# Patient Record
Sex: Female | Born: 1950 | Race: Black or African American | Hispanic: No | Marital: Single | Smoking: Never smoker
Health system: Southern US, Community
[De-identification: ages and names within clinical notes are randomized; demographics above are authoritative.]

## PROBLEM LIST (undated history)

## (undated) DIAGNOSIS — M5136 Other intervertebral disc degeneration, lumbar region: Secondary | ICD-10-CM

## (undated) DIAGNOSIS — C9000 Multiple myeloma not having achieved remission: Secondary | ICD-10-CM

## (undated) DIAGNOSIS — R238 Other skin changes: Secondary | ICD-10-CM

## (undated) DIAGNOSIS — C3490 Malignant neoplasm of unspecified part of unspecified bronchus or lung: Secondary | ICD-10-CM

## (undated) DIAGNOSIS — M51369 Other intervertebral disc degeneration, lumbar region without mention of lumbar back pain or lower extremity pain: Secondary | ICD-10-CM

## (undated) DIAGNOSIS — S5292XA Unspecified fracture of left forearm, initial encounter for closed fracture: Secondary | ICD-10-CM

## (undated) DIAGNOSIS — R233 Spontaneous ecchymoses: Secondary | ICD-10-CM

## (undated) DIAGNOSIS — K8020 Calculus of gallbladder without cholecystitis without obstruction: Secondary | ICD-10-CM

## (undated) DIAGNOSIS — E860 Dehydration: Secondary | ICD-10-CM

## (undated) DIAGNOSIS — K760 Fatty (change of) liver, not elsewhere classified: Secondary | ICD-10-CM

## (undated) DIAGNOSIS — J382 Nodules of vocal cords: Secondary | ICD-10-CM

## (undated) DIAGNOSIS — Z85038 Personal history of other malignant neoplasm of large intestine: Secondary | ICD-10-CM

## (undated) DIAGNOSIS — K219 Gastro-esophageal reflux disease without esophagitis: Secondary | ICD-10-CM

## (undated) DIAGNOSIS — Z860100 Personal history of colon polyps, unspecified: Secondary | ICD-10-CM

## (undated) DIAGNOSIS — B009 Herpesviral infection, unspecified: Secondary | ICD-10-CM

## (undated) DIAGNOSIS — G63 Polyneuropathy in diseases classified elsewhere: Secondary | ICD-10-CM

## (undated) DIAGNOSIS — E119 Type 2 diabetes mellitus without complications: Secondary | ICD-10-CM

## (undated) DIAGNOSIS — Z9484 Stem cells transplant status: Secondary | ICD-10-CM

## (undated) DIAGNOSIS — I10 Essential (primary) hypertension: Secondary | ICD-10-CM

## (undated) HISTORY — DX: Dehydration: E86.0

## (undated) HISTORY — PX: LIMBAL STEM CELL TRANSPLANT: SHX1969

## (undated) HISTORY — DX: Calculus of gallbladder without cholecystitis without obstruction: K80.20

## (undated) HISTORY — DX: Gastro-esophageal reflux disease without esophagitis: K21.9

## (undated) HISTORY — DX: Fatty (change of) liver, not elsewhere classified: K76.0

## (undated) HISTORY — DX: Herpesviral infection, unspecified: B00.9

## (undated) HISTORY — DX: Essential (primary) hypertension: I10

## (undated) HISTORY — DX: Hypercalcemia: E83.52

## (undated) HISTORY — DX: Unspecified fracture of left forearm, initial encounter for closed fracture: S52.92XA

## (undated) HISTORY — DX: Personal history of other malignant neoplasm of large intestine: Z85.038

## (undated) HISTORY — PX: CERVICAL CONE BIOPSY: SUR198

---

## 1981-12-31 HISTORY — PX: ABDOMINAL HYSTERECTOMY: SHX81

## 2001-05-01 ENCOUNTER — Other Ambulatory Visit: Admission: RE | Admit: 2001-05-01 | Discharge: 2001-05-01 | Payer: Self-pay | Admitting: Internal Medicine

## 2001-05-26 ENCOUNTER — Ambulatory Visit (HOSPITAL_COMMUNITY): Admission: RE | Admit: 2001-05-26 | Discharge: 2001-05-26 | Payer: Self-pay | Admitting: Internal Medicine

## 2001-05-26 ENCOUNTER — Encounter: Payer: Self-pay | Admitting: Internal Medicine

## 2002-01-14 ENCOUNTER — Emergency Department (HOSPITAL_COMMUNITY): Admission: EM | Admit: 2002-01-14 | Discharge: 2002-01-14 | Payer: Self-pay | Admitting: Emergency Medicine

## 2002-01-14 ENCOUNTER — Encounter: Payer: Self-pay | Admitting: Emergency Medicine

## 2002-11-09 ENCOUNTER — Ambulatory Visit (HOSPITAL_COMMUNITY): Admission: RE | Admit: 2002-11-09 | Discharge: 2002-11-09 | Payer: Self-pay | Admitting: Internal Medicine

## 2002-11-09 ENCOUNTER — Encounter: Payer: Self-pay | Admitting: Internal Medicine

## 2004-01-17 ENCOUNTER — Ambulatory Visit (HOSPITAL_COMMUNITY): Admission: RE | Admit: 2004-01-17 | Discharge: 2004-01-17 | Payer: Self-pay | Admitting: Internal Medicine

## 2005-03-01 ENCOUNTER — Ambulatory Visit: Payer: Self-pay | Admitting: Family Medicine

## 2005-03-02 ENCOUNTER — Observation Stay (HOSPITAL_COMMUNITY): Admission: AD | Admit: 2005-03-02 | Discharge: 2005-03-03 | Payer: Self-pay | Admitting: Internal Medicine

## 2005-03-02 ENCOUNTER — Encounter (HOSPITAL_COMMUNITY): Admission: RE | Admit: 2005-03-02 | Discharge: 2005-04-01 | Payer: Self-pay | Admitting: Family Medicine

## 2005-03-02 ENCOUNTER — Ambulatory Visit (HOSPITAL_COMMUNITY): Admission: RE | Admit: 2005-03-02 | Discharge: 2005-03-02 | Payer: Self-pay | Admitting: Family Medicine

## 2005-03-15 ENCOUNTER — Ambulatory Visit: Payer: Self-pay | Admitting: Family Medicine

## 2005-04-02 ENCOUNTER — Ambulatory Visit (HOSPITAL_COMMUNITY): Admission: RE | Admit: 2005-04-02 | Discharge: 2005-04-02 | Payer: Self-pay | Admitting: Internal Medicine

## 2005-04-02 ENCOUNTER — Ambulatory Visit: Payer: Self-pay | Admitting: Internal Medicine

## 2005-04-02 HISTORY — PX: ESOPHAGOGASTRODUODENOSCOPY: SHX1529

## 2005-04-04 ENCOUNTER — Ambulatory Visit (HOSPITAL_COMMUNITY): Admission: RE | Admit: 2005-04-04 | Discharge: 2005-04-04 | Payer: Self-pay | Admitting: Internal Medicine

## 2005-04-11 ENCOUNTER — Encounter: Admission: RE | Admit: 2005-04-11 | Discharge: 2005-04-11 | Payer: Self-pay | Admitting: General Surgery

## 2005-04-19 ENCOUNTER — Ambulatory Visit: Payer: Self-pay | Admitting: Family Medicine

## 2005-04-26 ENCOUNTER — Encounter (INDEPENDENT_AMBULATORY_CARE_PROVIDER_SITE_OTHER): Payer: Self-pay | Admitting: Specialist

## 2005-04-26 ENCOUNTER — Inpatient Hospital Stay (HOSPITAL_COMMUNITY): Admission: RE | Admit: 2005-04-26 | Discharge: 2005-05-02 | Payer: Self-pay | Admitting: General Surgery

## 2005-04-27 HISTORY — PX: HEMICOLECTOMY: SHX854

## 2005-06-25 ENCOUNTER — Ambulatory Visit: Payer: Self-pay | Admitting: Family Medicine

## 2005-06-27 ENCOUNTER — Encounter (HOSPITAL_COMMUNITY): Admission: RE | Admit: 2005-06-27 | Discharge: 2005-07-27 | Payer: Self-pay | Admitting: Family Medicine

## 2005-06-27 ENCOUNTER — Ambulatory Visit (HOSPITAL_COMMUNITY): Payer: Self-pay | Admitting: Family Medicine

## 2005-06-29 ENCOUNTER — Ambulatory Visit (HOSPITAL_COMMUNITY): Admission: RE | Admit: 2005-06-29 | Discharge: 2005-06-29 | Payer: Self-pay | Admitting: Family Medicine

## 2005-07-05 ENCOUNTER — Ambulatory Visit: Payer: Self-pay | Admitting: Hematology and Oncology

## 2005-07-06 ENCOUNTER — Ambulatory Visit: Payer: Self-pay | Admitting: Family Medicine

## 2005-07-27 ENCOUNTER — Encounter (INDEPENDENT_AMBULATORY_CARE_PROVIDER_SITE_OTHER): Payer: Self-pay | Admitting: Specialist

## 2005-07-27 ENCOUNTER — Other Ambulatory Visit: Admission: RE | Admit: 2005-07-27 | Discharge: 2005-07-27 | Payer: Self-pay | Admitting: Hematology and Oncology

## 2005-07-30 ENCOUNTER — Ambulatory Visit: Payer: Self-pay | Admitting: Family Medicine

## 2005-08-03 ENCOUNTER — Encounter (HOSPITAL_COMMUNITY): Admission: RE | Admit: 2005-08-03 | Discharge: 2005-09-26 | Payer: Self-pay | Admitting: Hematology and Oncology

## 2005-08-10 ENCOUNTER — Ambulatory Visit (HOSPITAL_COMMUNITY): Admission: RE | Admit: 2005-08-10 | Discharge: 2005-08-10 | Payer: Self-pay | Admitting: Hematology and Oncology

## 2005-08-22 ENCOUNTER — Ambulatory Visit: Payer: Self-pay | Admitting: Hematology and Oncology

## 2005-09-12 ENCOUNTER — Ambulatory Visit: Payer: Self-pay | Admitting: Family Medicine

## 2005-10-09 ENCOUNTER — Ambulatory Visit: Payer: Self-pay | Admitting: Hematology and Oncology

## 2005-11-25 ENCOUNTER — Ambulatory Visit: Payer: Self-pay | Admitting: Hematology and Oncology

## 2006-01-15 ENCOUNTER — Ambulatory Visit: Payer: Self-pay | Admitting: Family Medicine

## 2006-01-28 ENCOUNTER — Ambulatory Visit: Payer: Self-pay | Admitting: Hematology and Oncology

## 2006-03-04 ENCOUNTER — Ambulatory Visit: Payer: Self-pay | Admitting: Family Medicine

## 2006-03-12 ENCOUNTER — Ambulatory Visit: Payer: Self-pay | Admitting: Hematology and Oncology

## 2006-03-18 ENCOUNTER — Ambulatory Visit: Payer: Self-pay | Admitting: Family Medicine

## 2006-05-08 ENCOUNTER — Ambulatory Visit: Payer: Self-pay | Admitting: Hematology and Oncology

## 2006-05-10 LAB — COMPREHENSIVE METABOLIC PANEL
Albumin: 3.9 g/dL (ref 3.5–5.2)
BUN: 10 mg/dL (ref 6–23)
CO2: 24 mEq/L (ref 19–32)
Calcium: 9.3 mg/dL (ref 8.4–10.5)
Chloride: 103 mEq/L (ref 96–112)
Glucose, Bld: 125 mg/dL — ABNORMAL HIGH (ref 70–99)
Potassium: 3.3 mEq/L — ABNORMAL LOW (ref 3.5–5.3)
Sodium: 138 mEq/L (ref 135–145)
Total Protein: 6.3 g/dL (ref 6.0–8.3)

## 2006-05-10 LAB — CBC WITH DIFFERENTIAL/PLATELET
Basophils Absolute: 0.1 10*3/uL (ref 0.0–0.1)
EOS%: 1.7 % (ref 0.0–7.0)
Eosinophils Absolute: 0.1 10*3/uL (ref 0.0–0.5)
HCT: 28 % — ABNORMAL LOW (ref 34.8–46.6)
HGB: 9.5 g/dL — ABNORMAL LOW (ref 11.6–15.9)
MCH: 30.1 pg (ref 26.0–34.0)
NEUT#: 2.2 10*3/uL (ref 1.5–6.5)
NEUT%: 50.3 % (ref 39.6–76.8)
RDW: 19.9 % — ABNORMAL HIGH (ref 11.3–14.5)
lymph#: 1.1 10*3/uL (ref 0.9–3.3)

## 2006-05-10 LAB — MAGNESIUM: Magnesium: 1.7 mg/dL (ref 1.5–2.5)

## 2006-05-10 LAB — PHOSPHORUS: Phosphorus: 2.9 mg/dL (ref 2.3–4.6)

## 2006-05-15 ENCOUNTER — Ambulatory Visit (HOSPITAL_COMMUNITY): Admission: RE | Admit: 2006-05-15 | Discharge: 2006-05-15 | Payer: Self-pay | Admitting: Hematology and Oncology

## 2006-05-16 ENCOUNTER — Encounter: Admission: RE | Admit: 2006-05-16 | Discharge: 2006-08-14 | Payer: Self-pay | Admitting: Hematology and Oncology

## 2006-05-16 LAB — COMPREHENSIVE METABOLIC PANEL
AST: 14 U/L (ref 0–37)
Albumin: 3.9 g/dL (ref 3.5–5.2)
Alkaline Phosphatase: 41 U/L (ref 39–117)
BUN: 8 mg/dL (ref 6–23)
Calcium: 9.2 mg/dL (ref 8.4–10.5)
Chloride: 102 mEq/L (ref 96–112)
Potassium: 3.4 mEq/L — ABNORMAL LOW (ref 3.5–5.3)
Sodium: 139 mEq/L (ref 135–145)
Total Protein: 6.5 g/dL (ref 6.0–8.3)

## 2006-05-16 LAB — CBC WITH DIFFERENTIAL/PLATELET
Basophils Absolute: 0 10*3/uL (ref 0.0–0.1)
EOS%: 2.8 % (ref 0.0–7.0)
Eosinophils Absolute: 0.2 10*3/uL (ref 0.0–0.5)
HGB: 8.4 g/dL — ABNORMAL LOW (ref 11.6–15.9)
MCH: 31 pg (ref 26.0–34.0)
NEUT#: 3.8 10*3/uL (ref 1.5–6.5)
RBC: 2.71 10*6/uL — ABNORMAL LOW (ref 3.70–5.32)
RDW: 23.8 % — ABNORMAL HIGH (ref 11.3–14.5)
lymph#: 0.8 10*3/uL — ABNORMAL LOW (ref 0.9–3.3)

## 2006-05-23 LAB — CBC WITH DIFFERENTIAL/PLATELET
BASO%: 0.2 % (ref 0.0–2.0)
EOS%: 3 % (ref 0.0–7.0)
Eosinophils Absolute: 0.2 10*3/uL (ref 0.0–0.5)
LYMPH%: 16.6 % (ref 14.0–48.0)
MCH: 30.9 pg (ref 26.0–34.0)
MCHC: 33.8 g/dL (ref 32.0–36.0)
MCV: 91.6 fL (ref 81.0–101.0)
MONO%: 16.8 % — ABNORMAL HIGH (ref 0.0–13.0)
NEUT#: 3.6 10*3/uL (ref 1.5–6.5)
Platelets: 95 10*3/uL — ABNORMAL LOW (ref 145–400)
RBC: 3.59 10*6/uL — ABNORMAL LOW (ref 3.70–5.32)
RDW: 20.4 % — ABNORMAL HIGH (ref 11.3–14.5)

## 2006-05-23 LAB — COMPREHENSIVE METABOLIC PANEL
ALT: 19 U/L (ref 0–40)
AST: 20 U/L (ref 0–37)
Albumin: 3.9 g/dL (ref 3.5–5.2)
Alkaline Phosphatase: 42 U/L (ref 39–117)
Glucose, Bld: 138 mg/dL — ABNORMAL HIGH (ref 70–99)
Potassium: 3.2 mEq/L — ABNORMAL LOW (ref 3.5–5.3)
Sodium: 140 mEq/L (ref 135–145)
Total Bilirubin: 1 mg/dL (ref 0.3–1.2)
Total Protein: 6.4 g/dL (ref 6.0–8.3)

## 2006-05-23 LAB — PHOSPHORUS: Phosphorus: 2.5 mg/dL (ref 2.3–4.6)

## 2006-06-06 LAB — CBC WITH DIFFERENTIAL/PLATELET
BASO%: 0.3 % (ref 0.0–2.0)
EOS%: 2.1 % (ref 0.0–7.0)
HCT: 33.4 % — ABNORMAL LOW (ref 34.8–46.6)
MCH: 31.9 pg (ref 26.0–34.0)
MCHC: 33.7 g/dL (ref 32.0–36.0)
MONO#: 1 10*3/uL — ABNORMAL HIGH (ref 0.1–0.9)
NEUT%: 74.6 % (ref 39.6–76.8)
RDW: 22.2 % — ABNORMAL HIGH (ref 11.3–14.5)
WBC: 9 10*3/uL (ref 3.9–10.0)
lymph#: 1 10*3/uL (ref 0.9–3.3)

## 2006-06-06 LAB — COMPREHENSIVE METABOLIC PANEL
ALT: 18 U/L (ref 0–40)
AST: 20 U/L (ref 0–37)
Albumin: 4.3 g/dL (ref 3.5–5.2)
CO2: 26 mEq/L (ref 19–32)
Calcium: 9.2 mg/dL (ref 8.4–10.5)
Chloride: 105 mEq/L (ref 96–112)
Potassium: 3.5 mEq/L (ref 3.5–5.3)
Sodium: 142 mEq/L (ref 135–145)
Total Protein: 6.6 g/dL (ref 6.0–8.3)

## 2006-06-11 ENCOUNTER — Ambulatory Visit: Payer: Self-pay | Admitting: Family Medicine

## 2006-06-18 ENCOUNTER — Ambulatory Visit: Payer: Self-pay | Admitting: Hematology and Oncology

## 2006-06-20 LAB — COMPREHENSIVE METABOLIC PANEL
Alkaline Phosphatase: 36 U/L — ABNORMAL LOW (ref 39–117)
BUN: 10 mg/dL (ref 6–23)
Creatinine, Ser: 0.74 mg/dL (ref 0.40–1.20)
Glucose, Bld: 115 mg/dL — ABNORMAL HIGH (ref 70–99)
Sodium: 140 mEq/L (ref 135–145)
Total Bilirubin: 1.1 mg/dL (ref 0.3–1.2)
Total Protein: 6.5 g/dL (ref 6.0–8.3)

## 2006-06-20 LAB — PHOSPHORUS: Phosphorus: 3 mg/dL (ref 2.3–4.6)

## 2006-06-20 LAB — CBC WITH DIFFERENTIAL/PLATELET
Eosinophils Absolute: 0.2 10*3/uL (ref 0.0–0.5)
LYMPH%: 14.1 % (ref 14.0–48.0)
MCV: 94.7 fL (ref 81.0–101.0)
MONO%: 10 % (ref 0.0–13.0)
NEUT#: 5.9 10*3/uL (ref 1.5–6.5)
Platelets: 140 10*3/uL — ABNORMAL LOW (ref 145–400)
RBC: 3.61 10*6/uL — ABNORMAL LOW (ref 3.70–5.32)

## 2006-07-01 ENCOUNTER — Ambulatory Visit (HOSPITAL_COMMUNITY): Admission: RE | Admit: 2006-07-01 | Discharge: 2006-07-01 | Payer: Self-pay | Admitting: Family Medicine

## 2006-08-20 ENCOUNTER — Ambulatory Visit: Payer: Self-pay | Admitting: Hematology and Oncology

## 2006-08-26 LAB — CBC WITH DIFFERENTIAL/PLATELET
BASO%: 0.1 % (ref 0.0–2.0)
LYMPH%: 14.6 % (ref 14.0–48.0)
MCHC: 34.5 g/dL (ref 32.0–36.0)
MONO#: 0.7 10*3/uL (ref 0.1–0.9)
Platelets: 153 10*3/uL (ref 145–400)
RBC: 3.34 10*6/uL — ABNORMAL LOW (ref 3.70–5.32)
RDW: 14.1 % (ref 11.3–14.5)
WBC: 8.3 10*3/uL (ref 3.9–10.0)

## 2006-08-28 LAB — COMPREHENSIVE METABOLIC PANEL
AST: 18 U/L (ref 0–37)
Albumin: 4.3 g/dL (ref 3.5–5.2)
Alkaline Phosphatase: 47 U/L (ref 39–117)
BUN: 9 mg/dL (ref 6–23)
Potassium: 3.4 mEq/L — ABNORMAL LOW (ref 3.5–5.3)
Sodium: 142 mEq/L (ref 135–145)
Total Bilirubin: 0.9 mg/dL (ref 0.3–1.2)
Total Protein: 6.5 g/dL (ref 6.0–8.3)

## 2006-08-28 LAB — IMMUNOFIXATION ELECTROPHORESIS
IgG (Immunoglobin G), Serum: 587 mg/dL — ABNORMAL LOW (ref 694–1618)
IgM, Serum: 37 mg/dL — ABNORMAL LOW (ref 60–263)

## 2006-09-16 ENCOUNTER — Ambulatory Visit: Payer: Self-pay | Admitting: Family Medicine

## 2006-09-16 ENCOUNTER — Other Ambulatory Visit: Admission: RE | Admit: 2006-09-16 | Discharge: 2006-09-16 | Payer: Self-pay | Admitting: Family Medicine

## 2006-10-07 ENCOUNTER — Ambulatory Visit: Payer: Self-pay | Admitting: Internal Medicine

## 2006-10-07 ENCOUNTER — Ambulatory Visit (HOSPITAL_COMMUNITY): Admission: RE | Admit: 2006-10-07 | Discharge: 2006-10-07 | Payer: Self-pay | Admitting: Internal Medicine

## 2006-10-07 HISTORY — PX: COLONOSCOPY: SHX174

## 2006-10-07 LAB — HM COLONOSCOPY: HM Colonoscopy: NORMAL

## 2006-11-19 ENCOUNTER — Ambulatory Visit: Payer: Self-pay | Admitting: Hematology and Oncology

## 2006-11-19 LAB — COMPREHENSIVE METABOLIC PANEL
ALT: 21 U/L (ref 0–35)
Alkaline Phosphatase: 51 U/L (ref 39–117)
CO2: 30 mEq/L (ref 19–32)
Creatinine, Ser: 0.9 mg/dL (ref 0.40–1.20)
Sodium: 140 mEq/L (ref 135–145)
Total Bilirubin: 0.7 mg/dL (ref 0.3–1.2)
Total Protein: 6.6 g/dL (ref 6.0–8.3)

## 2006-11-19 LAB — CBC WITH DIFFERENTIAL/PLATELET
BASO%: 0.1 % (ref 0.0–2.0)
EOS%: 1.3 % (ref 0.0–7.0)
Eosinophils Absolute: 0.1 10*3/uL (ref 0.0–0.5)
LYMPH%: 17.6 % (ref 14.0–48.0)
MCH: 34.8 pg — ABNORMAL HIGH (ref 26.0–34.0)
MCHC: 34.3 g/dL (ref 32.0–36.0)
MCV: 101.4 fL — ABNORMAL HIGH (ref 81.0–101.0)
MONO%: 7.1 % (ref 0.0–13.0)
Platelets: 185 10*3/uL (ref 145–400)
RBC: 3.41 10*6/uL — ABNORMAL LOW (ref 3.70–5.32)

## 2006-11-20 ENCOUNTER — Ambulatory Visit: Payer: Self-pay | Admitting: Family Medicine

## 2006-11-27 ENCOUNTER — Ambulatory Visit (HOSPITAL_COMMUNITY): Admission: RE | Admit: 2006-11-27 | Discharge: 2006-11-27 | Payer: Self-pay | Admitting: Hematology and Oncology

## 2006-11-29 LAB — CBC WITH DIFFERENTIAL/PLATELET
Eosinophils Absolute: 0.1 10*3/uL (ref 0.0–0.5)
HCT: 36.3 % (ref 34.8–46.6)
LYMPH%: 22.3 % (ref 14.0–48.0)
MCHC: 34 g/dL (ref 32.0–36.0)
MCV: 102 fL — ABNORMAL HIGH (ref 81.0–101.0)
MONO#: 0.6 10*3/uL (ref 0.1–0.9)
MONO%: 6.9 % (ref 0.0–13.0)
NEUT#: 6.2 10*3/uL (ref 1.5–6.5)
NEUT%: 69 % (ref 39.6–76.8)
Platelets: 177 10*3/uL (ref 145–400)
WBC: 9 10*3/uL (ref 3.9–10.0)

## 2006-12-04 LAB — BETA 2 MICROGLOBULIN, SERUM: Beta-2 Microglobulin: 1.52 mg/L (ref 1.01–1.73)

## 2006-12-04 LAB — SPEP & IFE WITH QIG
Beta Globulin: 6.1 % (ref 4.7–7.2)
IgA: 249 mg/dL (ref 68–378)
IgG (Immunoglobin G), Serum: 780 mg/dL (ref 694–1618)
IgM, Serum: 56 mg/dL — ABNORMAL LOW (ref 60–263)
Total Protein, Serum Electrophoresis: 6.7 g/dL (ref 6.0–8.3)

## 2006-12-04 LAB — COMPREHENSIVE METABOLIC PANEL
Alkaline Phosphatase: 51 U/L (ref 39–117)
BUN: 18 mg/dL (ref 6–23)
CO2: 30 mEq/L (ref 19–32)
Creatinine, Ser: 0.78 mg/dL (ref 0.40–1.20)
Glucose, Bld: 96 mg/dL (ref 70–99)
Total Bilirubin: 0.7 mg/dL (ref 0.3–1.2)

## 2007-01-09 ENCOUNTER — Ambulatory Visit: Payer: Self-pay | Admitting: Family Medicine

## 2007-01-21 ENCOUNTER — Ambulatory Visit (HOSPITAL_COMMUNITY): Admission: RE | Admit: 2007-01-21 | Discharge: 2007-01-21 | Payer: Self-pay | Admitting: Family Medicine

## 2007-01-21 ENCOUNTER — Ambulatory Visit: Payer: Self-pay | Admitting: Family Medicine

## 2007-01-21 LAB — CONVERTED CEMR LAB
Basophils Relative: 0 % (ref 0–1)
Eosinophils Absolute: 0.2 10*3/uL (ref 0.0–0.7)
HCT: 39.7 % (ref 36.0–46.0)
Hemoglobin: 12.8 g/dL (ref 12.0–15.0)
Lymphs Abs: 2.8 10*3/uL (ref 0.7–3.3)
MCHC: 32.2 g/dL (ref 30.0–36.0)
MCV: 101.8 fL — ABNORMAL HIGH (ref 78.0–100.0)
Monocytes Absolute: 0.6 10*3/uL (ref 0.2–0.7)
Monocytes Relative: 6 % (ref 3–11)
Neutrophils Relative %: 61 % (ref 43–77)
RBC: 3.9 M/uL (ref 3.87–5.11)
WBC: 9.2 10*3/uL (ref 4.0–10.5)

## 2007-03-11 ENCOUNTER — Ambulatory Visit: Payer: Self-pay | Admitting: Hematology and Oncology

## 2007-03-14 LAB — CBC WITH DIFFERENTIAL/PLATELET
BASO%: 0.4 % (ref 0.0–2.0)
LYMPH%: 20.4 % (ref 14.0–48.0)
MCHC: 33.8 g/dL (ref 32.0–36.0)
MCV: 101 fL (ref 81.0–101.0)
MONO#: 0.6 10*3/uL (ref 0.1–0.9)
MONO%: 6.2 % (ref 0.0–13.0)
Platelets: 177 10*3/uL (ref 145–400)
RBC: 3.64 10*6/uL — ABNORMAL LOW (ref 3.70–5.32)
RDW: 13.5 % (ref 11.3–14.5)
WBC: 8.9 10*3/uL (ref 3.9–10.0)

## 2007-03-18 LAB — SPEP & IFE WITH QIG
Beta 2: 3.7 % (ref 3.2–6.5)
Beta Globulin: 11.3 % — ABNORMAL HIGH (ref 4.7–7.2)
IgA: 515 mg/dL — ABNORMAL HIGH (ref 68–378)
IgG (Immunoglobin G), Serum: 901 mg/dL (ref 694–1618)
IgM, Serum: 50 mg/dL — ABNORMAL LOW (ref 60–263)
M-Spike, %: 0.28 g/dL
Total Protein, Serum Electrophoresis: 7.1 g/dL (ref 6.0–8.3)

## 2007-03-18 LAB — COMPREHENSIVE METABOLIC PANEL
ALT: 25 U/L (ref 0–35)
Alkaline Phosphatase: 63 U/L (ref 39–117)
Sodium: 140 mEq/L (ref 135–145)
Total Bilirubin: 1 mg/dL (ref 0.3–1.2)
Total Protein: 7.1 g/dL (ref 6.0–8.3)

## 2007-03-20 ENCOUNTER — Ambulatory Visit: Payer: Self-pay | Admitting: Family Medicine

## 2007-03-24 ENCOUNTER — Ambulatory Visit (HOSPITAL_COMMUNITY): Admission: RE | Admit: 2007-03-24 | Discharge: 2007-03-24 | Payer: Self-pay | Admitting: Family Medicine

## 2007-04-04 ENCOUNTER — Ambulatory Visit: Payer: Self-pay | Admitting: Family Medicine

## 2007-05-05 ENCOUNTER — Ambulatory Visit: Payer: Self-pay | Admitting: Family Medicine

## 2007-06-23 ENCOUNTER — Encounter: Payer: Self-pay | Admitting: Family Medicine

## 2007-06-23 LAB — CONVERTED CEMR LAB
BUN: 17 mg/dL (ref 6–23)
Cholesterol: 169 mg/dL (ref 0–200)
Creatinine, Ser: 0.71 mg/dL (ref 0.40–1.20)
Glucose, Bld: 100 mg/dL — ABNORMAL HIGH (ref 70–99)
LDL Cholesterol: 92 mg/dL (ref 0–99)
Potassium: 4.1 meq/L (ref 3.5–5.3)
Triglycerides: 129 mg/dL (ref <150)
VLDL: 26 mg/dL (ref 0–40)

## 2007-07-03 ENCOUNTER — Ambulatory Visit (HOSPITAL_COMMUNITY): Admission: RE | Admit: 2007-07-03 | Discharge: 2007-07-03 | Payer: Self-pay | Admitting: Family Medicine

## 2007-08-13 ENCOUNTER — Ambulatory Visit: Payer: Self-pay | Admitting: Hematology and Oncology

## 2007-08-15 LAB — CBC WITH DIFFERENTIAL/PLATELET
BASO%: 0.2 % (ref 0.0–2.0)
EOS%: 1 % (ref 0.0–7.0)
LYMPH%: 19.9 % (ref 14.0–48.0)
MCH: 34.8 pg — ABNORMAL HIGH (ref 26.0–34.0)
MCHC: 34.6 g/dL (ref 32.0–36.0)
MCV: 100.6 fL (ref 81.0–101.0)
MONO%: 5.7 % (ref 0.0–13.0)
Platelets: 173 10*3/uL (ref 145–400)
RBC: 3.42 10*6/uL — ABNORMAL LOW (ref 3.70–5.32)

## 2007-08-19 LAB — PROTEIN ELECTROPHORESIS, SERUM
Alpha-2-Globulin: 8.6 % (ref 7.1–11.8)
Gamma Globulin: 7.9 % — ABNORMAL LOW (ref 11.1–18.8)
M-Spike, %: 1.53 g/dL

## 2007-08-19 LAB — IGG, IGA, IGM: IgG (Immunoglobin G), Serum: 707 mg/dL (ref 694–1618)

## 2007-08-19 LAB — LACTATE DEHYDROGENASE: LDH: 150 U/L (ref 94–250)

## 2007-09-05 ENCOUNTER — Ambulatory Visit (HOSPITAL_COMMUNITY): Admission: RE | Admit: 2007-09-05 | Discharge: 2007-09-05 | Payer: Self-pay | Admitting: Hematology and Oncology

## 2007-09-22 ENCOUNTER — Ambulatory Visit: Payer: Self-pay | Admitting: Hematology and Oncology

## 2007-09-22 LAB — CBC WITH DIFFERENTIAL/PLATELET
BASO%: 0 % (ref 0.0–2.0)
LYMPH%: 20.2 % (ref 14.0–48.0)
MCHC: 34.5 g/dL (ref 32.0–36.0)
MCV: 99.3 fL (ref 81.0–101.0)
MONO%: 6.5 % (ref 0.0–13.0)
Platelets: 140 10*3/uL — ABNORMAL LOW (ref 145–400)
RBC: 3.19 10*6/uL — ABNORMAL LOW (ref 3.70–5.32)
WBC: 8.4 10*3/uL (ref 3.9–10.0)

## 2007-09-23 ENCOUNTER — Ambulatory Visit: Payer: Self-pay | Admitting: Family Medicine

## 2007-09-23 LAB — IGG, IGA, IGM: IgG (Immunoglobin G), Serum: 647 mg/dL — ABNORMAL LOW (ref 694–1618)

## 2007-09-30 LAB — UIFE/LIGHT CHAINS/TP QN, 24-HR UR
Free Kappa Lt Chains,Ur: 0.19 mg/dL (ref 0.04–1.51)
Free Lambda Lt Chains,Ur: <0.08 mg/dL (ref 0.08–1.01)
Time: 24 hours
Volume, Urine: 2600 mL

## 2007-10-07 ENCOUNTER — Other Ambulatory Visit: Admission: RE | Admit: 2007-10-07 | Discharge: 2007-10-07 | Payer: Self-pay | Admitting: Hematology and Oncology

## 2007-10-07 ENCOUNTER — Encounter: Payer: Self-pay | Admitting: Hematology and Oncology

## 2007-10-07 LAB — CBC WITH DIFFERENTIAL/PLATELET
Eosinophils Absolute: 0.1 10*3/uL (ref 0.0–0.5)
HCT: 30.9 % — ABNORMAL LOW (ref 34.8–46.6)
LYMPH%: 25.1 % (ref 14.0–48.0)
MCHC: 34 g/dL (ref 32.0–36.0)
MCV: 99.8 fL (ref 81.0–101.0)
MONO%: 7 % (ref 0.0–13.0)
NEUT#: 5.2 10*3/uL (ref 1.5–6.5)
NEUT%: 66.2 % (ref 39.6–76.8)
Platelets: 143 10*3/uL — ABNORMAL LOW (ref 145–400)
RBC: 3.1 10*6/uL — ABNORMAL LOW (ref 3.70–5.32)

## 2007-10-15 ENCOUNTER — Ambulatory Visit: Payer: Self-pay | Admitting: Family Medicine

## 2007-10-15 ENCOUNTER — Other Ambulatory Visit: Admission: RE | Admit: 2007-10-15 | Discharge: 2007-10-15 | Payer: Self-pay | Admitting: Family Medicine

## 2007-10-16 ENCOUNTER — Encounter: Payer: Self-pay | Admitting: Family Medicine

## 2007-10-27 ENCOUNTER — Ambulatory Visit (HOSPITAL_COMMUNITY): Admission: RE | Admit: 2007-10-27 | Discharge: 2007-10-27 | Payer: Self-pay | Admitting: Hematology and Oncology

## 2007-11-07 ENCOUNTER — Ambulatory Visit: Payer: Self-pay | Admitting: Hematology and Oncology

## 2007-11-11 LAB — COMPREHENSIVE METABOLIC PANEL
ALT: 36 U/L — ABNORMAL HIGH (ref 0–35)
AST: 24 U/L (ref 0–37)
Alkaline Phosphatase: 98 U/L (ref 39–117)
Creatinine, Ser: 0.85 mg/dL (ref 0.40–1.20)
Sodium: 139 mEq/L (ref 135–145)
Total Bilirubin: 0.7 mg/dL (ref 0.3–1.2)
Total Protein: 8.4 g/dL — ABNORMAL HIGH (ref 6.0–8.3)

## 2007-11-11 LAB — CBC WITH DIFFERENTIAL/PLATELET
BASO%: 0.1 % (ref 0.0–2.0)
EOS%: 0.8 % (ref 0.0–7.0)
HCT: 28.8 % — ABNORMAL LOW (ref 34.8–46.6)
LYMPH%: 12.8 % — ABNORMAL LOW (ref 14.0–48.0)
MCH: 33 pg (ref 26.0–34.0)
MCHC: 33.6 g/dL (ref 32.0–36.0)
MCV: 98.2 fL (ref 81.0–101.0)
MONO#: 0.3 10*3/uL (ref 0.1–0.9)
MONO%: 2.7 % (ref 0.0–13.0)
NEUT%: 83.6 % — ABNORMAL HIGH (ref 39.6–76.8)
Platelets: 124 10*3/uL — ABNORMAL LOW (ref 145–400)
RBC: 2.93 10*6/uL — ABNORMAL LOW (ref 3.70–5.32)
WBC: 12.4 10*3/uL — ABNORMAL HIGH (ref 3.9–10.0)

## 2007-11-14 LAB — CBC WITH DIFFERENTIAL/PLATELET
BASO%: 0.7 % (ref 0.0–2.0)
HCT: 28.8 % — ABNORMAL LOW (ref 34.8–46.6)
LYMPH%: 20.3 % (ref 14.0–48.0)
MCHC: 34.3 g/dL (ref 32.0–36.0)
MCV: 99.5 fL (ref 81.0–101.0)
MONO#: 0.6 10*3/uL (ref 0.1–0.9)
MONO%: 4.5 % (ref 0.0–13.0)
NEUT%: 74.3 % (ref 39.6–76.8)
Platelets: 140 10*3/uL — ABNORMAL LOW (ref 145–400)
RBC: 2.9 10*6/uL — ABNORMAL LOW (ref 3.70–5.32)
WBC: 14 10*3/uL — ABNORMAL HIGH (ref 3.9–10.0)

## 2007-11-14 LAB — TECHNOLOGIST REVIEW

## 2007-11-18 LAB — CBC WITH DIFFERENTIAL/PLATELET
BASO%: 0.3 % (ref 0.0–2.0)
Basophils Absolute: 0.1 10*3/uL (ref 0.0–0.1)
Eosinophils Absolute: 0.1 10*3/uL (ref 0.0–0.5)
HCT: 30.7 % — ABNORMAL LOW (ref 34.8–46.6)
HGB: 10.3 g/dL — ABNORMAL LOW (ref 11.6–15.9)
LYMPH%: 14 % (ref 14.0–48.0)
MCHC: 33.5 g/dL (ref 32.0–36.0)
MONO#: 0.3 10*3/uL (ref 0.1–0.9)
NEUT#: 12.5 10*3/uL — ABNORMAL HIGH (ref 1.5–6.5)
NEUT%: 83.4 % — ABNORMAL HIGH (ref 39.6–76.8)
Platelets: 95 10*3/uL — ABNORMAL LOW (ref 145–400)
WBC: 15 10*3/uL — ABNORMAL HIGH (ref 3.9–10.0)
lymph#: 2.1 10*3/uL (ref 0.9–3.3)

## 2007-11-21 LAB — CBC WITH DIFFERENTIAL/PLATELET
Basophils Absolute: 0.6 10*3/uL — ABNORMAL HIGH (ref 0.0–0.1)
Eosinophils Absolute: 0 10*3/uL (ref 0.0–0.5)
HCT: 31.3 % — ABNORMAL LOW (ref 34.8–46.6)
HGB: 10.6 g/dL — ABNORMAL LOW (ref 11.6–15.9)
MCH: 34 pg (ref 26.0–34.0)
MCV: 100 fL (ref 81.0–101.0)
MONO%: 2.1 % (ref 0.0–13.0)
NEUT#: 14.1 10*3/uL — ABNORMAL HIGH (ref 1.5–6.5)
NEUT%: 83.3 % — ABNORMAL HIGH (ref 39.6–76.8)
RDW: 16.4 % — ABNORMAL HIGH (ref 11.3–14.5)

## 2007-12-02 LAB — CBC WITH DIFFERENTIAL/PLATELET
Basophils Absolute: 0 10*3/uL (ref 0.0–0.1)
EOS%: 0.5 % (ref 0.0–7.0)
Eosinophils Absolute: 0.1 10*3/uL (ref 0.0–0.5)
HGB: 9.8 g/dL — ABNORMAL LOW (ref 11.6–15.9)
LYMPH%: 12 % — ABNORMAL LOW (ref 14.0–48.0)
MCH: 33 pg (ref 26.0–34.0)
MCV: 98.4 fL (ref 81.0–101.0)
MONO%: 1.9 % (ref 0.0–13.0)
NEUT#: 10.2 10*3/uL — ABNORMAL HIGH (ref 1.5–6.5)
Platelets: 122 10*3/uL — ABNORMAL LOW (ref 145–400)
RBC: 2.97 10*6/uL — ABNORMAL LOW (ref 3.70–5.32)

## 2007-12-02 LAB — COMPREHENSIVE METABOLIC PANEL
Alkaline Phosphatase: 78 U/L (ref 39–117)
BUN: 17 mg/dL (ref 6–23)
Glucose, Bld: 177 mg/dL — ABNORMAL HIGH (ref 70–99)
Total Bilirubin: 0.5 mg/dL (ref 0.3–1.2)

## 2007-12-05 LAB — CBC WITH DIFFERENTIAL/PLATELET
BASO%: 0.6 % (ref 0.0–2.0)
EOS%: 0.4 % (ref 0.0–7.0)
HCT: 29.9 % — ABNORMAL LOW (ref 34.8–46.6)
MCH: 33.3 pg (ref 26.0–34.0)
MCHC: 33.7 g/dL (ref 32.0–36.0)
NEUT%: 79 % — ABNORMAL HIGH (ref 39.6–76.8)
RBC: 3.03 10*6/uL — ABNORMAL LOW (ref 3.70–5.32)
lymph#: 2.1 10*3/uL (ref 0.9–3.3)

## 2007-12-05 LAB — TECHNOLOGIST REVIEW

## 2007-12-09 LAB — CBC WITH DIFFERENTIAL/PLATELET
EOS%: 0.4 % (ref 0.0–7.0)
Eosinophils Absolute: 0 10*3/uL (ref 0.0–0.5)
MCH: 34 pg (ref 26.0–34.0)
MCV: 100.8 fL (ref 81.0–101.0)
MONO%: 0.9 % (ref 0.0–13.0)
NEUT#: 10.4 10*3/uL — ABNORMAL HIGH (ref 1.5–6.5)
RBC: 2.98 10*6/uL — ABNORMAL LOW (ref 3.70–5.32)
RDW: 20.1 % — ABNORMAL HIGH (ref 11.3–14.5)

## 2007-12-12 LAB — CBC WITH DIFFERENTIAL/PLATELET
Eosinophils Absolute: 0.1 10*3/uL (ref 0.0–0.5)
MONO#: 0.4 10*3/uL (ref 0.1–0.9)
NEUT#: 9.1 10*3/uL — ABNORMAL HIGH (ref 1.5–6.5)
RBC: 3 10*6/uL — ABNORMAL LOW (ref 3.70–5.32)
RDW: 16.4 % — ABNORMAL HIGH (ref 11.3–14.5)
WBC: 11 10*3/uL — ABNORMAL HIGH (ref 3.9–10.0)
lymph#: 1.4 10*3/uL (ref 0.9–3.3)

## 2007-12-19 ENCOUNTER — Ambulatory Visit: Payer: Self-pay | Admitting: Hematology and Oncology

## 2007-12-23 LAB — CBC WITH DIFFERENTIAL/PLATELET
Eosinophils Absolute: 0 10*3/uL (ref 0.0–0.5)
HCT: 31.6 % — ABNORMAL LOW (ref 34.8–46.6)
LYMPH%: 8.9 % — ABNORMAL LOW (ref 14.0–48.0)
MONO#: 0.3 10*3/uL (ref 0.1–0.9)
NEUT#: 13.1 10*3/uL — ABNORMAL HIGH (ref 1.5–6.5)
NEUT%: 88.4 % — ABNORMAL HIGH (ref 39.6–76.8)
Platelets: 116 10*3/uL — ABNORMAL LOW (ref 145–400)
RBC: 3.1 10*6/uL — ABNORMAL LOW (ref 3.70–5.32)
WBC: 14.8 10*3/uL — ABNORMAL HIGH (ref 3.9–10.0)

## 2007-12-23 LAB — COMPREHENSIVE METABOLIC PANEL
CO2: 23 mEq/L (ref 19–32)
Calcium: 9.2 mg/dL (ref 8.4–10.5)
Glucose, Bld: 159 mg/dL — ABNORMAL HIGH (ref 70–99)
Sodium: 141 mEq/L (ref 135–145)
Total Bilirubin: 0.6 mg/dL (ref 0.3–1.2)
Total Protein: 6.8 g/dL (ref 6.0–8.3)

## 2007-12-26 LAB — CBC WITH DIFFERENTIAL/PLATELET
BASO%: 0 % (ref 0.0–2.0)
Eosinophils Absolute: 0 10*3/uL (ref 0.0–0.5)
LYMPH%: 11.5 % — ABNORMAL LOW (ref 14.0–48.0)
MCHC: 33.4 g/dL (ref 32.0–36.0)
MONO#: 0.2 10*3/uL (ref 0.1–0.9)
NEUT#: 12.7 10*3/uL — ABNORMAL HIGH (ref 1.5–6.5)
Platelets: 113 10*3/uL — ABNORMAL LOW (ref 145–400)
RBC: 2.99 10*6/uL — ABNORMAL LOW (ref 3.70–5.32)
RDW: 18.9 % — ABNORMAL HIGH (ref 11.3–14.5)
WBC: 14.6 10*3/uL — ABNORMAL HIGH (ref 3.9–10.0)
lymph#: 1.7 10*3/uL (ref 0.9–3.3)

## 2007-12-30 LAB — CBC WITH DIFFERENTIAL/PLATELET
Basophils Absolute: 0 10*3/uL (ref 0.0–0.1)
Eosinophils Absolute: 0.1 10*3/uL (ref 0.0–0.5)
HCT: 31.4 % — ABNORMAL LOW (ref 34.8–46.6)
HGB: 10.7 g/dL — ABNORMAL LOW (ref 11.6–15.9)
LYMPH%: 9.2 % — ABNORMAL LOW (ref 14.0–48.0)
MONO#: 0.2 10*3/uL (ref 0.1–0.9)
NEUT#: 12.2 10*3/uL — ABNORMAL HIGH (ref 1.5–6.5)
NEUT%: 88.5 % — ABNORMAL HIGH (ref 39.6–76.8)
Platelets: 98 10*3/uL — ABNORMAL LOW (ref 145–400)
WBC: 13.8 10*3/uL — ABNORMAL HIGH (ref 3.9–10.0)
lymph#: 1.3 10*3/uL (ref 0.9–3.3)

## 2008-01-02 LAB — CBC WITH DIFFERENTIAL/PLATELET
Basophils Absolute: 0.1 10*3/uL (ref 0.0–0.1)
EOS%: 0.2 % (ref 0.0–7.0)
Eosinophils Absolute: 0 10*3/uL (ref 0.0–0.5)
HCT: 33.1 % — ABNORMAL LOW (ref 34.8–46.6)
HGB: 10.8 g/dL — ABNORMAL LOW (ref 11.6–15.9)
MCH: 33.2 pg (ref 26.0–34.0)
MCV: 102 fL — ABNORMAL HIGH (ref 81.0–101.0)
MONO%: 2.5 % (ref 0.0–13.0)
NEUT#: 15.3 10*3/uL — ABNORMAL HIGH (ref 1.5–6.5)
NEUT%: 85 % — ABNORMAL HIGH (ref 39.6–76.8)
RDW: 16.1 % — ABNORMAL HIGH (ref 11.3–14.5)

## 2008-01-07 LAB — CBC WITH DIFFERENTIAL/PLATELET
Basophils Absolute: 0 10*3/uL (ref 0.0–0.1)
EOS%: 0.4 % (ref 0.0–7.0)
Eosinophils Absolute: 0.1 10*3/uL (ref 0.0–0.5)
HGB: 10.8 g/dL — ABNORMAL LOW (ref 11.6–15.9)
LYMPH%: 14 % (ref 14.0–48.0)
MCH: 33.7 pg (ref 26.0–34.0)
MCV: 105 fL — ABNORMAL HIGH (ref 81.0–101.0)
MONO%: 6 % (ref 0.0–13.0)
NEUT#: 10.5 10*3/uL — ABNORMAL HIGH (ref 1.5–6.5)
Platelets: 70 10*3/uL — ABNORMAL LOW (ref 145–400)
RDW: 16.6 % — ABNORMAL HIGH (ref 11.3–14.5)

## 2008-01-13 LAB — MANUAL DIFFERENTIAL
ANC (CHCC manual diff): 12.6 10*3/uL — ABNORMAL HIGH (ref 1.5–6.5)
PLT EST: DECREASED

## 2008-01-13 LAB — COMPREHENSIVE METABOLIC PANEL
ALT: 109 U/L — ABNORMAL HIGH (ref 0–35)
AST: 29 U/L (ref 0–37)
Albumin: 4.1 g/dL (ref 3.5–5.2)
Alkaline Phosphatase: 81 U/L (ref 39–117)
Potassium: 4.2 mEq/L (ref 3.5–5.3)
Sodium: 139 mEq/L (ref 135–145)
Total Protein: 6.9 g/dL (ref 6.0–8.3)

## 2008-01-13 LAB — CBC WITH DIFFERENTIAL/PLATELET
HGB: 11.2 g/dL — ABNORMAL LOW (ref 11.6–15.9)
MCH: 33.3 pg (ref 26.0–34.0)
MCHC: 32.6 g/dL (ref 32.0–36.0)
RDW: 15.5 % — ABNORMAL HIGH (ref 11.3–14.5)

## 2008-01-26 LAB — CBC WITH DIFFERENTIAL/PLATELET
Basophils Absolute: 0 10*3/uL (ref 0.0–0.1)
EOS%: 0.2 % (ref 0.0–7.0)
HGB: 11.9 g/dL (ref 11.6–15.9)
MCH: 33.7 pg (ref 26.0–34.0)
MCV: 101.9 fL — ABNORMAL HIGH (ref 81.0–101.0)
MONO%: 9 % (ref 0.0–13.0)
RBC: 3.54 10*6/uL — ABNORMAL LOW (ref 3.70–5.32)
RDW: 18.5 % — ABNORMAL HIGH (ref 11.3–14.5)

## 2008-01-30 LAB — COMPREHENSIVE METABOLIC PANEL
AST: 15 U/L (ref 0–37)
Albumin: 4 g/dL (ref 3.5–5.2)
Alkaline Phosphatase: 55 U/L (ref 39–117)
BUN: 19 mg/dL (ref 6–23)
Potassium: 4 mEq/L (ref 3.5–5.3)

## 2008-01-30 LAB — IMMUNOFIXATION ELECTROPHORESIS
IgG (Immunoglobin G), Serum: 459 mg/dL — ABNORMAL LOW (ref 694–1618)
IgM, Serum: 53 mg/dL — ABNORMAL LOW (ref 60–263)
Total Protein, Serum Electrophoresis: 6.3 g/dL (ref 6.0–8.3)

## 2008-01-30 LAB — KAPPA/LAMBDA LIGHT CHAINS: Kappa free light chain: <0.54 mg/dL (ref 0.33–1.94)

## 2008-02-02 ENCOUNTER — Ambulatory Visit: Payer: Self-pay | Admitting: Family Medicine

## 2008-02-06 ENCOUNTER — Ambulatory Visit: Payer: Self-pay | Admitting: Hematology and Oncology

## 2008-02-10 LAB — COMPREHENSIVE METABOLIC PANEL
Albumin: 4.4 g/dL (ref 3.5–5.2)
BUN: 12 mg/dL (ref 6–23)
CO2: 23 mEq/L (ref 19–32)
Calcium: 9.8 mg/dL (ref 8.4–10.5)
Chloride: 100 mEq/L (ref 96–112)
Glucose, Bld: 217 mg/dL — ABNORMAL HIGH (ref 70–99)
Potassium: 4 mEq/L (ref 3.5–5.3)
Sodium: 135 mEq/L (ref 135–145)
Total Protein: 7.1 g/dL (ref 6.0–8.3)

## 2008-02-10 LAB — CBC WITH DIFFERENTIAL/PLATELET
Basophils Absolute: 0 10*3/uL (ref 0.0–0.1)
EOS%: 0.2 % (ref 0.0–7.0)
Eosinophils Absolute: 0 10*3/uL (ref 0.0–0.5)
HGB: 11.3 g/dL — ABNORMAL LOW (ref 11.6–15.9)
MONO%: 1.8 % (ref 0.0–13.0)
NEUT#: 7.6 10*3/uL — ABNORMAL HIGH (ref 1.5–6.5)
RBC: 3.41 10*6/uL — ABNORMAL LOW (ref 3.70–5.32)
RDW: 13.5 % (ref 11.3–14.5)
lymph#: 0.9 10*3/uL (ref 0.9–3.3)

## 2008-02-13 LAB — CBC WITH DIFFERENTIAL/PLATELET
BASO%: 0.4 % (ref 0.0–2.0)
Eosinophils Absolute: 0 10*3/uL (ref 0.0–0.5)
LYMPH%: 14.7 % (ref 14.0–48.0)
MCHC: 34.3 g/dL (ref 32.0–36.0)
MCV: 96.7 fL (ref 81.0–101.0)
MONO#: 0.4 10*3/uL (ref 0.1–0.9)
NEUT%: 81.5 % — ABNORMAL HIGH (ref 39.6–76.8)
lymph#: 1.9 10*3/uL (ref 0.9–3.3)

## 2008-02-17 LAB — CBC WITH DIFFERENTIAL/PLATELET
Basophils Absolute: 0 10*3/uL (ref 0.0–0.1)
Eosinophils Absolute: 0 10*3/uL (ref 0.0–0.5)
HCT: 35.1 % (ref 34.8–46.6)
HGB: 11.8 g/dL (ref 11.6–15.9)
MCV: 99.1 fL (ref 81.0–101.0)
MONO%: 1.7 % (ref 0.0–13.0)
NEUT#: 12.5 10*3/uL — ABNORMAL HIGH (ref 1.5–6.5)
NEUT%: 87.8 % — ABNORMAL HIGH (ref 39.6–76.8)
RDW: 16.5 % — ABNORMAL HIGH (ref 11.3–14.5)
lymph#: 1.4 10*3/uL (ref 0.9–3.3)

## 2008-02-20 LAB — CBC WITH DIFFERENTIAL/PLATELET
Basophils Absolute: 0 10*3/uL (ref 0.0–0.1)
Eosinophils Absolute: 0 10*3/uL (ref 0.0–0.5)
HGB: 11.8 g/dL (ref 11.6–15.9)
LYMPH%: 11.6 % — ABNORMAL LOW (ref 14.0–48.0)
MCH: 34 pg (ref 26.0–34.0)
MCV: 99.8 fL (ref 81.0–101.0)
MONO%: 1.6 % (ref 0.0–13.0)
NEUT#: 13.3 10*3/uL — ABNORMAL HIGH (ref 1.5–6.5)
Platelets: 102 10*3/uL — ABNORMAL LOW (ref 145–400)

## 2008-03-02 LAB — CBC WITH DIFFERENTIAL/PLATELET
BASO%: 0.2 % (ref 0.0–2.0)
Eosinophils Absolute: 0 10*3/uL (ref 0.0–0.5)
HCT: 37.8 % (ref 34.8–46.6)
HGB: 12.4 g/dL (ref 11.6–15.9)
LYMPH%: 8 % — ABNORMAL LOW (ref 14.0–48.0)
MONO#: 0.2 10*3/uL (ref 0.1–0.9)
NEUT#: 14 10*3/uL — ABNORMAL HIGH (ref 1.5–6.5)
NEUT%: 90.1 % — ABNORMAL HIGH (ref 39.6–76.8)
Platelets: 144 10*3/uL — ABNORMAL LOW (ref 145–400)
WBC: 15.6 10*3/uL — ABNORMAL HIGH (ref 3.9–10.0)
lymph#: 1.3 10*3/uL (ref 0.9–3.3)

## 2008-03-08 ENCOUNTER — Encounter: Payer: Self-pay | Admitting: Family Medicine

## 2008-03-08 LAB — CONVERTED CEMR LAB
BUN: 22 mg/dL (ref 6–23)
CO2: 24 meq/L (ref 19–32)
Chloride: 105 meq/L (ref 96–112)
Creatinine, Ser: 0.86 mg/dL (ref 0.40–1.20)
Potassium: 3.8 meq/L (ref 3.5–5.3)

## 2008-03-09 LAB — COMPREHENSIVE METABOLIC PANEL
ALT: 52 U/L — ABNORMAL HIGH (ref 0–35)
AST: 40 U/L — ABNORMAL HIGH (ref 0–37)
Albumin: 4.2 g/dL (ref 3.5–5.2)
Alkaline Phosphatase: 71 U/L (ref 39–117)
Calcium: 9 mg/dL (ref 8.4–10.5)
Chloride: 105 mEq/L (ref 96–112)
Creatinine, Ser: 0.72 mg/dL (ref 0.40–1.20)
Potassium: 4.3 mEq/L (ref 3.5–5.3)

## 2008-03-09 LAB — CBC WITH DIFFERENTIAL/PLATELET
BASO%: 1.5 % (ref 0.0–2.0)
Basophils Absolute: 0.2 10*3/uL — ABNORMAL HIGH (ref 0.0–0.1)
EOS%: 0.1 % (ref 0.0–7.0)
HCT: 35.7 % (ref 34.8–46.6)
HGB: 12.2 g/dL (ref 11.6–15.9)
LYMPH%: 8.7 % — ABNORMAL LOW (ref 14.0–48.0)
MCH: 34.4 pg — ABNORMAL HIGH (ref 26.0–34.0)
MCHC: 34.2 g/dL (ref 32.0–36.0)
MONO#: 0.2 10*3/uL (ref 0.1–0.9)
NEUT%: 87.9 % — ABNORMAL HIGH (ref 39.6–76.8)
Platelets: 127 10*3/uL — ABNORMAL LOW (ref 145–400)
lymph#: 1 10*3/uL (ref 0.9–3.3)

## 2008-03-10 LAB — BETA 2 MICROGLOBULIN, SERUM: Beta-2 Microglobulin: 1.33 mg/L (ref 1.01–1.73)

## 2008-03-10 LAB — IGG, IGA, IGM: IgM, Serum: 49 mg/dL — ABNORMAL LOW (ref 60–263)

## 2008-03-12 LAB — CBC WITH DIFFERENTIAL/PLATELET
BASO%: 1.6 % (ref 0.0–2.0)
EOS%: 0.2 % (ref 0.0–7.0)
HCT: 36.9 % (ref 34.8–46.6)
MCH: 33.9 pg (ref 26.0–34.0)
MCHC: 33.1 g/dL (ref 32.0–36.0)
MCV: 103 fL — ABNORMAL HIGH (ref 81.0–101.0)
MONO%: 2.6 % (ref 0.0–13.0)
NEUT%: 84.1 % — ABNORMAL HIGH (ref 39.6–76.8)
lymph#: 1.9 10*3/uL (ref 0.9–3.3)

## 2008-03-16 ENCOUNTER — Ambulatory Visit (HOSPITAL_COMMUNITY): Admission: RE | Admit: 2008-03-16 | Discharge: 2008-03-16 | Payer: Self-pay | Admitting: Hematology and Oncology

## 2008-03-17 LAB — UIFE/LIGHT CHAINS/TP QN, 24-HR UR
Free Kappa Lt Chains,Ur: 0.09 mg/dL (ref 0.04–1.51)
Time: 24 hours

## 2008-03-22 ENCOUNTER — Ambulatory Visit: Payer: Self-pay | Admitting: Hematology and Oncology

## 2008-04-16 LAB — CBC WITH DIFFERENTIAL/PLATELET
BASO%: 0.3 % (ref 0.0–2.0)
LYMPH%: 22.7 % (ref 14.0–48.0)
MCHC: 33.8 g/dL (ref 32.0–36.0)
MCV: 99.8 fL (ref 81.0–101.0)
MONO%: 8.2 % (ref 0.0–13.0)
Platelets: 155 10*3/uL (ref 145–400)
RBC: 3.53 10*6/uL — ABNORMAL LOW (ref 3.70–5.32)
RDW: 15 % — ABNORMAL HIGH (ref 11.3–14.5)
WBC: 6.4 10*3/uL (ref 3.9–10.0)

## 2008-04-16 LAB — BASIC METABOLIC PANEL
Calcium: 9.3 mg/dL (ref 8.4–10.5)
Potassium: 3.6 mEq/L (ref 3.5–5.3)
Sodium: 139 mEq/L (ref 135–145)

## 2008-04-28 ENCOUNTER — Ambulatory Visit: Payer: Self-pay | Admitting: Family Medicine

## 2008-05-03 DIAGNOSIS — E669 Obesity, unspecified: Secondary | ICD-10-CM

## 2008-05-03 DIAGNOSIS — H409 Unspecified glaucoma: Secondary | ICD-10-CM

## 2008-05-03 DIAGNOSIS — C9000 Multiple myeloma not having achieved remission: Secondary | ICD-10-CM

## 2008-05-03 DIAGNOSIS — I10 Essential (primary) hypertension: Secondary | ICD-10-CM

## 2008-05-07 ENCOUNTER — Ambulatory Visit: Payer: Self-pay | Admitting: Hematology and Oncology

## 2008-05-07 LAB — CBC WITH DIFFERENTIAL/PLATELET
Basophils Absolute: 0 10*3/uL (ref 0.0–0.1)
Eosinophils Absolute: 0.3 10*3/uL (ref 0.0–0.5)
HCT: 35.9 % (ref 34.8–46.6)
HGB: 12 g/dL (ref 11.6–15.9)
MCH: 32.6 pg (ref 26.0–34.0)
MCV: 97.2 fL (ref 81.0–101.0)
MONO%: 10.9 % (ref 0.0–13.0)
NEUT#: 4.7 10*3/uL (ref 1.5–6.5)
NEUT%: 62.7 % (ref 39.6–76.8)
RDW: 15.4 % — ABNORMAL HIGH (ref 11.3–14.5)

## 2008-05-07 LAB — BASIC METABOLIC PANEL
BUN: 9 mg/dL (ref 6–23)
Chloride: 104 mEq/L (ref 96–112)
Creatinine, Ser: 0.8 mg/dL (ref 0.40–1.20)
Glucose, Bld: 146 mg/dL — ABNORMAL HIGH (ref 70–99)
Potassium: 3.9 mEq/L (ref 3.5–5.3)

## 2008-06-16 LAB — COMPREHENSIVE METABOLIC PANEL
AST: 25 U/L (ref 0–37)
Albumin: 4 g/dL (ref 3.5–5.2)
Alkaline Phosphatase: 82 U/L (ref 39–117)
BUN: 11 mg/dL (ref 6–23)
Creatinine, Ser: 0.9 mg/dL (ref 0.40–1.20)
Glucose, Bld: 117 mg/dL — ABNORMAL HIGH (ref 70–99)
Potassium: 3.6 mEq/L (ref 3.5–5.3)
Total Bilirubin: 0.7 mg/dL (ref 0.3–1.2)

## 2008-06-16 LAB — CBC WITH DIFFERENTIAL/PLATELET
Basophils Absolute: 0.1 10*3/uL (ref 0.0–0.1)
EOS%: 2.4 % (ref 0.0–7.0)
Eosinophils Absolute: 0.1 10*3/uL (ref 0.0–0.5)
HGB: 12.9 g/dL (ref 11.6–15.9)
LYMPH%: 34.5 % (ref 14.0–48.0)
MCH: 32.3 pg (ref 26.0–34.0)
MCV: 95.2 fL (ref 81.0–101.0)
MONO%: 6.4 % (ref 0.0–13.0)
NEUT#: 3.4 10*3/uL (ref 1.5–6.5)
NEUT%: 55.8 % (ref 39.6–76.8)
Platelets: 166 10*3/uL (ref 145–400)
RDW: 15.9 % — ABNORMAL HIGH (ref 11.3–14.5)

## 2008-07-06 ENCOUNTER — Ambulatory Visit (HOSPITAL_COMMUNITY): Admission: RE | Admit: 2008-07-06 | Discharge: 2008-07-06 | Payer: Self-pay | Admitting: Family Medicine

## 2008-07-12 ENCOUNTER — Ambulatory Visit: Payer: Self-pay | Admitting: Hematology and Oncology

## 2008-07-14 ENCOUNTER — Encounter: Payer: Self-pay | Admitting: Family Medicine

## 2008-07-14 LAB — CBC WITH DIFFERENTIAL/PLATELET
BASO%: 0.5 % (ref 0.0–2.0)
Eosinophils Absolute: 0.1 10*3/uL (ref 0.0–0.5)
MCHC: 33.7 g/dL (ref 32.0–36.0)
MONO#: 0.5 10*3/uL (ref 0.1–0.9)
NEUT#: 4.7 10*3/uL (ref 1.5–6.5)
RBC: 3.95 10*6/uL (ref 3.70–5.32)
RDW: 16.5 % — ABNORMAL HIGH (ref 11.3–14.5)
WBC: 7.5 10*3/uL (ref 3.9–10.0)
lymph#: 2.2 10*3/uL (ref 0.9–3.3)

## 2008-07-14 LAB — COMPREHENSIVE METABOLIC PANEL
AST: 23 U/L (ref 0–37)
Albumin: 4 g/dL (ref 3.5–5.2)
Alkaline Phosphatase: 75 U/L (ref 39–117)
BUN: 11 mg/dL (ref 6–23)
Glucose, Bld: 137 mg/dL — ABNORMAL HIGH (ref 70–99)
Potassium: 3.7 mEq/L (ref 3.5–5.3)
Sodium: 139 mEq/L (ref 135–145)
Total Bilirubin: 0.7 mg/dL (ref 0.3–1.2)

## 2008-08-19 LAB — COMPREHENSIVE METABOLIC PANEL
ALT: 46 U/L — ABNORMAL HIGH (ref 0–35)
CO2: 27 mEq/L (ref 19–32)
Calcium: 9.7 mg/dL (ref 8.4–10.5)
Chloride: 103 mEq/L (ref 96–112)
Creatinine, Ser: 0.81 mg/dL (ref 0.40–1.20)
Glucose, Bld: 104 mg/dL — ABNORMAL HIGH (ref 70–99)
Sodium: 139 mEq/L (ref 135–145)
Total Protein: 7.1 g/dL (ref 6.0–8.3)

## 2008-08-19 LAB — CBC WITH DIFFERENTIAL/PLATELET
BASO%: 1.1 % (ref 0.0–2.0)
Eosinophils Absolute: 0.1 10*3/uL (ref 0.0–0.5)
HCT: 37.6 % (ref 34.8–46.6)
LYMPH%: 32 % (ref 14.0–48.0)
MCHC: 33.2 g/dL (ref 32.0–36.0)
MONO#: 0.3 10*3/uL (ref 0.1–0.9)
NEUT#: 2.9 10*3/uL (ref 1.5–6.5)
NEUT%: 58.5 % (ref 39.6–76.8)
Platelets: 129 10*3/uL — ABNORMAL LOW (ref 145–400)
WBC: 4.9 10*3/uL (ref 3.9–10.0)
lymph#: 1.6 10*3/uL (ref 0.9–3.3)

## 2008-08-30 ENCOUNTER — Encounter: Payer: Self-pay | Admitting: Family Medicine

## 2008-08-31 ENCOUNTER — Encounter: Payer: Self-pay | Admitting: Family Medicine

## 2008-08-31 LAB — CONVERTED CEMR LAB
ALT: 41 units/L — ABNORMAL HIGH (ref 0–35)
AST: 29 units/L (ref 0–37)
Alkaline Phosphatase: 84 units/L (ref 39–117)
BUN: 14 mg/dL (ref 6–23)
Basophils Absolute: 0 10*3/uL (ref 0.0–0.1)
Basophils Relative: 1 % (ref 0–1)
Calcium: 9.3 mg/dL (ref 8.4–10.5)
Cholesterol: 157 mg/dL (ref 0–200)
Creatinine, Ser: 0.95 mg/dL (ref 0.40–1.20)
Eosinophils Absolute: 0.3 10*3/uL (ref 0.0–0.7)
Eosinophils Relative: 5 % (ref 0–5)
HDL: 49 mg/dL (ref >39)
Hemoglobin: 12.5 g/dL (ref 12.0–15.0)
Indirect Bilirubin: 0.8 mg/dL (ref 0.0–0.9)
LDL Cholesterol: 85 mg/dL (ref 0–99)
MCHC: 31.9 g/dL (ref 30.0–36.0)
MCV: 98.7 fL (ref 78.0–100.0)
Monocytes Absolute: 0.4 10*3/uL (ref 0.1–1.0)
Monocytes Relative: 7 % (ref 3–12)
RBC: 3.97 M/uL (ref 3.87–5.11)
RDW: 15.1 % (ref 11.5–15.5)
Total Protein: 7.4 g/dL (ref 6.0–8.3)
Triglycerides: 117 mg/dL (ref <150)

## 2008-09-01 ENCOUNTER — Ambulatory Visit: Payer: Self-pay | Admitting: Hematology and Oncology

## 2008-09-03 ENCOUNTER — Encounter: Payer: Self-pay | Admitting: Family Medicine

## 2008-09-07 LAB — CBC WITH DIFFERENTIAL/PLATELET
BASO%: 0.5 % (ref 0.0–2.0)
LYMPH%: 35.9 % (ref 14.0–48.0)
MCHC: 34 g/dL (ref 32.0–36.0)
MCV: 98.5 fL (ref 81.0–101.0)
MONO#: 0.4 10*3/uL (ref 0.1–0.9)
MONO%: 6.5 % (ref 0.0–13.0)
Platelets: 103 10*3/uL — ABNORMAL LOW (ref 145–400)
RBC: 3.67 10*6/uL — ABNORMAL LOW (ref 3.70–5.32)
WBC: 5.9 10*3/uL (ref 3.9–10.0)

## 2008-09-09 ENCOUNTER — Encounter: Payer: Self-pay | Admitting: Family Medicine

## 2008-09-09 LAB — SPEP & IFE WITH QIG
Alpha-1-Globulin: 3.8 % (ref 2.9–4.9)
Alpha-2-Globulin: 9.2 % (ref 7.1–11.8)
Beta 2: 3.2 % (ref 3.2–6.5)
Beta Globulin: 19.7 % — ABNORMAL HIGH (ref 4.7–7.2)
Gamma Globulin: 10.4 % — ABNORMAL LOW (ref 11.1–18.8)
IgG (Immunoglobin G), Serum: 873 mg/dL (ref 694–1618)
M-Spike, %: 0.78 g/dL

## 2008-09-09 LAB — UIFE/LIGHT CHAINS/TP QN, 24-HR UR
Albumin, U: DETECTED
Alpha 1, Urine: DETECTED — AB
Beta, Urine: DETECTED — AB
Free Lambda Lt Chains,Ur: <0.05 mg/dL (ref 0.08–1.01)
Total Protein, Urine-Ur/day: 23 mg/d (ref 10–140)
Volume, Urine: 2500 mL

## 2008-09-09 LAB — COMPREHENSIVE METABOLIC PANEL
Albumin: 3.9 g/dL (ref 3.5–5.2)
BUN: 12 mg/dL (ref 6–23)
Calcium: 9.5 mg/dL (ref 8.4–10.5)
Chloride: 106 mEq/L (ref 96–112)
Creatinine, Ser: 0.82 mg/dL (ref 0.40–1.20)
Glucose, Bld: 148 mg/dL — ABNORMAL HIGH (ref 70–99)
Potassium: 3.6 mEq/L (ref 3.5–5.3)

## 2008-09-09 LAB — VISCOSITY, SERUM: Viscosity, Serum: 1.1 mPa.S (ref 1.1–2.0)

## 2008-09-09 LAB — LACTATE DEHYDROGENASE: LDH: 146 U/L (ref 94–250)

## 2008-09-09 LAB — BETA 2 MICROGLOBULIN, SERUM: Beta-2 Microglobulin: 1.97 mg/L — ABNORMAL HIGH (ref 1.01–1.73)

## 2008-09-10 ENCOUNTER — Encounter: Payer: Self-pay | Admitting: Family Medicine

## 2008-09-23 ENCOUNTER — Ambulatory Visit (HOSPITAL_COMMUNITY): Admission: RE | Admit: 2008-09-23 | Discharge: 2008-09-23 | Payer: Self-pay | Admitting: Hematology and Oncology

## 2008-09-23 ENCOUNTER — Encounter: Payer: Self-pay | Admitting: Hematology and Oncology

## 2008-09-28 ENCOUNTER — Ambulatory Visit: Payer: Self-pay | Admitting: Family Medicine

## 2008-09-28 DIAGNOSIS — D010 Carcinoma in situ of colon: Secondary | ICD-10-CM

## 2008-09-29 ENCOUNTER — Encounter: Payer: Self-pay | Admitting: Family Medicine

## 2008-09-29 LAB — COMPREHENSIVE METABOLIC PANEL
ALT: 47 U/L — ABNORMAL HIGH (ref 0–35)
BUN: 10 mg/dL (ref 6–23)
CO2: 25 mEq/L (ref 19–32)
Calcium: 9.5 mg/dL (ref 8.4–10.5)
Chloride: 102 mEq/L (ref 96–112)
Creatinine, Ser: 0.8 mg/dL (ref 0.40–1.20)
Glucose, Bld: 124 mg/dL — ABNORMAL HIGH (ref 70–99)
Total Bilirubin: 1 mg/dL (ref 0.3–1.2)

## 2008-09-29 LAB — CBC WITH DIFFERENTIAL/PLATELET
Basophils Absolute: 0 10*3/uL (ref 0.0–0.1)
Eosinophils Absolute: 0.1 10*3/uL (ref 0.0–0.5)
HCT: 36.8 % (ref 34.8–46.6)
HGB: 12.4 g/dL (ref 11.6–15.9)
LYMPH%: 24.7 % (ref 14.0–48.0)
MCHC: 33.7 g/dL (ref 32.0–36.0)
MONO#: 0.4 10*3/uL (ref 0.1–0.9)
NEUT#: 3.4 10*3/uL (ref 1.5–6.5)
NEUT%: 64.9 % (ref 39.6–76.8)
Platelets: 104 10*3/uL — ABNORMAL LOW (ref 145–400)
WBC: 5.2 10*3/uL (ref 3.9–10.0)
lymph#: 1.3 10*3/uL (ref 0.9–3.3)

## 2008-10-12 ENCOUNTER — Ambulatory Visit: Payer: Self-pay | Admitting: Internal Medicine

## 2008-10-14 LAB — COMPREHENSIVE METABOLIC PANEL
Albumin: 4.1 g/dL (ref 3.5–5.2)
BUN: 14 mg/dL (ref 6–23)
Calcium: 9.3 mg/dL (ref 8.4–10.5)
Chloride: 103 mEq/L (ref 96–112)
Glucose, Bld: 310 mg/dL — ABNORMAL HIGH (ref 70–99)
Potassium: 4.4 mEq/L (ref 3.5–5.3)

## 2008-10-14 LAB — CBC WITH DIFFERENTIAL/PLATELET
Basophils Absolute: 0 10*3/uL (ref 0.0–0.1)
Eosinophils Absolute: 0 10*3/uL (ref 0.0–0.5)
HCT: 38 % (ref 34.8–46.6)
HGB: 12.6 g/dL (ref 11.6–15.9)
MCV: 100.1 fL (ref 81.0–101.0)
MONO%: 0.1 % (ref 0.0–13.0)
NEUT#: 6 10*3/uL (ref 1.5–6.5)
NEUT%: 86.4 % — ABNORMAL HIGH (ref 39.6–76.8)
RDW: 16.8 % — ABNORMAL HIGH (ref 11.3–14.5)
lymph#: 0.9 10*3/uL (ref 0.9–3.3)

## 2008-10-20 ENCOUNTER — Ambulatory Visit: Payer: Self-pay | Admitting: Hematology and Oncology

## 2008-10-22 LAB — CBC WITH DIFFERENTIAL/PLATELET
Basophils Absolute: 0 10*3/uL (ref 0.0–0.1)
Eosinophils Absolute: 0 10*3/uL (ref 0.0–0.5)
HCT: 36 % (ref 34.8–46.6)
HGB: 12.1 g/dL (ref 11.6–15.9)
LYMPH%: 10.3 % — ABNORMAL LOW (ref 14.0–48.0)
MCV: 100.1 fL (ref 81.0–101.0)
MONO%: 7.3 % (ref 0.0–13.0)
NEUT#: 12.2 10*3/uL — ABNORMAL HIGH (ref 1.5–6.5)
NEUT%: 82.3 % — ABNORMAL HIGH (ref 39.6–76.8)
Platelets: 175 10*3/uL (ref 145–400)
RBC: 3.59 10*6/uL — ABNORMAL LOW (ref 3.70–5.32)

## 2008-10-22 LAB — BASIC METABOLIC PANEL
BUN: 22 mg/dL (ref 6–23)
Calcium: 9.4 mg/dL (ref 8.4–10.5)
Creatinine, Ser: 1.13 mg/dL (ref 0.40–1.20)
Glucose, Bld: 123 mg/dL — ABNORMAL HIGH (ref 70–99)

## 2008-10-26 ENCOUNTER — Ambulatory Visit: Payer: Self-pay | Admitting: Family Medicine

## 2008-10-26 DIAGNOSIS — J209 Acute bronchitis, unspecified: Secondary | ICD-10-CM

## 2008-10-29 LAB — CBC WITH DIFFERENTIAL/PLATELET
MCH: 34.2 pg — ABNORMAL HIGH (ref 26.0–34.0)
MCHC: 33.8 g/dL (ref 32.0–36.0)
MONO#: 1.5 10*3/uL — ABNORMAL HIGH (ref 0.1–0.9)
MONO%: 11.3 % (ref 0.0–13.0)
RBC: 3.39 10*6/uL — ABNORMAL LOW (ref 3.70–5.32)
WBC: 13.2 10*3/uL — ABNORMAL HIGH (ref 3.9–10.0)
lymph#: 0.9 10*3/uL (ref 0.9–3.3)

## 2008-10-29 LAB — BASIC METABOLIC PANEL
BUN: 18 mg/dL (ref 6–23)
Creatinine, Ser: 1.01 mg/dL (ref 0.40–1.20)
Glucose, Bld: 149 mg/dL — ABNORMAL HIGH (ref 70–99)
Potassium: 4 mEq/L (ref 3.5–5.3)

## 2008-11-01 ENCOUNTER — Ambulatory Visit (HOSPITAL_COMMUNITY): Admission: RE | Admit: 2008-11-01 | Discharge: 2008-11-01 | Payer: Self-pay | Admitting: Family Medicine

## 2008-11-01 ENCOUNTER — Ambulatory Visit: Payer: Self-pay | Admitting: Family Medicine

## 2008-11-01 DIAGNOSIS — J019 Acute sinusitis, unspecified: Secondary | ICD-10-CM | POA: Insufficient documentation

## 2008-11-02 ENCOUNTER — Telehealth: Payer: Self-pay | Admitting: Family Medicine

## 2008-11-03 ENCOUNTER — Encounter: Payer: Self-pay | Admitting: Family Medicine

## 2008-11-04 LAB — COMPREHENSIVE METABOLIC PANEL
Alkaline Phosphatase: 96 U/L (ref 39–117)
BUN: 14 mg/dL (ref 6–23)
Creatinine, Ser: 1.14 mg/dL (ref 0.40–1.20)
Glucose, Bld: 165 mg/dL — ABNORMAL HIGH (ref 70–99)
Sodium: 137 mEq/L (ref 135–145)
Total Bilirubin: 0.7 mg/dL (ref 0.3–1.2)

## 2008-11-04 LAB — CBC WITH DIFFERENTIAL/PLATELET
Eosinophils Absolute: 0 10*3/uL (ref 0.0–0.5)
LYMPH%: 18.1 % (ref 14.0–48.0)
MCV: 101.5 fL — ABNORMAL HIGH (ref 81.0–101.0)
MONO%: 1.1 % (ref 0.0–13.0)
NEUT#: 6.6 10*3/uL — ABNORMAL HIGH (ref 1.5–6.5)
Platelets: 112 10*3/uL — ABNORMAL LOW (ref 145–400)
RBC: 3.68 10*6/uL — ABNORMAL LOW (ref 3.70–5.32)

## 2008-11-04 LAB — MAGNESIUM: Magnesium: 2.2 mg/dL (ref 1.5–2.5)

## 2008-11-04 LAB — VITAMIN B12: Vitamin B-12: 1563 pg/mL — ABNORMAL HIGH (ref 211–911)

## 2008-11-11 ENCOUNTER — Encounter: Payer: Self-pay | Admitting: Family Medicine

## 2008-11-23 LAB — CBC WITH DIFFERENTIAL/PLATELET
BASO%: 0.1 % (ref 0.0–2.0)
EOS%: 2.4 % (ref 0.0–7.0)
HCT: 33.6 % — ABNORMAL LOW (ref 34.8–46.6)
MCH: 34.5 pg — ABNORMAL HIGH (ref 26.0–34.0)
MCHC: 33.8 g/dL (ref 32.0–36.0)
MONO#: 0.7 10*3/uL (ref 0.1–0.9)
NEUT%: 73.4 % (ref 39.6–76.8)
RBC: 3.28 10*6/uL — ABNORMAL LOW (ref 3.70–5.32)
RDW: 16.1 % — ABNORMAL HIGH (ref 11.3–14.5)
WBC: 8.1 10*3/uL (ref 3.9–10.0)
lymph#: 1.3 10*3/uL (ref 0.9–3.3)

## 2008-11-23 LAB — BASIC METABOLIC PANEL
Chloride: 103 mEq/L (ref 96–112)
Creatinine, Ser: 0.85 mg/dL (ref 0.40–1.20)
Sodium: 140 mEq/L (ref 135–145)

## 2008-12-01 ENCOUNTER — Encounter: Payer: Self-pay | Admitting: Family Medicine

## 2008-12-01 LAB — CBC WITH DIFFERENTIAL/PLATELET
Eosinophils Absolute: 0.4 10*3/uL (ref 0.0–0.5)
LYMPH%: 19.5 % (ref 14.0–48.0)
MCHC: 33.6 g/dL (ref 32.0–36.0)
MCV: 102.9 fL — ABNORMAL HIGH (ref 81.0–101.0)
MONO%: 6.1 % (ref 0.0–13.0)
NEUT#: 4.1 10*3/uL (ref 1.5–6.5)
NEUT%: 67.8 % (ref 39.6–76.8)
Platelets: 91 10*3/uL — ABNORMAL LOW (ref 145–400)
RBC: 3.42 10*6/uL — ABNORMAL LOW (ref 3.70–5.32)

## 2008-12-01 LAB — COMPREHENSIVE METABOLIC PANEL
Alkaline Phosphatase: 97 U/L (ref 39–117)
BUN: 11 mg/dL (ref 6–23)
Creatinine, Ser: 0.96 mg/dL (ref 0.40–1.20)
Glucose, Bld: 147 mg/dL — ABNORMAL HIGH (ref 70–99)
Sodium: 138 mEq/L (ref 135–145)
Total Bilirubin: 1 mg/dL (ref 0.3–1.2)
Total Protein: 6.1 g/dL (ref 6.0–8.3)

## 2008-12-01 LAB — LACTATE DEHYDROGENASE: LDH: 145 U/L (ref 94–250)

## 2008-12-03 ENCOUNTER — Ambulatory Visit: Payer: Self-pay | Admitting: Family Medicine

## 2008-12-07 ENCOUNTER — Ambulatory Visit: Payer: Self-pay | Admitting: Hematology and Oncology

## 2008-12-08 ENCOUNTER — Ambulatory Visit: Payer: Self-pay | Admitting: Family Medicine

## 2008-12-09 LAB — CBC WITH DIFFERENTIAL/PLATELET
Basophils Absolute: 0.1 10*3/uL (ref 0.0–0.1)
Eosinophils Absolute: 0 10*3/uL (ref 0.0–0.5)
HGB: 12.4 g/dL (ref 11.6–15.9)
MONO#: 0.1 10*3/uL (ref 0.1–0.9)
NEUT#: 5.7 10*3/uL (ref 1.5–6.5)
RDW: 14.3 % (ref 11.3–14.5)
lymph#: 0.9 10*3/uL (ref 0.9–3.3)

## 2008-12-17 LAB — CBC WITH DIFFERENTIAL/PLATELET
Basophils Absolute: 0 10*3/uL (ref 0.0–0.1)
Eosinophils Absolute: 0 10*3/uL (ref 0.0–0.5)
HCT: 36.9 % (ref 34.8–46.6)
HGB: 12.3 g/dL (ref 11.6–15.9)
LYMPH%: 10.7 % — ABNORMAL LOW (ref 14.0–48.0)
MCV: 102.1 fL — ABNORMAL HIGH (ref 81.0–101.0)
MONO%: 6.5 % (ref 0.0–13.0)
NEUT#: 9 10*3/uL — ABNORMAL HIGH (ref 1.5–6.5)
Platelets: 127 10*3/uL — ABNORMAL LOW (ref 145–400)
RDW: 15.4 % — ABNORMAL HIGH (ref 11.3–14.5)

## 2009-01-05 LAB — CBC WITH DIFFERENTIAL/PLATELET
BASO%: 0.7 % (ref 0.0–2.0)
Eosinophils Absolute: 0.1 10*3/uL (ref 0.0–0.5)
MCV: 101.6 fL — ABNORMAL HIGH (ref 81.0–101.0)
MONO%: 6.7 % (ref 0.0–13.0)
NEUT#: 5.1 10*3/uL (ref 1.5–6.5)
RBC: 3.59 10*6/uL — ABNORMAL LOW (ref 3.70–5.32)
RDW: 16 % — ABNORMAL HIGH (ref 11.3–14.5)
WBC: 7.1 10*3/uL (ref 3.9–10.0)

## 2009-01-05 LAB — COMPREHENSIVE METABOLIC PANEL
ALT: 47 U/L — ABNORMAL HIGH (ref 0–35)
AST: 29 U/L (ref 0–37)
Albumin: 3.9 g/dL (ref 3.5–5.2)
Alkaline Phosphatase: 75 U/L (ref 39–117)
Glucose, Bld: 167 mg/dL — ABNORMAL HIGH (ref 70–99)
Potassium: 3.8 mEq/L (ref 3.5–5.3)
Sodium: 139 mEq/L (ref 135–145)
Total Protein: 6.3 g/dL (ref 6.0–8.3)

## 2009-01-14 ENCOUNTER — Ambulatory Visit: Payer: Self-pay | Admitting: Hematology and Oncology

## 2009-01-14 LAB — CBC WITH DIFFERENTIAL/PLATELET
Basophils Absolute: 0 10*3/uL (ref 0.0–0.1)
Eosinophils Absolute: 0 10*3/uL (ref 0.0–0.5)
HGB: 12.3 g/dL (ref 11.6–15.9)
LYMPH%: 10.7 % — ABNORMAL LOW (ref 14.0–48.0)
MCV: 100.5 fL (ref 81.0–101.0)
MONO%: 9.4 % (ref 0.0–13.0)
NEUT#: 8.5 10*3/uL — ABNORMAL HIGH (ref 1.5–6.5)
Platelets: 126 10*3/uL — ABNORMAL LOW (ref 145–400)
RBC: 3.64 10*6/uL — ABNORMAL LOW (ref 3.70–5.32)

## 2009-01-17 ENCOUNTER — Encounter: Payer: Self-pay | Admitting: Family Medicine

## 2009-01-27 LAB — COMPREHENSIVE METABOLIC PANEL
ALT: 61 U/L — ABNORMAL HIGH (ref 0–35)
Alkaline Phosphatase: 75 U/L (ref 39–117)
Sodium: 139 mEq/L (ref 135–145)
Total Bilirubin: 0.8 mg/dL (ref 0.3–1.2)
Total Protein: 6.5 g/dL (ref 6.0–8.3)

## 2009-01-27 LAB — CBC WITH DIFFERENTIAL/PLATELET
BASO%: 0.1 % (ref 0.0–2.0)
LYMPH%: 11.2 % — ABNORMAL LOW (ref 14.0–48.0)
MCHC: 33.7 g/dL (ref 32.0–36.0)
MCV: 100.9 fL (ref 81.0–101.0)
MONO%: 2.1 % (ref 0.0–13.0)
Platelets: 99 10*3/uL — ABNORMAL LOW (ref 145–400)
RBC: 3.8 10*6/uL (ref 3.70–5.32)

## 2009-02-02 ENCOUNTER — Encounter: Payer: Self-pay | Admitting: Family Medicine

## 2009-02-02 LAB — CBC WITH DIFFERENTIAL/PLATELET
Basophils Absolute: 0 10*3/uL (ref 0.0–0.1)
EOS%: 0.4 % (ref 0.0–7.0)
HCT: 37.2 % (ref 34.8–46.6)
HGB: 12.4 g/dL (ref 11.6–15.9)
MCH: 33.7 pg (ref 26.0–34.0)
MCV: 101.2 fL — ABNORMAL HIGH (ref 81.0–101.0)
MONO%: 6.8 % (ref 0.0–13.0)
NEUT%: 73.9 % (ref 39.6–76.8)

## 2009-02-02 LAB — BASIC METABOLIC PANEL
BUN: 10 mg/dL (ref 6–23)
Chloride: 103 mEq/L (ref 96–112)
Creatinine, Ser: 0.78 mg/dL (ref 0.40–1.20)

## 2009-03-01 ENCOUNTER — Ambulatory Visit: Payer: Self-pay | Admitting: Hematology and Oncology

## 2009-03-01 ENCOUNTER — Encounter: Payer: Self-pay | Admitting: Family Medicine

## 2009-03-01 LAB — COMPREHENSIVE METABOLIC PANEL
ALT: 32 U/L (ref 0–35)
CO2: 24 mEq/L (ref 19–32)
Calcium: 9.3 mg/dL (ref 8.4–10.5)
Chloride: 104 mEq/L (ref 96–112)
Sodium: 142 mEq/L (ref 135–145)
Total Protein: 6 g/dL (ref 6.0–8.3)

## 2009-03-01 LAB — CBC WITH DIFFERENTIAL/PLATELET
BASO%: 0 % (ref 0.0–2.0)
HCT: 36.6 % (ref 34.8–46.6)
MCHC: 33.8 g/dL (ref 31.5–36.0)
MONO#: 0.6 10*3/uL (ref 0.1–0.9)
NEUT#: 5.9 10*3/uL (ref 1.5–6.5)
NEUT%: 75.8 % (ref 38.4–76.8)
RBC: 3.66 10*6/uL — ABNORMAL LOW (ref 3.70–5.45)
WBC: 7.8 10*3/uL (ref 3.9–10.3)
lymph#: 1.2 10*3/uL (ref 0.9–3.3)

## 2009-03-18 ENCOUNTER — Encounter: Payer: Self-pay | Admitting: Family Medicine

## 2009-03-30 ENCOUNTER — Encounter: Payer: Self-pay | Admitting: Family Medicine

## 2009-03-30 LAB — COMPREHENSIVE METABOLIC PANEL
ALT: 29 U/L (ref 0–35)
CO2: 27 mEq/L (ref 19–32)
Chloride: 103 mEq/L (ref 96–112)
Sodium: 140 mEq/L (ref 135–145)
Total Bilirubin: 1.2 mg/dL (ref 0.3–1.2)
Total Protein: 5.8 g/dL — ABNORMAL LOW (ref 6.0–8.3)

## 2009-03-30 LAB — CBC WITH DIFFERENTIAL/PLATELET
BASO%: 0.4 % (ref 0.0–2.0)
MCHC: 33.5 g/dL (ref 31.5–36.0)
MONO#: 0.5 10*3/uL (ref 0.1–0.9)
RBC: 3.57 10*6/uL — ABNORMAL LOW (ref 3.70–5.45)
WBC: 7.5 10*3/uL (ref 3.9–10.3)
lymph#: 1.1 10*3/uL (ref 0.9–3.3)

## 2009-03-30 LAB — LACTATE DEHYDROGENASE: LDH: 130 U/L (ref 94–250)

## 2009-04-08 LAB — CBC WITH DIFFERENTIAL/PLATELET
BASO%: 0.2 % (ref 0.0–2.0)
EOS%: 0.1 % (ref 0.0–7.0)
HCT: 37.7 % (ref 34.8–46.6)
LYMPH%: 6.5 % — ABNORMAL LOW (ref 14.0–49.7)
MCH: 33.3 pg (ref 25.1–34.0)
MCHC: 33.2 g/dL (ref 31.5–36.0)
MCV: 100.3 fL (ref 79.5–101.0)
NEUT%: 89.1 % — ABNORMAL HIGH (ref 38.4–76.8)
Platelets: 107 10*3/uL — ABNORMAL LOW (ref 145–400)

## 2009-04-20 ENCOUNTER — Ambulatory Visit: Payer: Self-pay | Admitting: Family Medicine

## 2009-04-20 LAB — CONVERTED CEMR LAB: Hgb A1c MFr Bld: 7.4 %

## 2009-04-24 DIAGNOSIS — E669 Obesity, unspecified: Secondary | ICD-10-CM

## 2009-04-24 DIAGNOSIS — E1169 Type 2 diabetes mellitus with other specified complication: Secondary | ICD-10-CM | POA: Insufficient documentation

## 2009-04-25 ENCOUNTER — Encounter: Payer: Self-pay | Admitting: Family Medicine

## 2009-04-25 ENCOUNTER — Ambulatory Visit: Payer: Self-pay | Admitting: Hematology and Oncology

## 2009-04-27 ENCOUNTER — Encounter: Payer: Self-pay | Admitting: Family Medicine

## 2009-04-27 LAB — CBC WITH DIFFERENTIAL/PLATELET
BASO%: 0.3 % (ref 0.0–2.0)
EOS%: 0.1 % (ref 0.0–7.0)
HCT: 34.2 % — ABNORMAL LOW (ref 34.8–46.6)
MCH: 33.9 pg (ref 25.1–34.0)
MCHC: 33.8 g/dL (ref 31.5–36.0)
NEUT%: 75.5 % (ref 38.4–76.8)
RDW: 16.4 % — ABNORMAL HIGH (ref 11.2–14.5)
lymph#: 1.2 10*3/uL (ref 0.9–3.3)

## 2009-04-27 LAB — COMPREHENSIVE METABOLIC PANEL
ALT: 30 U/L (ref 0–35)
CO2: 27 mEq/L (ref 19–32)
Calcium: 9.3 mg/dL (ref 8.4–10.5)
Chloride: 106 mEq/L (ref 96–112)
Creatinine, Ser: 0.96 mg/dL (ref 0.40–1.20)
Glucose, Bld: 132 mg/dL — ABNORMAL HIGH (ref 70–99)

## 2009-05-19 ENCOUNTER — Encounter: Payer: Self-pay | Admitting: Family Medicine

## 2009-05-19 LAB — CBC WITH DIFFERENTIAL/PLATELET
BASO%: 0.2 % (ref 0.0–2.0)
Basophils Absolute: 0 10*3/uL (ref 0.0–0.1)
Eosinophils Absolute: 0 10*3/uL (ref 0.0–0.5)
HGB: 12.2 g/dL (ref 11.6–15.9)
LYMPH%: 12.2 % — ABNORMAL LOW (ref 14.0–49.7)
MONO%: 0.5 % (ref 0.0–14.0)
NEUT#: 5.7 10*3/uL (ref 1.5–6.5)
NEUT%: 87.1 % — ABNORMAL HIGH (ref 38.4–76.8)
RBC: 3.68 10*6/uL — ABNORMAL LOW (ref 3.70–5.45)
RDW: 16.6 % — ABNORMAL HIGH (ref 11.2–14.5)
lymph#: 0.8 10*3/uL — ABNORMAL LOW (ref 0.9–3.3)

## 2009-05-19 LAB — COMPREHENSIVE METABOLIC PANEL
ALT: 41 U/L — ABNORMAL HIGH (ref 0–35)
Alkaline Phosphatase: 63 U/L (ref 39–117)
CO2: 23 mEq/L (ref 19–32)
Creatinine, Ser: 0.92 mg/dL (ref 0.40–1.20)
Glucose, Bld: 221 mg/dL — ABNORMAL HIGH (ref 70–99)
Total Bilirubin: 1 mg/dL (ref 0.3–1.2)

## 2009-05-26 LAB — CBC WITH DIFFERENTIAL/PLATELET
Eosinophils Absolute: 0 10*3/uL (ref 0.0–0.5)
HCT: 37 % (ref 34.8–46.6)
LYMPH%: 11.4 % — ABNORMAL LOW (ref 14.0–49.7)
MONO#: 0.1 10*3/uL (ref 0.1–0.9)
NEUT#: 4.6 10*3/uL (ref 1.5–6.5)
NEUT%: 87 % — ABNORMAL HIGH (ref 38.4–76.8)
Platelets: 117 10*3/uL — ABNORMAL LOW (ref 145–400)
WBC: 5.3 10*3/uL (ref 3.9–10.3)

## 2009-06-01 ENCOUNTER — Ambulatory Visit: Payer: Self-pay | Admitting: Family Medicine

## 2009-06-02 ENCOUNTER — Ambulatory Visit (HOSPITAL_COMMUNITY): Admission: RE | Admit: 2009-06-02 | Discharge: 2009-06-02 | Payer: Self-pay | Admitting: Hematology and Oncology

## 2009-06-10 ENCOUNTER — Ambulatory Visit: Payer: Self-pay | Admitting: Hematology and Oncology

## 2009-06-21 ENCOUNTER — Encounter: Payer: Self-pay | Admitting: Family Medicine

## 2009-06-21 LAB — BASIC METABOLIC PANEL
Chloride: 107 mEq/L (ref 96–112)
Potassium: 3.4 mEq/L — ABNORMAL LOW (ref 3.5–5.3)
Sodium: 142 mEq/L (ref 135–145)

## 2009-06-21 LAB — CBC WITH DIFFERENTIAL/PLATELET
BASO%: 0.2 % (ref 0.0–2.0)
EOS%: 1.6 % (ref 0.0–7.0)
HGB: 11.3 g/dL — ABNORMAL LOW (ref 11.6–15.9)
MCH: 34.2 pg — ABNORMAL HIGH (ref 25.1–34.0)
MCV: 98.2 fL (ref 79.5–101.0)
MONO%: 7.5 % (ref 0.0–14.0)
RBC: 3.32 10*6/uL — ABNORMAL LOW (ref 3.70–5.45)
RDW: 16.3 % — ABNORMAL HIGH (ref 11.2–14.5)
lymph#: 0.8 10*3/uL — ABNORMAL LOW (ref 0.9–3.3)

## 2009-07-08 ENCOUNTER — Ambulatory Visit (HOSPITAL_COMMUNITY): Admission: RE | Admit: 2009-07-08 | Discharge: 2009-07-08 | Payer: Self-pay | Admitting: Family Medicine

## 2009-07-15 ENCOUNTER — Encounter: Payer: Self-pay | Admitting: Family Medicine

## 2009-07-18 ENCOUNTER — Ambulatory Visit: Payer: Self-pay | Admitting: Hematology and Oncology

## 2009-07-20 ENCOUNTER — Encounter: Payer: Self-pay | Admitting: Family Medicine

## 2009-07-20 LAB — BASIC METABOLIC PANEL
CO2: 22 mEq/L (ref 19–32)
Calcium: 9 mg/dL (ref 8.4–10.5)
Chloride: 106 mEq/L (ref 96–112)
Creatinine, Ser: 1.07 mg/dL (ref 0.40–1.20)
Glucose, Bld: 180 mg/dL — ABNORMAL HIGH (ref 70–99)

## 2009-07-20 LAB — CBC WITH DIFFERENTIAL/PLATELET
BASO%: 0 % (ref 0.0–2.0)
Eosinophils Absolute: 0 10*3/uL (ref 0.0–0.5)
HCT: 35 % (ref 34.8–46.6)
HGB: 11.7 g/dL (ref 11.6–15.9)
LYMPH%: 13.6 % — ABNORMAL LOW (ref 14.0–49.7)
MONO#: 0.5 10*3/uL (ref 0.1–0.9)
NEUT#: 4.3 10*3/uL (ref 1.5–6.5)
NEUT%: 76.8 % (ref 38.4–76.8)
Platelets: 83 10*3/uL — ABNORMAL LOW (ref 145–400)
WBC: 5.6 10*3/uL (ref 3.9–10.3)
lymph#: 0.8 10*3/uL — ABNORMAL LOW (ref 0.9–3.3)

## 2009-07-27 LAB — CBC WITH DIFFERENTIAL/PLATELET
BASO%: 0.3 % (ref 0.0–2.0)
Basophils Absolute: 0 10e3/uL (ref 0.0–0.1)
EOS%: 0.8 % (ref 0.0–7.0)
Eosinophils Absolute: 0 10e3/uL (ref 0.0–0.5)
HCT: 34.1 % — ABNORMAL LOW (ref 34.8–46.6)
HGB: 11.4 g/dL — ABNORMAL LOW (ref 11.6–15.9)
LYMPH%: 16.6 % (ref 14.0–49.7)
MCH: 33 pg (ref 25.1–34.0)
MCHC: 33.4 g/dL (ref 31.5–36.0)
MCV: 98.5 fL (ref 79.5–101.0)
MONO#: 0.5 10e3/uL (ref 0.1–0.9)
MONO%: 9.1 % (ref 0.0–14.0)
NEUT#: 3.9 10e3/uL (ref 1.5–6.5)
NEUT%: 73.2 % (ref 38.4–76.8)
Platelets: 105 10e3/uL — ABNORMAL LOW (ref 145–400)
RBC: 3.46 10e6/uL — ABNORMAL LOW (ref 3.70–5.45)
RDW: 17.3 % — ABNORMAL HIGH (ref 11.2–14.5)
WBC: 5.3 10e3/uL (ref 3.9–10.3)
lymph#: 0.9 10e3/uL (ref 0.9–3.3)

## 2009-08-03 ENCOUNTER — Ambulatory Visit: Payer: Self-pay | Admitting: Family Medicine

## 2009-08-03 LAB — CONVERTED CEMR LAB: Glucose, Bld: 141 mg/dL

## 2009-08-05 ENCOUNTER — Encounter (INDEPENDENT_AMBULATORY_CARE_PROVIDER_SITE_OTHER): Payer: Self-pay | Admitting: *Deleted

## 2009-08-18 ENCOUNTER — Ambulatory Visit: Payer: Self-pay | Admitting: Oncology

## 2009-08-18 LAB — BASIC METABOLIC PANEL
CO2: 31 mEq/L (ref 19–32)
Calcium: 9.3 mg/dL (ref 8.4–10.5)
Chloride: 101 mEq/L (ref 96–112)
Glucose, Bld: 280 mg/dL — ABNORMAL HIGH (ref 70–99)
Sodium: 139 mEq/L (ref 135–145)

## 2009-08-18 LAB — CBC WITH DIFFERENTIAL/PLATELET
Basophils Absolute: 0 10*3/uL (ref 0.0–0.1)
HGB: 12 g/dL (ref 11.6–15.9)
MCH: 33 pg (ref 25.1–34.0)
MCHC: 33.3 g/dL (ref 31.5–36.0)
MCV: 98.9 fL (ref 79.5–101.0)
RDW: 17 % — ABNORMAL HIGH (ref 11.2–14.5)

## 2009-08-25 LAB — CBC WITH DIFFERENTIAL/PLATELET
BASO%: 0.1 % (ref 0.0–2.0)
Eosinophils Absolute: 0 10*3/uL (ref 0.0–0.5)
MCHC: 34 g/dL (ref 31.5–36.0)
MONO#: 0.6 10*3/uL (ref 0.1–0.9)
NEUT#: 3.3 10*3/uL (ref 1.5–6.5)
Platelets: 102 10*3/uL — ABNORMAL LOW (ref 145–400)
RBC: 3.64 10*6/uL — ABNORMAL LOW (ref 3.70–5.45)
RDW: 16.8 % — ABNORMAL HIGH (ref 11.2–14.5)
WBC: 4.8 10*3/uL (ref 3.9–10.3)
lymph#: 0.9 10*3/uL (ref 0.9–3.3)

## 2009-08-29 ENCOUNTER — Encounter: Payer: Self-pay | Admitting: Family Medicine

## 2009-08-29 LAB — CONVERTED CEMR LAB
BUN: 22 mg/dL (ref 6–23)
Chloride: 107 meq/L (ref 96–112)
Eosinophils Relative: 0 % (ref 0–5)
HCT: 36.3 % (ref 36.0–46.0)
HDL: 55 mg/dL (ref >39)
LDL Cholesterol: 95 mg/dL (ref 0–99)
Lymphocytes Relative: 20 % (ref 12–46)
Lymphs Abs: 1.5 10*3/uL (ref 0.7–4.0)
Monocytes Relative: 15 % — ABNORMAL HIGH (ref 3–12)
Platelets: 122 10*3/uL — ABNORMAL LOW (ref 150–400)
Potassium: 3.7 meq/L (ref 3.5–5.3)
RBC: 3.69 M/uL — ABNORMAL LOW (ref 3.87–5.11)
Triglycerides: 153 mg/dL — ABNORMAL HIGH (ref <150)
VLDL: 31 mg/dL (ref 0–40)
WBC: 7.3 10*3/uL (ref 4.0–10.5)

## 2009-09-01 LAB — CBC WITH DIFFERENTIAL/PLATELET
BASO%: 0.3 % (ref 0.0–2.0)
HCT: 36 % (ref 34.8–46.6)
HGB: 12.1 g/dL (ref 11.6–15.9)
MCHC: 33.8 g/dL (ref 31.5–36.0)
MONO#: 0.3 10*3/uL (ref 0.1–0.9)
NEUT%: 78.8 % — ABNORMAL HIGH (ref 38.4–76.8)
RDW: 17.1 % — ABNORMAL HIGH (ref 11.2–14.5)
WBC: 5.3 10*3/uL (ref 3.9–10.3)
lymph#: 0.7 10*3/uL — ABNORMAL LOW (ref 0.9–3.3)

## 2009-09-06 LAB — KAPPA/LAMBDA LIGHT CHAINS

## 2009-09-06 LAB — IMMUNOFIXATION ELECTROPHORESIS
IgA: 229 mg/dL (ref 68–378)
IgM, Serum: 32 mg/dL — ABNORMAL LOW (ref 60–263)
Total Protein, Serum Electrophoresis: 6.1 g/dL (ref 6.0–8.3)

## 2009-09-08 ENCOUNTER — Encounter: Payer: Self-pay | Admitting: Family Medicine

## 2009-09-08 LAB — COMPREHENSIVE METABOLIC PANEL
AST: 40 U/L — ABNORMAL HIGH (ref 0–37)
Albumin: 3.6 g/dL (ref 3.5–5.2)
Alkaline Phosphatase: 63 U/L (ref 39–117)
Potassium: 4 mEq/L (ref 3.5–5.3)
Sodium: 140 mEq/L (ref 135–145)
Total Protein: 6.2 g/dL (ref 6.0–8.3)

## 2009-09-08 LAB — CBC WITH DIFFERENTIAL/PLATELET
BASO%: 0.1 % (ref 0.0–2.0)
EOS%: 1.9 % (ref 0.0–7.0)
MCH: 33.4 pg (ref 25.1–34.0)
MCV: 100.1 fL (ref 79.5–101.0)
MONO%: 9.8 % (ref 0.0–14.0)
NEUT#: 5.6 10*3/uL (ref 1.5–6.5)
RBC: 3.64 10*6/uL — ABNORMAL LOW (ref 3.70–5.45)
RDW: 16.7 % — ABNORMAL HIGH (ref 11.2–14.5)

## 2009-09-15 LAB — CBC WITH DIFFERENTIAL/PLATELET
Basophils Absolute: 0 10*3/uL (ref 0.0–0.1)
EOS%: 2.8 % (ref 0.0–7.0)
LYMPH%: 14.4 % (ref 14.0–49.7)
MCH: 33.8 pg (ref 25.1–34.0)
MCV: 99.7 fL (ref 79.5–101.0)
MONO%: 7.3 % (ref 0.0–14.0)
RBC: 3.8 10*6/uL (ref 3.70–5.45)
RDW: 17.1 % — ABNORMAL HIGH (ref 11.2–14.5)

## 2009-09-16 ENCOUNTER — Encounter: Payer: Self-pay | Admitting: Family Medicine

## 2009-09-20 ENCOUNTER — Ambulatory Visit: Payer: Self-pay | Admitting: Oncology

## 2009-09-22 LAB — CBC WITH DIFFERENTIAL/PLATELET
BASO%: 0 % (ref 0.0–2.0)
Eosinophils Absolute: 0 10*3/uL (ref 0.0–0.5)
MCHC: 34 g/dL (ref 31.5–36.0)
MCV: 100.1 fL (ref 79.5–101.0)
MONO%: 9.7 % (ref 0.0–14.0)
NEUT#: 4.1 10*3/uL (ref 1.5–6.5)
RBC: 3.48 10*6/uL — ABNORMAL LOW (ref 3.70–5.45)
RDW: 16.5 % — ABNORMAL HIGH (ref 11.2–14.5)
WBC: 5.7 10*3/uL (ref 3.9–10.3)

## 2009-09-22 LAB — COMPREHENSIVE METABOLIC PANEL
ALT: 42 U/L — ABNORMAL HIGH (ref 0–35)
AST: 22 U/L (ref 0–37)
Creatinine, Ser: 0.77 mg/dL (ref 0.40–1.20)
Sodium: 139 mEq/L (ref 135–145)
Total Bilirubin: 0.9 mg/dL (ref 0.3–1.2)
Total Protein: 5.9 g/dL — ABNORMAL LOW (ref 6.0–8.3)

## 2009-09-26 LAB — CBC WITH DIFFERENTIAL/PLATELET
Basophils Absolute: 0.1 10*3/uL (ref 0.0–0.1)
EOS%: 0.4 % (ref 0.0–7.0)
HCT: 34.4 % — ABNORMAL LOW (ref 34.8–46.6)
HGB: 11.4 g/dL — ABNORMAL LOW (ref 11.6–15.9)
LYMPH%: 21.5 % (ref 14.0–49.7)
MCH: 32.5 pg (ref 25.1–34.0)
MCHC: 33.1 g/dL (ref 31.5–36.0)
MCV: 98 fL (ref 79.5–101.0)
MONO%: 12.3 % (ref 0.0–14.0)
NEUT%: 65.1 % (ref 38.4–76.8)
Platelets: 112 10*3/uL — ABNORMAL LOW (ref 145–400)
lymph#: 1.6 10*3/uL (ref 0.9–3.3)

## 2009-10-03 ENCOUNTER — Encounter: Payer: Self-pay | Admitting: Family Medicine

## 2009-10-03 LAB — CBC WITH DIFFERENTIAL/PLATELET
BASO%: 0.2 % (ref 0.0–2.0)
Basophils Absolute: 0 10*3/uL (ref 0.0–0.1)
EOS%: 1.3 % (ref 0.0–7.0)
HGB: 11.9 g/dL (ref 11.6–15.9)
MCH: 33.3 pg (ref 25.1–34.0)
MONO%: 6.2 % (ref 0.0–14.0)
RBC: 3.57 10*6/uL — ABNORMAL LOW (ref 3.70–5.45)
RDW: 16.1 % — ABNORMAL HIGH (ref 11.2–14.5)
lymph#: 1.1 10*3/uL (ref 0.9–3.3)

## 2009-10-03 LAB — COMPREHENSIVE METABOLIC PANEL
AST: 21 U/L (ref 0–37)
Alkaline Phosphatase: 61 U/L (ref 39–117)
BUN: 11 mg/dL (ref 6–23)
Calcium: 9.2 mg/dL (ref 8.4–10.5)
Creatinine, Ser: 0.9 mg/dL (ref 0.40–1.20)
Glucose, Bld: 116 mg/dL — ABNORMAL HIGH (ref 70–99)

## 2009-10-11 ENCOUNTER — Ambulatory Visit: Payer: Self-pay | Admitting: Internal Medicine

## 2009-10-11 LAB — CBC WITH DIFFERENTIAL/PLATELET
Basophils Absolute: 0 10*3/uL (ref 0.0–0.1)
EOS%: 1.6 % (ref 0.0–7.0)
Eosinophils Absolute: 0.2 10*3/uL (ref 0.0–0.5)
HCT: 35.1 % (ref 34.8–46.6)
HGB: 11.7 g/dL (ref 11.6–15.9)
MCH: 33.7 pg (ref 25.1–34.0)
MCV: 100.6 fL (ref 79.5–101.0)
NEUT#: 7.6 10*3/uL — ABNORMAL HIGH (ref 1.5–6.5)
NEUT%: 81.1 % — ABNORMAL HIGH (ref 38.4–76.8)
lymph#: 1 10*3/uL (ref 0.9–3.3)

## 2009-10-12 LAB — COMPREHENSIVE METABOLIC PANEL
AST: 15 U/L (ref 0–37)
Albumin: 3.9 g/dL (ref 3.5–5.2)
BUN: 9 mg/dL (ref 6–23)
CO2: 23 mEq/L (ref 19–32)
Calcium: 9.4 mg/dL (ref 8.4–10.5)
Chloride: 102 mEq/L (ref 96–112)
Creatinine, Ser: 0.74 mg/dL (ref 0.40–1.20)
Glucose, Bld: 142 mg/dL — ABNORMAL HIGH (ref 70–99)
Potassium: 3.3 mEq/L — ABNORMAL LOW (ref 3.5–5.3)

## 2009-10-12 LAB — IGG, IGA, IGM
IgA: 181 mg/dL (ref 68–378)
IgG (Immunoglobin G), Serum: 428 mg/dL — ABNORMAL LOW (ref 694–1618)
IgM, Serum: 25 mg/dL — ABNORMAL LOW (ref 60–263)

## 2009-10-12 LAB — KAPPA/LAMBDA LIGHT CHAINS: Kappa free light chain: <0.57 mg/dL (ref 0.33–1.94)

## 2009-10-14 ENCOUNTER — Telehealth: Payer: Self-pay | Admitting: Family Medicine

## 2009-10-14 ENCOUNTER — Encounter: Payer: Self-pay | Admitting: Internal Medicine

## 2009-10-14 ENCOUNTER — Encounter: Payer: Self-pay | Admitting: Family Medicine

## 2009-10-17 ENCOUNTER — Encounter: Payer: Self-pay | Admitting: Internal Medicine

## 2009-10-18 ENCOUNTER — Ambulatory Visit: Payer: Self-pay | Admitting: Oncology

## 2009-10-18 LAB — CBC WITH DIFFERENTIAL/PLATELET
BASO%: 0.3 % (ref 0.0–2.0)
HCT: 37.4 % (ref 34.8–46.6)
LYMPH%: 16.7 % (ref 14.0–49.7)
MCH: 32.4 pg (ref 25.1–34.0)
MCHC: 32.4 g/dL (ref 31.5–36.0)
MCV: 100.3 fL (ref 79.5–101.0)
MONO%: 7 % (ref 0.0–14.0)
NEUT%: 72.4 % (ref 38.4–76.8)
Platelets: 91 10*3/uL — ABNORMAL LOW (ref 145–400)
RBC: 3.73 10*6/uL (ref 3.70–5.45)

## 2009-10-24 ENCOUNTER — Ambulatory Visit (HOSPITAL_COMMUNITY): Admission: RE | Admit: 2009-10-24 | Discharge: 2009-10-24 | Payer: Self-pay | Admitting: Oncology

## 2009-10-24 LAB — COMPREHENSIVE METABOLIC PANEL
AST: 16 U/L (ref 0–37)
Albumin: 3.3 g/dL — ABNORMAL LOW (ref 3.5–5.2)
Alkaline Phosphatase: 55 U/L (ref 39–117)
CO2: 31 mEq/L (ref 19–32)
Glucose, Bld: 174 mg/dL — ABNORMAL HIGH (ref 70–99)
Potassium: 4 mEq/L (ref 3.5–5.3)
Sodium: 142 mEq/L (ref 135–145)
Total Bilirubin: 1.2 mg/dL (ref 0.3–1.2)
Total Protein: 5.9 g/dL — ABNORMAL LOW (ref 6.0–8.3)

## 2009-10-24 LAB — KAPPA/LAMBDA LIGHT CHAINS: Lambda Free Lght Chn: 0.8 mg/dL (ref 0.57–2.63)

## 2009-10-24 LAB — CBC WITH DIFFERENTIAL/PLATELET
Basophils Absolute: 0 10*3/uL (ref 0.0–0.1)
EOS%: 0.3 % (ref 0.0–7.0)
Eosinophils Absolute: 0 10*3/uL (ref 0.0–0.5)
LYMPH%: 17 % (ref 14.0–49.7)
MCH: 34 pg (ref 25.1–34.0)
MCHC: 33.4 g/dL (ref 31.5–36.0)
MCV: 101.8 fL — ABNORMAL HIGH (ref 79.5–101.0)
MONO%: 14.3 % — ABNORMAL HIGH (ref 0.0–14.0)
NEUT#: 4.2 10*3/uL (ref 1.5–6.5)
RBC: 3.4 10*6/uL — ABNORMAL LOW (ref 3.70–5.45)
RDW: 15.8 % — ABNORMAL HIGH (ref 11.2–14.5)
lymph#: 1 10*3/uL (ref 0.9–3.3)

## 2009-10-24 LAB — IGG, IGA, IGM: IgM, Serum: 24 mg/dL — ABNORMAL LOW (ref 60–263)

## 2009-10-31 ENCOUNTER — Encounter: Payer: Self-pay | Admitting: Family Medicine

## 2009-10-31 HISTORY — PX: COLONOSCOPY: SHX174

## 2009-11-01 ENCOUNTER — Encounter: Payer: Self-pay | Admitting: Family Medicine

## 2009-11-01 LAB — CBC WITH DIFFERENTIAL/PLATELET
Eosinophils Absolute: 0.1 10*3/uL (ref 0.0–0.5)
MONO#: 0.4 10*3/uL (ref 0.1–0.9)
NEUT#: 7.3 10*3/uL — ABNORMAL HIGH (ref 1.5–6.5)
RBC: 3.75 10*6/uL (ref 3.70–5.45)
RDW: 15.9 % — ABNORMAL HIGH (ref 11.2–14.5)
WBC: 9 10*3/uL (ref 3.9–10.3)

## 2009-11-01 LAB — COMPREHENSIVE METABOLIC PANEL
Albumin: 3.6 g/dL (ref 3.5–5.2)
Alkaline Phosphatase: 56 U/L (ref 39–117)
CO2: 31 mEq/L (ref 19–32)
Chloride: 103 mEq/L (ref 96–112)
Glucose, Bld: 185 mg/dL — ABNORMAL HIGH (ref 70–99)
Potassium: 3.7 mEq/L (ref 3.5–5.3)
Sodium: 140 mEq/L (ref 135–145)
Total Protein: 6.1 g/dL (ref 6.0–8.3)

## 2009-11-07 ENCOUNTER — Encounter: Payer: Self-pay | Admitting: Internal Medicine

## 2009-11-07 ENCOUNTER — Ambulatory Visit (HOSPITAL_COMMUNITY): Admission: RE | Admit: 2009-11-07 | Discharge: 2009-11-07 | Payer: Self-pay | Admitting: Internal Medicine

## 2009-11-07 ENCOUNTER — Ambulatory Visit: Payer: Self-pay | Admitting: Internal Medicine

## 2009-11-08 LAB — CBC WITH DIFFERENTIAL/PLATELET
Basophils Absolute: 0 10*3/uL (ref 0.0–0.1)
EOS%: 1.9 % (ref 0.0–7.0)
HCT: 36.6 % (ref 34.8–46.6)
HGB: 12 g/dL (ref 11.6–15.9)
MCH: 32.5 pg (ref 25.1–34.0)
NEUT%: 72.6 % (ref 38.4–76.8)
lymph#: 1.2 10*3/uL (ref 0.9–3.3)

## 2009-11-09 ENCOUNTER — Encounter: Payer: Self-pay | Admitting: Internal Medicine

## 2009-11-14 ENCOUNTER — Ambulatory Visit: Payer: Self-pay | Admitting: Oncology

## 2009-11-14 LAB — CBC WITH DIFFERENTIAL/PLATELET
Basophils Absolute: 0 10*3/uL (ref 0.0–0.1)
Eosinophils Absolute: 0.3 10*3/uL (ref 0.0–0.5)
HGB: 11.9 g/dL (ref 11.6–15.9)
LYMPH%: 17.5 % (ref 14.0–49.7)
MCV: 99.5 fL (ref 79.5–101.0)
MONO%: 8.1 % (ref 0.0–14.0)
NEUT#: 5.3 10*3/uL (ref 1.5–6.5)
NEUT%: 70.2 % (ref 38.4–76.8)
Platelets: 72 10*3/uL — ABNORMAL LOW (ref 145–400)

## 2009-11-17 ENCOUNTER — Other Ambulatory Visit: Admission: RE | Admit: 2009-11-17 | Discharge: 2009-11-17 | Payer: Self-pay | Admitting: Family Medicine

## 2009-11-17 ENCOUNTER — Ambulatory Visit: Payer: Self-pay | Admitting: Family Medicine

## 2009-11-18 ENCOUNTER — Telehealth: Payer: Self-pay | Admitting: Family Medicine

## 2009-11-18 ENCOUNTER — Encounter: Payer: Self-pay | Admitting: Family Medicine

## 2009-11-21 LAB — CBC WITH DIFFERENTIAL/PLATELET
BASO%: 0.3 % (ref 0.0–2.0)
LYMPH%: 19.6 % (ref 14.0–49.7)
MCHC: 33.7 g/dL (ref 31.5–36.0)
MCV: 100.5 fL (ref 79.5–101.0)
MONO%: 13.5 % (ref 0.0–14.0)
Platelets: 97 10*3/uL — ABNORMAL LOW (ref 145–400)
RBC: 3.32 10*6/uL — ABNORMAL LOW (ref 3.70–5.45)

## 2009-11-28 LAB — CBC WITH DIFFERENTIAL/PLATELET
BASO%: 0.7 % (ref 0.0–2.0)
EOS%: 1.2 % (ref 0.0–7.0)
LYMPH%: 18.2 % (ref 14.0–49.7)
MCH: 32.3 pg (ref 25.1–34.0)
MCHC: 32.4 g/dL (ref 31.5–36.0)
MONO#: 0.4 10*3/uL (ref 0.1–0.9)
Platelets: 106 10*3/uL — ABNORMAL LOW (ref 145–400)
RBC: 3.71 10*6/uL (ref 3.70–5.45)
WBC: 6 10*3/uL (ref 3.9–10.3)
lymph#: 1.1 10*3/uL (ref 0.9–3.3)
nRBC: 1 % — ABNORMAL HIGH (ref 0–0)

## 2009-11-29 ENCOUNTER — Telehealth: Payer: Self-pay | Admitting: Family Medicine

## 2009-12-05 LAB — CBC WITH DIFFERENTIAL/PLATELET
BASO%: 0.1 % (ref 0.0–2.0)
LYMPH%: 12.4 % — ABNORMAL LOW (ref 14.0–49.7)
MCHC: 33.4 g/dL (ref 31.5–36.0)
MONO#: 1.1 10*3/uL — ABNORMAL HIGH (ref 0.1–0.9)
RBC: 3.65 10*6/uL — ABNORMAL LOW (ref 3.70–5.45)
RDW: 16.1 % — ABNORMAL HIGH (ref 11.2–14.5)
WBC: 9.4 10*3/uL (ref 3.9–10.3)
lymph#: 1.2 10*3/uL (ref 0.9–3.3)

## 2009-12-05 LAB — COMPREHENSIVE METABOLIC PANEL
ALT: 49 U/L — ABNORMAL HIGH (ref 0–35)
CO2: 28 mEq/L (ref 19–32)
Chloride: 105 mEq/L (ref 96–112)
Sodium: 141 mEq/L (ref 135–145)
Total Bilirubin: 1.2 mg/dL (ref 0.3–1.2)
Total Protein: 6 g/dL (ref 6.0–8.3)

## 2009-12-06 LAB — IGG, IGA, IGM: IgG (Immunoglobin G), Serum: 354 mg/dL — ABNORMAL LOW (ref 694–1618)

## 2009-12-08 ENCOUNTER — Telehealth (INDEPENDENT_AMBULATORY_CARE_PROVIDER_SITE_OTHER): Payer: Self-pay | Admitting: *Deleted

## 2009-12-12 LAB — CBC WITH DIFFERENTIAL/PLATELET
Basophils Absolute: 0 10*3/uL (ref 0.0–0.1)
EOS%: 3.7 % (ref 0.0–7.0)
HCT: 38.1 % (ref 34.8–46.6)
HGB: 12.5 g/dL (ref 11.6–15.9)
LYMPH%: 18.3 % (ref 14.0–49.7)
MCH: 32.6 pg (ref 25.1–34.0)
MCHC: 32.8 g/dL (ref 31.5–36.0)
MCV: 99.2 fL (ref 79.5–101.0)
MONO%: 10.2 % (ref 0.0–14.0)
NEUT%: 67.4 % (ref 38.4–76.8)
Platelets: 84 10*3/uL — ABNORMAL LOW (ref 145–400)
lymph#: 1.5 10*3/uL (ref 0.9–3.3)

## 2009-12-15 ENCOUNTER — Ambulatory Visit: Payer: Self-pay | Admitting: Oncology

## 2009-12-19 LAB — CBC WITH DIFFERENTIAL/PLATELET
BASO%: 0.6 % (ref 0.0–2.0)
Basophils Absolute: 0 10*3/uL (ref 0.0–0.1)
EOS%: 1 % (ref 0.0–7.0)
HGB: 12.1 g/dL (ref 11.6–15.9)
MCH: 32.4 pg (ref 25.1–34.0)
MCHC: 32.9 g/dL (ref 31.5–36.0)
MCV: 98.4 fL (ref 79.5–101.0)
MONO%: 10.5 % (ref 0.0–14.0)
RDW: 14.6 % — ABNORMAL HIGH (ref 11.2–14.5)

## 2009-12-20 ENCOUNTER — Telehealth: Payer: Self-pay | Admitting: Family Medicine

## 2009-12-26 LAB — CBC WITH DIFFERENTIAL/PLATELET
EOS%: 0.8 % (ref 0.0–7.0)
Eosinophils Absolute: 0.1 10*3/uL (ref 0.0–0.5)
MCH: 32 pg (ref 25.1–34.0)
MCV: 100.3 fL (ref 79.5–101.0)
MONO%: 6.1 % (ref 0.0–14.0)
NEUT#: 7 10*3/uL — ABNORMAL HIGH (ref 1.5–6.5)
RBC: 3.69 10*6/uL — ABNORMAL LOW (ref 3.70–5.45)
RDW: 14.6 % — ABNORMAL HIGH (ref 11.2–14.5)

## 2009-12-28 ENCOUNTER — Ambulatory Visit: Admission: RE | Admit: 2009-12-28 | Discharge: 2009-12-28 | Payer: Self-pay | Admitting: Gynecologic Oncology

## 2009-12-28 ENCOUNTER — Encounter: Payer: Self-pay | Admitting: Family Medicine

## 2010-01-02 LAB — CBC WITH DIFFERENTIAL/PLATELET
BASO%: 0.2 % (ref 0.0–2.0)
Basophils Absolute: 0 10*3/uL (ref 0.0–0.1)
HCT: 38.5 % (ref 34.8–46.6)
LYMPH%: 11.5 % — ABNORMAL LOW (ref 14.0–49.7)
MCHC: 33.2 g/dL (ref 31.5–36.0)
MONO#: 1.5 10*3/uL — ABNORMAL HIGH (ref 0.1–0.9)
NEUT%: 72.8 % (ref 38.4–76.8)
Platelets: 118 10*3/uL — ABNORMAL LOW (ref 145–400)
WBC: 11.3 10*3/uL — ABNORMAL HIGH (ref 3.9–10.3)

## 2010-01-11 ENCOUNTER — Encounter: Payer: Self-pay | Admitting: Family Medicine

## 2010-01-11 LAB — CBC WITH DIFFERENTIAL/PLATELET
BASO%: 0.1 % (ref 0.0–2.0)
EOS%: 2.5 % (ref 0.0–7.0)
HCT: 36.7 % (ref 34.8–46.6)
LYMPH%: 13.1 % — ABNORMAL LOW (ref 14.0–49.7)
MCH: 33.4 pg (ref 25.1–34.0)
MCHC: 32.9 g/dL (ref 31.5–36.0)
MCV: 101.3 fL — ABNORMAL HIGH (ref 79.5–101.0)
MONO%: 8.4 % (ref 0.0–14.0)
NEUT%: 75.9 % (ref 38.4–76.8)
Platelets: 93 10*3/uL — ABNORMAL LOW (ref 145–400)
lymph#: 0.8 10*3/uL — ABNORMAL LOW (ref 0.9–3.3)

## 2010-01-12 LAB — IGG, IGA, IGM
IgA: 173 mg/dL (ref 68–378)
IgM, Serum: 18 mg/dL — ABNORMAL LOW (ref 60–263)

## 2010-01-12 LAB — COMPREHENSIVE METABOLIC PANEL
ALT: 39 U/L — ABNORMAL HIGH (ref 0–35)
AST: 18 U/L (ref 0–37)
Alkaline Phosphatase: 56 U/L (ref 39–117)
BUN: 10 mg/dL (ref 6–23)
Creatinine, Ser: 0.84 mg/dL (ref 0.40–1.20)
Total Bilirubin: 1.1 mg/dL (ref 0.3–1.2)

## 2010-01-12 LAB — KAPPA/LAMBDA LIGHT CHAINS
Kappa free light chain: 0.72 mg/dL (ref 0.33–1.94)
Kappa:Lambda Ratio: 0.88 (ref 0.26–1.65)

## 2010-01-13 ENCOUNTER — Encounter: Payer: Self-pay | Admitting: Family Medicine

## 2010-01-16 ENCOUNTER — Ambulatory Visit: Payer: Self-pay | Admitting: Oncology

## 2010-01-16 ENCOUNTER — Other Ambulatory Visit: Payer: Self-pay | Admitting: Oncology

## 2010-01-16 LAB — CBC WITH DIFFERENTIAL/PLATELET
BASO%: 0.5 % (ref 0.0–2.0)
EOS%: 0.6 % (ref 0.0–7.0)
LYMPH%: 19.1 % (ref 14.0–49.7)
MCH: 32.2 pg (ref 25.1–34.0)
MCHC: 32.5 g/dL (ref 31.5–36.0)
MONO#: 1.1 10*3/uL — ABNORMAL HIGH (ref 0.1–0.9)
RBC: 3.69 10*6/uL — ABNORMAL LOW (ref 3.70–5.45)
WBC: 9.8 10*3/uL (ref 3.9–10.3)
lymph#: 1.9 10*3/uL (ref 0.9–3.3)
nRBC: 1 % — ABNORMAL HIGH (ref 0–0)

## 2010-01-23 LAB — CBC WITH DIFFERENTIAL/PLATELET
BASO%: 0.5 % (ref 0.0–2.0)
EOS%: 0.8 % (ref 0.0–7.0)
Eosinophils Absolute: 0.1 10*3/uL (ref 0.0–0.5)
LYMPH%: 14.6 % (ref 14.0–49.7)
MCHC: 33.5 g/dL (ref 31.5–36.0)
MCV: 101 fL (ref 79.5–101.0)
MONO%: 5.4 % (ref 0.0–14.0)
NEUT#: 6.2 10*3/uL (ref 1.5–6.5)
Platelets: 110 10*3/uL — ABNORMAL LOW (ref 145–400)
RBC: 3.58 10*6/uL — ABNORMAL LOW (ref 3.70–5.45)
RDW: 16.4 % — ABNORMAL HIGH (ref 11.2–14.5)

## 2010-01-30 LAB — CBC WITH DIFFERENTIAL/PLATELET
BASO%: 0 % (ref 0.0–2.0)
Eosinophils Absolute: 0.2 10*3/uL (ref 0.0–0.5)
LYMPH%: 14.2 % (ref 14.0–49.7)
MCHC: 33.8 g/dL (ref 31.5–36.0)
MONO#: 1.3 10*3/uL — ABNORMAL HIGH (ref 0.1–0.9)
NEUT#: 6.6 10*3/uL — ABNORMAL HIGH (ref 1.5–6.5)
Platelets: 113 10*3/uL — ABNORMAL LOW (ref 145–400)
RBC: 3.72 10*6/uL (ref 3.70–5.45)
RDW: 15.9 % — ABNORMAL HIGH (ref 11.2–14.5)
WBC: 9.4 10*3/uL (ref 3.9–10.3)
lymph#: 1.3 10*3/uL (ref 0.9–3.3)

## 2010-02-06 LAB — CBC WITH DIFFERENTIAL/PLATELET
BASO%: 0.3 % (ref 0.0–2.0)
Basophils Absolute: 0 10*3/uL (ref 0.0–0.1)
EOS%: 2.6 % (ref 0.0–7.0)
HCT: 35.4 % (ref 34.8–46.6)
HGB: 11.9 g/dL (ref 11.6–15.9)
MCH: 33.9 pg (ref 25.1–34.0)
MCHC: 33.7 g/dL (ref 31.5–36.0)
MCV: 100.5 fL (ref 79.5–101.0)
MONO%: 8.7 % (ref 0.0–14.0)
NEUT%: 77.1 % — ABNORMAL HIGH (ref 38.4–76.8)
lymph#: 1 10*3/uL (ref 0.9–3.3)

## 2010-02-10 LAB — CBC WITH DIFFERENTIAL/PLATELET
BASO%: 0 % (ref 0.0–2.0)
EOS%: 0 % (ref 0.0–7.0)
MCH: 33.5 pg (ref 25.1–34.0)
MCHC: 33.1 g/dL (ref 31.5–36.0)
MCV: 101.2 fL — ABNORMAL HIGH (ref 79.5–101.0)
MONO%: 0.9 % (ref 0.0–14.0)
RBC: 3.75 10*6/uL (ref 3.70–5.45)
RDW: 15.5 % — ABNORMAL HIGH (ref 11.2–14.5)
lymph#: 0.7 10*3/uL — ABNORMAL LOW (ref 0.9–3.3)

## 2010-02-14 ENCOUNTER — Encounter: Payer: Self-pay | Admitting: Family Medicine

## 2010-02-15 ENCOUNTER — Ambulatory Visit (HOSPITAL_BASED_OUTPATIENT_CLINIC_OR_DEPARTMENT_OTHER): Admission: RE | Admit: 2010-02-15 | Discharge: 2010-02-15 | Payer: Self-pay | Admitting: Gynecologic Oncology

## 2010-02-15 ENCOUNTER — Encounter: Payer: Self-pay | Admitting: Family Medicine

## 2010-02-15 HISTORY — PX: OTHER SURGICAL HISTORY: SHX169

## 2010-02-15 LAB — COMPREHENSIVE METABOLIC PANEL
ALT: 38 U/L — ABNORMAL HIGH (ref 0–35)
BUN: 18 mg/dL (ref 6–23)
CO2: 21 mEq/L (ref 19–32)
Calcium: 9.6 mg/dL (ref 8.4–10.5)
Chloride: 103 mEq/L (ref 96–112)
Creatinine, Ser: 1.02 mg/dL (ref 0.40–1.20)
Glucose, Bld: 223 mg/dL — ABNORMAL HIGH (ref 70–99)

## 2010-02-15 LAB — KAPPA/LAMBDA LIGHT CHAINS

## 2010-02-15 LAB — IGG, IGA, IGM
IgA: 215 mg/dL (ref 68–378)
IgG (Immunoglobin G), Serum: 432 mg/dL — ABNORMAL LOW (ref 694–1618)
IgM, Serum: 21 mg/dL — ABNORMAL LOW (ref 60–263)

## 2010-03-01 ENCOUNTER — Ambulatory Visit: Payer: Self-pay | Admitting: Physician Assistant

## 2010-03-08 ENCOUNTER — Ambulatory Visit: Payer: Self-pay | Admitting: Oncology

## 2010-03-09 ENCOUNTER — Ambulatory Visit: Admission: RE | Admit: 2010-03-09 | Discharge: 2010-03-09 | Payer: Self-pay | Admitting: Gynecologic Oncology

## 2010-03-10 ENCOUNTER — Encounter: Payer: Self-pay | Admitting: Family Medicine

## 2010-03-10 LAB — CBC WITH DIFFERENTIAL/PLATELET
BASO%: 0 % (ref 0.0–2.0)
Basophils Absolute: 0 10*3/uL (ref 0.0–0.1)
EOS%: 0 % (ref 0.0–7.0)
HGB: 12.1 g/dL (ref 11.6–15.9)
MCH: 34 pg (ref 25.1–34.0)
RBC: 3.55 10*6/uL — ABNORMAL LOW (ref 3.70–5.45)
RDW: 16 % — ABNORMAL HIGH (ref 11.2–14.5)
lymph#: 0.5 10*3/uL — ABNORMAL LOW (ref 0.9–3.3)

## 2010-03-10 LAB — BASIC METABOLIC PANEL
BUN: 20 mg/dL (ref 6–23)
Chloride: 104 mEq/L (ref 96–112)
Potassium: 5.5 mEq/L — ABNORMAL HIGH (ref 3.5–5.3)
Sodium: 138 mEq/L (ref 135–145)

## 2010-03-13 ENCOUNTER — Encounter: Payer: Self-pay | Admitting: Family Medicine

## 2010-03-17 DIAGNOSIS — E876 Hypokalemia: Secondary | ICD-10-CM | POA: Insufficient documentation

## 2010-03-17 LAB — CONVERTED CEMR LAB
CO2: 26 meq/L (ref 19–32)
Glucose, Bld: 140 mg/dL — ABNORMAL HIGH (ref 70–99)
Potassium: 2.9 meq/L — ABNORMAL LOW (ref 3.5–5.3)
Sodium: 143 meq/L (ref 135–145)

## 2010-03-21 LAB — CBC WITH DIFFERENTIAL/PLATELET
Basophils Absolute: 0 10*3/uL (ref 0.0–0.1)
Eosinophils Absolute: 0.1 10*3/uL (ref 0.0–0.5)
HCT: 32.8 % — ABNORMAL LOW (ref 34.8–46.6)
HGB: 11.1 g/dL — ABNORMAL LOW (ref 11.6–15.9)
LYMPH%: 10.1 % — ABNORMAL LOW (ref 14.0–49.7)
MCV: 102.7 fL — ABNORMAL HIGH (ref 79.5–101.0)
MONO#: 0.5 10*3/uL (ref 0.1–0.9)
MONO%: 4.3 % (ref 0.0–14.0)
NEUT#: 9 10*3/uL — ABNORMAL HIGH (ref 1.5–6.5)
Platelets: 60 10*3/uL — ABNORMAL LOW (ref 145–400)
WBC: 10.6 10*3/uL — ABNORMAL HIGH (ref 3.9–10.3)

## 2010-03-23 ENCOUNTER — Encounter: Payer: Self-pay | Admitting: Family Medicine

## 2010-03-24 ENCOUNTER — Encounter: Payer: Self-pay | Admitting: Family Medicine

## 2010-03-28 LAB — CBC WITH DIFFERENTIAL/PLATELET
BASO%: 0.3 % (ref 0.0–2.0)
Eosinophils Absolute: 0.1 10*3/uL (ref 0.0–0.5)
HCT: 33.5 % — ABNORMAL LOW (ref 34.8–46.6)
LYMPH%: 12.7 % — ABNORMAL LOW (ref 14.0–49.7)
MCHC: 32.5 g/dL (ref 31.5–36.0)
MCV: 101.8 fL — ABNORMAL HIGH (ref 79.5–101.0)
MONO#: 0.9 10*3/uL (ref 0.1–0.9)
MONO%: 8.2 % (ref 0.0–14.0)
NEUT%: 78 % — ABNORMAL HIGH (ref 38.4–76.8)
Platelets: 50 10*3/uL — ABNORMAL LOW (ref 145–400)
RBC: 3.29 10*6/uL — ABNORMAL LOW (ref 3.70–5.45)
nRBC: 2 % — ABNORMAL HIGH (ref 0–0)

## 2010-04-04 LAB — COMPREHENSIVE METABOLIC PANEL
ALT: 26 U/L (ref 0–35)
AST: 18 U/L (ref 0–37)
Albumin: 3.6 g/dL (ref 3.5–5.2)
Alkaline Phosphatase: 51 U/L (ref 39–117)
Calcium: 9.3 mg/dL (ref 8.4–10.5)
Chloride: 106 mEq/L (ref 96–112)
Potassium: 3.3 mEq/L — ABNORMAL LOW (ref 3.5–5.3)
Sodium: 138 mEq/L (ref 135–145)

## 2010-04-04 LAB — CBC WITH DIFFERENTIAL/PLATELET
BASO%: 0.2 % (ref 0.0–2.0)
EOS%: 0.1 % (ref 0.0–7.0)
HGB: 11.4 g/dL — ABNORMAL LOW (ref 11.6–15.9)
MCH: 35 pg — ABNORMAL HIGH (ref 25.1–34.0)
MCHC: 33.7 g/dL (ref 31.5–36.0)
RBC: 3.24 10*6/uL — ABNORMAL LOW (ref 3.70–5.45)
RDW: 17.4 % — ABNORMAL HIGH (ref 11.2–14.5)
lymph#: 1.5 10*3/uL (ref 0.9–3.3)

## 2010-04-05 LAB — KAPPA/LAMBDA LIGHT CHAINS
Kappa:Lambda Ratio: 0.5 (ref 0.26–1.65)
Lambda Free Lght Chn: 1.13 mg/dL (ref 0.57–2.63)

## 2010-04-05 LAB — IGG, IGA, IGM: IgM, Serum: 30 mg/dL — ABNORMAL LOW (ref 60–263)

## 2010-04-06 ENCOUNTER — Encounter: Payer: Self-pay | Admitting: Family Medicine

## 2010-04-07 ENCOUNTER — Encounter: Payer: Self-pay | Admitting: Family Medicine

## 2010-04-07 LAB — URIC ACID: Uric Acid, Serum: 7.8 mg/dL — ABNORMAL HIGH (ref 2.4–7.0)

## 2010-04-17 ENCOUNTER — Telehealth: Payer: Self-pay | Admitting: Family Medicine

## 2010-04-24 ENCOUNTER — Ambulatory Visit: Payer: Self-pay | Admitting: Family Medicine

## 2010-04-24 DIAGNOSIS — H6120 Impacted cerumen, unspecified ear: Secondary | ICD-10-CM

## 2010-04-25 LAB — CONVERTED CEMR LAB
Creatinine, Ser: 0.91 mg/dL (ref 0.40–1.20)
Hgb A1c MFr Bld: 6.4 % — ABNORMAL HIGH (ref <5.7)

## 2010-04-30 DIAGNOSIS — J309 Allergic rhinitis, unspecified: Secondary | ICD-10-CM | POA: Insufficient documentation

## 2010-05-11 ENCOUNTER — Ambulatory Visit: Payer: Self-pay | Admitting: Oncology

## 2010-05-12 ENCOUNTER — Encounter: Payer: Self-pay | Admitting: Family Medicine

## 2010-05-12 LAB — COMPREHENSIVE METABOLIC PANEL
Albumin: 4.2 g/dL (ref 3.5–5.2)
Alkaline Phosphatase: 81 U/L (ref 39–117)
BUN: 18 mg/dL (ref 6–23)
Glucose, Bld: 237 mg/dL — ABNORMAL HIGH (ref 70–99)
Total Bilirubin: 0.9 mg/dL (ref 0.3–1.2)

## 2010-05-12 LAB — CBC WITH DIFFERENTIAL/PLATELET
Basophils Absolute: 0 10*3/uL (ref 0.0–0.1)
EOS%: 0 % (ref 0.0–7.0)
Eosinophils Absolute: 0 10*3/uL (ref 0.0–0.5)
HCT: 36.2 % (ref 34.8–46.6)
HGB: 12.3 g/dL (ref 11.6–15.9)
MCH: 34.5 pg — ABNORMAL HIGH (ref 25.1–34.0)
NEUT#: 6.4 10*3/uL (ref 1.5–6.5)
NEUT%: 89.2 % — ABNORMAL HIGH (ref 38.4–76.8)
lymph#: 0.7 10*3/uL — ABNORMAL LOW (ref 0.9–3.3)

## 2010-05-15 ENCOUNTER — Telehealth: Payer: Self-pay | Admitting: Family Medicine

## 2010-05-16 ENCOUNTER — Encounter: Payer: Self-pay | Admitting: Family Medicine

## 2010-05-16 ENCOUNTER — Telehealth: Payer: Self-pay | Admitting: Family Medicine

## 2010-05-19 LAB — CONVERTED CEMR LAB
Hep A IgM: NEGATIVE
Hep B C IgM: NEGATIVE

## 2010-05-22 ENCOUNTER — Ambulatory Visit (HOSPITAL_COMMUNITY): Admission: RE | Admit: 2010-05-22 | Discharge: 2010-05-22 | Payer: Self-pay | Admitting: Family Medicine

## 2010-05-22 LAB — COMPREHENSIVE METABOLIC PANEL
ALT: 43 U/L — ABNORMAL HIGH (ref 0–35)
Albumin: 3.8 g/dL (ref 3.5–5.2)
CO2: 26 mEq/L (ref 19–32)
Calcium: 9.2 mg/dL (ref 8.4–10.5)
Chloride: 102 mEq/L (ref 96–112)
Creatinine, Ser: 0.95 mg/dL (ref 0.40–1.20)
Potassium: 3.9 mEq/L (ref 3.5–5.3)
Total Protein: 5.9 g/dL — ABNORMAL LOW (ref 6.0–8.3)

## 2010-05-22 LAB — CBC WITH DIFFERENTIAL/PLATELET
Basophils Absolute: 0 10*3/uL (ref 0.0–0.1)
Eosinophils Absolute: 0 10*3/uL (ref 0.0–0.5)
HGB: 11.9 g/dL (ref 11.6–15.9)
MCV: 101.7 fL — ABNORMAL HIGH (ref 79.5–101.0)
MONO%: 16.3 % — ABNORMAL HIGH (ref 0.0–14.0)
NEUT#: 4.3 10*3/uL (ref 1.5–6.5)
Platelets: 135 10*3/uL — ABNORMAL LOW (ref 145–400)
RDW: 14.1 % (ref 11.2–14.5)

## 2010-05-22 LAB — TECHNOLOGIST REVIEW

## 2010-05-24 LAB — CEA: CEA: 1.1 ng/mL (ref 0.0–5.0)

## 2010-06-02 ENCOUNTER — Ambulatory Visit (HOSPITAL_COMMUNITY): Admission: RE | Admit: 2010-06-02 | Discharge: 2010-06-02 | Payer: Self-pay | Admitting: Hematology and Oncology

## 2010-06-02 LAB — COMPREHENSIVE METABOLIC PANEL
Albumin: 3.8 g/dL (ref 3.5–5.2)
Alkaline Phosphatase: 81 U/L (ref 39–117)
CO2: 30 mEq/L (ref 19–32)
Glucose, Bld: 100 mg/dL — ABNORMAL HIGH (ref 70–99)
Potassium: 3.7 mEq/L (ref 3.5–5.3)
Sodium: 139 mEq/L (ref 135–145)
Total Protein: 7 g/dL (ref 6.0–8.3)

## 2010-06-02 LAB — CBC WITH DIFFERENTIAL/PLATELET
Eosinophils Absolute: 0.1 10*3/uL (ref 0.0–0.5)
MONO#: 0.8 10*3/uL (ref 0.1–0.9)
NEUT#: 5.7 10*3/uL (ref 1.5–6.5)
RBC: 3.75 10*6/uL (ref 3.70–5.45)
RDW: 15.5 % — ABNORMAL HIGH (ref 11.2–14.5)
WBC: 8.3 10*3/uL (ref 3.9–10.3)

## 2010-06-05 LAB — IGG, IGA, IGM
IgA: 243 mg/dL (ref 68–378)
IgG (Immunoglobin G), Serum: 552 mg/dL — ABNORMAL LOW (ref 694–1618)

## 2010-06-05 LAB — KAPPA/LAMBDA LIGHT CHAINS

## 2010-06-07 ENCOUNTER — Encounter: Payer: Self-pay | Admitting: Family Medicine

## 2010-06-15 ENCOUNTER — Other Ambulatory Visit: Admission: RE | Admit: 2010-06-15 | Discharge: 2010-06-15 | Payer: Self-pay | Admitting: Gynecologic Oncology

## 2010-06-15 ENCOUNTER — Ambulatory Visit: Admission: RE | Admit: 2010-06-15 | Discharge: 2010-06-15 | Payer: Self-pay | Admitting: Gynecologic Oncology

## 2010-06-30 ENCOUNTER — Ambulatory Visit: Payer: Self-pay | Admitting: Oncology

## 2010-07-05 ENCOUNTER — Encounter: Payer: Self-pay | Admitting: Family Medicine

## 2010-07-05 ENCOUNTER — Other Ambulatory Visit: Payer: Self-pay | Admitting: Hematology and Oncology

## 2010-07-05 LAB — CBC WITH DIFFERENTIAL/PLATELET
Eosinophils Absolute: 0.1 10*3/uL (ref 0.0–0.5)
MCV: 101.1 fL — ABNORMAL HIGH (ref 79.5–101.0)
MONO#: 0.5 10*3/uL (ref 0.1–0.9)
MONO%: 6.3 % (ref 0.0–14.0)
NEUT#: 6.5 10*3/uL (ref 1.5–6.5)
RBC: 3.59 10*6/uL — ABNORMAL LOW (ref 3.70–5.45)
RDW: 15 % — ABNORMAL HIGH (ref 11.2–14.5)
WBC: 8.4 10*3/uL (ref 3.9–10.3)
lymph#: 1.2 10*3/uL (ref 0.9–3.3)

## 2010-07-05 LAB — COMPREHENSIVE METABOLIC PANEL
AST: 42 U/L — ABNORMAL HIGH (ref 0–37)
Albumin: 4.1 g/dL (ref 3.5–5.2)
Alkaline Phosphatase: 80 U/L (ref 39–117)
Chloride: 101 mEq/L (ref 96–112)
Glucose, Bld: 140 mg/dL — ABNORMAL HIGH (ref 70–99)
Potassium: 3.7 mEq/L (ref 3.5–5.3)
Sodium: 138 mEq/L (ref 135–145)
Total Protein: 6.5 g/dL (ref 6.0–8.3)

## 2010-07-10 ENCOUNTER — Ambulatory Visit (HOSPITAL_COMMUNITY): Admission: RE | Admit: 2010-07-10 | Discharge: 2010-07-10 | Payer: Self-pay | Admitting: Family Medicine

## 2010-07-13 LAB — CBC WITH DIFFERENTIAL/PLATELET
EOS%: 0.9 % (ref 0.0–7.0)
MCH: 34.2 pg — ABNORMAL HIGH (ref 25.1–34.0)
MCV: 100.6 fL (ref 79.5–101.0)
MONO%: 9.6 % (ref 0.0–14.0)
NEUT#: 5.7 10*3/uL (ref 1.5–6.5)
RBC: 3.67 10*6/uL — ABNORMAL LOW (ref 3.70–5.45)
RDW: 14.8 % — ABNORMAL HIGH (ref 11.2–14.5)

## 2010-07-31 ENCOUNTER — Ambulatory Visit: Payer: Self-pay | Admitting: Oncology

## 2010-08-02 ENCOUNTER — Encounter: Payer: Self-pay | Admitting: Family Medicine

## 2010-08-02 LAB — CBC WITH DIFFERENTIAL/PLATELET
BASO%: 0.6 % (ref 0.0–2.0)
Eosinophils Absolute: 0.2 10*3/uL (ref 0.0–0.5)
LYMPH%: 16.3 % (ref 14.0–49.7)
MCHC: 34.2 g/dL (ref 31.5–36.0)
MONO#: 1 10*3/uL — ABNORMAL HIGH (ref 0.1–0.9)
MONO%: 11.6 % (ref 0.0–14.0)
NEUT#: 6.1 10*3/uL (ref 1.5–6.5)
RBC: 3.29 10*6/uL — ABNORMAL LOW (ref 3.70–5.45)
RDW: 14.8 % — ABNORMAL HIGH (ref 11.2–14.5)
WBC: 8.9 10*3/uL (ref 3.9–10.3)

## 2010-08-02 LAB — COMPREHENSIVE METABOLIC PANEL
Albumin: 3.9 g/dL (ref 3.5–5.2)
Alkaline Phosphatase: 61 U/L (ref 39–117)
BUN: 13 mg/dL (ref 6–23)
CO2: 24 mEq/L (ref 19–32)
Glucose, Bld: 195 mg/dL — ABNORMAL HIGH (ref 70–99)
Potassium: 4.3 mEq/L (ref 3.5–5.3)
Sodium: 139 mEq/L (ref 135–145)
Total Bilirubin: 0.7 mg/dL (ref 0.3–1.2)
Total Protein: 5.8 g/dL — ABNORMAL LOW (ref 6.0–8.3)

## 2010-08-16 ENCOUNTER — Ambulatory Visit: Payer: Self-pay | Admitting: Family Medicine

## 2010-08-16 DIAGNOSIS — M899 Disorder of bone, unspecified: Secondary | ICD-10-CM | POA: Insufficient documentation

## 2010-08-16 DIAGNOSIS — M949 Disorder of cartilage, unspecified: Secondary | ICD-10-CM

## 2010-08-16 LAB — HM DIABETES FOOT EXAM

## 2010-08-21 LAB — CONVERTED CEMR LAB
BUN: 9 mg/dL (ref 6–23)
Chloride: 103 meq/L (ref 96–112)
Creatinine, Ser: 0.86 mg/dL (ref 0.40–1.20)
Hgb A1c MFr Bld: 7.2 % — ABNORMAL HIGH (ref <5.7)
Vit D, 25-Hydroxy: 28 ng/mL — ABNORMAL LOW (ref 30–89)

## 2010-08-23 LAB — COMPREHENSIVE METABOLIC PANEL
Albumin: 4 g/dL (ref 3.5–5.2)
BUN: 11 mg/dL (ref 6–23)
CO2: 21 mEq/L (ref 19–32)
Calcium: 9.4 mg/dL (ref 8.4–10.5)
Chloride: 104 mEq/L (ref 96–112)
Glucose, Bld: 140 mg/dL — ABNORMAL HIGH (ref 70–99)
Potassium: 4.3 mEq/L (ref 3.5–5.3)
Sodium: 138 mEq/L (ref 135–145)
Total Protein: 6.3 g/dL (ref 6.0–8.3)

## 2010-08-23 LAB — CBC WITH DIFFERENTIAL/PLATELET
Basophils Absolute: 0 10*3/uL (ref 0.0–0.1)
Eosinophils Absolute: 0.2 10*3/uL (ref 0.0–0.5)
HGB: 11.8 g/dL (ref 11.6–15.9)
MONO#: 0.8 10*3/uL (ref 0.1–0.9)
NEUT#: 7.1 10*3/uL — ABNORMAL HIGH (ref 1.5–6.5)
RBC: 3.54 10*6/uL — ABNORMAL LOW (ref 3.70–5.45)
RDW: 15.7 % — ABNORMAL HIGH (ref 11.2–14.5)
WBC: 9.4 10*3/uL (ref 3.9–10.3)

## 2010-09-07 ENCOUNTER — Ambulatory Visit
Admission: RE | Admit: 2010-09-07 | Discharge: 2010-09-07 | Payer: Self-pay | Source: Home / Self Care | Admitting: Gynecologic Oncology

## 2010-09-07 ENCOUNTER — Encounter: Payer: Self-pay | Admitting: Family Medicine

## 2010-09-12 ENCOUNTER — Ambulatory Visit: Payer: Self-pay | Admitting: Oncology

## 2010-09-14 LAB — CBC WITH DIFFERENTIAL/PLATELET
Basophils Absolute: 0 10*3/uL (ref 0.0–0.1)
EOS%: 1 % (ref 0.0–7.0)
HCT: 36.9 % (ref 34.8–46.6)
HGB: 11.9 g/dL (ref 11.6–15.9)
LYMPH%: 14 % (ref 14.0–49.7)
MCH: 32 pg (ref 25.1–34.0)
MCHC: 32.3 g/dL (ref 31.5–36.0)
MCV: 99.1 fL (ref 79.5–101.0)
MONO%: 8.8 % (ref 0.0–14.0)
NEUT%: 76 % (ref 38.4–76.8)

## 2010-09-14 LAB — COMPREHENSIVE METABOLIC PANEL
AST: 32 U/L (ref 0–37)
Alkaline Phosphatase: 69 U/L (ref 39–117)
BUN: 11 mg/dL (ref 6–23)
Calcium: 9.2 mg/dL (ref 8.4–10.5)
Creatinine, Ser: 0.86 mg/dL (ref 0.40–1.20)
Total Bilirubin: 1 mg/dL (ref 0.3–1.2)

## 2010-10-03 ENCOUNTER — Encounter: Payer: Self-pay | Admitting: Family Medicine

## 2010-10-04 LAB — CBC WITH DIFFERENTIAL/PLATELET
BASO%: 0.2 % (ref 0.0–2.0)
Eosinophils Absolute: 0.1 10*3/uL (ref 0.0–0.5)
HCT: 34.9 % (ref 34.8–46.6)
HGB: 11.6 g/dL (ref 11.6–15.9)
LYMPH%: 13 % — ABNORMAL LOW (ref 14.0–49.7)
MCHC: 33.1 g/dL (ref 31.5–36.0)
MONO#: 0.9 10*3/uL (ref 0.1–0.9)
NEUT#: 7.7 10*3/uL — ABNORMAL HIGH (ref 1.5–6.5)
NEUT%: 76.5 % (ref 38.4–76.8)
Platelets: 126 10*3/uL — ABNORMAL LOW (ref 145–400)
WBC: 10.1 10*3/uL (ref 3.9–10.3)
lymph#: 1.3 10*3/uL (ref 0.9–3.3)

## 2010-10-12 ENCOUNTER — Ambulatory Visit: Payer: Self-pay | Admitting: Family Medicine

## 2010-10-13 ENCOUNTER — Encounter: Payer: Self-pay | Admitting: Family Medicine

## 2010-10-16 ENCOUNTER — Ambulatory Visit: Payer: Self-pay | Admitting: Oncology

## 2010-10-27 ENCOUNTER — Encounter: Payer: Self-pay | Admitting: Family Medicine

## 2010-11-15 ENCOUNTER — Ambulatory Visit: Payer: Self-pay | Admitting: Oncology

## 2010-11-15 LAB — CBC WITH DIFFERENTIAL/PLATELET
Eosinophils Absolute: 0.1 10*3/uL (ref 0.0–0.5)
MONO#: 0.9 10*3/uL (ref 0.1–0.9)
MONO%: 8.4 % (ref 0.0–14.0)
NEUT#: 8.5 10*3/uL — ABNORMAL HIGH (ref 1.5–6.5)
RBC: 3.83 10*6/uL (ref 3.70–5.45)
RDW: 15.9 % — ABNORMAL HIGH (ref 11.2–14.5)
WBC: 11 10*3/uL — ABNORMAL HIGH (ref 3.9–10.3)

## 2010-11-15 LAB — COMPREHENSIVE METABOLIC PANEL
Albumin: 4.1 g/dL (ref 3.5–5.2)
Alkaline Phosphatase: 69 U/L (ref 39–117)
CO2: 29 mEq/L (ref 19–32)
Glucose, Bld: 118 mg/dL — ABNORMAL HIGH (ref 70–99)
Potassium: 4.2 mEq/L (ref 3.5–5.3)
Sodium: 141 mEq/L (ref 135–145)
Total Protein: 6.3 g/dL (ref 6.0–8.3)

## 2010-12-06 ENCOUNTER — Ambulatory Visit: Payer: Self-pay | Admitting: Family Medicine

## 2010-12-07 ENCOUNTER — Encounter: Payer: Self-pay | Admitting: Family Medicine

## 2010-12-07 LAB — CBC WITH DIFFERENTIAL/PLATELET
BASO%: 0.2 % (ref 0.0–2.0)
Eosinophils Absolute: 0.1 10*3/uL (ref 0.0–0.5)
HCT: 37.6 % (ref 34.8–46.6)
MCHC: 33.3 g/dL (ref 31.5–36.0)
MONO#: 0.9 10*3/uL (ref 0.1–0.9)
NEUT#: 6.8 10*3/uL — ABNORMAL HIGH (ref 1.5–6.5)
NEUT%: 77.2 % — ABNORMAL HIGH (ref 38.4–76.8)
RBC: 3.86 10*6/uL (ref 3.70–5.45)
WBC: 8.9 10*3/uL (ref 3.9–10.3)
lymph#: 1.1 10*3/uL (ref 0.9–3.3)

## 2010-12-07 LAB — COMPREHENSIVE METABOLIC PANEL
ALT: 42 U/L — ABNORMAL HIGH (ref 0–35)
CO2: 27 mEq/L (ref 19–32)
Calcium: 9.6 mg/dL (ref 8.4–10.5)
Chloride: 104 mEq/L (ref 96–112)
Sodium: 142 mEq/L (ref 135–145)
Total Protein: 6.3 g/dL (ref 6.0–8.3)

## 2010-12-14 ENCOUNTER — Ambulatory Visit: Admission: RE | Admit: 2010-12-14 | Payer: Self-pay | Source: Home / Self Care | Admitting: Gynecologic Oncology

## 2010-12-19 ENCOUNTER — Ambulatory Visit: Payer: Self-pay | Admitting: Family Medicine

## 2010-12-26 ENCOUNTER — Ambulatory Visit: Payer: Self-pay | Admitting: Oncology

## 2010-12-28 LAB — CBC WITH DIFFERENTIAL/PLATELET
EOS%: 1.1 % (ref 0.0–7.0)
MCH: 32.6 pg (ref 25.1–34.0)
MCV: 98.9 fL (ref 79.5–101.0)
MONO%: 10 % (ref 0.0–14.0)
NEUT#: 8.3 10*3/uL — ABNORMAL HIGH (ref 1.5–6.5)
RBC: 3.7 10*6/uL (ref 3.70–5.45)
RDW: 15.9 % — ABNORMAL HIGH (ref 11.2–14.5)

## 2011-01-02 ENCOUNTER — Telehealth: Payer: Self-pay | Admitting: Family Medicine

## 2011-01-09 LAB — CBC WITH DIFFERENTIAL/PLATELET
BASO%: 0.4 % (ref 0.0–2.0)
Basophils Absolute: 0.1 10*3/uL (ref 0.0–0.1)
EOS%: 1.2 % (ref 0.0–7.0)
Eosinophils Absolute: 0.2 10*3/uL (ref 0.0–0.5)
HCT: 39.6 % (ref 34.8–46.6)
HGB: 13 g/dL (ref 11.6–15.9)
LYMPH%: 11.3 % — ABNORMAL LOW (ref 14.0–49.7)
MCH: 32.2 pg (ref 25.1–34.0)
MCHC: 32.8 g/dL (ref 31.5–36.0)
MCV: 98 fL (ref 79.5–101.0)
MONO#: 1.5 10*3/uL — ABNORMAL HIGH (ref 0.1–0.9)
MONO%: 11.2 % (ref 0.0–14.0)
NEUT#: 10.2 10*3/uL — ABNORMAL HIGH (ref 1.5–6.5)
NEUT%: 75.9 % (ref 38.4–76.8)
Platelets: 173 10*3/uL (ref 145–400)
RBC: 4.04 10*6/uL (ref 3.70–5.45)
RDW: 14.4 % (ref 11.2–14.5)
WBC: 13.4 10*3/uL — ABNORMAL HIGH (ref 3.9–10.3)
lymph#: 1.5 10*3/uL (ref 0.9–3.3)
nRBC: 0 % (ref 0–0)

## 2011-01-15 ENCOUNTER — Ambulatory Visit
Admission: RE | Admit: 2011-01-15 | Discharge: 2011-01-15 | Payer: Self-pay | Source: Home / Self Care | Attending: Family Medicine | Admitting: Family Medicine

## 2011-01-15 ENCOUNTER — Ambulatory Visit (HOSPITAL_COMMUNITY)
Admission: RE | Admit: 2011-01-15 | Discharge: 2011-01-15 | Payer: Self-pay | Source: Home / Self Care | Attending: Family Medicine | Admitting: Family Medicine

## 2011-01-15 DIAGNOSIS — M25579 Pain in unspecified ankle and joints of unspecified foot: Secondary | ICD-10-CM | POA: Insufficient documentation

## 2011-01-15 LAB — COMPREHENSIVE METABOLIC PANEL
ALT: 37 U/L — ABNORMAL HIGH (ref 0–35)
AST: 22 U/L (ref 0–37)
Albumin: 4.3 g/dL (ref 3.5–5.2)
Alkaline Phosphatase: 61 U/L (ref 39–117)
BUN: 11 mg/dL (ref 6–23)
CO2: 24 mEq/L (ref 19–32)
Calcium: 10.4 mg/dL (ref 8.4–10.5)
Chloride: 104 mEq/L (ref 96–112)
Creatinine, Ser: 0.76 mg/dL (ref 0.40–1.20)
Glucose, Bld: 117 mg/dL — ABNORMAL HIGH (ref 70–99)
Potassium: 3.8 mEq/L (ref 3.5–5.3)
Sodium: 141 mEq/L (ref 135–145)
Total Bilirubin: 1.2 mg/dL (ref 0.3–1.2)
Total Protein: 6.5 g/dL (ref 6.0–8.3)

## 2011-01-15 LAB — KAPPA/LAMBDA LIGHT CHAINS
Kappa free light chain: <0.66 mg/dL (ref 0.33–1.94)
Lambda Free Lght Chn: 0.54 mg/dL — ABNORMAL LOW (ref 0.57–2.63)

## 2011-01-15 LAB — SPEP & IFE WITH QIG
Albumin ELP: 58.4 % (ref 55.8–66.1)
Alpha-1-Globulin: 4.9 % (ref 2.9–4.9)
Alpha-2-Globulin: 13.6 % — ABNORMAL HIGH (ref 7.1–11.8)
Beta 2: 8.4 % — ABNORMAL HIGH (ref 3.2–6.5)
Beta Globulin: 7.3 % — ABNORMAL HIGH (ref 4.7–7.2)
Gamma Globulin: 7.4 % — ABNORMAL LOW (ref 11.1–18.8)
IgA: 332 mg/dL (ref 68–378)
IgG (Immunoglobin G), Serum: 525 mg/dL — ABNORMAL LOW (ref 694–1618)
IgM, Serum: 30 mg/dL — ABNORMAL LOW (ref 60–263)
M-Spike, %: 0.28 g/dL
Total Protein, Serum Electrophoresis: 6.5 g/dL (ref 6.0–8.3)

## 2011-01-30 NOTE — Assessment & Plan Note (Signed)
Summary: OV   Vital Signs:  Patient profile:   60 year old female Menstrual status:  hysterectomy Weight:      232 pounds Pulse (ortho):   72 / minute Pulse rhythm:   regular BP sitting:   160 / 90  (right arm)  Primary Care Dekari Bures:  simpson   History of Present Illness: 6 day h/ head and chest congestion, no chills or fever, today yellow green drainage form the nostril, cough and chest congestion , urinary incontinence with cough , poor sleep  Pt states tha tearlier today when mpoving her sister, she felt a "pop" in her right thigh and this sconcerns her. Prior to this she has been well.She does state that today for the first time in 6 months she had an emotional meltdown and cried for no specific reason.  Allergies: 1)  ! Codeine  Review of Systems      See HPI General:  Complains of fatigue, sleep disorder, and sweats. Eyes:  Denies discharge and red eye. ENT:  Complains of nasal congestion, postnasal drainage, and sinus pressure. CV:  Denies chest pain or discomfort, palpitations, and swelling of feet. Resp:  Complains of cough and sputum productive. GI:  Denies abdominal pain, constipation, diarrhea, nausea, and vomiting. GU:  Complains of incontinence; denies dysuria and urinary frequency. MS:  Complains of joint pain and muscle weakness; acute leg painx 1 day. Psych:  Complains of anxiety; denies depression, mental problems, suicidal thoughts/plans, thoughts of violence, and unusual visions or sounds. Endo:  Denies excessive thirst and excessive urination. Heme:  Denies abnormal bruising and bleeding. Allergy:  Complains of seasonal allergies.  Physical Exam  General:  Well-developed,well-nourished,in no acute distress; alert,appropriate and cooperative throughout examination, slightly anxious. HEENT: No facial asymmetry,  EOMI,frontal and maxillary  sinus tenderness, TM's Clear, oropharynx  pink and moist.   Chest: decreased air entry , bilateral crackles and  wheezes.  CVS: S1, S2, No murmurs, No S3.   Abd: Soft, Nontender.  MS: Adequate ROM spine, hips, shoulders and knees.  Ext: No edema.   CNS: CN 2-12 intact, power tone and sensation normal throughout.   Skin: Intact, no visible lesions or rashes.  Psych: Good eye contact, normal affect.  Memory intact, not anxious or depressed appearing.    Impression & Recommendations:  Problem # 1:  ACUTE SINUSITIS, UNSPECIFIED (ICD-461.9) Assessment Comment Only  The following medications were removed from the medication list:    Tessalon Perles 100 Mg Caps (Benzonatate) .Marland Kitchen... Take 1 capsule by mouth three times a day, as needed[ Her updated medication list for this problem includes:    Penicillin V Potassium 500 Mg Tabs (Penicillin v potassium) .Marland Kitchen... Take 1 tablet by mouth three times a day  Problem # 2:  ACUTE BRONCHITIS (ICD-466.0) Assessment: Comment Only  The following medications were removed from the medication list:    Tessalon Perles 100 Mg Caps (Benzonatate) .Marland Kitchen... Take 1 capsule by mouth three times a day, as needed[ Her updated medication list for this problem includes:    Penicillin V Potassium 500 Mg Tabs (Penicillin v potassium) .Marland Kitchen... Take 1 tablet by mouth three times a day  Orders: Rocephin  250mg  (N8295) Admin of Therapeutic Inj  intramuscular or subcutaneous (62130) Depo- Medrol 80mg  (J1040)  Problem # 3:  DM (ICD-250.00) Assessment: Deteriorated  The following medications were removed from the medication list:    Lisinopril-hydrochlorothiazide 10-12.5 Mg Tabs (Lisinopril-hydrochlorothiazide) .Marland Kitchen... Take 1 tablet by mouth once a day    Bayer Aspirin  325 Mg Tabs (Aspirin) .Marland Kitchen... Take one tab by mouth once daily Her updated medication list for this problem includes:    Diovan Hct 320-25 Mg Tabs (Valsartan-hydrochlorothiazide) .Marland Kitchen... Take 1 tablet by mouth once a day    Metformin Hcl 500 Mg Tabs (Metformin hcl) .Marland Kitchen... 2 tablets in themorning and one in the evening    Aspir-low  81 Mg Tbec (Aspirin) ..... One tablet daiy  Labs Reviewed: Creat: 0.86 (08/16/2010)    Reviewed HgBA1c results: 7.2 (08/16/2010)  6.4 (04/24/2010)  Problem # 4:  HYPERTENSION (ICD-401.9) Assessment: Deteriorated  The following medications were removed from the medication list:    Lisinopril-hydrochlorothiazide 10-12.5 Mg Tabs (Lisinopril-hydrochlorothiazide) .Marland Kitchen... Take 1 tablet by mouth once a day Her updated medication list for this problem includes:    Diovan Hct 320-25 Mg Tabs (Valsartan-hydrochlorothiazide) .Marland Kitchen... Take 1 tablet by mouth once a day  Prior BP: 120/68 (08/16/2010)  Labs Reviewed: K+: 3.8 (08/16/2010) Creat: : 0.86 (08/16/2010)   Chol: 181 (08/29/2009)   HDL: 55 (08/29/2009)   LDL: 95 (08/29/2009)   TG: 153 (08/29/2009)  Problem # 5:  MULTIPLE  MYELOMA (ICD-203.00) Assessment: Comment Only in remission and on treatment  Complete Medication List: 1)  Klor-con M20 20 Meq Tbcr (Potassium chloride crys cr) .... Take 1 tablet by mouth two times a day 2)  Diovan Hct 320-25 Mg Tabs (Valsartan-hydrochlorothiazide) .... Take 1 tablet by mouth once a day 3)  Centrum Silver Tabs (Multiple vitamins-minerals) .... Take 1 tablet by mouth once a day 4)  Oscal 500/200 D-3 500-200 Mg-unit Tabs (Calcium-vitamin d) .... Take 1 tablet by mouth three times a day 5)  Pantoprazole Sodium 40 Mg Tbec (Pantoprazole sodium) .... Take 1 tablet by mouth once a day 6)  Dexamethasone 4 Mg Tabs (Dexamethasone) .... Uad 7)  Revlimid 10 Mg Caps (Lenalidomide) .... Take for 21 days then 7 days off 8)  Loratadine Allergy Relief 10 Mg Tbdp (Loratadine) .... Take one tab by mouth daily 9)  Alendronate Sodium 70 Mg Tabs (Alendronate sodium) .... One tab by mouth every week 10)  Lumigan 0.03 % Soln (Bimatoprost) .... One drop in each eye at bedtime 11)  Onetouch Ultra Test Strp (Glucose blood) .... Uad 12)  Onetouch Ultrasoft Lancets Misc (Lancets) .... Once daily testing 13)  Premarin 0.625 Mg/gm Crea  (Estrogens, conjugated) .... Insert 1gm vaginally 3 times per week 14)  Metformin Hcl 500 Mg Tabs (Metformin hcl) .... 2 tablets in themorning and one in the evening 15)  Delsyn  .... One teaspoon 3 times daily 16)  Aspir-low 81 Mg Tbec (Aspirin) .... One tablet daiy 17)  Penicillin V Potassium 500 Mg Tabs (Penicillin v potassium) .... Take 1 tablet by mouth three times a day 18)  Valtrex 1 Gm Tabs (Valacyclovir hcl) .... One daily as needed (takes on avg one per month)  Patient Instructions: 1)  F/u as before. 2)  you are being treated for sinusitis and bronchitis. 3)  you will get injections in the office and meds are also being sent in fo this. 4)  pLs stop lisinopril/hctz, your bP is high, resume diovan/hctz 320/25 one daily, we will also notify the pharmacy. 5)  youwill be referred to the foot ctr of your choice. 6)  New dose of asprin is 81mg  one daily, 7)  Clinically you have not broken your thigh, you have strained a musce, use tylenol 1 three times daily for th next 3 days as needed for pain Prescriptions: PENICILLIN V POTASSIUM 500  MG TABS (PENICILLIN V POTASSIUM) Take 1 tablet by mouth three times a day  #30 x 0   Entered and Authorized by:   [name] MD   Signed by:   [name] MD on 10/12/2010   Method used:   Electronically to        [facility] (retail)       [address] [place], [place]  [address]       Ph: [phone]       Fax: [phone]   RxID:   [id number]    Medication Administration  Injection # 1:    Medication: Rocephin  250mg     Diagnosis: ACUTE BRONCHITIS (ICD-466.0)    Route: IM    Site: RUOQ gluteus    Exp Date: 12/2012    Lot #: [id number]    Mfr: [email or web address]    Comments: 500 mg given    Patient tolerated injection without complications    Given by: Mauricia Area CMA (October 12, 2010 2:23 PM)  Injection # 2:    Medication: Depo- Medrol 80mg     Diagnosis: ACUTE BRONCHITIS  (ICD-466.0)    Route: IM    Site: LUOQ gluteus    Exp Date: 06/2011    Lot #: [id number]    Mfr: Pharmacia    Comments: 80 mg given    Patient tolerated injection without complications    Given by: Mauricia Area CMA (October 12, 2010 2:25 PM)  Orders Added: 1)  Est. Patient Level IV [40347] 2)  Rocephin  250mg  [J0696] 3)  Admin of Therapeutic Inj  intramuscular or subcutaneous [96372] 4)  Depo- Medrol 80mg  [J1040]  Appended Document: OV diovan hctz 320/25 samples given lot [id number] exp 10/12 5 packs given

## 2011-01-30 NOTE — Progress Notes (Signed)
Summary: [facility]  [facility]   Imported By: Lind Guest 02/24/2010 09:51:09  _____________________________________________________________________  External Attachment:    Type:   Image     Comment:   External Document

## 2011-01-30 NOTE — Progress Notes (Signed)
Summary: HEMATLOGY/MEDICAL ONCOLOGY  HEMATLOGY/MEDICAL ONCOLOGY   Imported By: Lind Guest 04/20/2010 08:26:39  _____________________________________________________________________  External Attachment:    Type:   Image     Comment:   External Document

## 2011-01-30 NOTE — Assessment & Plan Note (Signed)
Summary: office visit   Vital Signs:  Patient profile:   60 year old female Menstrual status:  hysterectomy Height:      65.5 inches Weight:      229 pounds BMI:     37.66 O2 Sat:      93 % Pulse rate:   108 / minute Pulse rhythm:   regular Resp:     16 per minute BP sitting:   120 / 74  (left arm) Cuff size:   large  Vitals Entered By: Everitt Amber LPN (April 24, 2010 1:11 PM)  Nutrition Counseling: Patient's BMI is greater than 25 and therefore counseled on weight management options. CC: has been coughing since yesterday, nothing coming up.   Primary Care Provider:  Elky Funches  CC:  has been coughing since yesterday and nothing coming up.Marland Kitchen  History of Present Illness: Reports  that  she ahd been doing well until recently when her brother was hospitalised with acute cholecystitis. She ahs also noted a 1 day h/o cough which is non productive, but is associated with increased post nasal drainage.which is clear. Denies recent fever or chills. Denies sinus pressure,  , ear pain or sore throat. Denies chest congestion, or cough productive of sputum. Denies chest pain, palpitations, PND, orthopnea or leg swelling. Denies abdominal pain, nausea, vomitting, diarrhea or constipation. Denies change in bowel movements or bloody stool. Denies dysuria , frequency, incontinence or hesitancy. Denies  joint pain, swelling, or reduced mobility. Denies headaches, vertigo, seizures. Reports mild anxiety, but denies depression o rinsmnia. Denies  rash, lesions, or itch.     Current Medications (verified): 1)  Klor-Con M20 20 Meq  Tbcr (Potassium Chloride Crys Cr) .... Take 1 Tablet By Mouth Two Times A Day 2)  Diovan Hct 320-25 Mg  Tabs (Valsartan-Hydrochlorothiazide) .... Take 1 Tablet By Mouth Once A Day 3)  Lisinopril-Hydrochlorothiazide 10-12.5 Mg  Tabs (Lisinopril-Hydrochlorothiazide) .... Take 1 Tablet By Mouth Once A Day 4)  Centrum Silver   Tabs (Multiple Vitamins-Minerals) .... Take  1 Tablet By Mouth Once A Day 5)  Oscal 500/200 D-3 500-200 Mg-Unit  Tabs (Calcium-Vitamin D) .... Take 1 Tablet By Mouth Three Times A Day 6)  Pantoprazole Sodium 40 Mg  Tbec (Pantoprazole Sodium) .... Take 1 Tablet By Mouth Once A Day 7)  Dexamethasone 4 Mg  Tabs (Dexamethasone) .... Uad 8)  Valtrex 1 Gm  Tabs (Valacyclovir Hcl) .... Take One Half Tab By Mouth Every Other Day 9)  Revlimid 10 Mg Caps (Lenalidomide) .... Take For 21 Days Then 7 Days Off 10)  Loratadine Allergy Relief 10 Mg Tbdp (Loratadine) .... Take One Tab By Mouth Daily 11)  Bayer Aspirin 325 Mg Tabs (Aspirin) .... Take One Tab By Mouth Once Daily 12)  Alendronate Sodium 70 Mg Tabs (Alendronate Sodium) .... One Tab By Mouth Every Week 13)  Metformin Hcl 500 Mg Tabs (Metformin Hcl) .... One Tab By Mouth Bid 14)  Lumigan 0.03 % Soln (Bimatoprost) .... One Drop in Each Eye At Bedtime 15)  Onetouch Ultra Test  Strp (Glucose Blood) .... Uad  Allergies (verified): 1)  ! Codeine  Review of Systems      See HPI General:  Complains of fatigue. Eyes:  Denies discharge and red eye. ENT:  Complains of nasal congestion and postnasal drainage. Endo:  Denies cold intolerance, excessive hunger, excessive thirst, excessive urination, heat intolerance, polyuria, and weight change; tests at least 3 times weekly. Heme:  Denies abnormal bruising and bleeding. Allergy:  Complains of  seasonal allergies; increased symptoms in the past week, not unexpected in the Spring.  Physical Exam  General:  Well-developed,obese, in no acute distress; alert,appropriate and cooperative throughout examination HEENT: No facial asymmetry,  EOMI, No sinus tenderness,right  TM Clear, left occluded,oropharynx  pink and moist. Erythema and edema of nasal mucosa  Chest: Clear to auscultation bilaterally.  CVS: S1, S2, No murmurs, No S3.   Abd: Soft, Nontender.  MS: Adequate ROM spine, hips, shoulders and knees.  Ext: No edema.   CNS: CN 2-12 intact, power  tone and sensation normal throughout.   Skin: Intact, no visible lesions or rashes.  Psych: Good eye contact, normal affect.  Memory intact, not anxious or depressed appearing.   Diabetes Management Exam:    Foot Exam (with socks and/or shoes not present):       Sensory-Monofilament:          Left foot: normal          Right foot: normal       Inspection:          Left foot: normal          Right foot: normal       Nails:          Left foot: normal          Right foot: normal   Impression & Recommendations:  Problem # 1:  DM (ICD-250.00) Assessment Comment Only  Her updated medication list for this problem includes:    Diovan Hct 320-25 Mg Tabs (Valsartan-hydrochlorothiazide) .Marland Kitchen... Take 1 tablet by mouth once a day    Lisinopril-hydrochlorothiazide 10-12.5 Mg Tabs (Lisinopril-hydrochlorothiazide) .Marland Kitchen... Take 1 tablet by mouth once a day    Bayer Aspirin 325 Mg Tabs (Aspirin) .Marland Kitchen... Take one tab by mouth once daily    Metformin Hcl 500 Mg Tabs (Metformin hcl) ..... One tab by mouth bid  Orders: T- Hemoglobin A1C ([address]-60454)  Labs Reviewed: Creat: 0.76 (03/16/2010)    Reviewed HgBA1c results: 6.2 (11/17/2009)  5.9 (08/03/2009)  Problem # 2:  HYPERTENSION (ICD-401.9) Assessment: Unchanged  Her updated medication list for this problem includes:    Diovan Hct 320-25 Mg Tabs (Valsartan-hydrochlorothiazide) .Marland Kitchen... Take 1 tablet by mouth once a day    Lisinopril-hydrochlorothiazide 10-12.5 Mg Tabs (Lisinopril-hydrochlorothiazide) .Marland Kitchen... Take 1 tablet by mouth once a day  Orders: T-BUN ([id number]) T-Creatinine Blood (29562-13086)  BP today: 120/74 Prior BP: 120/76 (03/01/2010)  Labs Reviewed: K+: 3.5 (04/20/2010) Creat: : 0.76 (03/16/2010)   Chol: 181 (08/29/2009)   HDL: 55 (08/29/2009)   LDL: 95 (08/29/2009)   TG: 153 (08/29/2009)  Problem # 3:  MULTIPLE  MYELOMA (ICD-203.00) Assessment: Comment Only continues cllose f/u with oncology.  Problem # 4:  OBESITY  (ICD-278.00) Assessment: Improved  Ht: 65.5 (04/24/2010)   Wt: 229 (04/24/2010)   BMI: 37.66 (04/24/2010)  Problem # 5:  ALLERGIC RHINITIS (ICD-477.9) Assessment: Deteriorated  Her updated medication list for this problem includes:    Loratadine Allergy Relief 10 Mg Tbdp (Loratadine) .Marland Kitchen... Take one tab by mouth daily, encouraged nasal washes regularly with aural toilet  Problem # 6:  CERUMEN IMPACTION, LEFT (ICD-380.4) Assessment: Comment Only  Orders: Cerumen Impaction Removal (57846), succesful removal with no trauma to eardrum  Complete Medication List: 1)  Klor-con M20 20 Meq Tbcr (Potassium chloride crys cr) .... Take 1 tablet by mouth two times a day 2)  Diovan Hct 320-25 Mg Tabs (Valsartan-hydrochlorothiazide) .... Take 1 tablet by mouth once a day 3)  Lisinopril-hydrochlorothiazide 10-12.5 Mg  Tabs (Lisinopril-hydrochlorothiazide) .... Take 1 tablet by mouth once a day 4)  Centrum Silver Tabs (Multiple vitamins-minerals) .... Take 1 tablet by mouth once a day 5)  Oscal 500/200 D-3 500-200 Mg-unit Tabs (Calcium-vitamin d) .... Take 1 tablet by mouth three times a day 6)  Pantoprazole Sodium 40 Mg Tbec (Pantoprazole sodium) .... Take 1 tablet by mouth once a day 7)  Dexamethasone 4 Mg Tabs (Dexamethasone) .... Uad 8)  Valtrex 1 Gm Tabs (Valacyclovir hcl) .... Take one half tab by mouth every other day 9)  Revlimid 10 Mg Caps (Lenalidomide) .... Take for 21 days then 7 days off 10)  Loratadine Allergy Relief 10 Mg Tbdp (Loratadine) .... Take one tab by mouth daily 11)  Bayer Aspirin 325 Mg Tabs (Aspirin) .... Take one tab by mouth once daily 12)  Alendronate Sodium 70 Mg Tabs (Alendronate sodium) .... One tab by mouth every week 13)  Metformin Hcl 500 Mg Tabs (Metformin hcl) .... One tab by mouth bid 14)  Lumigan 0.03 % Soln (Bimatoprost) .... One drop in each eye at bedtime 15)  Onetouch Ultra Test Strp (Glucose blood) .... Uad 16)  Tessalon Perles 100 Mg Caps (Benzonatate) ....  Take 1 capsule by mouth three times a day, as needed[  Patient Instructions: 1)  Please schedule a follow-up appointment in 3.5 months. 2)  It is important that you exercise regularly at least 20 minutes 5 times a week. If you develop chest pain, have severe difficulty breathing, or feel very tired , stop exercising immediately and seek medical attention. 3)  You need to lose weight. Consider a lower calorie diet and regular exercise.  4)  HbgA1C prior to visit, ICD-9: and creatinine todauy and BUN 5)  Pls do slalt water flushes  in the nostril 3 times daily  Prescriptions: METFORMIN HCL 500 MG TABS (METFORMIN HCL) one tab by mouth bid  #60 Each x 4   Entered by:   Everitt Amber LPN   Authorized by:   [name] MD   Signed by:   Everitt Amber LPN on 62/13/0865   Method used:   Electronically to        [facility] (retail)       [address] [place], [place]  [address]       Ph: [phone]       Fax: [phone]   RxID:   [id number] TESSALON PERLES 100 MG CAPS (BENZONATATE) Take 1 capsule by mouth three times a day, as needed[  #42 x 0   Entered and Authorized by:   [name] MD   Signed by:   [name] MD on 04/24/2010   Method used:   Electronically to        [facility] (retail)       [address]       [place], [place]  [address]       Ph: [phone]       Fax: [phone]   RxID:   [id number]

## 2011-01-30 NOTE — Progress Notes (Signed)
[facility]   Imported By: Lind Guest 03/24/2010 13:31:16  _____________________________________________________________________  External Attachment:    Type:   Image     Comment:   External Document

## 2011-01-30 NOTE — Progress Notes (Signed)
[facility]   Imported By: Lind Guest 06/20/2010 11:34:40  _____________________________________________________________________  External Attachment:    Type:   Image     Comment:   External Document

## 2011-01-30 NOTE — Assessment & Plan Note (Signed)
Summary: SICK, FEVER Room-1   Vital Signs:  Patient profile:   60 year old female Menstrual status:  hysterectomy Height:      65.5 inches Weight:      233.25 pounds BMI:     38.36 O2 Sat:      95 % Temp:     99.2 degrees F oral Pulse rate:   84 / minute Resp:     16 per minute BP sitting:   120 / 76  (left arm) Cuff size:   large  Vitals Entered By: Everitt Amber LPN (March 02, 2955 1:12 PM) CC: coughing alot and head has been hurting, no phlegm production, nasal drainage is clear, feeling bad   Primary Provider:  simpson  CC:  coughing alot and head has been hurting, no phlegm production, nasal drainage is clear, and feeling bad.  History of Present Illness: Pt here today with c/o nasal congestion, sinus pressure, chest congestion & nonprod cough x 2-3 days.  She has been tired & slept most of the day yesterday.  Cough is not disrupting sleep.  She is taking Mucinex.  Has noticed fever or chills at home.  Pt states she is otherwise doing well,  Doesn't need any prescription refils today.  Current Medications (verified): 1)  Klor-Con M20 20 Meq  Tbcr (Potassium Chloride Crys Cr) .... Take Two Tablets By Mouth Once Daily 2)  Diovan Hct 320-25 Mg  Tabs (Valsartan-Hydrochlorothiazide) .... Take 1 Tablet By Mouth Once A Day 3)  Lisinopril-Hydrochlorothiazide 10-12.5 Mg  Tabs (Lisinopril-Hydrochlorothiazide) .... Take 1 Tablet By Mouth Once A Day 4)  Centrum Silver   Tabs (Multiple Vitamins-Minerals) .... Take 1 Tablet By Mouth Once A Day 5)  Oscal 500/200 D-3 500-200 Mg-Unit  Tabs (Calcium-Vitamin D) .... Take 1 Tablet By Mouth Three Times A Day 6)  Pantoprazole Sodium 40 Mg  Tbec (Pantoprazole Sodium) .... Take 1 Tablet By Mouth Once A Day 7)  Dexamethasone 4 Mg  Tabs (Dexamethasone) .... Uad 8)  Valtrex 1 Gm  Tabs (Valacyclovir Hcl) .... Take One Half Tab By Mouth Every Other Day 9)  Revlimid 25 Mg Caps (Lenalidomide) .... Take One Cap By Mouth Once Daily For 21 Days Then 7 Days  Off 10)  Loratadine Allergy Relief 10 Mg Tbdp (Loratadine) .... Take One Tab By Mouth Daily 11)  Bayer Aspirin 325 Mg Tabs (Aspirin) .... Take One Tab By Mouth Once Daily 12)  Alendronate Sodium 70 Mg Tabs (Alendronate Sodium) .... One Tab By Mouth Every Week 13)  Metformin Hcl 500 Mg Tabs (Metformin Hcl) .... One Tab By Mouth Bid 14)  Lumigan 0.03 % Soln (Bimatoprost) .... One Drop in Each Eye At Bedtime 15)  Onetouch Ultra Test  Strp (Glucose Blood) .... Uad  Allergies (verified): 1)  ! Codeine  Past History:  Past medical history reviewed for relevance to current acute and chronic problems.  Past Medical History: Reviewed history from 05/03/2008 and no changes required. 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 ENT:  Complains of nasal congestion, postnasal drainage, and sinus pressure; denies earache and sore throat. CV:  Denies chest pain or discomfort. Resp:  Complains of cough; denies shortness of breath, sputum productive, and wheezing. Heme:  Denies enlarge lymph nodes and fevers.  Physical Exam  General:  Well-developed,well-nourished,in no acute distress; alert,appropriate and cooperative throughout examination Head:  Normocephalic and atraumatic without obvious abnormalities.  No apparent alopecia or balding. Ears:  External ear exam shows no significant lesions or deformities.  Otoscopic examination reveals clear canals, tympanic membranes are intact bilaterally without bulging, retraction, inflammation or discharge. Hearing is grossly normal bilaterally. Nose:  External nasal examination shows no deformity or inflammation. Nasal mucosa are pink and moist without lesions or exudates.no sinus percussion tenderness.   Mouth:  Oral mucosa and oropharynx without lesions or exudates.  Teeth in good repair. Neck:  No deformities, masses, or  tenderness noted. Lungs:  Normal respiratory effort, chest expands symmetrically. Lungs are clear to auscultation, no crackles or wheezes. Heart:  Normal rate and regular rhythm. S1 and S2 normal without gallop, murmur, click, rub or other extra sounds. Cervical Nodes:  No lymphadenopathy noted Psych:  Cognition and judgment appear intact. Alert and cooperative with normal attention span and concentration. No apparent delusions, illusions, hallucinations   Impression & Recommendations:  Problem # 1:  ACUTE SINUSITIS, UNSPECIFIED (ICD-461.9) Assessment New  Her updated medication list for this problem includes:    Sulfamethoxazole-tmp Ds 800-160 Mg Tabs (Sulfamethoxazole-trimethoprim) .Marland Kitchen... 1 bid  Problem # 2:  ACUTE BRONCHITIS (ICD-466.0) Assessment: New  Her updated medication list for this problem includes:    Sulfamethoxazole-tmp Ds 800-160 Mg Tabs (Sulfamethoxazole-trimethoprim) .Marland Kitchen... 1 bid  Problem # 3:  HYPERTENSION (ICD-401.9) Assessment: Unchanged  Her updated medication list for this problem includes:    Diovan Hct 320-25 Mg Tabs (Valsartan-hydrochlorothiazide) .Marland Kitchen... Take 1 tablet by mouth once a day    Lisinopril-hydrochlorothiazide 10-12.5 Mg Tabs (Lisinopril-hydrochlorothiazide) .Marland Kitchen... Take 1 tablet by mouth once a day  BP today: 120/76 Prior BP: 118/76 (11/17/2009)  Labs Reviewed: K+: 3.7 (08/29/2009) Creat: : 0.89 (08/29/2009)   Chol: 181 (08/29/2009)   HDL: 55 (08/29/2009)   LDL: 95 (08/29/2009)   TG: 153 (08/29/2009)  Complete Medication List: 1)  Klor-con M20 20 Meq Tbcr (Potassium chloride crys cr) .... Take two tablets by mouth once daily 2)  Diovan Hct 320-25 Mg Tabs (Valsartan-hydrochlorothiazide) .... Take 1 tablet by mouth once a day 3)  Lisinopril-hydrochlorothiazide 10-12.5 Mg Tabs (Lisinopril-hydrochlorothiazide) .... Take 1 tablet by mouth once a day 4)  Centrum Silver Tabs (Multiple vitamins-minerals) .... Take 1 tablet by mouth once a day 5)  Oscal  500/200 D-3 500-200 Mg-unit Tabs (Calcium-vitamin d) .... Take 1 tablet by mouth three times a day 6)  Pantoprazole Sodium 40 Mg Tbec (Pantoprazole sodium) .... Take 1 tablet by mouth once a day 7)  Dexamethasone 4 Mg Tabs (Dexamethasone) .... Uad 8)  Valtrex 1 Gm Tabs (Valacyclovir hcl) .... Take one half tab by mouth every other day 9)  Revlimid 25 Mg Caps (Lenalidomide) .... Take one cap by mouth once daily for 21 days then 7 days off 10)  Loratadine Allergy Relief 10 Mg Tbdp (Loratadine) .... Take one tab by mouth daily 11)  Bayer Aspirin 325 Mg Tabs (Aspirin) .... Take one tab by mouth once daily 12)  Alendronate Sodium 70 Mg Tabs (Alendronate sodium) .... One tab by mouth every week 13)  Metformin Hcl 500 Mg Tabs (Metformin hcl) .... One tab by mouth bid 14)  Lumigan 0.03 % Soln (Bimatoprost) .... One drop in each eye at bedtime 15)  Onetouch Ultra Test Strp (Glucose blood) .... Uad 16)  Sulfamethoxazole-tmp Ds 800-160 Mg Tabs (Sulfamethoxazole-trimethoprim) .Marland Kitchen.. 1 bid  Patient Instructions: 1)  Please schedule a follow-up appointment as needed. 2)  Get plenty of rest, drink lots of clear liquids, and use Tylenol or Ibuprofen for fever and  comfort. Return in 7-10 days if you're not better:sooner if you're feeling worse. 3)  I prescribed an antibiotic for you. 4)  Continue Mucinex as needed.  Prescriptions: SULFAMETHOXAZOLE-TMP DS 800-160 MG TABS (SULFAMETHOXAZOLE-TRIMETHOPRIM) 1 bid  #20 x 0   Entered and Authorized by:   Esperanza Sheets PA   Signed by:   Esperanza Sheets PA on 03/01/2010   Method used:   Electronically to        [facility] (retail)       [address]       [place], [place]  [address]       Ph: [phone]       Fax: [phone]   RxID:   [id number]

## 2011-01-30 NOTE — Consult Note (Signed)
Summary: Consultation Report  Consultation Report   Imported By: Lind Guest 10/27/2010 13:37:52  _____________________________________________________________________  External Attachment:    Type:   Image     Comment:   External Document

## 2011-01-30 NOTE — Progress Notes (Signed)
  Phone Note Other Incoming   Caller: dr simpson Summary of Call: pls call pt let her know the labs from the cancer center show that her liver test is abn, we will need an US of the hiver, and will add additional tests to ensure she does not have infection in her lever causintg the prob.  pls add hepatitis panel the lab sheet is at your area. I anm sending a specific rewquest to scheduling Initial call taken by: [name] MD,  May 15, 2010 11:50 AM  Follow-up for Phone Call        pls add hepatitis panel to the lab sent in from the cancer center drawn on 5/13 Follow-up by: [name] MD,  May 16, 2010 5:24 PM  Additional Follow-up for Phone Call Additional follow up Details #1::        hepatitis acute added Additional Follow-up by: Adella Hare LPN,  May 16, 2010 5:33 PM    Additional Follow-up for Phone Call Additional follow up Details #2::    thanks Follow-up by: [name] MD,  May 16, 2010 10:10 PM

## 2011-01-30 NOTE — Op Note (Signed)
Summary: Operative Report  Operative Report   Imported By: Lind Guest 02/16/2010 14:25:47  _____________________________________________________________________  External Attachment:    Type:   Image     Comment:   External Document

## 2011-01-30 NOTE — Progress Notes (Signed)
Summary: PROCEDURE  Phone Note Call from Patient   Summary of Call: DO YOU STILL WANT HER TO DO THE [facility] FOR MONDAY  CALL BACK AT [phone]  OR HOME #  [phone] Initial call taken by: Lind Guest,  May 16, 2010 4:43 PM  Follow-up for Phone Call        advise pt to proceed with ruq [place] Follow-up by: [name] MD,  May 16, 2010 4:58 PM

## 2011-01-30 NOTE — Progress Notes (Signed)
Summary: hematology / oncology  hematology / oncology   Imported By: Lind Guest 06/07/2010 09:15:32  _____________________________________________________________________  External Attachment:    Type:   Image     Comment:   External Document

## 2011-01-30 NOTE — Progress Notes (Signed)
Summary: [facility]  [facility]   Imported By: Lind Guest 02/07/2010 11:25:52  _____________________________________________________________________  External Attachment:    Type:   Image     Comment:   External Document

## 2011-01-30 NOTE — Letter (Signed)
Summary: pharmacy  pharmacy   Imported By: Lind Guest 10/13/2010 10:56:34  _____________________________________________________________________  External Attachment:    Type:   Image     Comment:   External Document

## 2011-01-30 NOTE — Letter (Signed)
Summary: ABNORMAL PAP  ABNORMAL PAP   Imported By: Lind Guest 02/27/2010 11:06:02  _____________________________________________________________________  External Attachment:    Type:   Image     Comment:   External Document

## 2011-01-30 NOTE — Consult Note (Signed)
Summary: MULTIFOCAL GENITAL DYSPLASIA  MULTIFOCAL GENITAL DYSPLASIA   Imported By: Lind Guest 09/14/2010 08:50:16  _____________________________________________________________________  External Attachment:    Type:   Image     Comment:   External Document

## 2011-01-30 NOTE — Progress Notes (Signed)
Summary: referral  Phone Note Other Incoming   Caller: dr simpson Summary of Call: pls refer for ruq ultrasound, abn liver tests, multiple myeloma Initial call taken by: [name] MD,  May 15, 2010 11:51 AM  Follow-up for Phone Call        pt has appt at [facility] for 05/22/2010 8:00. pt notified  Follow-up by: Rudene Anda,  May 15, 2010 2:21 PM

## 2011-01-30 NOTE — Assessment & Plan Note (Signed)
Summary: F UP   Vital Signs:  Patient profile:   60 year old female Menstrual status:  hysterectomy Height:      65.5 inches Weight:      228.4 pounds BMI:     37.56 O2 Sat:      96 % Pulse rate:   108 / minute Pulse rhythm:   regular Resp:     16 per minute BP sitting:   120 / 68  (left arm) Cuff size:   large  Vitals Entered By: Everitt Amber LPN (August 16, 2010 1:21 PM)  Nutrition Counseling: Patient's BMI is greater than 25 and therefore counseled on weight management options. CC: Follow up chronic problems   Primary Care Darlene Howard:  simpson  CC:  Follow up chronic problems.  History of Present Illness: Reports  that she has been  doing fairly  well. Denies recent fever or chills. Denies sinus pressure, nasal congestion , ear pain or sore throat. Denies chest congestion, or cough productive of sputum. Denies chest pain, palpitations, PND, orthopnea or leg swelling. Denies abdominal pain, nausea, vomitting, diarrhea or constipation. Denies change in bowel movements or bloody stool. Denies dysuria , frequency, incontinence or hesitancy. Denies  joint pain, swelling, or reduced mobility. Denies headaches, vertigo, seizures. Denies depression, anxiety or insomnia. Denies  rash, lesions, or itch.     Current Medications (verified): 1)  Klor-Con M20 20 Meq  Tbcr (Potassium Chloride Crys Cr) .... Take 1 Tablet By Mouth Two Times A Day 2)  Diovan Hct 320-25 Mg  Tabs (Valsartan-Hydrochlorothiazide) .... Take 1 Tablet By Mouth Once A Day 3)  Lisinopril-Hydrochlorothiazide 10-12.5 Mg  Tabs (Lisinopril-Hydrochlorothiazide) .... Take 1 Tablet By Mouth Once A Day 4)  Centrum Silver   Tabs (Multiple Vitamins-Minerals) .... Take 1 Tablet By Mouth Once A Day 5)  Oscal 500/200 D-3 500-200 Mg-Unit  Tabs (Calcium-Vitamin D) .... Take 1 Tablet By Mouth Three Times A Day 6)  Pantoprazole Sodium 40 Mg  Tbec (Pantoprazole Sodium) .... Take 1 Tablet By Mouth Once A Day 7)  Dexamethasone 4  Mg  Tabs (Dexamethasone) .... Uad 8)  Valtrex 1 Gm  Tabs (Valacyclovir Hcl) .... Take One Half Tab By Mouth Every Other Day 9)  Revlimid 10 Mg Caps (Lenalidomide) .... Take For 21 Days Then 7 Days Off 10)  Loratadine Allergy Relief 10 Mg Tbdp (Loratadine) .... Take One Tab By Mouth Daily 11)  Bayer Aspirin 325 Mg Tabs (Aspirin) .... Take One Tab By Mouth Once Daily 12)  Alendronate Sodium 70 Mg Tabs (Alendronate Sodium) .... One Tab By Mouth Every Week 13)  Metformin Hcl 500 Mg Tabs (Metformin Hcl) .... One Tab By Mouth Bid 14)  Lumigan 0.03 % Soln (Bimatoprost) .... One Drop in Each Eye At Bedtime 15)  Onetouch Ultra Test  Strp (Glucose Blood) .... Uad 16)  Tessalon Perles 100 Mg Caps (Benzonatate) .... Take 1 Capsule By Mouth Three Times A Day, As Needed[ 17)  Onetouch Ultrasoft Lancets  Misc (Lancets) .... Once Daily Testing 18)  Premarin 0.625 Mg/gm Crea (Estrogens, Conjugated) .... Insert 1gm Vaginally 3 Times Per Week  Allergies (verified): 1)  ! Codeine  Past History:  Past medical history reviewed for relevance to current acute and chronic problems.  Past Medical History: Reviewed history from 05/03/2008 and no changes required. 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 MS:  Complains of joint pain and stiffness. Endo:  Denies excessive thirst and excessive urination. Heme:  Denies abnormal bruising and bleeding. Allergy:  Denies hives or rash and itching eyes.  Physical Exam  General:  Well-developed,obese, in no acute distress; alert,appropriate and cooperative throughout examination HEENT: No facial asymmetry,  EOMI, No sinus tenderness,right  TM Clear, left occluded,oropharynx  pink and moist. Erythema  and edema of nasal mucosa  Chest: Clear to auscultation bilaterally.  CVS: S1, S2, No murmurs, No S3.   Abd: Soft, Nontender.  MS: Adequate ROM spine, hips, shoulders and knees.  Ext: No edema.   CNS: CN 2-12 intact, power tone and sensation normal throughout.   Skin: Intact, no visible lesions or rashes.  Psych: Good eye contact, normal affect.  Memory intact, not anxious or depressed appearing.   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 One tab by mouth every week  Orders: T-Vitamin D (25-Hydroxy) [phone])  Problem # 2:  ALLERGIC RHINITIS (ICD-477.9) Assessment: Improved  Her updated medication list for this problem includes:    Loratadine Allergy Relief 10 Mg Tbdp (Loratadine) .Marland Kitchen... Take one tab by mouth daily  Problem # 3:  DM (ICD-250.00) Assessment: Deteriorated  The following medications were removed from the medication list:    Metformin Hcl 500 Mg Tabs (Metformin hcl) ..... One tab by mouth bid Her updated medication list for this problem includes:    Diovan Hct 320-25 Mg Tabs (Valsartan-hydrochlorothiazide) .Marland Kitchen... Take 1 tablet by mouth once a day    Lisinopril-hydrochlorothiazide 10-12.5 Mg Tabs (Lisinopril-hydrochlorothiazide) .Marland Kitchen... Take 1 tablet by mouth once a day    Bayer Aspirin 325 Mg Tabs (Aspirin) .Marland Kitchen... Take one tab by mouth once daily    Metformin Hcl 500 Mg Tabs (Metformin hcl) .Marland Kitchen... 2 tablets in themorning and one in the evening  Orders: T-  Hemoglobin A1C ([id number])  Problem # 4:  HYPERTENSION (ICD-401.9) Assessment: Unchanged  Her updated medication list for this problem includes:    Diovan Hct 320-25 Mg Tabs (Valsartan-hydrochlorothiazide) .Marland Kitchen... Take 1 tablet by mouth once a day    Lisinopril-hydrochlorothiazide 10-12.5 Mg Tabs (Lisinopril-hydrochlorothiazide) .Marland Kitchen... Take 1 tablet by mouth once a day  Orders: T-Basic Metabolic Panel [phone])  BP today: 120/68 Prior BP: 120/74 (04/24/2010)  Labs Reviewed: K+: 3.5 (04/20/2010) Creat: : 0.91 (04/24/2010)   Chol: 181 (08/29/2009)   HDL: 55 (08/29/2009)   LDL: 95 (08/29/2009)   TG: 153 (08/29/2009)  Problem # 5:  OBESITY (ICD-278.00) Assessment: Deteriorated  Ht: 65.5 (08/16/2010)   Wt: 228.4 (08/16/2010)   BMI: 37.56 (08/16/2010)  Complete Medication List: 1)  Klor-con  M20 20 Meq Tbcr (Potassium chloride crys cr) .... Take 1 tablet by mouth two times a day 2)  Diovan Hct 320-25 Mg Tabs (Valsartan-hydrochlorothiazide) .... Take 1 tablet by mouth once a day 3)  Lisinopril-hydrochlorothiazide 10-12.5 Mg Tabs (Lisinopril-hydrochlorothiazide) .... Take 1 tablet by mouth once a day 4)  Centrum Silver Tabs (Multiple vitamins-minerals) .... Take 1 tablet by mouth once a day 5)  Oscal 500/200 D-3 500-200 Mg-unit Tabs (Calcium-vitamin d) .... Take 1 tablet by mouth three times a day 6)  Pantoprazole Sodium 40 Mg Tbec (Pantoprazole sodium) .... Take 1 tablet by mouth once a day 7)  Dexamethasone 4 Mg Tabs (Dexamethasone) .... Uad 8)  Valtrex 1 Gm Tabs (Valacyclovir hcl) .... Take one half tab by mouth every other day 9)  Revlimid 10 Mg Caps (Lenalidomide) .... Take for 21 days then 7 days off 10)  Loratadine Allergy Relief 10 Mg Tbdp (Loratadine) .... Take one tab by mouth daily 11)  Bayer Aspirin 325 Mg Tabs (Aspirin) .... Take one tab by mouth once daily 12)  Alendronate Sodium 70 Mg Tabs (Alendronate sodium) .... One tab by mouth every week 13)  Lumigan 0.03 % Soln  (Bimatoprost) .... One drop in each eye at bedtime 14)  Onetouch Ultra Test Strp (Glucose blood) .... Uad 15)  Tessalon Perles 100 Mg Caps (Benzonatate) .... Take 1 capsule by mouth three times a day, as needed[ 16)  Onetouch Ultrasoft Lancets Misc (Lancets) .... Once daily testing 17)  Premarin 0.625 Mg/gm Crea (Estrogens, conjugated) .... Insert 1gm vaginally 3 times per week 18)  Metformin Hcl 500 Mg Tabs (Metformin hcl) .... 2 tablets in themorning and one in the evening  Patient Instructions: 1)  Please schedule a follow-up appointment in 3.5  months. 2)  It is important that you exercise regularly at least 20 minutes 5 times a week. If you develop chest pain, have severe difficulty breathing, or feel very tired , stop exercising immediately and seek medical attention. 3)  You need to lose weight. Consider a lower calorie diet and regular exercise.  4)  HbgA1C prior to visit, ICD-9: 5)  BMP prior to visit, ICD-9:    6)  vitamin D Prescriptions: METFORMIN HCL 500 MG TABS (METFORMIN HCL) 2 tablets in themorning and one in the evening  #90 x 3   Entered and Authorized by:   [name] MD   Signed by:   [name] MD on 08/20/2010   Method used:   Historical   RxID:   [id number] METFORMIN HCL 500 MG TABS (METFORMIN HCL) one tab by mouth bid  #60 Each x 4   Entered by:   Everitt Amber LPN   Authorized by:   [name] MD   Signed by:   Everitt Amber LPN on 46/96/2952   Method used:   Electronically to        [facility] (retail)       [address]       [place], [place]  [address]       Ph: [phone]       Fax: [phone]   RxID:   [id number] LORATADINE ALLERGY RELIEF 10 MG TBDP (LORATADINE) take one tab by mouth daily  #30 Each x 4   Entered by:   Everitt Amber LPN   Authorized by:   [name] MD   Signed by:   Everitt Amber LPN on 88/41/6606   Method used:  Electronically to        [facility]  (retail)       [address]       [place], [place]  [address]       Ph: [phone]       Fax: [phone]   RxID:   [id number] PANTOPRAZOLE SODIUM 40 MG  TBEC (PANTOPRAZOLE SODIUM) Take 1 tablet by mouth once a day  #30 Each x 4   Entered by:   Everitt Amber LPN   Authorized by:   [name] MD   Signed by:   Everitt Amber LPN on 84/13/2440   Method used:   Electronically to        [facility] (retail)       [address] [place], [place]  [address]       Ph: [phone]       Fax: [phone]   RxID:   [id number] OSCAL 500/200 D-3 500-200 MG-UNIT  TABS (CALCIUM-VITAMIN D) Take 1 tablet by mouth three times a day  #90 x 4   Entered by:   Everitt Amber LPN   Authorized by:   [name] MD   Signed by:   Everitt Amber LPN on 30/16/0109   Method used:   Electronically to        [facility] (retail)       [address] [place], [place]  [address]       Ph: [phone]       Fax: [phone]   RxID:   [id number] LISINOPRIL-HYDROCHLOROTHIAZIDE 10-12.5 MG  TABS (LISINOPRIL-HYDROCHLOROTHIAZIDE) Take 1 tablet by mouth once a day  #30 Each x 4   Entered by:   Everitt Amber LPN   Authorized by:   [name] MD   Signed by:   Everitt Amber LPN on 48/54/6270   Method used:   Electronically to        [facility] (retail)       [address] [place], [place]  [address]       Ph: [phone]       Fax: [phone]   RxID:   [id number] DIOVAN HCT 320-25 MG  TABS (VALSARTAN-HYDROCHLOROTHIAZIDE) Take 1 tablet by mouth once a day  #90 Each x 4   Entered by:   Everitt Amber LPN   Authorized by:   [name] MD   Signed by:   Everitt Amber LPN on 35/36/1443   Method used:   Electronically to        [facility] (retail)       [address] [place], [place]   [address]       Ph: [phone]       Fax: [phone]   RxID:   [id number]

## 2011-01-30 NOTE — Progress Notes (Signed)
Summary: hematology/ oncology  hematology/ oncology   Imported By: Lind Guest 06/07/2010 09:13:53  _____________________________________________________________________  External Attachment:    Type:   Image     Comment:   External Document

## 2011-01-30 NOTE — Progress Notes (Signed)
Summary: HEMATOLOGY/ MEDICAL ONCOLOGY  HEMATOLOGY/ MEDICAL ONCOLOGY   Imported By: Lind Guest 07/18/2010 14:16:25  _____________________________________________________________________  External Attachment:    Type:   Image     Comment:   External Document

## 2011-01-30 NOTE — Consult Note (Signed)
Summary: telephone note / wendy brewster,md  telephone note / wendy brewster,md   Imported By: Lind Guest 10/04/2010 13:01:29  _____________________________________________________________________  External Attachment:    Type:   Image     Comment:   External Document

## 2011-01-30 NOTE — Progress Notes (Signed)
Summary: [facility]  [facility]   Imported By: Lind Guest 01/13/2010 10:54:45  _____________________________________________________________________  External Attachment:    Type:   Image     Comment:   External Document

## 2011-01-30 NOTE — Consult Note (Signed)
Summary: Consultation Report  Consultation Report   Imported By: Lind Guest 01/02/2010 09:44:26  _____________________________________________________________________  External Attachment:    Type:   Image     Comment:   External Document

## 2011-01-30 NOTE — Miscellaneous (Signed)
Summary: Orders Update  Clinical Lists Changes  Orders: Added new Test order of T-Basic Metabolic Panel ([id number]) - Signed  

## 2011-01-30 NOTE — Progress Notes (Signed)
  Phone Note From Pharmacy   Caller: [facility]* Summary of Call: pharmacy wants to know if you are aware patient is on an ace and an arb? Initial call taken by: Adella Hare LPN,  April 17, 2010 2:17 PM  Follow-up for Phone Call        yes, let them know Follow-up by: [name] MD,  April 17, 2010 5:25 PM  Additional Follow-up for Phone Call Additional follow up Details #1::        Hunter at apothe[facility]ary aware Additional Follow-up by: Adella Hare LPN,  April 18, 2010 9:36 AM

## 2011-01-30 NOTE — Letter (Signed)
Summary: MED REVIEW SIGNED SHEET  MED REVIEW SIGNED SHEET   Imported By: Lind Guest 10/13/2010 09:39:58  _____________________________________________________________________  External Attachment:    Type:   Image     Comment:   External Document

## 2011-01-30 NOTE — Progress Notes (Signed)
[facility]   Imported By: Lind Guest 08/21/2010 13:53:29  _____________________________________________________________________  External Attachment:    Type:   Image     Comment:   External Document

## 2011-01-31 ENCOUNTER — Encounter: Payer: Self-pay | Admitting: Family Medicine

## 2011-02-01 NOTE — Progress Notes (Signed)
Summary: twisted foot/ ankle  Phone Note Call from Patient   Summary of Call: called wanted to see dr and told her she was not in this week she fell last tuesday and twisted her ankle and wanted dr to look at it but she will wait and see how it does  Initial call taken by: Lind Guest,  January 02, 2011 11:29 AM  Follow-up for Phone Call        noted Follow-up by: Adella Hare LPN,  January 02, 2011 3:22 PM

## 2011-02-01 NOTE — Letter (Signed)
Summary: [facility]   Imported By: Lind Guest 12/28/2010 10:11:11  _____________________________________________________________________  External Attachment:    Type:   Image     Comment:   External Document

## 2011-02-01 NOTE — Assessment & Plan Note (Signed)
Summary: office visit   Vital Signs:  Patient profile:   60 year old female Menstrual status:  hysterectomy Height:      65.5 inches Weight:      230.25 pounds BMI:     37.87 O2 Sat:      97 % on Room air Pulse rate:   98 / minute Pulse rhythm:   regular Resp:     16 per minute BP sitting:   140 / 80  (left arm)  Vitals Entered By: Adella Hare LPN (December 06, 2010 1:30 PM)  Nutrition Counseling: Patient's BMI is greater than 25 and therefore counseled on weight management options.  O2 Flow:  Room air CC: follow-up visit Is Patient Diabetic? Yes Did you bring your meter with you today? No Pain Assessment Patient in pain? no        Primary Care Provider:  simpson  CC:  follow-up visit.  History of Present Illness: Reports  that she has been  doing well. States she has not been as diligent with diet and exercie as desired, but intends to change this. Denies recent fever or chills. Denies sinus pressure, nasal congestion , ear pain or sore throat. Denies chest congestion, or cough productive of sputum. Denies chest pain, palpitations, PND, orthopnea or leg swelling. Denies abdominal pain, nausea, vomitting, diarrhea or constipation. Denies change in bowel movements or bloody stool. Denies dysuria , frequency, incontinence or hesitancy. Denies  joint pain, swelling, or reduced mobility. Denies headaches, vertigo, seizures. Denies depression, anxiety or insomnia. Denies  rash, lesions, or itch.     Current Medications (verified): 1)  Klor-Con M20 20 Meq  Tbcr (Potassium Chloride Crys Cr) .... Take 1 Tablet By Mouth Two Times A Day 2)  Diovan Hct 320-25 Mg  Tabs (Valsartan-Hydrochlorothiazide) .... Take 1 Tablet By Mouth Once A Day 3)  Centrum Silver   Tabs (Multiple Vitamins-Minerals) .... Take 1 Tablet By Mouth Once A Day 4)  Oscal 500/200 D-3 500-200 Mg-Unit  Tabs (Calcium-Vitamin D) .... Take 1 Tablet By Mouth Three Times A Day 5)  Pantoprazole Sodium 40 Mg   Tbec (Pantoprazole Sodium) .... Take 1 Tablet By Mouth Once A Day 6)  Dexamethasone 4 Mg  Tabs (Dexamethasone) .... Uad 7)  Revlimid 10 Mg Caps (Lenalidomide) .... Take For 21 Days Then 7 Days Off 8)  Loratadine Allergy Relief 10 Mg Tbdp (Loratadine) .... Take One Tab By Mouth Daily 9)  Alendronate Sodium 70 Mg Tabs (Alendronate Sodium) .... One Tab By Mouth Every Week 10)  Lumigan 0.03 % Soln (Bimatoprost) .... One Drop in Each Eye At Bedtime 11)  Onetouch Ultra Test  Strp (Glucose Blood) .... Uad 12)  Onetouch Ultrasoft Lancets  Misc (Lancets) .... Once Daily Testing 13)  Premarin 0.625 Mg/gm Crea (Estrogens, Conjugated) .... Insert 1gm Vaginally 3 Times Per Week 14)  Metformin Hcl 500 Mg Tabs (Metformin Hcl) .... 2 Tablets in Themorning and One in The Evening 15)  Aspir-Low 81 Mg Tbec (Aspirin) .... One Tablet Daiy 16)  Valtrex 1 Gm Tabs (Valacyclovir Hcl) .... One Daily As Needed (Takes On Avg One Per Month)  Allergies (verified): 1)  ! 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 Eyes:  Denies discharge and red eye. Endo:  Denies cold intolerance, excessive  hunger, excessive thirst, and heat intolerance; fasting blood sugars when checked are seldom over 110. Heme:  Denies abnormal bruising and bleeding. Allergy:  Denies hives or rash and itching eyes.  Physical Exam  General:  Well-developed,obese no acute distress; alert,appropriate and cooperative throughout examination,  HEENT: No facial asymmetry,  EOMI,noy  sinus tenderness, TM's Clear, oropharynx  pink and moist.   Chest: clear to ascultation CVS: S1, S2, No murmurs, No S3.   Abd: Soft, Nontender.  MS: Adequate ROM spine, hips, shoulders and knees.  Ext: No edema.   CNS: CN 2-12  intact, power tone and sensation normal throughout.   Skin: Intact, no visible lesions or rashes.  Psych: Good eye contact, normal affect.  Memory intact, not anxious or depressed appearing.    Impression & Recommendations:  Problem # 1:  DM (ICD-250.00) Assessment Comment Only  Her updated medication list for this problem includes:    Diovan Hct 320-25 Mg Tabs (Valsartan-hydrochlorothiazide) .Marland Kitchen... Take 1 tablet by mouth once a day    Metformin Hcl 500 Mg Tabs (Metformin hcl) .Marland Kitchen... 2 tablets in themorning and one in the evening    Aspir-low 81 Mg Tbec (Aspirin) ..... One tablet daiy Patient advised to reduce carbs and sweets, commit to regular physical activity, take meds as prescribed, test blood sugars as directed, and attempt to lose weight , to improve blood sugar control.  Orders: T- Hemoglobin A1C ([id number]) T- Hemoglobin A1C ([id number]) T-Urine Microalbumin w/creat. ratio [phone])  Labs Reviewed: Creat: 0.86 (08/16/2010)    Reviewed HgBA1c results: 7.2 (08/16/2010)  6.4 (04/24/2010)  Problem # 2:  OBESITY (ICD-278.00) Assessment: Improved  Ht: 65.5 (12/06/2010)   Wt: 230.25 (12/06/2010)   BMI: 37.87 (12/06/2010) therapeutic lifestyle change discussed and encouraged  Problem # 3:  HYPERTENSION (ICD-401.9) Assessment: Improved  Her updated medication list for this problem includes:    Diovan Hct 320-25 Mg Tabs (Valsartan-hydrochlorothiazide) .Marland Kitchen... Take 1 tablet by mouth once a day    Amlodipine Besylate 2.5 Mg Tabs (Amlodipine besylate) .Marland Kitchen... Take 1 tablet by mouth once a day  Orders: T-Basic Metabolic Panel [id number]) 140/80130/70 Prior BP: 160/90 (10/12/2010)  Labs Reviewed: K+: 3.8 (08/16/2010) Creat: : 0.86 (08/16/2010)   Chol: 181 (08/29/2009)   HDL: 55 (08/29/2009)   LDL: 95 (08/29/2009)   TG: 153 (08/29/2009)  Complete Medication List: 1)  Klor-con M20 20 Meq Tbcr (Potassium chloride crys cr) .... Take 1 tablet by mouth two times a  day 2)  Diovan Hct 320-25 Mg Tabs (Valsartan-hydrochlorothiazide) .... Take 1 tablet by mouth once a day 3)  Centrum Silver Tabs (Multiple vitamins-minerals) .... Take 1 tablet by mouth once a day 4)  Oscal 500/200 D-3 500-200 Mg-unit Tabs (Calcium-vitamin d) .... Take 1 tablet by mouth three times a day 5)  Pantoprazole Sodium 40 Mg Tbec (Pantoprazole sodium) .... Take 1 tablet by mouth once a day 6)  Dexamethasone 4 Mg Tabs (Dexamethasone) .... Uad 7)  Revlimid 10 Mg Caps (Lenalidomide) .... Take for 21 days then 7 days off 8)  Loratadine Allergy Relief 10 Mg Tbdp (Loratadine) .... Take one tab by mouth daily 9)  Alendronate Sodium 70 Mg Tabs (Alendronate sodium) .... One tab by mouth every week 10)  Lumigan 0.03 % Soln (Bimatoprost) .... One drop in each eye at bedtime 11)  Onetouch Ultra Test Strp (Glucose blood) .... Uad 12)  Onetouch Ultrasoft Lancets Misc (Lancets) .... Once daily testing 13)  Premarin 0.625 Mg/gm Crea (Estrogens, conjugated) .... Insert 1gm vaginally 3 times per  week 14)  Metformin Hcl 500 Mg Tabs (Metformin hcl) .... 2 tablets in themorning and one in the evening 15)  Aspir-low 81 Mg Tbec (Aspirin) .... One tablet daiy 16)  Valtrex 1 Gm Tabs (Valacyclovir hcl) .... One daily as needed (takes on avg one per month) 17)  Diovan Hct 320-25 Mg Tabs (Valsartan-hydrochlorothiazide) .... Take 1 tablet by mouth once a day 18)  Amlodipine Besylate 2.5 Mg Tabs (Amlodipine besylate) .... Take 1 tablet by mouth once a day  Other Orders: T-Lipid Profile ([id number])  Patient Instructions: 1)  Please schedule a follow-up appointment in 3.5 to 4 months. 2)  New additional med for bP, bP is 140/80 3)  hBA1C and chem 7 and lipids fasting asap 4)  Microalb today Prescriptions: AMLODIPINE BESYLATE 2.5 MG TABS (AMLODIPINE BESYLATE) Take 1 tablet by mouth once a day  #30 x 3   Entered and Authorized by:   [name] MD   Signed by:   [name] MD on 12/06/2010    Method used:   Electronically to        [facility]* (retail)       [address] [place], [place]  [address]       Ph: [phone]       Fax: [phone]   RxID:   [id number] DIOVAN HCT 320-25 MG TABS (VALSARTAN-HYDROCHLOROTHIAZIDE) Take 1 tablet by mouth once a day  #30 x 4   Entered and Authorized by:   [name] MD   Signed by:   [name] MD on 12/06/2010   Method used:   Electronically to        [facility]* (retail)       [address]       [place], [place]  [address]       Ph: [phone]       Fax: [phone]   RxID:   [id number]    Orders Added: 1)  Est. Patient Level IV [51761] 2)  T-Lipid Profile [[id number]] 3)  T-Basic Metabolic Panel [[id number]] 4)  T- Hemoglobin A1C [83036-23375] 5)  T- Hemoglobin A1C [83036-23375] 6)  T-Urine Microalbumin w/creat. ratio [[id number]]

## 2011-02-01 NOTE — Assessment & Plan Note (Signed)
Summary: SINUS   Vital Signs:  Patient profile:   60 year old female Menstrual status:  hysterectomy Height:      65.5 inches Weight:      229 pounds BMI:     37.66 O2 Sat:      97 % on Room air Pulse rate:   112 / minute Pulse rhythm:   regular BP sitting:   110 / 62  (left arm)  Vitals Entered By: Adella Hare LPN (December 19, 2010 1:32 PM)  Nutrition Counseling: Patient's BMI is greater than 25 and therefore counseled on weight management options.  O2 Flow:  Room air CC: cough, headache, nasal congestion Is Patient Diabetic? Yes Did you bring your meter with you today? No   Primary Care Provider:  simpson  CC:  cough, headache, and nasal congestion.  History of Present Illness: head congestion x 1 week, yellow drainage intermittent chills no fever. Sore throat , cough,which is  non productive.  Denies chest pain, palpitations, PND, orthopnea or leg swelling. Denies abdominal pain, nausea, vomitting, diarrhea or constipation. Denies change in bowel movements or bloody stool. Denies dysuria , frequency, incontinence or hesitancy. Denies  joint pain, swelling, or reduced mobility. Denies headaches, vertigo, seizures. Denies depression, anxiety or insomnia. Denies  rash, lesions, or itch.      Current Medications (verified): 1)  Klor-Con M20 20 Meq  Tbcr (Potassium Chloride Crys Cr) .... Take 1 Tablet By Mouth Two Times A Day 2)  Diovan Hct 320-25 Mg  Tabs (Valsartan-Hydrochlorothiazide) .... Take 1 Tablet By Mouth Once A Day 3)  Centrum Silver   Tabs (Multiple Vitamins-Minerals) .... Take 1 Tablet By Mouth Once A Day 4)  Oscal 500/200 D-3 500-200 Mg-Unit  Tabs (Calcium-Vitamin D) .... Take 1 Tablet By Mouth Three Times A Day 5)  Pantoprazole Sodium 40 Mg  Tbec (Pantoprazole Sodium) .... Take 1 Tablet By Mouth Once A Day 6)  Dexamethasone 4 Mg  Tabs (Dexamethasone) .... Uad 7)  Revlimid 10 Mg Caps (Lenalidomide) .... Take For 21 Days Then 7 Days Off 8)  Loratadine  Allergy Relief 10 Mg Tbdp (Loratadine) .... Take One Tab By Mouth Daily 9)  Alendronate Sodium 70 Mg Tabs (Alendronate Sodium) .... One Tab By Mouth Every Week 10)  Lumigan 0.03 % Soln (Bimatoprost) .... One Drop in Each Eye At Bedtime 11)  Onetouch Ultra Test  Strp (Glucose Blood) .... Uad 12)  Onetouch Ultrasoft Lancets  Misc (Lancets) .... Once Daily Testing 13)  Premarin 0.625 Mg/gm Crea (Estrogens, Conjugated) .... Insert 1gm Vaginally 3 Times Per Week 14)  Metformin Hcl 500 Mg Tabs (Metformin Hcl) .... 2 Tablets in Themorning and One in The Evening 15)  Aspir-Low 81 Mg Tbec (Aspirin) .... One Tablet Daiy 16)  Valtrex 1 Gm Tabs (Valacyclovir Hcl) .... One Daily As Needed (Takes On Avg One Per Month) 17)  Diovan Hct 320-25 Mg Tabs (Valsartan-Hydrochlorothiazide) .... Take 1 Tablet By Mouth Once A Day 18)  Amlodipine Besylate 2.5 Mg Tabs (Amlodipine Besylate) .... Take 1 Tablet By Mouth Once A Day  Allergies (verified): 1)  ! Codeine  Review of Systems      See HPI General:  Complains of chills, fatigue, fever, and sleep disorder. Eyes:  Denies blurring and discharge. Endo:  Denies cold intolerance, excessive hunger, excessive thirst, and excessive urination. Heme:  Denies abnormal bruising and bleeding. Allergy:  Complains of seasonal allergies; denies hives or rash and itching eyes.  Physical Exam  General:  Well-developed,well-nourished,in no acute  distress; alert,appropriate and cooperative throughout examination HEENT: No facial asymmetry,  EOMI,maxillary  sinus tenderness, TM's Clear, oropharynx  pink and moist.   Chest: Clear to auscultation bilaterally.  CVS: S1, S2, No murmurs, No S3.   Abd: Soft, Nontender.  MS: Adequate ROM spine, hips, shoulders and knees.  Ext: No edema.   CNS: CN 2-12 intact, power tone and sensation normal throughout.   Skin: Intact, no visible lesions or rashes.  Psych: Good eye contact, normal affect.  Memory intact, not anxious or depressed  appearing.    Impression & Recommendations:  Problem # 1:  ACUTE SINUSITIS, UNSPECIFIED (ICD-461.9) Assessment Comment Only  Her updated medication list for this problem includes:    Penicillin V Potassium 500 Mg Tabs (Penicillin v potassium) .Marland Kitchen... Take 1 tablet by mouth three times a day  Orders: Depo- Medrol 80mg  (J1040) Rocephin  250mg  (N8295) Admin of Therapeutic Inj  intramuscular or subcutaneous (62130)  Problem # 2:  HYPERTENSION (ICD-401.9) Assessment: Improved  Her updated medication list for this problem includes:    Diovan Hct 320-25 Mg Tabs (Valsartan-hydrochlorothiazide) .Marland Kitchen... Take 1 tablet by mouth once a day    Amlodipine Besylate 2.5 Mg Tabs (Amlodipine besylate) .Marland Kitchen... Take 1 tablet by mouth once a day  BP today: 110/62 Prior BP: 140/80 (12/06/2010)  Labs Reviewed: K+: 3.8 (08/16/2010) Creat: : 0.86 (08/16/2010)   Chol: 181 (08/29/2009)   HDL: 55 (08/29/2009)   LDL: 95 (08/29/2009)   TG: 153 (08/29/2009)  Problem # 3:  DM (ICD-250.00) Assessment: Improved  Her updated medication list for this problem includes:    Diovan Hct 320-25 Mg Tabs (Valsartan-hydrochlorothiazide) .Marland Kitchen... Take 1 tablet by mouth once a day    Metformin Hcl 500 Mg Tabs (Metformin hcl) .Marland Kitchen... 2 tablets in themorning and one in the evening    Aspir-low 81 Mg Tbec (Aspirin) ..... One tablet daiy  Labs Reviewed: Creat: 0.86 (08/16/2010)    Reviewed HgBA1c results: 7.2 (08/16/2010)  6.4 (04/24/2010)  Complete Medication List: 1)  Klor-con M20 20 Meq Tbcr (Potassium chloride crys cr) .... Take 1 tablet by mouth two times a day 2)  Diovan Hct 320-25 Mg Tabs (Valsartan-hydrochlorothiazide) .... Take 1 tablet by mouth once a day 3)  Centrum Silver Tabs (Multiple vitamins-minerals) .... Take 1 tablet by mouth once a day 4)  Oscal 500/200 D-3 500-200 Mg-unit Tabs (Calcium-vitamin d) .... Take 1 tablet by mouth three times a day 5)  Pantoprazole Sodium 40 Mg Tbec (Pantoprazole sodium) .... Take 1  tablet by mouth once a day 6)  Dexamethasone 4 Mg Tabs (Dexamethasone) .... Uad 7)  Revlimid 10 Mg Caps (Lenalidomide) .... Take for 21 days then 7 days off 8)  Loratadine Allergy Relief 10 Mg Tbdp (Loratadine) .... Take one tab by mouth daily 9)  Alendronate Sodium 70 Mg Tabs (Alendronate sodium) .... One tab by mouth every week 10)  Lumigan 0.03 % Soln (Bimatoprost) .... One drop in each eye at bedtime 11)  Onetouch Ultra Test Strp (Glucose blood) .... Uad 12)  Onetouch Ultrasoft Lancets Misc (Lancets) .... Once daily testing 13)  Premarin 0.625 Mg/gm Crea (Estrogens, conjugated) .... Insert 1gm vaginally 3 times per week 14)  Metformin Hcl 500 Mg Tabs (Metformin hcl) .... 2 tablets in themorning and one in the evening 15)  Aspir-low 81 Mg Tbec (Aspirin) .... One tablet daiy 16)  Valtrex 1 Gm Tabs (Valacyclovir hcl) .... One daily as needed (takes on avg one per month) 17)  Diovan Hct 320-25 Mg Tabs (  Valsartan-hydrochlorothiazide) .... Take 1 tablet by mouth once a day 18)  Amlodipine Besylate 2.5 Mg Tabs (Amlodipine besylate) .... Take 1 tablet by mouth once a day 19)  Penicillin V Potassium 500 Mg Tabs (Penicillin v potassium) .... Take 1 tablet by mouth three times a day 20)  Mucinex  .... One tablet twice daily for 3 days , then one daily for 3 21)  Fluconazole 150 Mg Tabs (Fluconazole) .... Take 1 tablet by mouth once a day as needed for vaginal itch  Patient Instructions: 1)  f/u as before 2)  you will  be treated for sinusitis. you will get an injection in the office, and penicillin is sent to your pharmacy. 3)  Mucinex samples one twice daily for 3 days , then one daily, for a few more days.  4)  You will get depomedrol in the office and a dose pack of prednisone is sent in Prescriptions: PREDNISONE (PAK) 5 MG TABS (PREDNISONE) Use as directed  #21 x 0   Entered and Authorized by:   [name] MD   Signed by:   [name] MD on 12/19/2010   Method used:    Electronically to        [facility]* (retail)       [address]       [place], [place]  [address]       Ph: [phone]       Fax: [phone]   RxID:   [id number] PENICILLIN V POTASSIUM 500 MG TABS (PENICILLIN V POTASSIUM) Take 1 tablet by mouth three times a day  #30 x 0   Entered and Authorized by:   [name] MD   Signed by:   [name] MD on 12/19/2010   Method used:   Electronically to        [facility]* (retail)       [address]       [place], [place]  [address]       Ph: [phone]       Fax: [phone]   RxID:   [id number]    Medication Administration  Injection # 1:    Medication: Depo- Medrol 80mg     Diagnosis: ACUTE SINUSITIS, UNSPECIFIED (ICD-461.9)    Route: IM    Site: RUOQ gluteus    Exp Date: 06/12    Lot #: [id number]    Mfr: Pharmacia    Patient tolerated injection without complications    Given by: Adella Hare LPN (December 19, 2010 2:38 PM)  Injection # 2:    Medication: Rocephin  250mg     Diagnosis: ACUTE SINUSITIS, UNSPECIFIED (ICD-461.9)    Route: IM    Site: LUOQ gluteus    Exp Date: 05/14    Lot #: [id number]    Mfr: NOVAPLUS    Comments: ROCEPHIN 500MG  GIVEN    Patient tolerated injection without complications    Given by: Adella Hare LPN (December 19, 2010 2:39 PM)  Orders Added: 1)  Depo- Medrol 80mg  [J1040] 2)  Rocephin  250mg  [J0696] 3)  Admin of Therapeutic Inj  intramuscular or subcutaneous [96372] 4)  Est. Patient Level IV [06301]     Medication Administration  Injection # 1:    Medication: Depo- Medrol 80mg     Diagnosis: ACUTE SINUSITIS, UNSPECIFIED (ICD-461.9)    Route: IM    Site: RUOQ gluteus    Exp Date: 06/12  Lot #: [id number]    Mfr: Pharmacia    Patient tolerated injection without complications    Given by: Adella Hare LPN (December 19, 2010 2:38 PM)  Injection # 2:    Medication: Rocephin  250mg     Diagnosis:  ACUTE SINUSITIS, UNSPECIFIED (ICD-461.9)    Route: IM    Site: LUOQ gluteus    Exp Date: 05/14    Lot #: [id number]    Mfr: NOVAPLUS    Comments: ROCEPHIN 500MG  GIVEN    Patient tolerated injection without complications    Given by: Adella Hare LPN (December 19, 2010 2:39 PM)  Orders Added: 1)  Depo- Medrol 80mg  [J1040] 2)  Rocephin  250mg  [J0696] 3)  Admin of Therapeutic Inj  intramuscular or subcutaneous [96372] 4)  Est. Patient Level IV [69629]

## 2011-02-01 NOTE — Assessment & Plan Note (Signed)
Summary: LEG   Vital Signs:  Patient profile:   60 year old female Menstrual status:  hysterectomy Height:      65.5 inches Weight:      233.50 pounds BMI:     38.40 O2 Sat:      96 % Pulse rate:   109 / minute Resp:     16 per minute BP sitting:   128 / 70  (left arm)  Vitals Entered By: Everitt Amber LPN (January 15, 2011 1:22 PM)  Nutrition Counseling: Patient's BMI is greater than 25 and therefore counseled on weight management options. CC: she twisted her ankle after christmas and its not hurting as bad as it was but she wanted to have it checked out, also her sinuses have been burning   Primary Care Provider:  simpson  CC:  she twisted her ankle after christmas and its not hurting as bad as it was but she wanted to have it checked out and also her sinuses have been burning.  History of Present Illness: Reports  that she has been doing fairly well. She is specifically concerned about ankle pain following an injury about 2 weeks ago, it remains swollen and painful. Denies recent fever or chills. Denies sinus pressure, nasal congestion , ear pain or sore throat. Denies chest congestion, or cough productive of sputum. Denies chest pain, palpitations, PND, orthopnea or leg swelling. Denies abdominal pain, nausea, vomitting, diarrhea or constipation. Denies change in bowel movements or bloody stool. Denies dysuria , frequency, incontinence or hesitancyDenies  joint pain, swelling, or reduced mobility. Denies headaches, vertigo, seizures. Denies depression, anxiety or insomnia. Denies  rash, lesions, or itch.     Current Medications (verified): 1)  Klor-Con M20 20 Meq  Tbcr (Potassium Chloride Crys Cr) .... Take 1 Tablet By Mouth Two Times A Day 2)  Diovan Hct 320-25 Mg  Tabs (Valsartan-Hydrochlorothiazide) .... Take 1 Tablet By Mouth Once A Day 3)  Centrum Silver   Tabs (Multiple Vitamins-Minerals) .... Take 1 Tablet By Mouth Once A Day 4)  Oscal 500/200 D-3 500-200 Mg-Unit   Tabs (Calcium-Vitamin D) .... Take 1 Tablet By Mouth Three Times A Day 5)  Pantoprazole Sodium 40 Mg  Tbec (Pantoprazole Sodium) .... Take 1 Tablet By Mouth Once A Day 6)  Dexamethasone 4 Mg  Tabs (Dexamethasone) .... Uad 7)  Revlimid 10 Mg Caps (Lenalidomide) .... Take For 21 Days Then 7 Days Off 8)  Loratadine Allergy Relief 10 Mg Tbdp (Loratadine) .... Take One Tab By Mouth Daily 9)  Alendronate Sodium 70 Mg Tabs (Alendronate Sodium) .... One Tab By Mouth Every Week 10)  Lumigan 0.03 % Soln (Bimatoprost) .... One Drop in Each Eye At Bedtime 11)  Onetouch Ultra Test  Strp (Glucose Blood) .... Uad 12)  Onetouch Ultrasoft Lancets  Misc (Lancets) .... Once Daily Testing 13)  Premarin 0.625 Mg/gm Crea (Estrogens, Conjugated) .... Insert 1gm Vaginally 3 Times Per Week 14)  Metformin Hcl 500 Mg Tabs (Metformin Hcl) .... 2 Tablets in Themorning and One in The Evening 15)  Aspir-Low 81 Mg Tbec (Aspirin) .... One Tablet Daiy 16)  Valtrex 1 Gm Tabs (Valacyclovir Hcl) .... One Daily As Needed (Takes On Avg One Per Month) 17)  Amlodipine Besylate 2.5 Mg Tabs (Amlodipine Besylate) .... Take 1 Tablet By Mouth Once A Day  Allergies (verified): 1)  ! Codeine  Review of Systems      See HPI General:  Complains of fatigue. Eyes:  Denies discharge and red eye. ENT:  Complains of nasal congestion; nose is dry . MS:  Complains of joint pain and stiffness; left ankle pain with swelling, she twisted this on dec 27, still not fully weight bearing. Endo:  Denies excessive hunger, excessive thirst, and excessive urination; fastings range from 100 to 130,testsonce daily .  Physical Exam  General:  Well-developed,well-nourished,in no acute distress; alert,appropriate and cooperative throughout examination HEENT: No facial asymmetry,  EOMIno sinus tenderness, TM's Clear, oropharynx  pink and moist.   Chest: Clear to auscultation bilaterally.  CVS: S1, S2, No murmurs, No S3.   Abd: Soft, Nontender.  MS:  Adequate ROM spine, hips, shoulders and knees. Left ankle swollen with tenderness Ext: No edema.   CNS: CN 2-12 intact, power tone and sensation normal throughout.   Skin: Intact, no visible lesions or rashes.  Psych: Good eye contact, normal affect.  Memory intact, not anxious or depressed appearing.    Impression & Recommendations:  Problem # 1:  ANKLE PAIN, LEFT (ICD-719.47) Assessment Comment Only  Orders: Radiology other (Radiology Other) Orthopedic Referral (Ortho) Radiology Referral (Radiology)  Problem # 2:  OBESITY (ICD-278.00) Assessment: Deteriorated  Ht: 65.5 (01/15/2011)   Wt: 233.50 (01/15/2011)   BMI: 38.40 (01/15/2011) therapeutic lifestyle change discussed and encouraged  Problem # 3:  HYPERTENSION (ICD-401.9) Assessment: Unchanged  The following medications were removed from the medication list:    Diovan Hct 320-25 Mg Tabs (Valsartan-hydrochlorothiazide) .Marland Kitchen... Take 1 tablet by mouth once a day Her updated medication list for this problem includes:    Diovan Hct 320-25 Mg Tabs (Valsartan-hydrochlorothiazide) .Marland Kitchen... Take 1 tablet by mouth once a day    Amlodipine Besylate 2.5 Mg Tabs (Amlodipine besylate) .Marland Kitchen... Take 1 tablet by mouth once a day  BP today: 128/70 Prior BP: 110/62 (12/19/2010)  Labs Reviewed: K+: 3.8 (08/16/2010) Creat: : 0.86 (08/16/2010)   Chol: 181 (08/29/2009)   HDL: 55 (08/29/2009)   LDL: 95 (08/29/2009)   TG: 153 (08/29/2009)  Problem # 4:  DM (ICD-250.00) Assessment: Comment Only  The following medications were removed from the medication list:    Diovan Hct 320-25 Mg Tabs (Valsartan-hydrochlorothiazide) .Marland Kitchen... Take 1 tablet by mouth once a day Her updated medication list for this problem includes:    Diovan Hct 320-25 Mg Tabs (Valsartan-hydrochlorothiazide) .Marland Kitchen... Take 1 tablet by mouth once a day    Metformin Hcl 500 Mg Tabs (Metformin hcl) .Marland Kitchen... 2 tablets in themorning and one in the evening    Aspir-low 81 Mg Tbec (Aspirin) .....  One tablet daiy Pt advised to reduce carbohydrate intake, espescially sweets, and to start regular physical activity, at least 30 minutes 5 days weekly, to enable weight loss, and reduce the risk of becoming diabetic   Labs Reviewed: Creat: 0.86 (08/16/2010)    Reviewed HgBA1c results: 7.2 (08/16/2010)  6.4 (04/24/2010)  Complete Medication List: 1)  Klor-con M20 20 Meq Tbcr (Potassium chloride crys cr) .... Take 1 tablet by mouth two times a day 2)  Diovan Hct 320-25 Mg Tabs (Valsartan-hydrochlorothiazide) .... Take 1 tablet by mouth once a day 3)  Centrum Silver Tabs (Multiple vitamins-minerals) .... Take 1 tablet by mouth once a day 4)  Oscal 500/200 D-3 500-200 Mg-unit Tabs (Calcium-vitamin d) .... Take 1 tablet by mouth three times a day 5)  Pantoprazole Sodium 40 Mg Tbec (Pantoprazole sodium) .... Take 1 tablet by mouth once a day 6)  Dexamethasone 4 Mg Tabs (Dexamethasone) .... Uad 7)  Revlimid 10 Mg Caps (Lenalidomide) .... Take for 21 days then 7 days  off 8)  Loratadine Allergy Relief 10 Mg Tbdp (Loratadine) .... Take one tab by mouth daily 9)  Alendronate Sodium 70 Mg Tabs (Alendronate sodium) .... One tab by mouth every week 10)  Lumigan 0.03 % Soln (Bimatoprost) .... One drop in each eye at bedtime 11)  Onetouch Ultra Test Strp (Glucose blood) .... Uad 12)  Onetouch Ultrasoft Lancets Misc (Lancets) .... Once daily testing 13)  Premarin 0.625 Mg/gm Crea (Estrogens, conjugated) .... Insert 1gm vaginally 3 times per week 14)  Metformin Hcl 500 Mg Tabs (Metformin hcl) .... 2 tablets in themorning and one in the evening 15)  Aspir-low 81 Mg Tbec (Aspirin) .... One tablet daiy 16)  Valtrex 1 Gm Tabs (Valacyclovir hcl) .... One daily as needed (takes on avg one per month) 17)  Amlodipine Besylate 2.5 Mg Tabs (Amlodipine besylate) .... Take 1 tablet by mouth once a day  Patient Instructions: 1)  F/U as before.Marland Kitchen 2)  you need an xray today ,we will call with result , and you are being  referred to orthopedics in [place], we will call with appt. 3)  use vaseline in your nostrils for dryness, likely due to heat in the home 4)  It is important that you exercise regularly at least 20 minutes 5 times a week. If you develop chest pain, have severe difficulty breathing, or feel very tired , stop exercising immediately and seek medical attention. 5)  You need to lose weight. Consider a lower calorie diet and regular exercise.  6)  Check your blood sugars regularly. If your readings are usually above 250 or below 70 you should contact our office. Prescriptions: METFORMIN HCL 500 MG TABS (METFORMIN HCL) 2 tablets in themorning and one in the evening  #90 x 3   Entered by:   Adella Hare LPN   Authorized by:   [name] MD   Signed by:   Adella Hare LPN on 16/10/9603   Method used:   Electronically to        [facility]* (retail)       [address] [place], [place]  [address]       Ph: [phone]       Fax: [phone]   RxID:   [id number] LORATADINE ALLERGY RELIEF 10 MG TBDP (LORATADINE) take one tab by mouth daily  #30 Each x 3   Entered by:   Adella Hare LPN   Authorized by:   [name] MD   Signed by:   Adella Hare LPN on 10/27/2535   Method used:   Electronically to        [facility]* (retail)       [address]       [place], [place]  [address]       Ph: [phone]       Fax: [phone]   RxID:   [id number] PANTOPRAZOLE SODIUM 40 MG  TBEC (PANTOPRAZOLE SODIUM) Take 1 tablet by mouth once a day  #30 Each x 3   Entered by:   Adella Hare LPN   Authorized by:   [name] MD   Signed by:   Adella Hare LPN on 32/35/5732   Method used:   Electronically to        [facility] (retail)       [address]  [place], [place]  [address]       Ph: [phone]       Fax: [phone]   RxID:    [id number]    Orders Added: 1)  Radiology other [Radiology Other] 2)  Est. Patient Level IV [84132] 3)  Orthopedic Referral [Ortho] 4)  Radiology Referral [Radiology]    diovan 320/25 samples given x4 [id number] 2/13 Adella Hare LPN  January 15, 2011 2:17 PM

## 2011-02-06 ENCOUNTER — Other Ambulatory Visit: Payer: Self-pay | Admitting: Hematology and Oncology

## 2011-02-06 ENCOUNTER — Encounter (HOSPITAL_BASED_OUTPATIENT_CLINIC_OR_DEPARTMENT_OTHER): Payer: 59 | Admitting: Hematology and Oncology

## 2011-02-06 DIAGNOSIS — Z23 Encounter for immunization: Secondary | ICD-10-CM

## 2011-02-06 DIAGNOSIS — C9000 Multiple myeloma not having achieved remission: Secondary | ICD-10-CM

## 2011-02-06 LAB — CBC WITH DIFFERENTIAL/PLATELET
Basophils Absolute: 0 10*3/uL (ref 0.0–0.1)
HCT: 36.2 % (ref 34.8–46.6)
HGB: 12.2 g/dL (ref 11.6–15.9)
LYMPH%: 13.2 % — ABNORMAL LOW (ref 14.0–49.7)
MCH: 33.3 pg (ref 25.1–34.0)
MONO#: 0.8 10*3/uL (ref 0.1–0.9)
NEUT%: 78.1 % — ABNORMAL HIGH (ref 38.4–76.8)
Platelets: 125 10*3/uL — ABNORMAL LOW (ref 145–400)
lymph#: 1.4 10*3/uL (ref 0.9–3.3)

## 2011-02-07 NOTE — Letter (Signed)
Summary: hematology  hematology   Imported By: Lind Guest 01/31/2011 11:07:07  _____________________________________________________________________  External Attachment:    Type:   Image     Comment:   External Document

## 2011-02-21 ENCOUNTER — Encounter (HOSPITAL_BASED_OUTPATIENT_CLINIC_OR_DEPARTMENT_OTHER): Payer: 59 | Admitting: Hematology and Oncology

## 2011-02-21 ENCOUNTER — Other Ambulatory Visit: Payer: Self-pay | Admitting: Hematology and Oncology

## 2011-02-21 DIAGNOSIS — Z23 Encounter for immunization: Secondary | ICD-10-CM

## 2011-02-21 DIAGNOSIS — C9000 Multiple myeloma not having achieved remission: Secondary | ICD-10-CM

## 2011-02-21 LAB — CBC WITH DIFFERENTIAL/PLATELET
BASO%: 0.1 % (ref 0.0–2.0)
EOS%: 1 % (ref 0.0–7.0)
Eosinophils Absolute: 0.1 10*3/uL (ref 0.0–0.5)
MCHC: 32.8 g/dL (ref 31.5–36.0)
MCV: 99.6 fL (ref 79.5–101.0)
MONO%: 11.9 % (ref 0.0–14.0)
NEUT#: 7.7 10*3/uL — ABNORMAL HIGH (ref 1.5–6.5)
RBC: 3.89 10*6/uL (ref 3.70–5.45)
RDW: 14.9 % — ABNORMAL HIGH (ref 11.2–14.5)

## 2011-02-21 LAB — COMPREHENSIVE METABOLIC PANEL
ALT: 57 U/L — ABNORMAL HIGH (ref 0–35)
AST: 36 U/L (ref 0–37)
Albumin: 4 g/dL (ref 3.5–5.2)
Alkaline Phosphatase: 68 U/L (ref 39–117)
Glucose, Bld: 166 mg/dL — ABNORMAL HIGH (ref 70–99)
Potassium: 3.7 mEq/L (ref 3.5–5.3)
Sodium: 135 mEq/L (ref 135–145)
Total Protein: 6.3 g/dL (ref 6.0–8.3)

## 2011-02-28 ENCOUNTER — Telehealth: Payer: Self-pay | Admitting: Family Medicine

## 2011-02-28 ENCOUNTER — Encounter (HOSPITAL_BASED_OUTPATIENT_CLINIC_OR_DEPARTMENT_OTHER): Payer: 59 | Admitting: Hematology and Oncology

## 2011-02-28 ENCOUNTER — Other Ambulatory Visit: Payer: Self-pay | Admitting: Hematology and Oncology

## 2011-02-28 DIAGNOSIS — C9000 Multiple myeloma not having achieved remission: Secondary | ICD-10-CM

## 2011-03-01 ENCOUNTER — Ambulatory Visit (INDEPENDENT_AMBULATORY_CARE_PROVIDER_SITE_OTHER): Payer: 59 | Admitting: Family Medicine

## 2011-03-01 ENCOUNTER — Ambulatory Visit: Payer: 59 | Admitting: Family Medicine

## 2011-03-01 ENCOUNTER — Other Ambulatory Visit: Payer: Self-pay | Admitting: Family Medicine

## 2011-03-01 ENCOUNTER — Ambulatory Visit (HOSPITAL_COMMUNITY)
Admission: RE | Admit: 2011-03-01 | Discharge: 2011-03-01 | Disposition: A | Discharge status: Home / Self Care | Payer: 59 | Source: Ambulatory Visit | Attending: Family Medicine | Admitting: Family Medicine

## 2011-03-01 ENCOUNTER — Encounter: Payer: Self-pay | Admitting: Family Medicine

## 2011-03-01 DIAGNOSIS — E119 Type 2 diabetes mellitus without complications: Secondary | ICD-10-CM | POA: Insufficient documentation

## 2011-03-01 DIAGNOSIS — J40 Bronchitis, not specified as acute or chronic: Secondary | ICD-10-CM | POA: Insufficient documentation

## 2011-03-01 DIAGNOSIS — J019 Acute sinusitis, unspecified: Secondary | ICD-10-CM

## 2011-03-01 DIAGNOSIS — R059 Cough, unspecified: Secondary | ICD-10-CM | POA: Insufficient documentation

## 2011-03-01 DIAGNOSIS — R062 Wheezing: Secondary | ICD-10-CM | POA: Insufficient documentation

## 2011-03-01 DIAGNOSIS — I10 Essential (primary) hypertension: Secondary | ICD-10-CM

## 2011-03-01 DIAGNOSIS — J209 Acute bronchitis, unspecified: Secondary | ICD-10-CM

## 2011-03-01 DIAGNOSIS — R05 Cough: Secondary | ICD-10-CM | POA: Insufficient documentation

## 2011-03-01 DIAGNOSIS — E669 Obesity, unspecified: Secondary | ICD-10-CM

## 2011-03-05 ENCOUNTER — Ambulatory Visit: Payer: 59 | Admitting: Family Medicine

## 2011-03-05 ENCOUNTER — Ambulatory Visit (HOSPITAL_COMMUNITY)
Admission: RE | Admit: 2011-03-05 | Discharge: 2011-03-05 | Disposition: A | Discharge status: Home / Self Care | Payer: 59 | Source: Ambulatory Visit | Attending: Hematology and Oncology | Admitting: Hematology and Oncology

## 2011-03-05 ENCOUNTER — Other Ambulatory Visit (HOSPITAL_COMMUNITY): Payer: 59

## 2011-03-05 DIAGNOSIS — M47814 Spondylosis without myelopathy or radiculopathy, thoracic region: Secondary | ICD-10-CM | POA: Insufficient documentation

## 2011-03-05 DIAGNOSIS — K8020 Calculus of gallbladder without cholecystitis without obstruction: Secondary | ICD-10-CM | POA: Insufficient documentation

## 2011-03-05 DIAGNOSIS — C9000 Multiple myeloma not having achieved remission: Secondary | ICD-10-CM | POA: Insufficient documentation

## 2011-03-05 DIAGNOSIS — M1710 Unilateral primary osteoarthritis, unspecified knee: Secondary | ICD-10-CM | POA: Insufficient documentation

## 2011-03-07 ENCOUNTER — Other Ambulatory Visit: Payer: Self-pay | Admitting: Hematology and Oncology

## 2011-03-07 ENCOUNTER — Encounter (HOSPITAL_BASED_OUTPATIENT_CLINIC_OR_DEPARTMENT_OTHER): Payer: 59 | Admitting: Hematology and Oncology

## 2011-03-07 DIAGNOSIS — C9000 Multiple myeloma not having achieved remission: Secondary | ICD-10-CM

## 2011-03-07 LAB — CBC WITH DIFFERENTIAL/PLATELET
BASO%: 0.4 % (ref 0.0–2.0)
Eosinophils Absolute: 0.1 10*3/uL (ref 0.0–0.5)
HCT: 38.6 % (ref 34.8–46.6)
MCHC: 33.2 g/dL (ref 31.5–36.0)
MONO#: 0.7 10*3/uL (ref 0.1–0.9)
NEUT#: 6.6 10*3/uL — ABNORMAL HIGH (ref 1.5–6.5)
NEUT%: 73.5 % (ref 38.4–76.8)
WBC: 8.9 10*3/uL (ref 3.9–10.3)
lymph#: 1.5 10*3/uL (ref 0.9–3.3)
nRBC: 0 % (ref 0–0)

## 2011-03-08 NOTE — Progress Notes (Signed)
Summary: needs to be seen  Phone Note Call from Patient   Summary of Call: pt has a cold and cancer center doc wanted to know if Dr. [name] would see her today. gave call to [facility]. But office called back. Told them no appts and told them I would send to doc to see what she would like for ofice to do.  bray [phone] Initial call taken by: Rudene Anda,  February 28, 2011 3:25 PM  Follow-up for Phone Call        please put patient on schedule tomorrow Follow-up by: Adella Hare LPN,  February 28, 2011 3:47 PM  Additional Follow-up for Phone Call Additional follow up Details #1::        Told to come at 8:30am thursday Additional Follow-up by: Everitt Amber LPN,  February 28, 2011 3:56 PM    Additional Follow-up for Phone Call Additional follow up Details #2::    coming in today  Follow-up by: Lind Guest,  March 01, 2011 9:37 AM

## 2011-03-13 NOTE — Assessment & Plan Note (Signed)
Summary: cold   Vital Signs:  Patient profile:   60 year old female Menstrual status:  hysterectomy Height:      65.5 inches Weight:      233 pounds BMI:     38.32 O2 Sat:      96 % Pulse rate:   101 / minute Pulse rhythm:   regular Resp:     16 per minute BP sitting:   136 / 90  (left arm)  Vitals Entered By: Everitt Amber LPN (February 28, 1609 2:28 PM)  Nutrition Counseling: Patient's BMI is greater than 25 and therefore counseled on weight management options. CC: c/o coughing and sneezing, phlegm beige in color. Feels pretty good but her oncologist wanted her to be seen since she is on chemo   Primary Care Provider:  Rabiah Goeser  CC:  c/o coughing and sneezing and phlegm beige in color. Feels pretty good but her oncologist wanted her to be seen since she is on chemo.  History of Present Illness: head congestion with cough productive of sputum x 4 days, Was to get chemo tomorrow, oncology doc wants treatment. Reports  that prior to this she had been well, she actually does not feel ill at this time, but is aware of the need for precaution since she is on immunosuppressant treatment Denies recent fever or chills.  Denies chest pain, palpitations, PND, orthopnea or leg swelling. Denies abdominal pain, nausea, vomitting, diarrhea or constipation. Denies change in bowel movements or bloody stool. Denies dysuria , frequency, incontinence or hesitancy. Denies  joint pain, swelling, or reduced mobility. Denies headaches, vertigo, seizures. Denies depression, anxiety or insomnia. Denies  rash, lesions, or itch.     Current Medications (verified): 1)  Klor-Con M20 20 Meq  Tbcr (Potassium Chloride Crys Cr) .... Take 1 Tablet By Mouth Two Times A Day 2)  Diovan Hct 320-25 Mg  Tabs (Valsartan-Hydrochlorothiazide) .... Take 1 Tablet By Mouth Once A Day 3)  Centrum Silver   Tabs (Multiple Vitamins-Minerals) .... Take 1 Tablet By Mouth Once A Day 4)  Oscal 500/200 D-3 500-200 Mg-Unit  Tabs  (Calcium-Vitamin D) .... Take 1 Tablet By Mouth Three Times A Day 5)  Pantoprazole Sodium 40 Mg  Tbec (Pantoprazole Sodium) .... Take 1 Tablet By Mouth Once A Day 6)  Dexamethasone 4 Mg  Tabs (Dexamethasone) .... Uad 7)  Revlimid 10 Mg Caps (Lenalidomide) .... Take For 21 Days Then 7 Days Off 8)  Loratadine Allergy Relief 10 Mg Tbdp (Loratadine) .... Take One Tab By Mouth Daily 9)  Alendronate Sodium 70 Mg Tabs (Alendronate Sodium) .... One Tab By Mouth Every Week 10)  Lumigan 0.03 % Soln (Bimatoprost) .... One Drop in Each Eye At Bedtime 11)  Onetouch Ultra Test  Strp (Glucose Blood) .... Uad 12)  Onetouch Ultrasoft Lancets  Misc (Lancets) .... Once Daily Testing 13)  Premarin 0.625 Mg/gm Crea (Estrogens, Conjugated) .... Insert 1gm Vaginally 3 Times Per Week 14)  Metformin Hcl 500 Mg Tabs (Metformin Hcl) .... 2 Tablets in Themorning and One in The Evening 15)  Aspir-Low 81 Mg Tbec (Aspirin) .... One Tablet Daiy 16)  Valtrex 1 Gm Tabs (Valacyclovir Hcl) .... One Daily As Needed (Takes On Avg One Per Month) 17)  Amlodipine Besylate 2.5 Mg Tabs (Amlodipine Besylate) .... Take 1 Tablet By Mouth Once A Day  Allergies (verified): 1)  ! Codeine  Review of Systems      See HPI General:  Complains of fatigue. ENT:  Complains of hoarseness,  nasal congestion, postnasal drainage, and sinus pressure; denies sore throat. Resp:  Complains of cough and sputum productive. Endo:  Denies cold intolerance, excessive thirst, excessive urination, and heat intolerance; fasting sugars seldom ove 120. Heme:  Denies abnormal bruising and bleeding. Allergy:  Complains of seasonal allergies.  Physical Exam  General:  Well-developed,well-nourished,in no acute distress; alert,appropriate and cooperative throughout examination HEENT: No facial asymmetry,  EOMI,maxillary  sinus tenderness, TM's Clear, oropharynx  pink and moist.   Chest: decreased air entry, scattered crackles  CVS: S1, S2, No murmurs, No S3.    Abd: Soft, Nontender.  MS: Adequate ROM spine, hips, shoulders and knees.  Ext: No edema.   CNS: CN 2-12 intact, power tone and sensation normal throughout.   Skin: Intact, no visible lesions or rashes.  Psych: Good eye contact, normal affect.  Memory intact, not anxious or depressed appearing.    Impression & Recommendations:  Problem # 1:  ACUTE SINUSITIS, UNSPECIFIED (ICD-461.9) Assessment Comment Only  Her updated medication list for this problem includes:    Septra Ds 800-160 Mg Tabs (Sulfamethoxazole-trimethoprim) .Marland Kitchen... Take 1 tablet by mouth two times a day    Tessalon Perles 100 Mg Caps (Benzonatate) .Marland Kitchen... Take 1 capsule by mouth three times a day  Orders: Rocephin  250mg  (X5400) Admin of Therapeutic Inj  intramuscular or subcutaneous (86761) Albuterol Sulfate Sol 1mg  unit dose (P5093) Ipratropium inhalation sol. unit dose (O6712) Nebulizer Tx (45809)  Problem # 2:  ACUTE BRONCHITIS (ICD-466.0) Assessment: Comment Only  Her updated medication list for this problem includes:    Septra Ds 800-160 Mg Tabs (Sulfamethoxazole-trimethoprim) .Marland Kitchen... Take 1 tablet by mouth two times a day    Tessalon Perles 100 Mg Caps (Benzonatate) .Marland Kitchen... Take 1 capsule by mouth three times a day  Orders: CXR- 2view (CXR)  Problem # 3:  DM (ICD-250.00) Assessment: Improved  Her updated medication list for this problem includes:    Diovan Hct 320-25 Mg Tabs (Valsartan-hydrochlorothiazide) .Marland Kitchen... Take 1 tablet by mouth once a day    Metformin Hcl 500 Mg Tabs (Metformin hcl) .Marland Kitchen... 2 tablets in themorning and one in the evening    Aspir-low 81 Mg Tbec (Aspirin) ..... One tablet daiy  Labs Reviewed: Creat: 0.86 (08/16/2010)    Reviewed HgBA1c results: 7.2 (08/16/2010), more recent value from outside lab  6.4 (04/24/2010)  Problem # 4:  OBESITY (ICD-278.00) Assessment: Comment Only  Ht: 65.5 (03/01/2011)   Wt: 233 (03/01/2011)   BMI: 38.32 (03/01/2011) therapeutic lifestyle change discussed  and encouraged  Problem # 5:  MULTIPLE  MYELOMA (ICD-203.00) Assessment: Comment Only currently on chemo  Problem # 6:  HYPERTENSION (ICD-401.9) Assessment: Deteriorated  Her updated medication list for this problem includes:    Diovan Hct 320-25 Mg Tabs (Valsartan-hydrochlorothiazide) .Marland Kitchen... Take 1 tablet by mouth once a day    Amlodipine Besylate 2.5 Mg Tabs (Amlodipine besylate) .Marland Kitchen... Take 1 tablet by mouth once a day  BP today: 136/90 Prior BP: 128/70 (01/15/2011)  Labs Reviewed: K+: 3.8 (08/16/2010) Creat: : 0.86 (08/16/2010)   Chol: 181 (08/29/2009)   HDL: 55 (08/29/2009)   LDL: 95 (08/29/2009)   TG: 153 (08/29/2009)  Complete Medication List: 1)  Klor-con M20 20 Meq Tbcr (Potassium chloride crys cr) .... Take 1 tablet by mouth two times a day 2)  Diovan Hct 320-25 Mg Tabs (Valsartan-hydrochlorothiazide) .... Take 1 tablet by mouth once a day 3)  Centrum Silver Tabs (Multiple vitamins-minerals) .... Take 1 tablet by mouth once a day 4)  Oscal  500/200 D-3 500-200 Mg-unit Tabs (Calcium-vitamin d) .... Take 1 tablet by mouth three times a day 5)  Pantoprazole Sodium 40 Mg Tbec (Pantoprazole sodium) .... Take 1 tablet by mouth once a day 6)  Dexamethasone 4 Mg Tabs (Dexamethasone) .... Uad 7)  Revlimid 10 Mg Caps (Lenalidomide) .... Take for 21 days then 7 days off 8)  Loratadine Allergy Relief 10 Mg Tbdp (Loratadine) .... Take one tab by mouth daily 9)  Alendronate Sodium 70 Mg Tabs (Alendronate sodium) .... One tab by mouth every week 10)  Lumigan 0.03 % Soln (Bimatoprost) .... One drop in each eye at bedtime 11)  Onetouch Ultra Test Strp (Glucose blood) .... Uad 12)  Onetouch Ultrasoft Lancets Misc (Lancets) .... Once daily testing 13)  Premarin 0.625 Mg/gm Crea (Estrogens, conjugated) .... Insert 1gm vaginally 3 times per week 14)  Metformin Hcl 500 Mg Tabs (Metformin hcl) .... 2 tablets in themorning and one in the evening 15)  Aspir-low 81 Mg Tbec (Aspirin) .... One tablet  daiy 16)  Valtrex 1 Gm Tabs (Valacyclovir hcl) .... One daily as needed (takes on avg one per month) 17)  Amlodipine Besylate 2.5 Mg Tabs (Amlodipine besylate) .... Take 1 tablet by mouth once a day 18)  Septra Ds 800-160 Mg Tabs (Sulfamethoxazole-trimethoprim) .... Take 1 tablet by mouth two times a day 19)  Tessalon Perles 100 Mg Caps (Benzonatate) .... Take 1 capsule by mouth three times a day 20)  Fluconazole 150 Mg Tabs (Fluconazole) .... Take 1 tablet by mouth once a day'as needed for vaginal itch  Patient Instructions: 1)  Please schedule a follow-up appointment in 3.5 months.Cancel April appt. 2)  Nurse visit in 1 week for pneumonia vaccine and TDaP 3)  You are being treated for acute bronchitis and sinusitis.You will get Rocephin 500mg  IM in the office, and meds are sent to the pharmacy. 4)  CXR pa and lateral today. Prescriptions: AMLODIPINE BESYLATE 2.5 MG TABS (AMLODIPINE BESYLATE) Take 1 tablet by mouth once a day  #30 x 3   Entered by:   Adella Hare LPN   Authorized by:   [name] MD   Signed by:   Adella Hare LPN on 16/10/9603   Method used:   Electronically to        [facility]* (retail)       [address] [place], [place]  [address]       Ph: [phone]       Fax: [phone]   RxID:   [id number] OSCAL 500/200 D-3 500-200 MG-UNIT  TABS (CALCIUM-VITAMIN D) Take 1 tablet by mouth three times a day  #90 x 3   Entered by:   Adella Hare LPN   Authorized by:   [name] MD   Signed by:   Adella Hare LPN on 10/27/2535   Method used:   Electronically to        [facility]* (retail)       [address]       [place], [place]  [address]       Ph: [phone]       Fax: [phone]   RxID:   [id number] KLOR-CON M20 20 MEQ  TBCR (POTASSIUM CHLORIDE CRYS CR) Take 1 tablet by mouth two times a day  #60 Each x 3   Entered by:   Adella Hare LPN  Authorized by:    [name] MD   Signed by:   Adella Hare LPN on 40/98/1191   Method used:   Electronically to        [facility]* (retail)       [address] [place], [place]  [address]       Ph: [phone]       Fax: [phone]   RxID:   [id number] FLUCONAZOLE 150 MG TABS (FLUCONAZOLE) Take 1 tablet by mouth once a day'as needed for vaginal itch  #3 x 0   Entered and Authorized by:   [name] MD   Signed by:   [name] MD on 03/01/2011   Method used:   Electronically to        [facility]* (retail)       [address]       [place], [place]  [address]       Ph: [phone]       Fax: [phone]   RxID:   [id number] TESSALON PERLES 100 MG CAPS (BENZONATATE) Take 1 capsule by mouth three times a day  #30 x 0   Entered and Authorized by:   [name] MD   Signed by:   [name] MD on 03/01/2011   Method used:   Electronically to        [facility]* (retail)       [address]       [place], [place]  [address]       Ph: [phone]       Fax: [phone]   RxID:   [id number] SEPTRA DS 800-160 MG TABS (SULFAMETHOXAZOLE-TRIMETHOPRIM) Take 1 tablet by mouth two times a day  #20 x 0   Entered and Authorized by:   [name] MD   Signed by:   [name] MD on 03/01/2011   Method used:   Electronically to        [facility]* (retail)       [address]       [place], [place]  [address]       Ph: [phone]       Fax: [phone]   RxID:   [id number]    Medication Administration  Injection # 1:    Medication: Rocephin  250mg     Diagnosis: ACUTE SINUSITIS, UNSPECIFIED (ICD-461.9)    Route: IM    Site: LUOQ gluteus    Exp Date: 08/14    Lot #: [id number]    Mfr: [email or web address]    Comments: rocephin 500mg  given    Patient tolerated injection without complications    Given by:  Adella Hare LPN (February 29, 2352 3:30 PM)  Medication # 1:    Medication: Albuterol Sulfate Sol 1mg  unit dose    Diagnosis: ACUTE SINUSITIS, UNSPECIFIED (ICD-461.9)    Dose: 2.5mg     Route: inhaled    Exp Date: 6/13    Lot #: [id number]    Mfr: nephron pharm    Patient tolerated medication without complications    Given by: Adella Hare LPN (March 01, 6143 3:31 PM)  Medication # 2:    Medication: Ipratropium inhalation sol. unit dose    Diagnosis: ACUTE SINUSITIS, UNSPECIFIED (ICD-461.9)    Dose: 0.5  Route: inhaled    Exp Date: 06/13    Lot #: [id number]    Mfr: nephron pharm    Patient tolerated medication without complications    Given by: Adella Hare LPN (February 28, 453 3:32 PM)  Orders Added: 1)  CXR- 2view [CXR] 2)  Est. Patient Level IV [09811] 3)  Rocephin  250mg  [J0696] 4)  Admin of Therapeutic Inj  intramuscular or subcutaneous [96372] 5)  Albuterol Sulfate Sol 1mg  unit dose [J7613] 6)  Ipratropium inhalation sol. unit dose [J7644] 7)  Nebulizer Tx [94640]     Medication Administration  Injection # 1:    Medication: Rocephin  250mg     Diagnosis: ACUTE SINUSITIS, UNSPECIFIED (ICD-461.9)    Route: IM    Site: LUOQ gluteus    Exp Date: 08/14    Lot #: [id number]    Mfr: [email or web address]    Comments: rocephin 500mg  given    Patient tolerated injection without complications    Given by: Adella Hare LPN (February 28, 9561 3:30 PM)  Medication # 1:    Medication: Albuterol Sulfate Sol 1mg  unit dose    Diagnosis: ACUTE SINUSITIS, UNSPECIFIED (ICD-461.9)    Dose: 2.5mg     Route: inhaled    Exp Date: 6/13    Lot #: [id number]    Mfr: nephron pharm    Patient tolerated medication without complications    Given by: Adella Hare LPN (March 01, 1307 3:31 PM)  Medication # 2:    Medication: Ipratropium inhalation sol. unit dose    Diagnosis: ACUTE SINUSITIS, UNSPECIFIED (ICD-461.9)    Dose: 0.5    Route: inhaled    Exp Date: 06/13    Lot #: [id number]    Mfr: nephron pharm    Patient  tolerated medication without complications    Given by: Adella Hare LPN (February 29, 9628 3:32 PM)  Orders Added: 1)  CXR- 2view [CXR] 2)  Est. Patient Level IV [52841] 3)  Rocephin  250mg  [J0696] 4)  Admin of Therapeutic Inj  intramuscular or subcutaneous [96372] 5)  Albuterol Sulfate Sol 1mg  unit dose [J7613] 6)  Ipratropium inhalation sol. unit dose [J7644] 7)  Nebulizer Tx [94640]  Appended Document: cold nebulizer tx given for bronchitis (not sinusitis)

## 2011-03-15 ENCOUNTER — Ambulatory Visit: Payer: 59 | Attending: Gynecologic Oncology | Admitting: Gynecologic Oncology

## 2011-03-15 ENCOUNTER — Encounter: Payer: Self-pay | Admitting: Family Medicine

## 2011-03-15 ENCOUNTER — Other Ambulatory Visit: Payer: Self-pay | Admitting: Gynecologic Oncology

## 2011-03-15 ENCOUNTER — Other Ambulatory Visit (HOSPITAL_COMMUNITY)
Admission: RE | Admit: 2011-03-15 | Discharge: 2011-03-15 | Disposition: A | Discharge status: Home / Self Care | Payer: 59 | Source: Ambulatory Visit | Attending: Gynecologic Oncology | Admitting: Gynecologic Oncology

## 2011-03-15 DIAGNOSIS — Z8540 Personal history of malignant neoplasm of unspecified female genital organ: Secondary | ICD-10-CM | POA: Insufficient documentation

## 2011-03-15 DIAGNOSIS — C9000 Multiple myeloma not having achieved remission: Secondary | ICD-10-CM | POA: Insufficient documentation

## 2011-03-15 DIAGNOSIS — N893 Dysplasia of vagina, unspecified: Secondary | ICD-10-CM | POA: Insufficient documentation

## 2011-03-20 NOTE — Consult Note (Signed)
Summary: Consultation Report  Consultation Report   Imported By: Lind Guest 03/16/2011 11:06:30  _____________________________________________________________________  External Attachment:    Type:   Image     Comment:   External Document

## 2011-03-21 LAB — CBC
HCT: 35.4 % — ABNORMAL LOW (ref 36.0–46.0)
Hemoglobin: 11.9 g/dL — ABNORMAL LOW (ref 12.0–15.0)
WBC: 7 10*3/uL (ref 4.0–10.5)

## 2011-03-21 LAB — BASIC METABOLIC PANEL
Chloride: 106 mEq/L (ref 96–112)
GFR calc non Af Amer: >60 mL/min (ref >60)
Glucose, Bld: 155 mg/dL — ABNORMAL HIGH (ref 70–99)
Potassium: 3.9 mEq/L (ref 3.5–5.1)
Sodium: 141 mEq/L (ref 135–145)

## 2011-03-21 LAB — DIFFERENTIAL
Eosinophils Relative: 0 % (ref 0–5)
Lymphocytes Relative: 15 % (ref 12–46)
Lymphs Abs: 1 10*3/uL (ref 0.7–4.0)
Monocytes Absolute: 0.7 10*3/uL (ref 0.1–1.0)

## 2011-04-03 ENCOUNTER — Other Ambulatory Visit: Payer: Self-pay | Admitting: Hematology and Oncology

## 2011-04-03 ENCOUNTER — Encounter (HOSPITAL_BASED_OUTPATIENT_CLINIC_OR_DEPARTMENT_OTHER): Payer: 59 | Admitting: Hematology and Oncology

## 2011-04-03 DIAGNOSIS — C9000 Multiple myeloma not having achieved remission: Secondary | ICD-10-CM

## 2011-04-03 LAB — BASIC METABOLIC PANEL
BUN: 10 mg/dL (ref 6–23)
Chloride: 102 mEq/L (ref 96–112)
Creatinine, Ser: 0.9 mg/dL (ref 0.40–1.20)

## 2011-04-03 LAB — CBC WITH DIFFERENTIAL/PLATELET
Basophils Absolute: 0.1 10*3/uL (ref 0.0–0.1)
EOS%: 0.6 % (ref 0.0–7.0)
HCT: 35.7 % (ref 34.8–46.6)
HGB: 11.9 g/dL (ref 11.6–15.9)
LYMPH%: 13.9 % — ABNORMAL LOW (ref 14.0–49.7)
MCH: 33.2 pg (ref 25.1–34.0)
MCV: 99.5 fL (ref 79.5–101.0)
MONO%: 6.9 % (ref 0.0–14.0)
NEUT%: 78 % — ABNORMAL HIGH (ref 38.4–76.8)
Platelets: 119 10*3/uL — ABNORMAL LOW (ref 145–400)

## 2011-04-04 ENCOUNTER — Encounter: Payer: Self-pay | Admitting: Family Medicine

## 2011-04-09 ENCOUNTER — Ambulatory Visit: Payer: Self-pay | Admitting: Family Medicine

## 2011-04-12 ENCOUNTER — Ambulatory Visit (INDEPENDENT_AMBULATORY_CARE_PROVIDER_SITE_OTHER): Payer: 59 | Admitting: Family Medicine

## 2011-04-12 VITALS — BP 150/80 | Wt 236.0 lb

## 2011-04-12 DIAGNOSIS — Z23 Encounter for immunization: Secondary | ICD-10-CM

## 2011-04-25 ENCOUNTER — Encounter (HOSPITAL_BASED_OUTPATIENT_CLINIC_OR_DEPARTMENT_OTHER): Payer: 59 | Admitting: Hematology and Oncology

## 2011-04-25 ENCOUNTER — Other Ambulatory Visit: Payer: Self-pay | Admitting: Hematology and Oncology

## 2011-04-25 DIAGNOSIS — C9000 Multiple myeloma not having achieved remission: Secondary | ICD-10-CM

## 2011-04-25 LAB — CBC WITH DIFFERENTIAL/PLATELET
Basophils Absolute: 0 10*3/uL (ref 0.0–0.1)
EOS%: 0.9 % (ref 0.0–7.0)
HGB: 12.5 g/dL (ref 11.6–15.9)
MCH: 33.1 pg (ref 25.1–34.0)
MCV: 99 fL (ref 79.5–101.0)
MONO%: 7.6 % (ref 0.0–14.0)
NEUT%: 77.8 % — ABNORMAL HIGH (ref 38.4–76.8)
RDW: 15.2 % — ABNORMAL HIGH (ref 11.2–14.5)

## 2011-04-25 LAB — COMPREHENSIVE METABOLIC PANEL
AST: 26 U/L (ref 0–37)
Alkaline Phosphatase: 53 U/L (ref 39–117)
BUN: 12 mg/dL (ref 6–23)
Creatinine, Ser: 0.8 mg/dL (ref 0.40–1.20)
Potassium: 3.8 mEq/L (ref 3.5–5.3)
Total Bilirubin: 1.1 mg/dL (ref 0.3–1.2)

## 2011-05-02 ENCOUNTER — Telehealth: Payer: Self-pay | Admitting: Family Medicine

## 2011-05-02 NOTE — Telephone Encounter (Signed)
Coughing a lot (dry cough) , nasal congestion. Nothing coming out of her nose. Advised saline washes and sudafed and to call back if it gets worse

## 2011-05-15 NOTE — Assessment & Plan Note (Signed)
NAME:  Darlene Howard, Darlene Howard                 CHART#:  [id number]   DATE:  10/12/2008                       DOB:  29-Jan-1951   REASON FOR CONSULTATION:  History of carcinoma in situ of the colon.   PHYSICIAN REQUESTING CONSULTATION:  Milus Mallick. [name], MD.   HISTORY OF PRESENT ILLNESS:  The patient is a very pleasant 60 year old  African American female who presents today in followup.  She has a  history of undergoing right hemicolectomy for colorectal cancer back in  April 2006.  Her colon cancer was discovered when she presented with  anemia.  She was found to have a pedunculated polyp in the ascending and  transverse colon and the one in the descending colon had a well-  differentiated adenocarcinoma of the colon with penetration of the  muscularis mucosa and present centrally within one stalk.  The ileocecal  valve appeared abnormal at time of endoscopy very hard unusual-appearing  lesion at the base.  She underwent a right hemicolectomy by Dr. Leonie Man.  Since that time, the patient was found to have multiple  myeloma.  She underwent a stem cell transplant in 2007, but has had some  recurrence and is maintained on chemotherapy.  She had her followup  surveillance colonoscopy by Dr. Jena Gauss in October 2007, which is normal  in the residual colon.  She has been recommended to have repeat  surveillance colonoscopy in October 2010.   The patient states she is not having any GI symptoms.  She does have  some chronic constipation and has managed very well with stool softeners  and senna-S.  She denies any blood in the stool or melena.  Denies any  abdominal pain.  No nausea or vomiting, heartburn, dysphagia, or  odynophagia.  She is followed by Dr. Dalene Carrow, oncologist.  She goes to  [facility] every 6 months for followup of her stem cell transplant.   CURRENT MEDICATIONS:  Potassium chloride 20 mEq 2 daily, Centrum Silver  daily, Diovan HCT 320/25 mg daily, lisinopril/hydrochlorothiazide  10/12.5 mg daily, aspirin 325 mg daily, calcium carbonate with vitamin D  t.i.d., Travatan eye drops nightly, loratadine 10 mg daily, pantoprazole  40 mg daily, lidocaine cream as directed, Tylenol 500 mg p.r.n., stool  softeners p.r.n., senna-S p.r.n., and Revlimid 25 mg for 21 days, off 1  week, then repeat for multiple myeloma.   ALLERGIES:  Codeine causes nausea.   PAST MEDICAL HISTORY:  Multiple myeloma as above, history of colon  cancer as above, GERD, hypertension, partial hysterectomy, stem cell  transplant, right hemicolectomy, and glaucoma.   FAMILY HISTORY:  Mother is deceased age 47, kidney failure.  Father  deceased age 72, prostate cancer.   SOCIAL HISTORY:  She is single.  She is employed.  She has never been a  smoker.  No alcohol use.   REVIEW OF SYSTEMS:  See HPI for GI.  Constitutional:  Denies any  unintentional weight loss.  Cardiopulmonary:  No chest pain, shortness  of breath, palpitations, or cough.  Genitourinary:  No dysuria or  hematuria.   PHYSICAL EXAMINATION:  VITAL SIGNS:  Weight 230, height 5 feet 6-1/2  inches, temperature 97.9, blood pressure 120/78, and pulse 88.  GENERAL:  Pleasant obese black female, in no acute distress.  SKIN:  Warm and dry.  No jaundice.  HEENT:  Sclerae nonicteric.  Oropharyngeal mucosa moist and pink.  No  lesions, erythema, or exudate.  No lymphadenopathy or thyromegaly.  CHEST:  Lungs are clear to auscultation.  CARDIAC:  Reveals regular rate and rhythm.  Normal S1 and S2.  No  murmurs, rubs, or gallops.  ABDOMEN:  Positive bowel sounds.  Abdomen is soft, nontender, and  nondistended.  No organomegaly or masses.  No rebound or guarding.  No  abdominal bruits or hernias.  LOWER EXTREMITIES:  No edema.   IMPRESSION:  The patient is a very pleasant 60 year old lady with  history of right hemicolectomy for colorectal cancer back in April 2006  with normal followup colonoscopy in October 2007.  She has a history of   multiple myeloma with ongoing chemotherapy.  She is doing well from  gastrointestinal standpoint.  She is due for surveillance colonoscopy in  October 2010.  We would go ahead and recommend yearly Hemoccult cards.  If any of these are positive, we would proceed with surveillance  colonoscopy plus diagnostic colonoscopy at that time.   PLAN:  1. Hemoccult stool x3.  2. At this point in time, we would recommend followup colonoscopy in      October 2010.   I would like to thank Dr. [name] for allowing [place] to  participate in the care of this patient.      Tana Coast, P.A.  Electronically Signed     R. Roetta Sessions, M.D.  Electronically Signed   LL/MEDQ  D:  10/12/2008  T:  10/12/2008  Job:  [id number]   cc:   Milus Mallick. [name], M.D.

## 2011-05-16 ENCOUNTER — Other Ambulatory Visit: Payer: Self-pay | Admitting: Hematology and Oncology

## 2011-05-16 ENCOUNTER — Encounter (HOSPITAL_BASED_OUTPATIENT_CLINIC_OR_DEPARTMENT_OTHER): Payer: 59 | Admitting: Hematology and Oncology

## 2011-05-16 DIAGNOSIS — C9000 Multiple myeloma not having achieved remission: Secondary | ICD-10-CM

## 2011-05-16 LAB — CBC WITH DIFFERENTIAL/PLATELET
BASO%: 0.3 % (ref 0.0–2.0)
EOS%: 2 % (ref 0.0–7.0)
HGB: 12.5 g/dL (ref 11.6–15.9)
MCH: 31.9 pg (ref 25.1–34.0)
MCHC: 33.8 g/dL (ref 31.5–36.0)
MCV: 94.4 fL (ref 79.5–101.0)
MONO%: 6.6 % (ref 0.0–14.0)
RBC: 3.92 10*6/uL (ref 3.70–5.45)
RDW: 13.5 % (ref 11.2–14.5)
lymph#: 1.7 10*3/uL (ref 0.9–3.3)

## 2011-05-18 NOTE — Op Note (Signed)
Darlene Howard, Darlene Howard                ACCOUNT NO.:  [id number]   MEDICAL RECORD NO.:  [id number]          PATIENT TYPE:  AMB   LOCATION:  DAY                           FACILITY:  [facility]   PHYSICIAN:  R. Roetta Sessions, M.D. DATE OF BIRTH:  05/14/1951   DATE OF PROCEDURE:  04/02/2005  DATE OF DISCHARGE:                                 OPERATIVE REPORT   PROCEDURE:  Esophagogastroduodenoscopy with snare polypectomy, followed by  colonoscopy with snare polypectomy and biopsy.   INDICATION FOR PROCEDURE:  The patient is a 60 year old lady referred out of  courtesy of Darlene Howard. Darlene Howard, M.D., for EGD and colonoscopy.  She  probably has iron-deficiency anemia to the point of requiring a two-unit  transfusion recently.  It is not known to me her Hemoccult status.  She has  not had any melena, hematochezia or hematemesis.  There is no family history  for GI neoplasia.  EGD and colonoscopy are now being done.  This approach  has been discussed with the patient at length, the potential risks,  benefits, and alternatives have been reviewed, questions answered.  She is  agreeable.  Please see documentation in the medical record for more  information.   PROCEDURE NOTE:  O2 saturation, blood pressure, pulse, and respiration were  monitored throughout the entirety of both procedures.   CONSCIOUS SEDATION:  Versed 3 mg IV, Demerol 75 mg in divided doses.   INSTRUMENT USED:  Olympus video chip system.   FINDINGS:  EGD:  Examination of the tubular esophagus revealed no mucosal  abnormality.  EG junction easily traversed.   Stomach:  The gastric cavity was empty and insufflated well with air.  A  thorough examination of the gastric mucosa including a retroflexed view of  the proximal stomach and esophagogastric junction demonstrated a 0.75 cm  pedunculated polyp with a central erosion versus ulceration in the body,  please see photos.  The remainder of the gastric mucosa appeared normal.  Pylorus  patent and easily traversed.  Examination of the bulb and the second  portion revealed no abnormalities.   Therapeutic/diagnostic maneuvers performed:  Utilizing snare cautery, this  polyp was removed totally in one pass and recovered through the scope.  The  patient tolerated the procedure well and was prepared for colonoscopy.   Digital rectal exam revealed no abnormalities.  Endoscopic findings:  Prep  was adequate.   Rectum:  Examination of the rectal mucosa including a retroflexed view of  the anal verge revealed no abnormalities.   Colon:  The colonic mucosa was surveyed from the rectosigmoid junction  through the left, transverse and right colon to the area of the appendiceal  orifice, ileocecal valve and cecum.  These structures were well-seen and  photographed for the record.  From this level the scope was slowly withdrawn  and all previously-mentioned mucosal surfaces were again seen.  The  following abnormalities were noted:   1.  Left-sided and transverse diverticula.  2.  A very hard, necrotic-appearing lesion at the base of the ileocecal      valve approximately 1 cm in dimensions,  please see photos.  It was not      apparent whether this was somehow an inverted necrotic tic or some other      process.  It was very hard, and I could not get a piece of it with the      cold biopsy forceps.  Even with a rat-tooth forceps, I got two small      pieces for the pathologist, please see photos.  3.  The patient had multiple adenomatous-appearing polyps, one in the mid-      ascending colon approximately 1.5 cm in dimensions, which was resected      totally with snare and recovered with a Roth net.  There was another 1      cm polyp in the transverse colon, which was removed with snare cautery      and recovered with a Roth net, and there was finally a 3 mm diminutive      polyp in the mid-descending colon, which was cold biopsied.   The remaining colonic mucosa appeared  normal.  The patient tolerated both  procedures well, was reacted in endoscopy.   IMPRESSION:  Esophagogastroduodenoscopy:  1.  Normal esophagus.  2.  Pedunculated polyp with central erosion versus ulceration in the body of      the stomach, resected and recovered.  3.  Remainder of the gastric mucosa, D1, D2 appeared normal.   Colonoscopy findings:  1.  Normal rectum.  2.  Left-sided diverticula.  3.  Very hard, unusual-appearing lesion at the base of the ileocecal valve,      biopsied as described above.  4.  Pedunculated polyps at the ascending and transverse colon, resected with      snare, submitted separately.  5.  Diminutive polyp, mid-descending colon, cold biopsied/removed.  6.  Remainder of the colonic mucosa appeared normal.   RECOMMENDATIONS:  1.  No aspirin or arthritis medications for 10 days.  2.  Follow up on pathology.  3.  We are going to proceed with a CT of the abdomen and pelvis to look at      her right colon.  4.  Will make further recommendations in the very near future.      RMR/MEDQ  D:  04/02/2005  T:  04/02/2005  Job:  [id number]   cc:   Darlene Howard. Darlene Howard, M.D.  [address]  [place], [place] [address]  Fax: [phone]

## 2011-05-18 NOTE — Discharge Summary (Signed)
NAMEDEJANAE, Darlene Howard NO.:  [id number]   MEDICAL RECORD NO.:  [id number]          PATIENT TYPE:  INP   LOCATION:  0450                         FACILITY:  [facility]   PHYSICIAN:  Leonie Man, M.D.   DATE OF BIRTH:  1951/01/06   DATE OF ADMISSION:  04/26/2005  DATE OF DISCHARGE:  05/02/2005                                 DISCHARGE SUMMARY   ADMISSION DIAGNOSIS:  Invasive carcinoma of the ascending colon.   DISCHARGE DIAGNOSIS:  Invasive carcinoma of the ascending colon.   PROCEDURE IN HOSPITAL:  Right hemicolectomy.   PATHOLOGIC FINDINGS:  No residual carcinoma at the biopsy site.  There were  0/16 nodes positive for tumor.   CONSULTATIONS:  None.   CONDITION ON DISCHARGE:  Improved.   FOLLOW UP:  Scheduled for 2 weeks postoperatively in the office.   HOME MEDICATIONS:  Percocet 1-2 every 4 hours p.r.n. pain.   ACTIVITY:  As tolerated.   DIET:  Unrestricted.   HOSPITAL COURSE:  The patient is a 60 year old female on routine colonoscopy  noted to have adenomatous polyps in the transverse colon and ascending colon  as well as a suspicious lesion in the ascending colon which on biopsy showed  an invasive carcinoma.  The patient was brought into the hospital after the  risks and potential benefits of right hemicolectomy were fully discussed  with her and she accepted these risks.  She was taken to the operating room  1 day following admission as she had come in for preoperative transfusion  for hemoglobin of 8.0.   On April 27, 2005, the patient was taken to the operating room where she  underwent uncomplicated right-sided hemicolectomy with primary anastomosis.  Her postoperative course has been benign with normal resumption of bowel  activity and diet.   Pathological findings are as noted above.  The patient is feeling generally  well and not having any untoward problems.  She has had normal bowel  movements and passage of flatus.  She is being  discharged now to be followed  up in the office in 2 weeks.   DISCHARGE PHYSICAL EXAMINATION:  VITAL SIGNS:  Normal.  CHEST:  Clear to auscultation.  HEART:  Regular rate and rhythm.  ABDOMEN:  Soft, nontender, nondistended.  The wound is healing well.  EXTREMITIES:  There is no calf tenderness or ankle edema.   LABORATORY DATA:  Hemoglobin has again drifted down to approximately 8.1.  She is not having any symptoms from this.  It is very possible that her  anemia is from a different source other than from the small tumor that was  removed originally.   PLAN:  Follow up with her in approximately 2 weeks.  She will from that  point also see a hematologist for workup of her anemia.      PB/MEDQ  D:  05/02/2005  T:  05/02/2005  Job:  [id number]   cc:   Milus Mallick. [name], M.D.  [address]  [place], [place] [address]  Fax: [phone]

## 2011-05-18 NOTE — Op Note (Signed)
[name], Darlene Howard NO.:  [id number]   MEDICAL RECORD NO.:  [id number]          PATIENT TYPE:  INP   LOCATION:  0450                         FACILITY:  [facility]   PHYSICIAN:  Leonie Man, M.D.   DATE OF BIRTH:  1951-08-23   DATE OF PROCEDURE:  04/27/2005  DATE OF DISCHARGE:                                 OPERATIVE REPORT   PREOPERATIVE DIAGNOSIS:  Carcinoma of the ascending colon.   POSTOPERATIVE DIAGNOSIS:  Carcinoma of the ascending colon.   PROCEDURE:  Right hemicolectomy.   SURGEON:  Leonie Man, M.D.   ASSISTANT:  Anselm Pancoast. Zachery Dakins, M.D.   ANESTHESIA:  General.   SPECIMENS FORWARDED TO PATHOLOGY:  Right colon and distal ileum.   ESTIMATED BLOOD LOSS:  100 cc.   OPERATIVE COMPLICATIONS:  None.  Patient returned to the PACU in excellent  condition.   NOTE:  The patient is a 60 year old female who underwent colonoscopic workup  for anemia and was noted to have a 1.5 cm tumor off the ascending colon  which on biopsy shows invasive adenocarcinoma.  The patient also had a mass  in the cecum, which on biopsy showed only vegetable material.   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  The patient comes to the operating  room now after the risks and potential benefits of surgery have been fully  discussed.  All questions answered, and consent obtained.   PROCEDURE:  Following the induction of satisfactory general anesthesia, the  patient was positioned supinely.  A Foley catheter is placed in the urinary  bladder, and the abdomen is prepped and draped to be included in a sterile  operative field.  A transverse incision is carried down on the lower right  side of the abdomen just in the infraumbilical area.  Taken through the skin  and subcutaneous tissue and carried down to the anterior rectus fascia.  The  anterior rectus fascia is divided.  Dissection carried down.   Retracting the  anterior rectus muscle medially, the posterior rectus fascia was also  divided.  The peritoneum was opened and entered.  On exploration of the  abdomen, there was no seating of the peritoneum.  The liver edges were sharp  with surfaces smooth.  There was no evidence of any gross metastatic  disease.   Dissection was started at the right lower quadrant and carried up along the  peritoneal reflection, freeing up the distal ileum and the ascending colon,  all the way up to the hepatic flexure.  The hepatic flexure was taken down  between clamps and secured with ties of 2-0 silk.  The duodenum and the  right ureter were sought and protected throughout the course of dissection.  The colon has been mobilized.  The distal ileum was transected with a GIA  stapler, as was the transverse colon just proximal to the middle colic  vessels.  The colonic mesentery was taken between clamps and secured with  ties of 2-0 silk all the way down to its origin at  the superior mesenteric  vessels.  The right colic vessels were doubly clamped and secured with 0  silk ties.  The specimen was then removed and forwarded for pathologic  evaluation.  We then affected a functional end-to-end anastomosis using a  GIA stapler and a TA 60 stapling device.  The resulting anastomosis was  noted to be intact.  The mesenteric defect was closed with interrupted  sutures of 3-0 silk.  The right upper quadrant was then thoroughly irrigated  with aliquots of normal saline.  Sponge, instruments and sharp counts were  verified.  The wound closed in layers as follows:  The posterior rectus  sheath and peritoneum closed with a running suture of #1 PDS.  The anterior  rectus sheath and lateral external oblique aponeurosis muscle closed with a  running suture of #1 TDS.  The subcutaneous tissues were irrigated.  The  skin closed with stainless steel staples.  Sterile dressings were applied to  the wound.  The  anesthetic reversed.  The patient removed from the operating  room to the recovery room in stable condition.  She tolerated the procedure  well.      PB/MEDQ  D:  04/27/2005  T:  04/27/2005  Job:  [id number]   cc:   Milus Mallick. [name], M.D.  [address]  [place], [place] [address]  Fax: [phone]

## 2011-05-18 NOTE — Op Note (Signed)
NAME:  Darlene Howard, GRUENEWALD                ACCOUNT NO.:  [id number]   MEDICAL RECORD NO.:  [id number]          PATIENT TYPE:  AMB   LOCATION:  DAY                           FACILITY:  [facility]   PHYSICIAN:  R. Roetta Sessions, M.D. DATE OF BIRTH:  1951-05-27   DATE OF PROCEDURE:  10/07/2006  DATE OF DISCHARGE:                                 OPERATIVE REPORT   PROCEDURE:  Surveillance colonoscopy.   INDICATIONS FOR PROCEDURE:  The patient is a 60 year old African-American  female who underwent a right hemicolectomy for colorectal cancer down in  [place] in April of 2006 by Dr. Leonie Man. She has seen Dr. Dalene Carrow  and has received chemotherapy. 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 She has done well. She is not having any lower  GI tract symptoms. She is here for surveillance. This approach has been  discussed with the patient at length. Potential risks, benefits, and  alternatives have been reviewed and questions answered. She is agreeable.  Please see documentation in the medical record.   PROCEDURE NOTE:  O2 saturation, blood pressure, pulse, and respirations were  monitored throughout the entire procedure. Conscious sedation with Versed  and Demerol in incremental doses.   INSTRUMENT:  Olympus video chip system.   FINDINGS:  Digital rectal exam revealed no abnormalities.   ENDOSCOPIC FINDINGS:  Prep was adequate.   Rectum:  Examination of the rectal mucosa including retroflexed view of the  anal verge revealed no abnormalities.   Colon:  Colonic mucosa was surveyed from the rectosigmoid junction through  the left and transverse colon to the anastomosis with the small bowel.  The  anastomosis appeared patent, and the residual colonic mucosa appeared normal  at this level. From this level, the scope was slowly and cautiously  withdrawn, and all previously mentioned  mucosal surfaces were again seen.  The residual colonic mucosa appeared entirely normal. There was no evidence  of polyp or neoplasm. The patient tolerated the procedure well and was  reactive to endoscopy.   IMPRESSION:  1. Normal rectum.  2. Status post right hemicolectomy. Normal residual colon.   RECOMMENDATIONS:  Repeat surveillance colonoscopy in 3 years.      Jonathon Bellows, M.D.  Electronically Signed     RMR/MEDQ  D:  10/07/2006  T:  10/08/2006  Job:  [id number]   cc:   Leonie Man, M.D.  [address]  [address]  [place]  [place] [address]   Milus Mallick. [name], M.D.  Fax: [phone]   Lauretta I. Odogwu, M.D.  Fax: [phone]

## 2011-05-18 NOTE — H&P (Signed)
Darlene Howard, FRAYNE NO.:  [id number]   MEDICAL RECORD NO.:  [id number]          PATIENT TYPE:  OBV   LOCATION:  A316                          FACILITY:  [facility]   PHYSICIAN:  Osvaldo Shipper, MD     DATE OF BIRTH:  1951/12/03   DATE OF ADMISSION:  03/02/2005  DATE OF DISCHARGE:  LH                                HISTORY & PHYSICAL   PRIMARY MEDICAL DOCTOR:  Darlene Howard. [name], M.D.   ADMITTING DIAGNOSES:  1.  Anemia.  2.  Hypertension.   CHIEF COMPLAINT:  Weakness and weight loss.   HISTORY OF PRESENT ILLNESS:  The patient is a 60 year old African American  female with a past medical history of hypertension who presented to her  primary medical doctor for the first time for a routine checkup.  The  patient gave a history of weakness and tiredness over the past 3-4 months.  She also mentions history of shortness of breath with activity and walking  short distances also over the past three months.  She used to get so tired  that she had to stop mid way between her work place and parking lot to take  a rest.  The patient also gives a history of weight loss of about 20 pounds  over the past one year.  She mentions that she has not been trying to loose  weight in any way.  She does not report any change in her appetite really.  The patient specifically denies any kind of nausea, vomiting, abdominal  pain, diarrhea.  She mentions occasional constipation but not on a regular  basis.  Her last bowel movement was yesterday evening which was brown in  color with no blood.  She also does not report any blood in her stools.  She  does not report any black colored stools.  The patient takes occasional  ibuprofen for her back pain but her use has never been more than 1-2 pills a  day.  The patient denies any heartburn.   MEDICATIONS:  1.  Lisinopril/hydrochlorothiazide 20/25 mg one p.o. daily.  2.  She was recently started on Benicar 10 mg p.o. daily.  3.  She takes  Centrum vitamins.  4.  She takes Caltrate.  5.  Premarin.  6.  Ibuprofen over the counter occasionally for back pain.   ALLERGIES:  No known drug allergies.   PAST MEDICAL HISTORY:  1.  Hypertension.  2.  Back pain for the past two years.  3.  Hysterectomy, unclear as to her ovaries are left behind or not.   SOCIAL HISTORY:  The patient lives alone.  She works for a tobacco company;  however, she does not smoke cigarettes and never has.  No history of alcohol  consumption, nor any history of any illicit drug use.   FAMILY HISTORY:  History of prostate cancer in her father.  Kidney failure  secondary to hypertension in her mother.  History of lung cancer in her  grandmother.  Specifically no history of any colon cancer in the family.   REVIEW OF SYSTEMS:  A  ten-point review of systems was done which revealed  occasional headaches, otherwise the remainder of the review of systems was  negative, except for back pain as mentioned under HPI.   PHYSICAL EXAMINATION:  VITAL SIGNS:  Her blood pressure was elevated at  156/90.  Heart rate was about 110 beats per minute.  She was afebrile.  Her  respiratory rate was about 14 breaths per minute.  She was saturating 100%  on room air.  GENERAL:  She is an obese African American female in no apparent distress.  HEENT:  There is pallor present.  No icterus is present.  Oral mucous  membranes are moist.  No oral cavity lesions are seen.  LUNGS:  Clear to auscultation bilaterally.  CARDIOVASCULAR:  S1 S2 are tachycardic.  Regular.  There is a systolic  murmur which is heard over the mitral area.  No S3 S4 is heard.  No JVD is  seen.  No carotid bruits are present.  ABDOMEN:  Completely soft, nontender, nondistended.  Bowel sounds are heard.  No organomegaly is appreciated.  RECTAL:  Elicited brown stool which was heme negative.  EXTREMITIES:  Without any edema and all peripheral pulses were palpable.  No  joint abnormalities were noted.   BACK:  Revealed no tenderness to palpation on any of the bony prominences.   LABS:  We just have a hemoglobin of 6.6 and a hematocrit of 20.4.  No other  lab data is available.  Her blood group is A positive.  Further lab results  will be available shortly.   IMPRESSION:  This is a 60 year old African American female with a history of  hypertension, who came into her primary medical doctor with complaints of  weight loss and weakness and fatigue over the past three to four months to a  year.  The patient in the primary medical doctor's office was found to be  anemic.  The plan was to give her blood transfusion in the specialty clinic,  however, intravenous access became an issue and the patient actually  received a PICC line in her right antecubital area by interventional  radiology.  The patient is being admitted under observation for blood  transfusion and further evaluation of this anemia.   PLAN:  1.  We will transfuse two units of blood for this patient over the course of      5-6 hours.  2.  Before the blood transfusion, we will check a CBC to look for MCV as      well as do an iron profile on this patient.  3.  We will also check a CMP to evaluate other causes of anemia.  4.  Depending on initial blood work, further recommendations will be made      including a possible GI endoscopy as an outpatient.  5.  The patient will most likely be discharged tomorrow after her blood is      transfused.      GK/MEDQ  D:  03/02/2005  T:  03/02/2005  Job:  [id number]   cc:   Darlene Howard. [name], M.D.  [address]  [place], [place] [address]  Fax: [phone]

## 2011-05-18 NOTE — Discharge Summary (Signed)
[name], Darlene Howard NO.:  [id number]   MEDICAL RECORD NO.:  [id number]          PATIENT TYPE:  OBV   LOCATION:  A316                          FACILITY:  [facility]   PHYSICIAN:  Osvaldo Shipper, MD     DATE OF BIRTH:  1951-12-19   DATE OF ADMISSION:  03/02/2005  DATE OF DISCHARGE:  03/04/2006LH                                 DISCHARGE SUMMARY   PRIMARY MEDICAL DOCTOR:  Dr. Milus Mallick. Howard.   DISCHARGE DIAGNOSES:  1.  Anemia of unknown etiology.  2.  Hypertension.   Please review the H&P dictated on March 3 for details regarding the  patient's presenting illness.   BRIEF HOSPITAL COURSE:  The patient is a 60 year old African American female  with a past medical history of hypertension who was seen at her primary  medical doctor's office for weakness and fatigue for the past few months,  and for weight loss over the past one year, and was found to be anemic.  Her  hemoglobin was 6.6.  The patient was sent for a blood transfusion.  However,  there was difficulty getting an IV access in the patient, so she required  PICC line placement under radiology guidance.  The patient had to be  admitted for observation because the PICC line could not be placed later in  the evening.  The patient remained stable during the course of this  admission.  She was transfused two units of packed red blood cells without  any incident.  Her hemoglobin responded to come up to 10.5.  The patient  remained asymptomatic.  Her weakness improved over the course of this  admission.  I have contacted Dr. Jena Howard to schedule an outpatient colonoscopy  for this patient.  Dr. Jena Howard will contact the patient on Monday and schedule  the same.  I have informed this to the patient, and she will be expecting a  call from Dr. Luvenia Howard office.   Please note at this time, the following labs are still pending to evaluate  this anemia. This includes an iron profile.  On her comprehensive metabolic  profile, it was seen that her total protein was 9.4, and her albumin was  2.9.  In view of this, I have sent serum protein electrophoresis which is  also pending at this time.  The patient also was complaining of back pain  which has been present for about two years.  I have ordered an x-ray of the  lower back to look for any bony lesions.  All of the results of these tests  will be communicated to Dr. Syliva Howard who is her PMD.   DISCHARGE MEDICATIONS:  The patient will resume all of her pre  hospitalization medications.   DISCHARGE DIET:  Low-salt, low cholesterol diet.   DISCHARGE ACTIVITY:  As before.   FOLLOWUP CARE:  1.  With Dr. Syliva Howard as scheduled by Darlene Howard office.  2.  Dr. Jena Howard will call the patient to schedule an outpatient colonoscopy      and/or an EGD.   Please note that the PICC line  was removed by myself after the patient  completed blood transfusion.  There were no complications noted.      GK/MEDQ  D:  03/03/2005  T:  03/03/2005  Job:  [id number]   cc:   Darlene Howard, M.D.  [address]  [place], [place] [address]  Fax: [phone]   R. Roetta Sessions, M.D.  [address]  [place]  [place] [address]

## 2011-06-01 ENCOUNTER — Other Ambulatory Visit: Payer: Self-pay | Admitting: Family Medicine

## 2011-06-06 ENCOUNTER — Other Ambulatory Visit: Payer: Self-pay | Admitting: Hematology and Oncology

## 2011-06-06 ENCOUNTER — Encounter (HOSPITAL_BASED_OUTPATIENT_CLINIC_OR_DEPARTMENT_OTHER): Payer: 59 | Admitting: Hematology and Oncology

## 2011-06-06 ENCOUNTER — Other Ambulatory Visit: Payer: Self-pay | Admitting: Family Medicine

## 2011-06-06 DIAGNOSIS — C9000 Multiple myeloma not having achieved remission: Secondary | ICD-10-CM

## 2011-06-06 LAB — CBC WITH DIFFERENTIAL/PLATELET
BASO%: 0.4 % (ref 0.0–2.0)
Eosinophils Absolute: 0.1 10*3/uL (ref 0.0–0.5)
LYMPH%: 12.8 % — ABNORMAL LOW (ref 14.0–49.7)
MCHC: 33.2 g/dL (ref 31.5–36.0)
MONO#: 0.8 10*3/uL (ref 0.1–0.9)
MONO%: 8.8 % (ref 0.0–14.0)
NEUT#: 7.1 10*3/uL — ABNORMAL HIGH (ref 1.5–6.5)
Platelets: 115 10*3/uL — ABNORMAL LOW (ref 145–400)
RBC: 3.73 10*6/uL (ref 3.70–5.45)
RDW: 15.3 % — ABNORMAL HIGH (ref 11.2–14.5)
WBC: 9.2 10*3/uL (ref 3.9–10.3)

## 2011-06-06 LAB — COMPREHENSIVE METABOLIC PANEL
ALT: 47 U/L — ABNORMAL HIGH (ref 0–35)
Albumin: 3.5 g/dL (ref 3.5–5.2)
CO2: 28 mEq/L (ref 19–32)
Calcium: 9.9 mg/dL (ref 8.4–10.5)
Chloride: 103 mEq/L (ref 96–112)
Creatinine, Ser: 0.76 mg/dL (ref 0.50–1.10)
Sodium: 139 mEq/L (ref 135–145)
Total Protein: 6.5 g/dL (ref 6.0–8.3)

## 2011-06-18 ENCOUNTER — Other Ambulatory Visit: Payer: Self-pay | Admitting: Family Medicine

## 2011-06-19 LAB — HEMOGLOBIN A1C: Hgb A1c MFr Bld: 7.1 % — ABNORMAL HIGH (ref <5.7)

## 2011-06-20 ENCOUNTER — Other Ambulatory Visit: Payer: Self-pay | Admitting: Family Medicine

## 2011-06-20 DIAGNOSIS — Z139 Encounter for screening, unspecified: Secondary | ICD-10-CM

## 2011-06-21 ENCOUNTER — Ambulatory Visit
Admission: RE | Admit: 2011-06-21 | Discharge: 2011-06-21 | Disposition: A | Discharge status: Home / Self Care | Payer: 59 | Source: Ambulatory Visit | Attending: Family Medicine | Admitting: Family Medicine

## 2011-06-26 ENCOUNTER — Encounter: Payer: Self-pay | Admitting: Family Medicine

## 2011-06-26 ENCOUNTER — Encounter (HOSPITAL_BASED_OUTPATIENT_CLINIC_OR_DEPARTMENT_OTHER): Payer: 59 | Admitting: Hematology and Oncology

## 2011-06-26 ENCOUNTER — Other Ambulatory Visit: Payer: Self-pay | Admitting: Hematology and Oncology

## 2011-06-26 DIAGNOSIS — C9002 Multiple myeloma in relapse: Secondary | ICD-10-CM

## 2011-06-26 DIAGNOSIS — C9000 Multiple myeloma not having achieved remission: Secondary | ICD-10-CM

## 2011-06-26 LAB — COMPREHENSIVE METABOLIC PANEL
AST: 20 U/L (ref 0–37)
Albumin: 4.2 g/dL (ref 3.5–5.2)
Alkaline Phosphatase: 52 U/L (ref 39–117)
Potassium: 3.5 mEq/L (ref 3.5–5.3)
Sodium: 142 mEq/L (ref 135–145)
Total Bilirubin: 1.2 mg/dL (ref 0.3–1.2)
Total Protein: 6.1 g/dL (ref 6.0–8.3)

## 2011-06-26 LAB — CBC WITH DIFFERENTIAL/PLATELET
EOS%: 1.3 % (ref 0.0–7.0)
MCH: 33.5 pg (ref 25.1–34.0)
MCHC: 34.2 g/dL (ref 31.5–36.0)
MCV: 97.9 fL (ref 79.5–101.0)
MONO%: 7.3 % (ref 0.0–14.0)
RBC: 3.61 10*6/uL — ABNORMAL LOW (ref 3.70–5.45)
RDW: 15.1 % — ABNORMAL HIGH (ref 11.2–14.5)

## 2011-06-27 ENCOUNTER — Ambulatory Visit (INDEPENDENT_AMBULATORY_CARE_PROVIDER_SITE_OTHER): Payer: 59 | Admitting: Family Medicine

## 2011-06-27 ENCOUNTER — Encounter: Payer: Self-pay | Admitting: Family Medicine

## 2011-06-27 VITALS — BP 140/90 | HR 85 | Resp 16 | Ht 65.5 in | Wt 227.8 lb

## 2011-06-27 DIAGNOSIS — E119 Type 2 diabetes mellitus without complications: Secondary | ICD-10-CM

## 2011-06-27 DIAGNOSIS — Z23 Encounter for immunization: Secondary | ICD-10-CM

## 2011-06-27 DIAGNOSIS — I10 Essential (primary) hypertension: Secondary | ICD-10-CM

## 2011-06-27 DIAGNOSIS — J309 Allergic rhinitis, unspecified: Secondary | ICD-10-CM

## 2011-06-27 DIAGNOSIS — E669 Obesity, unspecified: Secondary | ICD-10-CM

## 2011-06-27 MED ORDER — AMLODIPINE BESYLATE 2.5 MG PO TABS: 2.5000 mg | ORAL_TABLET | Freq: Every day | ORAL | Status: DC

## 2011-06-27 MED ORDER — LORATADINE 10 MG PO TABS: 10.0000 mg | ORAL_TABLET | Freq: Every day | ORAL | Status: DC

## 2011-06-27 MED ORDER — ALENDRONATE SODIUM 70 MG PO TABS: 70.0000 mg | ORAL_TABLET | ORAL | Status: DC

## 2011-06-27 MED ORDER — METFORMIN HCL 1000 MG PO TABS: 1000.0000 mg | ORAL_TABLET | Freq: Two times a day (BID) | ORAL | Status: DC

## 2011-06-27 MED ORDER — VALSARTAN-HYDROCHLOROTHIAZIDE 320-25 MG PO TABS: 1.0000 | ORAL_TABLET | Freq: Every day | ORAL | Status: DC

## 2011-06-27 MED ORDER — PANTOPRAZOLE SODIUM 40 MG PO TBEC: 40.0000 mg | DELAYED_RELEASE_TABLET | Freq: Every day | ORAL | Status: DC

## 2011-06-27 MED ORDER — POTASSIUM CHLORIDE CRYS ER 20 MEQ PO TBCR: 20.0000 meq | EXTENDED_RELEASE_TABLET | Freq: Two times a day (BID) | ORAL | Status: DC

## 2011-06-27 NOTE — Patient Instructions (Signed)
F/u in 4 months   Fasting labs inn 4 months  Lipid, , cmp and EGR, HBA1C in 4 months  It is important that you exercise regularly at least 30 minutes 5 times a week. If you develop chest pain, have severe difficulty breathing, or feel very tired, stop exercising immediately and seek medical attention   A healthy diet is rich in fruit, vegetables and whole grains. Poultry fish, nuts and beans are a healthy choice for protein rather then red meat. A low sodium diet and drinking 64 ounces of water daily is generally recommended. Oils and sweet should be limited. Carbohydrates especially for those who are diabetic or overweight, should be limited to 34-45 gram per meal. It is important to eat on a regular schedule, at least 3 times daily. Snacks should be primarily fruits, vegetables or nuts.   Dose increase in metformin start 500mg  TWO twice daily, , the new script is for 1000mg  ONE twice daily  TdAp today

## 2011-07-01 ENCOUNTER — Encounter: Payer: Self-pay | Admitting: Family Medicine

## 2011-07-01 NOTE — Assessment & Plan Note (Signed)
Improved. Pt applauded on succesful weight loss through lifestyle change, and encouraged to continue same. Weight loss goal set for the next several months.  

## 2011-07-01 NOTE — Assessment & Plan Note (Signed)
Controlled, no change in medication  

## 2011-07-01 NOTE — Assessment & Plan Note (Signed)
Uncontrolled, additionall med started

## 2011-07-01 NOTE — Progress Notes (Signed)
  Subjective:    Patient ID: Darlene Howard, female    DOB: [birth year], 60 y.o.   MRN: [id number]  HPI The PT is here for follow up and re-evaluation of chronic medical conditions, medication management and review of recent lab and radiology data.  Preventive health is updated, specifically  Cancer screening, Osteoporosis screening and Immunization.   Questions or concerns regarding consultations or procedures which the PT has had in the interim are  addressed. The PT denies any adverse reactions to current medications since the last visit.  There are no new concerns.  There are no specific complaints       Review of Systems Denies recent fever or chills. Denies sinus pressure, nasal congestion, ear pain or sore throat. Denies chest congestion, productive cough or wheezing. Denies chest pains, palpitations, paroxysmal nocturnal dyspnea, orthopnea and leg swelling Denies abdominal pain, nausea, vomiting,diarrhea or constipation.  Denies rectal bleeding or change in bowel movement. Denies dysuria, frequency, hesitancy or incontinence. Denies joint pain, swelling and limitation in mobility. Denies headaches, seizure, numbness, or tingling. Denies depression, anxiety or insomnia. Denies skin break down or rash.        Objective:   Physical Exam Patient alert and oriented and in no Cardiopulmonary distress.  HEENT: No facial asymmetry, EOMI, no sinus tenderness, TM's clear, Oropharynx pink and moist.  Neck supple no adenopathy.  Chest: Clear to auscultation bilaterally.  CVS: S1, S2 no murmurs, no S3.  ABD: Soft non tender. Bowel sounds normal.  Ext: No edema  MS: Adequate ROM spine, shoulders, hips and knees.  Skin: Intact, no ulcerations or rash noted.  Psych: Good eye contact, normal affect. Memory intact not anxious or depressed appearing.  CNS: CN 2-12 intact, power, tone and sensation normal throughout.        Assessment & Plan:

## 2011-07-01 NOTE — Assessment & Plan Note (Signed)
Controlled, no change in medication Encouraged pt to further modify lifestyle to improve blood sugars

## 2011-07-10 ENCOUNTER — Encounter: Payer: Self-pay | Admitting: Family Medicine

## 2011-07-10 NOTE — Consult Note (Signed)
  NAMEMALEKA, CONTINO NO.:  [id number]  MEDICAL RECORD NO.:  [id number]          PATIENT TYPE:  OUT  LOCATION:  GYN                          FACILITY:  [facility]  PHYSICIAN:  Laurette Schimke, MD     DATE OF BIRTH:  02/15/1951  DATE OF CONSULTATION:  03/15/2011 DATE OF DISCHARGE:                                CONSULTATION   HISTORY OF PRESENT ILLNESS:  This is a 60 year old with IgA multiple myeloma currently under the care of Dr. Darrold Span with Revlimid.  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  On February 15, 2010, she underwent CO2 laser ablation of extensive vulva, perianal and vaginal dysplasia.  Postoperatively she did well and was advised to use Premarin cream.  A Pap test in June 2011 returned with low-grade squamous intraepithelial lesion.  Colposcopy was performed and biopsies were performed in September 2011 and were consistent with VAIN I.  Ms. Gains has been advised to continue the use of the Premarin vaginal cream andthe plan is for close surveillance.  She has been advised that while her IgA kappa multiple myeloma is well controlled with minimal use of Revlimid and Decadron, it is less likely for her to have a normal Pap test.  PAST MEDICAL HISTORY:  No interval changes.  PAST SURGICAL HISTORY:  No interval changes.  REVIEW OF SYSTEMS:  A 10-point review of systems was notable for no weight changes.  No nausea, vomiting, abdominal pain, vaginal bleeding or vaginal discharge.  PHYSICAL EXAMINATION:  GENERAL:  A well-developed female in no acute distress. PELVIC:  Normal external genitalia, Bartholin's, urethra and Skene's. No lesions noted on the external genitalia.  The vagina is mildly atrophic.  No lesions were visible.  Pap test was collected.  Cervix was within normal limits, smooth, no parametrial nodularity was appreciated. No cul-de-sac masses. RECTAL:  Examination was deferred.  IMPRESSION:  Low-grade squamous intraepithelial  lesion.  PLAN:  Repeat Pap was collected today.  If this returns low-grade SIL or better, Ms. Strickland will be evaluated in a year.  If the dysplasia is greater than low-grade SIL, this will warrant additional evaluation.     Laurette Schimke, MD     WB/MEDQ  D:  03/15/2011  T:  03/15/2011  Job:  [id number]  cc:   Milus Mallick. [name], M.D. Fax: [phone]  Carrington Clamp, M.D. Fax: [phone]  Lauretta I. Odogwu, M.D. Fax: [phone]  Telford Nab, R.N. [address] [place], [place] [address]  Electronically Signed by Laurette Schimke MD on 03/19/2011 10:23:04 AM

## 2011-07-13 ENCOUNTER — Ambulatory Visit (HOSPITAL_COMMUNITY)
Admission: RE | Admit: 2011-07-13 | Discharge: 2011-07-13 | Disposition: A | Discharge status: Home / Self Care | Payer: 59 | Source: Ambulatory Visit | Attending: Family Medicine | Admitting: Family Medicine

## 2011-07-13 DIAGNOSIS — Z1231 Encounter for screening mammogram for malignant neoplasm of breast: Secondary | ICD-10-CM | POA: Insufficient documentation

## 2011-07-13 DIAGNOSIS — Z139 Encounter for screening, unspecified: Secondary | ICD-10-CM

## 2011-07-18 ENCOUNTER — Other Ambulatory Visit: Payer: Self-pay | Admitting: Hematology and Oncology

## 2011-07-18 ENCOUNTER — Encounter (HOSPITAL_BASED_OUTPATIENT_CLINIC_OR_DEPARTMENT_OTHER): Payer: 59 | Admitting: Hematology and Oncology

## 2011-07-18 DIAGNOSIS — C9000 Multiple myeloma not having achieved remission: Secondary | ICD-10-CM

## 2011-07-18 LAB — CBC WITH DIFFERENTIAL/PLATELET
Basophils Absolute: 0 10*3/uL (ref 0.0–0.1)
EOS%: 1.8 % (ref 0.0–7.0)
HCT: 36.7 % (ref 34.8–46.6)
HGB: 12.4 g/dL (ref 11.6–15.9)
MCH: 33.2 pg (ref 25.1–34.0)
MCV: 98.5 fL (ref 79.5–101.0)
MONO%: 8 % (ref 0.0–14.0)
NEUT%: 79.5 % — ABNORMAL HIGH (ref 38.4–76.8)

## 2011-07-20 LAB — KAPPA/LAMBDA LIGHT CHAINS: Kappa:Lambda Ratio: 0.22 — ABNORMAL LOW (ref 0.26–1.65)

## 2011-07-20 LAB — PROTEIN ELECTROPHORESIS, SERUM
Albumin ELP: 62.1 % (ref 55.8–66.1)
Alpha-1-Globulin: 4.4 % (ref 2.9–4.9)
Alpha-2-Globulin: 11.4 % (ref 7.1–11.8)
Beta 2: 8.1 % — ABNORMAL HIGH (ref 3.2–6.5)
Beta Globulin: 7.3 % — ABNORMAL HIGH (ref 4.7–7.2)

## 2011-07-20 LAB — COMPREHENSIVE METABOLIC PANEL
AST: 25 U/L (ref 0–37)
Alkaline Phosphatase: 53 U/L (ref 39–117)
BUN: 13 mg/dL (ref 6–23)
Glucose, Bld: 153 mg/dL — ABNORMAL HIGH (ref 70–99)
Total Bilirubin: 0.9 mg/dL (ref 0.3–1.2)

## 2011-07-23 ENCOUNTER — Telehealth: Payer: Self-pay | Admitting: Family Medicine

## 2011-07-23 DIAGNOSIS — I10 Essential (primary) hypertension: Secondary | ICD-10-CM

## 2011-07-23 NOTE — Telephone Encounter (Signed)
Pt to pick up non fasting chem7  To be drawn tomorrow afternoon, pls order and leave form fo her to collect with front desk staff, dx htn and low potassium  She is to be called wednesday afternoon , cell # is fine to let her know how to take her meds, this weekend has been up to 3 potassium tabs daily from 2 per her oncologist.  If normal tomorrow she is to go back to two daily

## 2011-07-24 LAB — BASIC METABOLIC PANEL
CO2: 27 mEq/L (ref 19–32)
Calcium: 10 mg/dL (ref 8.4–10.5)
Chloride: 101 mEq/L (ref 96–112)
Potassium: 4 mEq/L (ref 3.5–5.3)
Sodium: 140 mEq/L (ref 135–145)

## 2011-07-24 NOTE — Telephone Encounter (Signed)
Lab ordered.

## 2011-08-08 ENCOUNTER — Other Ambulatory Visit: Payer: Self-pay | Admitting: Hematology and Oncology

## 2011-08-08 ENCOUNTER — Encounter (HOSPITAL_BASED_OUTPATIENT_CLINIC_OR_DEPARTMENT_OTHER): Payer: 59 | Admitting: Hematology and Oncology

## 2011-08-08 DIAGNOSIS — C9000 Multiple myeloma not having achieved remission: Secondary | ICD-10-CM

## 2011-08-08 LAB — CBC WITH DIFFERENTIAL/PLATELET
Basophils Absolute: 0 10*3/uL (ref 0.0–0.1)
EOS%: 2.2 % (ref 0.0–7.0)
HCT: 35.4 % (ref 34.8–46.6)
HGB: 12 g/dL (ref 11.6–15.9)
MCH: 33.1 pg (ref 25.1–34.0)
MCV: 98 fL (ref 79.5–101.0)
NEUT%: 77.2 % — ABNORMAL HIGH (ref 38.4–76.8)
Platelets: 121 10*3/uL — ABNORMAL LOW (ref 145–400)
lymph#: 0.9 10*3/uL (ref 0.9–3.3)

## 2011-08-09 LAB — COMPREHENSIVE METABOLIC PANEL
ALT: 40 U/L — ABNORMAL HIGH (ref 0–35)
CO2: 35 mEq/L — ABNORMAL HIGH (ref 19–32)
Calcium: 9.9 mg/dL (ref 8.4–10.5)
Chloride: 106 mEq/L (ref 96–112)
Sodium: 143 mEq/L (ref 135–145)
Total Protein: 6.1 g/dL (ref 6.0–8.3)

## 2011-08-29 ENCOUNTER — Other Ambulatory Visit: Payer: Self-pay | Admitting: Hematology and Oncology

## 2011-08-29 ENCOUNTER — Encounter (HOSPITAL_BASED_OUTPATIENT_CLINIC_OR_DEPARTMENT_OTHER): Payer: 59 | Admitting: Hematology and Oncology

## 2011-08-29 DIAGNOSIS — C9000 Multiple myeloma not having achieved remission: Secondary | ICD-10-CM

## 2011-08-29 DIAGNOSIS — R197 Diarrhea, unspecified: Secondary | ICD-10-CM

## 2011-08-29 LAB — CBC WITH DIFFERENTIAL/PLATELET
BASO%: 0.2 % (ref 0.0–2.0)
EOS%: 1.7 % (ref 0.0–7.0)
HCT: 38 % (ref 34.8–46.6)
LYMPH%: 12.8 % — ABNORMAL LOW (ref 14.0–49.7)
MCH: 32.7 pg (ref 25.1–34.0)
MCHC: 33.6 g/dL (ref 31.5–36.0)
MONO%: 9.8 % (ref 0.0–14.0)
NEUT%: 75.5 % (ref 38.4–76.8)
Platelets: 124 10*3/uL — ABNORMAL LOW (ref 145–400)
RBC: 3.91 10*6/uL (ref 3.70–5.45)

## 2011-08-29 LAB — COMPREHENSIVE METABOLIC PANEL
ALT: 36 U/L — ABNORMAL HIGH (ref 0–35)
AST: 21 U/L (ref 0–37)
Alkaline Phosphatase: 53 U/L (ref 39–117)
Creatinine, Ser: 0.86 mg/dL (ref 0.50–1.10)
Total Bilirubin: 1.2 mg/dL (ref 0.3–1.2)

## 2011-09-05 ENCOUNTER — Other Ambulatory Visit: Payer: Self-pay | Admitting: Family Medicine

## 2011-09-20 ENCOUNTER — Other Ambulatory Visit: Payer: Self-pay | Admitting: Hematology and Oncology

## 2011-09-20 ENCOUNTER — Encounter (HOSPITAL_BASED_OUTPATIENT_CLINIC_OR_DEPARTMENT_OTHER): Payer: 59 | Admitting: Hematology and Oncology

## 2011-09-20 DIAGNOSIS — C9000 Multiple myeloma not having achieved remission: Secondary | ICD-10-CM

## 2011-09-20 LAB — CBC WITH DIFFERENTIAL/PLATELET
BASO%: 0.2 % (ref 0.0–2.0)
HCT: 37 % (ref 34.8–46.6)
LYMPH%: 14.2 % (ref 14.0–49.7)
MCH: 33 pg (ref 25.1–34.0)
MCHC: 33.8 g/dL (ref 31.5–36.0)
MCV: 97.7 fL (ref 79.5–101.0)
MONO#: 1 10*3/uL — ABNORMAL HIGH (ref 0.1–0.9)
NEUT%: 72.2 % (ref 38.4–76.8)
Platelets: 118 10*3/uL — ABNORMAL LOW (ref 145–400)
WBC: 7.7 10*3/uL (ref 3.9–10.3)

## 2011-09-20 LAB — COMPREHENSIVE METABOLIC PANEL
Albumin: 4.1 g/dL (ref 3.5–5.2)
BUN: 9 mg/dL (ref 6–23)
CO2: 25 mEq/L (ref 19–32)
Glucose, Bld: 149 mg/dL — ABNORMAL HIGH (ref 70–99)
Potassium: 3.8 mEq/L (ref 3.5–5.3)
Sodium: 140 mEq/L (ref 135–145)
Total Protein: 6.1 g/dL (ref 6.0–8.3)

## 2011-10-01 ENCOUNTER — Other Ambulatory Visit: Payer: Self-pay | Admitting: Hematology and Oncology

## 2011-10-01 ENCOUNTER — Encounter (HOSPITAL_BASED_OUTPATIENT_CLINIC_OR_DEPARTMENT_OTHER): Payer: 59 | Admitting: Hematology and Oncology

## 2011-10-01 DIAGNOSIS — C9000 Multiple myeloma not having achieved remission: Secondary | ICD-10-CM

## 2011-10-01 LAB — CBC WITH DIFFERENTIAL/PLATELET
Basophils Absolute: 0 10*3/uL (ref 0.0–0.1)
Eosinophils Absolute: 0.1 10*3/uL (ref 0.0–0.5)
HGB: 12.7 g/dL (ref 11.6–15.9)
LYMPH%: 13.7 % — ABNORMAL LOW (ref 14.0–49.7)
MCV: 98.7 fL (ref 79.5–101.0)
MONO#: 1 10*3/uL — ABNORMAL HIGH (ref 0.1–0.9)
MONO%: 11.1 % (ref 0.0–14.0)
NEUT#: 6.4 10*3/uL (ref 1.5–6.5)
Platelets: 146 10*3/uL (ref 145–400)
RBC: 3.88 10*6/uL (ref 3.70–5.45)
RDW: 15.3 % — ABNORMAL HIGH (ref 11.2–14.5)
WBC: 8.7 10*3/uL (ref 3.9–10.3)

## 2011-10-01 LAB — CBC
Hemoglobin: 12
RBC: 3.62 — ABNORMAL LOW
WBC: 5.9

## 2011-10-01 LAB — TISSUE HYBRIDIZATION (BONE MARROW)-NCBH

## 2011-10-01 LAB — BONE MARROW EXAM

## 2011-10-01 LAB — CHROMOSOME ANALYSIS, BONE MARROW

## 2011-10-03 LAB — SPEP & IFE WITH QIG
Alpha-2-Globulin: 11.6 % (ref 7.1–11.8)
Gamma Globulin: 6.4 % — ABNORMAL LOW (ref 11.1–18.8)
IgG (Immunoglobin G), Serum: 419 mg/dL — ABNORMAL LOW (ref 690–1700)
IgM, Serum: 17 mg/dL — ABNORMAL LOW (ref 52–322)
M-Spike, %: 0.21 g/dL
Total Protein, Serum Electrophoresis: 6.1 g/dL (ref 6.0–8.3)

## 2011-10-03 LAB — COMPREHENSIVE METABOLIC PANEL
ALT: 34 U/L (ref 0–35)
AST: 22 U/L (ref 0–37)
CO2: 27 mEq/L (ref 19–32)
Chloride: 102 mEq/L (ref 96–112)
Sodium: 139 mEq/L (ref 135–145)
Total Bilirubin: 1.1 mg/dL (ref 0.3–1.2)
Total Protein: 6.1 g/dL (ref 6.0–8.3)

## 2011-10-03 LAB — KAPPA/LAMBDA LIGHT CHAINS: Kappa free light chain: 0.85 mg/dL (ref 0.33–1.94)

## 2011-10-05 ENCOUNTER — Ambulatory Visit (INDEPENDENT_AMBULATORY_CARE_PROVIDER_SITE_OTHER): Payer: 59

## 2011-10-05 DIAGNOSIS — Z23 Encounter for immunization: Secondary | ICD-10-CM

## 2011-10-10 ENCOUNTER — Encounter (HOSPITAL_BASED_OUTPATIENT_CLINIC_OR_DEPARTMENT_OTHER): Payer: 59 | Admitting: Hematology and Oncology

## 2011-10-10 DIAGNOSIS — C9000 Multiple myeloma not having achieved remission: Secondary | ICD-10-CM

## 2011-10-10 LAB — CBC
MCV: 99.2
Platelets: 149 — ABNORMAL LOW
RBC: 3.06 — ABNORMAL LOW
WBC: 8.9

## 2011-10-11 LAB — TISSUE HYBRIDIZATION (BONE MARROW)-NCBH

## 2011-10-11 LAB — BONE MARROW EXAM

## 2011-10-11 LAB — CHROMOSOME ANALYSIS, BONE MARROW

## 2011-10-23 LAB — COMPLETE METABOLIC PANEL WITH GFR
ALT: 39 U/L — ABNORMAL HIGH (ref 0–35)
AST: 23 U/L (ref 0–37)
CO2: 29 mEq/L (ref 19–32)
Chloride: 105 mEq/L (ref 96–112)
Creat: 0.84 mg/dL (ref 0.50–1.10)
GFR, Est African American: 86 mL/min — ABNORMAL LOW (ref >90)
Sodium: 142 mEq/L (ref 135–145)
Total Bilirubin: 1 mg/dL (ref 0.3–1.2)
Total Protein: 6.3 g/dL (ref 6.0–8.3)

## 2011-10-23 LAB — LIPID PANEL
Cholesterol: 152 mg/dL (ref 0–200)
HDL: 55 mg/dL
LDL Cholesterol: 73 mg/dL (ref 0–99)
Total CHOL/HDL Ratio: 2.8 ratio
Triglycerides: 120 mg/dL
VLDL: 24 mg/dL (ref 0–40)

## 2011-10-23 LAB — HEMOGLOBIN A1C
Hgb A1c MFr Bld: 6.1 % — ABNORMAL HIGH
Mean Plasma Glucose: 128 mg/dL — ABNORMAL HIGH

## 2011-10-26 ENCOUNTER — Encounter: Payer: Self-pay | Admitting: Family Medicine

## 2011-10-26 ENCOUNTER — Encounter: Payer: Self-pay | Admitting: Nurse Practitioner

## 2011-10-29 ENCOUNTER — Encounter: Payer: Self-pay | Admitting: Family Medicine

## 2011-10-29 ENCOUNTER — Ambulatory Visit (INDEPENDENT_AMBULATORY_CARE_PROVIDER_SITE_OTHER): Payer: 59 | Admitting: Family Medicine

## 2011-10-29 VITALS — BP 130/78 | HR 92 | Resp 16 | Ht 65.5 in | Wt 223.8 lb

## 2011-10-29 DIAGNOSIS — E669 Obesity, unspecified: Secondary | ICD-10-CM

## 2011-10-29 DIAGNOSIS — E785 Hyperlipidemia, unspecified: Secondary | ICD-10-CM

## 2011-10-29 DIAGNOSIS — E119 Type 2 diabetes mellitus without complications: Secondary | ICD-10-CM

## 2011-10-29 DIAGNOSIS — I10 Essential (primary) hypertension: Secondary | ICD-10-CM

## 2011-10-29 NOTE — Patient Instructions (Addendum)
F/u in 4 months.  It is important that you exercise regularly at least 30 minutes 5 times a week. If you develop chest pain, have severe difficulty breathing, or feel very tired, stop exercising immediately and seek medical attention   A healthy diet is rich in fruit, vegetables and whole grains. Poultry fish, nuts and beans are a healthy choice for protein rather then red meat. A low sodium diet and drinking 64 ounces of water daily is generally recommended. Oils and sweet should be limited. Carbohydrates especially for those who are diabetic or overweight, should be limited to 30-45 gram per meal. It is important to eat on a regular schedule, at least 3 times daily. Snacks should be primarily fruits, vegetables or nuts.   hBA1C and chem 7 inn 4 months.  Congrats your blood sugar is much better, cholesterol is excellent, blood pressure controlled.  Weight loss goal of 6 pounds

## 2011-10-30 ENCOUNTER — Other Ambulatory Visit: Payer: Self-pay | Admitting: Family Medicine

## 2011-10-30 NOTE — Assessment & Plan Note (Signed)
Improved and controlled  

## 2011-10-30 NOTE — Assessment & Plan Note (Signed)
Controlled, no change in medication  

## 2011-10-30 NOTE — Assessment & Plan Note (Signed)
Improved. Pt applauded on succesful weight loss through lifestyle change, and encouraged to continue same. Weight loss goal set for the next several months.  

## 2011-10-30 NOTE — Progress Notes (Signed)
  Subjective:    Patient ID: Darlene Howard, female    DOB: 1951/05/27, 60 y.o.   MRN: [id number]  HPI The PT is here for follow up and re-evaluation of chronic medical conditions, medication management and review of any available recent lab and radiology data.  Preventive health is updated, specifically  Cancer screening and Immunization.   Questions or concerns regarding consultations or procedures which the PT has had in the interim are  addressed. The PT denies any adverse reactions to current medications since the last visit.  There are no new concerns.  There are no specific complaints       Review of Systems Denies recent fever or chills. Denies sinus pressure, nasal congestion, ear pain or sore throat. Denies chest congestion, productive cough or wheezing. Denies chest pains, palpitations and leg swelling Denies abdominal pain, nausea, vomiting,diarrhea or constipation.   Denies dysuria, frequency, hesitancy or incontinence. Denies joint pain, swelling and limitation in mobility. Denies headaches, seizures, numbness, or tingling. Denies depression, anxiety or insomnia. Denies skin break down or rash.        Objective:   Physical Exam Patient alert and oriented and in no cardiopulmonary distress.  HEENT: No facial asymmetry, EOMI, no sinus tenderness,  oropharynx pink and moist.  Neck supple no adenopathy.  Chest: Clear to auscultation bilaterally.  CVS: S1, S2 no murmurs, no S3.  ABD: Soft non tender. Bowel sounds normal.  Ext: No edema  MS: Adequate ROM spine, shoulders, hips and knees.  Skin: Intact, no ulcerations or rash noted.  Psych: Good eye contact, normal affect. Memory intact not anxious or depressed appearing.  CNS: CN 2-12 intact, power, tone and sensation normal throughout.        Assessment & Plan:

## 2011-11-01 ENCOUNTER — Other Ambulatory Visit: Payer: Self-pay | Admitting: Hematology and Oncology

## 2011-11-01 ENCOUNTER — Encounter (HOSPITAL_BASED_OUTPATIENT_CLINIC_OR_DEPARTMENT_OTHER): Payer: 59 | Admitting: Hematology and Oncology

## 2011-11-01 DIAGNOSIS — C9000 Multiple myeloma not having achieved remission: Secondary | ICD-10-CM

## 2011-11-01 DIAGNOSIS — R197 Diarrhea, unspecified: Secondary | ICD-10-CM

## 2011-11-01 LAB — CBC WITH DIFFERENTIAL/PLATELET
BASO%: 0.2 % (ref 0.0–2.0)
Basophils Absolute: 0 10*3/uL (ref 0.0–0.1)
EOS%: 1.2 % (ref 0.0–7.0)
Eosinophils Absolute: 0.1 10*3/uL (ref 0.0–0.5)
HCT: 38.5 % (ref 34.8–46.6)
HGB: 12.8 g/dL (ref 11.6–15.9)
LYMPH%: 16.6 % (ref 14.0–49.7)
MCH: 32.6 pg (ref 25.1–34.0)
MCHC: 33.1 g/dL (ref 31.5–36.0)
MCV: 98.3 fL (ref 79.5–101.0)
MONO#: 1 10*3/uL — ABNORMAL HIGH (ref 0.1–0.9)
MONO%: 14.5 % — ABNORMAL HIGH (ref 0.0–14.0)
NEUT#: 4.5 10*3/uL (ref 1.5–6.5)
NEUT%: 67.5 % (ref 38.4–76.8)
Platelets: 111 10*3/uL — ABNORMAL LOW (ref 145–400)
RBC: 3.92 10*6/uL (ref 3.70–5.45)
RDW: 15.3 % — ABNORMAL HIGH (ref 11.2–14.5)
WBC: 6.7 10*3/uL (ref 3.9–10.3)
lymph#: 1.1 10*3/uL (ref 0.9–3.3)

## 2011-11-01 LAB — COMPREHENSIVE METABOLIC PANEL
ALT: 40 U/L — ABNORMAL HIGH (ref 0–35)
CO2: 26 mEq/L (ref 19–32)
Calcium: 10 mg/dL (ref 8.4–10.5)
Chloride: 104 mEq/L (ref 96–112)
Sodium: 141 mEq/L (ref 135–145)
Total Bilirubin: 1 mg/dL (ref 0.3–1.2)
Total Protein: 6.3 g/dL (ref 6.0–8.3)

## 2011-11-08 ENCOUNTER — Other Ambulatory Visit: Payer: Self-pay | Admitting: Family Medicine

## 2011-11-08 ENCOUNTER — Telehealth: Payer: Self-pay | Admitting: *Deleted

## 2011-11-08 DIAGNOSIS — C9000 Multiple myeloma not having achieved remission: Secondary | ICD-10-CM

## 2011-11-08 MED ORDER — DEXAMETHASONE 4 MG PO TABS: ORAL_TABLET | ORAL | Status: DC

## 2011-11-09 ENCOUNTER — Encounter: Payer: Self-pay | Admitting: *Deleted

## 2011-11-19 ENCOUNTER — Other Ambulatory Visit: Payer: Self-pay | Admitting: *Deleted

## 2011-11-19 ENCOUNTER — Telehealth: Payer: Self-pay | Admitting: *Deleted

## 2011-11-19 MED ORDER — LENALIDOMIDE 15 MG PO CAPS: 15.0000 mg | ORAL_CAPSULE | ORAL | Status: DC

## 2011-11-19 NOTE — Telephone Encounter (Signed)
Faxed refill request for Revlimid  15 mg po daily  X  14 days  -  Signed by  Dr.  Arline Asp  -   To  [facility]. Authorization   #   [id number]. Fax    [phone].

## 2011-11-19 NOTE — Telephone Encounter (Signed)
RECEIVED A FAX FROM [facility] CONCERNING A PRESCRIPTION REFILL REQUEST FOR REVLIMID. THIS REQUEST WAS PLACED IN DR.ODOGWU'S BOX.

## 2011-11-23 ENCOUNTER — Other Ambulatory Visit: Payer: Self-pay | Admitting: Hematology and Oncology

## 2011-11-23 ENCOUNTER — Other Ambulatory Visit (HOSPITAL_BASED_OUTPATIENT_CLINIC_OR_DEPARTMENT_OTHER): Payer: 59 | Admitting: Lab

## 2011-11-23 ENCOUNTER — Ambulatory Visit (HOSPITAL_BASED_OUTPATIENT_CLINIC_OR_DEPARTMENT_OTHER): Payer: 59 | Admitting: Hematology and Oncology

## 2011-11-23 DIAGNOSIS — D539 Nutritional anemia, unspecified: Secondary | ICD-10-CM

## 2011-11-23 DIAGNOSIS — C9000 Multiple myeloma not having achieved remission: Secondary | ICD-10-CM

## 2011-11-23 LAB — COMPREHENSIVE METABOLIC PANEL
ALT: 28 U/L (ref 0–35)
AST: 19 U/L (ref 0–37)
Alkaline Phosphatase: 52 U/L (ref 39–117)
CO2: 24 mEq/L (ref 19–32)
Sodium: 140 mEq/L (ref 135–145)
Total Bilirubin: 1 mg/dL (ref 0.3–1.2)
Total Protein: 5.7 g/dL — ABNORMAL LOW (ref 6.0–8.3)

## 2011-11-23 LAB — CBC WITH DIFFERENTIAL/PLATELET
BASO%: 0.2 % (ref 0.0–2.0)
EOS%: 0.7 % (ref 0.0–7.0)
LYMPH%: 12.2 % — ABNORMAL LOW (ref 14.0–49.7)
MCHC: 32.8 g/dL (ref 31.5–36.0)
MONO#: 0.4 10*3/uL (ref 0.1–0.9)
MONO%: 6.4 % (ref 0.0–14.0)
Platelets: 108 10*3/uL — ABNORMAL LOW (ref 145–400)
RBC: 3.58 10*6/uL — ABNORMAL LOW (ref 3.70–5.45)
WBC: 6.6 10*3/uL (ref 3.9–10.3)

## 2011-11-23 NOTE — Progress Notes (Signed)
This office note has been dictated.

## 2011-11-23 NOTE — Progress Notes (Signed)
CC:   Milus Mallick. [name], M.D. Carrington Clamp, M.D. Paola A. Duard Brady, MD  IDENTIFYING STATEMENT:  The patient is a 60 year old woman with recurrent IgG kappa multiple myeloma, who presents for followup.  INTERIM HISTORY:  Ms Whitacre presents with no current concerns.  She has good energy levels.  She has thus tolerated the Decadron taper.  She has no nausea, vomiting, abdominal pain, diarrhea, or melena.  Her weight is stable.  She denies pain.  MEDICATIONS:  Revlimid 15 mg daily for 14 days followed by a week's rest, Decadron 60 mg weekly.  The rest of the medications are reviewed and updated.  ALLERGIES:  Codeine sulfate.  REVIEW OF SYSTEMS:  A 10-point review of systems is negative.  PHYSICAL EXAMINATION:  The patient is a well-appearing, well-nourished woman in no distress.  Vitals:  Pulse 83, blood pressure 141/77, temperature 97.6, respirations 20, weight 225 pounds.  HEENT:  Head is atraumatic, normocephalic.  Sclerae anicteric.  Mouth moist.  Chest: Clear.  CVS:  Unremarkable.  Abdomen:  Soft, nontender.  Bowel sounds present.  Extremities:  No edema.  LABORATORY DATA:  On 11/23/2011, white cell count 6.6, hemoglobin 11.7, hematocrit 35.7, platelets 101 (101).  CMET pending.  IMPRESSION AND PLAN:  Ms Salah is a 60 year old woman with stage II IgA kappa multiple myeloma, diagnosed in 2006.  She received thalidomide with Decadron followed by an autologous transplant with high-dose melphalan on 04/26/2006 at [facility].  She relapsed in August 2008 and went on to receive 6 cycles of Velcade from 11/11/2007 to 03/30/2008.  She had response to therapy but as the result of ongoing neuropathy, was placed on maintenance low-dose Revlimid at 10 mg.  She had a biochemical recurrence with markers increasing, and thus Revlimid was increased to 25 mg with stabilization of myeloma markers. However, at this higher dose she began having abnormal liver  function tests, and thus the dosing schedule was changed and she is currently receiving Revlimid dosed at 15 mg daily for 2 weeks, followed by a week's rest.  Her myeloma markers remain stable.  Ms Apel will continue with the current dose and schedule.  She will remain on Decadron 16 mg, and she will follow up in 6 weeks' time.   To do list: She needs to schedule bone density testing.    ______________________________ Laurice Record, M.D. LIO/MEDQ  D:  11/23/2011  T:  11/23/2011  Job:  [id number]

## 2011-11-29 ENCOUNTER — Other Ambulatory Visit: Payer: Self-pay | Admitting: Family Medicine

## 2011-12-07 ENCOUNTER — Other Ambulatory Visit: Payer: Self-pay | Admitting: Certified Registered Nurse Anesthetist

## 2011-12-07 DIAGNOSIS — C9000 Multiple myeloma not having achieved remission: Secondary | ICD-10-CM

## 2011-12-07 MED ORDER — DEXAMETHASONE 4 MG PO TABS: ORAL_TABLET | ORAL | Status: DC

## 2011-12-11 ENCOUNTER — Other Ambulatory Visit: Payer: Self-pay | Admitting: *Deleted

## 2011-12-11 MED ORDER — LENALIDOMIDE 15 MG PO CAPS: 15.0000 mg | ORAL_CAPSULE | ORAL | Status: DC

## 2011-12-13 ENCOUNTER — Other Ambulatory Visit (HOSPITAL_BASED_OUTPATIENT_CLINIC_OR_DEPARTMENT_OTHER): Payer: 59 | Admitting: Lab

## 2011-12-13 DIAGNOSIS — C9002 Multiple myeloma in relapse: Secondary | ICD-10-CM

## 2011-12-13 DIAGNOSIS — D539 Nutritional anemia, unspecified: Secondary | ICD-10-CM

## 2011-12-13 LAB — COMPREHENSIVE METABOLIC PANEL
ALT: 40 U/L — ABNORMAL HIGH (ref 0–35)
Alkaline Phosphatase: 54 U/L (ref 39–117)
Creatinine, Ser: 0.81 mg/dL (ref 0.50–1.10)
Sodium: 142 mEq/L (ref 135–145)
Total Bilirubin: 1 mg/dL (ref 0.3–1.2)
Total Protein: 6.2 g/dL (ref 6.0–8.3)

## 2011-12-13 LAB — CBC WITH DIFFERENTIAL/PLATELET
BASO%: 0.2 % (ref 0.0–2.0)
LYMPH%: 15.4 % (ref 14.0–49.7)
MCH: 32.8 pg (ref 25.1–34.0)
MCHC: 32.9 g/dL (ref 31.5–36.0)
MCV: 99.6 fL (ref 79.5–101.0)
MONO%: 15.6 % — ABNORMAL HIGH (ref 0.0–14.0)
NEUT%: 67.8 % (ref 38.4–76.8)
Platelets: 112 10*3/uL — ABNORMAL LOW (ref 145–400)
RBC: 3.75 10*6/uL (ref 3.70–5.45)

## 2011-12-14 ENCOUNTER — Telehealth: Payer: Self-pay | Admitting: *Deleted

## 2011-12-14 NOTE — Telephone Encounter (Signed)
Dr. Dalene Carrow reviewed lab results done 12/13/11.   Spoke with pt at home and instructed pt to restart Revlimid 15 mg daily today  12/14/11  X 14 days as per md's instructions.

## 2011-12-31 ENCOUNTER — Encounter: Payer: Self-pay | Admitting: Family Medicine

## 2012-01-02 ENCOUNTER — Ambulatory Visit (INDEPENDENT_AMBULATORY_CARE_PROVIDER_SITE_OTHER): Payer: 59 | Admitting: Family Medicine

## 2012-01-02 ENCOUNTER — Encounter: Payer: Self-pay | Admitting: Family Medicine

## 2012-01-02 VITALS — BP 100/70 | HR 95 | Temp 99.1°F | Resp 16 | Ht 66.0 in | Wt 217.0 lb

## 2012-01-02 DIAGNOSIS — J209 Acute bronchitis, unspecified: Secondary | ICD-10-CM

## 2012-01-02 DIAGNOSIS — E119 Type 2 diabetes mellitus without complications: Secondary | ICD-10-CM

## 2012-01-02 DIAGNOSIS — I10 Essential (primary) hypertension: Secondary | ICD-10-CM

## 2012-01-02 DIAGNOSIS — E669 Obesity, unspecified: Secondary | ICD-10-CM

## 2012-01-02 MED ORDER — VALSARTAN-HYDROCHLOROTHIAZIDE 320-12.5 MG PO TABS: 1.0000 | ORAL_TABLET | Freq: Every day | ORAL | Status: DC

## 2012-01-02 MED ORDER — PENICILLIN V POTASSIUM 500 MG PO TABS: 500.0000 mg | ORAL_TABLET | Freq: Four times a day (QID) | ORAL | Status: AC

## 2012-01-02 MED ORDER — CEFTRIAXONE SODIUM 1 G IJ SOLR
500.0000 mg | Freq: Once | INTRAMUSCULAR | Status: AC
  Administered 2012-01-02: 500 mg via INTRAMUSCULAR

## 2012-01-02 MED ORDER — BENZONATATE 100 MG PO CAPS: 100.0000 mg | ORAL_CAPSULE | Freq: Three times a day (TID) | ORAL | Status: DC

## 2012-01-02 NOTE — Patient Instructions (Addendum)
F/u as before.   You are being treated for bronchitis, rocephin is being administered in the office and penicillin and decongestants are sent to the pharmacy  Dose reduction in medication for blood pressure

## 2012-01-03 ENCOUNTER — Other Ambulatory Visit: Payer: Self-pay | Admitting: *Deleted

## 2012-01-03 NOTE — Telephone Encounter (Signed)
THIS REQUEST WAS GIVEN IN DR.ODOGWU'S NURSE, KASIE MAHAN,RN.

## 2012-01-04 ENCOUNTER — Other Ambulatory Visit: Payer: Self-pay | Admitting: *Deleted

## 2012-01-04 ENCOUNTER — Other Ambulatory Visit (HOSPITAL_BASED_OUTPATIENT_CLINIC_OR_DEPARTMENT_OTHER): Payer: 59 | Admitting: Lab

## 2012-01-04 ENCOUNTER — Ambulatory Visit (HOSPITAL_BASED_OUTPATIENT_CLINIC_OR_DEPARTMENT_OTHER): Payer: 59 | Admitting: Hematology and Oncology

## 2012-01-04 VITALS — BP 149/81 | HR 110 | Temp 97.1°F | Ht 66.0 in | Wt 215.3 lb

## 2012-01-04 DIAGNOSIS — D539 Nutritional anemia, unspecified: Secondary | ICD-10-CM

## 2012-01-04 DIAGNOSIS — C9000 Multiple myeloma not having achieved remission: Secondary | ICD-10-CM

## 2012-01-04 LAB — COMPREHENSIVE METABOLIC PANEL
Alkaline Phosphatase: 57 U/L (ref 39–117)
CO2: 22 mEq/L (ref 19–32)
Creatinine, Ser: 1.03 mg/dL (ref 0.50–1.10)
Glucose, Bld: 200 mg/dL — ABNORMAL HIGH (ref 70–99)
Sodium: 139 mEq/L (ref 135–145)
Total Bilirubin: 0.9 mg/dL (ref 0.3–1.2)
Total Protein: 6.5 g/dL (ref 6.0–8.3)

## 2012-01-04 LAB — CBC WITH DIFFERENTIAL/PLATELET
Eosinophils Absolute: 0 10*3/uL (ref 0.0–0.5)
HCT: 40.3 % (ref 34.8–46.6)
LYMPH%: 11.1 % — ABNORMAL LOW (ref 14.0–49.7)
MCHC: 33.5 g/dL (ref 31.5–36.0)
MCV: 98.9 fL (ref 79.5–101.0)
MONO#: 0.1 10*3/uL (ref 0.1–0.9)
MONO%: 1.8 % (ref 0.0–14.0)
NEUT#: 7 10*3/uL — ABNORMAL HIGH (ref 1.5–6.5)
NEUT%: 86.8 % — ABNORMAL HIGH (ref 38.4–76.8)
Platelets: 164 10*3/uL (ref 145–400)
RBC: 4.07 10*6/uL (ref 3.70–5.45)

## 2012-01-04 MED ORDER — LENALIDOMIDE 15 MG PO CAPS: 15.0000 mg | ORAL_CAPSULE | ORAL | Status: DC

## 2012-01-04 NOTE — Progress Notes (Signed)
CC:   Milus Mallick. [name], M.D. Carrington Clamp, M.D. Paola A. Duard Brady, MD  IDENTIFYING STATEMENT:  The patient is a 61 year old woman with recurrent IgG kappa multiple myeloma, who presents for followup.  INTERIM HISTORY:  Ms. Mcbryar continues with Revlimid and weekly Decadron. She notes that she has completed a course of antibiotics for bronchitis. She is presently afebrile.  She feels well.  She has no nausea, vomiting or abdominal pain.  She has good energy levels.  Specifically notes no bleeding or diarrhea.  Has not noted any lymph nodes or bone pain.  MEDICATIONS:  Revlimid 15 mg daily for 14 followed by a week's rest, Decadron 16 mg weekly.  Rest of medications reviewed and updated.  ALLERGIES:  Codeine.  REVIEW OF SYSTEMS:  Ten-point review of systems negative.  PHYSICAL EXAMINATION:  The patient is a well-appearing, well-nourished woman in no distress.  vital signs:  Pulse 110, blood pressure 149/81, temperature 97.1, respirations 20, weight is 215.3 pounds.  HEENT:  Head is atraumatic, normocephalic.  Sclerae anicteric.  Mouth moist.  Chest: Clear.  CVS:  Unremarkable.  Abdomen:  Soft, nontender.  Bowel sounds present.  Extremities:  No edema.  LABORATORY DATA:  01/04/2012:  White cell count 8, hemoglobin 13.5, hematocrit 40.3, platelets 164.  A CMET pending.  IMPRESSION/PLAN:  Ms. Anglemyer is a 61 year old woman with a history of a stage II IgA kappa multiple myeloma, diagnosed in 2006.  She received thalidomide with Decadron followed by an autologous transplant with high- dose melphalan on 04/26/2006 at [facility].  She relapsed in August 2008 and went on to receive 6 cycles of Velcade from 11/11/2007 through 03/30/2008.  She had response to therapy but as result of ongoing neuropathy was placed on maintenance low-dose Revlimid at 10 mg.  She had a biochemical recurrence with markers increasing and thus Revlimid was increased to 25 mg with  stabilization of myeloma markers.  Revlimid was then subsequently dose-reduced to 15 mg daily for 2 weeks due to abnormal LFTs.  Her last myeloma markers are stable.  Ms. Lavin feels and appears to be doing well.  Thus, we will continue with current dose of that Revlimid at 15 mg daily for 2 weeks followed by a week off.  We will also decrease Decadron to 12 mg weekly.  She follows up in 6 weeks' time.  She will touch base with Dr. [name] to schedule a bone density test.    ______________________________ Laurice Record, M.D. LIO/MEDQ  D:  01/04/2012  T:  01/04/2012  Job:  [id number]

## 2012-01-04 NOTE — Progress Notes (Signed)
This office note has been dictated.

## 2012-01-06 NOTE — Assessment & Plan Note (Signed)
Improved. Pt applauded on succesful weight loss through lifestyle change, and encouraged to continue same. Weight loss goal set for the next several months.  

## 2012-01-06 NOTE — Assessment & Plan Note (Signed)
Acute episode. Rocephin administered and penicillin and decongestants prescribed

## 2012-01-06 NOTE — Assessment & Plan Note (Signed)
Controlled, no change in medication  

## 2012-01-06 NOTE — Assessment & Plan Note (Signed)
Over corrected, dose reduction in diovan/hctz

## 2012-01-06 NOTE — Progress Notes (Signed)
  Subjective:    Patient ID: Darlene Howard, female    DOB: 1951-05-25, 61 y.o.   MRN: [id number]  HPI 1 week h/o chest congestion with cough productive of yellow sputum. Denies nasal congestion, sinus pressure, sore throat . Has had intermittent chills . No documented fever. Denies ear pain. Has been working on dietary change with weight loss success. Denies polyuria, polydipsia, blurred vision or hypoglycemic episodes   Review of Systems See HPI  Denies abdominal pain, nausea, vomiting,diarrhea or constipation.   Denies dysuria, frequency, hesitancy or incontinence. Denies joint pain, swelling and limitation in mobility. Denies headaches, seizures, numbness, or tingling. Denies depression, anxiety or insomnia. Denies skin break down or rash.        Objective:   Physical Exam  Patient alert and oriented and in no cardiopulmonary distress.Moldly ill appearing  HEENT: No facial asymmetry, EOMI, no sinus tenderness,  oropharynx pink and moist.  Neck supple no adenopathy.  Chest: Decreased air entry, bilateral crackles , no wheezes CVS: S1, S2 no murmurs, no S3.  ABD: Soft non tender. Bowel sounds normal.  Ext: No edema  MS: Adequate ROM spine, shoulders, hips and knees.  Skin: Intact, no ulcerations or rash noted.  Psych: Good eye contact, normal affect. Memory intact not anxious or depressed appearing.  CNS: CN 2-12 intact, power, tone and sensation normal throughout.       Assessment & Plan:

## 2012-01-16 ENCOUNTER — Telehealth: Payer: Self-pay | Admitting: Hematology and Oncology

## 2012-01-16 NOTE — Telephone Encounter (Signed)
Moved 2/7 appt from AM to PM. LO working PM instead of AM. S/w pt today re change w/new appt for 2/7 @ 2 pm.

## 2012-01-23 ENCOUNTER — Other Ambulatory Visit: Payer: Self-pay | Admitting: *Deleted

## 2012-01-23 ENCOUNTER — Encounter: Payer: Self-pay | Admitting: *Deleted

## 2012-01-23 MED ORDER — LENALIDOMIDE 15 MG PO CAPS: ORAL_CAPSULE | ORAL | Status: DC

## 2012-01-23 MED ORDER — LENALIDOMIDE 15 MG PO CAPS: 15.0000 mg | ORAL_CAPSULE | Freq: Every day | ORAL | Status: DC

## 2012-01-23 NOTE — Progress Notes (Signed)
Refill request received from [facility] for Revlimid.  Request to MD for review.

## 2012-01-23 NOTE — Telephone Encounter (Signed)
Faxed refill request for Revlimid 15 mg po daily x 14 days to [facility] .   Authorization  #   [id number].

## 2012-01-24 ENCOUNTER — Other Ambulatory Visit (HOSPITAL_BASED_OUTPATIENT_CLINIC_OR_DEPARTMENT_OTHER): Payer: 59 | Admitting: Lab

## 2012-01-24 DIAGNOSIS — C9000 Multiple myeloma not having achieved remission: Secondary | ICD-10-CM

## 2012-01-24 LAB — CBC WITH DIFFERENTIAL/PLATELET
Basophils Absolute: 0 10*3/uL (ref 0.0–0.1)
Eosinophils Absolute: 0.2 10*3/uL (ref 0.0–0.5)
HCT: 35.9 % (ref 34.8–46.6)
HGB: 12.1 g/dL (ref 11.6–15.9)
LYMPH%: 14.8 % (ref 14.0–49.7)
MCV: 100.1 fL (ref 79.5–101.0)
MONO#: 0.7 10*3/uL (ref 0.1–0.9)
MONO%: 8.1 % (ref 0.0–14.0)
NEUT#: 6.1 10*3/uL (ref 1.5–6.5)
NEUT%: 74.9 % (ref 38.4–76.8)
Platelets: 106 10*3/uL — ABNORMAL LOW (ref 145–400)
RBC: 3.59 10*6/uL — ABNORMAL LOW (ref 3.70–5.45)
WBC: 8.2 10*3/uL (ref 3.9–10.3)

## 2012-01-24 LAB — COMPREHENSIVE METABOLIC PANEL
Alkaline Phosphatase: 55 U/L (ref 39–117)
BUN: 10 mg/dL (ref 6–23)
CO2: 26 mEq/L (ref 19–32)
Glucose, Bld: 125 mg/dL — ABNORMAL HIGH (ref 70–99)
Sodium: 144 mEq/L (ref 135–145)
Total Bilirubin: 1.1 mg/dL (ref 0.3–1.2)
Total Protein: 6 g/dL (ref 6.0–8.3)

## 2012-01-25 ENCOUNTER — Telehealth: Payer: Self-pay | Admitting: *Deleted

## 2012-01-25 NOTE — Telephone Encounter (Signed)
Spoke with pt and informed pt re:  Labs ok from 01/24/12.   Proceed with  Next cycle of Revlimid  On  01/28/12   As per md.    Pt voiced understanding.

## 2012-02-07 ENCOUNTER — Ambulatory Visit: Payer: 59 | Admitting: Hematology and Oncology

## 2012-02-07 ENCOUNTER — Ambulatory Visit (HOSPITAL_BASED_OUTPATIENT_CLINIC_OR_DEPARTMENT_OTHER): Payer: 59 | Admitting: Hematology and Oncology

## 2012-02-07 ENCOUNTER — Other Ambulatory Visit: Payer: 59 | Admitting: Lab

## 2012-02-07 ENCOUNTER — Other Ambulatory Visit (HOSPITAL_BASED_OUTPATIENT_CLINIC_OR_DEPARTMENT_OTHER): Payer: 59 | Admitting: Lab

## 2012-02-07 ENCOUNTER — Telehealth: Payer: Self-pay | Admitting: Hematology and Oncology

## 2012-02-07 DIAGNOSIS — C9000 Multiple myeloma not having achieved remission: Secondary | ICD-10-CM

## 2012-02-07 LAB — CBC WITH DIFFERENTIAL/PLATELET
Basophils Absolute: 0 10*3/uL (ref 0.0–0.1)
Eosinophils Absolute: 0.1 10*3/uL (ref 0.0–0.5)
HGB: 13 g/dL (ref 11.6–15.9)
LYMPH%: 13.7 % — ABNORMAL LOW (ref 14.0–49.7)
MCV: 99.6 fL (ref 79.5–101.0)
MONO#: 0.9 10*3/uL (ref 0.1–0.9)
NEUT#: 6.2 10*3/uL (ref 1.5–6.5)
Platelets: 160 10*3/uL (ref 145–400)
RBC: 3.96 10*6/uL (ref 3.70–5.45)
RDW: 15.3 % — ABNORMAL HIGH (ref 11.2–14.5)
WBC: 8.2 10*3/uL (ref 3.9–10.3)

## 2012-02-07 LAB — BASIC METABOLIC PANEL
BUN: 10 mg/dL (ref 6–23)
CO2: 26 mEq/L (ref 19–32)
Glucose, Bld: 137 mg/dL — ABNORMAL HIGH (ref 70–99)
Potassium: 3.8 mEq/L (ref 3.5–5.3)
Sodium: 138 mEq/L (ref 135–145)

## 2012-02-07 NOTE — Telephone Encounter (Signed)
02/28,3/8 appts made and printed aom

## 2012-02-07 NOTE — Progress Notes (Signed)
CC:   Darlene Howard. Darlene Howard, M.D. Darlene Howard, M.D. Darlene A. Duard Brady, MD  IDENTIFYING STATEMENT:  Patient is a 61 year old woman with recurrent IgG kappa multiple myeloma who presents for followup.  INTERVAL HISTORY:  Darlene Howard has no current concerns.  Continues with Revlimid and weekly Decadron.  Her weight is stable.  Denies pain. Denies diarrhea.  MEDICATIONS:  Revlimid 15 mg daily for 14 followed by a week's rest. Decadron 12 mg weekly.  Rest of medicines reviewed and updated.  ALLERGIES:  Codeine.  Past medical history, family history, and social history unchanged.  REVIEW OF SYSTEMS:  Ten-point review of systems negative.  PHYSICAL EXAM:  The patient is a well-appearing, well-nourished woman in no distress.  Vitals:  Pulse 91, blood pressure 132/75, temp 97.1, respirations 20, weight is 180 pounds.  HEENT:  Head is atraumatic, normocephalic.  Sclerae anicteric.  Mouth moist.  Neck:  Supple.  Chest: Clear.  CVS:  First and second heart sounds present.  No added sounds or murmurs.  Abdomen:  Soft, nontender.  No masses.  Bowel sounds present. Extremities:  Without edema.  Pulses present, symmetrical.  Lymph nodes: No adenopathy.  CNS:  Nonfocal.  LAB DATA:  02/07/2012, white cell count 8.2, hemoglobin 13, hematocrit 39.4, platelets 160.  C-MET pending.  IMPRESSION AND PLAN:  Darlene Howard is a 61 year old woman with history of stage II, IgA kappa multiple myeloma diagnosed in 2006.  She received thalidomide with Decadron followed by an autologous transplant with high- dose melphalan on 04/26/2006 at [facility].  She was last seen August 2008 and went on to receive 6 cycles of Velcade from 11/11/2007 through 03/30/2008.  She had response to therapy but result of ongoing neuropathy, was placed on maintenance low-dose Revlimid at 10 mg.  She had a biochemical recurrence with markers increasing and thus Revlimid was increased to 25 mg with stabilization of  myeloma markers. The Revlimid was then subsequently dose reduced by 50 mg daily for 2 weeks due to abnormal LFTs.  She has remained stable since.  Darlene Howard's labs are adequate, and thus she will continue with current treatment plan.  She will receive Revlimid at 50 mg daily for 2 weeks followed by a week off.  Decadron will be scheduled weekly dose of 12 mg.  She follows up in 6 weeks' time.    ______________________________ Laurice Record, M.D. LIO/MEDQ  D:  02/07/2012  T:  02/07/2012  Job:  [id number]

## 2012-02-07 NOTE — Progress Notes (Signed)
This office note has been dictated.

## 2012-02-12 ENCOUNTER — Other Ambulatory Visit: Payer: Self-pay | Admitting: *Deleted

## 2012-02-12 MED ORDER — LENALIDOMIDE 15 MG PO CAPS: 15.0000 mg | ORAL_CAPSULE | Freq: Every day | ORAL | Status: DC

## 2012-02-12 NOTE — Telephone Encounter (Signed)
THIS REQUEST WAS PLACED IN DR.ODOGWU'S INBOX. 

## 2012-02-15 ENCOUNTER — Other Ambulatory Visit: Payer: 59 | Admitting: Lab

## 2012-02-15 LAB — COMPREHENSIVE METABOLIC PANEL
ALT: 40 U/L — ABNORMAL HIGH (ref 0–35)
Albumin: 4.3 g/dL (ref 3.5–5.2)
CO2: 23 mEq/L (ref 19–32)
Glucose, Bld: 231 mg/dL — ABNORMAL HIGH (ref 70–99)
Potassium: 4.3 mEq/L (ref 3.5–5.3)
Sodium: 139 mEq/L (ref 135–145)
Total Bilirubin: 1 mg/dL (ref 0.3–1.2)
Total Protein: 6.6 g/dL (ref 6.0–8.3)

## 2012-02-15 LAB — CBC WITH DIFFERENTIAL/PLATELET
BASO%: 0.3 % (ref 0.0–2.0)
Eosinophils Absolute: 0 10*3/uL (ref 0.0–0.5)
LYMPH%: 10.3 % — ABNORMAL LOW (ref 14.0–49.7)
MCHC: 33.3 g/dL (ref 31.5–36.0)
MONO#: 0.1 10*3/uL (ref 0.1–0.9)
NEUT#: 6.1 10*3/uL (ref 1.5–6.5)
Platelets: 127 10*3/uL — ABNORMAL LOW (ref 145–400)
RBC: 3.98 10*6/uL (ref 3.70–5.45)
RDW: 15.1 % — ABNORMAL HIGH (ref 11.2–14.5)
WBC: 6.9 10*3/uL (ref 3.9–10.3)
lymph#: 0.7 10*3/uL — ABNORMAL LOW (ref 0.9–3.3)

## 2012-02-27 LAB — BASIC METABOLIC PANEL
BUN: 9 mg/dL (ref 6–23)
Calcium: 9.3 mg/dL (ref 8.4–10.5)
Glucose, Bld: 141 mg/dL — ABNORMAL HIGH (ref 70–99)

## 2012-02-28 ENCOUNTER — Ambulatory Visit (INDEPENDENT_AMBULATORY_CARE_PROVIDER_SITE_OTHER): Payer: 59 | Admitting: Family Medicine

## 2012-02-28 ENCOUNTER — Encounter: Payer: Self-pay | Admitting: Family Medicine

## 2012-02-28 VITALS — BP 140/80 | HR 108 | Resp 16 | Ht 66.0 in | Wt 218.0 lb

## 2012-02-28 DIAGNOSIS — E119 Type 2 diabetes mellitus without complications: Secondary | ICD-10-CM

## 2012-02-28 DIAGNOSIS — M899 Disorder of bone, unspecified: Secondary | ICD-10-CM

## 2012-02-28 DIAGNOSIS — B351 Tinea unguium: Secondary | ICD-10-CM

## 2012-02-28 DIAGNOSIS — Z13220 Encounter for screening for lipoid disorders: Secondary | ICD-10-CM

## 2012-02-28 DIAGNOSIS — C9000 Multiple myeloma not having achieved remission: Secondary | ICD-10-CM

## 2012-02-28 DIAGNOSIS — I10 Essential (primary) hypertension: Secondary | ICD-10-CM

## 2012-02-28 DIAGNOSIS — M949 Disorder of cartilage, unspecified: Secondary | ICD-10-CM

## 2012-02-28 DIAGNOSIS — E669 Obesity, unspecified: Secondary | ICD-10-CM

## 2012-02-28 DIAGNOSIS — Z13820 Encounter for screening for osteoporosis: Secondary | ICD-10-CM

## 2012-02-28 MED ORDER — PANTOPRAZOLE SODIUM 40 MG PO TBEC: DELAYED_RELEASE_TABLET | ORAL | Status: DC

## 2012-02-28 MED ORDER — TERBINAFINE HCL 250 MG PO TABS: 250.0000 mg | ORAL_TABLET | Freq: Every day | ORAL | Status: DC

## 2012-02-28 MED ORDER — AMLODIPINE BESYLATE 2.5 MG PO TABS: ORAL_TABLET | ORAL | Status: DC

## 2012-02-28 NOTE — Progress Notes (Signed)
  Subjective:    Patient ID: Darlene Howard, female    DOB: 1951-04-16, 61 y.o.   MRN: [id number]  HPI The PT is here for follow up and re-evaluation of chronic medical conditions, medication management and review of any available recent lab and radiology data.  Preventive health is updated, specifically  Cancer screening and Immunization.   Questions or concerns regarding consultations or procedures which the PT has had in the interim are  addressed. The PT denies any adverse reactions to current medications since the last visit.  There are no new concerns.  There are no specific complaints  Eye exams are every 3 months, as she has  glaucoma. Denies polyuria, or polydipsia, fasting blood sugar seldom over 110      Review of Systems See HPI Denies recent fever or chills. Denies sinus pressure, nasal congestion, ear pain or sore throat. Denies chest congestion, productive cough or wheezing. Denies chest pains, palpitations and leg swelling Denies abdominal pain, nausea, vomiting,diarrhea or constipation.   Denies dysuria, frequency, hesitancy or incontinence. Denies joint pain, swelling and limitation in mobility. Denies headaches, seizures, numbness, or tingling. Denies depression, anxiety or insomnia. Denies skin break down or rash.        Objective:   Physical Exam  Patient alert and oriented and in no cardiopulmonary distress.  HEENT: No facial asymmetry, EOMI, no sinus tenderness,  oropharynx pink and moist.  Neck supple no adenopathy.  Chest: Clear to auscultation bilaterally.  CVS: S1, S2 no murmurs, no S3.  ABD: Soft non tender. Bowel sounds normal.  Ext: No edema  MS: Adequate ROM spine, shoulders, hips and knees.  Skin: Intact, no ulcerations or rash noted.  Psych: Good eye contact, normal affect. Memory intact not anxious or depressed appearing.  CNS: CN 2-12 intact, power, tone and sensation normal throughout. Diabetic Foot Check:  Appearance - corns  and calluses Skin - no unusual pallor or redness, onychomycosis Sensation - grossly intact to light touch Monofilament testing -  Right - Great toe, medial, central, lateral ball and posterior foot intact Left - Great toe, medial, central, lateral ball and posterior foot diminished Pulses Left - Dorsalis Pedis and Posterior Tibia normal Right - Dorsalis Pedis and Posterior Tibia normal       Assessment & Plan:

## 2012-02-28 NOTE — Patient Instructions (Addendum)
F/u in 4 month  You are being referred to podiatry.  Blood sugar is excellent no med change  Eye exam next week for your glaucoma f/u  It is important that you exercise regularly at least 30 minutes 5 times a week. If you develop chest pain, have severe difficulty breathing, or feel very tired, stop exercising immediately and seek medical attention   pls cut back obn salt and canned  Foods also this will help blood pressure  New med for fungus in toenails  Fasting lipid, cmp EgFR, hBA1C, VIT D in 4.5 month  Microalb today  You will be referred for bone density 1 year after the last

## 2012-02-28 NOTE — Assessment & Plan Note (Signed)
Sub optimal control, no med change 

## 2012-02-29 ENCOUNTER — Other Ambulatory Visit: Payer: Self-pay | Admitting: Family Medicine

## 2012-02-29 ENCOUNTER — Other Ambulatory Visit: Payer: Self-pay

## 2012-02-29 MED ORDER — POTASSIUM CHLORIDE CRYS ER 20 MEQ PO TBCR: 20.0000 meq | EXTENDED_RELEASE_TABLET | Freq: Two times a day (BID) | ORAL | Status: DC

## 2012-03-01 LAB — MICROALBUMIN / CREATININE URINE RATIO
Creatinine, Urine: 100.1 mg/dL
Microalb Creat Ratio: 5.3 mg/g (ref 0.0–30.0)

## 2012-03-01 NOTE — Assessment & Plan Note (Signed)
Followed closely by oncology

## 2012-03-01 NOTE — Assessment & Plan Note (Signed)
Controlled, no change in medication  

## 2012-03-01 NOTE — Assessment & Plan Note (Signed)
Medication prescribed

## 2012-03-01 NOTE — Assessment & Plan Note (Signed)
Improved. Pt applauded on succesful weight loss through lifestyle change, and encouraged to continue same. Weight loss goal set for the next several months.  

## 2012-03-03 ENCOUNTER — Other Ambulatory Visit: Payer: Self-pay | Admitting: Family Medicine

## 2012-03-04 ENCOUNTER — Other Ambulatory Visit: Payer: Self-pay | Admitting: *Deleted

## 2012-03-04 MED ORDER — LENALIDOMIDE 15 MG PO CAPS: 15.0000 mg | ORAL_CAPSULE | Freq: Every day | ORAL | Status: DC

## 2012-03-04 NOTE — Telephone Encounter (Signed)
THIS REQUEST WAS PLACED IN DR.ODOGWU'S ACTIVE WORK BIN.

## 2012-03-07 ENCOUNTER — Other Ambulatory Visit (HOSPITAL_BASED_OUTPATIENT_CLINIC_OR_DEPARTMENT_OTHER): Payer: 59 | Admitting: Lab

## 2012-03-07 ENCOUNTER — Ambulatory Visit (HOSPITAL_BASED_OUTPATIENT_CLINIC_OR_DEPARTMENT_OTHER): Payer: 59 | Admitting: Hematology and Oncology

## 2012-03-07 ENCOUNTER — Encounter: Payer: Self-pay | Admitting: Hematology and Oncology

## 2012-03-07 VITALS — BP 168/90 | HR 101 | Temp 97.0°F | Ht 66.0 in | Wt 221.7 lb

## 2012-03-07 DIAGNOSIS — C9000 Multiple myeloma not having achieved remission: Secondary | ICD-10-CM

## 2012-03-07 LAB — CBC WITH DIFFERENTIAL/PLATELET
Basophils Absolute: 0 10*3/uL (ref 0.0–0.1)
EOS%: 0 % (ref 0.0–7.0)
Eosinophils Absolute: 0 10*3/uL (ref 0.0–0.5)
HGB: 12.6 g/dL (ref 11.6–15.9)
NEUT#: 5.5 10*3/uL (ref 1.5–6.5)
RBC: 3.82 10*6/uL (ref 3.70–5.45)
RDW: 14.9 % — ABNORMAL HIGH (ref 11.2–14.5)
lymph#: 0.7 10*3/uL — ABNORMAL LOW (ref 0.9–3.3)

## 2012-03-07 LAB — COMPREHENSIVE METABOLIC PANEL
AST: 26 U/L (ref 0–37)
Albumin: 4.1 g/dL (ref 3.5–5.2)
BUN: 11 mg/dL (ref 6–23)
Calcium: 9 mg/dL (ref 8.4–10.5)
Chloride: 104 mEq/L (ref 96–112)
Glucose, Bld: 322 mg/dL — ABNORMAL HIGH (ref 70–99)
Potassium: 4.1 mEq/L (ref 3.5–5.3)
Sodium: 138 mEq/L (ref 135–145)
Total Protein: 6.4 g/dL (ref 6.0–8.3)

## 2012-03-07 NOTE — Progress Notes (Signed)
CC:   Darlene Howard. Darlene Howard, M.D. Darlene Howard, M.D. Darlene A. Duard Brady, MD  IDENTIFYING STATEMENT:  The patient is a 61 year old woman with a recurrent IgG kappa multiple myeloma who presents for followup.  INTERVAL HISTORY:  Darlene Howard has no current concerns.  She is tolerating Revlimid with Decadron.  MEDICATIONS:  Revlimid 15 mg for 14 days followed by a week's rest. Decadron 12 mg weekly.  ALLERGIES:  Codeine.  Past medical history, family history and social history unchanged.  REVIEW OF SYSTEMS:  A 10 point review of systems negative.  PHYSICAL EXAMINATION:  General:  The patient is a well-appearing, well- nourished woman in no distress.  Vitals:  Pulse 101, blood pressure 168/90, temp 97, respirations 20, weight 221.3 pounds.  HEENT:  Sclerae anicteric.  Mouth moist.  Neck:  Supple.  Chest:  CVS unremarkable. Abdomen:  Soft, nontender.  Bowel sounds present.  Extremities:  No edema.  CNS:  Nonfocal.  LAB DATA:  On 03/07/2012 white cell count 6.2, hemoglobin 12.6, hematocrit 38, platelets 124.  CMET pending.  IMPRESSION AND PLAN:  Darlene Howard is a 61 year old woman with a history of a stage II IgA kappa multiple myeloma diagnosed in 2006.  She has received thalidomide with Decadron followed by an autologous transplant with high-dose melphalan on 04/26/2006 at [facility].  She had a recurrence in August 2008 and went on to receive 6 cycles of Velcade from 11/11/2007 through 03/30/2008.  She had response to therapy but as a result of ongoing neuropathy was placed on maintenance low-dose Revlimid at 10 mg.  She had a biochemical recurrence with markers increasing and thus Revlimid was increased to 25 mg with stabilization of myeloma markers.  Revlimid was then dose reduced to 15 mg daily for 2 weeks due to abnormal LFTs.  She has remained stable since.  Darlene Howard will continue with the current treatment with Revlimid 15 mg daily for 2 weeks followed by 1  week off and Decadron dosed at 12 mg weekly.  She follows up with the transplant team at [facility] in April.  I have requested myeloma workup which she will have done there.  I have also scheduled a skeletal survey here in [place].  She follows up with me in May.    ______________________________ Laurice Record, M.D. LIO/MEDQ  D:  03/07/2012  T:  03/07/2012  Job:  [id number]

## 2012-03-07 NOTE — Progress Notes (Signed)
This office note has been dictated.

## 2012-03-10 ENCOUNTER — Telehealth: Payer: Self-pay | Admitting: *Deleted

## 2012-03-10 NOTE — Telephone Encounter (Signed)
Spoke with pt today.  Was informed that pt did not take her meds as scheduled on 03/07/12.   Pt stated she had eaten foods that could increased glucose level prior to coming to her appt on 03/07/12..   Pt stated she is feeling fine.  Glucose levels over the weekend had been low in the 100's range, and pt has been taking meds as scheduled.

## 2012-03-18 ENCOUNTER — Other Ambulatory Visit: Payer: Self-pay | Admitting: *Deleted

## 2012-03-18 MED ORDER — LENALIDOMIDE 15 MG PO CAPS: 15.0000 mg | ORAL_CAPSULE | Freq: Every day | ORAL | Status: DC

## 2012-03-18 NOTE — Telephone Encounter (Signed)
THIS REQUEST WAS PLACED IN DR.ODOGWU'S ACTIVE WORK INBOX.

## 2012-03-28 ENCOUNTER — Ambulatory Visit (HOSPITAL_COMMUNITY)
Admission: RE | Admit: 2012-03-28 | Discharge: 2012-03-28 | Disposition: A | Discharge status: Home / Self Care | Payer: 59 | Source: Ambulatory Visit | Attending: Hematology and Oncology | Admitting: Hematology and Oncology

## 2012-03-28 ENCOUNTER — Telehealth: Payer: Self-pay | Admitting: Nurse Practitioner

## 2012-03-28 ENCOUNTER — Other Ambulatory Visit (HOSPITAL_BASED_OUTPATIENT_CLINIC_OR_DEPARTMENT_OTHER): Payer: 59 | Admitting: Lab

## 2012-03-28 DIAGNOSIS — C9000 Multiple myeloma not having achieved remission: Secondary | ICD-10-CM

## 2012-03-28 DIAGNOSIS — K8020 Calculus of gallbladder without cholecystitis without obstruction: Secondary | ICD-10-CM | POA: Insufficient documentation

## 2012-03-28 LAB — COMPREHENSIVE METABOLIC PANEL
ALT: 43 U/L — ABNORMAL HIGH (ref 0–35)
AST: 32 U/L (ref 0–37)
Albumin: 4.2 g/dL (ref 3.5–5.2)
Alkaline Phosphatase: 60 U/L (ref 39–117)
Glucose, Bld: 232 mg/dL — ABNORMAL HIGH (ref 70–99)
Potassium: 4.5 mEq/L (ref 3.5–5.3)
Sodium: 139 mEq/L (ref 135–145)
Total Bilirubin: 1.3 mg/dL — ABNORMAL HIGH (ref 0.3–1.2)
Total Protein: 6.7 g/dL (ref 6.0–8.3)

## 2012-03-28 LAB — CBC WITH DIFFERENTIAL/PLATELET
BASO%: 0.4 % (ref 0.0–2.0)
Basophils Absolute: 0 10*3/uL (ref 0.0–0.1)
EOS%: 0 % (ref 0.0–7.0)
HGB: 13 g/dL (ref 11.6–15.9)
MCH: 32.6 pg (ref 25.1–34.0)
MCV: 99.2 fL (ref 79.5–101.0)
MONO%: 1.5 % (ref 0.0–14.0)
RBC: 3.99 10*6/uL (ref 3.70–5.45)
RDW: 14.9 % — ABNORMAL HIGH (ref 11.2–14.5)
lymph#: 0.7 10*3/uL — ABNORMAL LOW (ref 0.9–3.3)
nRBC: 0 % (ref 0–0)

## 2012-03-28 NOTE — Telephone Encounter (Signed)
Informed pt per Dr. Dalene Carrow- CBC ok- begin next cycle of Revlimid. Bone scan is good.  Reminded pt to have [facility] fax labs.  Pt verbalized understanding.

## 2012-03-31 ENCOUNTER — Other Ambulatory Visit: Payer: Self-pay | Admitting: Family Medicine

## 2012-04-09 ENCOUNTER — Other Ambulatory Visit: Payer: Self-pay | Admitting: Family Medicine

## 2012-04-14 ENCOUNTER — Other Ambulatory Visit: Payer: Self-pay | Admitting: *Deleted

## 2012-04-14 NOTE — Telephone Encounter (Signed)
THIS REQUEST FOR REVLIMID WAS PLACED IN DR.ODOGWU'S ACTIVE WORK BOX. 

## 2012-04-15 ENCOUNTER — Other Ambulatory Visit: Payer: Self-pay | Admitting: *Deleted

## 2012-04-15 MED ORDER — LENALIDOMIDE 15 MG PO CAPS: 15.0000 mg | ORAL_CAPSULE | Freq: Every day | ORAL | Status: DC

## 2012-04-18 ENCOUNTER — Telehealth: Payer: Self-pay | Admitting: *Deleted

## 2012-04-18 ENCOUNTER — Other Ambulatory Visit (HOSPITAL_BASED_OUTPATIENT_CLINIC_OR_DEPARTMENT_OTHER): Payer: 59 | Admitting: Lab

## 2012-04-18 DIAGNOSIS — C9000 Multiple myeloma not having achieved remission: Secondary | ICD-10-CM

## 2012-04-18 LAB — COMPREHENSIVE METABOLIC PANEL
AST: 30 U/L (ref 0–37)
Albumin: 3.9 g/dL (ref 3.5–5.2)
Alkaline Phosphatase: 63 U/L (ref 39–117)
BUN: 12 mg/dL (ref 6–23)
Glucose, Bld: 207 mg/dL — ABNORMAL HIGH (ref 70–99)
Potassium: 4.3 mEq/L (ref 3.5–5.3)
Sodium: 137 mEq/L (ref 135–145)
Total Bilirubin: 1 mg/dL (ref 0.3–1.2)

## 2012-04-18 LAB — CBC WITH DIFFERENTIAL/PLATELET
BASO%: 0.2 % (ref 0.0–2.0)
EOS%: 0 % (ref 0.0–7.0)
MCH: 32.7 pg (ref 25.1–34.0)
MCHC: 33.1 g/dL (ref 31.5–36.0)
MCV: 99 fL (ref 79.5–101.0)
MONO%: 1.3 % (ref 0.0–14.0)
NEUT%: 90 % — ABNORMAL HIGH (ref 38.4–76.8)
RDW: 15 % — ABNORMAL HIGH (ref 11.2–14.5)
lymph#: 0.6 10*3/uL — ABNORMAL LOW (ref 0.9–3.3)

## 2012-04-18 NOTE — Telephone Encounter (Signed)
Spoke with pt and informed pt re: lab results good;  Pt can start next cycle of Revlimid  On  04/21/12 as per md.   Pt voiced understanding.

## 2012-04-30 ENCOUNTER — Other Ambulatory Visit: Payer: Self-pay | Admitting: *Deleted

## 2012-04-30 ENCOUNTER — Other Ambulatory Visit: Payer: Self-pay | Admitting: Family Medicine

## 2012-04-30 DIAGNOSIS — C9000 Multiple myeloma not having achieved remission: Secondary | ICD-10-CM

## 2012-04-30 MED ORDER — VALACYCLOVIR HCL 1 G PO TABS: 1000.0000 mg | ORAL_TABLET | ORAL | Status: DC | PRN

## 2012-05-05 ENCOUNTER — Other Ambulatory Visit: Payer: Self-pay | Admitting: *Deleted

## 2012-05-05 NOTE — Telephone Encounter (Signed)
THIS REQUEST WAS PLACED IN DR.ODOGWU'S ACTIVE WORK BIN.

## 2012-05-06 ENCOUNTER — Other Ambulatory Visit: Payer: Self-pay | Admitting: *Deleted

## 2012-05-06 MED ORDER — LENALIDOMIDE 15 MG PO CAPS: 15.0000 mg | ORAL_CAPSULE | Freq: Every day | ORAL | Status: DC

## 2012-05-09 ENCOUNTER — Ambulatory Visit (HOSPITAL_BASED_OUTPATIENT_CLINIC_OR_DEPARTMENT_OTHER): Payer: 59 | Admitting: Hematology and Oncology

## 2012-05-09 ENCOUNTER — Telehealth: Payer: Self-pay | Admitting: Hematology and Oncology

## 2012-05-09 ENCOUNTER — Encounter: Payer: Self-pay | Admitting: Hematology and Oncology

## 2012-05-09 ENCOUNTER — Other Ambulatory Visit (HOSPITAL_BASED_OUTPATIENT_CLINIC_OR_DEPARTMENT_OTHER): Payer: 59 | Admitting: Lab

## 2012-05-09 DIAGNOSIS — C9000 Multiple myeloma not having achieved remission: Secondary | ICD-10-CM

## 2012-05-09 LAB — COMPREHENSIVE METABOLIC PANEL
Alkaline Phosphatase: 56 U/L (ref 39–117)
BUN: 15 mg/dL (ref 6–23)
CO2: 24 mEq/L (ref 19–32)
Creatinine, Ser: 0.84 mg/dL (ref 0.50–1.10)
Glucose, Bld: 208 mg/dL — ABNORMAL HIGH (ref 70–99)
Total Bilirubin: 1 mg/dL (ref 0.3–1.2)
Total Protein: 6.4 g/dL (ref 6.0–8.3)

## 2012-05-09 LAB — CBC WITH DIFFERENTIAL/PLATELET
Basophils Absolute: 0.1 10*3/uL (ref 0.0–0.1)
EOS%: 0.1 % (ref 0.0–7.0)
Eosinophils Absolute: 0 10*3/uL (ref 0.0–0.5)
HGB: 12.7 g/dL (ref 11.6–15.9)
LYMPH%: 8.9 % — ABNORMAL LOW (ref 14.0–49.7)
MCH: 32.6 pg (ref 25.1–34.0)
MCV: 99.4 fL (ref 79.5–101.0)
MONO%: 2.6 % (ref 0.0–14.0)
NEUT#: 7.2 10*3/uL — ABNORMAL HIGH (ref 1.5–6.5)
NEUT%: 87.7 % — ABNORMAL HIGH (ref 38.4–76.8)
Platelets: 113 10*3/uL — ABNORMAL LOW (ref 145–400)
RDW: 15 % — ABNORMAL HIGH (ref 11.2–14.5)

## 2012-05-09 NOTE — Telephone Encounter (Signed)
Gv pt appt for may and [id number] 

## 2012-05-09 NOTE — Progress Notes (Signed)
CC:   Darlene Howard. Darlene Howard, M.D. Darlene Howard, M.D. Darlene A. Duard Brady, MD  IDENTIFYING STATEMENT:  The patient is a 61 year old woman with a myeloma who presents for followup.  INTERVAL HISTORY:  Darlene Howard was seen at [facility] at the Transplant Division for myeloma.  She had markers drawn and reports that they were stable with plans for followup in a year's time.  I have requested for those results.  She otherwise continues to well.  She has no nausea, vomiting, or pain.  Her weight is stable.  MEDICATIONS:  Revlimid 15 mg for 14 followed by a week's rest.  Decadron 12 mg weekly.  ALLERGIES:  Codeine.  PAST MEDICAL HISTORY/FAMILY HISTORY/SOCIAL HISTORY:  Unchanged.  REVIEW OF SYSTEMS:  Ten point review of systems negative.  PHYSICAL EXAMINATION:  The patient is a well-appearing, well-nourished, woman in no distress.  Vitals:  Pulse 90, blood pressure 139/78, temperature 97.6, respirations 20, weight 219.5 pounds.  HEENT:  Head is atraumatic, normocephalic.  Sclerae anicteric.  Mouth moist.  Neck: Supple.  Chest:  Clear to percussion auscultation.  CVS:  Unremarkable. Abdomen:  Soft, nontender.  Bowel sounds present.  Extremities:  No calf tenderness.  Pulses present and symmetrical.  LAB DATA:  05/09/2012, white cell count 8.2, hemoglobin 12.7, hematocrit 38.9, platelets 113.  BMET pending.  IMPRESSION AND PLAN:  Darlene Howard is a 61 year old woman with a history of a stage II IgA kappa multiple myeloma diagnosed in 2006.  She has received thalidomide with Decadron followed by an autologous transplant with high-dose melphalan on 04/26/2006 at [facility].  She had a recurrence in August 2008 and went on to receive 6 cycles of Velcade from 11/11/2007 through 03/30/2008.  She had response to therapy but as result of ongoing neuropathy was placed on maintenance low-dose Revlimid at 10 mg.  She had a biochemical recurrence with markers increasing and  thus Revlimid was increased to 25 mg with stabilization of myeloma markers.  Revlimid was then dose reduced to 15 mg for 2 weeks due to abnormal LFTs.  I am awaiting results of patient's myeloma markers, which were reported as stable from [place].  Darlene Howard otherwise continue with the current treatment plan and follows up in 6 weeks time.  Note the patient skeletal survey on 03/28/2012 showed no evidence of lytic osseous lesions suggesting multiple myeloma.    ______________________________ Laurice Record, M.D. LIO/MEDQ  D:  05/09/2012  T:  05/09/2012  Job:  [id number]

## 2012-05-09 NOTE — Patient Instructions (Signed)
Darlene Howard  [id number]  [facility] Discharge Instructions  RECOMMENDATIONS MADE BY THE CONSULTANT AND ANY TEST RESULTS WILL BE SENT TO YOUR REFERRING DOCTOR.   EXAM FINDINGS BY MD TODAY AND SIGNS AND SYMPTOMS TO REPORT TO CLINIC OR PRIMARY MD:   Your current list of medications are: Current Outpatient Prescriptions  Medication Sig Dispense Refill  . amLODipine (NORVASC) 2.5 MG tablet TAKE 1 TABLET BY MOUTH ONCE A DAY.  30 tablet  3  . aspirin (ASPIRIN LOW DOSE) 81 MG EC tablet Take 81 mg by mouth daily.        . bimatoprost (LUMIGAN) 0.03 % ophthalmic drops Place 1 drop into both eyes at bedtime.        . calcium-vitamin D (OSCAL 500/200 D-3) 500-200 MG-UNIT per tablet Take 1 tablet by mouth 3 (three) times daily.        Marland Kitchen conjugated estrogens (PREMARIN) vaginal cream Place 1 g vaginally. Insert into vaginal 3 times a week        . dexamethasone (DECADRON) 4 MG tablet Take 12 mg by mouth once a week. Take three  tablets by mouth once weekly as directed.      Marland Kitchen DIOVAN HCT 320-12.5 MG per tablet TAKE ONE TABLET BY MOUTH ONCE DAILY.  30 each  3  . FOSAMAX 70 MG tablet TAKE 1 TAB EACH WEEK 30 MIN PRIOR TO BREAKFAST WITH LARGE GLASS OF WATER. REMAIN UPRIGHT.  4 each  5  . glucose blood (ONE TOUCH ULTRA TEST) test strip Once daily testing  50 each  1  . K-DUR 20 MEQ tablet TAKE 1 TABLET BY MOUTH TWICE DAILY WITH FOOD.  60 each  3  . Lancets (ONETOUCH ULTRASOFT) lancets TEST ONCE DAILY OR AS DIRECTED.  100 each  6  . lenalidomide (REVLIMID) 15 MG capsule Take 1 capsule (15 mg total) by mouth daily. Take for 14 days, rest 7 days. Authorization  #    [id number]   05/06/12.  14 capsule  0  . metFORMIN (GLUCOPHAGE) 1000 MG tablet Take 1 tablet (1,000 mg total) by mouth 2 (two) times daily with a meal.  60 tablet  11  . Multiple Vitamins-Minerals (CENTRUM SILVER PO) Take by mouth daily.        . pantoprazole (PROTONIX) 40 MG tablet TAKE ONE TABLET BY MOUTH EVERY DAY FOR ACID REFLUX.   30 tablet  3  . terbinafine (LAMISIL) 250 MG tablet Take 1 tablet (250 mg total) by mouth daily.  42 tablet  1  . valACYclovir (VALTREX) 1000 MG tablet Take 1 tablet (1,000 mg total) by mouth as needed. Take one tablet by mouth as directed prn.  30 tablet  0  . QC LORATADINE ALLERGY RELIEF 10 MG tablet TAKE ONE TABLET BY MOUTH ONCE DAILY.  30 each  3     INSTRUCTIONS GIVEN AND DISCUSSED:   SPECIAL INSTRUCTIONS/FOLLOW-UP:  See above.  I acknowledge that I have been informed and understand all the instructions given to me and received a copy. I do not have any more questions at this time, but understand that I may call the [facility] at [phone] during business hours should I have any further questions or need assistance in obtaining follow-up care.

## 2012-05-09 NOTE — Progress Notes (Signed)
This office note has been dictated.

## 2012-05-27 ENCOUNTER — Other Ambulatory Visit: Payer: Self-pay | Admitting: *Deleted

## 2012-05-27 MED ORDER — LENALIDOMIDE 15 MG PO CAPS: 15.0000 mg | ORAL_CAPSULE | Freq: Every day | ORAL | Status: DC

## 2012-05-27 NOTE — Telephone Encounter (Signed)
THIS REQUEST WAS PLACED IN DR.ODOGWU'S ACTIVE WORK BIN. 

## 2012-05-30 ENCOUNTER — Telehealth: Payer: Self-pay | Admitting: *Deleted

## 2012-05-30 ENCOUNTER — Other Ambulatory Visit (HOSPITAL_BASED_OUTPATIENT_CLINIC_OR_DEPARTMENT_OTHER): Payer: 59 | Admitting: Lab

## 2012-05-30 DIAGNOSIS — C9000 Multiple myeloma not having achieved remission: Secondary | ICD-10-CM

## 2012-05-30 LAB — CBC WITH DIFFERENTIAL/PLATELET
Basophils Absolute: 0 10*3/uL (ref 0.0–0.1)
Eosinophils Absolute: 0 10*3/uL (ref 0.0–0.5)
HCT: 38.9 % (ref 34.8–46.6)
MCH: 32.5 pg (ref 25.1–34.0)
MCHC: 32.9 g/dL (ref 31.5–36.0)
MCV: 98.8 fL (ref 79.5–101.0)
MONO#: 0.1 10*3/uL (ref 0.1–0.9)
Platelets: 122 10*3/uL — ABNORMAL LOW (ref 145–400)
RBC: 3.93 10*6/uL (ref 3.70–5.45)
WBC: 6.2 10*3/uL (ref 3.9–10.3)

## 2012-05-30 NOTE — Telephone Encounter (Signed)
Spoke with pt on cell phone and instructed pt re:  Per Dr. Dalene Carrow,  Labs ok.  Proceed with Revlimid 15 mg po daily x 14 days for next cycle on 06/02/12.   Pt voiced understanding.

## 2012-05-31 LAB — BASIC METABOLIC PANEL
CO2: 24 mEq/L (ref 19–32)
Chloride: 104 mEq/L (ref 96–112)
Glucose, Bld: 221 mg/dL — ABNORMAL HIGH (ref 70–99)
Potassium: 4.4 mEq/L (ref 3.5–5.3)
Sodium: 139 mEq/L (ref 135–145)

## 2012-06-04 ENCOUNTER — Other Ambulatory Visit: Payer: Self-pay | Admitting: Family Medicine

## 2012-06-17 ENCOUNTER — Other Ambulatory Visit: Payer: Self-pay | Admitting: *Deleted

## 2012-06-17 DIAGNOSIS — C9000 Multiple myeloma not having achieved remission: Secondary | ICD-10-CM

## 2012-06-17 MED ORDER — LENALIDOMIDE 15 MG PO CAPS: 15.0000 mg | ORAL_CAPSULE | Freq: Every day | ORAL | Status: DC

## 2012-06-17 NOTE — Telephone Encounter (Signed)
THIS REFILL REQUEST FOR REVLIMID WAS PLACED IN DR.ODOGWU'S ACTIVE WORK BOX. 

## 2012-06-17 NOTE — Addendum Note (Signed)
Addended by: Arvilla Meres on: 06/17/2012 04:18 PM   Modules accepted: Orders

## 2012-06-20 ENCOUNTER — Other Ambulatory Visit (HOSPITAL_BASED_OUTPATIENT_CLINIC_OR_DEPARTMENT_OTHER): Payer: 59 | Admitting: Lab

## 2012-06-20 ENCOUNTER — Telehealth: Payer: Self-pay | Admitting: Hematology and Oncology

## 2012-06-20 ENCOUNTER — Encounter: Payer: Self-pay | Admitting: Hematology and Oncology

## 2012-06-20 ENCOUNTER — Ambulatory Visit (HOSPITAL_BASED_OUTPATIENT_CLINIC_OR_DEPARTMENT_OTHER): Payer: 59 | Admitting: Hematology and Oncology

## 2012-06-20 VITALS — BP 154/77 | HR 92 | Temp 96.9°F | Ht 66.0 in | Wt 220.3 lb

## 2012-06-20 DIAGNOSIS — C9000 Multiple myeloma not having achieved remission: Secondary | ICD-10-CM

## 2012-06-20 LAB — BASIC METABOLIC PANEL
CO2: 26 mEq/L (ref 19–32)
Calcium: 9.8 mg/dL (ref 8.4–10.5)
Creatinine, Ser: 0.89 mg/dL (ref 0.50–1.10)

## 2012-06-20 LAB — CBC WITH DIFFERENTIAL/PLATELET
BASO%: 0.3 % (ref 0.0–2.0)
HCT: 39.4 % (ref 34.8–46.6)
LYMPH%: 8.1 % — ABNORMAL LOW (ref 14.0–49.7)
MCH: 32.3 pg (ref 25.1–34.0)
MCHC: 32.6 g/dL (ref 31.5–36.0)
MONO#: 0.1 10*3/uL (ref 0.1–0.9)
NEUT%: 89.9 % — ABNORMAL HIGH (ref 38.4–76.8)
Platelets: 127 10*3/uL — ABNORMAL LOW (ref 145–400)
WBC: 6.8 10*3/uL (ref 3.9–10.3)

## 2012-06-20 NOTE — Progress Notes (Signed)
This office note has been dictated.

## 2012-06-20 NOTE — Telephone Encounter (Signed)
gv pt appt for [id number]

## 2012-06-20 NOTE — Patient Instructions (Signed)
Darlene Howard  [id number]  [facility] Discharge Instructions  RECOMMENDATIONS MADE BY THE CONSULTANT AND ANY TEST RESULTS WILL BE SENT TO YOUR REFERRING DOCTOR.   EXAM FINDINGS BY MD TODAY AND SIGNS AND SYMPTOMS TO REPORT TO CLINIC OR PRIMARY MD:   Your current list of medications are: Current Outpatient Prescriptions  Medication Sig Dispense Refill  . amLODipine (NORVASC) 2.5 MG tablet TAKE 1 TABLET BY MOUTH ONCE A DAY.  30 tablet  3  . aspirin (ASPIRIN LOW DOSE) 81 MG EC tablet Take 81 mg by mouth daily.        . bimatoprost (LUMIGAN) 0.03 % ophthalmic drops Place 1 drop into both eyes at bedtime.        . calcium-vitamin D (OSCAL 500/200 D-3) 500-200 MG-UNIT per tablet Take 1 tablet by mouth 3 (three) times daily.        Marland Kitchen conjugated estrogens (PREMARIN) vaginal cream Place 1 g vaginally. Insert into vaginal 3 times a week        . DIOVAN HCT 320-12.5 MG per tablet TAKE ONE TABLET BY MOUTH ONCE DAILY.  30 each  3  . FOSAMAX 70 MG tablet TAKE 1 TAB EACH WEEK 30 MIN PRIOR TO BREAKFAST WITH LARGE GLASS OF WATER. REMAIN UPRIGHT.  4 each  5  . K-DUR 20 MEQ tablet TAKE 1 TABLET BY MOUTH TWICE DAILY WITH FOOD.  60 each  3  . Lancets (ONETOUCH ULTRASOFT) lancets TEST ONCE DAILY OR AS DIRECTED.  100 each  6  . metFORMIN (GLUCOPHAGE) 1000 MG tablet Take 1 tablet (1,000 mg total) by mouth 2 (two) times daily with a meal.  60 tablet  11  . Multiple Vitamins-Minerals (CENTRUM SILVER PO) Take by mouth daily.        . ONE TOUCH ULTRA TEST test strip USE AS DIRECTED TO CHECK DAILY.  50 each  1  . pantoprazole (PROTONIX) 40 MG tablet TAKE ONE TABLET BY MOUTH EVERY DAY FOR ACID REFLUX.  30 tablet  3  . QC LORATADINE ALLERGY RELIEF 10 MG tablet TAKE ONE TABLET BY MOUTH ONCE DAILY.  30 each  3  . valACYclovir (VALTREX) 1000 MG tablet Take 1 tablet (1,000 mg total) by mouth as needed. Take one tablet by mouth as directed prn.  30 tablet  0  . dexamethasone (DECADRON) 4 MG tablet Take 12 mg  by mouth once a week. Take three  tablets by mouth once weekly as directed.      Marland Kitchen lenalidomide (REVLIMID) 15 MG capsule Take 1 capsule (15 mg total) by mouth daily.  14 capsule  0  . terbinafine (LAMISIL) 250 MG tablet Take 1 tablet (250 mg total) by mouth daily.  42 tablet  1     INSTRUCTIONS GIVEN AND DISCUSSED:   SPECIAL INSTRUCTIONS/FOLLOW-UP:  See above.  I acknowledge that I have been informed and understand all the instructions given to me and received a copy. I do not have any more questions at this time, but understand that I may call the [facility] at [phone] during business hours should I have any further questions or need assistance in obtaining follow-up care.

## 2012-06-20 NOTE — Progress Notes (Signed)
CC:   Milus Mallick. [name], M.D. Carrington Clamp, M.D. Paola A. Duard Brady, MD  IDENTIFYING STATEMENT:  The patient is a 61 year old woman with myeloma who presents for followup.  INTERVAL HISTORY:  The patient was last seen on 05/09/2012.  Continues with Revlimid.  Has no complaints.  Has good energy levels.  MEDICATIONS:  Revlimid 15 mg for 14 days followed by a week's rest. Decadron 12 mg weekly.  ALLERGIES:  Codeine.  Past medical history, family history, social history unchanged.  PHYSICAL EXAM:  Patient is alert and oriented x3.  Vitals:  Pulse 92, blood pressure 152/77, temperature 96.9, respirations 20, weight 220 pounds.  HEENT:  Head is atraumatic, normocephalic.  Sclerae anicteric. Mouth moist.  Chest/CVS:  Unremarkable.  Abdomen:  Obese, soft.  Bowel sounds present.  Extremities:  No edema.  LABS:  On 06/20/2012, white cell count 6.8, hemoglobin 12.8, hematocrit 39.4, platelets 127.  BMET pending.  IMPRESSION AND PLAN:  [name] is a 61 year old woman with a history of a stage IIA IgA kappa multiple myeloma diagnosed in 2006.  She received thalidomide with Decadron followed by autologous transplant with high- dose melphalan on 04/26/2006 at [facility].  She had a recurrence in August 2008 and went on to receive 6 cycles of Velcade from 11/11/2007 through 03/30/2008.  She had response to therapy but, as a result of ongoing neuropathy, was placed on maintenance low-dose Revlimid at 10 mg.  Had a biochemical recurrence with markers increasing and thus Revlimid was increased to 25 mg with stabilization of myeloma markers.  Revlimid was then dose-reduced to 50 mg for 2 weeks due to abnormal LFTs.  Patient's most recent myeloma markers were stable.  Darlene Howard will continue with therapy as she is currently doing.  She follows up in 12 weeks' time.    ______________________________ Laurice Record, M.D. LIO/MEDQ  D:  06/20/2012  T:  06/20/2012  Job:   [id number]

## 2012-06-23 ENCOUNTER — Ambulatory Visit (HOSPITAL_COMMUNITY)
Admission: RE | Admit: 2012-06-23 | Discharge: 2012-06-23 | Disposition: A | Discharge status: Home / Self Care | Payer: 59 | Source: Ambulatory Visit | Attending: Family Medicine | Admitting: Family Medicine

## 2012-06-23 DIAGNOSIS — Z90710 Acquired absence of both cervix and uterus: Secondary | ICD-10-CM | POA: Insufficient documentation

## 2012-06-23 DIAGNOSIS — Z13820 Encounter for screening for osteoporosis: Secondary | ICD-10-CM | POA: Insufficient documentation

## 2012-06-23 DIAGNOSIS — Z780 Asymptomatic menopausal state: Secondary | ICD-10-CM | POA: Insufficient documentation

## 2012-06-25 ENCOUNTER — Other Ambulatory Visit: Payer: 59 | Admitting: Lab

## 2012-06-27 ENCOUNTER — Other Ambulatory Visit: Payer: Self-pay | Admitting: *Deleted

## 2012-06-27 ENCOUNTER — Other Ambulatory Visit: Payer: Self-pay | Admitting: Family Medicine

## 2012-06-27 DIAGNOSIS — C9000 Multiple myeloma not having achieved remission: Secondary | ICD-10-CM

## 2012-06-27 MED ORDER — DEXAMETHASONE 4 MG PO TABS: 12.0000 mg | ORAL_TABLET | ORAL | Status: DC

## 2012-06-30 ENCOUNTER — Encounter: Payer: Self-pay | Admitting: Family Medicine

## 2012-07-01 ENCOUNTER — Other Ambulatory Visit: Payer: Self-pay | Admitting: Family Medicine

## 2012-07-04 ENCOUNTER — Other Ambulatory Visit: Payer: 59 | Admitting: Lab

## 2012-07-07 ENCOUNTER — Other Ambulatory Visit: Payer: Self-pay | Admitting: *Deleted

## 2012-07-07 DIAGNOSIS — C9000 Multiple myeloma not having achieved remission: Secondary | ICD-10-CM

## 2012-07-07 NOTE — Telephone Encounter (Signed)
THIS REFILL REQUEST FOR REVLIMID WAS PLACED IN DR.ODOGWU'S ACTIVE WORK BIN. 

## 2012-07-08 MED ORDER — LENALIDOMIDE 15 MG PO CAPS: ORAL_CAPSULE | ORAL | Status: DC

## 2012-07-08 NOTE — Addendum Note (Signed)
Addended by: Arvilla Meres on: 07/08/2012 03:36 PM   Modules accepted: Orders

## 2012-07-11 ENCOUNTER — Other Ambulatory Visit (HOSPITAL_BASED_OUTPATIENT_CLINIC_OR_DEPARTMENT_OTHER): Payer: 59 | Admitting: Lab

## 2012-07-11 ENCOUNTER — Telehealth: Payer: Self-pay | Admitting: *Deleted

## 2012-07-11 DIAGNOSIS — C9000 Multiple myeloma not having achieved remission: Secondary | ICD-10-CM

## 2012-07-11 LAB — CBC WITH DIFFERENTIAL/PLATELET
EOS%: 1.1 % (ref 0.0–7.0)
Eosinophils Absolute: 0.1 10*3/uL (ref 0.0–0.5)
MCH: 32.4 pg (ref 25.1–34.0)
MCV: 98 fL (ref 79.5–101.0)
MONO%: 7.4 % (ref 0.0–14.0)
NEUT#: 7.3 10*3/uL — ABNORMAL HIGH (ref 1.5–6.5)
RBC: 4.11 10*6/uL (ref 3.70–5.45)
RDW: 15 % — ABNORMAL HIGH (ref 11.2–14.5)
lymph#: 1.9 10*3/uL (ref 0.9–3.3)

## 2012-07-11 LAB — COMPREHENSIVE METABOLIC PANEL
ALT: 32 U/L (ref 0–35)
AST: 20 U/L (ref 0–37)
Albumin: 4.1 g/dL (ref 3.5–5.2)
Alkaline Phosphatase: 54 U/L (ref 39–117)
Potassium: 3.8 mEq/L (ref 3.5–5.3)
Sodium: 136 mEq/L (ref 135–145)
Total Protein: 6.4 g/dL (ref 6.0–8.3)

## 2012-07-11 NOTE — Telephone Encounter (Signed)
Dr. Arline Asp reviewed lab results today.   Spoke with pt on cell phone and instructed pt re:  Start new cycle of Revlimid 15 mg po on Mon 07/14/12  Daily for  14 days  As per md's instructions.   Pt voiced understanding.

## 2012-07-14 ENCOUNTER — Other Ambulatory Visit: Payer: Self-pay | Admitting: Family Medicine

## 2012-07-14 DIAGNOSIS — Z139 Encounter for screening, unspecified: Secondary | ICD-10-CM

## 2012-07-18 ENCOUNTER — Telehealth: Payer: Self-pay | Admitting: *Deleted

## 2012-07-18 LAB — LIPID PANEL
Cholesterol: 154 mg/dL (ref 0–200)
LDL Cholesterol: 75 mg/dL (ref 0–99)
Triglycerides: 128 mg/dL (ref <150)
VLDL: 26 mg/dL (ref 0–40)

## 2012-07-18 LAB — COMPLETE METABOLIC PANEL WITH GFR
ALT: 24 U/L (ref 0–35)
AST: 16 U/L (ref 0–37)
Albumin: 3.9 g/dL (ref 3.5–5.2)
BUN: 19 mg/dL (ref 6–23)
CO2: 31 mEq/L (ref 19–32)
Calcium: 9.5 mg/dL (ref 8.4–10.5)
Chloride: 106 mEq/L (ref 96–112)
GFR, Est African American: 80 mL/min
Potassium: 4.2 mEq/L (ref 3.5–5.3)

## 2012-07-18 NOTE — Telephone Encounter (Signed)
Spoke with pt today and gave pt  New date and time for lab and f/u appts on  08/15/12.   Pt voiced understanding.

## 2012-07-21 ENCOUNTER — Encounter: Payer: Self-pay | Admitting: Family Medicine

## 2012-07-21 ENCOUNTER — Ambulatory Visit (INDEPENDENT_AMBULATORY_CARE_PROVIDER_SITE_OTHER): Payer: 59 | Admitting: Family Medicine

## 2012-07-21 VITALS — BP 128/82 | HR 93 | Resp 16 | Ht 66.0 in | Wt 217.1 lb

## 2012-07-21 DIAGNOSIS — I10 Essential (primary) hypertension: Secondary | ICD-10-CM

## 2012-07-21 DIAGNOSIS — E669 Obesity, unspecified: Secondary | ICD-10-CM

## 2012-07-21 DIAGNOSIS — E119 Type 2 diabetes mellitus without complications: Secondary | ICD-10-CM

## 2012-07-21 NOTE — Patient Instructions (Addendum)
F/u in December, call if you need me before.   It is important that you exercise regularly at least 30 minutes 5 times a week. If you develop chest pain, have severe difficulty breathing, or feel very tired, stop exercising immediately and seek medical attention    A healthy diet is rich in fruit, vegetables and whole grains. Poultry fish, nuts and beans are a healthy choice for protein rather then red meat. A low sodium diet and drinking 64 ounces of water daily is generally recommended. Oils and sweet should be limited. Carbohydrates especially for those who are diabetic or overweight, should be limited to 30-45 gram per meal. It is important to eat on a regular schedule, at least 3 times daily. Snacks should be primarily fruits, vegetables or nuts.   Increase calcium with D  To three times daily  No med changes at this time

## 2012-07-22 ENCOUNTER — Ambulatory Visit (HOSPITAL_COMMUNITY)
Admission: RE | Admit: 2012-07-22 | Discharge: 2012-07-22 | Disposition: A | Discharge status: Home / Self Care | Payer: 59 | Source: Ambulatory Visit | Attending: Family Medicine | Admitting: Family Medicine

## 2012-07-22 ENCOUNTER — Other Ambulatory Visit: Payer: Self-pay

## 2012-07-22 DIAGNOSIS — N63 Unspecified lump in unspecified breast: Secondary | ICD-10-CM | POA: Insufficient documentation

## 2012-07-22 DIAGNOSIS — C9000 Multiple myeloma not having achieved remission: Secondary | ICD-10-CM

## 2012-07-22 DIAGNOSIS — Z139 Encounter for screening, unspecified: Secondary | ICD-10-CM

## 2012-07-22 DIAGNOSIS — Z1231 Encounter for screening mammogram for malignant neoplasm of breast: Secondary | ICD-10-CM | POA: Insufficient documentation

## 2012-07-22 MED ORDER — VALACYCLOVIR HCL 1 G PO TABS: 1000.0000 mg | ORAL_TABLET | ORAL | Status: DC | PRN

## 2012-07-22 MED ORDER — VALSARTAN-HYDROCHLOROTHIAZIDE 320-12.5 MG PO TABS: ORAL_TABLET | ORAL | Status: DC

## 2012-07-22 MED ORDER — PANTOPRAZOLE SODIUM 40 MG PO TBEC: DELAYED_RELEASE_TABLET | ORAL | Status: DC

## 2012-07-25 ENCOUNTER — Other Ambulatory Visit: Payer: Self-pay | Admitting: Family Medicine

## 2012-07-25 DIAGNOSIS — R928 Other abnormal and inconclusive findings on diagnostic imaging of breast: Secondary | ICD-10-CM

## 2012-07-26 NOTE — Assessment & Plan Note (Signed)
Improved. Pt applauded on succesful weight loss through lifestyle change, and encouraged to continue same. Weight loss goal set for the next several months.  

## 2012-07-26 NOTE — Assessment & Plan Note (Signed)
Controlled, no change in medication DASH diet and commitment to daily physical activity for a minimum of 30 minutes discussed and encouraged, as a part of hypertension management. The importance of attaining a healthy weight is also discussed.  

## 2012-07-26 NOTE — Progress Notes (Signed)
  Subjective:    Patient ID: Darlene Howard, female    DOB: [birth year], 61 y.o.   MRN: [id number]  HPI The PT is here for follow up and re-evaluation of chronic medical conditions, medication management and review of any available recent lab and radiology data.  Preventive health is updated, specifically  Cancer screening and Immunization.   Questions or concerns regarding consultations or procedures which the PT has had in the interim are  addressed. The PT denies any adverse reactions to current medications since the last visit.  There are no new concerns.  There are no specific complaints       Review of Systems See HPI Denies recent fever or chills. Denies sinus pressure, nasal congestion, ear pain or sore throat. Denies chest congestion, productive cough or wheezing. Denies chest pains, palpitations and leg swelling Denies abdominal pain, nausea, vomiting,diarrhea or constipation.   Denies dysuria, frequency, hesitancy or incontinence. Denies joint pain, swelling and limitation in mobility. Denies headaches, seizures, numbness, or tingling. 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        Objective:   Physical Exam Patient alert and oriented and in no cardiopulmonary distress.  HEENT: No facial asymmetry, EOMI, no sinus tenderness,  oropharynx pink and moist.  Neck supple no adenopathy.  Chest: Clear to auscultation bilaterally.  CVS: S1, S2 no murmurs, no S3.  ABD: Soft non tender. Bowel sounds normal.  Ext: No edema  MS: Adequate ROM spine, shoulders, hips and knees.  Skin: Intact, no ulcerations or rash noted.  Psych: Good eye contact, normal affect. Memory intact not anxious or depressed appearing.  CNS: CN 2-12 intact, power, tone and sensation normal throughout.        Assessment &  Plan:

## 2012-07-26 NOTE — Assessment & Plan Note (Signed)
Controlled, no change in medication  

## 2012-07-28 ENCOUNTER — Other Ambulatory Visit: Payer: Self-pay | Admitting: Family Medicine

## 2012-07-28 DIAGNOSIS — R928 Other abnormal and inconclusive findings on diagnostic imaging of breast: Secondary | ICD-10-CM

## 2012-07-30 ENCOUNTER — Ambulatory Visit
Admission: RE | Admit: 2012-07-30 | Discharge: 2012-07-30 | Disposition: A | Discharge status: Home / Self Care | Payer: 59 | Source: Ambulatory Visit | Attending: Family Medicine | Admitting: Family Medicine

## 2012-07-30 DIAGNOSIS — R928 Other abnormal and inconclusive findings on diagnostic imaging of breast: Secondary | ICD-10-CM

## 2012-07-31 ENCOUNTER — Other Ambulatory Visit: Payer: Self-pay | Admitting: Nurse Practitioner

## 2012-07-31 DIAGNOSIS — C9000 Multiple myeloma not having achieved remission: Secondary | ICD-10-CM

## 2012-07-31 MED ORDER — LENALIDOMIDE 15 MG PO CAPS: ORAL_CAPSULE | ORAL | Status: DC

## 2012-08-01 ENCOUNTER — Other Ambulatory Visit (HOSPITAL_BASED_OUTPATIENT_CLINIC_OR_DEPARTMENT_OTHER): Payer: 59 | Admitting: Lab

## 2012-08-01 ENCOUNTER — Telehealth: Payer: Self-pay | Admitting: Nurse Practitioner

## 2012-08-01 ENCOUNTER — Other Ambulatory Visit: Payer: Self-pay | Admitting: Nurse Practitioner

## 2012-08-01 DIAGNOSIS — C9000 Multiple myeloma not having achieved remission: Secondary | ICD-10-CM

## 2012-08-01 LAB — COMPREHENSIVE METABOLIC PANEL
AST: 18 U/L (ref 0–37)
Albumin: 3.7 g/dL (ref 3.5–5.2)
Alkaline Phosphatase: 47 U/L (ref 39–117)
BUN: 11 mg/dL (ref 6–23)
Potassium: 3.7 mEq/L (ref 3.5–5.3)
Sodium: 140 mEq/L (ref 135–145)

## 2012-08-01 LAB — CBC WITH DIFFERENTIAL/PLATELET
Basophils Absolute: 0 10*3/uL (ref 0.0–0.1)
EOS%: 1.5 % (ref 0.0–7.0)
MCH: 32.4 pg (ref 25.1–34.0)
MCHC: 33 g/dL (ref 31.5–36.0)
MCV: 98.3 fL (ref 79.5–101.0)
MONO%: 9.2 % (ref 0.0–14.0)
RBC: 3.74 10*6/uL (ref 3.70–5.45)
RDW: 14.9 % — ABNORMAL HIGH (ref 11.2–14.5)

## 2012-08-01 MED ORDER — LENALIDOMIDE 15 MG PO CAPS: ORAL_CAPSULE | ORAL | Status: DC

## 2012-08-04 NOTE — Telephone Encounter (Signed)
Error

## 2012-08-15 ENCOUNTER — Other Ambulatory Visit (HOSPITAL_BASED_OUTPATIENT_CLINIC_OR_DEPARTMENT_OTHER): Payer: 59 | Admitting: Lab

## 2012-08-15 ENCOUNTER — Ambulatory Visit (HOSPITAL_BASED_OUTPATIENT_CLINIC_OR_DEPARTMENT_OTHER): Payer: 59 | Admitting: Family

## 2012-08-15 ENCOUNTER — Encounter: Payer: Self-pay | Admitting: Family

## 2012-08-15 ENCOUNTER — Other Ambulatory Visit: Payer: 59 | Admitting: Lab

## 2012-08-15 ENCOUNTER — Telehealth: Payer: Self-pay | Admitting: Hematology and Oncology

## 2012-08-15 VITALS — BP 128/71 | HR 89 | Temp 97.0°F | Resp 20 | Ht 66.0 in | Wt 217.1 lb

## 2012-08-15 DIAGNOSIS — C9000 Multiple myeloma not having achieved remission: Secondary | ICD-10-CM

## 2012-08-15 LAB — COMPREHENSIVE METABOLIC PANEL
AST: 27 U/L (ref 0–37)
Alkaline Phosphatase: 50 U/L (ref 39–117)
BUN: 11 mg/dL (ref 6–23)
Creatinine, Ser: 0.74 mg/dL (ref 0.50–1.10)
Potassium: 3.7 mEq/L (ref 3.5–5.3)
Total Bilirubin: 1.1 mg/dL (ref 0.3–1.2)

## 2012-08-15 LAB — CBC WITH DIFFERENTIAL/PLATELET
Basophils Absolute: 0 10*3/uL (ref 0.0–0.1)
EOS%: 1.2 % (ref 0.0–7.0)
HGB: 12.8 g/dL (ref 11.6–15.9)
LYMPH%: 11.3 % — ABNORMAL LOW (ref 14.0–49.7)
MCH: 32.8 pg (ref 25.1–34.0)
MCV: 98.8 fL (ref 79.5–101.0)
MONO%: 12 % (ref 0.0–14.0)
NEUT%: 75.2 % (ref 38.4–76.8)
RDW: 15.3 % — ABNORMAL HIGH (ref 11.2–14.5)

## 2012-08-15 NOTE — Progress Notes (Signed)
Patient ID: Darlene Howard, female   DOB: 1951-03-29, 61 y.o.   MRN: [id number] CSN: [id number]   CC: Milus Mallick. [name], M.D.  Carrington Clamp, M.D.  Paola A. Duard Brady, MD   Identifying Statement: Darlene Howard is a 61 y.o. African-American female with a history of multiple myeloma who presents for follow-up.  Interval History: The patient was last seen on 06/20/2012.  She continues with Revlimid - a 14 day on , 7 day off cycle.  The patient is without complaint today and states that the neuropathy in her hands and feet have improved.  Darlene Howard has good energy levels.  She denies any symptomatology or concerns.    Medications: Current Outpatient Prescriptions  Medication Sig Dispense Refill  . aspirin (ASPIRIN LOW DOSE) 81 MG EC tablet Take 81 mg by mouth daily.        . bimatoprost (LUMIGAN) 0.03 % ophthalmic drops Place 1 drop into both eyes at bedtime.        . calcium-vitamin D (OSCAL 500/200 D-3) 500-200 MG-UNIT per tablet Take 1 tablet by mouth 3 (three) times daily.        Marland Kitchen conjugated estrogens (PREMARIN) vaginal cream Place 1 g vaginally. Insert into vaginal 3 times a week        . dexamethasone (DECADRON) 4 MG tablet Take 3 tablets (12 mg total) by mouth once a week. Take three  tablets by mouth once weekly as directed.  20 tablet  2  . DIOVAN HCT 320-12.5 MG per tablet TAKE ONE TABLET BY MOUTH ONCE DAILY.  30 each  3  . dorzolamide-timolol (COSOPT) 22.3-6.8 MG/ML ophthalmic solution Place 1 drop into both eyes 2 (two) times daily.      Marland Kitchen FOSAMAX 70 MG tablet TAKE 1 TAB EACH WEEK 30 MIN PRIOR TO BREAKFAST WITH LARGE GLASS OF WATER. REMAIN UPRIGHT.  4 each  5  . GLUCOPHAGE 1000 MG tablet TAKE 1 TABLET BY MOUTH TWICE DAILY.  60 each  3  . K-DUR 20 MEQ tablet TAKE 1 TABLET BY MOUTH TWICE DAILY.  60 each  3  . Lancets (ONETOUCH ULTRASOFT) lancets TEST ONCE DAILY OR AS DIRECTED.  100 each  6  . lenalidomide (REVLIMID) 15 MG capsule Take one capsule daily for 14 days.  Then 7 days  off.  14 capsule  0  . Multiple Vitamins-Minerals (CENTRUM SILVER PO) Take by mouth daily.        . NORVASC 2.5 MG tablet TAKE 1 TABLET BY MOUTH ONCE A DAY.  30 each  3  . ONE TOUCH ULTRA TEST test strip USE AS DIRECTED TO CHECK DAILY.  50 each  1  . pantoprazole (PROTONIX) 40 MG tablet TAKE ONE TABLET BY MOUTH EVERY DAY FOR ACID REFLUX.  30 tablet  5  . QC LORATADINE ALLERGY RELIEF 10 MG tablet TAKE ONE TABLET BY MOUTH ONCE DAILY.  30 each  3  . valACYclovir (VALTREX) 1000 MG tablet Take 1 tablet (1,000 mg total) by mouth as needed. Take one tablet by mouth as directed prn.  30 tablet  0    Allergies: Allergies  Allergen Reactions  . Codeine     Family History: Family History  Problem Relation Age of Onset  . Kidney failure Mother   . Hypertension Mother   . Prostate cancer Father   . Hypertension Father     vascular disease   . Stroke Father   . Hypertension Sister   . Hypertension Sister   .  Hypertension Brother   . Hyperlipidemia Brother     Social History: History  Substance Use Topics  . Smoking status: Never Smoker   . Smokeless tobacco: Never Used  . Alcohol Use: No    Review of Systems: 10 point review of systems was completed and is negative.   Physical Exam: Blood pressure 128/71, pulse 89, temperature 97 F (36.1 C), temperature source Oral, resp. rate 20, height 5\' 6"  (1.676 m), weight 217 lb 1.6 oz (98.476 kg).  General appearance: Alert, cooperative, well nourished, no apparent distress Head: Normocephalic, without obvious abnormality, atraumatic Eyes: Conjunctivae/corneas clear, PERRLA, EOMI, non-icteric sclerae Nose: Nares, septum and mucosa are normal, no drainage or sinus tenderness Throat: Lips, mucosa, tongue, teeth and gums are normal Back: Symmetric, no curvature, normal ROM, no CVA tenderness Resp: Diminished bibasilar breath sounds, CTA Cardio: Regular rate and rhythm, S1, S2 normal, no murmur, click, rub or gallop GI: Soft, non-tender,  distended, hypoactive bowel sounds, no organomegaly Extremities: Extremities normal, atraumatic, no cyanosis or edema Lymph nodes: Cervical, supraclavicular, and axillary nodes normal Neurologic: Grossly normal   Laboratory Data: Results for orders placed in visit on 08/15/12 (from the past 48 hour(s))  CBC WITH DIFFERENTIAL     Status: Abnormal   Collection Time   08/15/12 11:47 AM      Component Value Range Comment   WBC 7.6  3.9 - 10.3 10e3/uL    NEUT# 5.7  1.5 - 6.5 10e3/uL    HGB 12.8  11.6 - 15.9 g/dL    HCT 65.7  84.6 - 96.2 %    Platelets 154  145 - 400 10e3/uL    MCV 98.8  79.5 - 101.0 fL    MCH 32.8  25.1 - 34.0 pg    MCHC 33.1  31.5 - 36.0 g/dL    RBC 9.52  8.41 - 3.24 10e6/uL    RDW 15.3 (*) 11.2 - 14.5 %    lymph# 0.9  0.9 - 3.3 10e3/uL    MONO# 0.9  0.1 - 0.9 10e3/uL    Eosinophils Absolute 0.1  0.0 - 0.5 10e3/uL    Basophils Absolute 0.0  0.0 - 0.1 10e3/uL    NEUT% 75.2  38.4 - 76.8 %    LYMPH% 11.3 (*) 14.0 - 49.7 %    MONO% 12.0  0.0 - 14.0 %    EOS% 1.2  0.0 - 7.0 %    BASO% 0.3  0.0 - 2.0 %     Impression/Plan: Darlene Howard is a 61 year old woman with a history of a stage IIA IgA kappa multiple myeloma diagnosed in 2006. She received  thalidomide with Decadron followed by autologous transplant with high-dose melphalan on 04/26/2006 at [facility]. She had a recurrence in August 2008 and went on to receive 6 cycles of Velcade from 11/11/2007 through 03/30/2008. She had response to therapy but as a result of ongoing neuropathy, she was placed on maintenance low-dose Revlimid at 10 mg. She then had a biochemical recurrence with markers increasing and thus Revlimid was increased to 25 mg with stabilization of myeloma markers. Revlimid was then dose-reduced to 15 mg PO daily for the 2 weeks cycle due to abnormal LFTs.  The patient is currently receiving Revlimid 15 mg PO daily for two weeks on, one week off in addition to Decadron, 12 mg PO weekly. The  patient's most recent myeloma markers were stable with this regimen.  She has a follow-up office visit scheduled with Dr. Dalene Carrow in 12  weeks' time and she continues to have laboratories drawn every 3 weeks before each Revlimid cycle.  Layci Stenglein NP-C 08/15/2012, 1:18 PM

## 2012-08-15 NOTE — Telephone Encounter (Signed)
Gave pt appt calendar for August, September , October and November 2013 lab and MD

## 2012-08-15 NOTE — Patient Instructions (Addendum)
Patient ID: Darlene Howard,   DOB: [birth year],  MRN: [id number]   [facility] Discharge Instructions  RECOMMENDATIONS MAD BY THE CONSULTANT AND ANY TEST RESULT(S) WILL BE FORWARDED TO YOU REFERRING DOCTOR   EXAM FINDINGS BY NURSE PRACTITIONER TODAY TO REPORT TO THE CLINIC OR PRIMARY PROVIDER:    Your Current Medications Are: Current Outpatient Prescriptions  Medication Sig Dispense Refill  . aspirin (ASPIRIN LOW DOSE) 81 MG EC tablet Take 81 mg by mouth daily.        . bimatoprost (LUMIGAN) 0.03 % ophthalmic drops Place 1 drop into both eyes at bedtime.        . calcium-vitamin D (OSCAL 500/200 D-3) 500-200 MG-UNIT per tablet Take 1 tablet by mouth 3 (three) times daily.        Marland Kitchen conjugated estrogens (PREMARIN) vaginal cream Place 1 g vaginally. Insert into vaginal 3 times a week        . dexamethasone (DECADRON) 4 MG tablet Take 3 tablets (12 mg total) by mouth once a week. Take three  tablets by mouth once weekly as directed.  20 tablet  2  . DIOVAN HCT 320-12.5 MG per tablet TAKE ONE TABLET BY MOUTH ONCE DAILY.  30 each  3  . dorzolamide-timolol (COSOPT) 22.3-6.8 MG/ML ophthalmic solution Place 1 drop into both eyes 2 (two) times daily.      Marland Kitchen FOSAMAX 70 MG tablet TAKE 1 TAB EACH WEEK 30 MIN PRIOR TO BREAKFAST WITH LARGE GLASS OF WATER. REMAIN UPRIGHT.  4 each  5  . GLUCOPHAGE 1000 MG tablet TAKE 1 TABLET BY MOUTH TWICE DAILY.  60 each  3  . K-DUR 20 MEQ tablet TAKE 1 TABLET BY MOUTH TWICE DAILY.  60 each  3  . Lancets (ONETOUCH ULTRASOFT) lancets TEST ONCE DAILY OR AS DIRECTED.  100 each  6  . lenalidomide (REVLIMID) 15 MG capsule Take one capsule daily for 14 days.  Then 7 days off.  14 capsule  0  . Multiple Vitamins-Minerals (CENTRUM SILVER PO) Take by mouth daily.        . NORVASC 2.5 MG tablet TAKE 1 TABLET BY MOUTH ONCE A DAY.  30 each  3  . ONE TOUCH ULTRA TEST test strip USE AS DIRECTED TO CHECK DAILY.  50 each  1  . pantoprazole (PROTONIX) 40 MG tablet TAKE ONE  TABLET BY MOUTH EVERY DAY FOR ACID REFLUX.  30 tablet  5  . QC LORATADINE ALLERGY RELIEF 10 MG tablet TAKE ONE TABLET BY MOUTH ONCE DAILY.  30 each  3  . valACYclovir (VALTREX) 1000 MG tablet Take 1 tablet (1,000 mg total) by mouth as needed. Take one tablet by mouth as directed prn.  30 tablet  0     INSTRUCTIONS GIVEN, DISCUSSED AND FOLLOW-UP: Keep up the great work!!!  I acknowledge that I have been informed and understand all the instructions given to me and have received a copy.  I do not have any further questions at this time, but I understand that I may call the [facility] at [phone] during business hours should I have any further questions or need assistance in obtaining follow-up care.   08/15/2012, 1:39 PM

## 2012-08-18 ENCOUNTER — Telehealth: Payer: Self-pay | Admitting: *Deleted

## 2012-08-18 NOTE — Telephone Encounter (Signed)
Request for revlimid received.  To MD for review.

## 2012-08-20 ENCOUNTER — Other Ambulatory Visit: Payer: Self-pay | Admitting: Nurse Practitioner

## 2012-08-20 ENCOUNTER — Other Ambulatory Visit: Payer: Self-pay | Admitting: Family Medicine

## 2012-08-20 DIAGNOSIS — C9000 Multiple myeloma not having achieved remission: Secondary | ICD-10-CM

## 2012-08-20 MED ORDER — LENALIDOMIDE 15 MG PO CAPS: ORAL_CAPSULE | ORAL | Status: DC

## 2012-08-22 ENCOUNTER — Other Ambulatory Visit (HOSPITAL_BASED_OUTPATIENT_CLINIC_OR_DEPARTMENT_OTHER): Payer: 59 | Admitting: Lab

## 2012-08-22 ENCOUNTER — Telehealth: Payer: Self-pay | Admitting: *Deleted

## 2012-08-22 DIAGNOSIS — C9000 Multiple myeloma not having achieved remission: Secondary | ICD-10-CM

## 2012-08-22 LAB — COMPREHENSIVE METABOLIC PANEL
AST: 16 U/L (ref 0–37)
Alkaline Phosphatase: 52 U/L (ref 39–117)
BUN: 8 mg/dL (ref 6–23)
Calcium: 9.7 mg/dL (ref 8.4–10.5)
Chloride: 104 mEq/L (ref 96–112)
Creatinine, Ser: 0.78 mg/dL (ref 0.50–1.10)
Glucose, Bld: 116 mg/dL — ABNORMAL HIGH (ref 70–99)

## 2012-08-22 LAB — CBC WITH DIFFERENTIAL/PLATELET
Basophils Absolute: 0 10*3/uL (ref 0.0–0.1)
EOS%: 1.2 % (ref 0.0–7.0)
HCT: 37.6 % (ref 34.8–46.6)
HGB: 12.5 g/dL (ref 11.6–15.9)
MCH: 33 pg (ref 25.1–34.0)
MCV: 99.4 fL (ref 79.5–101.0)
MONO%: 8.1 % (ref 0.0–14.0)
NEUT%: 75.5 % (ref 38.4–76.8)
Platelets: 115 10*3/uL — ABNORMAL LOW (ref 145–400)

## 2012-08-22 NOTE — Telephone Encounter (Signed)
Dr. Dalene Carrow reviewed lab results today.   Spoke with pt on cell phone and instructed pt re:  OK to start new cycle of Revlimid 15 mg daily on 08/25/12.   Pt voiced understanding.

## 2012-09-01 ENCOUNTER — Other Ambulatory Visit: Payer: Self-pay | Admitting: Family Medicine

## 2012-09-08 ENCOUNTER — Other Ambulatory Visit: Payer: Self-pay | Admitting: *Deleted

## 2012-09-08 DIAGNOSIS — C9000 Multiple myeloma not having achieved remission: Secondary | ICD-10-CM

## 2012-09-08 NOTE — Telephone Encounter (Signed)
THIS REFILL REQUEST FOR REVLIMID WAS PLACED IN DR.ODOGWU'S ACTIVE WORK BIN. 

## 2012-09-09 MED ORDER — LENALIDOMIDE 15 MG PO CAPS: ORAL_CAPSULE | ORAL | Status: DC

## 2012-09-09 NOTE — Addendum Note (Signed)
Addended by: Arvilla Meres on: 09/09/2012 02:18 PM   Modules accepted: Orders

## 2012-09-12 ENCOUNTER — Other Ambulatory Visit (HOSPITAL_BASED_OUTPATIENT_CLINIC_OR_DEPARTMENT_OTHER): Payer: 59 | Admitting: Lab

## 2012-09-12 DIAGNOSIS — C9000 Multiple myeloma not having achieved remission: Secondary | ICD-10-CM

## 2012-09-12 LAB — CBC WITH DIFFERENTIAL/PLATELET
Basophils Absolute: 0 10*3/uL (ref 0.0–0.1)
EOS%: 1.8 % (ref 0.0–7.0)
HCT: 38.2 % (ref 34.8–46.6)
HGB: 12.7 g/dL (ref 11.6–15.9)
MCH: 32.9 pg (ref 25.1–34.0)
MCV: 98.9 fL (ref 79.5–101.0)
MONO%: 9.3 % (ref 0.0–14.0)
NEUT%: 73.5 % (ref 38.4–76.8)

## 2012-09-12 LAB — COMPREHENSIVE METABOLIC PANEL (CC13)
AST: 19 U/L (ref 5–34)
Alkaline Phosphatase: 58 U/L (ref 40–150)
BUN: 14 mg/dL (ref 7.0–26.0)
Creatinine: 0.9 mg/dL (ref 0.6–1.1)

## 2012-09-16 ENCOUNTER — Telehealth: Payer: Self-pay | Admitting: *Deleted

## 2012-09-16 NOTE — Telephone Encounter (Signed)
Spoke with pt today and instructed pt re:  OK to start Revlimid today as per md.   Pt voiced understanding.

## 2012-09-29 ENCOUNTER — Other Ambulatory Visit: Payer: Self-pay | Admitting: Family Medicine

## 2012-09-30 ENCOUNTER — Other Ambulatory Visit: Payer: Self-pay | Admitting: *Deleted

## 2012-09-30 DIAGNOSIS — C9000 Multiple myeloma not having achieved remission: Secondary | ICD-10-CM

## 2012-09-30 MED ORDER — LENALIDOMIDE 15 MG PO CAPS: ORAL_CAPSULE | ORAL | Status: DC

## 2012-10-03 ENCOUNTER — Other Ambulatory Visit (HOSPITAL_BASED_OUTPATIENT_CLINIC_OR_DEPARTMENT_OTHER): Payer: 59 | Admitting: Lab

## 2012-10-03 ENCOUNTER — Telehealth: Payer: Self-pay | Admitting: *Deleted

## 2012-10-03 DIAGNOSIS — C9000 Multiple myeloma not having achieved remission: Secondary | ICD-10-CM

## 2012-10-03 LAB — COMPREHENSIVE METABOLIC PANEL (CC13)
ALT: 38 U/L (ref 0–55)
AST: 17 U/L (ref 5–34)
Albumin: 3.5 g/dL (ref 3.5–5.0)
BUN: 13 mg/dL (ref 7.0–26.0)
Chloride: 105 mEq/L (ref 98–107)
Creatinine: 0.8 mg/dL (ref 0.6–1.1)
Total Protein: 6.4 g/dL (ref 6.4–8.3)

## 2012-10-03 LAB — CBC WITH DIFFERENTIAL/PLATELET
Basophils Absolute: 0 10*3/uL (ref 0.0–0.1)
EOS%: 1.8 % (ref 0.0–7.0)
HCT: 39.5 % (ref 34.8–46.6)
HGB: 13.2 g/dL (ref 11.6–15.9)
MCH: 33.3 pg (ref 25.1–34.0)
MCV: 99.6 fL (ref 79.5–101.0)
MONO%: 9.4 % (ref 0.0–14.0)
NEUT%: 72.2 % (ref 38.4–76.8)
lymph#: 1.4 10*3/uL (ref 0.9–3.3)

## 2012-10-03 NOTE — Telephone Encounter (Signed)
Spoke with pt and informed pt re:  Per Dr. Dalene Carrow,  OK to start new cycle of Revlimid 15 mg po daily  X  14  Days  On  10/07/12.   Pt voiced understanding.

## 2012-10-22 ENCOUNTER — Other Ambulatory Visit: Payer: Self-pay | Admitting: *Deleted

## 2012-10-22 DIAGNOSIS — C9000 Multiple myeloma not having achieved remission: Secondary | ICD-10-CM

## 2012-10-22 MED ORDER — LENALIDOMIDE 15 MG PO CAPS: ORAL_CAPSULE | ORAL | Status: DC

## 2012-10-24 ENCOUNTER — Other Ambulatory Visit (HOSPITAL_BASED_OUTPATIENT_CLINIC_OR_DEPARTMENT_OTHER): Payer: 59 | Admitting: Lab

## 2012-10-24 DIAGNOSIS — C9000 Multiple myeloma not having achieved remission: Secondary | ICD-10-CM

## 2012-10-24 LAB — CBC WITH DIFFERENTIAL/PLATELET
BASO%: 0.3 % (ref 0.0–2.0)
EOS%: 1.1 % (ref 0.0–7.0)
LYMPH%: 16.2 % (ref 14.0–49.7)
MCHC: 33 g/dL (ref 31.5–36.0)
MCV: 100.2 fL (ref 79.5–101.0)
MONO%: 9.3 % (ref 0.0–14.0)
Platelets: 119 10*3/uL — ABNORMAL LOW (ref 145–400)
RBC: 3.66 10*6/uL — ABNORMAL LOW (ref 3.70–5.45)
RDW: 15.1 % — ABNORMAL HIGH (ref 11.2–14.5)

## 2012-10-24 LAB — COMPREHENSIVE METABOLIC PANEL (CC13)
ALT: 28 U/L (ref 0–55)
AST: 17 U/L (ref 5–34)
Albumin: 3.5 g/dL (ref 3.5–5.0)
Alkaline Phosphatase: 52 U/L (ref 40–150)
BUN: 12 mg/dL (ref 7.0–26.0)
Creatinine: 0.9 mg/dL (ref 0.6–1.1)
Potassium: 4 mEq/L (ref 3.5–5.1)

## 2012-10-27 ENCOUNTER — Ambulatory Visit (INDEPENDENT_AMBULATORY_CARE_PROVIDER_SITE_OTHER): Payer: 59

## 2012-10-27 DIAGNOSIS — Z23 Encounter for immunization: Secondary | ICD-10-CM

## 2012-10-28 ENCOUNTER — Telehealth: Payer: Self-pay | Admitting: Nurse Practitioner

## 2012-10-28 NOTE — Telephone Encounter (Signed)
Spoke with patient.  Informed per Dr. Dalene Carrow- labs ok- start Revlimid today.  Pt verbalized understanding.

## 2012-10-29 ENCOUNTER — Other Ambulatory Visit: Payer: Self-pay | Admitting: Family Medicine

## 2012-11-05 ENCOUNTER — Other Ambulatory Visit: Payer: Self-pay | Admitting: Family Medicine

## 2012-11-06 ENCOUNTER — Encounter: Payer: Self-pay | Admitting: Hematology and Oncology

## 2012-11-06 ENCOUNTER — Ambulatory Visit (HOSPITAL_BASED_OUTPATIENT_CLINIC_OR_DEPARTMENT_OTHER): Payer: 59 | Admitting: Hematology and Oncology

## 2012-11-06 ENCOUNTER — Telehealth: Payer: Self-pay | Admitting: Hematology and Oncology

## 2012-11-06 ENCOUNTER — Other Ambulatory Visit (HOSPITAL_BASED_OUTPATIENT_CLINIC_OR_DEPARTMENT_OTHER): Payer: 59 | Admitting: Lab

## 2012-11-06 VITALS — BP 145/79 | HR 78 | Temp 97.4°F | Resp 20 | Ht 66.0 in | Wt 215.7 lb

## 2012-11-06 DIAGNOSIS — C9000 Multiple myeloma not having achieved remission: Secondary | ICD-10-CM

## 2012-11-06 LAB — COMPREHENSIVE METABOLIC PANEL (CC13)
AST: 18 U/L (ref 5–34)
Alkaline Phosphatase: 52 U/L (ref 40–150)
BUN: 12 mg/dL (ref 7.0–26.0)
Creatinine: 0.8 mg/dL (ref 0.6–1.1)
Total Bilirubin: 1.22 mg/dL — ABNORMAL HIGH (ref 0.20–1.20)

## 2012-11-06 LAB — CBC WITH DIFFERENTIAL/PLATELET
Basophils Absolute: 0 10*3/uL (ref 0.0–0.1)
EOS%: 1.7 % (ref 0.0–7.0)
HCT: 37.9 % (ref 34.8–46.6)
HGB: 12.5 g/dL (ref 11.6–15.9)
MCH: 33 pg (ref 25.1–34.0)
MCHC: 33 g/dL (ref 31.5–36.0)
MCV: 100.1 fL (ref 79.5–101.0)
MONO%: 9.8 % (ref 0.0–14.0)
NEUT%: 73.7 % (ref 38.4–76.8)

## 2012-11-06 NOTE — Patient Instructions (Addendum)
Demari Shearon Stalls  [id number]   [facility] - AFTER VISIT SUMMARY   **RECOMMENDATIONS MADE BY THE CONSULTANT AND ANY TEST    RESULTS WILL BE SENT TO YOUR REFERRING DOCTORS.   YOUR EXAM FINDINGS, LABS AND RESULTS WERE DISCUSSED BY YOUR MD TODAY.  YOU CAN GO TO THE [place] WEB SITE FOR INSTRUCTIONS ON HOW TO ASSESS MY CHART FOR ADDITIONAL INFORMATION AS NEEDED.  Your Updated drug allergies are: Allergies as of 11/06/2012 - Review Complete 08/15/2012  Allergen Reaction Noted  . Codeine  05/03/2008    Your current list of medications are: Current Outpatient Prescriptions  Medication Sig Dispense Refill  . aspirin (ASPIRIN LOW DOSE) 81 MG EC tablet Take 81 mg by mouth daily.        . bimatoprost (LUMIGAN) 0.03 % ophthalmic drops Place 1 drop into both eyes at bedtime.        . calcium-vitamin D (OSCAL 500/200 D-3) 500-200 MG-UNIT per tablet Take 1 tablet by mouth 3 (three) times daily.        Marland Kitchen conjugated estrogens (PREMARIN) vaginal cream Place 1 g vaginally. Insert into vaginal 3 times a week        . dexamethasone (DECADRON) 4 MG tablet Take 3 tablets (12 mg total) by mouth once a week. Take three  tablets by mouth once weekly as directed.  20 tablet  2  . DIOVAN HCT 320-12.5 MG per tablet TAKE ONE TABLET BY MOUTH ONCE DAILY.  30 each  3  . dorzolamide-timolol (COSOPT) 22.3-6.8 MG/ML ophthalmic solution Place 1 drop into both eyes 2 (two) times daily.      Marland Kitchen FOSAMAX 70 MG tablet TAKE 1 TAB EACH WEEK 30 MIN PRIOR TO BREAKFAST WITH LARGE GLASS OF WATER. REMAIN UPRIGHT.  4 each  3  . GLUCOPHAGE 1000 MG tablet TAKE 1 TABLET BY MOUTH TWICE DAILY.  60 tablet  4  . K-DUR 20 MEQ tablet TAKE 1 TABLET BY MOUTH TWICE DAILY.  60 tablet  4  . Lancets (ONETOUCH ULTRASOFT) lancets TEST ONCE DAILY OR AS DIRECTED.  100 each  6  . lenalidomide (REVLIMID) 15 MG capsule Take one capsule daily for 14 days.  Then 7 days off.  14 capsule  0  . Multiple Vitamins-Minerals (CENTRUM SILVER PO)  Take by mouth daily.        . NORVASC 2.5 MG tablet TAKE 1 TABLET BY MOUTH ONCE A DAY.  30 tablet  4  . ONE TOUCH ULTRA TEST test strip USE AS DIRECTED TO CHECK DAILY.  50 each  1  . pantoprazole (PROTONIX) 40 MG tablet TAKE ONE TABLET BY MOUTH EVERY DAY FOR ACID REFLUX.  30 tablet  5  . QC LORATADINE ALLERGY RELIEF 10 MG tablet TAKE ONE TABLET BY MOUTH ONCE DAILY.  30 each  3  . VALTREX 1 G tablet TAKE ONE TABLET BY MOUTH AS NEEDED AS DIRECTED.  30 tablet  0     INSTRUCTIONS GIVEN AND DISCUSSED:  See attached schedule   SPECIAL INSTRUCTIONS/FOLLOW-UP:  See above.  I acknowledge that I have been informed and understand all the instructions given to me and received a copy.I know to contact the clinic, my physician, or go to the emergency Department if any problems should occur.   I do not have any more questions at this time, but understand that I may call the [facility] at [phone] during business hours should I have any further questions or need  assistance in obtaining follow-up care.

## 2012-11-06 NOTE — Progress Notes (Signed)
This office note has been dictated.

## 2012-11-06 NOTE — Telephone Encounter (Signed)
gv and printed pt appt schedule for NOV, Dec, and Jan 2014

## 2012-11-07 ENCOUNTER — Ambulatory Visit: Payer: 59 | Admitting: Hematology and Oncology

## 2012-11-07 NOTE — Progress Notes (Signed)
CC:   Milus Mallick. [name], M.D. Carrington Clamp, M.D. Paola A. Duard Brady, MD  IDENTIFYING STATEMENT:  The patient is a 61 year old woman and with multiple myeloma who presents for followup.  INTERVAL HISTORY:  The patient continues on Revlimid.  Has no current issues or concerns.  She is not fatigued.  Has no nausea, vomiting, or GI complaints.  Denies bruising or bleeding.  MEDICATIONS:  Revlimid 15 mg daily for 14 days followed by a week off. Decadron 12 mg weekly.  PHYSICAL EXAMINATION:  Patient is alert and oriented x3.  Vitals:  Pulse 78, blood pressure 145/79, temperature 97.4, respirations 20, weight 215 pounds.  HEENT:  Head is atraumatic, normocephalic.  Sclerae anicteric. Mouth moist.  Chest:  Clear.  Abdomen:  Obese soft.  Extremities:  No calf tenderness.  LABORATORY DATA:  On 11/06/2012:  White cell count 6.9, hemoglobin 12.5 hematocrit 37.9, platelets 158.  BMET pending.  IMPRESSION AND PLAN:  Ms. Freeburg is a 61 year old woman with a history of a stage IIA IgA kappa multiple myeloma diagnosed in 2006.  She is status post thalidomide and Decadron followed by autologous transplant with high-dose melphalan on 04/26/2006 at [facility].  She had a recurrence in August 2008 and went on to receive 6 cycles of Velcade from 11/11/2007 through 03/30/2008.  She had response to therapy but as a result of ongoing neuropathy was placed on maintenance low-dose Revlimid at 10 mg.  She had a biochemical recurrence and thus Revlimid was increased to 25 mg with stabilization of myeloma markers.  Revlimid was dose-reduced to 15 mg daily for 2 weeks followed by a week off due to abnormal LFTs.  She remains on the present dose.  Her labs are stable.  Regards to her steroids which she receives weekly she is currently on 12 mg p.o. weekly.  I will further dose reduced Decadron to 4 mg weekly.  She will continue with Revlimid at its current dose and schedule.  Follows up in 12 weeks.  She  was reminded to receive annual flu vaccine and, if she has not done so, pneumonia given every 5 years.    ______________________________ Laurice Record, M.D. LIO/MEDQ  D:  11/06/2012  T:  11/07/2012  Job:  [id number]

## 2012-11-12 ENCOUNTER — Other Ambulatory Visit: Payer: Self-pay | Admitting: Nurse Practitioner

## 2012-11-12 DIAGNOSIS — C9000 Multiple myeloma not having achieved remission: Secondary | ICD-10-CM

## 2012-11-12 MED ORDER — LENALIDOMIDE 15 MG PO CAPS: ORAL_CAPSULE | ORAL | Status: DC

## 2012-11-14 ENCOUNTER — Other Ambulatory Visit (HOSPITAL_BASED_OUTPATIENT_CLINIC_OR_DEPARTMENT_OTHER): Payer: 59 | Admitting: Lab

## 2012-11-14 DIAGNOSIS — C9000 Multiple myeloma not having achieved remission: Secondary | ICD-10-CM

## 2012-11-14 LAB — COMPREHENSIVE METABOLIC PANEL (CC13)
ALT: 37 U/L (ref 0–55)
CO2: 27 mEq/L (ref 22–29)
Calcium: 9.7 mg/dL (ref 8.4–10.4)
Chloride: 107 mEq/L (ref 98–107)
Creatinine: 0.8 mg/dL (ref 0.6–1.1)
Glucose: 111 mg/dl — ABNORMAL HIGH (ref 70–99)
Total Bilirubin: 1.23 mg/dL — ABNORMAL HIGH (ref 0.20–1.20)
Total Protein: 6.2 g/dL — ABNORMAL LOW (ref 6.4–8.3)

## 2012-11-14 LAB — CBC WITH DIFFERENTIAL/PLATELET
BASO%: 0.2 % (ref 0.0–2.0)
Eosinophils Absolute: 0.1 10*3/uL (ref 0.0–0.5)
HCT: 37.4 % (ref 34.8–46.6)
HGB: 12.3 g/dL (ref 11.6–15.9)
LYMPH%: 16.8 % (ref 14.0–49.7)
MONO#: 0.7 10*3/uL (ref 0.1–0.9)
NEUT#: 6.5 10*3/uL (ref 1.5–6.5)
NEUT%: 73.4 % (ref 38.4–76.8)
Platelets: 129 10*3/uL — ABNORMAL LOW (ref 145–400)
WBC: 8.9 10*3/uL (ref 3.9–10.3)
lymph#: 1.5 10*3/uL (ref 0.9–3.3)

## 2012-11-17 ENCOUNTER — Telehealth: Payer: Self-pay | Admitting: *Deleted

## 2012-11-17 NOTE — Telephone Encounter (Signed)
Lab results done 11/14/12 showed to Dr. Arline Asp.  Called pt at home and instructed pt that ok to start Revlimid 15 mg daily x 14 days  on 11/18/12 as per Dr. Arline Asp.   Pt voiced understanding.

## 2012-12-03 ENCOUNTER — Other Ambulatory Visit: Payer: Self-pay

## 2012-12-03 MED ORDER — LORATADINE 10 MG PO TABS: 10.0000 mg | ORAL_TABLET | Freq: Every day | ORAL | Status: DC

## 2012-12-04 ENCOUNTER — Other Ambulatory Visit: Payer: Self-pay | Admitting: *Deleted

## 2012-12-04 DIAGNOSIS — C9000 Multiple myeloma not having achieved remission: Secondary | ICD-10-CM

## 2012-12-04 MED ORDER — LENALIDOMIDE 15 MG PO CAPS: ORAL_CAPSULE | ORAL | Status: DC

## 2012-12-05 ENCOUNTER — Other Ambulatory Visit: Payer: 59 | Admitting: Lab

## 2012-12-05 DIAGNOSIS — C189 Malignant neoplasm of colon, unspecified: Secondary | ICD-10-CM

## 2012-12-05 DIAGNOSIS — C9000 Multiple myeloma not having achieved remission: Secondary | ICD-10-CM

## 2012-12-05 LAB — CBC WITH DIFFERENTIAL/PLATELET
BASO%: 0.4 % (ref 0.0–2.0)
Eosinophils Absolute: 0.1 10*3/uL (ref 0.0–0.5)
MCHC: 33.3 g/dL (ref 31.5–36.0)
MONO#: 0.9 10*3/uL (ref 0.1–0.9)
NEUT#: 7.7 10*3/uL — ABNORMAL HIGH (ref 1.5–6.5)
Platelets: 134 10*3/uL — ABNORMAL LOW (ref 145–400)
RBC: 3.88 10*6/uL (ref 3.70–5.45)
WBC: 10.3 10*3/uL (ref 3.9–10.3)
lymph#: 1.5 10*3/uL (ref 0.9–3.3)

## 2012-12-05 LAB — COMPREHENSIVE METABOLIC PANEL (CC13)
ALT: 30 U/L (ref 0–55)
Albumin: 3.7 g/dL (ref 3.5–5.0)
CO2: 26 mEq/L (ref 22–29)
Calcium: 9.6 mg/dL (ref 8.4–10.4)
Chloride: 107 mEq/L (ref 98–107)
Glucose: 113 mg/dl — ABNORMAL HIGH (ref 70–99)
Potassium: 3.9 mEq/L (ref 3.5–5.1)
Sodium: 142 mEq/L (ref 136–145)
Total Protein: 6.6 g/dL (ref 6.4–8.3)

## 2012-12-09 ENCOUNTER — Telehealth: Payer: Self-pay | Admitting: *Deleted

## 2012-12-09 ENCOUNTER — Other Ambulatory Visit: Payer: Self-pay | Admitting: Family Medicine

## 2012-12-09 NOTE — Telephone Encounter (Signed)
Spoke with pt and informed pt re:  OK to start Revlimid 15 mg po daily x 14 days - starting today 12/09/12 as per md's instructions.  Pt voiced understanding.

## 2012-12-10 LAB — HEMOGLOBIN A1C: Hgb A1c MFr Bld: 6.2 % — ABNORMAL HIGH (ref <5.7)

## 2012-12-10 LAB — TSH: TSH: 0.61 u[IU]/mL (ref 0.350–4.500)

## 2012-12-11 ENCOUNTER — Encounter: Payer: Self-pay | Admitting: Family Medicine

## 2012-12-11 ENCOUNTER — Ambulatory Visit (INDEPENDENT_AMBULATORY_CARE_PROVIDER_SITE_OTHER): Payer: 59 | Admitting: Family Medicine

## 2012-12-11 VITALS — BP 140/76 | HR 82 | Resp 18 | Ht 66.0 in | Wt 218.0 lb

## 2012-12-11 DIAGNOSIS — E669 Obesity, unspecified: Secondary | ICD-10-CM

## 2012-12-11 DIAGNOSIS — I10 Essential (primary) hypertension: Secondary | ICD-10-CM

## 2012-12-11 DIAGNOSIS — E119 Type 2 diabetes mellitus without complications: Secondary | ICD-10-CM

## 2012-12-11 DIAGNOSIS — M25561 Pain in right knee: Secondary | ICD-10-CM | POA: Insufficient documentation

## 2012-12-11 DIAGNOSIS — M25569 Pain in unspecified knee: Secondary | ICD-10-CM

## 2012-12-11 LAB — HM DIABETES FOOT EXAM: HM Diabetic Foot Exam: NORMAL

## 2012-12-11 MED ORDER — VALSARTAN-HYDROCHLOROTHIAZIDE 320-25 MG PO TABS: ORAL_TABLET | ORAL | Status: DC

## 2012-12-11 MED ORDER — KETOROLAC TROMETHAMINE 60 MG/2ML IJ SOLN
60.0000 mg | Freq: Once | INTRAMUSCULAR | Status: AC
  Administered 2012-12-11: 60 mg via INTRAMUSCULAR

## 2012-12-11 MED ORDER — IBUPROFEN 800 MG PO TABS: 800.0000 mg | ORAL_TABLET | Freq: Three times a day (TID) | ORAL | Status: DC | PRN

## 2012-12-11 NOTE — Progress Notes (Signed)
  Subjective:    Patient ID: Darlene Howard, female    DOB: Sep 07, 1951, 61 y.o.   MRN: [id number]  HPI The PT is here for follow up and re-evaluation of chronic medical conditions, medication management and review of any available recent lab and radiology data.  Preventive health is updated, specifically  Cancer screening and Immunization.   Questions or concerns regarding consultations or procedures which the PT has had in the interim are  Addressed.Recently saw cancer Doc and is doing well. No consistency in diet and exercise, however blood sugars are well controlled, tests dAaily The PT denies any adverse reactions to current medications since the last visit.  Pt fell recently and now ha increased back and right knee and left hand pain, slipped on ice on her step    Review of Systems See HPI Denies recent fever or chills. Denies sinus pressure, nasal congestion, ear pain or sore throat. Denies chest congestion, productive cough or wheezing. Denies chest pains, palpitations and leg swelling Denies abdominal pain, nausea, vomiting,diarrhea or constipation.   Denies dysuria, frequency, hesitancy or incontinence.  Denies headaches, seizures, numbness, or tingling. Denies depression, anxiety or insomnia. Denies skin break down or rash.        Objective:   Physical Exam Patient alert and oriented and in no cardiopulmonary distress.  HEENT: No facial asymmetry, EOMI, no sinus tenderness,  oropharynx pink and moist.  Neck supple no adenopathy.  Chest: Clear to auscultation bilaterally.  CVS: S1, S2 no murmurs, no S3.  ABD: Soft non tender. Bowel sounds normal.  Ext: No edema  [id number] though adequate ROM spine,normal in  shoulders, hips and knees.  Skin: Intact, no ulcerations or rash noted.  Psych: Good eye contact, normal affect. Memory intact not anxious or depressed appearing.  CNS: CN 2-12 intact, power, tone and sensation normal throughout.        Assessment  & Plan:

## 2012-12-11 NOTE — Patient Instructions (Addendum)
F/U in 3.5 month, please call if you need me before  Dose increase in diovan/Hctz  To 320/25 one daily, continue amlodipine  Toradol 60mg  administered in office due to fall, also ibuprofen sent to the pharmacy    Fasting chrem 7 and HBa1c in 3.5 month, just before visit  It is important that you exercise regularly at least 30 minutes 5 times a week. If you develop chest pain, have severe difficulty breathing, or feel very tired, stop exercising immediately and seek medical attention  A healthy diet is rich in fruit, vegetables and whole grains. Poultry fish, nuts and beans are a healthy choice for protein rather then red meat. A low sodium diet and drinking 64 ounces of water daily is generally recommended. Oils and sweet should be limited. Carbohydrates especially for those who are diabetic or overweight, should be limited to 30-45 gram per meal. It is important to eat on a regular schedule, at least 3 times daily. Snacks should be primarily fruits, vegetables or nuts.

## 2012-12-12 MED ORDER — VALACYCLOVIR HCL 1 G PO TABS: 1000.0000 mg | ORAL_TABLET | Freq: Every day | ORAL | Status: DC | PRN

## 2012-12-13 NOTE — Assessment & Plan Note (Signed)
Deteriorated. Patient re-educated about  the importance of commitment to a  minimum of 150 minutes of exercise per week. The importance of healthy food choices with portion control discussed. Encouraged to start a food diary, count calories and to consider  joining a support group. Sample diet sheets offered. Goals set by the patient for the next several months.    

## 2012-12-13 NOTE — Assessment & Plan Note (Signed)
increased knee pain s/p fall toradol in office and ibuprofen prescribed

## 2012-12-13 NOTE — Assessment & Plan Note (Signed)
Uncontrolled has been non compliant with med, importance of same stressed DASH diet and commitment to daily physical activity for a minimum of 30 minutes discussed and encouraged, as a part of hypertension management. The importance of attaining a healthy weight is also discussed.

## 2012-12-13 NOTE — Assessment & Plan Note (Signed)
Controlled, no change in medication, advised pt she may stop metformin but she prefers to continue Patient advised to reduce carb and sweets, commit to regular physical activity, take meds as prescribed, test blood as directed, and attempt to lose weight, to improve blood sugar control.

## 2012-12-14 NOTE — Progress Notes (Signed)
  Subjective:    Patient ID: Darlene Howard, female    DOB: 08-03-1951, 61 y.o.   MRN: [id number]  HPI    Review of Systems     Objective:   Physical Exam        Assessment & Plan:

## 2012-12-15 ENCOUNTER — Other Ambulatory Visit: Payer: Self-pay | Admitting: Nurse Practitioner

## 2012-12-15 DIAGNOSIS — C9000 Multiple myeloma not having achieved remission: Secondary | ICD-10-CM

## 2012-12-15 MED ORDER — DEXAMETHASONE 4 MG PO TABS: 4.0000 mg | ORAL_TABLET | ORAL | Status: DC

## 2012-12-15 MED ORDER — DEXAMETHASONE 4 MG PO TABS: 12.0000 mg | ORAL_TABLET | ORAL | Status: DC

## 2012-12-20 ENCOUNTER — Telehealth: Payer: Self-pay | Admitting: Internal Medicine

## 2012-12-20 NOTE — Telephone Encounter (Signed)
s.w. pt and advised on physican change and time change...pt ok....former physician Dr. Marton Redwood reassigned to Dr. Arbutus Ped

## 2012-12-23 ENCOUNTER — Telehealth: Payer: Self-pay | Admitting: Medical Oncology

## 2012-12-23 DIAGNOSIS — C9000 Multiple myeloma not having achieved remission: Secondary | ICD-10-CM

## 2012-12-23 MED ORDER — LENALIDOMIDE 15 MG PO CAPS: ORAL_CAPSULE | ORAL | Status: DC

## 2012-12-23 NOTE — Telephone Encounter (Signed)
Faxed signed rx to [facility]

## 2012-12-23 NOTE — Telephone Encounter (Signed)
revlimid refill sent to Dr Arbutus Ped for authorization

## 2012-12-26 ENCOUNTER — Other Ambulatory Visit (HOSPITAL_BASED_OUTPATIENT_CLINIC_OR_DEPARTMENT_OTHER): Payer: 59 | Admitting: Lab

## 2012-12-26 DIAGNOSIS — C9000 Multiple myeloma not having achieved remission: Secondary | ICD-10-CM

## 2012-12-26 LAB — COMPREHENSIVE METABOLIC PANEL (CC13)
ALT: 27 U/L (ref 0–55)
AST: 19 U/L (ref 5–34)
Albumin: 3.3 g/dL — ABNORMAL LOW (ref 3.5–5.0)
Alkaline Phosphatase: 80 U/L (ref 40–150)
BUN: 13 mg/dL (ref 7.0–26.0)
Chloride: 104 mEq/L (ref 98–107)
Creatinine: 0.9 mg/dL (ref 0.6–1.1)
Potassium: 3.6 mEq/L (ref 3.5–5.1)

## 2012-12-26 LAB — CBC WITH DIFFERENTIAL/PLATELET
BASO%: 0.5 % (ref 0.0–2.0)
Basophils Absolute: 0 10*3/uL (ref 0.0–0.1)
EOS%: 2.2 % (ref 0.0–7.0)
HGB: 11.3 g/dL — ABNORMAL LOW (ref 11.6–15.9)
MCH: 33 pg (ref 25.1–34.0)
MCHC: 33.1 g/dL (ref 31.5–36.0)
MCV: 99.6 fL (ref 79.5–101.0)
MONO%: 9.8 % (ref 0.0–14.0)
RDW: 15 % — ABNORMAL HIGH (ref 11.2–14.5)
lymph#: 1.6 10*3/uL (ref 0.9–3.3)

## 2012-12-29 ENCOUNTER — Ambulatory Visit (INDEPENDENT_AMBULATORY_CARE_PROVIDER_SITE_OTHER): Payer: 59 | Admitting: Family Medicine

## 2012-12-29 ENCOUNTER — Encounter: Payer: Self-pay | Admitting: Family Medicine

## 2012-12-29 VITALS — BP 150/90 | HR 85 | Temp 98.9°F | Resp 16 | Ht 66.0 in | Wt 215.0 lb

## 2012-12-29 DIAGNOSIS — J209 Acute bronchitis, unspecified: Secondary | ICD-10-CM

## 2012-12-29 DIAGNOSIS — J0100 Acute maxillary sinusitis, unspecified: Secondary | ICD-10-CM | POA: Insufficient documentation

## 2012-12-29 DIAGNOSIS — I10 Essential (primary) hypertension: Secondary | ICD-10-CM

## 2012-12-29 DIAGNOSIS — J019 Acute sinusitis, unspecified: Secondary | ICD-10-CM

## 2012-12-29 MED ORDER — SULFAMETHOXAZOLE-TRIMETHOPRIM 800-160 MG PO TABS: 1.0000 | ORAL_TABLET | Freq: Two times a day (BID) | ORAL | Status: DC

## 2012-12-29 MED ORDER — BENZONATATE 100 MG PO CAPS: 100.0000 mg | ORAL_CAPSULE | Freq: Four times a day (QID) | ORAL | Status: DC | PRN

## 2012-12-29 NOTE — Patient Instructions (Addendum)
F/u as before  You are being treated for acute sinusitis  Medication is sent in to the pharmacy septra and decongestant  Blood pressure is slightly elevated, please take meds as prescribed

## 2012-12-31 NOTE — Assessment & Plan Note (Signed)
Uncontrolled, currently has not taken amlodipine for 2 days, will collect at the pharmacy. Importance of med compliance stressed. DASH diet and commitment to daily physical activity for a minimum of 30 minutes discussed and encouraged, as a part of hypertension management. The importance of attaining a healthy weight is also discussed.

## 2012-12-31 NOTE — Progress Notes (Signed)
  Subjective:    Patient ID: Darlene Howard, female    DOB: [birth year], 62 y.o.   MRN: [id number]  HPI 3 day h/o increased sinus pressure with yellow nasal drainage  and chills, no documented fever or body aches, no sore throat , ear pain or productive cough. Feels weak   Review of Systems See HPI Denies chest pains, palpitations and leg swelling Denies abdominal pain, nausea, vomiting,diarrhea or constipation.   Denies dysuria, frequency, hesitancy or incontinence. Denies joint pain, swelling and limitation in mobility. Denies headaches, seizures, numbness, or tingling. Denies depression, anxiety or insomnia. Denies skin break down or rash.        Objective:   Physical Exam Patient alert and oriented and in no cardiopulmonary distress.Ill appearing  HEENT: No facial asymmetry, EOMI,frontal and maxillary o sinus tenderness,  oropharynx pink and moist.  Neck supple anterior cervical adenopathy.TM clear  Chest: Clear to auscultation bilaterally.  CVS: S1, S2 no murmurs, no S3.  ABD: Soft non tender. Bowel sounds normal.  Ext: No edema  MS: Adequate ROM spine, shoulders, hips and knees.  Skin: Intact, no ulcerations or rash noted.  Psych: Good eye contact, normal affect. Memory intact not anxious or depressed appearing.  CNS: CN 2-12 intact, power, normal throughout.        Assessment & Plan:

## 2012-12-31 NOTE — Assessment & Plan Note (Signed)
Acute infection, antibiotic and decongestant sent to pharmacy

## 2013-01-03 ENCOUNTER — Encounter: Payer: Self-pay | Admitting: *Deleted

## 2013-01-08 ENCOUNTER — Other Ambulatory Visit: Payer: Self-pay | Admitting: Medical Oncology

## 2013-01-08 ENCOUNTER — Other Ambulatory Visit: Payer: Self-pay

## 2013-01-08 DIAGNOSIS — C9000 Multiple myeloma not having achieved remission: Secondary | ICD-10-CM

## 2013-01-08 MED ORDER — ALENDRONATE SODIUM 70 MG PO TABS: ORAL_TABLET | ORAL | Status: DC

## 2013-01-09 MED ORDER — LENALIDOMIDE 15 MG PO CAPS: ORAL_CAPSULE | ORAL | Status: DC

## 2013-01-09 NOTE — Telephone Encounter (Signed)
Faxed revlimid refill to [facility]

## 2013-01-19 ENCOUNTER — Other Ambulatory Visit: Payer: Self-pay | Admitting: *Deleted

## 2013-01-19 DIAGNOSIS — C9000 Multiple myeloma not having achieved remission: Secondary | ICD-10-CM

## 2013-01-19 NOTE — Telephone Encounter (Signed)
RECEIVED A FAX FROM [facility] CONCERNING A CONFIRMATION OF PRESCRIPTION SHIPMENT FOR REVLIMID ON 01/15/13.

## 2013-01-20 ENCOUNTER — Telehealth: Payer: Self-pay | Admitting: Internal Medicine

## 2013-01-20 ENCOUNTER — Ambulatory Visit: Payer: 59 | Admitting: Hematology and Oncology

## 2013-01-20 ENCOUNTER — Encounter: Payer: Self-pay | Admitting: Internal Medicine

## 2013-01-20 ENCOUNTER — Ambulatory Visit (HOSPITAL_BASED_OUTPATIENT_CLINIC_OR_DEPARTMENT_OTHER): Payer: 59 | Admitting: Internal Medicine

## 2013-01-20 ENCOUNTER — Other Ambulatory Visit: Payer: 59 | Admitting: Lab

## 2013-01-20 ENCOUNTER — Other Ambulatory Visit (HOSPITAL_BASED_OUTPATIENT_CLINIC_OR_DEPARTMENT_OTHER): Payer: 59 | Admitting: Lab

## 2013-01-20 VITALS — BP 154/67 | HR 75 | Temp 97.3°F | Resp 18 | Ht 66.0 in | Wt 215.9 lb

## 2013-01-20 DIAGNOSIS — R209 Unspecified disturbances of skin sensation: Secondary | ICD-10-CM

## 2013-01-20 DIAGNOSIS — C9000 Multiple myeloma not having achieved remission: Secondary | ICD-10-CM

## 2013-01-20 LAB — CBC WITH DIFFERENTIAL/PLATELET
BASO%: 0.5 % (ref 0.0–2.0)
Basophils Absolute: 0 10*3/uL (ref 0.0–0.1)
EOS%: 0.3 % (ref 0.0–7.0)
HCT: 35.7 % (ref 34.8–46.6)
HGB: 11.7 g/dL (ref 11.6–15.9)
LYMPH%: 23.6 % (ref 14.0–49.7)
MCH: 32.1 pg (ref 25.1–34.0)
MCHC: 32.7 g/dL (ref 31.5–36.0)
MCV: 98.3 fL (ref 79.5–101.0)
MONO%: 16.9 % — ABNORMAL HIGH (ref 0.0–14.0)
NEUT%: 58.7 % (ref 38.4–76.8)

## 2013-01-20 LAB — COMPREHENSIVE METABOLIC PANEL (CC13)
AST: 24 U/L (ref 5–34)
Alkaline Phosphatase: 62 U/L (ref 40–150)
BUN: 16 mg/dL (ref 7.0–26.0)
Calcium: 9.5 mg/dL (ref 8.4–10.4)
Creatinine: 0.8 mg/dL (ref 0.6–1.1)
Total Bilirubin: 1.02 mg/dL (ref 0.20–1.20)

## 2013-01-20 NOTE — Telephone Encounter (Signed)
appts made and printed for pt aom °

## 2013-01-20 NOTE — Progress Notes (Signed)
[facility] Telephone:(336) [phone]   Fax:(336) [phone]  OFFICE PROGRESS NOTE  Darlene Overman, MD [address], [address] [place] [address]  DIAGNOSIS: Stage IIA IgA kappa multiple myeloma diagnosed in 2006  PRIOR THERAPY: 1) status post treatment with thalidomide and Decadron followed by autologous peripheral blood stem cell transplant with high-dose melphalan on 04/26/2006 at [facility]. 2) the patient had evidence for disease recurrence in August of 2008 and she was treated with 6 cycles of Velcade completed on 03/30/2008 with response to the treatment but was discontinued secondary to neuropathy. 3) maintenance treatment with Revlimid 10 mg by mouth daily by the does was later increase to 25 mg by mouth daily secondary to biochemical recurrence with stabilization of her disease.  CURRENT THERAPY: Revlimid 15 mg by mouth daily for 2 weeks every 3 weeks in addition to Decadron currently at 4 mg by mouth on a weekly basis. The patient will start the next cycle of her chemotherapy with Revlimid today.  INTERVAL HISTORY: Darlene Howard 62 y.o. female returns to the clinic today for followup visit. She is a former patient of Dr. Dalene Carrow who is here today to establish care with me after Dr. Joette Catching left the practice. The patient is feeling fine today with no specific complaints. She is tolerating her treatment with Revlimid and Decadron fairly well with no significant adverse effects. She continues to have mild numbness and tingling is in the lower extremities but this has been an ongoing issue since her treatment with Velcade. She denied having any significant fatigue or weakness. She denied having any nausea or vomiting. The patient denied having any significant weight loss or night sweats. She denied having any chest pain, shortness breath, cough or hemoptysis. She had no bleeding issues.  MEDICAL HISTORY: Past Medical History  Diagnosis Date  . Glaucoma(365)     . Obesity   . Hypertension   . Personal history of colon cancer     next   . Diabetes mellitus   . Hyperlipidemia   . Multiple myeloma(203.0)   . Allergy   . GERD (gastroesophageal reflux disease)   . Herpes     ALLERGIES:  is allergic to codeine.  MEDICATIONS:  Current Outpatient Prescriptions  Medication Sig Dispense Refill  . alendronate (FOSAMAX) 70 MG tablet Take with a full glass of water on an empty stomach.TAKE 1 TAB EACH WEEK 30 MIN PRIOR TO BREAKFAST WITH LARGE GLASS OF WATER. REMAIN UPRIGHT.  4 tablet  3  . aspirin (ASPIRIN LOW DOSE) 81 MG EC tablet Take 81 mg by mouth daily.        . bimatoprost (LUMIGAN) 0.03 % ophthalmic drops Place 1 drop into both eyes at bedtime.        . calcium-vitamin D (OSCAL 500/200 D-3) 500-200 MG-UNIT per tablet Take 1 tablet by mouth 3 (three) times daily.        Marland Kitchen conjugated estrogens (PREMARIN) vaginal cream Place 1 g vaginally. Insert into vaginal 3 times a week        . dexamethasone (DECADRON) 4 MG tablet Take 1 tablet (4 mg total) by mouth once a week. Take five tablets by mouth once weekly as directed.  20 tablet  1  . dorzolamide-timolol (COSOPT) 22.3-6.8 MG/ML ophthalmic solution Place 1 drop into both eyes 2 (two) times daily.      Marland Kitchen GLUCOPHAGE 1000 MG tablet TAKE 1 TABLET BY MOUTH TWICE DAILY.  60 tablet  4  .  ibuprofen (ADVIL,MOTRIN) 800 MG tablet Take 1 tablet (800 mg total) by mouth every 8 (eight) hours as needed for pain.  30 tablet  0  . K-DUR 20 MEQ tablet TAKE 1 TABLET BY MOUTH TWICE DAILY.  60 tablet  4  . Lancets (ONETOUCH ULTRASOFT) lancets TEST ONCE DAILY OR AS DIRECTED.  100 each  6  . lenalidomide (REVLIMID) 15 MG capsule Take one capsule daily for 14 days.  Then 7 days off.  14 capsule  0  . loratadine (QC LORATADINE ALLERGY RELIEF) 10 MG tablet Take 1 tablet (10 mg total) by mouth daily.  30 tablet  3  . Multiple Vitamins-Minerals (CENTRUM SILVER PO) Take by mouth daily.        . NORVASC 2.5 MG tablet TAKE 1 TABLET  BY MOUTH ONCE A DAY.  30 tablet  4  . ONE TOUCH ULTRA TEST test strip USE AS DIRECTED TO CHECK DAILY.  50 each  5  . pantoprazole (PROTONIX) 40 MG tablet TAKE ONE TABLET BY MOUTH EVERY DAY FOR ACID REFLUX.  30 tablet  5  . valACYclovir (VALTREX) 1000 MG tablet Take 1 tablet (1,000 mg total) by mouth daily as needed.  30 tablet  prn  . valsartan-hydrochlorothiazide (DIOVAN HCT) 320-25 MG per tablet Dose increase effective 12/11/2012  Discontinue diovan/HCTZ 320/12.5 effective 12/11/2012  30 tablet  5    SURGICAL HISTORY:  Past Surgical History  Procedure Date  . Partial hysterectomy fibroids 1980  . Bone marrow transplant     REVIEW OF SYSTEMS:  A comprehensive review of systems was negative except for: Neurological: positive for paresthesia   PHYSICAL EXAMINATION: General appearance: alert, cooperative and no distress Head: Normocephalic, without obvious abnormality, atraumatic Neck: no adenopathy Lymph nodes: Cervical, supraclavicular, and axillary nodes normal. Resp: clear to auscultation bilaterally Cardio: regular rate and rhythm, S1, S2 normal, no murmur, click, rub or gallop GI: soft, non-tender; bowel sounds normal; no masses,  no organomegaly Extremities: extremities normal, atraumatic, no cyanosis or edema Neurologic: Alert and oriented X 3, normal strength and tone. Normal symmetric reflexes. Normal coordination and gait  ECOG PERFORMANCE STATUS: 1 - Symptomatic but completely ambulatory  Blood pressure 154/67, pulse 75, temperature 97.3 F (36.3 C), temperature source Oral, resp. rate 18, height 5\' 6"  (1.676 m), weight 215 lb 14.4 oz (97.932 kg).  LABORATORY DATA: Lab Results  Component Value Date   WBC 8.0 01/20/2013   HGB 11.7 01/20/2013   HCT 35.7 01/20/2013   MCV 98.3 01/20/2013   PLT 126* 01/20/2013      Chemistry      Component Value Date/Time   NA 144 12/26/2012 1426   NA 139 08/22/2012 1445   K 3.6 12/26/2012 1426   K 4.0 08/22/2012 1445   CL 104  12/26/2012 1426   CL 104 08/22/2012 1445   CO2 29 12/26/2012 1426   CO2 28 08/22/2012 1445   BUN 13.0 12/26/2012 1426   BUN 8 08/22/2012 1445   CREATININE 0.9 12/26/2012 1426   CREATININE 0.78 08/22/2012 1445   CREATININE 0.89 07/17/2012 1200      Component Value Date/Time   CALCIUM 9.2 12/26/2012 1426   CALCIUM 9.7 08/22/2012 1445   ALKPHOS 80 12/26/2012 1426   ALKPHOS 52 08/22/2012 1445   AST 19 12/26/2012 1426   AST 16 08/22/2012 1445   ALT 27 12/26/2012 1426   ALT 30 08/22/2012 1445   BILITOT 0.94 12/26/2012 1426   BILITOT 0.8 08/22/2012 1445  RADIOGRAPHIC STUDIES: No results found.  ASSESSMENT: This is a very pleasant 62 years old [place] American female with history of multiple myeloma currently on Revlimid 15 mg by mouth daily for 2 weeks every 3 weeks in addition to Decadron 4 mg on a weekly basis. The patient is tolerating her treatment fairly well. Her last myeloma panel was performed more than year ago.  PLAN: I have a lengthy discussion with the patient today about her condition. I will repeat her myeloma panel today for evaluation of her disease and response to therapy. The patient will continue on her current treatment for now unless there is any evidence for significant disease progression She would come back for followup visit in 3 weeks with the start of the next cycle of her treatment. She was advised to call me immediately if she has any concerning symptoms in the interval. All questions were answered. The patient knows to call the clinic with any problems, questions or concerns. We can certainly see the patient much sooner if necessary.  I spent 20 minutes counseling the patient face to face. The total time spent in the appointment was 30 minutes.

## 2013-01-20 NOTE — Patient Instructions (Signed)
Continue treatment with Revlimid and Decadron, Followup in 3 weeks

## 2013-01-22 LAB — BETA 2 MICROGLOBULIN, SERUM: Beta-2 Microglobulin: 1.73 mg/L (ref 1.01–1.73)

## 2013-01-22 LAB — SPEP & IFE WITH QIG
Alpha-1-Globulin: 4.3 % (ref 2.9–4.9)
Alpha-2-Globulin: 11.6 % (ref 7.1–11.8)
Beta Globulin: 6.7 % (ref 4.7–7.2)
Gamma Globulin: 6 % — ABNORMAL LOW (ref 11.1–18.8)

## 2013-01-22 LAB — KAPPA/LAMBDA LIGHT CHAINS
Kappa free light chain: 1.02 mg/dL (ref 0.33–1.94)
Kappa:Lambda Ratio: 1.26 (ref 0.26–1.65)

## 2013-01-26 ENCOUNTER — Other Ambulatory Visit: Payer: Self-pay | Admitting: Family Medicine

## 2013-02-03 ENCOUNTER — Other Ambulatory Visit: Payer: Self-pay | Admitting: *Deleted

## 2013-02-03 DIAGNOSIS — C9000 Multiple myeloma not having achieved remission: Secondary | ICD-10-CM

## 2013-02-03 MED ORDER — LENALIDOMIDE 15 MG PO CAPS: ORAL_CAPSULE | ORAL | Status: DC

## 2013-02-03 NOTE — Telephone Encounter (Signed)
THIS REFILL REQUEST FOR REVLIMID WAS GIVEN TO DR.MOHAMED'S NURSE, STEPHANIE JOHNSON,RN. 

## 2013-02-03 NOTE — Addendum Note (Signed)
Addended by: Arvilla Meres on: 02/03/2013 04:19 PM   Modules accepted: Orders

## 2013-02-05 ENCOUNTER — Telehealth: Payer: Self-pay | Admitting: Family Medicine

## 2013-02-06 NOTE — Telephone Encounter (Signed)
Patient will address concern with oncologist on Tuesday.

## 2013-02-10 ENCOUNTER — Telehealth: Payer: Self-pay | Admitting: Internal Medicine

## 2013-02-10 ENCOUNTER — Ambulatory Visit (HOSPITAL_BASED_OUTPATIENT_CLINIC_OR_DEPARTMENT_OTHER): Payer: 59 | Admitting: Physician Assistant

## 2013-02-10 ENCOUNTER — Other Ambulatory Visit (HOSPITAL_BASED_OUTPATIENT_CLINIC_OR_DEPARTMENT_OTHER): Payer: 59 | Admitting: Lab

## 2013-02-10 VITALS — BP 138/76 | HR 76 | Temp 98.3°F | Resp 18 | Ht 66.0 in | Wt 211.1 lb

## 2013-02-10 DIAGNOSIS — C9000 Multiple myeloma not having achieved remission: Secondary | ICD-10-CM

## 2013-02-10 LAB — COMPREHENSIVE METABOLIC PANEL (CC13)
Albumin: 3.6 g/dL (ref 3.5–5.0)
BUN: 16.6 mg/dL (ref 7.0–26.0)
CO2: 28 mEq/L (ref 22–29)
Calcium: 9.8 mg/dL (ref 8.4–10.4)
Chloride: 105 mEq/L (ref 98–107)
Glucose: 95 mg/dl (ref 70–99)
Potassium: 3.6 mEq/L (ref 3.5–5.1)

## 2013-02-10 LAB — CBC WITH DIFFERENTIAL/PLATELET
Basophils Absolute: 0.1 10*3/uL (ref 0.0–0.1)
HCT: 37.1 % (ref 34.8–46.6)
HGB: 12.3 g/dL (ref 11.6–15.9)
LYMPH%: 23.6 % (ref 14.0–49.7)
MONO#: 1 10*3/uL — ABNORMAL HIGH (ref 0.1–0.9)
NEUT%: 62.2 % (ref 38.4–76.8)
Platelets: 140 10*3/uL — ABNORMAL LOW (ref 145–400)
WBC: 8 10*3/uL (ref 3.9–10.3)
lymph#: 1.9 10*3/uL (ref 0.9–3.3)

## 2013-02-10 NOTE — Telephone Encounter (Signed)
Gave pt appt for labs and MD on March 2014

## 2013-02-10 NOTE — Patient Instructions (Addendum)
Continue your Revlimid and Dexamethasone as prescribed Follow up in 3 weeks

## 2013-02-12 NOTE — Progress Notes (Signed)
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 2) the patient had evidence for disease recurrence in August of 2008 and she was treated with 6 cycles of Velcade completed on 03/30/2008 with response to the treatment but was discontinued secondary to neuropathy. 3) maintenance treatment with Revlimid 10 mg by mouth daily by the does was later increase to 25 mg by mouth daily secondary to biochemical recurrence with stabilization of her disease.  CURRENT THERAPY: Revlimid 15 mg by mouth daily for 2 weeks every 3 weeks in addition to Decadron currently at 4 mg by mouth on a weekly basis. The patient will start the next cycle of her chemotherapy with Revlimid today.  INTERVAL HISTORY: Darlene Howard 62 y.o. female returns to the clinic today for followup visit.The patient is feeling fine today with no specific complaints. She does or port a cough with some hoarseness but states that the symptoms have resolved and were not associated with any fever or chills She is tolerating her treatment with Revlimid and Decadron fairly well with no significant adverse effects. She continues to have mild numbness and tingling is in the lower extremities but this has been an ongoing issue since her treatment with Velcade. She denied having any significant fatigue or weakness. She denied having any nausea or vomiting. The patient denied having any significant weight loss or night sweats. She denied having any chest pain, shortness breath, cough or hemoptysis. She had no bleeding issues.  MEDICAL HISTORY: Past Medical History  Diagnosis Date   . Glaucoma(365)   . Obesity   . Hypertension   . Personal history of colon cancer     next   . Diabetes mellitus   . Hyperlipidemia   . Multiple myeloma(203.0)   . Allergy   . GERD (gastroesophageal reflux disease)   . Herpes     ALLERGIES:  is allergic to codeine.  MEDICATIONS:  Current Outpatient Prescriptions  Medication Sig Dispense Refill  . alendronate (FOSAMAX) 70 MG tablet Take with a full glass of water on an empty stomach.TAKE 1 TAB EACH WEEK 30 MIN PRIOR TO BREAKFAST WITH LARGE GLASS OF WATER. REMAIN UPRIGHT.  4 tablet  3  . aspirin (ASPIRIN LOW DOSE) 81 MG EC tablet Take 81 mg by mouth daily.        . bimatoprost (LUMIGAN) 0.03 % ophthalmic drops Place 1 drop into both eyes at bedtime.        . calcium-vitamin D (OSCAL 500/200 D-3) 500-200 MG-UNIT per tablet Take 1 tablet by mouth 3 (three) times daily.        Marland Kitchen conjugated estrogens (PREMARIN) vaginal cream Place 1 g vaginally. Insert into vaginal 3 times a week        . dexamethasone (DECADRON) 4 MG tablet Take 1 tablet (4 mg total) by mouth once a week. Take five tablets by mouth once weekly as directed.  20 tablet  1  . dorzolamide-timolol (COSOPT) 22.3-6.8 MG/ML ophthalmic solution Place 1 drop into both eyes 2 (two) times daily.      Marland Kitchen GLUCOPHAGE 1000 MG tablet TAKE 1 TABLET BY MOUTH TWICE DAILY.  60 tablet  4  .  K-DUR 20 MEQ tablet TAKE 1 TABLET BY MOUTH TWICE DAILY.  60 tablet  4  . Lancets (ONETOUCH ULTRASOFT) lancets TEST ONCE DAILY OR AS DIRECTED.  100 each  6  . lenalidomide (REVLIMID) 15 MG capsule Take one capsule daily for 14 days.  Then 7 days off.  14 capsule  0  . loratadine (QC LORATADINE ALLERGY RELIEF) 10 MG tablet Take 1 tablet (10 mg total) by mouth daily.  30 tablet  3  . Multiple Vitamins-Minerals (CENTRUM SILVER PO) Take by mouth daily.        . NORVASC 2.5 MG tablet TAKE 1 TABLET BY MOUTH ONCE A DAY.  30 tablet  4  . ONE TOUCH ULTRA TEST test strip USE AS DIRECTED TO CHECK DAILY.  50 each  5   . pantoprazole (PROTONIX) 40 MG tablet TAKE ONE TABLET BY MOUTH EVERY DAY FOR ACID REFLUX.  30 tablet  2  . valACYclovir (VALTREX) 1000 MG tablet Take 1 tablet (1,000 mg total) by mouth daily as needed.  30 tablet  prn  . valsartan-hydrochlorothiazide (DIOVAN HCT) 320-25 MG per tablet Dose increase effective 12/11/2012  Discontinue diovan/HCTZ 320/12.5 effective 12/11/2012  30 tablet  5   No current facility-administered medications for this visit.    SURGICAL HISTORY:  Past Surgical History  Procedure Laterality Date  . Partial hysterectomy fibroids  1980  . Bone marrow transplant      REVIEW OF SYSTEMS:  A comprehensive review of systems was negative except for: Neurological: positive for paresthesia   PHYSICAL EXAMINATION: General appearance: alert, cooperative and no distress Head: Normocephalic, without obvious abnormality, atraumatic Neck: no adenopathy Lymph nodes: Cervical, supraclavicular, and axillary nodes normal. Resp: clear to auscultation bilaterally Cardio: regular rate and rhythm, S1, S2 normal, no murmur, click, rub or gallop GI: soft, non-tender; bowel sounds normal; no masses,  no organomegaly Extremities: extremities normal, atraumatic, no cyanosis or edema Neurologic: Alert and oriented X 3, normal strength and tone. Normal symmetric reflexes. Normal coordination and gait  ECOG PERFORMANCE STATUS: 1 - Symptomatic but completely ambulatory  Blood pressure 138/76, pulse 76, temperature 98.3 F (36.8 C), temperature source Oral, resp. rate 18, height 5\' 6"  (1.676 m), weight 211 lb 1.6 oz (95.754 kg).  LABORATORY DATA: Lab Results  Component Value Date   WBC 8.0 02/10/2013   HGB 12.3 02/10/2013   HCT 37.1 02/10/2013   MCV 97.3 02/10/2013   PLT 140* 02/10/2013      Chemistry      Component Value Date/Time   NA 141 02/10/2013 1457   NA 139 08/22/2012 1445   K 3.6 02/10/2013 1457   K 4.0 08/22/2012 1445   CL 105 02/10/2013 1457   CL 104 08/22/2012 1445   CO2  28 02/10/2013 1457   CO2 28 08/22/2012 1445   BUN 16.6 02/10/2013 1457   BUN 8 08/22/2012 1445   CREATININE 0.8 02/10/2013 1457   CREATININE 0.78 08/22/2012 1445   CREATININE 0.89 07/17/2012 1200      Component Value Date/Time   CALCIUM 9.8 02/10/2013 1457   CALCIUM 9.7 08/22/2012 1445   ALKPHOS 62 02/10/2013 1457   ALKPHOS 52 08/22/2012 1445   AST 16 02/10/2013 1457   AST 16 08/22/2012 1445   ALT 24 02/10/2013 1457   ALT 30 08/22/2012 1445   BILITOT 1.02 02/10/2013 1457   BILITOT 0.8 08/22/2012 1445       RADIOGRAPHIC STUDIES: No results found.  ASSESSMENT/PLAN: This is a very pleasant 62 years old  African American female with history of multiple myeloma currently on Revlimid 15 mg by mouth daily for 2 weeks every 3 weeks in addition to Decadron 4 mg on a weekly basis. The patient is tolerating her treatment fairly well. Her recent myeloma panel revealed relatively stable disease. Patient was discussed with Dr. Arbutus Ped. She will continue on her Revlimid at 15 mg by mouth daily for 2 weeks every 3 weeks in addition to Decadron 4 mg by mouth once weekly. She'll return in 3 weeks prior to her next scheduled cycle of Revlimid and Decadron.   Darlene Marulanda E, PA-C   She was advised to call immediately if she has any concerning symptoms in the interval. All questions were answered. The patient knows to call the clinic with any problems, questions or concerns. We can certainly see the patient much sooner if necessary.  I spent 20 minutes counseling the patient face to face. The total time spent in the appointment was 30 minutes.

## 2013-02-23 ENCOUNTER — Other Ambulatory Visit: Payer: Self-pay | Admitting: *Deleted

## 2013-02-23 MED ORDER — LENALIDOMIDE 15 MG PO CAPS: ORAL_CAPSULE | ORAL | Status: DC

## 2013-02-23 NOTE — Addendum Note (Signed)
Addended by: Arvilla Meres on: 02/23/2013 12:27 PM   Modules accepted: Orders

## 2013-02-23 NOTE — Telephone Encounter (Signed)
THIS REFILL REQUEST FOR REVLIMID WAS GIVEN TO DR.MOHAMED'S NURSE, STEPHANIE JOHNSON,RN. 

## 2013-03-02 ENCOUNTER — Telehealth: Payer: Self-pay | Admitting: Family Medicine

## 2013-03-03 ENCOUNTER — Ambulatory Visit (HOSPITAL_BASED_OUTPATIENT_CLINIC_OR_DEPARTMENT_OTHER): Payer: 59 | Admitting: Physician Assistant

## 2013-03-03 ENCOUNTER — Other Ambulatory Visit (HOSPITAL_BASED_OUTPATIENT_CLINIC_OR_DEPARTMENT_OTHER): Payer: 59 | Admitting: Lab

## 2013-03-03 ENCOUNTER — Telehealth: Payer: Self-pay | Admitting: Physician Assistant

## 2013-03-03 ENCOUNTER — Encounter: Payer: Self-pay | Admitting: Physician Assistant

## 2013-03-03 VITALS — BP 138/75 | HR 68 | Temp 96.8°F | Resp 18 | Ht 66.0 in | Wt 210.2 lb

## 2013-03-03 DIAGNOSIS — C9000 Multiple myeloma not having achieved remission: Secondary | ICD-10-CM

## 2013-03-03 LAB — COMPREHENSIVE METABOLIC PANEL (CC13)
ALT: 37 U/L (ref 0–55)
BUN: 19.7 mg/dL (ref 7.0–26.0)
CO2: 26 mEq/L (ref 22–29)
Calcium: 9.6 mg/dL (ref 8.4–10.4)
Chloride: 106 mEq/L (ref 98–107)
Creatinine: 0.9 mg/dL (ref 0.6–1.1)
Glucose: 106 mg/dl — ABNORMAL HIGH (ref 70–99)

## 2013-03-03 LAB — CBC WITH DIFFERENTIAL/PLATELET
Basophils Absolute: 0 10*3/uL (ref 0.0–0.1)
EOS%: 0.3 % (ref 0.0–7.0)
Eosinophils Absolute: 0 10*3/uL (ref 0.0–0.5)
HGB: 12.3 g/dL (ref 11.6–15.9)
MCH: 32.5 pg (ref 25.1–34.0)
NEUT#: 5.1 10*3/uL (ref 1.5–6.5)
RBC: 3.78 10*6/uL (ref 3.70–5.45)
RDW: 14.5 % (ref 11.2–14.5)
lymph#: 2.2 10*3/uL (ref 0.9–3.3)

## 2013-03-03 LAB — LACTATE DEHYDROGENASE (CC13): LDH: 159 U/L (ref 125–245)

## 2013-03-03 NOTE — Patient Instructions (Addendum)
Continue taking your Revlimid and Decadron as prescribed Followup in 3 weeks for another symptom management visit

## 2013-03-06 NOTE — Progress Notes (Signed)
[facility] Telephone:(336) [phone]   Fax:(336) [phone]  OFFICE PROGRESS NOTE  [name], MD 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 2) the patient had evidence for disease recurrence in August of 2008 and she was treated with 6 cycles of Velcade completed on 03/30/2008 with response to the treatment but was discontinued secondary to neuropathy. 3) maintenance treatment with Revlimid 10 mg by mouth daily by the does was later increase to 25 mg by mouth daily secondary to biochemical recurrence with stabilization of her disease.  CURRENT THERAPY: Revlimid 15 mg by mouth daily for 2 weeks every 3 weeks in addition to Decadron currently at 4 mg by mouth on a weekly basis. The patient will start the next cycle of her chemotherapy with Revlimid today.  INTERVAL HISTORY: Darlene Howard 62 y.o. female returns to the clinic today for followup visit.The patient is feeling fine today with no new specific complaints. She does or port a cough with some hoarseness but states that the symptoms have resolved and were not associated with any fever or chills She is tolerating her treatment with Revlimid and Decadron fairly well with no significant adverse effects. She continues to have mild numbness and tingling is in the lower extremities but this has been an ongoing issue since her treatment with Velcade. She denied having any significant fatigue or weakness. She denied having any nausea or vomiting. The patient denied having any significant weight loss or night sweats. She denied having any chest pain, shortness breath, cough or hemoptysis. She has had no bleeding issues.  MEDICAL HISTORY: Past Medical History   Diagnosis Date  . Glaucoma(365)   . Obesity   . Hypertension   . Personal history of colon cancer     next   . Diabetes mellitus   . Hyperlipidemia   . Multiple myeloma(203.0)   . Allergy   . GERD (gastroesophageal reflux disease)   . Herpes     ALLERGIES:  is allergic to codeine.  MEDICATIONS:  Current Outpatient Prescriptions  Medication Sig Dispense Refill  . alendronate (FOSAMAX) 70 MG tablet Take with a full glass of water on an empty stomach.TAKE 1 TAB EACH WEEK 30 MIN PRIOR TO BREAKFAST WITH LARGE GLASS OF WATER. REMAIN UPRIGHT.  4 tablet  3  . aspirin (ASPIRIN LOW DOSE) 81 MG EC tablet Take 81 mg by mouth daily.        . bimatoprost (LUMIGAN) 0.03 % ophthalmic drops Place 1 drop into both eyes at bedtime.        . calcium-vitamin D (OSCAL 500/200 D-3) 500-200 MG-UNIT per tablet Take 1 tablet by mouth 3 (three) times daily.        Marland Kitchen conjugated estrogens (PREMARIN) vaginal cream Place 1 g vaginally. Insert into vaginal 3 times a week        . dexamethasone (DECADRON) 4 MG tablet Take 4 mg by mouth once a week.      . dorzolamide-timolol (COSOPT) 22.3-6.8 MG/ML ophthalmic solution Place 1 drop into both eyes 2 (two) times daily.      Marland Kitchen GLUCOPHAGE 1000 MG tablet TAKE 1 TABLET BY MOUTH TWICE DAILY.  60 tablet  4  . K-DUR 20 MEQ tablet TAKE 1 TABLET BY MOUTH  TWICE DAILY.  60 tablet  4  . Lancets (ONETOUCH ULTRASOFT) lancets TEST ONCE DAILY OR AS DIRECTED.  100 each  6  . lenalidomide (REVLIMID) 15 MG capsule Take one capsule daily for 14 days.  Then 7 days off.  14 capsule  0  . loratadine (QC LORATADINE ALLERGY RELIEF) 10 MG tablet Take 1 tablet (10 mg total) by mouth daily.  30 tablet  3  . Multiple Vitamins-Minerals (CENTRUM SILVER PO) Take by mouth daily.        . NORVASC 2.5 MG tablet TAKE 1 TABLET BY MOUTH ONCE A DAY.  30 tablet  4  . ONE TOUCH ULTRA TEST test strip USE AS DIRECTED TO CHECK DAILY.  50 each  5  . pantoprazole (PROTONIX) 40 MG tablet TAKE ONE TABLET BY  MOUTH EVERY DAY FOR ACID REFLUX.  30 tablet  2  . valACYclovir (VALTREX) 1000 MG tablet Take 1 tablet (1,000 mg total) by mouth daily as needed.  30 tablet  prn  . valsartan-hydrochlorothiazide (DIOVAN HCT) 320-25 MG per tablet Dose increase effective 12/11/2012  Discontinue diovan/HCTZ 320/12.5 effective 12/11/2012  30 tablet  5   No current facility-administered medications for this visit.    SURGICAL HISTORY:  Past Surgical History  Procedure Laterality Date  . Partial hysterectomy fibroids  1980  . Bone marrow transplant      REVIEW OF SYSTEMS:  A comprehensive review of systems was negative except for: Neurological: positive for paresthesia   PHYSICAL EXAMINATION: General appearance: alert, cooperative and no distress Head: Normocephalic, without obvious abnormality, atraumatic Neck: no adenopathy Lymph nodes: Cervical, supraclavicular, and axillary nodes normal. Resp: clear to auscultation bilaterally Cardio: regular rate and rhythm, S1, S2 normal, no murmur, click, rub or gallop GI: soft, non-tender; bowel sounds normal; no masses,  no organomegaly Extremities: extremities normal, atraumatic, no cyanosis or edema Neurologic: Alert and oriented X 3, normal strength and tone. Normal symmetric reflexes. Normal coordination and gait  ECOG PERFORMANCE STATUS: 1 - Symptomatic but completely ambulatory  Blood pressure 138/75, pulse 68, temperature 96.8 F (36 C), temperature source Oral, resp. rate 18, height 5\' 6"  (1.676 m), weight 210 lb 3.2 oz (95.346 kg).  LABORATORY DATA: Lab Results  Component Value Date   WBC 8.7 03/03/2013   HGB 12.3 03/03/2013   HCT 36.8 03/03/2013   MCV 97.4 03/03/2013   PLT 134* 03/03/2013      Chemistry      Component Value Date/Time   NA 141 03/03/2013 1415   NA 139 08/22/2012 1445   K 3.7 03/03/2013 1415   K 4.0 08/22/2012 1445   CL 106 03/03/2013 1415   CL 104 08/22/2012 1445   CO2 26 03/03/2013 1415   CO2 28 08/22/2012 1445   BUN 19.7 03/03/2013 1415    BUN 8 08/22/2012 1445   CREATININE 0.9 03/03/2013 1415   CREATININE 0.78 08/22/2012 1445   CREATININE 0.89 07/17/2012 1200      Component Value Date/Time   CALCIUM 9.6 03/03/2013 1415   CALCIUM 9.7 08/22/2012 1445   ALKPHOS 57 03/03/2013 1415   ALKPHOS 52 08/22/2012 1445   AST 21 03/03/2013 1415   AST 16 08/22/2012 1445   ALT 37 03/03/2013 1415   ALT 30 08/22/2012 1445   BILITOT 1.20 03/03/2013 1415   BILITOT 0.8 08/22/2012 1445       RADIOGRAPHIC STUDIES: No results found.  ASSESSMENT/PLAN: This is a very pleasant 62 years old [place] American female with history of multiple myeloma currently  on Revlimid 15 mg by mouth daily for 2 weeks every 3 weeks in addition to Decadron 4 mg on a weekly basis. The patient is tolerating her treatment fairly well. Her recent myeloma panel revealed relatively stable disease. Patient was discussed with Dr. Truett Perna in Dr. Asa Lente absence She will continue on her Revlimid at 15 mg by mouth daily for 2 weeks every 3 weeks in addition to Decadron 4 mg by mouth once weekly. She'll return in 3 weeks prior to her next scheduled cycle of Revlimid and Decadron.   Verdie Wilms E, PA-C   She was advised to call immediately if she has any concerning symptoms in the interval. All questions were answered. The patient knows to call the clinic with any problems, questions or concerns. We can certainly see the patient much sooner if necessary.  I spent 20 minutes counseling the patient face to face. The total time spent in the appointment was 30 minutes.

## 2013-03-17 ENCOUNTER — Other Ambulatory Visit: Payer: Self-pay | Admitting: *Deleted

## 2013-03-17 NOTE — Telephone Encounter (Signed)
THIS REFILL REQUEST FOR REVLIMID WAS GIVEN TO DR.MOHAMED'S NURSE, STEPHANIE JOHNSON,RN. 

## 2013-03-18 MED ORDER — LENALIDOMIDE 15 MG PO CAPS: ORAL_CAPSULE | ORAL | Status: DC

## 2013-03-18 NOTE — Addendum Note (Signed)
Addended by: Laroy Apple E on: 03/18/2013 09:16 AM   Modules accepted: Orders

## 2013-03-24 ENCOUNTER — Other Ambulatory Visit: Payer: 59 | Admitting: Lab

## 2013-03-24 ENCOUNTER — Other Ambulatory Visit (HOSPITAL_BASED_OUTPATIENT_CLINIC_OR_DEPARTMENT_OTHER): Payer: 59 | Admitting: Lab

## 2013-03-24 ENCOUNTER — Other Ambulatory Visit: Payer: 59

## 2013-03-24 ENCOUNTER — Ambulatory Visit: Payer: 59 | Admitting: Physician Assistant

## 2013-03-24 ENCOUNTER — Telehealth: Payer: Self-pay | Admitting: Internal Medicine

## 2013-03-24 ENCOUNTER — Ambulatory Visit (HOSPITAL_BASED_OUTPATIENT_CLINIC_OR_DEPARTMENT_OTHER): Payer: 59 | Admitting: Physician Assistant

## 2013-03-24 VITALS — BP 145/68 | HR 66 | Temp 98.1°F | Resp 18 | Ht 66.0 in | Wt 210.3 lb

## 2013-03-24 DIAGNOSIS — C9000 Multiple myeloma not having achieved remission: Secondary | ICD-10-CM

## 2013-03-24 LAB — COMPREHENSIVE METABOLIC PANEL (CC13)
Alkaline Phosphatase: 51 U/L (ref 40–150)
CO2: 27 mEq/L (ref 22–29)
Creatinine: 0.8 mg/dL (ref 0.6–1.1)
Glucose: 98 mg/dl (ref 70–99)
Sodium: 142 mEq/L (ref 136–145)
Total Bilirubin: 1.2 mg/dL (ref 0.20–1.20)
Total Protein: 6.9 g/dL (ref 6.4–8.3)

## 2013-03-24 LAB — CBC WITH DIFFERENTIAL/PLATELET
Basophils Absolute: 0 10*3/uL (ref 0.0–0.1)
EOS%: 0.4 % (ref 0.0–7.0)
Eosinophils Absolute: 0 10*3/uL (ref 0.0–0.5)
HCT: 36.5 % (ref 34.8–46.6)
HGB: 12.3 g/dL (ref 11.6–15.9)
LYMPH%: 22.5 % (ref 14.0–49.7)
MONO%: 17 % — ABNORMAL HIGH (ref 0.0–14.0)
NEUT#: 5.1 10*3/uL (ref 1.5–6.5)
RBC: 3.68 10*6/uL — ABNORMAL LOW (ref 3.70–5.45)
RDW: 15 % — ABNORMAL HIGH (ref 11.2–14.5)
WBC: 8.6 10*3/uL (ref 3.9–10.3)

## 2013-03-24 LAB — LACTATE DEHYDROGENASE (CC13): LDH: 143 U/L (ref 125–245)

## 2013-03-24 MED ORDER — DEXAMETHASONE 4 MG PO TABS: 4.0000 mg | ORAL_TABLET | ORAL | Status: DC

## 2013-03-24 NOTE — Patient Instructions (Addendum)
Continue taking your Revlimid and dexamethasone as prescribed Followup with Dr. Arbutus Ped in one month with repeat protein studies to reevaluate your disease

## 2013-03-24 NOTE — Telephone Encounter (Signed)
Gave pt appt for lab and MD on April 2014 °

## 2013-03-26 NOTE — Progress Notes (Signed)
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 2) the patient had evidence for disease recurrence in August of 2008 and she was treated with 6 cycles of Velcade completed on 03/30/2008 with response to the treatment but was discontinued secondary to neuropathy. 3) maintenance treatment with Revlimid 10 mg by mouth daily by the does was later increase to 25 mg by mouth daily secondary to biochemical recurrence with stabilization of her disease.  CURRENT THERAPY: Revlimid 15 mg by mouth daily for 2 weeks every 3 weeks in addition to Decadron currently at 4 mg by mouth on a weekly basis. The patient will start the next cycle of her chemotherapy with Revlimid today.  INTERVAL HISTORY: COLIE JOSTEN 62 y.o. female returns to the clinic today for followup visit.The patient is feeling fine today with no new specific complaints. She is tolerating her treatment with Revlimid and Decadron fairly well with no significant adverse effects. She continues to have mild numbness and tingling is in the lower extremities but this has been an ongoing issue since her treatment with Velcade. She denied having any significant fatigue or weakness. She denied having any nausea or vomiting. The patient denied having any significant weight loss or night sweats. She denied having any chest pain, shortness breath, cough or hemoptysis. She has had no bleeding issues. She requests a refill for her dexamethasone.  MEDICAL HISTORY: Past Medical History  Diagnosis Date  . Glaucoma(365)   . Obesity   . Hypertension   . Personal history of colon  cancer     next   . Diabetes mellitus   . Hyperlipidemia   . Multiple myeloma(203.0)   . Allergy   . GERD (gastroesophageal reflux disease)   . Herpes     ALLERGIES:  is allergic to codeine.  MEDICATIONS:  Current Outpatient Prescriptions  Medication Sig Dispense Refill  . alendronate (FOSAMAX) 70 MG tablet Take with a full glass of water on an empty stomach.TAKE 1 TAB EACH WEEK 30 MIN PRIOR TO BREAKFAST WITH LARGE GLASS OF WATER. REMAIN UPRIGHT.  4 tablet  3  . aspirin (ASPIRIN LOW DOSE) 81 MG EC tablet Take 81 mg by mouth daily.        . bimatoprost (LUMIGAN) 0.03 % ophthalmic drops Place 1 drop into both eyes at bedtime.        . calcium-vitamin D (OSCAL 500/200 D-3) 500-200 MG-UNIT per tablet Take 1 tablet by mouth 3 (three) times daily.        Marland Kitchen conjugated estrogens (PREMARIN) vaginal cream Place 1 g vaginally. Insert into vaginal 3 times a week        . dexamethasone (DECADRON) 4 MG tablet Take 1 tablet (4 mg total) by mouth once a week.  30 tablet  1  . dorzolamide-timolol (COSOPT) 22.3-6.8 MG/ML ophthalmic solution Place 1 drop into both eyes 2 (two) times daily.      Marland Kitchen GLUCOPHAGE 1000 MG tablet TAKE 1 TABLET BY MOUTH TWICE DAILY.  60 tablet  4  . K-DUR 20 MEQ tablet TAKE 1 TABLET BY MOUTH TWICE DAILY.  60 tablet  4  . Lancets (ONETOUCH ULTRASOFT) lancets TEST  ONCE DAILY OR AS DIRECTED.  100 each  6  . lenalidomide (REVLIMID) 15 MG capsule Take one capsule daily for 14 days.  Then 7 days off.  14 capsule  0  . loratadine (QC LORATADINE ALLERGY RELIEF) 10 MG tablet Take 1 tablet (10 mg total) by mouth daily.  30 tablet  3  . Multiple Vitamins-Minerals (CENTRUM SILVER PO) Take by mouth daily.        . NORVASC 2.5 MG tablet TAKE 1 TABLET BY MOUTH ONCE A DAY.  30 tablet  4  . ONE TOUCH ULTRA TEST test strip USE AS DIRECTED TO CHECK DAILY.  50 each  5  . pantoprazole (PROTONIX) 40 MG tablet TAKE ONE TABLET BY MOUTH EVERY DAY FOR ACID REFLUX.  30 tablet  2  . valACYclovir (VALTREX)  1000 MG tablet Take 1 tablet (1,000 mg total) by mouth daily as needed.  30 tablet  prn  . valsartan-hydrochlorothiazide (DIOVAN HCT) 320-25 MG per tablet Dose increase effective 12/11/2012  Discontinue diovan/HCTZ 320/12.5 effective 12/11/2012  30 tablet  5   No current facility-administered medications for this visit.    SURGICAL HISTORY:  Past Surgical History  Procedure Laterality Date  . Partial hysterectomy fibroids  1980  . Bone marrow transplant      REVIEW OF SYSTEMS:  A comprehensive review of systems was negative except for: Neurological: positive for paresthesia   PHYSICAL EXAMINATION: General appearance: alert, cooperative and no distress Head: Normocephalic, without obvious abnormality, atraumatic Neck: no adenopathy Lymph nodes: Cervical, supraclavicular, and axillary nodes normal. Resp: clear to auscultation bilaterally Cardio: regular rate and rhythm, S1, S2 normal, no murmur, click, rub or gallop GI: soft, non-tender; bowel sounds normal; no masses,  no organomegaly Extremities: extremities normal, atraumatic, no cyanosis or edema Neurologic: Alert and oriented X 3, normal strength and tone. Normal symmetric reflexes. Normal coordination and gait  ECOG PERFORMANCE STATUS: 1 - Symptomatic but completely ambulatory  Blood pressure 145/68, pulse 66, temperature 98.1 F (36.7 C), temperature source Oral, resp. rate 18, height 5\' 6"  (1.676 m), weight 210 lb 4.8 oz (95.391 kg).  LABORATORY DATA: Lab Results  Component Value Date   WBC 8.6 03/24/2013   HGB 12.3 03/24/2013   HCT 36.5 03/24/2013   MCV 99.2 03/24/2013   PLT 132* 03/24/2013      Chemistry      Component Value Date/Time   NA 142 03/24/2013 1420   NA 139 08/22/2012 1445   K 3.5 03/24/2013 1420   K 4.0 08/22/2012 1445   CL 104 03/24/2013 1420   CL 104 08/22/2012 1445   CO2 27 03/24/2013 1420   CO2 28 08/22/2012 1445   BUN 15.5 03/24/2013 1420   BUN 8 08/22/2012 1445   CREATININE 0.8 03/24/2013 1420    CREATININE 0.78 08/22/2012 1445   CREATININE 0.89 07/17/2012 1200      Component Value Date/Time   CALCIUM 9.9 03/24/2013 1420   CALCIUM 9.7 08/22/2012 1445   ALKPHOS 51 03/24/2013 1420   ALKPHOS 52 08/22/2012 1445   AST 17 03/24/2013 1420   AST 16 08/22/2012 1445   ALT 28 03/24/2013 1420   ALT 30 08/22/2012 1445   BILITOT 1.20 03/24/2013 1420   BILITOT 0.8 08/22/2012 1445       RADIOGRAPHIC STUDIES: No results found.  ASSESSMENT/PLAN: This is a very pleasant 62 years old [place] American female with history of multiple myeloma currently on Revlimid 15 mg by mouth daily for 2 weeks every 3 weeks in  addition to Decadron 4 mg on a weekly basis. The patient is tolerating her treatment fairly well. Her last myeloma panel revealed relatively stable disease. Patient was discussed with  Dr. Arbutus Ped. She will continue on her Revlimid at 15 mg by mouth daily for 2 weeks every 3 weeks in addition to Decadron 4 mg by mouth once weekly. A refill for her dexamethasone was sent to her pharmacy of record via E. scribed. She'll followup with Dr. Arbutus Ped in 4 weeks with repeat protein studies to reevaluate her disease. The studies will include a quantitative immunoglobin, beta 2 microglobulin, and serum light chains in addition to a CBC differential, C. met and LDH.   Devaris Quirk E, PA-C   She was advised to call immediately if she has any concerning symptoms in the interval. All questions were answered. The patient knows to call the clinic with any problems, questions or concerns. We can certainly see the patient much sooner if necessary.  I spent 20 minutes counseling the patient face to face. The total time spent in the appointment was 30 minutes.

## 2013-03-27 NOTE — Telephone Encounter (Signed)
Patient states that problem has been fixed.

## 2013-03-31 ENCOUNTER — Other Ambulatory Visit: Payer: Self-pay

## 2013-03-31 MED ORDER — AMLODIPINE BESYLATE 2.5 MG PO TABS: ORAL_TABLET | ORAL | Status: DC

## 2013-03-31 MED ORDER — LORATADINE 10 MG PO TABS: 10.0000 mg | ORAL_TABLET | Freq: Every day | ORAL | Status: DC

## 2013-03-31 MED ORDER — METFORMIN HCL 1000 MG PO TABS: ORAL_TABLET | ORAL | Status: DC

## 2013-04-08 ENCOUNTER — Other Ambulatory Visit: Payer: Self-pay | Admitting: *Deleted

## 2013-04-08 DIAGNOSIS — C9000 Multiple myeloma not having achieved remission: Secondary | ICD-10-CM

## 2013-04-08 MED ORDER — LENALIDOMIDE 15 MG PO CAPS: ORAL_CAPSULE | ORAL | Status: DC

## 2013-04-14 ENCOUNTER — Ambulatory Visit: Payer: 59 | Admitting: Physician Assistant

## 2013-04-14 ENCOUNTER — Other Ambulatory Visit (HOSPITAL_BASED_OUTPATIENT_CLINIC_OR_DEPARTMENT_OTHER): Payer: 59 | Admitting: Lab

## 2013-04-14 ENCOUNTER — Other Ambulatory Visit: Payer: 59 | Admitting: Lab

## 2013-04-14 DIAGNOSIS — C9000 Multiple myeloma not having achieved remission: Secondary | ICD-10-CM

## 2013-04-14 LAB — CBC WITH DIFFERENTIAL/PLATELET
BASO%: 0.8 % (ref 0.0–2.0)
EOS%: 1.3 % (ref 0.0–7.0)
MCH: 32.9 pg (ref 25.1–34.0)
MCHC: 33.1 g/dL (ref 31.5–36.0)
MONO#: 1 10*3/uL — ABNORMAL HIGH (ref 0.1–0.9)
RBC: 3.6 10*6/uL — ABNORMAL LOW (ref 3.70–5.45)
RDW: 15.4 % — ABNORMAL HIGH (ref 11.2–14.5)
WBC: 6 10*3/uL (ref 3.9–10.3)
lymph#: 1.6 10*3/uL (ref 0.9–3.3)

## 2013-04-14 LAB — COMPREHENSIVE METABOLIC PANEL (CC13)
ALT: 25 U/L (ref 0–55)
Albumin: 3.3 g/dL — ABNORMAL LOW (ref 3.5–5.0)
Alkaline Phosphatase: 54 U/L (ref 40–150)
CO2: 28 mEq/L (ref 22–29)
Potassium: 3.7 mEq/L (ref 3.5–5.1)
Sodium: 140 mEq/L (ref 136–145)
Total Bilirubin: 1.18 mg/dL (ref 0.20–1.20)
Total Protein: 6.8 g/dL (ref 6.4–8.3)

## 2013-04-14 LAB — LACTATE DEHYDROGENASE (CC13): LDH: 137 U/L (ref 125–245)

## 2013-04-16 LAB — SPEP & IFE WITH QIG
Alpha-1-Globulin: 4 % (ref 2.9–4.9)
Gamma Globulin: 6.4 % — ABNORMAL LOW (ref 11.1–18.8)
IgM, Serum: 14 mg/dL — ABNORMAL LOW (ref 52–322)
Total Protein, Serum Electrophoresis: 6.7 g/dL (ref 6.0–8.3)

## 2013-04-16 LAB — BETA 2 MICROGLOBULIN, SERUM: Beta-2 Microglobulin: 2.23 mg/L — ABNORMAL HIGH (ref 1.01–1.73)

## 2013-04-16 LAB — KAPPA/LAMBDA LIGHT CHAINS: Kappa:Lambda Ratio: 1.28 (ref 0.26–1.65)

## 2013-04-21 ENCOUNTER — Ambulatory Visit (HOSPITAL_BASED_OUTPATIENT_CLINIC_OR_DEPARTMENT_OTHER): Payer: 59 | Admitting: Internal Medicine

## 2013-04-21 ENCOUNTER — Telehealth: Payer: Self-pay | Admitting: Internal Medicine

## 2013-04-21 ENCOUNTER — Encounter: Payer: Self-pay | Admitting: Internal Medicine

## 2013-04-21 VITALS — BP 127/73 | HR 82 | Temp 97.0°F | Resp 18 | Ht 66.0 in | Wt 210.8 lb

## 2013-04-21 DIAGNOSIS — C9000 Multiple myeloma not having achieved remission: Secondary | ICD-10-CM

## 2013-04-21 NOTE — Progress Notes (Signed)
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  2) the patient had evidence for disease recurrence in August of 2008 and she was treated with 6 cycles of Velcade completed on 03/30/2008 with response to the treatment but was discontinued secondary to neuropathy.  3) maintenance treatment with Revlimid 10 mg by mouth daily by the does was later increase to 25 mg by mouth daily secondary to biochemical recurrence with stabilization of her disease.   CURRENT THERAPY: Revlimid 15 mg by mouth daily for 2 weeks every 3 weeks in addition to Decadron currently at 4 mg by mouth on a weekly basis.   INTERVAL HISTORY: Darlene Howard 62 y.o. female returns to the clinic today for followup visit. The patient is tolerating her treatment with Revlimid and Decadron fairly well with no significant adverse effects. She denied having any significant peripheral neuropathy. She denied having any constipation. The patient denied having any nausea or vomiting. She has no chest pain, shortness breath, cough or hemoptysis. She had myeloma panel performed recently and she is here for evaluation and discussion of her lab results.  MEDICAL HISTORY: Past Medical History  Diagnosis Date  . Glaucoma(365)   . Obesity   . Hypertension   . Personal history of colon cancer     next   . Diabetes mellitus   . Hyperlipidemia   . Multiple myeloma(203.0)   . Allergy   . GERD (gastroesophageal reflux disease)   . Herpes     ALLERGIES:  is allergic to codeine.  MEDICATIONS:  Current Outpatient Prescriptions  Medication Sig Dispense Refill  .  alendronate (FOSAMAX) 70 MG tablet Take with a full glass of water on an empty stomach.TAKE 1 TAB EACH WEEK 30 MIN PRIOR TO BREAKFAST WITH LARGE GLASS OF WATER. REMAIN UPRIGHT.  4 tablet  3  . amLODipine (NORVASC) 2.5 MG tablet TAKE 1 TABLET BY MOUTH ONCE A DAY.  30 tablet  4  . aspirin (ASPIRIN LOW DOSE) 81 MG EC tablet Take 81 mg by mouth daily.        . bimatoprost (LUMIGAN) 0.03 % ophthalmic drops Place 1 drop into both eyes at bedtime.        . calcium-vitamin D (OSCAL 500/200 D-3) 500-200 MG-UNIT per tablet Take 1 tablet by mouth 3 (three) times daily.        Marland Kitchen conjugated estrogens (PREMARIN) vaginal cream Place 1 g vaginally. Insert into vaginal 3 times a week        . dexamethasone (DECADRON) 4 MG tablet Take 1 tablet (4 mg total) by mouth once a week.  30 tablet  1  . dorzolamide-timolol (COSOPT) 22.3-6.8 MG/ML ophthalmic solution Place 1 drop into both eyes 2 (two) times daily.      Marland Kitchen K-DUR 20 MEQ tablet TAKE 1 TABLET BY MOUTH TWICE DAILY.  60 tablet  4  . Lancets (ONETOUCH ULTRASOFT) lancets TEST ONCE DAILY OR AS DIRECTED.  100 each  6  . lenalidomide (REVLIMID) 15 MG capsule Take one capsule daily for 14 days.  Then 7 days off.  14 capsule  0  . loratadine (QC LORATADINE ALLERGY RELIEF)  10 MG tablet Take 1 tablet (10 mg total) by mouth daily.  30 tablet  4  . metFORMIN (GLUCOPHAGE) 1000 MG tablet TAKE 1 TABLET BY MOUTH TWICE DAILY.  60 tablet  4  . Multiple Vitamins-Minerals (CENTRUM SILVER PO) Take by mouth daily.        . ONE TOUCH ULTRA TEST test strip USE AS DIRECTED TO CHECK DAILY.  50 each  5  . pantoprazole (PROTONIX) 40 MG tablet TAKE ONE TABLET BY MOUTH EVERY DAY FOR ACID REFLUX.  30 tablet  2  . valACYclovir (VALTREX) 1000 MG tablet Take 1 tablet (1,000 mg total) by mouth daily as needed.  30 tablet  prn  . valsartan-hydrochlorothiazide (DIOVAN HCT) 320-25 MG per tablet Dose increase effective 12/11/2012  Discontinue diovan/HCTZ 320/12.5 effective 12/11/2012  30 tablet  5    No current facility-administered medications for this visit.    SURGICAL HISTORY:  Past Surgical History  Procedure Laterality Date  . Partial hysterectomy fibroids  1980  . Bone marrow transplant      REVIEW OF SYSTEMS:  A comprehensive review of systems was negative.   PHYSICAL EXAMINATION: General appearance: alert, cooperative and no distress Head: Normocephalic, without obvious abnormality, atraumatic Neck: no adenopathy Lymph nodes: Cervical, supraclavicular, and axillary nodes normal. Resp: clear to auscultation bilaterally Cardio: regular rate and rhythm, S1, S2 normal, no murmur, click, rub or gallop GI: soft, non-tender; bowel sounds normal; no masses,  no organomegaly Extremities: extremities normal, atraumatic, no cyanosis or edema  ECOG PERFORMANCE STATUS: 1 - Symptomatic but completely ambulatory  Blood pressure 127/73, pulse 82, temperature 97 F (36.1 C), temperature source Oral, resp. rate 18, height 5\' 6"  (1.676 m), weight 210 lb 12.8 oz (95.618 kg).  LABORATORY DATA: Lab Results  Component Value Date   WBC 6.0 04/14/2013   HGB 11.9 04/14/2013   HCT 35.8 04/14/2013   MCV 99.3 04/14/2013   PLT 123* 04/14/2013      Chemistry      Component Value Date/Time   NA 140 04/14/2013 1438   NA 139 08/22/2012 1445   K 3.7 04/14/2013 1438   K 4.0 08/22/2012 1445   CL 103 04/14/2013 1438   CL 104 08/22/2012 1445   CO2 28 04/14/2013 1438   CO2 28 08/22/2012 1445   BUN 11.3 04/14/2013 1438   BUN 8 08/22/2012 1445   CREATININE 0.8 04/14/2013 1438   CREATININE 0.78 08/22/2012 1445   CREATININE 0.89 07/17/2012 1200      Component Value Date/Time   CALCIUM 9.5 04/14/2013 1438   CALCIUM 9.7 08/22/2012 1445   ALKPHOS 54 04/14/2013 1438   ALKPHOS 52 08/22/2012 1445   AST 21 04/14/2013 1438   AST 16 08/22/2012 1445   ALT 25 04/14/2013 1438   ALT 30 08/22/2012 1445   BILITOT 1.18 04/14/2013 1438   BILITOT 0.8 08/22/2012 1445       RADIOGRAPHIC STUDIES: No results  found.  ASSESSMENT: This is a very pleasant 62 years old [place] American female with history of multiple myeloma currently on maintenance Revlimid 15 mg by mouth daily for 2 weeks every 3 weeks in addition to a small dose of Decadron. The patient is doing fine today and she has no evidence for disease progression on the myeloma panel.  PLAN: I discussed the lab result with the patient today. I gave her the option of continuing treatment with maintenance Revlimid versus take a break from chemotherapy. I also reminded the patient the concern about secondary malignancy  including but not limited to leukemia, lung cancer with long term use of Revlimid.  The patient would like to continue her treatment with Revlimid as scheduled. She would come back for followup visit in one month's for reevaluation. She was advised to call immediately if she has any concerning symptoms in the interval.  All questions were answered. The patient knows to call the clinic with any problems, questions or concerns. We can certainly see the patient much sooner if necessary.

## 2013-04-21 NOTE — Patient Instructions (Addendum)
Your Myeloma panel showed stable disease. Followup visit in 4 weeks

## 2013-04-21 NOTE — Telephone Encounter (Signed)
gv pt appt schedule for May. °

## 2013-04-24 ENCOUNTER — Other Ambulatory Visit: Payer: Self-pay | Admitting: *Deleted

## 2013-04-27 ENCOUNTER — Telehealth: Payer: Self-pay | Admitting: Medical Oncology

## 2013-04-27 DIAGNOSIS — C9000 Multiple myeloma not having achieved remission: Secondary | ICD-10-CM

## 2013-04-27 MED ORDER — LENALIDOMIDE 15 MG PO CAPS: ORAL_CAPSULE | ORAL | Status: DC

## 2013-04-27 NOTE — Telephone Encounter (Signed)
Faxed revlimied refill to [facility]

## 2013-04-28 ENCOUNTER — Other Ambulatory Visit: Payer: Self-pay | Admitting: Family Medicine

## 2013-05-05 ENCOUNTER — Ambulatory Visit: Payer: 59 | Admitting: Physician Assistant

## 2013-05-05 ENCOUNTER — Other Ambulatory Visit (HOSPITAL_BASED_OUTPATIENT_CLINIC_OR_DEPARTMENT_OTHER): Payer: 59 | Admitting: Lab

## 2013-05-05 ENCOUNTER — Other Ambulatory Visit: Payer: 59 | Admitting: Lab

## 2013-05-05 DIAGNOSIS — C9000 Multiple myeloma not having achieved remission: Secondary | ICD-10-CM

## 2013-05-05 LAB — CBC WITH DIFFERENTIAL/PLATELET
BASO%: 0.3 % (ref 0.0–2.0)
Eosinophils Absolute: 0 10*3/uL (ref 0.0–0.5)
HCT: 36.5 % (ref 34.8–46.6)
MCHC: 33.1 g/dL (ref 31.5–36.0)
MONO#: 1.5 10*3/uL — ABNORMAL HIGH (ref 0.1–0.9)
NEUT#: 6.3 10*3/uL (ref 1.5–6.5)
Platelets: 134 10*3/uL — ABNORMAL LOW (ref 145–400)
RBC: 3.65 10*6/uL — ABNORMAL LOW (ref 3.70–5.45)
WBC: 9.8 10*3/uL (ref 3.9–10.3)
lymph#: 1.9 10*3/uL (ref 0.9–3.3)

## 2013-05-05 LAB — COMPREHENSIVE METABOLIC PANEL (CC13)
ALT: 31 U/L (ref 0–55)
AST: 23 U/L (ref 5–34)
Albumin: 3.5 g/dL (ref 3.5–5.0)
CO2: 27 mEq/L (ref 22–29)
Calcium: 9.4 mg/dL (ref 8.4–10.4)
Chloride: 105 mEq/L (ref 98–107)
Potassium: 3.6 mEq/L (ref 3.5–5.1)
Total Protein: 7.1 g/dL (ref 6.4–8.3)

## 2013-05-05 LAB — LACTATE DEHYDROGENASE (CC13): LDH: 142 U/L (ref 125–245)

## 2013-05-12 ENCOUNTER — Ambulatory Visit: Payer: 59 | Admitting: Family Medicine

## 2013-05-14 LAB — BASIC METABOLIC PANEL
CO2: 28 mEq/L (ref 19–32)
Glucose, Bld: 118 mg/dL — ABNORMAL HIGH (ref 70–99)
Potassium: 3.9 mEq/L (ref 3.5–5.3)
Sodium: 139 mEq/L (ref 135–145)

## 2013-05-18 ENCOUNTER — Other Ambulatory Visit: Payer: Self-pay | Admitting: Family Medicine

## 2013-05-19 ENCOUNTER — Telehealth: Payer: Self-pay | Admitting: Internal Medicine

## 2013-05-19 ENCOUNTER — Other Ambulatory Visit: Payer: Self-pay | Admitting: *Deleted

## 2013-05-19 ENCOUNTER — Ambulatory Visit (HOSPITAL_BASED_OUTPATIENT_CLINIC_OR_DEPARTMENT_OTHER): Payer: 59 | Admitting: Internal Medicine

## 2013-05-19 ENCOUNTER — Other Ambulatory Visit (HOSPITAL_BASED_OUTPATIENT_CLINIC_OR_DEPARTMENT_OTHER): Payer: 59 | Admitting: Lab

## 2013-05-19 ENCOUNTER — Encounter: Payer: Self-pay | Admitting: Internal Medicine

## 2013-05-19 VITALS — BP 125/65 | HR 79 | Temp 97.0°F | Resp 18 | Ht 66.0 in | Wt 210.9 lb

## 2013-05-19 DIAGNOSIS — C9000 Multiple myeloma not having achieved remission: Secondary | ICD-10-CM

## 2013-05-19 LAB — CBC WITH DIFFERENTIAL/PLATELET
BASO%: 0.5 % (ref 0.0–2.0)
EOS%: 1.5 % (ref 0.0–7.0)
MCH: 33.1 pg (ref 25.1–34.0)
MCHC: 33.2 g/dL (ref 31.5–36.0)
NEUT%: 66.7 % (ref 38.4–76.8)
RBC: 3.53 10*6/uL — ABNORMAL LOW (ref 3.70–5.45)
RDW: 15 % — ABNORMAL HIGH (ref 11.2–14.5)
WBC: 9.8 10*3/uL (ref 3.9–10.3)
lymph#: 1.6 10*3/uL (ref 0.9–3.3)

## 2013-05-19 LAB — COMPREHENSIVE METABOLIC PANEL (CC13)
AST: 19 U/L (ref 5–34)
Albumin: 3.4 g/dL — ABNORMAL LOW (ref 3.5–5.0)
BUN: 14.1 mg/dL (ref 7.0–26.0)
Calcium: 9.3 mg/dL (ref 8.4–10.4)
Chloride: 104 mEq/L (ref 98–107)
Glucose: 134 mg/dl — ABNORMAL HIGH (ref 70–99)
Potassium: 3.7 mEq/L (ref 3.5–5.1)

## 2013-05-19 MED ORDER — WARFARIN SODIUM 2 MG PO TABS: 2.0000 mg | ORAL_TABLET | Freq: Every day | ORAL | Status: DC

## 2013-05-19 NOTE — Patient Instructions (Signed)
Continue treatment with Revlimid. Followup visit in one month 

## 2013-05-19 NOTE — Progress Notes (Signed)
[facility] Telephone:(336) [phone]   Fax:(336) [phone]  OFFICE PROGRESS NOTE  [name], MD [address] [place] [address]  DIAGNOSIS: Stage IIA IgA kappa multiple myeloma diagnosed in 2006   PRIOR THERAPY:  1) status post treatment with thalidomide and Decadron followed by autologous peripheral blood stem cell transplant with high-dose melphalan on 04/26/2006 at [facility].  2) the patient had evidence for disease recurrence in August of 2008 and she was treated with 6 cycles of Velcade completed on 03/30/2008 with response to the treatment but was discontinued secondary to neuropathy.  3) maintenance treatment with Revlimid 10 mg by mouth daily by the does was later increase to 25 mg by mouth daily secondary to biochemical recurrence with stabilization of her disease.   CURRENT THERAPY: Revlimid 15 mg by mouth daily for 2 weeks every 3 weeks in addition to Decadron currently at 4 mg by mouth on a weekly basis.    INTERVAL HISTORY: Darlene Howard 62 y.o. female returns to the clinic today for followup visit. The patient is feeling fine today with no specific complaints. She denied having any significant fatigue weakness. She has no peripheral neuropathy. He denied having any nausea or vomiting. She has no chest pain, shortness breath, cough or hemoptysis. She is here today for evaluation of her condition and management any adverse effect of her treatment with Revlimid.  MEDICAL HISTORY: Past Medical History  Diagnosis Date  . Glaucoma(365)   . Obesity   . Hypertension   . Personal history of colon cancer     next   . Diabetes mellitus   . Hyperlipidemia   . Multiple myeloma(203.0)   . Allergy   . GERD (gastroesophageal reflux disease)   . Herpes     ALLERGIES:  is allergic to codeine.  MEDICATIONS:  Current Outpatient Prescriptions  Medication Sig Dispense Refill  . alendronate (FOSAMAX) 70 MG tablet TAKE 1 TAB EACH WEEK  30 MIN PRIOR TO BREAKFAST WITH LARGE GLASS OF WATER ON AN EMPTY STOMACH. REMAIN UPRIGHT.  4 tablet  1  . amLODipine (NORVASC) 2.5 MG tablet TAKE 1 TABLET BY MOUTH ONCE A DAY.  30 tablet  4  . aspirin (ASPIRIN LOW DOSE) 81 MG EC tablet Take 81 mg by mouth daily.        . bimatoprost (LUMIGAN) 0.03 % ophthalmic drops Place 1 drop into both eyes at bedtime.        . calcium-vitamin D (OSCAL 500/200 D-3) 500-200 MG-UNIT per tablet Take 1 tablet by mouth 3 (three) times daily.        Marland Kitchen conjugated estrogens (PREMARIN) vaginal cream Place 1 g vaginally. Insert into vaginal 3 times a week        . dexamethasone (DECADRON) 4 MG tablet Take 1 tablet (4 mg total) by mouth once a week.  30 tablet  1  . dorzolamide-timolol (COSOPT) 22.3-6.8 MG/ML ophthalmic solution Place 1 drop into both eyes 2 (two) times daily.      . Lancets (ONETOUCH ULTRASOFT) lancets TEST ONCE DAILY OR AS DIRECTED.  100 each  6  . lenalidomide (REVLIMID) 15 MG capsule Take one capsule daily for 14 days.  Then 7 days off.  14 capsule  0  . loratadine (CLARITIN) 10 MG tablet TAKE ONE TABLET BY MOUTH ONCE DAILY.  30 tablet  2  . metFORMIN (GLUCOPHAGE) 1000 MG tablet TAKE 1 TABLET BY MOUTH TWICE DAILY.  60 tablet  4  . Multiple Vitamins-Minerals (CENTRUM SILVER PO) Take by mouth daily.        . ONE TOUCH ULTRA TEST test strip USE AS DIRECTED TO CHECK DAILY.  50 each  5  . pantoprazole (PROTONIX) 40 MG tablet TAKE ONE TABLET BY MOUTH EVERY DAY FOR ACID REFLUX.  30 tablet  2  . potassium chloride SA (K-DUR,KLOR-CON) 20 MEQ tablet TAKE 1 TABLET BY MOUTH TWICE DAILY.  60 tablet  2  . valACYclovir (VALTREX) 1000 MG tablet Take 1 tablet (1,000 mg total) by mouth daily as needed.  30 tablet  prn  . valsartan-hydrochlorothiazide (DIOVAN HCT) 320-25 MG per tablet Dose increase effective 12/11/2012  Discontinue diovan/HCTZ 320/12.5 effective 12/11/2012  30 tablet  5  . warfarin (COUMADIN) 2 MG tablet Take 1 tablet (2 mg total) by mouth daily.  30  tablet  3   No current facility-administered medications for this visit.    SURGICAL HISTORY:  Past Surgical History  Procedure Laterality Date  . Partial hysterectomy fibroids  1980  . Bone marrow transplant      REVIEW OF SYSTEMS:  A comprehensive review of systems was negative.   PHYSICAL EXAMINATION: General appearance: alert, cooperative and no distress Head: Normocephalic, without obvious abnormality, atraumatic Neck: no adenopathy Lymph nodes: Cervical, supraclavicular, and axillary nodes normal. Resp: clear to auscultation bilaterally Cardio: regular rate and rhythm, S1, S2 normal, no murmur, click, rub or gallop GI: soft, non-tender; bowel sounds normal; no masses,  no organomegaly Extremities: extremities normal, atraumatic, no cyanosis or edema  ECOG PERFORMANCE STATUS: 0 - Asymptomatic  Blood pressure 125/65, pulse 79, temperature 97 F (36.1 C), temperature source Oral, resp. rate 18, height 5\' 6"  (1.676 m), weight 210 lb 14.4 oz (95.664 kg).  LABORATORY DATA: Lab Results  Component Value Date   WBC 9.8 05/19/2013   HGB 11.7 05/19/2013   HCT 35.2 05/19/2013   MCV 99.7 05/19/2013   PLT 152 05/19/2013      Chemistry      Component Value Date/Time   NA 139 05/14/2013 1215   NA 141 05/05/2013 1434   K 3.9 05/14/2013 1215   K 3.6 05/05/2013 1434   CL 101 05/14/2013 1215   CL 105 05/05/2013 1434   CO2 28 05/14/2013 1215   CO2 27 05/05/2013 1434   BUN 11 05/14/2013 1215   BUN 17.7 05/05/2013 1434   CREATININE 0.86 05/14/2013 1215   CREATININE 0.8 05/05/2013 1434   CREATININE 0.78 08/22/2012 1445      Component Value Date/Time   CALCIUM 9.9 05/14/2013 1215   CALCIUM 9.4 05/05/2013 1434   ALKPHOS 51 05/05/2013 1434   ALKPHOS 52 08/22/2012 1445   AST 23 05/05/2013 1434   AST 16 08/22/2012 1445   ALT 31 05/05/2013 1434   ALT 30 08/22/2012 1445   BILITOT 1.23* 05/05/2013 1434   BILITOT 0.8 08/22/2012 1445       RADIOGRAPHIC STUDIES: No results found.  ASSESSMENT: This is a very  pleasant 62 years old [place] American female with history of stage II a multiple myeloma currently on maintenance Revlimid 15 mg by mouth daily in addition to Decadron 4 mg on a weekly basis.  PLAN: The patient is tolerating her treatment fairly well with no significant adverse effects. I recommended for her to continue treatment with Revlimid and Decadron with the same dose. I recommended for the patient to start on prophylactic dose of Coumadin 2 mg by mouth daily. She would come back for followup  visit in one month for reevaluation. She was advised to call immediately if she has any concerning symptoms in the interval.  All questions were answered. The patient knows to call the clinic with any problems, questions or concerns. We can certainly see the patient much sooner if necessary.

## 2013-05-19 NOTE — Telephone Encounter (Signed)
THIS REFILL REQUEST FOR REVLIMID WAS GIVEN TO DR.MOHAMED'S NURSE, STEPHANIE JOHNSON,RN. 

## 2013-05-19 NOTE — Telephone Encounter (Signed)
Gave pt appt for lab and MD for June 2014

## 2013-05-20 ENCOUNTER — Encounter: Payer: Self-pay | Admitting: Family Medicine

## 2013-05-20 ENCOUNTER — Ambulatory Visit (INDEPENDENT_AMBULATORY_CARE_PROVIDER_SITE_OTHER): Payer: 59 | Admitting: Family Medicine

## 2013-05-20 VITALS — BP 120/70 | HR 82 | Resp 18 | Ht 66.0 in | Wt 210.1 lb

## 2013-05-20 DIAGNOSIS — E669 Obesity, unspecified: Secondary | ICD-10-CM

## 2013-05-20 DIAGNOSIS — C9000 Multiple myeloma not having achieved remission: Secondary | ICD-10-CM

## 2013-05-20 DIAGNOSIS — I10 Essential (primary) hypertension: Secondary | ICD-10-CM

## 2013-05-20 DIAGNOSIS — J309 Allergic rhinitis, unspecified: Secondary | ICD-10-CM

## 2013-05-20 DIAGNOSIS — E119 Type 2 diabetes mellitus without complications: Secondary | ICD-10-CM

## 2013-05-20 MED ORDER — POTASSIUM CHLORIDE CRYS ER 20 MEQ PO TBCR: EXTENDED_RELEASE_TABLET | ORAL | Status: DC

## 2013-05-20 MED ORDER — PANTOPRAZOLE SODIUM 40 MG PO TBEC: DELAYED_RELEASE_TABLET | ORAL | Status: DC

## 2013-05-20 MED ORDER — LENALIDOMIDE 15 MG PO CAPS: ORAL_CAPSULE | ORAL | Status: DC

## 2013-05-20 NOTE — Progress Notes (Signed)
  Subjective:    Patient ID: Darlene Howard, female    DOB: 1951/06/04, 62 y.o.   MRN: [id number]  HPI The PT is here for follow up and re-evaluation of chronic medical conditions, medication management and review of any available recent lab and radiology data.  Preventive health is updated, specifically  Cancer screening and Immunization.   Questions or concerns regarding consultations or procedures which the PT has had in the interim are  Addressed.Is now seeing a new oncologist who recommends coumadin, she has questions as to whether I think this is a good idea, at the dose recommended, she will not need monitoring The PT denies any adverse reactions to current medications since the last visit.   There are no specific complaints Denies reguklar exercise and has gained weight, blood sugar control remains good however      Review of Systems See HPI Denies recent fever or chills. Denies sinus pressure, nasal congestion, ear pain or sore throat. Denies chest congestion, productive cough or wheezing. Denies chest pains, palpitations and leg swelling Denies abdominal pain, nausea, vomiting,diarrhea or constipation.   Denies dysuria, frequency, hesitancy or incontinence. Denies joint pain, swelling and limitation in mobility. Denies headaches, seizures, numbness, or tingling. Denies depression, anxiety or insomnia. Denies skin break down or rash.        Objective:   Physical Exam    Patient alert and oriented and in no cardiopulmonary distress.  HEENT: No facial asymmetry, EOMI, no sinus tenderness,  oropharynx pink and moist.  Neck supple no adenopathy.  Chest: Clear to auscultation bilaterally.  CVS: S1, S2 no murmurs, no S3.  ABD: Soft non tender. Bowel sounds normal.  Ext: No edema  MS: Adequate ROM spine, shoulders, hips and knees.  Skin: Intact, no ulcerations or rash noted.  Psych: Good eye contact, normal affect. Memory intact not anxious or depressed  appearing.  CNS: CN 2-12 intact, power, tone and sensation normal throughout.     Assessment & Plan:

## 2013-05-20 NOTE — Patient Instructions (Addendum)
F/U in 4.5  Months, please call if you need me before  Please commit to walking for 20 to 30 minutes every day.  Cut back on calories, weight loss goal of 8 pounds.  Blood sugar is normal now, cut back to metformin ONE daily, to help with continued sugar control, and hopefully weight loss.  Microalb today  Sign for report from [facility] April visit, and Office visit from [facility] in 03/2011 for gynecologist who saw you, dr Lilla Shook  Mammogram is due early August, please schedule  HBa1C and chem 7

## 2013-05-20 NOTE — Addendum Note (Signed)
Addended by: Laroy Apple E on: 05/20/2013 05:15 PM   Modules accepted: Orders

## 2013-05-20 NOTE — Telephone Encounter (Signed)
Your patient is required to take a survey for this authorization number to be valid. Your prescription authorization number is [id number]. Patient was notified that survey was due, requested this nurse to assist with online survey. Completed.

## 2013-05-21 LAB — MICROALBUMIN / CREATININE URINE RATIO
Creatinine, Urine: 81.6 mg/dL
Microalb Creat Ratio: 6.1 mg/g (ref 0.0–30.0)

## 2013-06-01 NOTE — Assessment & Plan Note (Signed)
Controlled, no change in medication Patient advised to reduce carb and sweets, commit to regular physical activity, take meds as prescribed, test blood as directed, and attempt to lose weight, to improve blood sugar control.  

## 2013-06-01 NOTE — Assessment & Plan Note (Signed)
Stable followed by oncology closely

## 2013-06-01 NOTE — Assessment & Plan Note (Signed)
Controlled, no change in medication DASH diet and commitment to daily physical activity for a minimum of 30 minutes discussed and encouraged, as a part of hypertension management. The importance of attaining a healthy weight is also discussed.  

## 2013-06-01 NOTE — Assessment & Plan Note (Signed)
Controlled, no change in medication  

## 2013-06-01 NOTE — Assessment & Plan Note (Signed)
Unchanged. Patient re-educated about  the importance of commitment to a  minimum of 150 minutes of exercise per week. The importance of healthy food choices with portion control discussed. Encouraged to start a food diary, count calories and to consider  joining a support group. Sample diet sheets offered. Goals set by the patient for the next several months.    

## 2013-06-05 ENCOUNTER — Other Ambulatory Visit: Payer: Self-pay | Admitting: *Deleted

## 2013-06-05 ENCOUNTER — Telehealth: Payer: Self-pay | Admitting: Medical Oncology

## 2013-06-05 DIAGNOSIS — C9000 Multiple myeloma not having achieved remission: Secondary | ICD-10-CM

## 2013-06-05 MED ORDER — LENALIDOMIDE 15 MG PO CAPS: ORAL_CAPSULE | ORAL | Status: DC

## 2013-06-05 NOTE — Telephone Encounter (Signed)
THIS REFILL REQUEST FOR REVLIMID WAS GIVEN TO DR.MOHAMED'S NURSE, [name] BELL,RN. 

## 2013-06-05 NOTE — Telephone Encounter (Signed)
Faxed revlimid refill to [facility]

## 2013-06-17 ENCOUNTER — Encounter: Payer: Self-pay | Admitting: Internal Medicine

## 2013-06-17 ENCOUNTER — Ambulatory Visit (HOSPITAL_BASED_OUTPATIENT_CLINIC_OR_DEPARTMENT_OTHER): Payer: 59 | Admitting: Internal Medicine

## 2013-06-17 ENCOUNTER — Telehealth: Payer: Self-pay | Admitting: Internal Medicine

## 2013-06-17 ENCOUNTER — Other Ambulatory Visit (HOSPITAL_BASED_OUTPATIENT_CLINIC_OR_DEPARTMENT_OTHER): Payer: 59 | Admitting: Lab

## 2013-06-17 VITALS — BP 146/75 | HR 70 | Temp 97.5°F | Resp 18 | Ht 66.0 in | Wt 208.9 lb

## 2013-06-17 DIAGNOSIS — C9000 Multiple myeloma not having achieved remission: Secondary | ICD-10-CM

## 2013-06-17 LAB — COMPREHENSIVE METABOLIC PANEL (CC13)
AST: 15 U/L (ref 5–34)
Albumin: 3.3 g/dL — ABNORMAL LOW (ref 3.5–5.0)
Alkaline Phosphatase: 56 U/L (ref 40–150)
Calcium: 9.7 mg/dL (ref 8.4–10.4)
Chloride: 105 mEq/L (ref 98–107)
Potassium: 4 mEq/L (ref 3.5–5.1)
Sodium: 140 mEq/L (ref 136–145)
Total Protein: 6.9 g/dL (ref 6.4–8.3)

## 2013-06-17 LAB — CBC WITH DIFFERENTIAL/PLATELET
BASO%: 0.9 % (ref 0.0–2.0)
Basophils Absolute: 0.1 10*3/uL (ref 0.0–0.1)
EOS%: 1.7 % (ref 0.0–7.0)
HCT: 36.4 % (ref 34.8–46.6)
HGB: 12.2 g/dL (ref 11.6–15.9)
MCH: 32.8 pg (ref 25.1–34.0)
MONO#: 1.2 10*3/uL — ABNORMAL HIGH (ref 0.1–0.9)
NEUT%: 59 % (ref 38.4–76.8)
RDW: 14.8 % — ABNORMAL HIGH (ref 11.2–14.5)
WBC: 7.6 10*3/uL (ref 3.9–10.3)
lymph#: 1.7 10*3/uL (ref 0.9–3.3)

## 2013-06-17 NOTE — Progress Notes (Signed)
[facility] [phone]   Fax:([phone]  OFFICE PROGRESS NOTE  Darlene Overman, MD 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  2) the patient had evidence for disease recurrence in August of 2008 and she was treated with 6 cycles of Velcade completed on 03/30/2008 with response to the treatment but was discontinued secondary to neuropathy.  3) maintenance treatment with Revlimid 10 mg by mouth daily by the does was later increase to 25 mg by mouth daily secondary to biochemical recurrence with stabilization of her disease.   CURRENT THERAPY: Revlimid 15 mg by mouth daily for 2 weeks every 3 weeks in addition to Decadron currently at 4 mg by mouth on a weekly basis.   INTERVAL HISTORY: Darlene Howard 62 y.o. female returns to the clinic today for followup visit. The patient is tolerating her treatment with Revlimid and Decadron fairly well with no significant adverse effects. She denied having any significant weight loss or night sweats. She has no chest pain, shortness breath, cough or hemoptysis. She denied having any significant peripheral neuropathy. She has been on this treatment for at least 3 years now. The patient had repeat CBC and comprehensive metabolic panel performed earlier today and she is here for evaluation and discussion of her lab results.  MEDICAL HISTORY: Past Medical History  Diagnosis Date  . Glaucoma   . Obesity   . Hypertension   . Personal history of colon cancer     next   . Diabetes mellitus   . Hyperlipidemia   . Multiple myeloma   . Allergy   . GERD (gastroesophageal reflux disease)   . Herpes     ALLERGIES:  is allergic to codeine.  MEDICATIONS:    Current Outpatient Prescriptions  Medication Sig Dispense Refill  . alendronate (FOSAMAX) 70 MG tablet TAKE 1 TAB EACH WEEK 30 MIN PRIOR TO BREAKFAST WITH LARGE GLASS OF WATER ON AN EMPTY STOMACH. REMAIN UPRIGHT.  4 tablet  1  . amLODipine (NORVASC) 2.5 MG tablet TAKE 1 TABLET BY MOUTH ONCE A DAY.  30 tablet  4  . aspirin (ASPIRIN LOW DOSE) 81 MG EC tablet Take 81 mg by mouth daily.        . bimatoprost (LUMIGAN) 0.03 % ophthalmic drops Place 1 drop into both eyes at bedtime.        . calcium-vitamin D (OSCAL 500/200 D-3) 500-200 MG-UNIT per tablet Take 1 tablet by mouth 3 (three) times daily.        Marland Kitchen conjugated estrogens (PREMARIN) vaginal cream Place 1 g vaginally. Insert into vaginal 3 times a week        . dexamethasone (DECADRON) 4 MG tablet Take 1 tablet (4 mg total) by mouth once a week.  30 tablet  1  . dorzolamide-timolol (COSOPT) 22.3-6.8 MG/ML ophthalmic solution Place 1 drop into both eyes 2 (two) times daily.      . Lancets (ONETOUCH ULTRASOFT) lancets TEST ONCE DAILY OR AS DIRECTED.  100 each  6  . lenalidomide (REVLIMID) 15 MG capsule Take one capsule daily for 14 days.  Then 7 days off.  14 capsule  0  . loratadine (CLARITIN) 10 MG tablet TAKE ONE TABLET BY MOUTH ONCE DAILY.  30 tablet  2  . metFORMIN (GLUCOPHAGE) 1000 MG tablet Take 1,000 mg by mouth. TAKE 1 TABLET BY MOUTH DAILY.      . Multiple Vitamins-Minerals (CENTRUM SILVER PO) Take by mouth daily.        . ONE TOUCH ULTRA TEST test strip USE AS DIRECTED TO CHECK DAILY.  50 each  5  . pantoprazole (PROTONIX) 40 MG tablet TAKE ONE TABLET BY MOUTH EVERY DAY FOR ACID REFLUX.  30 tablet  5  . potassium chloride SA (K-DUR,KLOR-CON) 20 MEQ tablet TAKE 1 TABLET BY MOUTH TWICE DAILY.  60 tablet  5  . valACYclovir (VALTREX) 1000 MG tablet Take 1 tablet (1,000 mg total) by mouth daily as needed.  30 tablet  prn  . valsartan-hydrochlorothiazide (DIOVAN HCT) 320-25 MG per tablet Dose increase effective 12/11/2012  Discontinue  diovan/HCTZ 320/12.5 effective 12/11/2012  30 tablet  5  . warfarin (COUMADIN) 2 MG tablet Take 1 tablet (2 mg total) by mouth daily.  30 tablet  3   No current facility-administered medications for this visit.    SURGICAL HISTORY:  Past Surgical History  Procedure Laterality Date  . Partial hysterectomy fibroids  1980  . Bone marrow transplant      REVIEW OF SYSTEMS:  A comprehensive review of systems was negative.   PHYSICAL EXAMINATION: General appearance: alert, cooperative and no distress Head: Normocephalic, without obvious abnormality, atraumatic Neck: no adenopathy Lymph nodes: Cervical, supraclavicular, and axillary nodes normal. Resp: clear to auscultation bilaterally Cardio: regular rate and rhythm, S1, S2 normal, no murmur, click, rub or gallop GI: soft, non-tender; bowel sounds normal; no masses,  no organomegaly Extremities: extremities normal, atraumatic, no cyanosis or edema  ECOG PERFORMANCE STATUS: 1 - Symptomatic but completely ambulatory  Blood pressure 146/75, pulse 70, temperature 97.5 F (36.4 C), temperature source Oral, resp. rate 18, height 5\' 6"  (1.676 m), weight 208 lb 14.4 oz (94.756 kg).  LABORATORY DATA: Lab Results  Component Value Date   WBC 7.6 06/17/2013   HGB 12.2 06/17/2013   HCT 36.4 06/17/2013   MCV 98.1 06/17/2013   PLT 134* 06/17/2013      Chemistry      Component Value Date/Time   NA 141 05/19/2013 1440   NA 139 05/14/2013 1215   K 3.7 05/19/2013 1440   K 3.9 05/14/2013 1215   CL 104 05/19/2013 1440   CL 101 05/14/2013 1215   CO2 27 05/19/2013 1440   CO2 28 05/14/2013 1215   BUN 14.1 05/19/2013 1440   BUN 11 05/14/2013 1215   CREATININE 0.9 05/19/2013 1440   CREATININE 0.86 05/14/2013 1215   CREATININE 0.78 08/22/2012 1445      Component Value Date/Time   CALCIUM 9.3 05/19/2013 1440   CALCIUM 9.9 05/14/2013 1215   ALKPHOS 52 05/19/2013 1440   ALKPHOS 52 08/22/2012 1445   AST 19 05/19/2013 1440   AST 16 08/22/2012 1445   ALT 36 05/19/2013  1440   ALT 30 08/22/2012 1445   BILITOT 1.34* 05/19/2013 1440   BILITOT 0.8 08/22/2012 1445       RADIOGRAPHIC STUDIES: No results found.  ASSESSMENT AND PLAN: This is a very pleasant 62 years old [place] American female with history of multiple myeloma currently on treatment with Revlimid 15 mg by mouth daily in addition to low-dose Decadron. The patient is tolerating her treatment fairly well with no significant adverse effects. I recommended for her to continue her current treatment as scheduled. She would have a repeat blood  work every 3 weeks. I would see the patient back for followup visit in 6 weeks with repeat myeloma panel. She was advised to call immediately if she has any concerning symptoms in the interval.  All questions were answered. The patient knows to call the clinic with any problems, questions or concerns. We can certainly see the patient much sooner if necessary.

## 2013-06-17 NOTE — Patient Instructions (Signed)
Continue treatment with Revlimid and Decadron. Followup visit in 6 weeks with repeat myeloma panel.

## 2013-06-17 NOTE — Telephone Encounter (Signed)
gv and printed appt sched and avs for pt  °

## 2013-06-25 ENCOUNTER — Other Ambulatory Visit: Payer: Self-pay | Admitting: Family Medicine

## 2013-06-26 ENCOUNTER — Other Ambulatory Visit: Payer: Self-pay | Admitting: *Deleted

## 2013-06-26 NOTE — Telephone Encounter (Signed)
Revlimid refill request to MD desk for approval 

## 2013-06-30 ENCOUNTER — Other Ambulatory Visit: Payer: Self-pay | Admitting: *Deleted

## 2013-06-30 DIAGNOSIS — C9000 Multiple myeloma not having achieved remission: Secondary | ICD-10-CM

## 2013-06-30 MED ORDER — LENALIDOMIDE 15 MG PO CAPS: ORAL_CAPSULE | ORAL | Status: DC

## 2013-07-01 ENCOUNTER — Other Ambulatory Visit: Payer: Self-pay

## 2013-07-01 ENCOUNTER — Other Ambulatory Visit: Payer: Self-pay | Admitting: Family Medicine

## 2013-07-01 DIAGNOSIS — Z1231 Encounter for screening mammogram for malignant neoplasm of breast: Secondary | ICD-10-CM

## 2013-07-08 ENCOUNTER — Other Ambulatory Visit (HOSPITAL_BASED_OUTPATIENT_CLINIC_OR_DEPARTMENT_OTHER): Payer: 59 | Admitting: Lab

## 2013-07-08 DIAGNOSIS — C9000 Multiple myeloma not having achieved remission: Secondary | ICD-10-CM

## 2013-07-08 LAB — CBC WITH DIFFERENTIAL/PLATELET
BASO%: 0.7 % (ref 0.0–2.0)
Eosinophils Absolute: 0.1 10*3/uL (ref 0.0–0.5)
MCHC: 33.4 g/dL (ref 31.5–36.0)
MONO#: 1.2 10*3/uL — ABNORMAL HIGH (ref 0.1–0.9)
NEUT#: 4.9 10*3/uL (ref 1.5–6.5)
RBC: 3.6 10*6/uL — ABNORMAL LOW (ref 3.70–5.45)
WBC: 7.8 10*3/uL (ref 3.9–10.3)
lymph#: 1.5 10*3/uL (ref 0.9–3.3)

## 2013-07-08 LAB — COMPREHENSIVE METABOLIC PANEL (CC13)
ALT: 29 U/L (ref 0–55)
Albumin: 3.4 g/dL — ABNORMAL LOW (ref 3.5–5.0)
CO2: 28 mEq/L (ref 22–29)
Calcium: 9.6 mg/dL (ref 8.4–10.4)
Chloride: 105 mEq/L (ref 98–109)
Potassium: 3.5 mEq/L (ref 3.5–5.1)
Sodium: 139 mEq/L (ref 136–145)
Total Protein: 6.8 g/dL (ref 6.4–8.3)

## 2013-07-13 ENCOUNTER — Other Ambulatory Visit: Payer: Self-pay | Admitting: Family Medicine

## 2013-07-20 ENCOUNTER — Other Ambulatory Visit: Payer: Self-pay | Admitting: *Deleted

## 2013-07-20 ENCOUNTER — Other Ambulatory Visit: Payer: Self-pay | Admitting: Medical Oncology

## 2013-07-20 DIAGNOSIS — C9000 Multiple myeloma not having achieved remission: Secondary | ICD-10-CM

## 2013-07-20 NOTE — Telephone Encounter (Signed)
THIS REFILL REQUEST FOR REVLIMID WAS GIVEN TO DR.MOHAMED'S NURSE, [name] BELL,RN. 

## 2013-07-21 MED ORDER — LENALIDOMIDE 15 MG PO CAPS: ORAL_CAPSULE | ORAL | Status: DC

## 2013-07-21 NOTE — Telephone Encounter (Signed)
revlimid refill faxed 

## 2013-07-29 ENCOUNTER — Telehealth: Payer: Self-pay | Admitting: Internal Medicine

## 2013-07-29 ENCOUNTER — Other Ambulatory Visit (HOSPITAL_BASED_OUTPATIENT_CLINIC_OR_DEPARTMENT_OTHER): Payer: 59 | Admitting: Lab

## 2013-07-29 ENCOUNTER — Encounter: Payer: Self-pay | Admitting: Internal Medicine

## 2013-07-29 ENCOUNTER — Ambulatory Visit (HOSPITAL_BASED_OUTPATIENT_CLINIC_OR_DEPARTMENT_OTHER): Payer: 59 | Admitting: Internal Medicine

## 2013-07-29 VITALS — BP 144/61 | HR 72 | Temp 97.6°F | Resp 20 | Ht 66.0 in | Wt 213.2 lb

## 2013-07-29 DIAGNOSIS — C9000 Multiple myeloma not having achieved remission: Secondary | ICD-10-CM

## 2013-07-29 LAB — COMPREHENSIVE METABOLIC PANEL (CC13)
ALT: 24 U/L (ref 0–55)
CO2: 26 mEq/L (ref 22–29)
Potassium: 3.9 mEq/L (ref 3.5–5.1)
Sodium: 140 mEq/L (ref 136–145)
Total Bilirubin: 0.85 mg/dL (ref 0.20–1.20)
Total Protein: 6.8 g/dL (ref 6.4–8.3)

## 2013-07-29 LAB — CBC WITH DIFFERENTIAL/PLATELET
BASO%: 1 % (ref 0.0–2.0)
LYMPH%: 24.9 % (ref 14.0–49.7)
MCHC: 33.2 g/dL (ref 31.5–36.0)
MONO#: 1 10*3/uL — ABNORMAL HIGH (ref 0.1–0.9)
Platelets: 129 10*3/uL — ABNORMAL LOW (ref 145–400)
RBC: 3.55 10*6/uL — ABNORMAL LOW (ref 3.70–5.45)
RDW: 15.4 % — ABNORMAL HIGH (ref 11.2–14.5)
WBC: 6.1 10*3/uL (ref 3.9–10.3)
lymph#: 1.5 10*3/uL (ref 0.9–3.3)

## 2013-07-29 LAB — LACTATE DEHYDROGENASE (CC13): LDH: 136 U/L (ref 125–245)

## 2013-07-29 NOTE — Patient Instructions (Signed)
Continue treatment with Revlimid and Decadron.  Followup visit in one month

## 2013-07-29 NOTE — Telephone Encounter (Signed)
gv adn printed appt sched and avs for pt °

## 2013-07-29 NOTE — Progress Notes (Signed)
[facility] [phone]   [phone]  OFFICE PROGRESS NOTE  Darlene Overman, MD [address] [place] [address]  DIAGNOSIS: Stage IIA IgA kappa multiple myeloma diagnosed in 2006   PRIOR THERAPY:  1) status post treatment with thalidomide and Decadron followed by autologous peripheral blood stem cell transplant with high-dose melphalan on 04/26/2006 at [facility].  2) the patient had evidence for disease recurrence in August of 2008 and she was treated with 6 cycles of Velcade completed on 03/30/2008 with response to the treatment but was discontinued secondary to neuropathy.  3) maintenance treatment with Revlimid 10 mg by mouth daily by the does was later increase to 25 mg by mouth daily secondary to biochemical recurrence with stabilization of her disease.   CURRENT THERAPY: Revlimid 15 mg by mouth daily for 2 weeks every 3 weeks in addition to Decadron currently at 4 mg by mouth on a weekly basis.   INTERVAL HISTORY: Darlene Howard 62 y.o. female returns to the clinic today for followup visit. The patient is feeling fine today with no specific complaints. She denied having any significant fatigue or weakness but she continues to have mild arthritis. The patient denied having any significant weight loss or night sweats. She has no chest pain, shortness of breath, cough or hemoptysis. She had repeat CBC, comprehensive metabolic panel and myeloma panel performed recently and she is here for evaluation and discussion of her lab results.  MEDICAL HISTORY: Past Medical History  Diagnosis Date  . Glaucoma   . Obesity   . Hypertension   . Personal history of colon cancer     next   . Diabetes mellitus   . Hyperlipidemia   . Multiple myeloma   . Allergy   . GERD (gastroesophageal reflux disease)   . Herpes     ALLERGIES:  is allergic to codeine.  MEDICATIONS:  Current Outpatient Prescriptions  Medication Sig Dispense  Refill  . alendronate (FOSAMAX) 70 MG tablet TAKE 1 TAB EACH WEEK 30 MIN PRIOR TO BREAKFAST WITH LARGE GLASS OF WATER ON AN EMPTY STOMACH. REMAIN UPRIGHT.  4 tablet  3  . amLODipine (NORVASC) 2.5 MG tablet TAKE 1 TABLET BY MOUTH ONCE A DAY.  30 tablet  4  . aspirin (ASPIRIN LOW DOSE) 81 MG EC tablet Take 81 mg by mouth daily.        . bimatoprost (LUMIGAN) 0.03 % ophthalmic drops Place 1 drop into both eyes at bedtime.        . calcium-vitamin D (OSCAL 500/200 D-3) 500-200 MG-UNIT per tablet Take 1 tablet by mouth 3 (three) times daily.        Marland Kitchen conjugated estrogens (PREMARIN) vaginal cream Place 1 g vaginally. Insert into vaginal 3 times a week        . dexamethasone (DECADRON) 4 MG tablet Take 1 tablet (4 mg total) by mouth once a week.  30 tablet  1  . dorzolamide-timolol (COSOPT) 22.3-6.8 MG/ML ophthalmic solution Place 1 drop into both eyes 2 (two) times daily.      . Lancets (ONETOUCH ULTRASOFT) lancets TEST ONCE DAILY OR AS DIRECTED.  100 each  5  . lenalidomide (REVLIMID) 15 MG capsule Take one capsule daily for 14 days.  Then 7 days off.  14 capsule  0  . loratadine (CLARITIN) 10 MG tablet TAKE ONE TABLET BY MOUTH ONCE DAILY.  30 tablet  5  . metFORMIN (GLUCOPHAGE) 1000 MG  tablet Take 1,000 mg by mouth. TAKE 1 TABLET BY MOUTH DAILY.      . Multiple Vitamins-Minerals (CENTRUM SILVER PO) Take by mouth daily.        . ONE TOUCH ULTRA TEST test strip USE AS DIRECTED TO CHECK DAILY.  50 each  5  . pantoprazole (PROTONIX) 40 MG tablet TAKE ONE TABLET BY MOUTH EVERY DAY FOR ACID REFLUX.  30 tablet  5  . potassium chloride SA (K-DUR,KLOR-CON) 20 MEQ tablet TAKE 1 TABLET BY MOUTH TWICE DAILY.  60 tablet  5  . valACYclovir (VALTREX) 1000 MG tablet Take 1 tablet (1,000 mg total) by mouth daily as needed.  30 tablet  prn  . valsartan-hydrochlorothiazide (DIOVAN HCT) 320-25 MG per tablet Dose increase effective 12/11/2012  Discontinue diovan/HCTZ 320/12.5 effective 12/11/2012  30 tablet  5  .  warfarin (COUMADIN) 2 MG tablet Take 1 tablet (2 mg total) by mouth daily.  30 tablet  3   No current facility-administered medications for this visit.    SURGICAL HISTORY:  Past Surgical History  Procedure Laterality Date  . Partial hysterectomy fibroids  1980  . Bone marrow transplant      REVIEW OF SYSTEMS:  A comprehensive review of systems was negative except for: Constitutional: positive for fatigue Musculoskeletal: positive for arthralgias   PHYSICAL EXAMINATION: General appearance: alert, cooperative, fatigued and no distress Head: Normocephalic, without obvious abnormality, atraumatic Neck: no adenopathy Lymph nodes: Cervical, supraclavicular, and axillary nodes normal. Resp: clear to auscultation bilaterally Cardio: regular rate and rhythm, S1, S2 normal, no murmur, click, rub or gallop GI: soft, non-tender; bowel sounds normal; no masses,  no organomegaly Extremities: extremities normal, atraumatic, no cyanosis or edema  ECOG PERFORMANCE STATUS: 1 - Symptomatic but completely ambulatory  Blood pressure 144/61, pulse 72, temperature 97.6 F (36.4 C), temperature source Oral, resp. rate 20, height 5\' 6"  (1.676 m), weight 213 lb 3.2 oz (96.707 kg).  LABORATORY DATA: Lab Results  Component Value Date   WBC 6.1 07/29/2013   HGB 11.5* 07/29/2013   HCT 34.7* 07/29/2013   MCV 97.9 07/29/2013   PLT 129* 07/29/2013      Chemistry      Component Value Date/Time   NA 139 07/08/2013 1459   NA 139 05/14/2013 1215   K 3.5 07/08/2013 1459   K 3.9 05/14/2013 1215   CL 105 06/17/2013 1441   CL 101 05/14/2013 1215   CO2 28 07/08/2013 1459   CO2 28 05/14/2013 1215   BUN 16.6 07/08/2013 1459   BUN 11 05/14/2013 1215   CREATININE 0.8 07/08/2013 1459   CREATININE 0.86 05/14/2013 1215   CREATININE 0.78 08/22/2012 1445      Component Value Date/Time   CALCIUM 9.6 07/08/2013 1459   CALCIUM 9.9 05/14/2013 1215   ALKPHOS 51 07/08/2013 1459   ALKPHOS 52 08/22/2012 1445   AST 19 07/08/2013 1459   AST 16  08/22/2012 1445   ALT 29 07/08/2013 1459   ALT 30 08/22/2012 1445   BILITOT 1.08 07/08/2013 1459   BILITOT 0.8 08/22/2012 1445       RADIOGRAPHIC STUDIES: No results found.  ASSESSMENT AND PLAN: This is a very pleasant 62 years old [place] American female with multiple myeloma currently on treatment with Revlimid and low-dose Decadron and tolerating it fairly well. The patient had myeloma panel performed earlier today but the results still pending. I recommended for the patient to continue her current treatment with Revlimid and Decadron. I would see her back for  followup visit in one month's unless her myeloma panel showed any evidence for disease progression, I will contact the patient with you change in her treatment plan. She was advised to call immediately if she has any concerning symptoms in the interval.  The patient voices understanding of current disease status and treatment options and is in agreement with the current care plan.  All questions were answered. The patient knows to call the clinic with any problems, questions or concerns. We can certainly see the patient much sooner if necessary.

## 2013-07-29 NOTE — Telephone Encounter (Signed)
gv and printed appt sched and avs for pt  °

## 2013-07-30 LAB — IGG, IGA, IGM
IgA: 1170 mg/dL — ABNORMAL HIGH (ref 69–380)
IgG (Immunoglobin G), Serum: 417 mg/dL — ABNORMAL LOW (ref 690–1700)

## 2013-07-30 LAB — KAPPA/LAMBDA LIGHT CHAINS: Kappa:Lambda Ratio: 1.47 (ref 0.26–1.65)

## 2013-08-03 ENCOUNTER — Ambulatory Visit
Admission: RE | Admit: 2013-08-03 | Discharge: 2013-08-03 | Disposition: A | Discharge status: Home / Self Care | Payer: 59 | Source: Ambulatory Visit

## 2013-08-03 DIAGNOSIS — Z1231 Encounter for screening mammogram for malignant neoplasm of breast: Secondary | ICD-10-CM

## 2013-08-11 ENCOUNTER — Telehealth: Payer: Self-pay | Admitting: Internal Medicine

## 2013-08-11 ENCOUNTER — Other Ambulatory Visit: Payer: Self-pay | Admitting: *Deleted

## 2013-08-11 ENCOUNTER — Other Ambulatory Visit (HOSPITAL_BASED_OUTPATIENT_CLINIC_OR_DEPARTMENT_OTHER): Payer: 59 | Admitting: Lab

## 2013-08-11 ENCOUNTER — Ambulatory Visit (HOSPITAL_BASED_OUTPATIENT_CLINIC_OR_DEPARTMENT_OTHER): Payer: 59 | Admitting: Physician Assistant

## 2013-08-11 VITALS — BP 123/50 | HR 70 | Temp 98.1°F | Resp 18 | Ht 66.0 in | Wt 210.7 lb

## 2013-08-11 DIAGNOSIS — C9000 Multiple myeloma not having achieved remission: Secondary | ICD-10-CM

## 2013-08-11 LAB — COMPREHENSIVE METABOLIC PANEL (CC13)
AST: 19 U/L (ref 5–34)
Alkaline Phosphatase: 52 U/L (ref 40–150)
Glucose: 128 mg/dl (ref 70–140)
Sodium: 142 mEq/L (ref 136–145)
Total Bilirubin: 1.14 mg/dL (ref 0.20–1.20)
Total Protein: 7.2 g/dL (ref 6.4–8.3)

## 2013-08-11 LAB — CBC WITH DIFFERENTIAL/PLATELET
BASO%: 0.6 % (ref 0.0–2.0)
EOS%: 1.6 % (ref 0.0–7.0)
Eosinophils Absolute: 0.2 10*3/uL (ref 0.0–0.5)
LYMPH%: 14.8 % (ref 14.0–49.7)
MCH: 32.3 pg (ref 25.1–34.0)
MCHC: 32.8 g/dL (ref 31.5–36.0)
MCV: 98.2 fL (ref 79.5–101.0)
MONO%: 16.4 % — ABNORMAL HIGH (ref 0.0–14.0)
Platelets: 165 10*3/uL (ref 145–400)
RBC: 3.66 10*6/uL — ABNORMAL LOW (ref 3.70–5.45)

## 2013-08-11 LAB — LACTATE DEHYDROGENASE: LDH: 134 U/L (ref 94–250)

## 2013-08-11 NOTE — Telephone Encounter (Signed)
THIS REFILL REQUEST FOR REVLIMID WAS GIVEN TO DR.MOHAMED'S NURSE [name] BELL,RN.

## 2013-08-11 NOTE — Telephone Encounter (Signed)
gv and printed appt sched and avs for pt  °

## 2013-08-13 MED ORDER — LENALIDOMIDE 15 MG PO CAPS: ORAL_CAPSULE | ORAL | Status: DC

## 2013-08-13 NOTE — Progress Notes (Addendum)
[facility] [phone]   Fax:([phone]  OFFICE PROGRESS NOTE  Darlene Overman, MD [address] [place] [address]  DIAGNOSIS: Stage IIA IgA kappa multiple myeloma diagnosed in 2006   PRIOR THERAPY:  1) status post treatment with thalidomide and Decadron followed by autologous peripheral blood stem cell transplant with high-dose melphalan on 04/26/2006 at [facility].  2) the patient had evidence for disease recurrence in August of 2008 and she was treated with 6 cycles of Velcade completed on 03/30/2008 with response to the treatment but was discontinued secondary to neuropathy.  3) maintenance treatment with Revlimid 10 mg by mouth daily by the does was later increase to 25 mg by mouth daily secondary to biochemical recurrence with stabilization of her disease.   CURRENT THERAPY: Revlimid 15 mg by mouth daily for 2 weeks every 3 weeks in addition to Decadron currently at 4 mg by mouth on a weekly basis.   INTERVAL HISTORY: Darlene Howard 62 y.o. female returns to the clinic today for followup visit. The patient is feeling fine today with no specific complaints. She denied having any significant fatigue or weakness but she continues to have mild arthritis. The patient denied having any significant weight loss or night sweats. She has no chest pain, shortness of breath, cough or hemoptysis. She requests a refill for her Revlimid.  MEDICAL HISTORY: Past Medical History  Diagnosis Date  . Glaucoma   . Obesity   . Hypertension   . Personal history of colon cancer     next   . Diabetes mellitus   . Hyperlipidemia   . Multiple myeloma   . Allergy   . GERD (gastroesophageal reflux disease)   . Herpes     ALLERGIES:  is allergic to codeine.  MEDICATIONS:  Current Outpatient Prescriptions  Medication Sig Dispense Refill  . alendronate (FOSAMAX) 70 MG tablet TAKE 1 TAB EACH WEEK 30 MIN PRIOR TO BREAKFAST WITH LARGE GLASS OF  WATER ON AN EMPTY STOMACH. REMAIN UPRIGHT.  4 tablet  3  . amLODipine (NORVASC) 2.5 MG tablet TAKE 1 TABLET BY MOUTH ONCE A DAY.  30 tablet  4  . aspirin (ASPIRIN LOW DOSE) 81 MG EC tablet Take 81 mg by mouth daily.        . bimatoprost (LUMIGAN) 0.03 % ophthalmic drops Place 1 drop into both eyes at bedtime.        . calcium-vitamin D (OSCAL 500/200 D-3) 500-200 MG-UNIT per tablet Take 1 tablet by mouth 3 (three) times daily.        Marland Kitchen conjugated estrogens (PREMARIN) vaginal cream Place 1 g vaginally. Insert into vaginal 3 times a week        . dexamethasone (DECADRON) 4 MG tablet Take 1 tablet (4 mg total) by mouth once a week.  30 tablet  1  . dorzolamide-timolol (COSOPT) 22.3-6.8 MG/ML ophthalmic solution Place 1 drop into both eyes 2 (two) times daily.      . Lancets (ONETOUCH ULTRASOFT) lancets TEST ONCE DAILY OR AS DIRECTED.  100 each  5  . lenalidomide (REVLIMID) 15 MG capsule Take one capsule daily for 14 days.  Then 7 days off.  14 capsule  0  . loratadine (CLARITIN) 10 MG tablet TAKE ONE TABLET BY MOUTH ONCE DAILY.  30 tablet  5  . metFORMIN (GLUCOPHAGE) 1000 MG tablet Take 1,000 mg by mouth. TAKE 1 TABLET BY MOUTH DAILY.      Marland Kitchen  Multiple Vitamins-Minerals (CENTRUM SILVER PO) Take by mouth daily.        . ONE TOUCH ULTRA TEST test strip USE AS DIRECTED TO CHECK DAILY.  50 each  5  . pantoprazole (PROTONIX) 40 MG tablet TAKE ONE TABLET BY MOUTH EVERY DAY FOR ACID REFLUX.  30 tablet  5  . potassium chloride SA (K-DUR,KLOR-CON) 20 MEQ tablet TAKE 1 TABLET BY MOUTH TWICE DAILY.  60 tablet  5  . valACYclovir (VALTREX) 1000 MG tablet Take 1 tablet (1,000 mg total) by mouth daily as needed.  30 tablet  prn  . valsartan-hydrochlorothiazide (DIOVAN HCT) 320-25 MG per tablet Dose increase effective 12/11/2012  Discontinue diovan/HCTZ 320/12.5 effective 12/11/2012  30 tablet  5  . warfarin (COUMADIN) 2 MG tablet Take 1 tablet (2 mg total) by mouth daily.  30 tablet  3   No current  facility-administered medications for this visit.    SURGICAL HISTORY:  Past Surgical History  Procedure Laterality Date  . Partial hysterectomy fibroids  1980  . Bone marrow transplant      REVIEW OF SYSTEMS:  A comprehensive review of systems was negative except for: Constitutional: positive for fatigue Musculoskeletal: positive for arthralgias   PHYSICAL EXAMINATION: General appearance: alert, cooperative, fatigued and no distress Head: Normocephalic, without obvious abnormality, atraumatic Neck: no adenopathy Lymph nodes: Cervical, supraclavicular, and axillary nodes normal. Resp: clear to auscultation bilaterally Cardio: regular rate and rhythm, S1, S2 normal, no murmur, click, rub or gallop GI: soft, non-tender; bowel sounds normal; no masses,  no organomegaly Extremities: extremities normal, atraumatic, no cyanosis or edema  ECOG PERFORMANCE STATUS: 1 - Symptomatic but completely ambulatory  Blood pressure 123/50, pulse 70, temperature 98.1 F (36.7 C), temperature source Oral, resp. rate 18, height 5\' 6"  (1.676 m), weight 210 lb 11.2 oz (95.573 kg).  LABORATORY DATA: Lab Results  Component Value Date   WBC 9.4 08/11/2013   HGB 11.8 08/11/2013   HCT 36.0 08/11/2013   MCV 98.2 08/11/2013   PLT 165 08/11/2013      Chemistry      Component Value Date/Time   NA 142 08/11/2013 1425   NA 139 05/14/2013 1215   K 3.8 08/11/2013 1425   K 3.9 05/14/2013 1215   CL 105 06/17/2013 1441   CL 101 05/14/2013 1215   CO2 27 08/11/2013 1425   CO2 28 05/14/2013 1215   BUN 13.3 08/11/2013 1425   BUN 11 05/14/2013 1215   CREATININE 0.9 08/11/2013 1425   CREATININE 0.86 05/14/2013 1215   CREATININE 0.78 08/22/2012 1445      Component Value Date/Time   CALCIUM 9.8 08/11/2013 1425   CALCIUM 9.9 05/14/2013 1215   ALKPHOS 52 08/11/2013 1425   ALKPHOS 52 08/22/2012 1445   AST 19 08/11/2013 1425   AST 16 08/22/2012 1445   ALT 30 08/11/2013 1425   ALT 30 08/22/2012 1445   BILITOT 1.14 08/11/2013 1425    BILITOT 0.8 08/22/2012 1445       RADIOGRAPHIC STUDIES: No results found.  ASSESSMENT AND PLAN: This is a very pleasant 62 years old [place] American female with multiple myeloma currently on treatment with Revlimid and low-dose Decadron and tolerating it fairly well. Patient was discussed with and also seen by Dr. Arbutus Ped. She'll continue on her Revlimid and Decadron at the current doses. We will Institute a refill for her Revlimid on our end. She'll followup in one month for another symptom management visit.   Conni Slipper, PA-C   She  was advised to call immediately if she has any concerning symptoms in the interval.  The patient voices understanding of current disease status and treatment options and is in agreement with the current care plan.  All questions were answered. The patient knows to call the clinic with any problems, questions or concerns. We can certainly see the patient much sooner if necessary.  ADDENDUM: Hematology/Oncology Attending: I have a face to face encounter with the patient today. I recommended her care plan. The patient has history of multiple myeloma and currently on treatment with Revlimid and low-dose Decadron. She is tolerating her treatment fairly well with no significant adverse effects. Her blood work today is unremarkable. I recommended for the patient to continue her current treatment with Revlimid and Decadron. She would come back for follow up visit in one month's for reevaluation and management any adverse effect of her treatment. Lajuana Matte., MD 08/13/2013

## 2013-08-13 NOTE — Patient Instructions (Addendum)
Continue taking your Revlimid and Decadron at the current doses. Ffollowup in one month for another symptom management visit.

## 2013-08-13 NOTE — Addendum Note (Signed)
Addended by: Arvilla Meres on: 08/13/2013 02:59 PM   Modules accepted: Orders

## 2013-08-19 ENCOUNTER — Other Ambulatory Visit: Payer: 59

## 2013-08-25 ENCOUNTER — Encounter: Payer: Self-pay | Admitting: Family Medicine

## 2013-08-25 ENCOUNTER — Other Ambulatory Visit: Payer: Self-pay | Admitting: Family Medicine

## 2013-08-25 ENCOUNTER — Ambulatory Visit (INDEPENDENT_AMBULATORY_CARE_PROVIDER_SITE_OTHER): Payer: 59 | Admitting: Family Medicine

## 2013-08-25 VITALS — BP 114/62 | HR 77 | Resp 18 | Ht 66.0 in | Wt 213.1 lb

## 2013-08-25 DIAGNOSIS — E119 Type 2 diabetes mellitus without complications: Secondary | ICD-10-CM

## 2013-08-25 DIAGNOSIS — I10 Essential (primary) hypertension: Secondary | ICD-10-CM

## 2013-08-25 DIAGNOSIS — J209 Acute bronchitis, unspecified: Secondary | ICD-10-CM

## 2013-08-25 MED ORDER — PENICILLIN V POTASSIUM 500 MG PO TABS: 500.0000 mg | ORAL_TABLET | Freq: Three times a day (TID) | ORAL | Status: DC

## 2013-08-25 MED ORDER — BENZONATATE 100 MG PO CAPS: 100.0000 mg | ORAL_CAPSULE | Freq: Four times a day (QID) | ORAL | Status: DC | PRN

## 2013-08-25 MED ORDER — PROMETHAZINE-DM 6.25-15 MG/5ML PO SYRP: ORAL_SOLUTION | ORAL | Status: AC

## 2013-08-25 NOTE — Assessment & Plan Note (Signed)
Controlled, no change in medication  

## 2013-08-25 NOTE — Progress Notes (Signed)
  Subjective:    Patient ID: Darlene Howard, female    DOB: 1951/10/17, 62 y.o.   MRN: [id number]  HPI 3 day h/o chest congestion with sputum and post nasal drainage, started with a tickle in the back of the throat, no fever or chills , concerned due to being immunosuppressed. Denies sinus pressure, ear pain or sore throat. Otherwise has been well   Review of Systems See HPI  Denies chest pains, palpitations and leg swelling Denies abdominal pain, nausea, vomiting,diarrhea or constipation.   Denies dysuria, frequency, hesitancy or incontinence. Denies joint pain, swelling and limitation in mobility. Denies headaches, seizures, numbness, or tingling. Denies depression, anxiety or insomnia. Denies skin break down or rash.        Objective:   Physical Exam  Patient alert and oriented and in no cardiopulmonary distress.  HEENT: No facial asymmetry, EOMI, no sinus tenderness,  oropharynx pink and moist.  Neck supple no adenopathy.TM clear bilaterally  Chest: Good air entry throughout , few crackles, no wheezes.  CVS: S1, S2 no murmurs, no S3.  ABD: Soft non tender. Bowel sounds normal.  Ext: No edema  MS: Adequate ROM spine, shoulders, hips and knees.  Skin: Intact, no ulcerations or rash noted.  Psych: Good eye contact, normal affect. Memory intact not anxious or depressed appearing.  CNS: CN 2-12 intact, power, tone and sensation normal throughout.       Assessment & Plan:

## 2013-08-25 NOTE — Assessment & Plan Note (Signed)
Acute onset of chest congestion and cough in immunosuppressed pt, antibiotic course, decongestant and cough suppressant prescribed

## 2013-08-25 NOTE — Patient Instructions (Addendum)
F/u as before   You are being treated for acute bronchitis, antibiotic course for 10 days as well as with decongestants and a cough suppressant at bedtime as needed for excessive cough  Keep appointment as before

## 2013-09-01 ENCOUNTER — Other Ambulatory Visit: Payer: Self-pay | Admitting: *Deleted

## 2013-09-01 NOTE — Telephone Encounter (Signed)
THIS REFILL REQUEST FOR REVLIMID WAS PLACED ON DR.MOHAMED'S NURSE'S DESK. 

## 2013-09-03 ENCOUNTER — Other Ambulatory Visit: Payer: Self-pay | Admitting: Medical Oncology

## 2013-09-03 DIAGNOSIS — C9000 Multiple myeloma not having achieved remission: Secondary | ICD-10-CM

## 2013-09-03 MED ORDER — LENALIDOMIDE 15 MG PO CAPS: ORAL_CAPSULE | ORAL | Status: DC

## 2013-09-03 NOTE — Telephone Encounter (Signed)
Faxed refill to [facility]

## 2013-09-08 ENCOUNTER — Telehealth: Payer: Self-pay | Admitting: Internal Medicine

## 2013-09-08 ENCOUNTER — Other Ambulatory Visit (HOSPITAL_BASED_OUTPATIENT_CLINIC_OR_DEPARTMENT_OTHER): Payer: 59 | Admitting: Lab

## 2013-09-08 ENCOUNTER — Ambulatory Visit (HOSPITAL_BASED_OUTPATIENT_CLINIC_OR_DEPARTMENT_OTHER): Payer: 59 | Admitting: Internal Medicine

## 2013-09-08 ENCOUNTER — Encounter: Payer: Self-pay | Admitting: Internal Medicine

## 2013-09-08 VITALS — BP 117/66 | HR 86 | Temp 97.0°F | Resp 20 | Ht 66.0 in | Wt 208.9 lb

## 2013-09-08 DIAGNOSIS — C9002 Multiple myeloma in relapse: Secondary | ICD-10-CM

## 2013-09-08 DIAGNOSIS — C9000 Multiple myeloma not having achieved remission: Secondary | ICD-10-CM

## 2013-09-08 LAB — COMPREHENSIVE METABOLIC PANEL (CC13)
AST: 25 U/L (ref 5–34)
Albumin: 3.2 g/dL — ABNORMAL LOW (ref 3.5–5.0)
Alkaline Phosphatase: 58 U/L (ref 40–150)
BUN: 11 mg/dL (ref 7.0–26.0)
Calcium: 9.7 mg/dL (ref 8.4–10.4)
Chloride: 108 mEq/L (ref 98–109)
Potassium: 4.3 mEq/L (ref 3.5–5.1)
Sodium: 143 mEq/L (ref 136–145)
Total Protein: 7.2 g/dL (ref 6.4–8.3)

## 2013-09-08 LAB — CBC WITH DIFFERENTIAL/PLATELET
Basophils Absolute: 0.1 10*3/uL (ref 0.0–0.1)
EOS%: 2.4 % (ref 0.0–7.0)
Eosinophils Absolute: 0.1 10*3/uL (ref 0.0–0.5)
HGB: 11.6 g/dL (ref 11.6–15.9)
MONO%: 18.5 % — ABNORMAL HIGH (ref 0.0–14.0)
NEUT#: 2.9 10*3/uL (ref 1.5–6.5)
RBC: 3.62 10*6/uL — ABNORMAL LOW (ref 3.70–5.45)
RDW: 15.1 % — ABNORMAL HIGH (ref 11.2–14.5)
lymph#: 1.4 10*3/uL (ref 0.9–3.3)

## 2013-09-08 NOTE — Patient Instructions (Signed)
Followup visit in one month with repeat myeloma

## 2013-09-08 NOTE — Telephone Encounter (Signed)
gv pt appt schedule for October.  °

## 2013-09-08 NOTE — Progress Notes (Signed)
[facility] [phone]   Fax:([phone]) [phone]  OFFICE PROGRESS NOTE  Darlene Overman, MD 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  2) the patient had evidence for disease recurrence in August of 2008 and she was treated with 6 cycles of Velcade completed on 03/30/2008 with response to the treatment but was discontinued secondary to neuropathy.  3) maintenance treatment with Revlimid 10 mg by mouth daily by the does was later increase to 25 mg by mouth daily secondary to biochemical recurrence with stabilization of her disease.   CURRENT THERAPY: Revlimid 15 mg by mouth daily for 2 weeks every 3 weeks in addition to Decadron currently at 4 mg by mouth on a weekly basis.   INTERVAL HISTORY: Darlene Howard 62 y.o. female returns to the clinic today for routine monthly followup visit. The patient is feeling fine today with no specific complaints except for mild chest congestion and bronchitis. She was seen recently by her primary care physician and was started on treatment with antibiotics as well as Tessalon. She is feeling a little bit better today. She is tolerating her treatment with maintenance Revlimid fairly well with no significant adverse effects. The patient denied having any fever or chills. She denied having any chest pain, shortness of breath, cough or hemoptysis. She has no weight loss or night sweats.   MEDICAL HISTORY: Past Medical History  Diagnosis Date  . Glaucoma   . Obesity   . Hypertension   . Personal history of colon cancer     next   . Diabetes mellitus   . Hyperlipidemia   . Multiple myeloma   . Allergy   . GERD (gastroesophageal reflux disease)   . Herpes      ALLERGIES:  is allergic to codeine.  MEDICATIONS:  Current Outpatient Prescriptions  Medication Sig Dispense Refill  . alendronate (FOSAMAX) 70 MG tablet TAKE 1 TAB EACH WEEK 30 MIN PRIOR TO BREAKFAST WITH LARGE GLASS OF WATER ON AN EMPTY STOMACH. REMAIN UPRIGHT.  4 tablet  3  . amLODipine (NORVASC) 2.5 MG tablet TAKE 1 TABLET BY MOUTH ONCE A DAY.  30 tablet  4  . aspirin (ASPIRIN LOW DOSE) 81 MG EC tablet Take 81 mg by mouth daily.        . benzonatate (TESSALON PERLES) 100 MG capsule Take 1 capsule (100 mg total) by mouth every 6 (six) hours as needed for cough.  30 capsule  0  . bimatoprost (LUMIGAN) 0.03 % ophthalmic drops Place 1 drop into both eyes at bedtime.        . calcium-vitamin D (OSCAL 500/200 D-3) 500-200 MG-UNIT per tablet Take 1 tablet by mouth 3 (three) times daily.        Marland Kitchen conjugated estrogens (PREMARIN) vaginal cream Place 1 g vaginally. Insert into vaginal 3 times a week        . dexamethasone (DECADRON) 4 MG tablet Take 1 tablet (4 mg total) by mouth once a week.  30 tablet  1  . dorzolamide-timolol (COSOPT) 22.3-6.8 MG/ML ophthalmic solution Place 1 drop into both eyes 2 (two) times daily.      . Lancets (ONETOUCH ULTRASOFT) lancets TEST ONCE DAILY OR AS DIRECTED.  100 each  5  .  lenalidomide (REVLIMID) 15 MG capsule Take one capsule daily for 14 days.  Then 7 days off.  14 capsule  0  . loratadine (CLARITIN) 10 MG tablet TAKE ONE TABLET BY MOUTH ONCE DAILY.  30 tablet  5  . metFORMIN (GLUCOPHAGE) 1000 MG tablet Take 1,000 mg by mouth. TAKE 1 TABLET BY MOUTH DAILY.      . Multiple Vitamins-Minerals (CENTRUM SILVER PO) Take by mouth daily.        . ONE TOUCH ULTRA TEST test strip USE AS DIRECTED TO CHECK DAILY.  50 each  5  . pantoprazole (PROTONIX) 40 MG tablet TAKE ONE TABLET BY MOUTH EVERY DAY FOR ACID REFLUX.  30 tablet  5  . penicillin v potassium (VEETID) 500 MG tablet Take 1 tablet (500 mg total) by mouth 3 (three) times daily.  30 tablet  0  . potassium  chloride SA (K-DUR,KLOR-CON) 20 MEQ tablet TAKE 1 TABLET BY MOUTH TWICE DAILY.  60 tablet  5  . promethazine-dextromethorphan (PROMETHAZINE-DM) 6.25-15 MG/5ML syrup One teaspoon at bedtime as needed, for excessive cough  240 mL  0  . valACYclovir (VALTREX) 1000 MG tablet Take 1 tablet (1,000 mg total) by mouth daily as needed.  30 tablet  prn  . valsartan-hydrochlorothiazide (DIOVAN-HCT) 320-25 MG per tablet TAKE ONE TABLET BY MOUTH ONCE DAILY.  30 tablet  2  . warfarin (COUMADIN) 2 MG tablet Take 1 tablet (2 mg total) by mouth daily.  30 tablet  3   No current facility-administered medications for this visit.    SURGICAL HISTORY:  Past Surgical History  Procedure Laterality Date  . Partial hysterectomy fibroids  1980  . Bone marrow transplant      REVIEW OF SYSTEMS:  A comprehensive review of systems was negative except for: Respiratory: positive for cough   PHYSICAL EXAMINATION: General appearance: alert, cooperative and no distress Head: Normocephalic, without obvious abnormality, atraumatic Neck: no adenopathy Lymph nodes: Cervical, supraclavicular, and axillary nodes normal. Resp: clear to auscultation bilaterally Cardio: regular rate and rhythm, S1, S2 normal, no murmur, click, rub or gallop GI: soft, non-tender; bowel sounds normal; no masses,  no organomegaly Extremities: extremities normal, atraumatic, no cyanosis or edema  ECOG PERFORMANCE STATUS: 1 - Symptomatic but completely ambulatory  Blood pressure 117/66, pulse 86, temperature 97 F (36.1 C), resp. rate 20, height 5\' 6"  (1.676 m), weight 208 lb 14.4 oz (94.756 kg).  LABORATORY DATA: Lab Results  Component Value Date   WBC 5.5 09/08/2013   HGB 11.6 09/08/2013   HCT 35.2 09/08/2013   MCV 97.3 09/08/2013   PLT 149 09/08/2013      Chemistry      Component Value Date/Time   NA 142 08/11/2013 1425   NA 139 05/14/2013 1215   K 3.8 08/11/2013 1425   K 3.9 05/14/2013 1215   CL 105 06/17/2013 1441   CL 101 05/14/2013 1215    CO2 27 08/11/2013 1425   CO2 28 05/14/2013 1215   BUN 13.3 08/11/2013 1425   BUN 11 05/14/2013 1215   CREATININE 0.9 08/11/2013 1425   CREATININE 0.86 05/14/2013 1215   CREATININE 0.78 08/22/2012 1445      Component Value Date/Time   CALCIUM 9.8 08/11/2013 1425   CALCIUM 9.9 05/14/2013 1215   ALKPHOS 52 08/11/2013 1425   ALKPHOS 52 08/22/2012 1445   AST 19 08/11/2013 1425   AST 16 08/22/2012 1445   ALT 30 08/11/2013 1425   ALT 30 08/22/2012 1445   BILITOT 1.14 08/11/2013  1425   BILITOT 0.8 08/22/2012 1445       RADIOGRAPHIC STUDIES: No results found.  ASSESSMENT AND PLAN: This is a very pleasant 62 years old [place] American female with history of multiple myeloma currently on treatment with Revlimid and low-dose Decadron and tolerating her treatment fairly well. I recommended for the patient to continue her current treatment with the same regimen. I would see her back for followup visit in one month with repeat myeloma panel for evaluation of her disease. She was advised to call immediately if she has any concerning symptoms and interval.  The patient voices understanding of current disease status and treatment options and is in agreement with the current care plan.  All questions were answered. The patient knows to call the clinic with any problems, questions or concerns. We can certainly see the patient much sooner if necessary.

## 2013-09-09 ENCOUNTER — Other Ambulatory Visit: Payer: 59

## 2013-09-22 ENCOUNTER — Other Ambulatory Visit: Payer: Self-pay | Admitting: *Deleted

## 2013-09-22 NOTE — Telephone Encounter (Signed)
THIS REFILL REQUEST FOR REVLIMID WAS GIVEN TO DR.MOHAMED'S NURSE, STEPHANIE JOHNSON,RN. 

## 2013-09-23 ENCOUNTER — Telehealth: Payer: Self-pay | Admitting: Family Medicine

## 2013-09-23 ENCOUNTER — Other Ambulatory Visit: Payer: Self-pay | Admitting: *Deleted

## 2013-09-23 DIAGNOSIS — C9000 Multiple myeloma not having achieved remission: Secondary | ICD-10-CM

## 2013-09-23 MED ORDER — AMLODIPINE BESYLATE 2.5 MG PO TABS: ORAL_TABLET | ORAL | Status: DC

## 2013-09-23 MED ORDER — LENALIDOMIDE 15 MG PO CAPS: ORAL_CAPSULE | ORAL | Status: DC

## 2013-09-23 NOTE — Telephone Encounter (Signed)
Faxed to [facility]

## 2013-09-24 ENCOUNTER — Other Ambulatory Visit: Payer: Self-pay | Admitting: *Deleted

## 2013-09-24 DIAGNOSIS — C9000 Multiple myeloma not having achieved remission: Secondary | ICD-10-CM

## 2013-09-24 MED ORDER — WARFARIN SODIUM 2 MG PO TABS: 2.0000 mg | ORAL_TABLET | Freq: Every day | ORAL | Status: DC

## 2013-09-28 ENCOUNTER — Ambulatory Visit (INDEPENDENT_AMBULATORY_CARE_PROVIDER_SITE_OTHER): Payer: 59

## 2013-09-28 ENCOUNTER — Other Ambulatory Visit: Payer: Self-pay | Admitting: Family Medicine

## 2013-09-28 DIAGNOSIS — Z23 Encounter for immunization: Secondary | ICD-10-CM

## 2013-10-02 ENCOUNTER — Other Ambulatory Visit (HOSPITAL_BASED_OUTPATIENT_CLINIC_OR_DEPARTMENT_OTHER): Payer: 59 | Admitting: Lab

## 2013-10-02 DIAGNOSIS — C9000 Multiple myeloma not having achieved remission: Secondary | ICD-10-CM

## 2013-10-02 LAB — COMPREHENSIVE METABOLIC PANEL (CC13)
Albumin: 3.4 g/dL — ABNORMAL LOW (ref 3.5–5.0)
BUN: 11.6 mg/dL (ref 7.0–26.0)
Calcium: 9.8 mg/dL (ref 8.4–10.4)
Chloride: 105 mEq/L (ref 98–109)
Creatinine: 0.8 mg/dL (ref 0.6–1.1)
Glucose: 91 mg/dl (ref 70–140)
Potassium: 3.5 mEq/L (ref 3.5–5.1)

## 2013-10-02 LAB — CBC WITH DIFFERENTIAL/PLATELET
Basophils Absolute: 0 10*3/uL (ref 0.0–0.1)
Eosinophils Absolute: 0.2 10*3/uL (ref 0.0–0.5)
HCT: 35.1 % (ref 34.8–46.6)
HGB: 11.4 g/dL — ABNORMAL LOW (ref 11.6–15.9)
MCH: 31.1 pg (ref 25.1–34.0)
MCV: 95.9 fL (ref 79.5–101.0)
NEUT#: 3.6 10*3/uL (ref 1.5–6.5)
NEUT%: 65.8 % (ref 38.4–76.8)
RDW: 14.6 % — ABNORMAL HIGH (ref 11.2–14.5)
lymph#: 0.9 10*3/uL (ref 0.9–3.3)

## 2013-10-02 LAB — LACTATE DEHYDROGENASE (CC13): LDH: 146 U/L (ref 125–245)

## 2013-10-05 LAB — IGG, IGA, IGM
IgA: 1250 mg/dL — ABNORMAL HIGH (ref 69–380)
IgG (Immunoglobin G), Serum: 467 mg/dL — ABNORMAL LOW (ref 690–1700)
IgM, Serum: 16 mg/dL — ABNORMAL LOW (ref 52–322)

## 2013-10-05 LAB — KAPPA/LAMBDA LIGHT CHAINS
Kappa free light chain: 0.99 mg/dL (ref 0.33–1.94)
Kappa:Lambda Ratio: 1.68 — ABNORMAL HIGH (ref 0.26–1.65)
Lambda Free Lght Chn: 0.59 mg/dL (ref 0.57–2.63)

## 2013-10-06 ENCOUNTER — Telehealth: Payer: Self-pay | Admitting: Internal Medicine

## 2013-10-06 NOTE — Telephone Encounter (Signed)
Changed timed of 10/8 appt to 245pm due to AJ out at 315pm. S/w pt she is aware. Pt moved from MM to AJ by MM.

## 2013-10-07 ENCOUNTER — Encounter: Payer: Self-pay | Admitting: Physician Assistant

## 2013-10-07 ENCOUNTER — Telehealth: Payer: Self-pay | Admitting: Internal Medicine

## 2013-10-07 ENCOUNTER — Ambulatory Visit (HOSPITAL_BASED_OUTPATIENT_CLINIC_OR_DEPARTMENT_OTHER): Payer: 59 | Admitting: Physician Assistant

## 2013-10-07 VITALS — BP 122/72 | HR 71 | Temp 97.3°F | Resp 18 | Ht 66.0 in | Wt 212.9 lb

## 2013-10-07 DIAGNOSIS — C9000 Multiple myeloma not having achieved remission: Secondary | ICD-10-CM

## 2013-10-07 NOTE — Telephone Encounter (Signed)
gv pt appt schedule for November.

## 2013-10-08 NOTE — Progress Notes (Addendum)
[facility] [phone]   Fax:(336) [phone]  SHARED VISIT PROGRESS NOTE  [name], MD [address] [place] [address]  DIAGNOSIS: Stage IIA IgA kappa multiple myeloma diagnosed in 2006   PRIOR THERAPY:  1) status post treatment with thalidomide and Decadron followed by autologous peripheral blood stem cell transplant with high-dose melphalan on 04/26/2006 at [facility].  2) the patient had evidence for disease recurrence in August of 2008 and she was treated with 6 cycles of Velcade completed on 03/30/2008 with response to the treatment but was discontinued secondary to neuropathy.  3) maintenance treatment with Revlimid 10 mg by mouth daily by the does was later increase to 25 mg by mouth daily secondary to biochemical recurrence with stabilization of her disease.   CURRENT THERAPY: Revlimid 15 mg by mouth daily for 2 weeks every 3 weeks in addition to Decadron currently at 4 mg by mouth on a weekly basis.   INTERVAL HISTORY: Darlene Howard 62 y.o. female returns to the clinic today for routine monthly followup visit. The patient is feeling fine today with no specific complaints. She is tolerating her treatment with maintenance Revlimid fairly well with no significant adverse effects. The patient denied having any fever or chills. She denied having any chest pain, shortness of breath, cough or hemoptysis. She has no weight loss or night sweats. She had a repeat myeloma panel of drawn recently and presents to discuss the results of those labs.  MEDICAL HISTORY: Past Medical History  Diagnosis Date  . Glaucoma   . Obesity   . Hypertension   . Personal history of colon cancer     next   . Diabetes mellitus   . Hyperlipidemia   . Multiple myeloma   . Allergy   . GERD (gastroesophageal reflux disease)   . Herpes     ALLERGIES:  is allergic to codeine.  MEDICATIONS:  Current Outpatient Prescriptions  Medication Sig  Dispense Refill  . alendronate (FOSAMAX) 70 MG tablet TAKE 1 TAB EACH WEEK 30 MIN PRIOR TO BREAKFAST WITH LARGE GLASS OF WATER ON AN EMPTY STOMACH. REMAIN UPRIGHT.  4 tablet  3  . amLODipine (NORVASC) 2.5 MG tablet TAKE 1 TABLET BY MOUTH ONCE A DAY.  90 tablet  1  . aspirin (ASPIRIN LOW DOSE) 81 MG EC tablet Take 81 mg by mouth daily.        . benzonatate (TESSALON PERLES) 100 MG capsule Take 1 capsule (100 mg total) by mouth every 6 (six) hours as needed for cough.  30 capsule  0  . bimatoprost (LUMIGAN) 0.03 % ophthalmic drops Place 1 drop into both eyes at bedtime.        . calcium-vitamin D (OSCAL 500/200 D-3) 500-200 MG-UNIT per tablet Take 1 tablet by mouth 3 (three) times daily.        Marland Kitchen conjugated estrogens (PREMARIN) vaginal cream Place 1 g vaginally. Insert into vaginal 3 times a week        . dexamethasone (DECADRON) 4 MG tablet Take 1 tablet (4 mg total) by mouth once a week.  30 tablet  1  . dorzolamide-timolol (COSOPT) 22.3-6.8 MG/ML ophthalmic solution Place 1 drop into both eyes 2 (two) times daily.      . Lancets (ONETOUCH ULTRASOFT) lancets TEST ONCE DAILY OR AS DIRECTED.  100 each  5  . lenalidomide (REVLIMID) 15 MG capsule Take one capsule daily for 14 days.  Then 7  days off.  14 capsule  0  . loratadine (CLARITIN) 10 MG tablet TAKE ONE TABLET BY MOUTH ONCE DAILY.  30 tablet  5  . metFORMIN (GLUCOPHAGE) 1000 MG tablet Take 1,000 mg by mouth. TAKE 1 TABLET BY MOUTH DAILY.      . Multiple Vitamins-Minerals (CENTRUM SILVER PO) Take by mouth daily.        . ONE TOUCH ULTRA TEST test strip USE AS DIRECTED TO CHECK DAILY.  50 each  5  . pantoprazole (PROTONIX) 40 MG tablet TAKE ONE TABLET BY MOUTH EVERY DAY FOR ACID REFLUX.  30 tablet  5  . penicillin v potassium (VEETID) 500 MG tablet Take 1 tablet (500 mg total) by mouth 3 (three) times daily.  30 tablet  0  . potassium chloride SA (K-DUR,KLOR-CON) 20 MEQ tablet TAKE 1 TABLET BY MOUTH TWICE DAILY.  60 tablet  5  . valACYclovir  (VALTREX) 1000 MG tablet Take 1 tablet (1,000 mg total) by mouth daily as needed.  30 tablet  prn  . valsartan-hydrochlorothiazide (DIOVAN-HCT) 320-25 MG per tablet TAKE ONE TABLET BY MOUTH ONCE DAILY.  30 tablet  2  . warfarin (COUMADIN) 2 MG tablet Take 1 tablet (2 mg total) by mouth daily.  30 tablet  3   No current facility-administered medications for this visit.    SURGICAL HISTORY:  Past Surgical History  Procedure Laterality Date  . Partial hysterectomy fibroids  1980  . Bone marrow transplant      REVIEW OF SYSTEMS:  A comprehensive review of systems was negative except for: Respiratory: positive for cough   PHYSICAL EXAMINATION: General appearance: alert, cooperative and no distress Head: Normocephalic, without obvious abnormality, atraumatic Neck: no adenopathy Lymph nodes: Cervical, supraclavicular, and axillary nodes normal. Resp: clear to auscultation bilaterally Cardio: regular rate and rhythm, S1, S2 normal, no murmur, click, rub or gallop GI: soft, non-tender; bowel sounds normal; no masses,  no organomegaly Extremities: extremities normal, atraumatic, no cyanosis or edema  ECOG PERFORMANCE STATUS: 1 - Symptomatic but completely ambulatory  Blood pressure 122/72, pulse 71, temperature 97.3 F (36.3 C), temperature source Oral, resp. rate 18, height 5\' 6"  (1.676 m), weight 212 lb 14.4 oz (96.571 kg).  LABORATORY DATA: Lab Results  Component Value Date   WBC 5.4 10/02/2013   HGB 11.4* 10/02/2013   HCT 35.1 10/02/2013   MCV 95.9 10/02/2013   PLT 138* 10/02/2013      Chemistry      Component Value Date/Time   NA 141 10/02/2013 1448   NA 139 05/14/2013 1215   K 3.5 10/02/2013 1448   K 3.9 05/14/2013 1215   CL 105 06/17/2013 1441   CL 101 05/14/2013 1215   CO2 27 10/02/2013 1448   CO2 28 05/14/2013 1215   BUN 11.6 10/02/2013 1448   BUN 11 05/14/2013 1215   CREATININE 0.8 10/02/2013 1448   CREATININE 0.86 05/14/2013 1215   CREATININE 0.78 08/22/2012 1445       Component Value Date/Time   CALCIUM 9.8 10/02/2013 1448   CALCIUM 9.9 05/14/2013 1215   ALKPHOS 56 10/02/2013 1448   ALKPHOS 52 08/22/2012 1445   AST 21 10/02/2013 1448   AST 16 08/22/2012 1445   ALT 23 10/02/2013 1448   ALT 30 08/22/2012 1445   BILITOT 1.22* 10/02/2013 1448   BILITOT 0.8 08/22/2012 1445     Protein studies: IgG 467, IgA 1250, IgM 16, beta 2 marker clip and 1.92, kappa free light chain 0.99, lambda free light  chain 0.59,Kappa:Lambda ratio 1.68  RADIOGRAPHIC STUDIES: No results found.  ASSESSMENT AND PLAN: This is a very pleasant 62 years old [place] American female with history of multiple myeloma currently on treatment with Revlimid and low-dose Decadron and tolerating her treatment fairly well. Patient was discussed with him also seen by Dr. Arbutus Ped. He reviewed the results of her protein studies. Her protein studies are relatively stable showing no significant evidence for progression of disease. She will continue on her current doses of Revlimid and low-dose Decadron. She'll return in one month for another symptom management visit with a repeat CBC differential, C. met and LDH.  Darlene Howard, Aeisha Minarik E, PA-C    She was advised to call immediately if she has any concerning symptoms and interval.  The patient voices understanding of current disease status and treatment options and is in agreement with the current care plan.  All questions were answered. The patient knows to call the clinic with any problems, questions or concerns. We can certainly see the patient much sooner if necessary.   ADDENDUM: Hematology/Oncology Attending: I had face to face encounter with the patient. I recommended her care plan. The patient is doing fine today with no specific complaints. She is tolerating her treatment with Revlimid and low-dose Decadron fairly well. His myeloma panel showed no significant evidence for disease progression. I discussed the lab result with the patient and recommended for  her to continue on Revlimid and Decadron with the same doses. She would come back for followup visit in one month's for reevaluation. She was advised to call immediately if she has any concerning symptoms in the interval. Lajuana Matte., MD 10/10/2013

## 2013-10-09 NOTE — Patient Instructions (Signed)
Your protein studies show stable disease Continue Revlimid and dexamethasone at the current doses Followup in one month for another symptom management visit

## 2013-10-14 ENCOUNTER — Other Ambulatory Visit: Payer: Self-pay | Admitting: Family Medicine

## 2013-10-14 LAB — HEMOGLOBIN A1C: Mean Plasma Glucose: 134 mg/dL — ABNORMAL HIGH (ref <117)

## 2013-10-15 ENCOUNTER — Other Ambulatory Visit: Payer: Self-pay | Admitting: *Deleted

## 2013-10-15 ENCOUNTER — Other Ambulatory Visit: Payer: Self-pay

## 2013-10-15 DIAGNOSIS — C9000 Multiple myeloma not having achieved remission: Secondary | ICD-10-CM

## 2013-10-15 LAB — COMPLETE METABOLIC PANEL WITH GFR
ALT: 30 U/L (ref 0–35)
AST: 20 U/L (ref 0–37)
Albumin: 3.9 g/dL (ref 3.5–5.2)
Alkaline Phosphatase: 50 U/L (ref 39–117)
BUN: 15 mg/dL (ref 6–23)
CO2: 28 mEq/L (ref 19–32)
Calcium: 9.5 mg/dL (ref 8.4–10.5)
Chloride: 103 mEq/L (ref 96–112)
Potassium: 3.6 mEq/L (ref 3.5–5.3)
Sodium: 141 mEq/L (ref 135–145)
Total Bilirubin: 1.4 mg/dL — ABNORMAL HIGH (ref 0.3–1.2)

## 2013-10-15 LAB — LIPID PANEL
Cholesterol: 157 mg/dL (ref 0–200)
LDL Cholesterol: 75 mg/dL (ref 0–99)
Total CHOL/HDL Ratio: 2.9 Ratio
VLDL: 28 mg/dL (ref 0–40)

## 2013-10-15 MED ORDER — LENALIDOMIDE 15 MG PO CAPS
ORAL_CAPSULE | ORAL | Status: DC
Start: 2013-10-15 — End: 2013-11-04

## 2013-10-15 MED ORDER — METFORMIN HCL 1000 MG PO TABS: ORAL_TABLET | ORAL | Status: DC

## 2013-10-20 ENCOUNTER — Encounter (INDEPENDENT_AMBULATORY_CARE_PROVIDER_SITE_OTHER): Payer: Self-pay

## 2013-10-20 ENCOUNTER — Telehealth (HOSPITAL_COMMUNITY): Payer: Self-pay | Admitting: Dietician

## 2013-10-20 ENCOUNTER — Encounter: Payer: Self-pay | Admitting: Family Medicine

## 2013-10-20 ENCOUNTER — Ambulatory Visit (INDEPENDENT_AMBULATORY_CARE_PROVIDER_SITE_OTHER): Payer: 59 | Admitting: Family Medicine

## 2013-10-20 VITALS — BP 128/78 | HR 68 | Resp 18 | Ht 66.0 in | Wt 211.1 lb

## 2013-10-20 DIAGNOSIS — I10 Essential (primary) hypertension: Secondary | ICD-10-CM

## 2013-10-20 DIAGNOSIS — E119 Type 2 diabetes mellitus without complications: Secondary | ICD-10-CM

## 2013-10-20 DIAGNOSIS — E669 Obesity, unspecified: Secondary | ICD-10-CM

## 2013-10-20 NOTE — Patient Instructions (Addendum)
Pelvic and pap and breast in 4 month, call if you need me before  Fasting lipid,cmp and EGFR, HBA1c, TSH in 4 month   It is important that you exercise regularly at least 30 minutes 5 times a week. If you develop chest pain, have severe difficulty breathing, or feel very tired, stop exercising immediately and seek medical attention    A healthy diet is rich in fruit, vegetables and whole grains. Poultry fish, nuts and beans are a healthy choice for protein rather then red meat. A low sodium diet and drinking 64 ounces of water daily is generally recommended. Oils and sweet should be limited. Carbohydrates especially for those who are diabetic or overweight, should be limited to 60-45 gram per meal. It is important to eat on a regular schedule, at least 3 times daily. Snacks should be primarily fruits, vegetables or nuts.   You will be referred for individual counseling.  Please start daily exercise at 20 mins /day total  Weight loss goal 2 to 2.5 pounds per month

## 2013-10-20 NOTE — Progress Notes (Signed)
  Subjective:    Patient ID: Darlene Howard, female    DOB: July 11, 1951, 62 y.o.   MRN: [id number]  HPI The PT is here for follow up and re-evaluation of chronic medical conditions, medication management and review of any available recent lab and radiology data.  Preventive health is updated, specifically  Cancer screening and Immunization.   Questions or concerns regarding consultations or procedures which the PT has had in the interim are  addressed. The PT denies any adverse reactions to current medications since the last visit.  There are no new concerns.  There are no specific complaints       Review of Systems See HPI Denies recent fever or chills. Denies sinus pressure, nasal congestion, ear pain or sore throat. Denies chest congestion, productive cough or wheezing. Denies chest pains, palpitations and leg swelling Denies abdominal pain, nausea, vomiting,diarrhea or constipation.   Denies dysuria, frequency, hesitancy or incontinence. Denies joint pain, swelling and limitation in mobility. Denies headaches, seizures, numbness, or tingling. Denies depression, anxiety or insomnia. Denies skin break down or rash.        Objective:   Physical Exam  Patient alert and oriented and in no cardiopulmonary distress.  HEENT: No facial asymmetry, EOMI, no sinus tenderness,  oropharynx pink and moist.  Neck supple no adenopathy.  Chest: Clear to auscultation bilaterally.  CVS: S1, S2 no murmurs, no S3.  ABD: Soft non tender. Bowel sounds normal.  Ext: No edema  MS: Adequate ROM spine, shoulders, hips and knees.  Skin: Intact, no ulcerations or rash noted.  Psych: Good eye contact, normal affect. Memory intact not anxious or depressed appearing.  CNS: CN 2-12 intact, power, tone and sensation normal throughout.       Assessment & Plan:

## 2013-10-20 NOTE — Telephone Encounter (Signed)
Received referral via fax from Dr. [name] for dx: type 2 diabetes.

## 2013-10-21 MED ORDER — PANTOPRAZOLE SODIUM 40 MG PO TBEC: DELAYED_RELEASE_TABLET | ORAL | Status: DC

## 2013-10-21 MED ORDER — POTASSIUM CHLORIDE CRYS ER 20 MEQ PO TBCR: EXTENDED_RELEASE_TABLET | ORAL | Status: DC

## 2013-10-21 MED ORDER — ALENDRONATE SODIUM 70 MG PO TABS: ORAL_TABLET | ORAL | Status: DC

## 2013-10-21 MED ORDER — VALSARTAN-HYDROCHLOROTHIAZIDE 320-25 MG PO TABS: ORAL_TABLET | ORAL | Status: DC

## 2013-10-21 NOTE — Telephone Encounter (Signed)
Noted  

## 2013-10-21 NOTE — Telephone Encounter (Signed)
Called pt at 1109. Appointment scheduled for 11/10/13 at 1400.

## 2013-10-26 NOTE — Assessment & Plan Note (Signed)
Controlled, no change in medication Patient advised to reduce carb and sweets, commit to regular physical activity, take meds as prescribed, test blood as directed, and attempt to lose weight, to improve blood sugar control.  

## 2013-10-26 NOTE — Assessment & Plan Note (Signed)
Controlled, no change in medication DASH diet and commitment to daily physical activity for a minimum of 30 minutes discussed and encouraged, as a part of hypertension management. The importance of attaining a healthy weight is also discussed.  

## 2013-10-26 NOTE — Assessment & Plan Note (Signed)
Deteriorated. Patient re-educated about  the importance of commitment to a  minimum of 150 minutes of exercise per week. The importance of healthy food choices with portion control discussed. Encouraged to start a food diary, count calories and to consider  joining a support group. Sample diet sheets offered. Goals set by the patient for the next several months.    

## 2013-10-30 ENCOUNTER — Telehealth: Payer: Self-pay | Admitting: Medical Oncology

## 2013-10-30 NOTE — Telephone Encounter (Signed)
Faxed records per [facility] request for f/u .

## 2013-11-03 ENCOUNTER — Other Ambulatory Visit: Payer: Self-pay | Admitting: Family Medicine

## 2013-11-04 ENCOUNTER — Other Ambulatory Visit: Payer: Self-pay | Admitting: *Deleted

## 2013-11-04 ENCOUNTER — Encounter (INDEPENDENT_AMBULATORY_CARE_PROVIDER_SITE_OTHER): Payer: Self-pay

## 2013-11-04 ENCOUNTER — Encounter: Payer: Self-pay | Admitting: Internal Medicine

## 2013-11-04 ENCOUNTER — Ambulatory Visit (HOSPITAL_BASED_OUTPATIENT_CLINIC_OR_DEPARTMENT_OTHER): Payer: 59 | Admitting: Internal Medicine

## 2013-11-04 ENCOUNTER — Other Ambulatory Visit (HOSPITAL_BASED_OUTPATIENT_CLINIC_OR_DEPARTMENT_OTHER): Payer: 59 | Admitting: Lab

## 2013-11-04 ENCOUNTER — Telehealth: Payer: Self-pay | Admitting: Internal Medicine

## 2013-11-04 ENCOUNTER — Telehealth: Payer: Self-pay | Admitting: *Deleted

## 2013-11-04 VITALS — BP 126/63 | HR 72 | Temp 97.6°F | Resp 18 | Ht 66.0 in | Wt 211.8 lb

## 2013-11-04 DIAGNOSIS — E119 Type 2 diabetes mellitus without complications: Secondary | ICD-10-CM

## 2013-11-04 DIAGNOSIS — C9002 Multiple myeloma in relapse: Secondary | ICD-10-CM

## 2013-11-04 DIAGNOSIS — C9000 Multiple myeloma not having achieved remission: Secondary | ICD-10-CM

## 2013-11-04 DIAGNOSIS — I10 Essential (primary) hypertension: Secondary | ICD-10-CM

## 2013-11-04 LAB — CBC WITH DIFFERENTIAL/PLATELET
EOS%: 3.2 % (ref 0.0–7.0)
HCT: 34.6 % — ABNORMAL LOW (ref 34.8–46.6)
LYMPH%: 18.7 % (ref 14.0–49.7)
MCH: 31.5 pg (ref 25.1–34.0)
MCHC: 32.2 g/dL (ref 31.5–36.0)
MCV: 97.6 fL (ref 79.5–101.0)
MONO%: 15.6 % — ABNORMAL HIGH (ref 0.0–14.0)
RBC: 3.55 10*6/uL — ABNORMAL LOW (ref 3.70–5.45)
RDW: 16.3 % — ABNORMAL HIGH (ref 11.2–14.5)

## 2013-11-04 LAB — COMPREHENSIVE METABOLIC PANEL (CC13)
ALT: 26 U/L (ref 0–55)
AST: 18 U/L (ref 5–34)
Albumin: 3.2 g/dL — ABNORMAL LOW (ref 3.5–5.0)
Alkaline Phosphatase: 53 U/L (ref 40–150)
Potassium: 3.8 mEq/L (ref 3.5–5.1)
Sodium: 141 mEq/L (ref 136–145)
Total Bilirubin: 1.21 mg/dL — ABNORMAL HIGH (ref 0.20–1.20)
Total Protein: 6.7 g/dL (ref 6.4–8.3)

## 2013-11-04 MED ORDER — LENALIDOMIDE 15 MG PO CAPS: ORAL_CAPSULE | ORAL | Status: DC

## 2013-11-04 NOTE — Progress Notes (Signed)
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  2) the patient had evidence for disease recurrence in August of 2008 and she was treated with 6 cycles of Velcade completed on 03/30/2008 with response to the treatment but was discontinued secondary to neuropathy.  3) maintenance treatment with Revlimid 10 mg by mouth daily by the does was later increase to 25 mg by mouth daily secondary to biochemical recurrence with stabilization of her disease.   CURRENT THERAPY: Revlimid 15 mg by mouth daily for 2 weeks every 3 weeks in addition to Decadron currently at 4 mg by mouth on a weekly basis. She is expected to start the next cycle of her treatment on 11/10/2013.  INTERVAL HISTORY: Darlene Howard 62 y.o. female returns to the clinic today for routine monthly followup visit. She is tolerating her treatment with maintenance Revlimid fairly well with no significant adverse effects. The patient denied having any fever or chills. She denied having any chest pain, shortness of breath, cough or hemoptysis. She has no weight loss or night sweats. Her most recent myeloma panel showed no significant evidence for disease progression.  MEDICAL HISTORY: Past Medical History  Diagnosis Date  . Glaucoma   . Obesity   . Hypertension   . Personal history of colon cancer     next   . Diabetes mellitus   . Hyperlipidemia   . Multiple myeloma   . Allergy   . GERD (gastroesophageal reflux disease)   . Herpes     ALLERGIES:  is allergic to codeine.  MEDICATIONS:  Current Outpatient Prescriptions  Medication Sig Dispense  Refill  . alendronate (FOSAMAX) 70 MG tablet TAKE 1 TAB EACH WEEK 30 MIN PRIOR TO BREAKFAST WITH LARGE GLASS OF WATER ON AN EMPTY STOMACH. REMAIN UPRIGHT.  4 tablet  3  . amLODipine (NORVASC) 2.5 MG tablet TAKE 1 TABLET BY MOUTH ONCE A DAY.  90 tablet  1  . aspirin (ASPIRIN LOW DOSE) 81 MG EC tablet Take 81 mg by mouth daily.        . bimatoprost (LUMIGAN) 0.03 % ophthalmic drops Place 1 drop into both eyes at bedtime.        . calcium-vitamin D (OSCAL 500/200 D-3) 500-200 MG-UNIT per tablet Take 1 tablet by mouth 3 (three) times daily.        Marland Kitchen conjugated estrogens (PREMARIN) vaginal cream Place 1 g vaginally. Insert into vaginal 3 times a week        . dexamethasone (DECADRON) 4 MG tablet Take 1 tablet (4 mg total) by mouth once a week.  30 tablet  1  . dorzolamide-timolol (COSOPT) 22.3-6.8 MG/ML ophthalmic solution Place 1 drop into both eyes 2 (two) times daily.      . Lancets (ONETOUCH ULTRASOFT) lancets TEST ONCE DAILY OR AS DIRECTED.  100 each  5  . lenalidomide (REVLIMID) 15 MG capsule Take one capsule daily for 14 days.  Then 7 days off.  14 capsule  0  . loratadine (CLARITIN) 10 MG tablet TAKE ONE TABLET BY MOUTH ONCE DAILY.  30 tablet  5  . metFORMIN (GLUCOPHAGE) 1000 MG  tablet TAKE 1 TABLET TWICE A DAY  180 tablet  1  . Multiple Vitamins-Minerals (CENTRUM SILVER PO) Take by mouth daily.        . ONE TOUCH ULTRA TEST test strip USE AS DIRECTED TO CHECK DAILY.  50 each  5  . pantoprazole (PROTONIX) 40 MG tablet TAKE ONE TABLET BY MOUTH EVERY DAY FOR ACID REFLUX.  30 tablet  5  . potassium chloride SA (K-DUR,KLOR-CON) 20 MEQ tablet TAKE 1 TABLET BY MOUTH TWICE DAILY.  60 tablet  5  . valACYclovir (VALTREX) 1000 MG tablet Take 1 tablet (1,000 mg total) by mouth daily as needed.  30 tablet  prn  . valsartan-hydrochlorothiazide (DIOVAN-HCT) 320-25 MG per tablet TAKE ONE TABLET BY MOUTH ONCE DAILY.  30 tablet  5  . warfarin (COUMADIN) 2 MG tablet Take 1 tablet (2 mg total) by mouth daily.   30 tablet  3   No current facility-administered medications for this visit.    SURGICAL HISTORY:  Past Surgical History  Procedure Laterality Date  . Partial hysterectomy fibroids  1980  . Bone marrow transplant      REVIEW OF SYSTEMS:  A comprehensive review of systems was negative except for: Respiratory: positive for cough   PHYSICAL EXAMINATION: General appearance: alert, cooperative and no distress Head: Normocephalic, without obvious abnormality, atraumatic Neck: no adenopathy Lymph nodes: Cervical, supraclavicular, and axillary nodes normal. Resp: clear to auscultation bilaterally Cardio: regular rate and rhythm, S1, S2 normal, no murmur, click, rub or gallop GI: soft, non-tender; bowel sounds normal; no masses,  no organomegaly Extremities: extremities normal, atraumatic, no cyanosis or edema  ECOG PERFORMANCE STATUS: 1 - Symptomatic but completely ambulatory  Blood pressure 126/63, pulse 72, temperature 97.6 F (36.4 C), temperature source Oral, resp. rate 18, height 5\' 6"  (1.676 m), weight 211 lb 12.8 oz (96.072 kg).  LABORATORY DATA: Lab Results  Component Value Date   WBC 7.8 11/04/2013   HGB 11.2* 11/04/2013   HCT 34.6* 11/04/2013   MCV 97.6 11/04/2013   PLT 140* 11/04/2013      Chemistry      Component Value Date/Time   NA 141 10/14/2013 1206   NA 141 10/02/2013 1448   K 3.6 10/14/2013 1206   K 3.5 10/02/2013 1448   CL 103 10/14/2013 1206   CL 105 06/17/2013 1441   CO2 28 10/14/2013 1206   CO2 27 10/02/2013 1448   BUN 15 10/14/2013 1206   BUN 11.6 10/02/2013 1448   CREATININE 0.82 10/14/2013 1206   CREATININE 0.8 10/02/2013 1448   CREATININE 0.78 08/22/2012 1445      Component Value Date/Time   CALCIUM 9.5 10/14/2013 1206   CALCIUM 9.8 10/02/2013 1448   ALKPHOS 50 10/14/2013 1206   ALKPHOS 56 10/02/2013 1448   AST 20 10/14/2013 1206   AST 21 10/02/2013 1448   ALT 30 10/14/2013 1206   ALT 23 10/02/2013 1448   BILITOT 1.4* 10/14/2013 1206   BILITOT 1.22*  10/02/2013 1448       RADIOGRAPHIC STUDIES: No results found.  ASSESSMENT AND PLAN: This is a very pleasant 62 years old [place] American female with history of multiple myeloma currently on treatment with Revlimid and low-dose Decadron and tolerating her treatment fairly well. I recommended for the patient to continue her current treatment with the same regimen.  Her serum creatinine has been slowly increasing and I will continue to monitor this closely. She was advised to call immediately if she has any concerning symptoms and  interval.  The patient voices understanding of current disease status and treatment options and is in agreement with the current care plan.  All questions were answered. The patient knows to call the clinic with any problems, questions or concerns. We can certainly see the patient much sooner if necessary.

## 2013-11-04 NOTE — Telephone Encounter (Signed)
Spoke with pt at home and gave pt [facility] phone number to take survey today for Revlimid refill.

## 2013-11-04 NOTE — Patient Instructions (Signed)
Followup visit in one month with repeat CBC, comprehensive metabolic panel and LDH.

## 2013-11-04 NOTE — Telephone Encounter (Signed)
Gave pt appt for lab and Md for December 2014 °

## 2013-11-10 ENCOUNTER — Encounter (HOSPITAL_COMMUNITY): Payer: Self-pay | Admitting: Dietician

## 2013-11-10 NOTE — Progress Notes (Signed)
Outpatient Initial Nutrition Assessment  Date:11/10/2013   Appt Start Time: 1402  Referring Physician: Dr. [name] Reason for Visit: obesity, diabetes  Nutrition Assessment:  Height: 5\' 6"  (167.6 cm)   Weight: 210 lb (95.255 kg)   IBW: 130#  %IBW: 162% UBW: 210#  %UBW: 100% Body mass index is 33.91 kg/(m^2). Meets criteria for obesity, class I.  Goal Weight: 189# (10% loss of current wt) Weight hx: Pt reports progressive wt gain for the past 7 years due to cancer treatments. Wt Readings from Last 10 Encounters:  11/04/13 211 lb 12.8 oz (96.072 kg)  10/20/13 211 lb 1.3 oz (95.745 kg)  10/07/13 212 lb 14.4 oz (96.571 kg)  09/08/13 208 lb 14.4 oz (94.756 kg)  08/25/13 213 lb 1.9 oz (96.671 kg)  08/11/13 210 lb 11.2 oz (95.573 kg)  07/29/13 213 lb 3.2 oz (96.707 kg)  06/17/13 208 lb 14.4 oz (94.756 kg)  05/20/13 210 lb 1.3 oz (95.292 kg)  05/19/13 210 lb 14.4 oz (95.664 kg)   Estimated nutritional needs:  Kcals/ day: 1500-1600 Protein (grams)/day: 76-95 Fluid (L)/ day: 1.5-1.6  PMH:  Past Medical History  Diagnosis Date  . Glaucoma   . Obesity   . Hypertension   . Personal history of colon cancer     next   . Diabetes mellitus   . Hyperlipidemia   . Multiple myeloma   . Allergy   . GERD (gastroesophageal reflux disease)   . Herpes     Medications:  Current Outpatient Rx  Name  Route  Sig  Dispense  Refill  . alendronate (FOSAMAX) 70 MG tablet      TAKE 1 TAB EACH WEEK 30 MIN PRIOR TO BREAKFAST WITH LARGE GLASS OF WATER ON AN EMPTY STOMACH. REMAIN UPRIGHT.   4 tablet   3   . amLODipine (NORVASC) 2.5 MG tablet      TAKE 1 TABLET BY MOUTH ONCE A DAY.   90 tablet   1   . aspirin (ASPIRIN LOW DOSE) 81 MG EC tablet   Oral   Take 81 mg by mouth daily.           . bimatoprost (LUMIGAN) 0.03 % ophthalmic drops   Both Eyes   Place 1 drop into both eyes at bedtime.           . calcium-vitamin D (OSCAL 500/200 D-3) 500-200 MG-UNIT per tablet   Oral   Take 1  tablet by mouth 3 (three) times daily.           Marland Kitchen conjugated estrogens (PREMARIN) vaginal cream   Vaginal   Place 1 g vaginally. Insert into vaginal 3 times a week           . dexamethasone (DECADRON) 4 MG tablet   Oral   Take 1 tablet (4 mg total) by mouth once a week.   30 tablet   1   . dorzolamide-timolol (COSOPT) 22.3-6.8 MG/ML ophthalmic solution   Both Eyes   Place 1 drop into both eyes 2 (two) times daily.         . Lancets (ONETOUCH ULTRASOFT) lancets      TEST ONCE DAILY OR AS DIRECTED.   100 each   5   . lenalidomide (REVLIMID) 15 MG capsule      Take one capsule daily for 14 days.  Then 7 days off.   14 capsule   0     Adult Female < Not Childbearing>Authorization #    ...   Marland Kitchen  loratadine (CLARITIN) 10 MG tablet      TAKE ONE TABLET BY MOUTH ONCE DAILY.   30 tablet   5   . metFORMIN (GLUCOPHAGE) 1000 MG tablet      TAKE 1 TABLET TWICE A DAY   180 tablet   1   . Multiple Vitamins-Minerals (CENTRUM SILVER PO)   Oral   Take by mouth daily.           . ONE TOUCH ULTRA TEST test strip      USE AS DIRECTED TO CHECK DAILY.   50 each   2   . pantoprazole (PROTONIX) 40 MG tablet      TAKE ONE TABLET BY MOUTH EVERY DAY FOR ACID REFLUX.   30 tablet   5   . potassium chloride SA (K-DUR,KLOR-CON) 20 MEQ tablet      TAKE 1 TABLET BY MOUTH TWICE DAILY.   60 tablet   5   . valACYclovir (VALTREX) 1000 MG tablet   Oral   Take 1 tablet (1,000 mg total) by mouth daily as needed.   30 tablet   prn   . valsartan-hydrochlorothiazide (DIOVAN-HCT) 320-25 MG per tablet      TAKE ONE TABLET BY MOUTH ONCE DAILY.   30 tablet   5   . warfarin (COUMADIN) 2 MG tablet   Oral   Take 1 tablet (2 mg total) by mouth daily.   30 tablet   3     Labs: CMP     Component Value Date/Time   NA 141 11/04/2013 1334   NA 141 10/14/2013 1206   K 3.8 11/04/2013 1334   K 3.6 10/14/2013 1206   CL 103 10/14/2013 1206   CL 105 06/17/2013 1441   CO2 25  11/04/2013 1334   CO2 28 10/14/2013 1206   GLUCOSE 105 11/04/2013 1334   GLUCOSE 117* 10/14/2013 1206   GLUCOSE 126* 06/17/2013 1441   BUN 14.6 11/04/2013 1334   BUN 15 10/14/2013 1206   CREATININE 0.9 11/04/2013 1334   CREATININE 0.82 10/14/2013 1206   CREATININE 0.78 08/22/2012 1445   CALCIUM 9.3 11/04/2013 1334   CALCIUM 9.5 10/14/2013 1206   PROT 6.7 11/04/2013 1334   PROT 6.8 10/14/2013 1206   ALBUMIN 3.2* 11/04/2013 1334   ALBUMIN 3.9 10/14/2013 1206   AST 18 11/04/2013 1334   AST 20 10/14/2013 1206   ALT 26 11/04/2013 1334   ALT 30 10/14/2013 1206   ALKPHOS 53 11/04/2013 1334   ALKPHOS 50 10/14/2013 1206   BILITOT 1.21* 11/04/2013 1334   BILITOT 1.4* 10/14/2013 1206   GFRNONAA >60 02/15/2010 1110   GFRAA  Value: >60        The eGFR has been calculated using the MDRD equation. This calculation has not been validated in all clinical situations. eGFR's persistently <60 mL/min signify possible Chronic Kidney Disease. 02/15/2010 1110    Lipid Panel     Component Value Date/Time   CHOL 157 10/14/2013 1206   TRIG 140 10/14/2013 1206   HDL 54 10/14/2013 1206   CHOLHDL 2.9 10/14/2013 1206   VLDL 28 10/14/2013 1206   LDLCALC 75 10/14/2013 1206     Lab Results  Component Value Date   HGBA1C 6.3* 10/14/2013   HGBA1C 5.5 05/14/2013   HGBA1C 6.2* 12/09/2012   Lab Results  Component Value Date   MICROALBUR 0.50 05/20/2013   LDLCALC 75 10/14/2013   CREATININE 0.9 11/04/2013     Lifestyle/ social habits: Ms. Convey lives in [place].  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 She is very active in her church, family, and community. She takes care of her disabled sister daily. She is also active in her church and a part of her senior center. Unfortunately, she is physically inactive. She has no hx of physical activity. Her biggest barrier is her legs, reporting they "get tired" after long distances. She is contemplating an exercise program.  Nutrition hx/habits: Ms. Rosch is  concerned about her weight and diabetes. She reports that she "doesn't eat right" and admits to overindulging in candy and soft drinks. She reports that she wants to lose weight now that her cancer is in remission. She reports cancer treatments during 2006 and 2006, for colon cancer and multiple myeloma, respectively. Her cancer is in remission and she is on a maintenance drug. She reports that she feels like some of her cancer treatments caused her to gain weight. She reports she has lost weight in the past, with diet pills, as well as due to illness. She reveals that she is able to stick with a regimen for a while, but cannot commit long term.  Diet recall: breakfast: bacon, whole wheat bread, water; Lunch: skip OR pbj sandwich OR 3 [employer] egg rolls; Dinner: sub sandwich. Snacks (PM and HS): include: peanuts, walnuts, candy, fruits, and chips. Pt reports she drinks 2 cans of regular soda daily.   Nutrition Diagnosis: Excessive carbohydrate intake r/t undesireable food choices, excessive snacking AEB diet recall, hx wt gain.  Nutrition Intervention: Nutrition rx: 1200 kcal NAS, diabetic diet; 3 meals per day; limit snacks to once daily (fruit, yogurt, or veggies); meals 4-5 hours apart; low calorie beverages only; physical activity as tolerated  Education/Counseling Provided: Educated pt on principles of diabetic diet. Discussed carbohydrate metabolism in relation to diabetes. Educated pt on basic self-management principles including: signs and symptoms of hyperglycemia and hypoglycemia, goals for fasting and postprandial blood sugars, goals for Hgb A1c, importance of checking feet, importance of keeping PCP appointments, and foot care. Educated pt on plate method, portion sizes, and sources of carbohydrate. Discussed importance of regular meal pattern. Discussed importance of adding sources of whole grains to diet to improve glycemic control. Also encouraged to choose low fat dairy, lean meats, and  whole fruits and vegetables more often. Discussed options of artificial sweeteners and encouraged pt to use which brand she liked best. Discussed nutritional content of foods commonly eaten and discussed healthier alternatives. Discussed importance of compliance to prevent further complications of disease. Educated pt on importance of physical activity (goal of at least 30 minutes 5 times per week) along with a healthy diet to achieve weight loss and glycemic goals. Encouraged slow, moderate weight loss of 1-2# per week, or 7-10% of current body weight. Provided "Carbohydrate Counting and Meal Planning" and "Diabetes and You" handouts. Used TeachBack to assess understanding.   Understanding, Motivation, Ability to Follow Recommendations: Expect fair compliance  Monitoring and Evaluation: Goals: 1) 1-2# wt loss per week; 2) 20 minutes physical activity 5 times per week; 3) Hgb A1c </= 6.3  Recommendations: 1) For weight loss: 1200-1300 kcals daily; 2) Do not eat in front of the TV; 3) Break physical activity up into smaller, more frequent sessions; 4) Drink water, flavored water, low calorie drink mixes, or water with fruit slices in stead of soda  F/U: PRN. Provided RD contact information.   Kimiko Common A. Mayford Knife, RD, LDN 11/10/2013  Appt EndTime: 1610

## 2013-11-24 ENCOUNTER — Other Ambulatory Visit: Payer: Self-pay | Admitting: *Deleted

## 2013-11-24 DIAGNOSIS — C9000 Multiple myeloma not having achieved remission: Secondary | ICD-10-CM

## 2013-11-24 MED ORDER — LENALIDOMIDE 15 MG PO CAPS: ORAL_CAPSULE | ORAL | Status: DC

## 2013-12-05 ENCOUNTER — Emergency Department (HOSPITAL_COMMUNITY)
Admission: EM | Admit: 2013-12-05 | Discharge: 2013-12-05 | Disposition: A | Discharge status: Home / Self Care | Payer: 59 | Source: Home / Self Care | Attending: Emergency Medicine | Admitting: Emergency Medicine

## 2013-12-05 ENCOUNTER — Emergency Department (INDEPENDENT_AMBULATORY_CARE_PROVIDER_SITE_OTHER): Payer: 59

## 2013-12-05 ENCOUNTER — Encounter (HOSPITAL_COMMUNITY): Payer: Self-pay | Admitting: Emergency Medicine

## 2013-12-05 DIAGNOSIS — J209 Acute bronchitis, unspecified: Secondary | ICD-10-CM

## 2013-12-05 MED ORDER — BENZONATATE 200 MG PO CAPS: 200.0000 mg | ORAL_CAPSULE | Freq: Three times a day (TID) | ORAL | Status: DC | PRN

## 2013-12-05 MED ORDER — CEFDINIR 300 MG PO CAPS: 300.0000 mg | ORAL_CAPSULE | Freq: Two times a day (BID) | ORAL | Status: DC

## 2013-12-05 MED ORDER — ALBUTEROL SULFATE HFA 108 (90 BASE) MCG/ACT IN AERS: 2.0000 | INHALATION_SPRAY | Freq: Four times a day (QID) | RESPIRATORY_TRACT | Status: DC | PRN

## 2013-12-05 NOTE — ED Notes (Signed)
C/o nonproductive cough that is worse at night. Chest congestion.  Sob.  Denies chest pain.  Right sided pain due to cough.  Fatigue. And generalized body aches.  Pt has tried otc meds robutussin and mucinex with mild relief in symptoms. Denies fever, n/v/d

## 2013-12-05 NOTE — ED Provider Notes (Signed)
Chief Complaint:   Chief Complaint  Patient presents with  . URI    History of Present Illness:   Christinea EVOLETT SOMARRIBA is a 62 year old female with multiple myeloma, high blood pressure, and diabetes who presents with a three-day history of nonproductive cough, wheezing, aching in her right lateral chest, fatigue, myalgias, nasal congestion with clear drainage, and headache. She denies any fever, sore throat, shortness of breath, or GI symptoms. She has had no known exposures. She's tried Mucinex and Robitussin without relief.  Review of Systems:  Other than noted above, the patient denies any of the following symptoms: Systemic:  No fevers, chills, sweats, weight loss or gain, fatigue, or tiredness. Eye:  No redness or discharge. ENT:  No ear pain, drainage, headache, nasal congestion, drainage, sinus pressure, difficulty swallowing, or sore throat. Neck:  No neck pain or swollen glands. Lungs:  No cough, sputum production, hemoptysis, wheezing, chest tightness, shortness of breath or chest pain. GI:  No abdominal pain, nausea, vomiting or diarrhea.  PMFSH:  Past medical history, family history, social history, meds, and allergies were reviewed. She is intolerant to codeine. Current meds include Fosamax, Norvasc, aspirin, Coumadin, Premarin cream, Cosopt, Revelimid, Claritin, metformin, Protonix, Klor-Con, Diovan/HCTZ, Decadron, and Valtrex. She has a history of multiple myeloma and has had a bone marrow transplant.  Physical Exam:   Vital signs:  BP 103/70  Pulse 110  Temp(Src) 98.6 F (37 C) (Oral)  Resp 18  SpO2 99% General:  Alert and oriented.  In no distress.  Skin warm and dry. Eye:  No conjunctival injection or drainage. Lids were normal. ENT:  TMs and canals were normal, without erythema or inflammation.  Nasal mucosa was clear and uncongested, without drainage.  Mucous membranes were moist.  Pharynx was clear with no exudate or drainage.  There were no oral ulcerations or  lesions. Neck:  Supple, no adenopathy, tenderness or mass. Lungs:  No respiratory distress.  Lungs were clear to auscultation, without wheezes, rales or rhonchi.  Breath sounds were clear and equal bilaterally.  Heart:  Regular rhythm, without gallops, murmers or rubs. Skin:  Clear, warm, and dry, without rash or lesions.  Radiology:  Dg Chest 2 View  12/05/2013   CLINICAL DATA:  Stent placement, cough, congestion  EXAM: CHEST  2 VIEW  COMPARISON:  03/01/2011  FINDINGS: The heart size and mediastinal contours are within normal limits. No acute infiltrate or pulmonary edema. Stable mild degenerative changes thoracic spine.  IMPRESSION: No active cardiopulmonary disease.   Electronically Signed   By: Natasha Mead M.D.   On: 12/05/2013 17:33   Assessment:  The encounter diagnosis was Acute bronchitis.  She is at high risk being immunosuppressed, therefore will cover with antibiotics.  Plan:   1.  Meds:  The following meds were prescribed:   Discharge Medication List as of 12/05/2013  5:44 PM    START taking these medications   Details  albuterol (PROVENTIL HFA;VENTOLIN HFA) 108 (90 BASE) MCG/ACT inhaler Inhale 2 puffs into the lungs every 6 (six) hours as needed for wheezing or shortness of breath., Starting 12/05/2013, Until Discontinued, Normal    benzonatate (TESSALON) 200 MG capsule Take 1 capsule (200 mg total) by mouth 3 (three) times daily as needed for cough., Starting 12/05/2013, Until Discontinued, Normal    cefdinir (OMNICEF) 300 MG capsule Take 1 capsule (300 mg total) by mouth 2 (two) times daily., Starting 12/05/2013, Until Discontinued, Normal        2.  Patient Education/Counseling:  The patient was given appropriate handouts, self care instructions, and instructed in symptomatic relief.   3.  Follow up:  The patient was told to follow up if no better in 3 to 4 days, if becoming worse in any way, and given some red flag symptoms such as fever or difficulty breathing which would  prompt immediate return.  Follow up here as necessary or with her primary care physician.      Reuben Likes, MD 12/05/13 2003

## 2013-12-07 ENCOUNTER — Ambulatory Visit (HOSPITAL_BASED_OUTPATIENT_CLINIC_OR_DEPARTMENT_OTHER): Payer: 59 | Admitting: Physician Assistant

## 2013-12-07 ENCOUNTER — Encounter: Payer: Self-pay | Admitting: Physician Assistant

## 2013-12-07 ENCOUNTER — Telehealth: Payer: Self-pay | Admitting: Internal Medicine

## 2013-12-07 ENCOUNTER — Other Ambulatory Visit (HOSPITAL_BASED_OUTPATIENT_CLINIC_OR_DEPARTMENT_OTHER): Payer: 59 | Admitting: Lab

## 2013-12-07 VITALS — BP 112/65 | HR 91 | Temp 97.0°F | Resp 18 | Ht 66.0 in | Wt 208.2 lb

## 2013-12-07 DIAGNOSIS — C9000 Multiple myeloma not having achieved remission: Secondary | ICD-10-CM

## 2013-12-07 LAB — CBC WITH DIFFERENTIAL/PLATELET
Basophils Absolute: 0 10*3/uL (ref 0.0–0.1)
EOS%: 3 % (ref 0.0–7.0)
HCT: 35.4 % (ref 34.8–46.6)
HGB: 11.7 g/dL (ref 11.6–15.9)
MCH: 32.3 pg (ref 25.1–34.0)
MONO#: 0.5 10*3/uL (ref 0.1–0.9)
MONO%: 7.6 % (ref 0.0–14.0)
NEUT#: 4.4 10*3/uL (ref 1.5–6.5)
NEUT%: 69.3 % (ref 38.4–76.8)
Platelets: 177 10*3/uL (ref 145–400)
RDW: 16.2 % — ABNORMAL HIGH (ref 11.2–14.5)
WBC: 6.3 10*3/uL (ref 3.9–10.3)
lymph#: 1.2 10*3/uL (ref 0.9–3.3)

## 2013-12-07 LAB — COMPREHENSIVE METABOLIC PANEL (CC13)
ALT: 26 U/L (ref 0–55)
AST: 23 U/L (ref 5–34)
Albumin: 3.4 g/dL — ABNORMAL LOW (ref 3.5–5.0)
Anion Gap: 9 mEq/L (ref 3–11)
BUN: 11.1 mg/dL (ref 7.0–26.0)
CO2: 26 mEq/L (ref 22–29)
Calcium: 10.1 mg/dL (ref 8.4–10.4)
Chloride: 105 mEq/L (ref 98–109)
Creatinine: 0.9 mg/dL (ref 0.6–1.1)
Potassium: 4 mEq/L (ref 3.5–5.1)

## 2013-12-07 NOTE — Telephone Encounter (Signed)
Gave appt for lab and md  for Janaury 2015

## 2013-12-07 NOTE — Progress Notes (Addendum)
[facility] [phone]   [phone]  SHARED VISIT PROGRESS NOTE  Darlene Overman, MD [address] [place] [address]  DIAGNOSIS: Stage IIA IgA kappa multiple myeloma diagnosed in 2006   PRIOR THERAPY:  1) status post treatment with thalidomide and Decadron followed by autologous peripheral blood stem cell transplant with high-dose melphalan on 04/26/2006 at [facility].  2) the patient had evidence for disease recurrence in August of 2008 and she was treated with 6 cycles of Velcade completed on 03/30/2008 with response to the treatment but was discontinued secondary to neuropathy.  3) maintenance treatment with Revlimid 10 mg by mouth daily by the does was later increase to 25 mg by mouth daily secondary to biochemical recurrence with stabilization of her disease.   CURRENT THERAPY: Revlimid 15 mg by mouth daily for 2 weeks every 3 weeks in addition to Decadron currently at 4 mg by mouth on a weekly basis. She is expected to start the next cycle of her treatment on 12/09/2013.  INTERVAL HISTORY: Darlene Howard 62 y.o. female returns to the clinic today for routine monthly followup visit. She is tolerating her treatment with maintenance Revlimid fairly well with no significant adverse effects. The patient denied having any fever or chills. She's currently being treated for a case of bronchitis/asthmatic bronchitis  With cefdinir, Tessalon perles and a ProAir inhaler.She denied having any chest pain, shortness of breath, cough or hemoptysis. She has no weight loss or night sweats. Her last myeloma panel showed no significant evidence for disease progression.  MEDICAL HISTORY: Past Medical History  Diagnosis Date  . Glaucoma   . Obesity   . Hypertension   . Personal history of colon cancer     next   . Diabetes mellitus   . Hyperlipidemia   . Multiple myeloma   . Allergy   . GERD (gastroesophageal reflux disease)   .  Herpes     ALLERGIES:  is allergic to codeine.  MEDICATIONS:  Current Outpatient Prescriptions  Medication Sig Dispense Refill  . albuterol (PROVENTIL HFA;VENTOLIN HFA) 108 (90 BASE) MCG/ACT inhaler Inhale 2 puffs into the lungs every 6 (six) hours as needed for wheezing or shortness of breath.  1 Inhaler  0  . alendronate (FOSAMAX) 70 MG tablet TAKE 1 TAB EACH WEEK 30 MIN PRIOR TO BREAKFAST WITH LARGE GLASS OF WATER ON AN EMPTY STOMACH. REMAIN UPRIGHT.  4 tablet  3  . amLODipine (NORVASC) 2.5 MG tablet TAKE 1 TABLET BY MOUTH ONCE A DAY.  90 tablet  1  . aspirin (ASPIRIN LOW DOSE) 81 MG EC tablet Take 81 mg by mouth daily.        . benzonatate (TESSALON) 200 MG capsule Take 1 capsule (200 mg total) by mouth 3 (three) times daily as needed for cough.  30 capsule  0  . bimatoprost (LUMIGAN) 0.03 % ophthalmic drops Place 1 drop into both eyes at bedtime.        . calcium-vitamin D (OSCAL 500/200 D-3) 500-200 MG-UNIT per tablet Take 1 tablet by mouth 3 (three) times daily.        . cefdinir (OMNICEF) 300 MG capsule Take 1 capsule (300 mg total) by mouth 2 (two) times daily.  20 capsule  0  . dexamethasone (DECADRON) 4 MG tablet Take 1 tablet (4 mg total) by mouth once a week.  30 tablet  1  . dorzolamide-timolol (COSOPT) 22.3-6.8 MG/ML ophthalmic solution  Place 1 drop into both eyes 2 (two) times daily.      . Lancets (ONETOUCH ULTRASOFT) lancets TEST ONCE DAILY OR AS DIRECTED.  100 each  5  . lenalidomide (REVLIMID) 15 MG capsule Take one capsule daily for 14 days.  Then 7 days off.  14 capsule  0  . loratadine (CLARITIN) 10 MG tablet TAKE ONE TABLET BY MOUTH ONCE DAILY.  30 tablet  5  . metFORMIN (GLUCOPHAGE) 1000 MG tablet TAKE 1 TABLET TWICE A DAY  180 tablet  1  . Multiple Vitamins-Minerals (CENTRUM SILVER PO) Take by mouth daily.        . ONE TOUCH ULTRA TEST test strip USE AS DIRECTED TO CHECK DAILY.  50 each  2  . pantoprazole (PROTONIX) 40 MG tablet TAKE ONE TABLET BY MOUTH EVERY DAY FOR  ACID REFLUX.  30 tablet  5  . potassium chloride SA (K-DUR,KLOR-CON) 20 MEQ tablet TAKE 1 TABLET BY MOUTH TWICE DAILY.  60 tablet  5  . valACYclovir (VALTREX) 1000 MG tablet Take 1 tablet (1,000 mg total) by mouth daily as needed.  30 tablet  prn  . valsartan-hydrochlorothiazide (DIOVAN-HCT) 320-25 MG per tablet TAKE ONE TABLET BY MOUTH ONCE DAILY.  30 tablet  5  . warfarin (COUMADIN) 2 MG tablet Take 1 tablet (2 mg total) by mouth daily.  30 tablet  3  . conjugated estrogens (PREMARIN) vaginal cream Place 1 g vaginally. Insert into vaginal 3 times a week         No current facility-administered medications for this visit.    SURGICAL HISTORY:  Past Surgical History  Procedure Laterality Date  . Partial hysterectomy fibroids  1980  . Bone marrow transplant      REVIEW OF SYSTEMS:  A comprehensive review of systems was negative except for: Respiratory: positive for cough   PHYSICAL EXAMINATION: General appearance: alert, cooperative and no distress Head: Normocephalic, without obvious abnormality, atraumatic Neck: no adenopathy Lymph nodes: Cervical, supraclavicular, and axillary nodes normal. Resp: rhonchi bilaterally Cardio: regular rate and rhythm, S1, S2 normal, no murmur, click, rub or gallop GI: soft, non-tender; bowel sounds normal; no masses,  no organomegaly Extremities: extremities normal, atraumatic, no cyanosis or edema  ECOG PERFORMANCE STATUS: 1 - Symptomatic but completely ambulatory  Blood pressure 112/65, pulse 91, temperature 97 F (36.1 C), temperature source Oral, resp. rate 18, height 5\' 6"  (1.676 m), weight 208 lb 3.2 oz (94.439 kg), SpO2 97.00%.  LABORATORY DATA: Lab Results  Component Value Date   WBC 6.3 12/07/2013   HGB 11.7 12/07/2013   HCT 35.4 12/07/2013   MCV 98.3 12/07/2013   PLT 177 12/07/2013      Chemistry      Component Value Date/Time   NA 140 12/07/2013 1357   NA 141 10/14/2013 1206   K 4.0 12/07/2013 1357   K 3.6 10/14/2013 1206   CL  103 10/14/2013 1206   CL 105 06/17/2013 1441   CO2 26 12/07/2013 1357   CO2 28 10/14/2013 1206   BUN 11.1 12/07/2013 1357   BUN 15 10/14/2013 1206   CREATININE 0.9 12/07/2013 1357   CREATININE 0.82 10/14/2013 1206   CREATININE 0.78 08/22/2012 1445      Component Value Date/Time   CALCIUM 10.1 12/07/2013 1357   CALCIUM 9.5 10/14/2013 1206   ALKPHOS 65 12/07/2013 1357   ALKPHOS 50 10/14/2013 1206   AST 23 12/07/2013 1357   AST 20 10/14/2013 1206   ALT 26 12/07/2013 1357  ALT 30 10/14/2013 1206   BILITOT 0.89 12/07/2013 1357   BILITOT 1.4* 10/14/2013 1206       RADIOGRAPHIC STUDIES: No results found.  ASSESSMENT AND PLAN: This is a very pleasant 62 years old [place] American female with history of multiple myeloma currently on treatment with Revlimid and low-dose Decadron and tolerating her treatment fairly well. Patient was discussed with him also seen by Dr. Arbutus Ped. She will continue on her Revlimid and Decadron at the current doses. She'll followup one month with repeat myeloma panel to reevaluate her disease. Patient is encouraged to complete her course of antibiotics for her bronchitis as prescribed and followup with her primary care physician as needed for the symptoms.  Her serum creatinine has been slowly increasing and we will continue to monitor this closely. She was advised to call immediately if she has any concerning symptoms and interval.  The patient voices understanding of current disease status and treatment options and is in agreement with the current care plan.  All questions were answered. The patient knows to call the clinic with any problems, questions or concerns. We can certainly see the patient much sooner if necessary.  Darlene Slipper PA-C  ADDENDUM: Hematology/Oncology Attending: I had a face to face encounter with the patient. I recommended her care plan. The patient is a very pleasant 62 years old [place] American female with history of multiple myeloma  currently on treatment with Revlimid and low-dose Decadron. She is tolerating her treatment fairly well with no significant adverse effects. I recommended for the patient to continue her current treatment with Revlimid and Decadron. She would come back for follow up visit in one month with repeat myeloma panel for restaging of her disease. She was advised to call immediately if she has any concerning symptoms in the interval.  Lajuana Matte., MD 12/08/2013

## 2013-12-08 NOTE — Patient Instructions (Signed)
Followup with Dr. Arbutus Ped in one month with a repeat protein studies to reevaluate your disease

## 2013-12-10 ENCOUNTER — Ambulatory Visit (INDEPENDENT_AMBULATORY_CARE_PROVIDER_SITE_OTHER): Payer: 59 | Admitting: Family Medicine

## 2013-12-10 ENCOUNTER — Encounter: Payer: Self-pay | Admitting: Family Medicine

## 2013-12-10 VITALS — BP 108/62 | HR 90 | Resp 18 | Ht 66.0 in | Wt 209.1 lb

## 2013-12-10 DIAGNOSIS — E119 Type 2 diabetes mellitus without complications: Secondary | ICD-10-CM

## 2013-12-10 DIAGNOSIS — J309 Allergic rhinitis, unspecified: Secondary | ICD-10-CM

## 2013-12-10 DIAGNOSIS — J209 Acute bronchitis, unspecified: Secondary | ICD-10-CM

## 2013-12-10 DIAGNOSIS — I10 Essential (primary) hypertension: Secondary | ICD-10-CM

## 2013-12-10 MED ORDER — METHYLPREDNISOLONE ACETATE 80 MG/ML IJ SUSP
80.0000 mg | Freq: Once | INTRAMUSCULAR | Status: AC
  Administered 2013-12-10: 80 mg via INTRAMUSCULAR

## 2013-12-10 MED ORDER — PREDNISONE 5 MG PO TABS: ORAL_TABLET | ORAL | Status: DC

## 2013-12-10 NOTE — Patient Instructions (Signed)
F/u as before  You are receiving depo medrol 80 mg  IM  In the office and prednisone sent in for several days, please take as directed  OK to use 1 sudafed once daily  for the next 3 to 5 days to reduce drainage   Please drink a lot of water

## 2013-12-12 NOTE — Assessment & Plan Note (Signed)
Controlled, no change in medication  

## 2013-12-12 NOTE — Assessment & Plan Note (Signed)
Uncontrolled , depo medrol in office followed by short course of prednisone °

## 2013-12-12 NOTE — Progress Notes (Signed)
   Subjective:    Patient ID: Darlene Howard, female    DOB: May 27, 1951, 62 y.o.   MRN: [id number]  HPI  8 day h/o increased head and chest congestion, worsened this past weekend , and pt was evaluated and treated at urgent care and is here for f/u Still c/o excessive nasal stuffiness and head pressure with clear drainage as well as excessive cough. Generalized weakness better as is her sleep. No recent fever documented,. States she over exposed herself last week and knew it  Review of Systems See HPI Denies chest pains, palpitations and leg swelling Denies abdominal pain, nausea, vomiting,diarrhea or constipation.   Denies dysuria, frequency, hesitancy or incontinence. Denies joint pain, swelling and limitation in mobility. Denies headaches, seizures, numbness, or tingling. Denies depression, anxiety or insomnia. Denies skin break down or rash.        Objective:   Physical Exam  Patient alert and oriented and in no cardiopulmonary distress.  HEENT: No facial asymmetry, EOMI, no sinus tenderness,  oropharynx pink and moist.  Neck supple no adenopathy.erythema and edema of nasal mucosa  Chest: adequate air entry, scattered crackles no wheezes  CVS: S1, S2 no murmurs, no S3.  ABD: Soft non tender. Bowel sounds normal.  Ext: No edema  MS: Adequate ROM spine, shoulders, hips and knees.  Skin: Intact, no ulcerations or rash noted.  Psych: Good eye contact, normal affect. Memory intact not anxious or depressed appearing.  CNS: CN 2-12 intact, power, tone and sensation normal throughout.       Assessment & Plan:

## 2013-12-12 NOTE — Assessment & Plan Note (Signed)
Continue meds started at urgent care

## 2013-12-16 ENCOUNTER — Other Ambulatory Visit: Payer: Self-pay | Admitting: *Deleted

## 2013-12-16 ENCOUNTER — Ambulatory Visit: Payer: Self-pay | Admitting: Obstetrics & Gynecology

## 2013-12-16 DIAGNOSIS — C9000 Multiple myeloma not having achieved remission: Secondary | ICD-10-CM

## 2013-12-16 MED ORDER — LENALIDOMIDE 15 MG PO CAPS: ORAL_CAPSULE | ORAL | Status: DC

## 2013-12-21 ENCOUNTER — Other Ambulatory Visit: Payer: Self-pay | Admitting: Family Medicine

## 2014-01-01 ENCOUNTER — Other Ambulatory Visit: Payer: Self-pay | Admitting: Ophthalmology

## 2014-01-01 NOTE — H&P (Signed)
Patient Record  Darlene Howard, Darlene Howard Patient Number:  [id number] Date of Birth:  [birth year] Age:  63 years old    Gender:  Female Date of Evaluation:  January 01, 2014  Chief Complaint:   The patient is referred for Cataract evaluation . She has a history of Glaucoma OU in good control. She reports not being able to read, difficulty reading road signs, not recognizing faces until people are up close, glare from head lights. History of Present Illness:   63 yo female for glaucoma follow up.  On lumigan and PF cosopt.  Diabetic.  CHO controlled.  No pain or discomfort.  No burning or itching. No pus or mucus.  C/O blurred vision.  Has cataracts.  Presents for evaluation. Past History:  Allergies:  codeine sulfate (nausea), Active Medications:   Ocular Medications:  Lumigan (bimatoprost) Drops 0.01% Apply 1 drop into both eyes every night, 2.5 ml Other Medications:  amlodipine (amlodipine) tablet 2.5 mg, Valtrex (valacyclovir) tablet 1 g, Premarin (conjugated estrogens) tablet 0.625 mg, REVLIMID (lenalidomide) capsule 15 mg, dexamethasone (dexamethasone) tablet 4 mg, Centrum Silver (multivitamin-minerals-lutein) tablet 1 tablet by mouth once a day tablet, potassium chloride (potassium chloride) tablet,ER particles/crystals 20 mEq 1 tablet by mouth twice a day tablet, Diovan HCT (valsartan-hydrochlorothiazide) tablet 320-25 mg 1 tablet by mouth once a day tablet, aspirin (aspirin) tablet,delayed release (DR/EC) 81 mg 1 tablet by mouth once a day tablet, , Calcium 600 + D(3) (calcium carbonate-vitamin d3) capsule 600 mg calcium- 200 unit, Tylenol Arthritis (acetaminophen) tablet extended release 650 mg, metformin (metformin) tablet extended release 24hr 1,000 mg 1 tablet by mouth once a day tablet, alendronate (alendronate) tablet 70 mg 1 tablet by mouth once a day tablet, Protonix (pantoprazole) Recon Soln 40 mg, loratadine (loratadine) tablet 10 mg 1 tablet by mouth once a day tablet, multivitamin  (multivitamin) capsule Birth History:  none Past Ocular History:   glaucoma  Past Medical History:   250.00 Diabetes mellitus, non-insulin dependent, high blood  pressure, Myeloma Past Surgical History:   hysterectomy  colon surgery Family History:  no amblyopia, no blindness, no cataracts, no crossed eyes, no diabetic retinopathy, + glaucoma (father), no macular degeneration, no retinal detachment, no cancer, no diabetes, no heart disease, no high blood pressure, no stroke Social History:   Smoking Status: never smoker  Alcohol:  none   Driving status:  driving Marital status:  single Review of Systems:   Constitutional:  no fever, no weight loss    Eyes: + blurred vision  Ear/Nose/Throat: + sinus problems  Cardiovascular:  no chest pain, no irregular heart beats    Respiratory:  no shortness of breath, no wheezing    Gastrointestinal:  no abdominal pain, no nausea    Genitourinary:  no blood in urine, no discomfort    Musculoskeletal:  no joint pain, no low back pain    Integumentary skin/breast:  no rashes, no skin tumors    Neurological:  no numbness, no weakness    Psychiatric:  no anxiety, no depression    Endocrine:  no heat intolerance, no thyroid problems    Hematologic/Lymphatic:  no anemia, no unusual bleeding    Allergic/Immunologic: + seasonal allergies  Examination:  Visual Acuity:   Distance VA cc:  OD: 20/200    OS: 20/200  Distance VA Viola:  OD: 20/200    OS: 20/200 IOP:  OD:  14     OS:  14    @ 02:45PM (Goldmann applanation) Manifest Refraction:  Sphere    Cyl Axis       VA         Add       VA Prism Base R:  -0.50 --1.25   75    20/50       +2.75                      L:  -2.00  -0.50  135    20/60       +2.75                       comments: 11/11/13  Confrontation visual field:  OU:  Normal  Motility:  OU:  Normal  Pupils:  OU:  Shape, size, direct and consensual reaction normal  Adnexa:  Preauricular LN, lacrimal drainage, lacrimal glands, orbit  normal  Eyelids:  Eyelids:  normal Conjunctiva:  OU:  bulbar, palpebral normal  Cornea:  OU:  epithelium, stroma, endothelium, tear film normal  Anterior Chamber:  OU:  depth normal, no cell, no flare, 3+ deep / clear  Iris:  OU:  normal Dilation:  OU: AK-Dilate @ 03:56PM  Lens:  OD:  1+ posterior subcapsular, 1+ cortical, 1+ nuclear sclerotic cataract OS:  3+ posterior subcapsular, 1+ cortical, 1+ nuclear sclerotic cataract  Vitreous:  OU:  normal  Optic Disc:  OD:  cupping: 0.6  OS:  cupping: 0.75   Macula:  OU:  normal  Vessels:  OU:  normal  Periphery:  OU:  Normal  A Scan / IOLMaster:  AScan predicts an Acrysof MA50BM + 21.50   power for emmetropia OS   Orientation to person, place and time:  Normal  Mood and affect:  Normal  Impression:  366.19  Combined Cataract OU: marked visual significance 365.11  Primary Open Angle Glaucoma OU 365.72  -Glaucoma, moderate  250.50  Diabetes, type II, with ophthalmic manifestations (not stated as controlled)  Plan/Treatment:  Cataract: We discussed the natural history of Cataracts with illustrations. We discussed the related symptoms, visual significance and when we intervene with surgery. We discussed the surgical techniques used, risks and benefits of surgery.  She indicates a desire to proceed with planned surgery for her left eye. I concur with that plan.  She indicated understanding our discussion and felt that her questions had been answered to her satisfaction.  She indicates that she desires to proceed with the recommended treatment/care plan.   Patient Instructions: Continue current management. Please do not eat anything after mignight the day before surgery. She may take morning medications with a sip of water.  Return to clinic:  01/21/2013 for post-operative follow-up  Schedule:  Phacoemulsification, Posterior Chamber Intraocular Lens Left Eye x 01/20/2014   (electronically signed)  Adonis Brook, MD

## 2014-01-04 ENCOUNTER — Encounter (INDEPENDENT_AMBULATORY_CARE_PROVIDER_SITE_OTHER): Payer: Self-pay

## 2014-01-04 ENCOUNTER — Other Ambulatory Visit (HOSPITAL_BASED_OUTPATIENT_CLINIC_OR_DEPARTMENT_OTHER): Payer: 59

## 2014-01-04 DIAGNOSIS — C9000 Multiple myeloma not having achieved remission: Secondary | ICD-10-CM

## 2014-01-04 LAB — COMPREHENSIVE METABOLIC PANEL (CC13)
ALT: 39 U/L (ref 0–55)
ANION GAP: 14 meq/L — AB (ref 3–11)
AST: 19 U/L (ref 5–34)
Albumin: 3.6 g/dL (ref 3.5–5.0)
Alkaline Phosphatase: 64 U/L (ref 40–150)
BUN: 14.9 mg/dL (ref 7.0–26.0)
CALCIUM: 10 mg/dL (ref 8.4–10.4)
CHLORIDE: 101 meq/L (ref 98–109)
CO2: 24 meq/L (ref 22–29)
Creatinine: 0.9 mg/dL (ref 0.6–1.1)
Glucose: 198 mg/dl — ABNORMAL HIGH (ref 70–140)
Potassium: 3.8 mEq/L (ref 3.5–5.1)
Sodium: 138 mEq/L (ref 136–145)
Total Bilirubin: 1.14 mg/dL (ref 0.20–1.20)
Total Protein: 8 g/dL (ref 6.4–8.3)

## 2014-01-04 LAB — CBC WITH DIFFERENTIAL/PLATELET
BASO%: 0.3 % (ref 0.0–2.0)
Basophils Absolute: 0 10*3/uL (ref 0.0–0.1)
EOS ABS: 0 10*3/uL (ref 0.0–0.5)
EOS%: 0.3 % (ref 0.0–7.0)
HEMATOCRIT: 36.6 % (ref 34.8–46.6)
HEMOGLOBIN: 11.8 g/dL (ref 11.6–15.9)
LYMPH#: 1.2 10*3/uL (ref 0.9–3.3)
LYMPH%: 10.1 % — ABNORMAL LOW (ref 14.0–49.7)
MCH: 32.4 pg (ref 25.1–34.0)
MCHC: 32.4 g/dL (ref 31.5–36.0)
MCV: 100 fL (ref 79.5–101.0)
MONO#: 0.5 10*3/uL (ref 0.1–0.9)
MONO%: 4.4 % (ref 0.0–14.0)
NEUT#: 9.8 10*3/uL — ABNORMAL HIGH (ref 1.5–6.5)
NEUT%: 84.9 % — AB (ref 38.4–76.8)
Platelets: 177 10*3/uL (ref 145–400)
RBC: 3.66 10*6/uL — ABNORMAL LOW (ref 3.70–5.45)
RDW: 16.9 % — ABNORMAL HIGH (ref 11.2–14.5)
WBC: 11.5 10*3/uL — ABNORMAL HIGH (ref 3.9–10.3)

## 2014-01-04 LAB — TECHNOLOGIST REVIEW

## 2014-01-04 LAB — LACTATE DEHYDROGENASE (CC13): LDH: 181 U/L (ref 125–245)

## 2014-01-05 LAB — KAPPA/LAMBDA LIGHT CHAINS
Kappa free light chain: 1.17 mg/dL (ref 0.33–1.94)
Kappa:Lambda Ratio: 0.97 (ref 0.26–1.65)
Lambda Free Lght Chn: 1.21 mg/dL (ref 0.57–2.63)

## 2014-01-05 LAB — IGG, IGA, IGM
IGA: 1690 mg/dL — AB (ref 69–380)
IGM, SERUM: 20 mg/dL — AB (ref 52–322)
IgG (Immunoglobin G), Serum: 487 mg/dL — ABNORMAL LOW (ref 690–1700)

## 2014-01-05 LAB — BETA 2 MICROGLOBULIN, SERUM: Beta-2 Microglobulin: 1.62 mg/L (ref 1.01–1.73)

## 2014-01-07 ENCOUNTER — Other Ambulatory Visit: Payer: Self-pay | Admitting: *Deleted

## 2014-01-07 DIAGNOSIS — C9000 Multiple myeloma not having achieved remission: Secondary | ICD-10-CM

## 2014-01-07 MED ORDER — LENALIDOMIDE 15 MG PO CAPS: ORAL_CAPSULE | ORAL | Status: DC

## 2014-01-11 ENCOUNTER — Encounter (HOSPITAL_COMMUNITY): Payer: Self-pay | Admitting: Pharmacy Technician

## 2014-01-13 ENCOUNTER — Telehealth: Payer: Self-pay | Admitting: Internal Medicine

## 2014-01-13 ENCOUNTER — Encounter: Payer: Self-pay | Admitting: Internal Medicine

## 2014-01-13 ENCOUNTER — Encounter (INDEPENDENT_AMBULATORY_CARE_PROVIDER_SITE_OTHER): Payer: Self-pay

## 2014-01-13 ENCOUNTER — Ambulatory Visit (HOSPITAL_BASED_OUTPATIENT_CLINIC_OR_DEPARTMENT_OTHER): Payer: 59 | Admitting: Internal Medicine

## 2014-01-13 VITALS — BP 135/65 | HR 79 | Temp 97.9°F | Resp 18 | Ht 66.0 in | Wt 216.1 lb

## 2014-01-13 DIAGNOSIS — R7989 Other specified abnormal findings of blood chemistry: Secondary | ICD-10-CM

## 2014-01-13 DIAGNOSIS — C9000 Multiple myeloma not having achieved remission: Secondary | ICD-10-CM

## 2014-01-13 MED ORDER — DEXAMETHASONE 4 MG PO TABS: ORAL_TABLET | ORAL | Status: DC

## 2014-01-13 NOTE — Telephone Encounter (Signed)
Gave pt appt for lab and MD on February 2015

## 2014-01-13 NOTE — Progress Notes (Signed)
[place] [phone]   [phone] OFFICE PROGRESS NOTE  [name], MD [address] [place] [address]  DIAGNOSIS: Stage IIA IgA kappa multiple myeloma diagnosed in 2006   PRIOR THERAPY:  1) status post treatment with thalidomide and Decadron followed by autologous peripheral blood stem cell transplant with high-dose melphalan on 04/26/2006 at [facility].  2) the patient had evidence for disease recurrence in August of 2008 and she was treated with 6 cycles of Velcade completed on 03/30/2008 with response to the treatment but was discontinued secondary to neuropathy.  3) maintenance treatment with Revlimid 10 mg by mouth daily by the does was later increase to 25 mg by mouth daily secondary to biochemical recurrence with stabilization of her disease.   CURRENT THERAPY: Revlimid 15 mg by mouth daily for 2 weeks every 3 weeks in addition to Decadron currently at 4 mg by mouth on a weekly basis. She is expected to start the next cycle of her treatment on 11/10/2013.  INTERVAL HISTORY: Darlene Howard 63 y.o. female returns to the clinic today for routine monthly followup visit. She is tolerating her treatment with maintenance Revlimid fairly well with no significant adverse effects. The patient denied having any fever or chills. She denied having any chest pain, shortness of breath, cough or hemoptysis. She has no weight loss or night sweats. The patient is tolerating her current treatment with Revlimid and Decadron fairly well except for occasional leg cramps. She had repeat myeloma panel performed recently and she is here for evaluation and discussion of her lab results.  MEDICAL HISTORY: Past Medical History  Diagnosis Date  . Glaucoma   . Obesity   . Hypertension   . Personal history of colon cancer     next   . Diabetes mellitus   . Hyperlipidemia   . Multiple myeloma   . Allergy   . GERD (gastroesophageal reflux  disease)   . Herpes     ALLERGIES:  is allergic to codeine.  MEDICATIONS:  Current Outpatient Prescriptions  Medication Sig Dispense Refill  . alendronate (FOSAMAX) 70 MG tablet Take 70 mg by mouth once a week. Take with a full glass of water on an empty stomach.      Marland Kitchen amLODipine (NORVASC) 2.5 MG tablet Take 2.5 mg by mouth daily.      Marland Kitchen aspirin (ASPIRIN LOW DOSE) 81 MG EC tablet Take 81 mg by mouth daily.        . bimatoprost (LUMIGAN) 0.03 % ophthalmic drops Place 1 drop into both eyes at bedtime.       . calcium-vitamin D (OSCAL 500/200 D-3) 500-200 MG-UNIT per tablet Take 1 tablet by mouth 3 (three) times daily.        Marland Kitchen dexamethasone (DECADRON) 4 MG tablet Take 1 tablet (4 mg total) by mouth once a week.  30 tablet  1  . dorzolamide-timolol (COSOPT) 22.3-6.8 MG/ML ophthalmic solution Place 1 drop into both eyes 2 (two) times daily.      . Lancets (ONETOUCH ULTRASOFT) lancets TEST ONCE DAILY OR AS DIRECTED.  100 each  5  . lenalidomide (REVLIMID) 15 MG capsule Take one capsule daily for 14 days.  Then 7 days off.  14 capsule  0  . loratadine (CLARITIN) 10 MG tablet Take 10 mg by mouth daily.      . metFORMIN (GLUCOPHAGE) 1000 MG tablet Take 1,000 mg by mouth daily with breakfast.      .  Multiple Vitamins-Minerals (CENTRUM SILVER PO) Take by mouth daily.        . ONE TOUCH ULTRA TEST test strip USE AS DIRECTED TO CHECK DAILY.  50 each  2  . pantoprazole (PROTONIX) 40 MG tablet Take 40 mg by mouth daily.      . potassium chloride SA (K-DUR,KLOR-CON) 20 MEQ tablet Take 20 mEq by mouth 2 (two) times daily.      . valACYclovir (VALTREX) 1000 MG tablet Take 1,000 mg by mouth daily as needed (for outbreaks).      . valsartan-hydrochlorothiazide (DIOVAN-HCT) 320-25 MG per tablet Take 1 tablet by mouth daily.      Marland Kitchen warfarin (COUMADIN) 2 MG tablet Take 1 tablet (2 mg total) by mouth daily.  30 tablet  3   No current facility-administered medications for this visit.    SURGICAL HISTORY:    Past Surgical History  Procedure Laterality Date  . Partial hysterectomy fibroids  1980  . Bone marrow transplant      REVIEW OF SYSTEMS:  Constitutional: negative Eyes: negative Ears, nose, mouth, throat, and face: negative Respiratory: negative Cardiovascular: negative Gastrointestinal: negative Genitourinary:negative Integument/breast: negative Hematologic/lymphatic: negative Musculoskeletal:negative, Occasional leg cramps Neurological: negative Behavioral/Psych: negative Endocrine: negative Allergic/Immunologic: negative   PHYSICAL EXAMINATION: General appearance: alert, cooperative and no distress Head: Normocephalic, without obvious abnormality, atraumatic Neck: no adenopathy Lymph nodes: Cervical, supraclavicular, and axillary nodes normal. Resp: clear to auscultation bilaterally Back: symmetric, no curvature. ROM normal. No CVA tenderness. Cardio: regular rate and rhythm, S1, S2 normal, no murmur, click, rub or gallop GI: soft, non-tender; bowel sounds normal; no masses,  no organomegaly Extremities: extremities normal, atraumatic, no cyanosis or edema Neurologic: Alert and oriented X 3, normal strength and tone. Normal symmetric reflexes. Normal coordination and gait  ECOG PERFORMANCE STATUS: 1 - Symptomatic but completely ambulatory  Blood pressure 135/65, pulse 79, temperature 97.9 F (36.6 C), temperature source Oral, resp. rate 18, height '5\' 6"'  (1.676 m), weight 216 lb 1.6 oz (98.022 kg).  LABORATORY DATA: Lab Results  Component Value Date   WBC 11.5* 01/04/2014   HGB 11.8 01/04/2014   HCT 36.6 01/04/2014   MCV 100.0 01/04/2014   PLT 177 01/04/2014      Chemistry      Component Value Date/Time   NA 138 01/04/2014 1419   NA 141 10/14/2013 1206   K 3.8 01/04/2014 1419   K 3.6 10/14/2013 1206   CL 103 10/14/2013 1206   CL 105 06/17/2013 1441   CO2 24 01/04/2014 1419   CO2 28 10/14/2013 1206   BUN 14.9 01/04/2014 1419   BUN 15 10/14/2013 1206   CREATININE 0.9  01/04/2014 1419   CREATININE 0.82 10/14/2013 1206   CREATININE 0.78 08/22/2012 1445      Component Value Date/Time   CALCIUM 10.0 01/04/2014 1419   CALCIUM 9.5 10/14/2013 1206   ALKPHOS 64 01/04/2014 1419   ALKPHOS 50 10/14/2013 1206   AST 19 01/04/2014 1419   AST 20 10/14/2013 1206   ALT 39 01/04/2014 1419   ALT 30 10/14/2013 1206   BILITOT 1.14 01/04/2014 1419   BILITOT 1.4* 10/14/2013 1206     Other lab results: IgG 487, IgA 1690, IgM 20, beta-2 microglobulin 1.62, free kappa light chain 1.17, free lambda light chain 1.21 with a kappa/lambda ratio of 0.97  RADIOGRAPHIC STUDIES: No results found.  ASSESSMENT AND PLAN: This is a very pleasant 63 years old [place] American female with history of multiple myeloma currently on treatment with  Revlimid and low-dose Decadron and tolerating her treatment fairly well. Her myeloma panel is stable today except for the increased IgA. I discussed the lab result with the patient today. I recommended for her to increase the dose of Decadron to 20 mg on a weekly basis in addition to her current treatment with Revlimid 15 mg by mouth daily for 3 weeks every 28 days. If she continues to have increase in the IgA level, I may consider increasing the dose of Revlimid to 25 mg or to switch her treatment to Velcade and Decadron. She would come back for followup visit in one month with repeat CBC, comprehensive metabolic panel and LDH Her serum creatinine has been slowly increasing and I will continue to monitor this closely. She was advised to call immediately if she has any concerning symptoms and interval.  The patient voices understanding of current disease status and treatment options and is in agreement with the current care plan.  All questions were answered. The patient knows to call the clinic with any problems, questions or concerns. We can certainly see the patient much sooner if necessary.

## 2014-01-13 NOTE — Patient Instructions (Signed)
Followup visit in one month with repeat blood work

## 2014-01-14 ENCOUNTER — Encounter (HOSPITAL_COMMUNITY)
Admission: RE | Admit: 2014-01-14 | Discharge: 2014-01-14 | Disposition: A | Discharge status: Home / Self Care | Payer: 59 | Source: Ambulatory Visit | Attending: Ophthalmology | Admitting: Ophthalmology

## 2014-01-14 ENCOUNTER — Encounter (HOSPITAL_COMMUNITY): Payer: Self-pay

## 2014-01-14 DIAGNOSIS — Z01810 Encounter for preprocedural cardiovascular examination: Secondary | ICD-10-CM | POA: Insufficient documentation

## 2014-01-14 DIAGNOSIS — Z01812 Encounter for preprocedural laboratory examination: Secondary | ICD-10-CM | POA: Insufficient documentation

## 2014-01-14 DIAGNOSIS — Z01818 Encounter for other preprocedural examination: Secondary | ICD-10-CM | POA: Insufficient documentation

## 2014-01-14 HISTORY — DX: Spontaneous ecchymoses: R23.3

## 2014-01-14 HISTORY — DX: Personal history of colon polyps, unspecified: Z86.0100

## 2014-01-14 HISTORY — DX: Other skin changes: R23.8

## 2014-01-14 HISTORY — DX: Personal history of colonic polyps: Z86.010

## 2014-01-14 LAB — CBC
HEMATOCRIT: 34.1 % — AB (ref 36.0–46.0)
Hemoglobin: 11.3 g/dL — ABNORMAL LOW (ref 12.0–15.0)
MCH: 32.8 pg (ref 26.0–34.0)
MCHC: 33.1 g/dL (ref 30.0–36.0)
MCV: 98.8 fL (ref 78.0–100.0)
Platelets: 133 10*3/uL — ABNORMAL LOW (ref 150–400)
RBC: 3.45 MIL/uL — AB (ref 3.87–5.11)
RDW: 15.1 % (ref 11.5–15.5)
WBC: 7.5 10*3/uL (ref 4.0–10.5)

## 2014-01-14 LAB — BASIC METABOLIC PANEL
BUN: 12 mg/dL (ref 6–23)
CALCIUM: 10 mg/dL (ref 8.4–10.5)
CO2: 28 mEq/L (ref 19–32)
Chloride: 101 mEq/L (ref 96–112)
Creatinine, Ser: 0.81 mg/dL (ref 0.50–1.10)
GFR, EST AFRICAN AMERICAN: 87 mL/min — AB (ref >90)
GFR, EST NON AFRICAN AMERICAN: 75 mL/min — AB (ref >90)
Glucose, Bld: 108 mg/dL — ABNORMAL HIGH (ref 70–99)
Potassium: 3.8 mEq/L (ref 3.7–5.3)
SODIUM: 142 meq/L (ref 137–147)

## 2014-01-14 LAB — PROTIME-INR
INR: 1.06 (ref 0.00–1.49)
Prothrombin Time: 13.6 seconds (ref 11.6–15.2)

## 2014-01-14 LAB — APTT: aPTT: 26 seconds (ref 24–37)

## 2014-01-14 NOTE — Progress Notes (Signed)
Pt doesn't have a cardiologist  Denies ever having an echo/stress test/heart cath  Medical Md is DR.[name]  CXR in epic from 12-05-13  Denies EKG in past yr

## 2014-01-14 NOTE — Pre-Procedure Instructions (Signed)
Darlene Howard  01/14/2014   Your procedure is scheduled on:  Wed, Jan 21 @ 9:50 AM  Report to [facility]trance A  at 7:45 AM.  Call this number if you have problems the morning of surgery: [phone]   Remember:   Do not eat food or drink liquids after midnight.   Take these medicines the morning of surgery with A SIP OF WATER: Amlodipine(Norvasc),Claritin(Loratadine),and Protonix(Pantoprazole)               No Goody's,BC's,Aleve,Ibuprofen,Fish Oil,or any Herbal Medications   Do not wear jewelry, make-up or nail polish.  Do not wear lotions, powders, or perfumes. You may wear deodorant.  Do not shave 48 hours prior to surgery.   Do not bring valuables to the hospital.  [facility] is not responsible                  for any belongings or valuables.               Contacts, dentures or bridgework may not be worn into surgery.  Leave suitcase in the car. After surgery it may be brought to your room.  For patients admitted to the hospital, discharge time is determined by your                treatment team.               Patients discharged the day of surgery will not be allowed to drive  home.    Special Instructions: Shower using CHG 2 nights before surgery and the night before surgery.  If you shower the day of surgery use CHG.  Use special wash - you have one bottle of CHG for all showers.  You should use approximately 1/3 of the bottle for each shower.   Please read over the following fact sheets that you were given: Pain Booklet, Coughing and Deep Breathing and Surgical Site Infection Prevention

## 2014-01-15 NOTE — Progress Notes (Signed)
Anesthesia Chart Review:  Patient is a 63 year old female scheduled for left eye cataract extraction on 01/20/14 by Dr. Anderson Malta.  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 PCP is Dr. [name]. HEM-ONC is Dr. [name].  EKG on 01/14/14 showed NSR, LAD, poor r wave progression--cannot rule out anterior infarct (age undetermined).  I think her EKG appears stable since 02/15/10.  She denied prior stress, echo, or cath.  She is on low dose warfarin as a precaution to prevent blood clots while on Revlimid, but does not have a history of DVT/PE. (Dr. Anderson Malta does not typically have patients stop warfarin for this procedure.)  CXR on 12/05/13 showed no active cardiopulmonary disease.  Preoperative labs noted.   If no acute changes then I would anticipate that she could proceed as planned.  [facility] Short Stay Center/Anesthesiology Phone [phone] 01/15/2014 1:13 PM

## 2014-01-19 MED ORDER — TETRACAINE HCL 0.5 % OP SOLN
2.0000 [drp] | OPHTHALMIC | Status: AC
  Administered 2014-01-20: 2 [drp] via OPHTHALMIC
  Filled 2014-01-19: qty 2

## 2014-01-19 MED ORDER — CYCLOPENTOLATE HCL 1 % OP SOLN
1.0000 [drp] | OPHTHALMIC | Status: AC | PRN
  Administered 2014-01-20 (×3): 1 [drp] via OPHTHALMIC
  Filled 2014-01-19: qty 2

## 2014-01-19 MED ORDER — GATIFLOXACIN 0.5 % OP SOLN
1.0000 [drp] | OPHTHALMIC | Status: AC | PRN
  Administered 2014-01-20 (×3): 1 [drp] via OPHTHALMIC
  Filled 2014-01-19: qty 2.5

## 2014-01-19 MED ORDER — PREDNISOLONE ACETATE 1 % OP SUSP
1.0000 [drp] | OPHTHALMIC | Status: AC
  Administered 2014-01-20: 1 [drp] via OPHTHALMIC
  Filled 2014-01-19: qty 5

## 2014-01-19 MED ORDER — PHENYLEPHRINE HCL 2.5 % OP SOLN
1.0000 [drp] | OPHTHALMIC | Status: AC | PRN
  Administered 2014-01-20 (×3): 1 [drp] via OPHTHALMIC
  Filled 2014-01-19: qty 15

## 2014-01-20 ENCOUNTER — Encounter (HOSPITAL_COMMUNITY)
Admission: RE | Disposition: A | Discharge status: Home / Self Care | Payer: Self-pay | Source: Ambulatory Visit | Attending: Ophthalmology

## 2014-01-20 ENCOUNTER — Encounter (HOSPITAL_COMMUNITY): Payer: Self-pay | Admitting: *Deleted

## 2014-01-20 ENCOUNTER — Ambulatory Visit (HOSPITAL_COMMUNITY)
Admission: RE | Admit: 2014-01-20 | Discharge: 2014-01-20 | Disposition: A | Discharge status: Home / Self Care | Payer: 59 | Source: Ambulatory Visit | Attending: Ophthalmology | Admitting: Ophthalmology

## 2014-01-20 ENCOUNTER — Encounter (HOSPITAL_COMMUNITY): Payer: 59 | Admitting: Vascular Surgery

## 2014-01-20 ENCOUNTER — Ambulatory Visit (HOSPITAL_COMMUNITY): Payer: 59 | Admitting: Certified Registered Nurse Anesthetist

## 2014-01-20 DIAGNOSIS — K219 Gastro-esophageal reflux disease without esophagitis: Secondary | ICD-10-CM | POA: Insufficient documentation

## 2014-01-20 DIAGNOSIS — E1139 Type 2 diabetes mellitus with other diabetic ophthalmic complication: Secondary | ICD-10-CM | POA: Insufficient documentation

## 2014-01-20 DIAGNOSIS — H25019 Cortical age-related cataract, unspecified eye: Secondary | ICD-10-CM | POA: Insufficient documentation

## 2014-01-20 DIAGNOSIS — H4011X0 Primary open-angle glaucoma, stage unspecified: Secondary | ICD-10-CM | POA: Insufficient documentation

## 2014-01-20 DIAGNOSIS — C9000 Multiple myeloma not having achieved remission: Secondary | ICD-10-CM | POA: Insufficient documentation

## 2014-01-20 DIAGNOSIS — H4010X0 Unspecified open-angle glaucoma, stage unspecified: Secondary | ICD-10-CM | POA: Insufficient documentation

## 2014-01-20 DIAGNOSIS — H25049 Posterior subcapsular polar age-related cataract, unspecified eye: Secondary | ICD-10-CM | POA: Insufficient documentation

## 2014-01-20 DIAGNOSIS — H2510 Age-related nuclear cataract, unspecified eye: Secondary | ICD-10-CM | POA: Insufficient documentation

## 2014-01-20 DIAGNOSIS — H409 Unspecified glaucoma: Secondary | ICD-10-CM | POA: Insufficient documentation

## 2014-01-20 DIAGNOSIS — I10 Essential (primary) hypertension: Secondary | ICD-10-CM | POA: Insufficient documentation

## 2014-01-20 HISTORY — PX: CATARACT EXTRACTION W/PHACO: SHX586

## 2014-01-20 LAB — GLUCOSE, CAPILLARY
Glucose-Capillary: 127 mg/dL — ABNORMAL HIGH (ref 70–99)
Glucose-Capillary: 109 mg/dL — ABNORMAL HIGH (ref 70–99)

## 2014-01-20 SURGERY — PHACOEMULSIFICATION, CATARACT, WITH IOL INSERTION
Anesthesia: Monitor Anesthesia Care | Site: Eye | Laterality: Left

## 2014-01-20 MED ORDER — CEFAZOLIN SUBCONJUNCTIVAL INJECTION 100 MG/0.5 ML
100.0000 mg | INJECTION | SUBCONJUNCTIVAL | Status: DC
  Filled 2014-01-20: qty 0.5

## 2014-01-20 MED ORDER — NA CHONDROIT SULF-NA HYALURON 40-30 MG/ML IO SOLN
INTRAOCULAR | Status: AC
  Filled 2014-01-20: qty 0.5

## 2014-01-20 MED ORDER — SODIUM CHLORIDE 0.9 % IV SOLN
INTRAVENOUS | Status: DC
  Administered 2014-01-20: 35 mL/h via INTRAVENOUS

## 2014-01-20 MED ORDER — ONDANSETRON HCL 4 MG/2ML IJ SOLN
INTRAMUSCULAR | Status: AC
  Filled 2014-01-20: qty 2

## 2014-01-20 MED ORDER — FENTANYL CITRATE 0.05 MG/ML IJ SOLN
INTRAMUSCULAR | Status: AC
  Filled 2014-01-20: qty 5

## 2014-01-20 MED ORDER — BACITRACIN-POLYMYXIN B 500-10000 UNIT/GM OP OINT
TOPICAL_OINTMENT | OPHTHALMIC | Status: AC
  Filled 2014-01-20: qty 3.5

## 2014-01-20 MED ORDER — SODIUM CHLORIDE 0.9 % IV SOLN
INTRAVENOUS | Status: DC | PRN
  Administered 2014-01-20 (×2): via INTRAVENOUS

## 2014-01-20 MED ORDER — MIDAZOLAM HCL 5 MG/5ML IJ SOLN
INTRAMUSCULAR | Status: DC | PRN
  Administered 2014-01-20: 2 mg via INTRAVENOUS

## 2014-01-20 MED ORDER — TETRACAINE HCL 0.5 % OP SOLN
OPHTHALMIC | Status: AC
  Filled 2014-01-20: qty 2

## 2014-01-20 MED ORDER — BUPIVACAINE HCL (PF) 0.75 % IJ SOLN
INTRAMUSCULAR | Status: AC
  Filled 2014-01-20: qty 10

## 2014-01-20 MED ORDER — LIDOCAINE HCL 2 % IJ SOLN
INTRAMUSCULAR | Status: AC
  Filled 2014-01-20: qty 20

## 2014-01-20 MED ORDER — CEFAZOLIN SUBCONJUNCTIVAL INJECTION 100 MG/0.5 ML
INJECTION | SUBCONJUNCTIVAL | Status: DC | PRN
  Administered 2014-01-20: 100 mg via SUBCONJUNCTIVAL

## 2014-01-20 MED ORDER — FENTANYL CITRATE 0.05 MG/ML IJ SOLN
INTRAMUSCULAR | Status: DC | PRN
  Administered 2014-01-20: 50 ug via INTRAVENOUS
  Administered 2014-01-20: 25 ug via INTRAVENOUS

## 2014-01-20 MED ORDER — STERILE WATER FOR IRRIGATION IR SOLN
Status: DC | PRN
  Administered 2014-01-20: 1000 mL

## 2014-01-20 MED ORDER — LIDOCAINE HCL 2 % IJ SOLN
INTRAMUSCULAR | Status: DC | PRN
  Administered 2014-01-20: 11:00:00 via RETROBULBAR

## 2014-01-20 MED ORDER — SODIUM HYALURONATE 10 MG/ML IO SOLN
INTRAOCULAR | Status: AC
  Filled 2014-01-20: qty 0.85

## 2014-01-20 MED ORDER — ACETAZOLAMIDE SODIUM 500 MG IJ SOLR
INTRAMUSCULAR | Status: AC
  Filled 2014-01-20: qty 500

## 2014-01-20 MED ORDER — 0.9 % SODIUM CHLORIDE (POUR BTL) OPTIME
TOPICAL | Status: DC | PRN
  Administered 2014-01-20: 1000 mL

## 2014-01-20 MED ORDER — EPINEPHRINE HCL 1 MG/ML IJ SOLN
INTRAMUSCULAR | Status: DC | PRN
  Administered 2014-01-20: 10:00:00

## 2014-01-20 MED ORDER — BACITRACIN-POLYMYXIN B 500-10000 UNIT/GM OP OINT
TOPICAL_OINTMENT | OPHTHALMIC | Status: DC | PRN
  Administered 2014-01-20: 1 via OPHTHALMIC

## 2014-01-20 MED ORDER — DEXAMETHASONE SODIUM PHOSPHATE 10 MG/ML IJ SOLN
INTRAMUSCULAR | Status: AC
  Filled 2014-01-20: qty 1

## 2014-01-20 MED ORDER — BSS IO SOLN
INTRAOCULAR | Status: AC
  Filled 2014-01-20: qty 500

## 2014-01-20 MED ORDER — TRIAMCINOLONE ACETONIDE 40 MG/ML IJ SUSP
INTRAMUSCULAR | Status: AC
  Filled 2014-01-20: qty 5

## 2014-01-20 MED ORDER — MIDAZOLAM HCL 2 MG/2ML IJ SOLN
INTRAMUSCULAR | Status: AC
  Filled 2014-01-20: qty 2

## 2014-01-20 MED ORDER — NA CHONDROIT SULF-NA HYALURON 40-30 MG/ML IO SOLN
INTRAOCULAR | Status: DC | PRN
  Administered 2014-01-20 (×2): 0.5 mL via INTRAOCULAR

## 2014-01-20 MED ORDER — PROPOFOL 10 MG/ML IV BOLUS
INTRAVENOUS | Status: DC | PRN
  Administered 2014-01-20 (×2): 20 mg via INTRAVENOUS

## 2014-01-20 MED ORDER — PROVISC 10 MG/ML IO SOLN
INTRAOCULAR | Status: DC | PRN
  Administered 2014-01-20: 0.85 mL via INTRAOCULAR

## 2014-01-20 MED ORDER — HYPROMELLOSE (GONIOSCOPIC) 2.5 % OP SOLN
OPHTHALMIC | Status: AC
  Filled 2014-01-20: qty 15

## 2014-01-20 MED ORDER — EPINEPHRINE HCL 1 MG/ML IJ SOLN
INTRAMUSCULAR | Status: AC
  Filled 2014-01-20: qty 1

## 2014-01-20 MED ORDER — PROPOFOL 10 MG/ML IV BOLUS
INTRAVENOUS | Status: AC
  Filled 2014-01-20: qty 20

## 2014-01-20 MED ORDER — ONDANSETRON HCL 4 MG/2ML IJ SOLN
INTRAMUSCULAR | Status: DC | PRN
  Administered 2014-01-20: 4 mg via INTRAVENOUS

## 2014-01-20 MED ORDER — HYPROMELLOSE (GONIOSCOPIC) 2.5 % OP SOLN
OPHTHALMIC | Status: DC | PRN
  Administered 2014-01-20: 2 [drp] via OPHTHALMIC

## 2014-01-20 MED ORDER — DEXAMETHASONE SODIUM PHOSPHATE 10 MG/ML IJ SOLN
INTRAMUSCULAR | Status: DC | PRN
Start: 2014-01-20 — End: 2014-01-20
  Administered 2014-01-20: 10 mg

## 2014-01-20 SURGICAL SUPPLY — 54 items
APL SRG 3 HI ABS STRL LF PLS (MISCELLANEOUS) ×1
APPLICATOR COTTON TIP 6IN STRL (MISCELLANEOUS) ×3 IMPLANT
APPLICATOR DR MATTHEWS STRL (MISCELLANEOUS) ×3 IMPLANT
BLADE KERATOME 2.75 (BLADE) ×2 IMPLANT
BLADE KERATOME 2.75MM (BLADE) ×1
BLADE STAB KNIFE 15DEG (BLADE) IMPLANT
COVER MAYO STAND STRL (DRAPES) ×3 IMPLANT
DRAPE OPHTHALMIC 77X100 STRL (CUSTOM PROCEDURE TRAY) ×3 IMPLANT
DRAPE POUCH INSTRU U-SHP 10X18 (DRAPES) ×3 IMPLANT
DRSG TEGADERM 4X4.75 (GAUZE/BANDAGES/DRESSINGS) ×3 IMPLANT
FILTER BLUE MILLIPORE (MISCELLANEOUS) IMPLANT
GLOVE SS BIOGEL STRL SZ 6.5 (GLOVE) ×1 IMPLANT
GLOVE SUPERSENSE BIOGEL SZ 6.5 (GLOVE) ×2
GOWN SRG XL XLNG 56XLVL 4 (GOWN DISPOSABLE) ×1 IMPLANT
GOWN STRL NON-REIN LRG LVL3 (GOWN DISPOSABLE) ×3 IMPLANT
GOWN STRL NON-REIN XL XLG LVL4 (GOWN DISPOSABLE) ×3
KIT BASIN OR (CUSTOM PROCEDURE TRAY) ×3 IMPLANT
KIT ROOM TURNOVER OR (KITS) ×3 IMPLANT
KNIFE GRIESHABER SHARP 2.5MM (MISCELLANEOUS) ×3 IMPLANT
LENS IOL ACRYSOF MP POST 21.5 (Intraocular Lens) ×2 IMPLANT
MASK EYE SHIELD (GAUZE/BANDAGES/DRESSINGS) ×2 IMPLANT
NDL 25GX 5/8IN NON SAFETY (NEEDLE) ×2 IMPLANT
NDL FILTER BLUNT 18X1 1/2 (NEEDLE) IMPLANT
NDL HYPO 30X.5 LL (NEEDLE) ×2 IMPLANT
NEEDLE 22X1 1/2 (OR ONLY) (NEEDLE) ×2 IMPLANT
NEEDLE 25GX 5/8IN NON SAFETY (NEEDLE) ×6 IMPLANT
NEEDLE FILTER BLUNT 18X 1/2SAF (NEEDLE) ×2
NEEDLE FILTER BLUNT 18X1 1/2 (NEEDLE) ×1 IMPLANT
NEEDLE HYPO 30X.5 LL (NEEDLE) ×6 IMPLANT
NS IRRIG 1000ML POUR BTL (IV SOLUTION) ×3 IMPLANT
PACK CATARACT CUSTOM (CUSTOM PROCEDURE TRAY) ×3 IMPLANT
PAD ARMBOARD 7.5X6 YLW CONV (MISCELLANEOUS) ×3 IMPLANT
PAD EYE OVAL STERILE LF (GAUZE/BANDAGES/DRESSINGS) IMPLANT
PAK PIK CVS CATARACT (OPHTHALMIC) ×3 IMPLANT
PROBE ANTERIOR 20G W/INFUS NDL (MISCELLANEOUS) IMPLANT
SHUTTLE MONARCH TYPE A (NEEDLE) ×3 IMPLANT
SPEAR EYE SURG WECK-CEL (MISCELLANEOUS) IMPLANT
SUT ETHILON 10-0 CS-B-6CS-B-6 (SUTURE)
SUT ETHILON 5 0 P 3 18 (SUTURE)
SUT ETHILON 9 0 TG140 8 (SUTURE) IMPLANT
SUT NYLON ETHILON 5-0 P-3 1X18 (SUTURE) IMPLANT
SUT PLAIN 6 0 TG1408 (SUTURE) IMPLANT
SUT POLY NON ABSORB 10-0 8 STR (SUTURE) IMPLANT
SUT VICRYL 6 0 S 29 12 (SUTURE) IMPLANT
SUTURE EHLN 10-0 CS-B-6CS-B-6 (SUTURE) IMPLANT
SYR 20CC LL (SYRINGE) IMPLANT
SYR TB 1ML LUER SLIP (SYRINGE) IMPLANT
SYRINGE 10CC LL (SYRINGE) IMPLANT
TAPE PAPER MEDFIX 1IN X 10YD (GAUZE/BANDAGES/DRESSINGS) ×2 IMPLANT
TIP ABS 45DEG FLARED 0.9MM (TIP) ×3 IMPLANT
TOWEL OR 17X24 6PK STRL BLUE (TOWEL DISPOSABLE) ×6 IMPLANT
WATER STERILE IRR 1000ML POUR (IV SOLUTION) ×3 IMPLANT
WIPE INSTRUMENT ADHESIVE BACK (MISCELLANEOUS) ×3 IMPLANT
WIPE INSTRUMENT VISIWIPE 73X73 (MISCELLANEOUS) ×3 IMPLANT

## 2014-01-20 NOTE — Discharge Instructions (Signed)
Darlene Howard      01/20/2014  Post-operative instructions for [name] L. Anderson Malta, MD  Caring for your eye:  Do not rub your eye and wash your hands before touching the eye area. This is important to avoid injury and infection.  You may use sterile gauze pads and sterile eye wash to cleanse the lid margins of mucous accumulation.  DO NOT REUSE GAUZE after wiping the eye. Use a new clean one if needed.  Be certain not to touch the top of the medication bottle to the eyelids to avoid contaminating your medicine bottle and causing infection.  After eye surgery the surface of the eye and eyelids may be puffy. You may note a red blotch(s) on the surface of the eye or a bruise on your eyelid. These are usually related to injections or instruments used in surgery and are not cause for alarm. You may also notice blood tinged tears on your eye pad, this is common and not cause for alarm. These findings will subside over the coming week or two.  Activity:  No jarring activities. Walking with assistance early on as needed is advised. Avoid straining and let me know if you have significant constipation. Do not bend over at the waist with the head below your waist to minimize risk of bleeding inside the eye.  Avoid getting water from washing your hair or showering  in your eye. Patch the eye if necessary during bathing to avoid contamination.    You may: watch television, work on you computer, read books, eat out, ride in a car.  Sleeping Position:  You may sleep in your  customary manner.  Wear your eye shield for naps and sleeping at night for the first two weeks.   Eye Medication:  Zymaxid 1 gtt in operative eye four times daily.                               Prednisolobne Acetate 1% 1 gtt in operative  Eye four times daily  Wait a few minutes between your eye drops when placing them.   Resume your customary medications on your normal schedule.   Adonis Brook MD    After office hours I  can be reached by calling:    [phone]

## 2014-01-20 NOTE — Anesthesia Postprocedure Evaluation (Signed)
  Anesthesia Post-op Note  Patient: Darlene Howard  Procedure(s) Performed: Procedure(s): CATARACT EXTRACTION PHACO AND INTRAOCULAR LENS PLACEMENT (IOC) (Left)  Patient Location: PACU  Anesthesia Type:MAC  Level of Consciousness: awake, alert  and oriented  Airway and Oxygen Therapy: Patient Spontanous Breathing  Post-op Pain: mild  Post-op Assessment: Post-op Vital signs reviewed  Post-op Vital Signs: Reviewed  Complications: No apparent anesthesia complications

## 2014-01-20 NOTE — Transfer of Care (Signed)
Immediate Anesthesia Transfer of Care Note  Patient: Darlene Howard  Procedure(s) Performed: Procedure(s): CATARACT EXTRACTION PHACO AND INTRAOCULAR LENS PLACEMENT (IOC) (Left)  Patient Location: PACU  Anesthesia Type:MAC  Level of Consciousness: awake, alert , oriented and patient cooperative  Airway & Oxygen Therapy: Patient Spontanous Breathing  Post-op Assessment: Report given to PACU RN, Post -op Vital signs reviewed and stable and Patient moving all extremities X 4  Post vital signs: Reviewed and stable  Complications: No apparent anesthesia complications

## 2014-01-20 NOTE — Anesthesia Preprocedure Evaluation (Signed)
Anesthesia Evaluation  Patient identified by MRN, date of birth, ID band Patient awake    Reviewed: Allergy & Precautions, H&P , NPO status , Patient's Chart, lab work & pertinent test results  Airway Mallampati: I TM Distance: >3 FB Neck ROM: Full    Dental  (+) Teeth Intact and Dental Advisory Given   Pulmonary  breath sounds clear to auscultation        Cardiovascular hypertension, Pt. on medications Rhythm:Regular Rate:Normal     Neuro/Psych    GI/Hepatic GERD-  Medicated and Controlled,  Endo/Other  diabetes, Well Controlled, Oral Hypoglycemic AgentsMorbid obesity  Renal/GU      Musculoskeletal   Abdominal   Peds  Hematology   Anesthesia Other Findings   Reproductive/Obstetrics                           Anesthesia Physical Anesthesia Plan  ASA: III  Anesthesia Plan: MAC   Post-op Pain Management:    Induction: Intravenous  Airway Management Planned: Simple Face Mask  Additional Equipment:   Intra-op Plan:   Post-operative Plan:   Informed Consent: I have reviewed the patients History and Physical, chart, labs and discussed the procedure including the risks, benefits and alternatives for the proposed anesthesia with the patient or authorized representative who has indicated his/her understanding and acceptance.   Dental advisory given  Plan Discussed with: CRNA, Anesthesiologist and Surgeon  Anesthesia Plan Comments:         Anesthesia Quick Evaluation

## 2014-01-20 NOTE — Op Note (Signed)
Darlene Howard 01/20/2014 Cataract: Combined, Nuclear  Procedure: Phacoemulsification, Posterior Chamber Intra-ocular Lens Operative Eye:  left eye  Surgeon: Adonis Brook Estimated Blood Loss: minimal Specimens for Pathology:  None Complications: small hole in posterior capsule  The patient was prepared and draped in the usual manner for ocular surgery on the left eye. A Cook lid speculum was placed. A peripheral clear corneal incision was made at the surgical limbus centered at the 11:00 meridian. A separate clear corneal stab incision was made with a 15 degree blade at the 2:00 meridian to permit bi-manual technique. Viscoat and  Provisc as an underlying layer next to the capsule was instilled into the anterior chamber through that incision.  A keratome was used to create a self sealing incision entering the anterior chamber at the 11:00 meridian. A capsulorhexis was performed using a bent 25g needle. The lens was hydrodissected and the nucleus was hydrodilineated using a Nichammin cannula. The Chang chopper was inserted and used to rotate the lens to insure adequate lens mobility. The phacoemulsification handpiece was inserted and a combined phaco-chop technique was employed, fracturing the lens into separate sections with subsequent removal with the phaco handpiece.  A small hole was noted in the posterior capsule. Viscoat was instilled to protect the hole from expanding.  The I/A cannula was used to remove remaining lens cortex. Provisc was instilled and used to deepen the anterior chamber and posterior capsule bag. The Monarch injector was used to place a folded Acrysof MA50BM PC IOL, + 21.50  diopters, into the sulcus. The optic was placed behind the anterior capsule, leaving the haptics in the sulcus. The McPherson forcep  was used to place the trailing haptic in the sulcus. The lens was stable and there was no vitreous in the anterior chamber.  The I/A cannula was used to remove the  viscoelastic from the anterior chamber. BSS was used to bring IOP to the desired range and the wound was checked to insure it was watertight. Subconjunctival injections of Ancef 100/0.67ml and Dexamethasone 0.5 ml of a 10mg /29ml solution were placed without complication. The lid speculum and drapes were removed and the patient's eye was patched with Polymixin/Bacitracin ophthalmic ointment. An eye shield was placed and the patient was transferred alert and conversant from the operating room to the post-operative recovery area.   Adonis Brook, MD

## 2014-01-20 NOTE — H&P (View-Only) (Signed)
Patient Record  Darlene Howard, Darlene Howard Patient Number:  [id number] Date of Birth:  [birth year] Age:  63 years old    Gender:  Female Date of Evaluation:  January 01, 2014  Chief Complaint:   The patient is referred for Cataract evaluation . She has a history of Glaucoma OU in good control. She reports not being able to read, difficulty reading road signs, not recognizing faces until people are up close, glare from head lights. History of Present Illness:   63 yo female for glaucoma follow up.  On lumigan and PF cosopt.  Diabetic.  CHO controlled.  No pain or discomfort.  No burning or itching. No pus or mucus.  C/O blurred vision.  Has cataracts.  Presents for evaluation. Past History:  Allergies:  codeine sulfate (nausea), Active Medications:   Ocular Medications:  Lumigan (bimatoprost) Drops 0.01% Apply 1 drop into both eyes every night, 2.5 ml Other Medications:  amlodipine (amlodipine) tablet 2.5 mg, Valtrex (valacyclovir) tablet 1 g, Premarin (conjugated estrogens) tablet 0.625 mg, REVLIMID (lenalidomide) capsule 15 mg, dexamethasone (dexamethasone) tablet 4 mg, Centrum Silver (multivitamin-minerals-lutein) tablet 1 tablet by mouth once a day tablet, potassium chloride (potassium chloride) tablet,ER particles/crystals 20 mEq 1 tablet by mouth twice a day tablet, Diovan HCT (valsartan-hydrochlorothiazide) tablet 320-25 mg 1 tablet by mouth once a day tablet, aspirin (aspirin) tablet,delayed release (DR/EC) 81 mg 1 tablet by mouth once a day tablet, , Calcium 600 + D(3) (calcium carbonate-vitamin d3) capsule 600 mg calcium- 200 unit, Tylenol Arthritis (acetaminophen) tablet extended release 650 mg, metformin (metformin) tablet extended release 24hr 1,000 mg 1 tablet by mouth once a day tablet, alendronate (alendronate) tablet 70 mg 1 tablet by mouth once a day tablet, Protonix (pantoprazole) Recon Soln 40 mg, loratadine (loratadine) tablet 10 mg 1 tablet by mouth once a day tablet, multivitamin  (multivitamin) capsule Birth History:  none Past Ocular History:   glaucoma  Past Medical History:   250.00 Diabetes mellitus, non-insulin dependent, high blood  pressure, Myeloma Past Surgical History:   hysterectomy  colon surgery Family History:  no amblyopia, no blindness, no cataracts, no crossed eyes, no diabetic retinopathy, + glaucoma (father), no macular degeneration, no retinal detachment, no cancer, no diabetes, no heart disease, no high blood pressure, no stroke Social History:   Smoking Status: never smoker  Alcohol:  none   Driving status:  driving Marital status:  single Review of Systems:   Constitutional:  no fever, no weight loss    Eyes: + blurred vision  Ear/Nose/Throat: + sinus problems  Cardiovascular:  no chest pain, no irregular heart beats    Respiratory:  no shortness of breath, no wheezing    Gastrointestinal:  no abdominal pain, no nausea    Genitourinary:  no blood in urine, no discomfort    Musculoskeletal:  no joint pain, no low back pain    Integumentary skin/breast:  no rashes, no skin tumors    Neurological:  no numbness, no weakness    Psychiatric:  no anxiety, no depression    Endocrine:  no heat intolerance, no thyroid problems    Hematologic/Lymphatic:  no anemia, no unusual bleeding    Allergic/Immunologic: + seasonal allergies  Examination:  Visual Acuity:   Distance VA cc:  OD: 20/200    OS: 20/200  Distance VA [place]:  OD: 20/200    OS: 20/200 IOP:  OD:  14     OS:  14    @ 02:45PM (Goldmann applanation) Manifest Refraction:  Sphere    Cyl Axis       VA         Add       VA Prism Base R:  -0.50 --1.25   75    20/50       +2.75                      L:  -2.00  -0.50  135    20/60       +2.75                       comments: 11/11/13  Confrontation visual field:  OU:  Normal  Motility:  OU:  Normal  Pupils:  OU:  Shape, size, direct and consensual reaction normal  Adnexa:  Preauricular LN, lacrimal drainage, lacrimal glands, orbit  normal  Eyelids:  Eyelids:  normal Conjunctiva:  OU:  bulbar, palpebral normal  Cornea:  OU:  epithelium, stroma, endothelium, tear film normal  Anterior Chamber:  OU:  depth normal, no cell, no flare, 3+ deep / clear  Iris:  OU:  normal Dilation:  OU: AK-Dilate @ 03:56PM  Lens:  OD:  1+ posterior subcapsular, 1+ cortical, 1+ nuclear sclerotic cataract OS:  3+ posterior subcapsular, 1+ cortical, 1+ nuclear sclerotic cataract  Vitreous:  OU:  normal  Optic Disc:  OD:  cupping: 0.6  OS:  cupping: 0.75   Macula:  OU:  normal  Vessels:  OU:  normal  Periphery:  OU:  Normal  A Scan / IOLMaster:  AScan predicts an Acrysof MA50BM + 21.50   power for emmetropia OS   Orientation to person, place and time:  Normal  Mood and affect:  Normal  Impression:  366.19  Combined Cataract OU: marked visual significance 365.11  Primary Open Angle Glaucoma OU 365.72  -Glaucoma, moderate  250.50  Diabetes, type II, with ophthalmic manifestations (not stated as controlled)  Plan/Treatment:  Cataract: We discussed the natural history of Cataracts with illustrations. We discussed the related symptoms, visual significance and when we intervene with surgery. We discussed the surgical techniques used, risks and benefits of surgery.  She indicates a desire to proceed with planned surgery for her left eye. I concur with that plan.  She indicated understanding our discussion and felt that her questions had been answered to her satisfaction.  She indicates that she desires to proceed with the recommended treatment/care plan.   Patient Instructions: Continue current management. Please do not eat anything after mignight the day before surgery. She may take morning medications with a sip of water.  Return to clinic:  01/21/2013 for post-operative follow-up  Schedule:  Phacoemulsification, Posterior Chamber Intraocular Lens Left Eye x 01/20/2014   (electronically signed)  Adonis Brook, MD

## 2014-01-20 NOTE — Interval H&P Note (Signed)
History and Physical Interval Note:  01/20/2014 9:54 AM  BP 151/73  Pulse 81  Temp(Src) 98 F (36.7 C) (Oral)  Resp 18  SpO2 100%  Darlene Howard  has presented today for surgery, with the diagnosis of Combined Cataract Left Eye  The various methods of treatment have been discussed with the patient and family. After consideration of risks, benefits and other options for treatment, the patient has consented to  Procedure(s): CATARACT EXTRACTION PHACO AND INTRAOCULAR LENS PLACEMENT ([place]) (Left) as a surgical intervention .  The patient's history has been reviewed, patient examined, no change in status, stable for surgery.  I have reviewed the patient's chart and labs.  Questions were answered to the patient's satisfaction.     Adonis Brook, MD

## 2014-01-20 NOTE — Preoperative (Signed)
Beta Blockers   Reason not to administer Beta Blockers:Not Applicable 

## 2014-01-21 ENCOUNTER — Other Ambulatory Visit: Payer: Self-pay | Admitting: *Deleted

## 2014-01-21 ENCOUNTER — Other Ambulatory Visit: Payer: Self-pay | Admitting: Family Medicine

## 2014-01-21 DIAGNOSIS — C9000 Multiple myeloma not having achieved remission: Secondary | ICD-10-CM

## 2014-01-21 MED ORDER — WARFARIN SODIUM 2 MG PO TABS: 2.0000 mg | ORAL_TABLET | Freq: Every day | ORAL | Status: DC

## 2014-01-22 ENCOUNTER — Encounter (HOSPITAL_COMMUNITY): Payer: Self-pay | Admitting: Ophthalmology

## 2014-01-28 ENCOUNTER — Other Ambulatory Visit: Payer: Self-pay | Admitting: *Deleted

## 2014-01-28 DIAGNOSIS — C9000 Multiple myeloma not having achieved remission: Secondary | ICD-10-CM

## 2014-01-28 MED ORDER — LENALIDOMIDE 15 MG PO CAPS
ORAL_CAPSULE | ORAL | Status: DC
Start: 2014-01-28 — End: 2014-02-19

## 2014-02-15 ENCOUNTER — Telehealth: Payer: Self-pay | Admitting: Internal Medicine

## 2014-02-15 ENCOUNTER — Ambulatory Visit: Payer: 59 | Admitting: Internal Medicine

## 2014-02-15 ENCOUNTER — Other Ambulatory Visit (HOSPITAL_BASED_OUTPATIENT_CLINIC_OR_DEPARTMENT_OTHER): Payer: 59

## 2014-02-15 ENCOUNTER — Ambulatory Visit (HOSPITAL_BASED_OUTPATIENT_CLINIC_OR_DEPARTMENT_OTHER): Payer: 59 | Admitting: Physician Assistant

## 2014-02-15 VITALS — BP 119/65 | HR 93 | Temp 97.0°F | Resp 18 | Ht 66.0 in | Wt 212.8 lb

## 2014-02-15 DIAGNOSIS — C9000 Multiple myeloma not having achieved remission: Secondary | ICD-10-CM

## 2014-02-15 DIAGNOSIS — R944 Abnormal results of kidney function studies: Secondary | ICD-10-CM

## 2014-02-15 LAB — COMPREHENSIVE METABOLIC PANEL (CC13)
ALBUMIN: 3.4 g/dL — AB (ref 3.5–5.0)
ALK PHOS: 65 U/L (ref 40–150)
ALT: 38 U/L (ref 0–55)
AST: 19 U/L (ref 5–34)
Anion Gap: 11 mEq/L (ref 3–11)
BUN: 12.6 mg/dL (ref 7.0–26.0)
CO2: 26 meq/L (ref 22–29)
Calcium: 10.1 mg/dL (ref 8.4–10.4)
Chloride: 105 mEq/L (ref 98–109)
Creatinine: 0.9 mg/dL (ref 0.6–1.1)
GLUCOSE: 109 mg/dL (ref 70–140)
POTASSIUM: 3.7 meq/L (ref 3.5–5.1)
Sodium: 142 mEq/L (ref 136–145)
Total Bilirubin: 1.1 mg/dL (ref 0.20–1.20)
Total Protein: 7.1 g/dL (ref 6.4–8.3)

## 2014-02-15 LAB — CBC WITH DIFFERENTIAL/PLATELET
BASO%: 0.5 % (ref 0.0–2.0)
BASOS ABS: 0 10*3/uL (ref 0.0–0.1)
EOS ABS: 0.1 10*3/uL (ref 0.0–0.5)
EOS%: 1.6 % (ref 0.0–7.0)
HCT: 34.6 % — ABNORMAL LOW (ref 34.8–46.6)
HEMOGLOBIN: 11.1 g/dL — AB (ref 11.6–15.9)
LYMPH#: 1.1 10*3/uL (ref 0.9–3.3)
LYMPH%: 12.7 % — ABNORMAL LOW (ref 14.0–49.7)
MCH: 31.4 pg (ref 25.1–34.0)
MCHC: 32.1 g/dL (ref 31.5–36.0)
MCV: 98 fL (ref 79.5–101.0)
MONO#: 0.9 10*3/uL (ref 0.1–0.9)
MONO%: 10.7 % (ref 0.0–14.0)
NEUT#: 6.5 10*3/uL (ref 1.5–6.5)
NEUT%: 74.5 % (ref 38.4–76.8)
NRBC: 2 % — AB (ref 0–0)
Platelets: 159 10*3/uL (ref 145–400)
RBC: 3.53 10*6/uL — AB (ref 3.70–5.45)
RDW: 15.3 % — AB (ref 11.2–14.5)
WBC: 8.8 10*3/uL (ref 3.9–10.3)

## 2014-02-15 LAB — LACTATE DEHYDROGENASE (CC13): LDH: 173 U/L (ref 125–245)

## 2014-02-15 NOTE — Telephone Encounter (Signed)
gv and printed appt sched anda vs for pt for March.... °

## 2014-02-16 ENCOUNTER — Encounter: Payer: Self-pay | Admitting: Physician Assistant

## 2014-02-16 NOTE — Progress Notes (Addendum)
[place] [phone]   [phone] SHARED VISIT PROGRESS NOTE  Darlene Nakayama, MD [address], [address] [address]  DIAGNOSIS: Stage IIA IgA kappa multiple myeloma diagnosed in 2006   PRIOR THERAPY:  1) status post treatment with thalidomide and Decadron followed by autologous peripheral blood stem cell transplant with high-dose melphalan on 04/26/2006 at [facility].  2) the patient had evidence for disease recurrence in August of 2008 and she was treated with 6 cycles of Velcade completed on 03/30/2008 with response to the treatment but was discontinued secondary to neuropathy.  3) maintenance treatment with Revlimid 10 mg by mouth daily by the does was later increase to 25 mg by mouth daily secondary to biochemical recurrence with stabilization of her disease.  4) Revlimid 15 mg by mouth daily for 2 weeks every 3 weeks in addition to Decadron currently at 4 mg by mouth on a weekly basis.  CURRENT THERAPY: Revlimid 15 mg by mouth daily for 2 weeks every 3 weeks in addition to Decadron currently at 20 mg by mouth on a weekly basis. Status post 1 month of therapy with the increased dose of Decadron   INTERVAL HISTORY: Darlene Howard 63 y.o. female returns to the clinic today for routine monthly followup visit. She is tolerating her treatment with maintenance Revlimid fairly well with no significant adverse effects. She has tolerated increase dexamethasone without difficulty. She had a day or two of some generalized achiness but this has resolved. The patient denied having any fever or chills. She denied having any chest pain, shortness of breath, cough or hemoptysis. She has no weight loss or night sweats. The patient is tolerating her current treatment with Revlimid and Decadron fairly well except for occasional leg cramps.   MEDICAL HISTORY: Past Medical History  Diagnosis Date  . Glaucoma     uses eye drops as instructed  .  Obesity   . Personal history of colon cancer     next   . Allergy   . Herpes     takes Valtrex as needed  . Diabetes mellitus     takes Metformin daily  . GERD (gastroesophageal reflux disease)     takes Pantoprazole daily  . Hypertension     takes Amlodipine and Diovan daily  . Multiple myeloma     and colon cancer  . History of bronchitis     Dec 2014  . Bruises easily     d/t being on COumadin  . History of colon polyps   . Anemia     hx of  . History of blood transfusion   . Cataracts, bilateral     ALLERGIES:  is allergic to codeine.  MEDICATIONS:  Current Outpatient Prescriptions  Medication Sig Dispense Refill  . alendronate (FOSAMAX) 70 MG tablet Take 70 mg by mouth once a week. Take with a full glass of water on an empty stomach.      Marland Kitchen amLODipine (NORVASC) 2.5 MG tablet Take 2.5 mg by mouth daily.      Marland Kitchen aspirin (ASPIRIN LOW DOSE) 81 MG EC tablet Take 81 mg by mouth daily.        . bimatoprost (LUMIGAN) 0.03 % ophthalmic drops Place 1 drop into both eyes at bedtime.       . calcium-vitamin D (OSCAL 500/200 D-3) 500-200 MG-UNIT per tablet Take 1 tablet by mouth 3 (three) times daily.        Marland Kitchen  dexamethasone (DECADRON) 4 MG tablet 5 tablets by mouth on a weekly basis.  40 tablet  1  . dorzolamide-timolol (COSOPT) 22.3-6.8 MG/ML ophthalmic solution Place 1 drop into both eyes 2 (two) times daily.      . Lancets (ONETOUCH ULTRASOFT) lancets TEST ONCE DAILY OR AS DIRECTED.  100 each  5  . lenalidomide (REVLIMID) 15 MG capsule Take one capsule daily for 14 days.  Then 7 days off.  14 capsule  0  . loratadine (CLARITIN) 10 MG tablet Take 10 mg by mouth daily.      . metFORMIN (GLUCOPHAGE) 1000 MG tablet Take 1,000 mg by mouth daily with breakfast.      . Multiple Vitamins-Minerals (CENTRUM SILVER PO) Take by mouth daily.        . ONE TOUCH ULTRA TEST test strip USE AS DIRECTED TO CHECK DAILY.  50 each  2  . pantoprazole (PROTONIX) 40 MG tablet Take 40 mg by mouth daily.       . potassium chloride SA (K-DUR,KLOR-CON) 20 MEQ tablet Take 20 mEq by mouth 2 (two) times daily.      . prednisoLONE acetate (PRED FORTE) 1 % ophthalmic suspension 1 drop 4 (four) times daily.      . valACYclovir (VALTREX) 1000 MG tablet TAKE ONE TABLET BY MOUTH DAILY AS NEEDED.  30 tablet  PRN  . valsartan-hydrochlorothiazide (DIOVAN-HCT) 320-25 MG per tablet Take 1 tablet by mouth daily.      Marland Kitchen warfarin (COUMADIN) 2 MG tablet Take 1 tablet (2 mg total) by mouth daily.  30 tablet  2   No current facility-administered medications for this visit.    SURGICAL HISTORY:  Past Surgical History  Procedure Laterality Date  . Bone marrow transplant  2007  . Abdominal hysterectomy    . Appendectomy    . Colonoscopy    . Laparoscopic partial right colectomy    . Cervical cone biopsy    . Cataract extraction w/phaco Left 01/20/2014    Procedure: CATARACT EXTRACTION PHACO AND INTRAOCULAR LENS PLACEMENT ([facility]);  Surgeon: Adonis Brook, MD;  Location: [place];  Service: Ophthalmology;  Laterality: Left;    REVIEW OF SYSTEMS:  Constitutional: negative Eyes: negative Ears, nose, mouth, throat, and face: negative Respiratory: negative Cardiovascular: negative Gastrointestinal: negative Genitourinary:negative Integument/breast: negative Hematologic/lymphatic: negative Musculoskeletal:negative, Occasional leg cramps Neurological: negative Behavioral/Psych: negative Endocrine: negative Allergic/Immunologic: negative   PHYSICAL EXAMINATION: General appearance: alert, cooperative and no distress Head: Normocephalic, without obvious abnormality, atraumatic Neck: no adenopathy Lymph nodes: Cervical, supraclavicular, and axillary nodes normal. Resp: clear to auscultation bilaterally Back: symmetric, no curvature. ROM normal. No CVA tenderness. Cardio: regular rate and rhythm, S1, S2 normal, no murmur, click, rub or gallop GI: soft, non-tender; bowel sounds normal; no masses,  no  organomegaly Extremities: extremities normal, atraumatic, no cyanosis or edema Neurologic: Alert and oriented X 3, normal strength and tone. Normal symmetric reflexes. Normal coordination and gait  ECOG PERFORMANCE STATUS: 1 - Symptomatic but completely ambulatory  Blood pressure 119/65, pulse 93, temperature 97 F (36.1 C), resp. rate 18, height _0  (1.676 m), weight 212 lb 12.8 oz (96.525 kg).  LABORATORY DATA: Lab Results  Component Value Date   WBC 8.8 02/15/2014   HGB 11.1* 02/15/2014   HCT 34.6* 02/15/2014   MCV 98.0 02/15/2014   PLT 159 02/15/2014      Chemistry      Component Value Date/Time   NA 142 02/15/2014 1431   NA 142 01/14/2014 1413   K 3.7  02/15/2014 1431   K 3.8 01/14/2014 1413   CL 101 01/14/2014 1413   CL 105 06/17/2013 1441   CO2 26 02/15/2014 1431   CO2 28 01/14/2014 1413   BUN 12.6 02/15/2014 1431   BUN 12 01/14/2014 1413   CREATININE 0.9 02/15/2014 1431   CREATININE 0.81 01/14/2014 1413   CREATININE 0.82 10/14/2013 1206      Component Value Date/Time   CALCIUM 10.1 02/15/2014 1431   CALCIUM 10.0 01/14/2014 1413   ALKPHOS 65 02/15/2014 1431   ALKPHOS 50 10/14/2013 1206   AST 19 02/15/2014 1431   AST 20 10/14/2013 1206   ALT 38 02/15/2014 1431   ALT 30 10/14/2013 1206   BILITOT 1.10 02/15/2014 1431   BILITOT 1.4* 10/14/2013 1206     Other lab results: IgG 487, IgA 1690, IgM 20, beta-2 microglobulin 1.62, free kappa light chain 1.17, free lambda light chain 1.21 with a kappa/lambda ratio of 0.97  RADIOGRAPHIC STUDIES: No results found.  ASSESSMENT AND PLAN: This is a very pleasant 63 years old [place] American female with history of multiple myeloma currently on treatment with Revlimid and low-dose Decadron and tolerating her treatment fairly well. Her myeloma panel is stable today except for the increased IgA. The patient was discussed with and also seen by Dr. [name]. She will continue on the increased  dose of Decadron to 20 mg on a weekly basis in addition  to her current treatment with Revlimid 15 mg by mouth daily for 3 weeks every 28 days. She'll followup one month for another symptom management visit. We will plan for the patient completed 3 months of therapy with the increased dose of dexamethasone and then we'll check another myeloma panel to reevaluate her disease. If she continues to have increase in the IgA level, Dr. [name] may consider increasing the dose of Revlimid to 25 mg or to switch her treatment to Velcade and Decadron.  Her serum creatinine has been slowly increasing and we will continue to monitor this closely. She was advised to call immediately if she has any concerning symptoms and interval.  The patient voices understanding of current disease status and treatment options and is in agreement with the current care plan.  All questions were answered. The patient knows to call the clinic with any problems, questions or concerns. We can certainly see the patient much sooner if necessary.  Carlton Adam PA-C  ADDENDUM: Hematology/Oncology Attending:  I had the face to face encounter with the patient. I recommended her care plan. This is a very pleasant 63 years old [place] American female with history of stage II a multiple myeloma currently on treatment with Revlimid and Decadron which was increased to 20 mg by mouth on a weekly basis. She is tolerating her treatment fairly well with no significant adverse effects. I recommended for the patient to continue her current treatment with Revlimid and Decadron. She would come back for followup visit in one month's for reevaluation and management any adverse effect of her treatment. She was advised to call immediately if she has any concerning symptoms in the interval.  Disclaimer: This note was dictated with voice recognition software. Similar sounding words can inadvertently be transcribed and may not be corrected upon review. Eilleen Kempf., MD 02/16/2014

## 2014-02-16 NOTE — Patient Instructions (Signed)
Continue taking the Revlimid and Decadron at your current doses Followup in one month

## 2014-02-18 LAB — HEMOGLOBIN A1C
Hgb A1c MFr Bld: 6.4 % — ABNORMAL HIGH (ref <5.7)
Mean Plasma Glucose: 137 mg/dL — ABNORMAL HIGH (ref <117)

## 2014-02-18 LAB — COMPLETE METABOLIC PANEL WITH GFR
ALT: 45 U/L — ABNORMAL HIGH (ref 0–35)
AST: 30 U/L (ref 0–37)
Albumin: 3.7 g/dL (ref 3.5–5.2)
Alkaline Phosphatase: 58 U/L (ref 39–117)
BUN: 12 mg/dL (ref 6–23)
CO2: 30 mEq/L (ref 19–32)
Calcium: 9.3 mg/dL (ref 8.4–10.5)
Chloride: 104 mEq/L (ref 96–112)
Creat: 0.82 mg/dL (ref 0.50–1.10)
GFR, Est African American: 87 mL/min
GFR, Est Non African American: 76 mL/min
Glucose, Bld: 138 mg/dL — ABNORMAL HIGH (ref 70–99)
Potassium: 3.6 mEq/L (ref 3.5–5.3)
Sodium: 142 mEq/L (ref 135–145)
Total Bilirubin: 1.2 mg/dL (ref 0.2–1.2)
Total Protein: 6.8 g/dL (ref 6.0–8.3)

## 2014-02-18 LAB — LIPID PANEL
Cholesterol: 148 mg/dL (ref 0–200)
HDL: 52 mg/dL (ref >39)
LDL Cholesterol: 69 mg/dL (ref 0–99)
Total CHOL/HDL Ratio: 2.8 Ratio
Triglycerides: 137 mg/dL (ref <150)
VLDL: 27 mg/dL (ref 0–40)

## 2014-02-18 LAB — TSH: TSH: 1.304 u[IU]/mL (ref 0.350–4.500)

## 2014-02-19 ENCOUNTER — Other Ambulatory Visit: Payer: Self-pay | Admitting: *Deleted

## 2014-02-19 DIAGNOSIS — C9000 Multiple myeloma not having achieved remission: Secondary | ICD-10-CM

## 2014-02-19 MED ORDER — LENALIDOMIDE 15 MG PO CAPS: ORAL_CAPSULE | ORAL | Status: DC

## 2014-02-22 ENCOUNTER — Encounter: Payer: Self-pay | Admitting: Family Medicine

## 2014-02-22 ENCOUNTER — Other Ambulatory Visit (HOSPITAL_COMMUNITY)
Admission: RE | Admit: 2014-02-22 | Discharge: 2014-02-22 | Disposition: A | Discharge status: Home / Self Care | Payer: 59 | Source: Ambulatory Visit | Attending: Family Medicine | Admitting: Family Medicine

## 2014-02-22 ENCOUNTER — Other Ambulatory Visit: Payer: Self-pay | Admitting: Family Medicine

## 2014-02-22 ENCOUNTER — Ambulatory Visit (INDEPENDENT_AMBULATORY_CARE_PROVIDER_SITE_OTHER): Payer: 59 | Admitting: Family Medicine

## 2014-02-22 VITALS — BP 126/70 | HR 98 | Resp 18 | Ht 66.0 in | Wt 213.1 lb

## 2014-02-22 DIAGNOSIS — IMO0002 Reserved for concepts with insufficient information to code with codable children: Secondary | ICD-10-CM

## 2014-02-22 DIAGNOSIS — Z124 Encounter for screening for malignant neoplasm of cervix: Secondary | ICD-10-CM | POA: Insufficient documentation

## 2014-02-22 DIAGNOSIS — M5410 Radiculopathy, site unspecified: Secondary | ICD-10-CM

## 2014-02-22 DIAGNOSIS — E669 Obesity, unspecified: Secondary | ICD-10-CM

## 2014-02-22 DIAGNOSIS — R945 Abnormal results of liver function studies: Secondary | ICD-10-CM

## 2014-02-22 DIAGNOSIS — M545 Low back pain, unspecified: Secondary | ICD-10-CM | POA: Insufficient documentation

## 2014-02-22 DIAGNOSIS — Z1212 Encounter for screening for malignant neoplasm of rectum: Secondary | ICD-10-CM

## 2014-02-22 DIAGNOSIS — R490 Dysphonia: Secondary | ICD-10-CM

## 2014-02-22 DIAGNOSIS — I10 Essential (primary) hypertension: Secondary | ICD-10-CM

## 2014-02-22 DIAGNOSIS — R87810 Cervical high risk human papillomavirus (HPV) DNA test positive: Secondary | ICD-10-CM | POA: Insufficient documentation

## 2014-02-22 DIAGNOSIS — C9001 Multiple myeloma in remission: Secondary | ICD-10-CM

## 2014-02-22 DIAGNOSIS — R7989 Other specified abnormal findings of blood chemistry: Secondary | ICD-10-CM

## 2014-02-22 DIAGNOSIS — E049 Nontoxic goiter, unspecified: Secondary | ICD-10-CM

## 2014-02-22 DIAGNOSIS — Z1211 Encounter for screening for malignant neoplasm of colon: Secondary | ICD-10-CM

## 2014-02-22 DIAGNOSIS — E119 Type 2 diabetes mellitus without complications: Secondary | ICD-10-CM

## 2014-02-22 DIAGNOSIS — Z1151 Encounter for screening for human papillomavirus (HPV): Secondary | ICD-10-CM | POA: Insufficient documentation

## 2014-02-22 DIAGNOSIS — Z0000 Encounter for general adult medical examination without abnormal findings: Secondary | ICD-10-CM

## 2014-02-22 LAB — POC HEMOCCULT BLD/STL (OFFICE/1-CARD/DIAGNOSTIC): FECAL OCCULT BLD: NEGATIVE

## 2014-02-22 NOTE — Patient Instructions (Signed)
F/U in 3.5 months, call if you need me before  It is important that you exercise regularly at least 30 minutes 5 times a week. If you develop chest pain, have severe difficulty breathing, or feel very tired, stop exercising immediately and seek medical attention    A healthy diet is rich in fruit, vegetables and whole grains. Poultry fish, nuts and beans are a healthy choice for protein rather then red meat. A low sodium diet and drinking 64 ounces of water daily is generally recommended. Oils and sweet should be limited. Carbohydrates especially for those who are diabetic or overweight, should be limited to 45 to 60 gram per meal. It is important to eat on a regular schedule, at least 3 times daily. Snacks should be primarily fruits, vegetables or nuts.  Adequate rest, generally 6 to 8 hours per night is important for good health.Good sleep hygiene involves setting a regular bedtime, and turning off all sound and light in your sleep environment.Limiting caffeine intake will also help with the ability to rest well  Lab work needs to be done 3 to 5 days before your follow up visit please.hepatic panel, vit D, HBA1C non fasting and urine microalb  All medications need to be brought to every visit  You re referred for [place] of your thyroid gland, for ENT evaluation, [place] of your liver and an MRI of your low back, you will be contacted with appt information

## 2014-02-23 DIAGNOSIS — Z0000 Encounter for general adult medical examination without abnormal findings: Secondary | ICD-10-CM | POA: Insufficient documentation

## 2014-02-23 NOTE — Assessment & Plan Note (Signed)
Controlled, no change in medication DASH diet and commitment to daily physical activity for a minimum of 30 minutes discussed and encouraged, as a part of hypertension management. The importance of attaining a healthy weight is also discussed.  

## 2014-02-23 NOTE — Assessment & Plan Note (Signed)
thyroid US to further eval

## 2014-02-23 NOTE — Assessment & Plan Note (Signed)
Approximately 6  Week h/o painless hoarseness refer to ENT for eval

## 2014-02-23 NOTE — Assessment & Plan Note (Signed)
Progressive left  lower extremity "weakness and fatigue" with ambulation ans well as prolonged standing for over 7 to 10 mons, needs MRI lumbar spine to furhter eval

## 2014-02-23 NOTE — Assessment & Plan Note (Signed)
Annual exam as documented. Counseling done  re healthy lifestyle involving commitment to 150 minutes exercise per week, heart healthy diet, and attaining healthy weight.The importance of adequate sleep also discussed. Regular seat belt use and safe storage  of firearms if patient has them, is also discussed. Changes in health habits are decided on by the patient with goals and time frames  set for achieving them. Immunization and cancer screening needs are specifically addressed at this visit.  

## 2014-02-23 NOTE — Progress Notes (Signed)
Subjective:    Patient ID: Darlene Howard, female    DOB: 1951-02-19, 63 y.o.   MRN: [id number]  HPI The patient is for an annual exam. Health maintenance is reviewed and updated, specifically  recommended,  screening tests and immunizations. Recent lab and radiologic data is reviewed also. She notes chronic painless hoarseness for approximately 6 weeks. C/o increasing difficulty with walking and standing for over 10 mins due to fatigue and discomfort in left leg, pain starts across low back, has been present for months    Review of Systems See HPI Denies recent fever or chills. Denies sinus pressure, nasal congestion, ear pain or sore throat. Denies chest congestion, productive cough or wheezing. Denies chest pains, palpitations and leg swelling Denies abdominal pain, nausea, vomiting,diarrhea or constipation.   Denies dysuria, frequency, hesitancy or incontinence. C/o low back pain with progressive weakness and fatigue in left leg limiting her ability to walk or stand for over 10 minutes without needing to rest. Denies calf pain Denies headaches, seizures, numbness, or tingling. Denies depression, anxiety or insomnia. Denies skin break down or rash.        Objective:   Physical Exam  BP 126/70  Pulse 98  Resp 18  Ht 5\' 6"  (1.676 m)  Wt 213 lb 1.3 oz (96.652 kg)  BMI 34.41 kg/m2  SpO2 97% Pleasant well nourished female, alert and oriented x 3, in no cardio-pulmonary distress. Afebrile. HEENT No facial trauma or asymetry. Sinuses non tender.  EOMI, PERTL, fundoscopic exam is normal, no hemorhage or exudate.  External ears normal, tympanic membranes clear. Oropharynx moist, no exudate, good dentition. Neck: supple, no adenopathy,JVD .No bruits.THyromegaly  Chest: Clear to ascultation bilaterally.No crackles or wheezes. Non tender to palpation  Breast: No asymetry,no masses. No nipple discharge , left nipple is inverted which is not new No axillary or  supraclavicular adenopathy  Cardiovascular system; Heart sounds normal,  S1 and  S2 ,no S3.  No murmur, or thrill. Apical beat not displaced Peripheral pulses normal.  Abdomen: Soft, non tender, no organomegaly or masses. No bruits. Bowel sounds normal. No guarding, tenderness or rebound.  Rectal:  No mass. Guaiac negative stool.  GU: External genitalia normal. No lesions. Vaginal canal normal.No discharge. Uterusabsent, no adnexal masses, no  adnexal tenderness.  Musculoskeletal exam: Decreased though adequate  ROM of spine, hips , shoulders and knees. No deformity ,swelling or crepitus noted. No muscle wasting or atrophy.   Neurologic: Cranial nerves 2 to 12 intact. Grade 4 power in left lower extremity, sensation normal throughout also tone. Abnormal gait , tends to favour left side with more direvct pressure on right lower extremity Skin: Intact, no ulceration, erythema , scaling or rash noted. Pigmentation normal throughout  Psych; Normal mood and affect. Judgement and concentration normal       Assessment & Plan:  Routine general medical examination at a health care facility Annual exam as documented. Counseling done  re healthy lifestyle involving commitment to 150 minutes exercise per week, heart healthy diet, and attaining healthy weight.The importance of adequate sleep also discussed. Regular seat belt use and safe storage  of firearms if patient has them, is also discussed. Changes in health habits are decided on by the patient with goals and time frames  set for achieving them. Immunization and cancer screening needs are specifically addressed at this visit.   Hoarseness, chronic Approximately 6  Week h/o painless hoarseness refer to ENT for eval  Back pain with left-sided radiculopathy Progressive left  lower extremity "weakness and fatigue" with ambulation ans well as prolonged standing for over 7 to 10 mons, needs MRI lumbar spine to furhter  eval  Goiter thyroid US to further eval   Abnormal LFTs H/o colon ca in situ and cholelithiasis which has been asymptomatic, rept abdominal US   Type 2 diabetes mellitus, controlled Controlled, no change in medication Patient advised to reduce carb and sweets, commit to regular physical activity, take meds as prescribed, test blood as directed, and attempt to lose weight, to improve blood sugar control.   HYPERTENSION Controlled, no change in medication DASH diet and commitment to daily physical activity for a minimum of 30 minutes discussed and encouraged, as a part of hypertension management. The importance of attaining a healthy weight is also discussed.

## 2014-02-23 NOTE — Assessment & Plan Note (Signed)
Controlled, no change in medication Patient advised to reduce carb and sweets, commit to regular physical activity, take meds as prescribed, test blood as directed, and attempt to lose weight, to improve blood sugar control.  

## 2014-02-23 NOTE — Assessment & Plan Note (Signed)
H/o colon ca in situ and cholelithiasis which has been asymptomatic, rept abdominal US

## 2014-03-01 ENCOUNTER — Ambulatory Visit (HOSPITAL_COMMUNITY)
Admission: RE | Admit: 2014-03-01 | Discharge: 2014-03-01 | Disposition: A | Discharge status: Home / Self Care | Payer: 59 | Source: Ambulatory Visit | Attending: Family Medicine | Admitting: Family Medicine

## 2014-03-01 DIAGNOSIS — E049 Nontoxic goiter, unspecified: Secondary | ICD-10-CM

## 2014-03-01 DIAGNOSIS — R945 Abnormal results of liver function studies: Secondary | ICD-10-CM

## 2014-03-01 DIAGNOSIS — R7989 Other specified abnormal findings of blood chemistry: Secondary | ICD-10-CM | POA: Insufficient documentation

## 2014-03-01 DIAGNOSIS — M51379 Other intervertebral disc degeneration, lumbosacral region without mention of lumbar back pain or lower extremity pain: Secondary | ICD-10-CM | POA: Insufficient documentation

## 2014-03-01 DIAGNOSIS — M5410 Radiculopathy, site unspecified: Secondary | ICD-10-CM

## 2014-03-01 DIAGNOSIS — M47817 Spondylosis without myelopathy or radiculopathy, lumbosacral region: Secondary | ICD-10-CM | POA: Insufficient documentation

## 2014-03-01 DIAGNOSIS — K8020 Calculus of gallbladder without cholecystitis without obstruction: Secondary | ICD-10-CM | POA: Insufficient documentation

## 2014-03-01 DIAGNOSIS — M5137 Other intervertebral disc degeneration, lumbosacral region: Secondary | ICD-10-CM | POA: Insufficient documentation

## 2014-03-01 DIAGNOSIS — E041 Nontoxic single thyroid nodule: Secondary | ICD-10-CM | POA: Insufficient documentation

## 2014-03-01 DIAGNOSIS — C9001 Multiple myeloma in remission: Secondary | ICD-10-CM

## 2014-03-03 ENCOUNTER — Other Ambulatory Visit: Payer: Self-pay | Admitting: Family Medicine

## 2014-03-03 DIAGNOSIS — IMO0002 Reserved for concepts with insufficient information to code with codable children: Secondary | ICD-10-CM

## 2014-03-03 DIAGNOSIS — M549 Dorsalgia, unspecified: Secondary | ICD-10-CM

## 2014-03-03 DIAGNOSIS — M539 Dorsopathy, unspecified: Secondary | ICD-10-CM

## 2014-03-04 ENCOUNTER — Other Ambulatory Visit: Payer: Self-pay | Admitting: Family Medicine

## 2014-03-12 ENCOUNTER — Other Ambulatory Visit: Payer: Self-pay | Admitting: *Deleted

## 2014-03-12 DIAGNOSIS — C9000 Multiple myeloma not having achieved remission: Secondary | ICD-10-CM

## 2014-03-12 MED ORDER — DEXAMETHASONE 4 MG PO TABS: ORAL_TABLET | ORAL | Status: DC

## 2014-03-12 MED ORDER — LENALIDOMIDE 15 MG PO CAPS: ORAL_CAPSULE | ORAL | Status: DC

## 2014-03-15 ENCOUNTER — Other Ambulatory Visit (HOSPITAL_BASED_OUTPATIENT_CLINIC_OR_DEPARTMENT_OTHER): Payer: 59

## 2014-03-15 ENCOUNTER — Ambulatory Visit (HOSPITAL_BASED_OUTPATIENT_CLINIC_OR_DEPARTMENT_OTHER): Payer: 59 | Admitting: Internal Medicine

## 2014-03-15 ENCOUNTER — Telehealth: Payer: Self-pay | Admitting: Internal Medicine

## 2014-03-15 ENCOUNTER — Encounter: Payer: Self-pay | Admitting: Internal Medicine

## 2014-03-15 VITALS — BP 152/69 | HR 88 | Temp 98.2°F | Resp 19 | Ht 66.0 in | Wt 215.2 lb

## 2014-03-15 DIAGNOSIS — C9000 Multiple myeloma not having achieved remission: Secondary | ICD-10-CM

## 2014-03-15 DIAGNOSIS — M549 Dorsalgia, unspecified: Secondary | ICD-10-CM

## 2014-03-15 DIAGNOSIS — R944 Abnormal results of kidney function studies: Secondary | ICD-10-CM

## 2014-03-15 LAB — COMPREHENSIVE METABOLIC PANEL (CC13)
ALBUMIN: 3.2 g/dL — AB (ref 3.5–5.0)
ALT: 25 U/L (ref 0–55)
AST: 17 U/L (ref 5–34)
Alkaline Phosphatase: 60 U/L (ref 40–150)
Anion Gap: 9 mEq/L (ref 3–11)
BILIRUBIN TOTAL: 1.31 mg/dL — AB (ref 0.20–1.20)
BUN: 12.9 mg/dL (ref 7.0–26.0)
CO2: 25 mEq/L (ref 22–29)
Calcium: 9.3 mg/dL (ref 8.4–10.4)
Chloride: 106 mEq/L (ref 98–109)
Creatinine: 0.8 mg/dL (ref 0.6–1.1)
Glucose: 125 mg/dl (ref 70–140)
POTASSIUM: 3.7 meq/L (ref 3.5–5.1)
Sodium: 141 mEq/L (ref 136–145)
Total Protein: 6.6 g/dL (ref 6.4–8.3)

## 2014-03-15 LAB — CBC WITH DIFFERENTIAL/PLATELET
BASO%: 0.5 % (ref 0.0–2.0)
BASOS ABS: 0 10*3/uL (ref 0.0–0.1)
EOS%: 2.4 % (ref 0.0–7.0)
Eosinophils Absolute: 0.2 10*3/uL (ref 0.0–0.5)
HCT: 31.6 % — ABNORMAL LOW (ref 34.8–46.6)
HEMOGLOBIN: 10.3 g/dL — AB (ref 11.6–15.9)
LYMPH%: 15.2 % (ref 14.0–49.7)
MCH: 32 pg (ref 25.1–34.0)
MCHC: 32.6 g/dL (ref 31.5–36.0)
MCV: 98.1 fL (ref 79.5–101.0)
MONO#: 0.8 10*3/uL (ref 0.1–0.9)
MONO%: 11.3 % (ref 0.0–14.0)
NEUT%: 70.6 % (ref 38.4–76.8)
NEUTROS ABS: 4.7 10*3/uL (ref 1.5–6.5)
NRBC: 3 % — AB (ref 0–0)
Platelets: 120 10*3/uL — ABNORMAL LOW (ref 145–400)
RBC: 3.22 10*6/uL — ABNORMAL LOW (ref 3.70–5.45)
RDW: 15.4 % — AB (ref 11.2–14.5)
WBC: 6.7 10*3/uL (ref 3.9–10.3)
lymph#: 1 10*3/uL (ref 0.9–3.3)

## 2014-03-15 NOTE — Progress Notes (Signed)
[place] Telephone:([phone]) [phone]   Fax:([phone]) [phone] OFFICE PROGRESS NOTE  Darlene Nakayama, MD [address] [place] [address]  DIAGNOSIS: Stage IIA IgA kappa multiple myeloma diagnosed in 2006   PRIOR THERAPY:  1) status post treatment with thalidomide and Decadron followed by autologous peripheral blood stem cell transplant with high-dose melphalan on 04/26/2006 at [facility].  2) the patient had evidence for disease recurrence in August of 2008 and she was treated with 6 cycles of Velcade completed on 03/30/2008 with response to the treatment but was discontinued secondary to neuropathy.  3) maintenance treatment with Revlimid 10 mg by mouth daily by the does was later increase to 25 mg by mouth daily secondary to biochemical recurrence with stabilization of her disease.   CURRENT THERAPY: Revlimid 15 mg by mouth daily for 2 weeks every 3 weeks in addition to Decadron currently at 20 mg by mouth on a weekly basis. She is expected to start the next cycle of her treatment on 11/10/2013.  INTERVAL HISTORY: Darlene Howard 63 y.o. female returns to the clinic today for routine monthly followup visit. She is tolerating her treatment with maintenance Revlimid fairly well with no significant adverse effects. The patient denied having any fever or chills. She denied having any chest pain, shortness of breath, cough or hemoptysis. She has no weight loss or night sweats. She has low back pain and she is scheduled to see a neurosurgeon tomorrow. The patient is tolerating her current treatment with Revlimid and Decadron fairly well except for occasional leg cramps.   MEDICAL HISTORY: Past Medical History  Diagnosis Date  . Glaucoma     uses eye drops as instructed  . Obesity   . Personal history of colon cancer     next   . Allergy   . Herpes     takes Valtrex as needed  . Diabetes mellitus     takes Metformin daily  . GERD (gastroesophageal  reflux disease)     takes Pantoprazole daily  . Hypertension     takes Amlodipine and Diovan daily  . Multiple myeloma     and colon cancer  . History of bronchitis     Dec 2014  . Bruises easily     d/t being on COumadin  . History of colon polyps   . Anemia     hx of  . History of blood transfusion   . Cataracts, bilateral     ALLERGIES:  is allergic to codeine.  MEDICATIONS:  Current Outpatient Prescriptions  Medication Sig Dispense Refill  . alendronate (FOSAMAX) 70 MG tablet TAKE 1 TAB EACH WEEK 30 MIN PRIOR TO BREAKFAST WITH LARGE GLASS OF WATER ON AN EMPTY STOMACH. REMAIN UPRIGHT.  4 tablet  3  . amLODipine (NORVASC) 2.5 MG tablet TAKE 1 TABLET DAILY  90 tablet  1  . aspirin (ASPIRIN LOW DOSE) 81 MG EC tablet Take 81 mg by mouth daily.        . bimatoprost (LUMIGAN) 0.03 % ophthalmic drops Place 1 drop into both eyes at bedtime.       . calcium-vitamin D (OSCAL 500/200 D-3) 500-200 MG-UNIT per tablet Take 1 tablet by mouth 3 (three) times daily.        Marland Kitchen dexamethasone (DECADRON) 4 MG tablet 5 tablets by mouth on a weekly basis.  20 tablet  0  . dorzolamide-timolol (COSOPT) 22.3-6.8 MG/ML ophthalmic solution Place 1 drop into both eyes  2 (two) times daily.      . Lancets (ONETOUCH ULTRASOFT) lancets TEST ONCE DAILY OR AS DIRECTED.  100 each  5  . lenalidomide (REVLIMID) 15 MG capsule Take one capsule daily for 14 days.  Then 7 days off.  14 capsule  0  . loratadine (CLARITIN) 10 MG tablet Take 10 mg by mouth daily.      . metFORMIN (GLUCOPHAGE) 1000 MG tablet Take 1,000 mg by mouth daily with breakfast.      . Multiple Vitamins-Minerals (CENTRUM SILVER PO) Take by mouth daily.        . ONE TOUCH ULTRA TEST test strip USE AS DIRECTED TO CHECK DAILY.  50 each  2  . pantoprazole (PROTONIX) 40 MG tablet Take 40 mg by mouth daily.      . potassium chloride SA (K-DUR,KLOR-CON) 20 MEQ tablet Take 20 mEq by mouth 2 (two) times daily.      . prednisoLONE acetate (PRED FORTE) 1 %  ophthalmic suspension 1 drop 4 (four) times daily.      . valACYclovir (VALTREX) 1000 MG tablet TAKE ONE TABLET BY MOUTH DAILY AS NEEDED.  30 tablet  PRN  . valsartan-hydrochlorothiazide (DIOVAN-HCT) 320-25 MG per tablet Take 1 tablet by mouth daily.      Marland Kitchen warfarin (COUMADIN) 2 MG tablet Take 1 tablet (2 mg total) by mouth daily.  30 tablet  2   No current facility-administered medications for this visit.    SURGICAL HISTORY:  Past Surgical History  Procedure Laterality Date  . Bone marrow transplant  2007  . Abdominal hysterectomy    . Appendectomy    . Colonoscopy    . Laparoscopic partial right colectomy    . Cervical cone biopsy    . Cataract extraction w/phaco Left 01/20/2014    Procedure: CATARACT EXTRACTION PHACO AND INTRAOCULAR LENS PLACEMENT (IOC);  Surgeon: Adonis Brook, MD;  Location: [place];  Service: Ophthalmology;  Laterality: Left;    REVIEW OF SYSTEMS:  Constitutional: negative Eyes: negative Ears, nose, mouth, throat, and face: negative Respiratory: negative Cardiovascular: negative Gastrointestinal: negative Genitourinary:negative Integument/breast: negative Hematologic/lymphatic: negative Musculoskeletal:negative, Occasional leg cramps Neurological: negative Behavioral/Psych: negative Endocrine: negative Allergic/Immunologic: negative   PHYSICAL EXAMINATION: General appearance: alert, cooperative and no distress Head: Normocephalic, without obvious abnormality, atraumatic Neck: no adenopathy Lymph nodes: Cervical, supraclavicular, and axillary nodes normal. Resp: clear to auscultation bilaterally Back: symmetric, no curvature. ROM normal. No CVA tenderness. Cardio: regular rate and rhythm, S1, S2 normal, no murmur, click, rub or gallop GI: soft, non-tender; bowel sounds normal; no masses,  no organomegaly Extremities: extremities normal, atraumatic, no cyanosis or edema Neurologic: Alert and oriented X 3, normal strength and tone. Normal symmetric  reflexes. Normal coordination and gait  ECOG PERFORMANCE STATUS: 1 - Symptomatic but completely ambulatory  Blood pressure 152/69, pulse 88, temperature 98.2 F (36.8 C), temperature source Oral, resp. rate 19, height _0  (1.676 m), weight 215 lb 3.2 oz (97.614 kg).  LABORATORY DATA: Lab Results  Component Value Date   WBC 6.7 03/15/2014   HGB 10.3* 03/15/2014   HCT 31.6* 03/15/2014   MCV 98.1 03/15/2014   PLT 120* 03/15/2014      Chemistry      Component Value Date/Time   NA 142 02/18/2014 1217   NA 142 02/15/2014 1431   K 3.6 02/18/2014 1217   K 3.7 02/15/2014 1431   CL 104 02/18/2014 1217   CL 105 06/17/2013 1441   CO2 30 02/18/2014 1217   CO2  26 02/15/2014 1431   BUN 12 02/18/2014 1217   BUN 12.6 02/15/2014 1431   CREATININE 0.82 02/18/2014 1217   CREATININE 0.9 02/15/2014 1431   CREATININE 0.81 01/14/2014 1413      Component Value Date/Time   CALCIUM 9.3 02/18/2014 1217   CALCIUM 10.1 02/15/2014 1431   ALKPHOS 58 02/18/2014 1217   ALKPHOS 65 02/15/2014 1431   AST 30 02/18/2014 1217   AST 19 02/15/2014 1431   ALT 45* 02/18/2014 1217   ALT 38 02/15/2014 1431   BILITOT 1.2 02/18/2014 1217   BILITOT 1.10 02/15/2014 1431       RADIOGRAPHIC STUDIES: No results found.  ASSESSMENT AND PLAN: This is a very pleasant 63 years old [place] American female with history of multiple myeloma currently on treatment with Revlimid and low-dose Decadron and tolerating her treatment fairly well. I recommended for the patient to continue her current treatment with Revlimid and Decadron She would come back for followup visit in one month with repeat myeloma panel. Her serum creatinine has been slowly increasing and I will continue to monitor this closely. She was advised to call immediately if she has any concerning symptoms and interval.  The patient voices understanding of current disease status and treatment options and is in agreement with the current care plan.  All questions were answered. The  patient knows to call the clinic with any problems, questions or concerns. We can certainly see the patient much sooner if necessary.  Disclaimer: This note was dictated with voice recognition software. Similar sounding words can inadvertently be transcribed and may not be corrected upon review.

## 2014-03-15 NOTE — Telephone Encounter (Signed)
Gave pt appt for lab and Md on April 2015

## 2014-03-18 ENCOUNTER — Ambulatory Visit: Payer: 59 | Attending: Gynecologic Oncology | Admitting: Gynecologic Oncology

## 2014-03-18 VITALS — BP 144/86 | HR 87 | Temp 97.6°F | Resp 16 | Wt 213.0 lb

## 2014-03-18 DIAGNOSIS — Z85038 Personal history of other malignant neoplasm of large intestine: Secondary | ICD-10-CM | POA: Insufficient documentation

## 2014-03-18 DIAGNOSIS — Z9089 Acquired absence of other organs: Secondary | ICD-10-CM | POA: Insufficient documentation

## 2014-03-18 DIAGNOSIS — R87629 Unspecified abnormal cytological findings in specimens from vagina: Secondary | ICD-10-CM

## 2014-03-18 DIAGNOSIS — E669 Obesity, unspecified: Secondary | ICD-10-CM | POA: Insufficient documentation

## 2014-03-18 DIAGNOSIS — B009 Herpesviral infection, unspecified: Secondary | ICD-10-CM | POA: Insufficient documentation

## 2014-03-18 DIAGNOSIS — Z90710 Acquired absence of both cervix and uterus: Secondary | ICD-10-CM | POA: Insufficient documentation

## 2014-03-18 DIAGNOSIS — Z79899 Other long term (current) drug therapy: Secondary | ICD-10-CM | POA: Insufficient documentation

## 2014-03-18 DIAGNOSIS — D071 Carcinoma in situ of vulva: Secondary | ICD-10-CM | POA: Insufficient documentation

## 2014-03-18 DIAGNOSIS — Z9481 Bone marrow transplant status: Secondary | ICD-10-CM | POA: Insufficient documentation

## 2014-03-18 DIAGNOSIS — E119 Type 2 diabetes mellitus without complications: Secondary | ICD-10-CM | POA: Insufficient documentation

## 2014-03-18 DIAGNOSIS — H409 Unspecified glaucoma: Secondary | ICD-10-CM | POA: Insufficient documentation

## 2014-03-18 DIAGNOSIS — N9089 Other specified noninflammatory disorders of vulva and perineum: Secondary | ICD-10-CM | POA: Insufficient documentation

## 2014-03-18 DIAGNOSIS — Z7901 Long term (current) use of anticoagulants: Secondary | ICD-10-CM | POA: Insufficient documentation

## 2014-03-18 DIAGNOSIS — C9000 Multiple myeloma not having achieved remission: Secondary | ICD-10-CM | POA: Insufficient documentation

## 2014-03-18 DIAGNOSIS — I10 Essential (primary) hypertension: Secondary | ICD-10-CM | POA: Insufficient documentation

## 2014-03-18 DIAGNOSIS — Z7982 Long term (current) use of aspirin: Secondary | ICD-10-CM | POA: Insufficient documentation

## 2014-03-18 DIAGNOSIS — K219 Gastro-esophageal reflux disease without esophagitis: Secondary | ICD-10-CM | POA: Insufficient documentation

## 2014-03-18 DIAGNOSIS — R87619 Unspecified abnormal cytological findings in specimens from cervix uteri: Secondary | ICD-10-CM | POA: Insufficient documentation

## 2014-03-18 DIAGNOSIS — Z859 Personal history of malignant neoplasm, unspecified: Secondary | ICD-10-CM | POA: Insufficient documentation

## 2014-03-18 NOTE — Progress Notes (Signed)
Consult Note: Gyn-Onc  Consult was requested by Dr. Moshe Cipro for the evaluation of Darlene Howard 63 y.o. female  CC:  Chief Complaint  Patient presents with  . Abnormal Pap Smear    Assessment/Plan:Darlene Howard  is a 63 y.o with previous history of multifocal genital dysplasia who now presents for abnormal Pap test. Evaluation that included colposcopy of the vagina and vulva is noteworthy for the presence of vaginal and vulvar dysplasia. Multiple biopsies were collected. Darlene Howard will return on April 01 2014 to discuss the results of the biopsies and formulate a treatment plan.  HPI: Darlene Howard  is a 63 y.o.  year old with IgA multiple myeloma under the care of Dr. Marko Plume. 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   In 02/15/2010 she underwent extensive CO2 laser ablation of the vulva perianal and vaginal dysplastic areas. A Pap test subsequently in June of 2000 the level returned with evidence of low-grade squamous intraepithelial lesion. Colposcopy was then performed with biopsy for September of 2011 never consistent with vain 1. Darlene Howard has not been seen since.  A Pap test was collected on 02/22/2049 was notable for atypical squamous cells of undetermined significance with high risk HPV detected. Patient denies nausea the presence of any vulvar lesion she denies pruritus or bleeding.   Current Meds:  Outpatient Encounter Prescriptions as of 03/18/2014  Medication Sig  . alendronate (FOSAMAX) 70 MG tablet TAKE 1 TAB EACH WEEK 30 MIN PRIOR TO BREAKFAST WITH LARGE GLASS OF WATER ON AN EMPTY STOMACH. REMAIN UPRIGHT.  Marland Kitchen amLODipine (NORVASC) 2.5 MG tablet TAKE 1 TABLET DAILY  . aspirin (ASPIRIN LOW DOSE) 81 MG EC tablet Take 81 mg by mouth daily.    . bimatoprost (LUMIGAN) 0.03 % ophthalmic drops Place 1 drop into both eyes at bedtime.   Marland Kitchen dexamethasone (DECADRON) 4 MG tablet 5 tablets by mouth on a weekly basis.  Marland Kitchen dorzolamide-timolol (COSOPT) 22.3-6.8 MG/ML  ophthalmic solution Place 1 drop into both eyes 2 (two) times daily.  . Lancets (ONETOUCH ULTRASOFT) lancets TEST ONCE DAILY OR AS DIRECTED.  Marland Kitchen lenalidomide (REVLIMID) 15 MG capsule Take one capsule daily for 14 days.  Then 7 days off.  . loratadine (CLARITIN) 10 MG tablet Take 10 mg by mouth daily.  . metFORMIN (GLUCOPHAGE) 1000 MG tablet Take 1,000 mg by mouth daily with breakfast.  . Multiple Vitamins-Minerals (CENTRUM SILVER PO) Take by mouth daily.    . ONE TOUCH ULTRA TEST test strip USE AS DIRECTED TO CHECK DAILY.  . pantoprazole (PROTONIX) 40 MG tablet Take 40 mg by mouth daily.  . potassium chloride SA (K-DUR,KLOR-CON) 20 MEQ tablet Take 20 mEq by mouth 2 (two) times daily.  . prednisoLONE acetate (PRED FORTE) 1 % ophthalmic suspension 1 drop 4 (four) times daily.  . valACYclovir (VALTREX) 1000 MG tablet TAKE ONE TABLET BY MOUTH DAILY AS NEEDED.  Marland Kitchen valsartan-hydrochlorothiazide (DIOVAN-HCT) 320-25 MG per tablet Take 1 tablet by mouth daily.  Marland Kitchen warfarin (COUMADIN) 2 MG tablet Take 1 tablet (2 mg total) by mouth daily.  . calcium-vitamin D (OSCAL 500/200 D-3) 500-200 MG-UNIT per tablet Take 1 tablet by mouth 3 (three) times daily.      Allergy:  Allergies  Allergen Reactions  . Codeine Nausea And Vomiting    Social Hx:   History   Social History  . Marital Status: Single    Spouse Name: N/A    Number of Children: N/A  .  Years of Education: N/A   Occupational History  . Not on file.   Social History Main Topics  . Smoking status: Never Smoker   . Smokeless tobacco: Never Used  . Alcohol Use: No  . Drug Use: No  . Sexual Activity: Not Currently    Birth Control/ Protection: Surgical   Other Topics Concern  . Not on file   Social History Narrative  . No narrative on file    Past Surgical Hx:  Past Surgical History  Procedure Laterality Date  . Bone marrow transplant  2007  . Abdominal hysterectomy    . Appendectomy    . Colonoscopy    . Laparoscopic partial  right colectomy    . Cervical cone biopsy    . Cataract extraction w/phaco Left 01/20/2014    Procedure: CATARACT EXTRACTION PHACO AND INTRAOCULAR LENS PLACEMENT (IOC);  Surgeon: Adonis Brook, MD;  Location: [place];  Service: Ophthalmology;  Laterality: Left;    Past Medical Hx:  Past Medical History  Diagnosis Date  . Glaucoma     uses eye drops as instructed  . Obesity   . Personal history of colon cancer     next   . Allergy   . Herpes     takes Valtrex as needed  . Diabetes mellitus     takes Metformin daily  . GERD (gastroesophageal reflux disease)     takes Pantoprazole daily  . Hypertension     takes Amlodipine and Diovan daily  . Multiple myeloma     and colon cancer  . History of bronchitis     Dec 2014  . Bruises easily     d/t being on COumadin  . History of colon polyps   . Anemia     hx of  . History of blood transfusion   . Cataracts, bilateral     Past Gynecological History:   No LMP recorded. Patient has had a hysterectomy.  Family Hx:  Family History  Problem Relation Age of Onset  . Kidney failure Mother   . Hypertension Mother   . Prostate cancer Father   . Hypertension Father     vascular disease   . Stroke Father   . Hypertension Sister   . Hypertension Sister   . Hypertension Brother   . Hyperlipidemia Brother     Review of Systems:  Constitutional  Feels well,  Gastro Intestinal  No nausea, vomitting, or diarrhoea. No bright red blood per rectum, no abdominal pain, change in bowel movement, or constipation.  Genito Urinary  No frequency, urgency, dysuria, no pruritis or bleeding. Musculo Skeletal  No myalgia, arthralgia, joint swelling or pain   Physical Exam: BP 144/86  Pulse 87  Temp(Src) 97.6 F (36.4 C)  Resp 16  Wt 213 lb (96.616 kg)  WD in NAD Neck  Supple NROM, without any enlargements.  Lymph Node Survey No cervical supraclavicular or inguinal adenopathy Cardiovascular  Pulse normal rate, regularity and rhythm.   Lungs  Clear to auscultation bilateraly Psychiatry  Alert and oriented normal  Speech mood and affect. Abdomen  Normoactive bowel sounds, abdomen soft, non-tender and obese.  Back No CVA tenderness Genito Urinary  Vulva/vagina: External genitalia was notable for changes consistent with VIN. A 2 x 1 cm lesion was appreciated the left  labia minora. A 1 x 1 cm perineal lesion was also appreciated. These areas were prepped anesthetized and 4 mm punch biopsies collected.  Hemostasis was obtained with application of silver nitrate and  pressure  Vagina: Ascetic acid applied to the vagina colposcopic changes were notable for the right vaginal apex with areas of superficial excoriation and white epithelium. The  area was biopsied. A second red area was appreciated at the left vaginal apex this was not biopsied. Extremities  No bilateral cyanosis, clubbing or edema.   Janie Morning, MD, PhD 03/18/2014, 2:21 PM

## 2014-03-18 NOTE — Patient Instructions (Signed)
Biopsies obtained today.  Call for any issues or concerns.  Plan to follow up on April 2.  Please call for any questions or concerns.

## 2014-03-25 ENCOUNTER — Telehealth: Payer: Self-pay | Admitting: Medical Oncology

## 2014-03-25 ENCOUNTER — Ambulatory Visit (INDEPENDENT_AMBULATORY_CARE_PROVIDER_SITE_OTHER): Payer: 59 | Admitting: Otolaryngology

## 2014-03-25 DIAGNOSIS — J381 Polyp of vocal cord and larynx: Secondary | ICD-10-CM

## 2014-03-25 DIAGNOSIS — R490 Dysphonia: Secondary | ICD-10-CM

## 2014-03-25 NOTE — Telephone Encounter (Signed)
Needs Dr [name] okay to stop coumadin for 5 days  Prior to procedure

## 2014-03-31 ENCOUNTER — Telehealth: Payer: Self-pay | Admitting: Gynecologic Oncology

## 2014-03-31 NOTE — Telephone Encounter (Signed)
GYN ONCOLOGY OFFICE VISIT    Darlene Howard 62 y.o. female  CC: VAIN III VINIII  Assessment/Plan:Darlene Howard  is a 62 y.o. with previous history of multifocal genital dysplasia who now presents for abnormal Pap test. Evaluation that included colposcopy of the vagina and vulva is noteworthy for the presence of vaginal and vulvar dysplasia. 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  Will proceed with CO2 laser/excision of vulvar lesions and CO2 laser ablation of the vaginal lesion.   HPI: Darlene Howard  is a 62 y.o.  with IgA multiple myeloma under the care of Dr. Livesay. 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   In 02/15/2010 she underwent extensive CO2 laser ablation of the vulva perianal and vaginal dysplastic areas. A Pap test subsequently in June of 2000 the level returned with evidence of low-grade squamous intraepithelial lesion. Colposcopy was then performed with biopsy for September of 2011 never consistent with vain 1. Darlene Howard has not been seen since.  A Pap test was collected on 02/22/2014 was notable for atypical squamous cells of undetermined significance with high risk HPV detected. Patient seen 02/2014 vaginal and vulvar colposcopy with biopsies were  performed.. 1. Labium, biopsy, left - HIGH GRADE SQUAMOUS INTRAEPITHELIAL LESION, VIN-III, EXTENDING TO THE EDGE OF THE BIOPSY.  2. Vulva, biopsy, posterior fourchette - HIGH GRADE SQUAMOUS INTRAEPITHELIAL LESION, VIN-III, EXTENDING TO THE EDGE OF THE BIOPSY. - NO INVASIVE CARCINOMA. 3. Vagina, biopsy, right lateral apex - HIGH GRADE SQUAMOUS INTRAEPITHELIAL LESION, VIN-III, EXTENDING TO THE EDGE OF THE BIOPSY.   Past Surgical Hx:  Past Surgical History  Procedure Laterality Date  . Bone marrow transplant  2007  . Abdominal hysterectomy    . Appendectomy    . Colonoscopy    . Laparoscopic partial right colectomy    . Cervical cone biopsy    . Cataract extraction w/phaco  Left 01/20/2014    Procedure: CATARACT EXTRACTION PHACO AND INTRAOCULAR LENS PLACEMENT (IOC);  Surgeon: Greer Geiger, MD;  Location: [facility];  Service: Ophthalmology;  Laterality: Left;    Past Medical Hx:  Past Medical History  Diagnosis Date  . Glaucoma     uses eye drops as instructed  . Obesity   . Personal history of colon cancer     next   . Allergy   . Herpes     takes Valtrex as needed  . Diabetes mellitus     takes Metformin daily  . GERD (gastroesophageal reflux disease)     takes Pantoprazole daily  . Hypertension     takes Amlodipine and Diovan daily  . Multiple myeloma     and colon cancer  . History of bronchitis     Dec 2014  . Bruises easily     d/t being on COumadin  . History of colon polyps   . Anemia     hx of  . History of blood transfusion   . Cataracts, bilateral     Past Gynecological History:   No LMP recorded. Patient has had a hysterectomy.  Family Hx:  Family History  Problem Relation Age of Onset  . Kidney failure Mother   . Hypertension Mother   . Prostate cancer Father   . Hypertension Father     vascular disease   . Stroke Father   . Hypertension Sister   . Hypertension Sister   . Hypertension Brother   . Hyperlipidemia Brother       Review of Systems:  Constitutional  Feels well,  Gastro Intestinal  No nausea, vomitting, or diarrhoea. No bright red blood per rectum, no abdominal pain, change in bowel movement, or constipation.  Genito Urinary  No frequency, urgency, dysuria, no pruritis or bleeding. Musculo Skeletal  No myalgia, arthralgia, joint swelling or pain   Physical Exam: BP 144/86  Pulse 87  Temp(Src) 97.6 F (36.4 C)  Resp 16  Wt 213 lb (96.616 kg)  WD in NAD Neck  Supple NROM, without any enlargements.  Lymph Node Survey No cervical supraclavicular or inguinal adenopathy Cardiovascular  Pulse normal rate, regularity and rhythm.  Lungs  Clear to auscultation bilateraly Psychiatry  Alert and oriented  normal  Speech mood and affect. Abdomen  Normoactive bowel sounds, abdomen soft, non-tender and obese.  Back No CVA tenderness Genito Urinary  Vulva/vagina: External genitalia was notable for changes consistent with VIN. A 2 x 1 cm lesion was appreciated the left  labia minora. A 1 x 1 cm perineal lesion was also appreciated. These areas were prepped anesthetized and 4 mm punch biopsies collected.  Hemostasis was obtained with application of silver nitrate and pressure  Vagina: Ascetic acid applied to the vagina colposcopic changes were notable for the right vaginal apex with areas of superficial excoriation and white epithelium. The  area was biopsied. A second red area was appreciated at the left vaginal apex this was not biopsied. Extremities  No bilateral cyanosis, clubbing or edema.  

## 2014-04-01 ENCOUNTER — Ambulatory Visit: Payer: 59 | Attending: Gynecologic Oncology | Admitting: Gynecologic Oncology

## 2014-04-01 ENCOUNTER — Encounter: Payer: Self-pay | Admitting: Gynecologic Oncology

## 2014-04-01 VITALS — BP 144/71 | HR 96 | Temp 98.0°F | Resp 16 | Wt 212.1 lb

## 2014-04-01 DIAGNOSIS — Z85038 Personal history of other malignant neoplasm of large intestine: Secondary | ICD-10-CM | POA: Insufficient documentation

## 2014-04-01 DIAGNOSIS — I10 Essential (primary) hypertension: Secondary | ICD-10-CM | POA: Insufficient documentation

## 2014-04-01 DIAGNOSIS — K219 Gastro-esophageal reflux disease without esophagitis: Secondary | ICD-10-CM | POA: Insufficient documentation

## 2014-04-01 DIAGNOSIS — D072 Carcinoma in situ of vagina: Secondary | ICD-10-CM | POA: Insufficient documentation

## 2014-04-01 DIAGNOSIS — E119 Type 2 diabetes mellitus without complications: Secondary | ICD-10-CM | POA: Insufficient documentation

## 2014-04-01 DIAGNOSIS — H409 Unspecified glaucoma: Secondary | ICD-10-CM | POA: Insufficient documentation

## 2014-04-01 DIAGNOSIS — J383 Other diseases of vocal cords: Secondary | ICD-10-CM | POA: Insufficient documentation

## 2014-04-01 DIAGNOSIS — C9000 Multiple myeloma not having achieved remission: Secondary | ICD-10-CM | POA: Insufficient documentation

## 2014-04-01 DIAGNOSIS — E669 Obesity, unspecified: Secondary | ICD-10-CM | POA: Insufficient documentation

## 2014-04-01 DIAGNOSIS — R87629 Unspecified abnormal cytological findings in specimens from vagina: Secondary | ICD-10-CM

## 2014-04-01 DIAGNOSIS — D071 Carcinoma in situ of vulva: Secondary | ICD-10-CM | POA: Insufficient documentation

## 2014-04-01 DIAGNOSIS — Z90710 Acquired absence of both cervix and uterus: Secondary | ICD-10-CM | POA: Insufficient documentation

## 2014-04-01 NOTE — Progress Notes (Signed)
GYN ONCOLOGY OFFICE VISIT    Darlene Howard 63 y.o. female  CC: VAIN III VINIII  Assessment/Plan:Darlene Howard  is a 63 y.o. with previous history of multifocal genital dysplasia who now presents for abnormal Pap test. Evaluation that included colposcopy of the vagina and vulva is noteworthy for the presence of vaginal and vulvar dysplasia. 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  Will proceed with CO2 laser/excision of vulvar lesions and CO2 laser ablation of the vaginal lesion 05/06/2014 Patient reports vocal cord nodule and has an appointment with Darlene Howard.  We will seek his advice regarding timing of this minor procedure. Patient on low dose coumadin.  Will request strategy for discontinuation from Darlene Howard   HPI: Darlene Howard  is a 63 y.o.  with IgA multiple myeloma under the care of Darlene Howard. 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   In 02/15/2010 she underwent extensive CO2 laser ablation of the vulva perianal and vaginal dysplastic areas. A Pap test subsequently in June of 2000 the level returned with evidence of low-grade squamous intraepithelial lesion. Colposcopy was then performed with biopsy for September of 2011 never consistent with vain 1. Darlene Howard has not been seen since.  A Pap test was collected on 02/22/2014 was notable for atypical squamous cells of undetermined significance with high risk HPV detected. Patient seen 03/18/2014 vaginal and vulvar colposcopy with biopsies were  performed.. 1. Labium, biopsy, left - HIGH GRADE SQUAMOUS INTRAEPITHELIAL LESION, VIN-III, EXTENDING TO THE EDGE OF THE BIOPSY.  2. Vulva, biopsy, posterior fourchette - HIGH GRADE SQUAMOUS INTRAEPITHELIAL LESION, VIN-III, EXTENDING TO THE EDGE OF THE BIOPSY. - NO INVASIVE CARCINOMA. 3. Vagina, biopsy, right lateral apex - HIGH GRADE SQUAMOUS INTRAEPITHELIAL LESION, VAIN-III, EXTENDING TO THE EDGE OF THE BIOPSY.   Past Surgical Hx:   Past Surgical History  Procedure Laterality Date  . Bone marrow transplant  2007  . Abdominal hysterectomy    . Appendectomy    . Colonoscopy    . Laparoscopic partial right colectomy    . Cervical cone biopsy    . Cataract extraction w/phaco Left 01/20/2014    Procedure: CATARACT EXTRACTION PHACO AND INTRAOCULAR LENS PLACEMENT (IOC);  Surgeon: Darlene Brook, MD;  Location: [facility];  Service: Ophthalmology;  Laterality: Left;    Past Medical Hx:  Past Medical History  Diagnosis Date  . Glaucoma     uses eye drops as instructed  . Obesity   . Personal history of colon cancer     next   . Allergy   . Herpes     takes Valtrex as needed  . Diabetes mellitus     takes Metformin daily  . GERD (gastroesophageal reflux disease)     takes Pantoprazole daily  . Hypertension     takes Amlodipine and Diovan daily  . Multiple myeloma     and colon cancer  . History of bronchitis     Dec 2014  . Bruises easily     d/t being on COumadin  . History of colon polyps   . Anemia     hx of  . History of blood transfusion   . Cataracts, bilateral     Past Gynecological History:   No LMP recorded. Patient has had a hysterectomy.  Family Hx:  Family History  Problem Relation Age of Onset  . Kidney failure Mother   . Hypertension Mother   . Prostate  cancer Father   . Hypertension Father     vascular disease   . Stroke Father   . Hypertension Sister   . Hypertension Sister   . Hypertension Brother   . Hyperlipidemia Brother     Review of Systems:  Constitutional  Feels well, voice is hoarse Gastro Intestinal  No nausea, vomitting, or diarrhoea. No bright red blood per rectum, no abdominal pain, change in bowel movement, or constipation.  Genito Urinary  No frequency, urgency, dysuria, no pruritis or bleeding. Musculo Skeletal  No myalgia, arthralgia, joint swelling or pain   Physical Exam: BP 144/71  Pulse 96  Temp(Src) 98 F (36.7 C) (Oral)  Resp 16  Wt 212 lb 1.6 oz  (96.208 kg)  WD in NAD Neck  Supple NROM, without any enlargements. Voice is raspy Lymph Node Survey No cervical supraclavicular or inguinal adenopathy Cardiovascular  Pulse normal rate, regularity and rhythm.  Lungs  Clear to auscultation bilateraly Psychiatry  Alert and oriented normal  Speech mood and affect. Abdomen  Normoactive bowel sounds, abdomen soft, non-tender and obese.  Back No CVA tenderness Genito Urinary  03/25/2014 Vulva/vagina: External genitalia was notable for changes consistent with VIN. A 2 x 1 cm lesion was appreciated the left  labia minora. A 1 x 1 cm perineal lesion was also appreciated Vagina: Ascetic acid applied to the vagina colposcopic changes were notable for the right vaginal apex with areas of superficial excoriation and white epithelium.  A second red area was appreciated at the left vaginal apex . Extremities  No bilateral cyanosis, clubbing or edema.

## 2014-04-01 NOTE — Patient Instructions (Addendum)
Plan for laser of the vagina and possibly the vulva as well as wide local excision of the vulva with Dr. Janie Morning on May 7.  You will receive a phone call from [facility] closer to your surgery.  Please call for any questions or concerns.  We will obtain recommendations from Dr. Inda Merlin about stopping your Coumadin prior to your procedure.

## 2014-04-02 ENCOUNTER — Other Ambulatory Visit: Payer: Self-pay | Admitting: *Deleted

## 2014-04-02 ENCOUNTER — Telehealth: Payer: Self-pay | Admitting: *Deleted

## 2014-04-02 DIAGNOSIS — C9000 Multiple myeloma not having achieved remission: Secondary | ICD-10-CM

## 2014-04-02 MED ORDER — LENALIDOMIDE 15 MG PO CAPS: ORAL_CAPSULE | ORAL | Status: DC

## 2014-04-02 NOTE — Telephone Encounter (Signed)
Called pt to discuss stopping coumadin on may 2 prior to surgery on may 7 resume coumadin on may 10. Per Dr. [name] who manages pt's prophylactic dosing. Unable to leave message  Will attempt to call again.

## 2014-04-05 ENCOUNTER — Telehealth: Payer: Self-pay | Admitting: *Deleted

## 2014-04-05 NOTE — Telephone Encounter (Signed)
Copy of in basket message from Dr. Benjamine Mola to Dr. Skeet Latch stating " LMA should not interfere with the vocal cords, do not see any problems with CO2 procedure, pt to follow up if her hoarseness persists or worsens."

## 2014-04-05 NOTE — Telephone Encounter (Signed)
Pt called, we discussed pt to stop coumadin may 2nd resume on May 10th or per Md's protocol. Pt advised she will see Dr. Benjamine Mola on April 23rd for evaluation of her throat/vocal chords. Called Dr. Deeann Saint office [phone] spoke to Timea gave message- pt having surgery 5/7, and pt will need medical clearance regarding her Throat/vocal Chords.

## 2014-04-08 ENCOUNTER — Other Ambulatory Visit: Payer: Self-pay | Admitting: *Deleted

## 2014-04-08 DIAGNOSIS — C9000 Multiple myeloma not having achieved remission: Secondary | ICD-10-CM

## 2014-04-08 MED ORDER — DEXAMETHASONE 4 MG PO TABS: ORAL_TABLET | ORAL | Status: DC

## 2014-04-12 ENCOUNTER — Other Ambulatory Visit (HOSPITAL_BASED_OUTPATIENT_CLINIC_OR_DEPARTMENT_OTHER): Payer: 59

## 2014-04-12 DIAGNOSIS — C9000 Multiple myeloma not having achieved remission: Secondary | ICD-10-CM

## 2014-04-12 LAB — COMPREHENSIVE METABOLIC PANEL (CC13)
ALBUMIN: 3.2 g/dL — AB (ref 3.5–5.0)
ALT: 33 U/L (ref 0–55)
ANION GAP: 9 meq/L (ref 3–11)
AST: 26 U/L (ref 5–34)
Alkaline Phosphatase: 66 U/L (ref 40–150)
BILIRUBIN TOTAL: 1.15 mg/dL (ref 0.20–1.20)
BUN: 10.9 mg/dL (ref 7.0–26.0)
CO2: 26 meq/L (ref 22–29)
Calcium: 9.5 mg/dL (ref 8.4–10.4)
Chloride: 104 mEq/L (ref 98–109)
Creatinine: 0.8 mg/dL (ref 0.6–1.1)
GLUCOSE: 149 mg/dL — AB (ref 70–140)
Potassium: 3.5 mEq/L (ref 3.5–5.1)
Sodium: 139 mEq/L (ref 136–145)
Total Protein: 6.9 g/dL (ref 6.4–8.3)

## 2014-04-12 LAB — CBC WITH DIFFERENTIAL/PLATELET
BASO%: 0.6 % (ref 0.0–2.0)
Basophils Absolute: 0 10*3/uL (ref 0.0–0.1)
EOS ABS: 0.1 10*3/uL (ref 0.0–0.5)
EOS%: 1.9 % (ref 0.0–7.0)
HEMATOCRIT: 33.2 % — AB (ref 34.8–46.6)
HGB: 10.6 g/dL — ABNORMAL LOW (ref 11.6–15.9)
LYMPH%: 14.6 % (ref 14.0–49.7)
MCH: 31.5 pg (ref 25.1–34.0)
MCHC: 31.9 g/dL (ref 31.5–36.0)
MCV: 98.5 fL (ref 79.5–101.0)
MONO#: 0.2 10*3/uL (ref 0.1–0.9)
MONO%: 4.5 % (ref 0.0–14.0)
NEUT%: 78.4 % — ABNORMAL HIGH (ref 38.4–76.8)
NEUTROS ABS: 4.2 10*3/uL (ref 1.5–6.5)
PLATELETS: 153 10*3/uL (ref 145–400)
RBC: 3.37 10*6/uL — ABNORMAL LOW (ref 3.70–5.45)
RDW: 15.1 % — ABNORMAL HIGH (ref 11.2–14.5)
WBC: 5.3 10*3/uL (ref 3.9–10.3)
lymph#: 0.8 10*3/uL — ABNORMAL LOW (ref 0.9–3.3)

## 2014-04-12 LAB — LACTATE DEHYDROGENASE (CC13): LDH: 167 U/L (ref 125–245)

## 2014-04-13 LAB — IGG, IGA, IGM
IGM, SERUM: 12 mg/dL — AB (ref 52–322)
IgA: 1400 mg/dL — ABNORMAL HIGH (ref 69–380)
IgG (Immunoglobin G), Serum: 355 mg/dL — ABNORMAL LOW (ref 690–1700)

## 2014-04-14 ENCOUNTER — Other Ambulatory Visit: Payer: Self-pay | Admitting: Family Medicine

## 2014-04-14 LAB — BETA 2 MICROGLOBULIN, SERUM: Beta-2 Microglobulin: 2.87 mg/L — ABNORMAL HIGH (ref <=2.51)

## 2014-04-14 LAB — KAPPA/LAMBDA LIGHT CHAINS
Kappa free light chain: 0.97 mg/dL (ref 0.33–1.94)
Kappa:Lambda Ratio: 1.39 (ref 0.26–1.65)
Lambda Free Lght Chn: 0.7 mg/dL (ref 0.57–2.63)

## 2014-04-19 ENCOUNTER — Telehealth: Payer: Self-pay | Admitting: Internal Medicine

## 2014-04-19 ENCOUNTER — Ambulatory Visit (HOSPITAL_BASED_OUTPATIENT_CLINIC_OR_DEPARTMENT_OTHER): Payer: 59 | Admitting: Internal Medicine

## 2014-04-19 ENCOUNTER — Encounter: Payer: Self-pay | Admitting: Internal Medicine

## 2014-04-19 VITALS — BP 136/58 | HR 86 | Temp 98.0°F | Resp 20 | Ht 66.0 in | Wt 212.0 lb

## 2014-04-19 DIAGNOSIS — C9000 Multiple myeloma not having achieved remission: Secondary | ICD-10-CM

## 2014-04-19 NOTE — Progress Notes (Signed)
[facility] Telephone:([phone]) [phone]   Fax:([phone] OFFICE PROGRESS NOTE  [name], MD [address] [place] [address]  DIAGNOSIS: Stage IIA IgA kappa multiple myeloma diagnosed in 2006   PRIOR THERAPY:  1) status post treatment with thalidomide and Decadron followed by autologous peripheral blood stem cell transplant with high-dose melphalan on 04/26/2006 at [facility].  2) the patient had evidence for disease recurrence in August of 2008 and she was treated with 6 cycles of Velcade completed on 03/30/2008 with response to the treatment but was discontinued secondary to neuropathy.  3) maintenance treatment with Revlimid 10 mg by mouth daily by the does was later increase to 25 mg by mouth daily secondary to biochemical recurrence with stabilization of her disease.   CURRENT THERAPY: Revlimid 15 mg by mouth daily for 2 weeks every 3 weeks in addition to Decadron currently at 20 mg by mouth on a weekly basis. She is expected to start the next cycle of her treatment on 11/10/2013.  INTERVAL HISTORY: Darlene Howard 63 y.o. female returns to the clinic today for routine monthly followup visit. She is tolerating her treatment with maintenance Revlimid fairly well with no significant adverse effects. The patient denied having any fever or chills. She denied having any chest pain, shortness of breath, cough or hemoptysis. She has no weight loss or night sweats. She had a steroid injection for her low back pain. The patient is tolerating her current treatment with Revlimid and Decadron fairly well. She had repeat myeloma panel performed recently and she is here for evaluation and discussion of her lab results.  MEDICAL HISTORY: Past Medical History  Diagnosis Date  . Glaucoma     uses eye drops as instructed  . Obesity   . Personal history of colon cancer     next   . Allergy   . Herpes     takes Valtrex as needed  . Diabetes mellitus     takes Metformin daily  . GERD (gastroesophageal reflux disease)     takes Pantoprazole daily  . Hypertension     takes Amlodipine and Diovan daily  . Multiple myeloma     and colon cancer  . History of bronchitis     Dec 2014  . Bruises easily     d/t being on COumadin  . History of colon polyps   . Anemia     hx of  . History of blood transfusion   . Cataracts, bilateral     ALLERGIES:  is allergic to codeine.  MEDICATIONS:  Current Outpatient Prescriptions  Medication Sig Dispense Refill  . alendronate (FOSAMAX) 70 MG tablet TAKE 1 TAB EACH WEEK 30 MIN PRIOR TO BREAKFAST WITH LARGE GLASS OF WATER ON AN EMPTY STOMACH. REMAIN UPRIGHT.  4 tablet  3  . amLODipine (NORVASC) 2.5 MG tablet TAKE 1 TABLET DAILY  90 tablet  1  . aspirin (ASPIRIN LOW DOSE) 81 MG EC tablet Take 81 mg by mouth daily.        . bimatoprost (LUMIGAN) 0.03 % ophthalmic drops Place 1 drop into both eyes at bedtime.       . calcium-vitamin D (OSCAL 500/200 D-3) 500-200 MG-UNIT per tablet Take 1 tablet by mouth 3 (three) times daily.        Marland Kitchen dexamethasone (DECADRON) 4 MG tablet 5 tablets by mouth on a weekly basis.  20 tablet  0  . dorzolamide-timolol (COSOPT) 22.3-6.8 MG/ML  ophthalmic solution Place 1 drop into both eyes 2 (two) times daily.      . Lancets (ONETOUCH ULTRASOFT) lancets TEST ONCE DAILY OR AS DIRECTED.  100 each  5  . lenalidomide (REVLIMID) 15 MG capsule Take one capsule daily for 14 days.  Then 7 days off.  14 capsule  0  . loratadine (CLARITIN) 10 MG tablet Take 10 mg by mouth daily.      . metFORMIN (GLUCOPHAGE) 1000 MG tablet Take 1,000 mg by mouth daily with breakfast.      . Multiple Vitamins-Minerals (CENTRUM SILVER PO) Take by mouth daily.        . ONE TOUCH ULTRA TEST test strip USE AS DIRECTED TO CHECK DAILY.  50 each  0  . pantoprazole (PROTONIX) 40 MG tablet Take 40 mg by mouth daily.      . potassium chloride SA (K-DUR,KLOR-CON) 20 MEQ tablet Take 20 mEq by mouth 2 (two) times daily.       . valACYclovir (VALTREX) 1000 MG tablet TAKE ONE TABLET BY MOUTH DAILY AS NEEDED.  30 tablet  PRN  . valsartan-hydrochlorothiazide (DIOVAN-HCT) 320-25 MG per tablet Take 1 tablet by mouth daily.      Marland Kitchen warfarin (COUMADIN) 2 MG tablet Take 1 tablet (2 mg total) by mouth daily.  30 tablet  2   No current facility-administered medications for this visit.    SURGICAL HISTORY:  Past Surgical History  Procedure Laterality Date  . Bone marrow transplant  2007  . Abdominal hysterectomy    . Appendectomy    . Colonoscopy    . Laparoscopic partial right colectomy    . Cervical cone biopsy    . Cataract extraction w/phaco Left 01/20/2014    Procedure: CATARACT EXTRACTION PHACO AND INTRAOCULAR LENS PLACEMENT (IOC);  Surgeon: Adonis Brook, MD;  Location: [facility];  Service: Ophthalmology;  Laterality: Left;    REVIEW OF SYSTEMS:  Constitutional: negative Eyes: negative Ears, nose, mouth, throat, and face: negative Respiratory: negative Cardiovascular: negative Gastrointestinal: negative Genitourinary:negative Integument/breast: negative Hematologic/lymphatic: negative Musculoskeletal:negative, Occasional leg cramps Neurological: negative Behavioral/Psych: negative Endocrine: negative Allergic/Immunologic: negative   PHYSICAL EXAMINATION: General appearance: alert, cooperative and no distress Head: Normocephalic, without obvious abnormality, atraumatic Neck: no adenopathy Lymph nodes: Cervical, supraclavicular, and axillary nodes normal. Resp: clear to auscultation bilaterally Back: symmetric, no curvature. ROM normal. No CVA tenderness. Cardio: regular rate and rhythm, S1, S2 normal, no murmur, click, rub or gallop GI: soft, non-tender; bowel sounds normal; no masses,  no organomegaly Extremities: extremities normal, atraumatic, no cyanosis or edema Neurologic: Alert and oriented X 3, normal strength and tone. Normal symmetric reflexes. Normal coordination and gait  ECOG  PERFORMANCE STATUS: 1 - Symptomatic but completely ambulatory  Blood pressure 136/58, pulse 86, temperature 98 F (36.7 C), temperature source Oral, resp. rate 20, height '5\' 6"'  (1.676 m), weight 212 lb (96.163 kg).  LABORATORY DATA: Lab Results  Component Value Date   WBC 5.3 04/12/2014   HGB 10.6* 04/12/2014   HCT 33.2* 04/12/2014   MCV 98.5 04/12/2014   PLT 153 04/12/2014      Chemistry      Component Value Date/Time   NA 139 04/12/2014 1427   NA 142 02/18/2014 1217   K 3.5 04/12/2014 1427   K 3.6 02/18/2014 1217   CL 104 02/18/2014 1217   CL 105 06/17/2013 1441   CO2 26 04/12/2014 1427   CO2 30 02/18/2014 1217   BUN 10.9 04/12/2014 1427   BUN 12 02/18/2014  1217   CREATININE 0.8 04/12/2014 1427   CREATININE 0.82 02/18/2014 1217   CREATININE 0.81 01/14/2014 1413      Component Value Date/Time   CALCIUM 9.5 04/12/2014 1427   CALCIUM 9.3 02/18/2014 1217   ALKPHOS 66 04/12/2014 1427   ALKPHOS 58 02/18/2014 1217   AST 26 04/12/2014 1427   AST 30 02/18/2014 1217   ALT 33 04/12/2014 1427   ALT 45* 02/18/2014 1217   BILITOT 1.15 04/12/2014 1427   BILITOT 1.2 02/18/2014 1217       RADIOGRAPHIC STUDIES: No results found.  ASSESSMENT AND PLAN: This is a very pleasant 63 years old [place] American female with history of multiple myeloma currently on treatment with Revlimid and low-dose Decadron and tolerating her treatment fairly well. Her myeloma panel showed stable disease. I discussed the lab result with the patient today. I recommended for the patient to continue her current treatment with Revlimid and Decadron She would come back for followup visit in one month with repeat CBC and comprehensive metabolic panel. His serum bilirubin is better today. She was advised to call immediately if she has any concerning symptoms and interval.  The patient voices understanding of current disease status and treatment options and is in agreement with the current care plan.  All questions were answered.  The patient knows to call the clinic with any problems, questions or concerns. We can certainly see the patient much sooner if necessary.  Disclaimer: This note was dictated with voice recognition software. Similar sounding words can inadvertently be transcribed and may not be corrected upon review.

## 2014-04-19 NOTE — Telephone Encounter (Signed)
gva and printed appt sched nad avs for opt for May

## 2014-04-22 ENCOUNTER — Ambulatory Visit (INDEPENDENT_AMBULATORY_CARE_PROVIDER_SITE_OTHER): Payer: 59 | Admitting: Otolaryngology

## 2014-04-22 ENCOUNTER — Other Ambulatory Visit: Payer: Self-pay | Admitting: *Deleted

## 2014-04-22 DIAGNOSIS — R490 Dysphonia: Secondary | ICD-10-CM

## 2014-04-22 DIAGNOSIS — J381 Polyp of vocal cord and larynx: Secondary | ICD-10-CM

## 2014-04-22 DIAGNOSIS — C9000 Multiple myeloma not having achieved remission: Secondary | ICD-10-CM

## 2014-04-22 MED ORDER — LENALIDOMIDE 15 MG PO CAPS: ORAL_CAPSULE | ORAL | Status: DC

## 2014-04-22 NOTE — Telephone Encounter (Signed)
Called and left a message with patient to complete survey with [employer].  SLJ

## 2014-04-30 ENCOUNTER — Other Ambulatory Visit: Payer: Self-pay | Admitting: *Deleted

## 2014-04-30 ENCOUNTER — Other Ambulatory Visit: Payer: Self-pay | Admitting: Family Medicine

## 2014-04-30 DIAGNOSIS — C9000 Multiple myeloma not having achieved remission: Secondary | ICD-10-CM

## 2014-04-30 MED ORDER — WARFARIN SODIUM 2 MG PO TABS: 2.0000 mg | ORAL_TABLET | Freq: Every day | ORAL | Status: DC

## 2014-05-03 ENCOUNTER — Encounter (HOSPITAL_BASED_OUTPATIENT_CLINIC_OR_DEPARTMENT_OTHER): Payer: Self-pay | Admitting: *Deleted

## 2014-05-03 ENCOUNTER — Telehealth: Payer: Self-pay | Admitting: *Deleted

## 2014-05-03 NOTE — Progress Notes (Signed)
NPO AFTER MN. ARRIVE AT 0600.  NEEDS PT/INR.  OTHER LAB RESULTS, CXR AND EKG CURRENT IN EPIC AND CHART.  WILL TAKE NORVASC AND PROTONIX AM DOS W/ SIPS OF WATER.

## 2014-05-03 NOTE — Telephone Encounter (Signed)
Called pt regarding surgery May 7 with D. Brewster. Pt stopped her coumadin on May 2(saturday).

## 2014-05-06 ENCOUNTER — Ambulatory Visit (HOSPITAL_BASED_OUTPATIENT_CLINIC_OR_DEPARTMENT_OTHER): Payer: 59 | Admitting: Anesthesiology

## 2014-05-06 ENCOUNTER — Encounter (HOSPITAL_BASED_OUTPATIENT_CLINIC_OR_DEPARTMENT_OTHER): Payer: 59 | Admitting: Anesthesiology

## 2014-05-06 ENCOUNTER — Encounter (HOSPITAL_BASED_OUTPATIENT_CLINIC_OR_DEPARTMENT_OTHER): Payer: Self-pay | Admitting: *Deleted

## 2014-05-06 ENCOUNTER — Ambulatory Visit (HOSPITAL_BASED_OUTPATIENT_CLINIC_OR_DEPARTMENT_OTHER)
Admission: RE | Admit: 2014-05-06 | Discharge: 2014-05-06 | Disposition: A | Discharge status: Home / Self Care | Payer: 59 | Source: Ambulatory Visit | Attending: Gynecologic Oncology | Admitting: Gynecologic Oncology

## 2014-05-06 ENCOUNTER — Encounter (HOSPITAL_BASED_OUTPATIENT_CLINIC_OR_DEPARTMENT_OTHER)
Admission: RE | Disposition: A | Discharge status: Home / Self Care | Payer: Self-pay | Source: Ambulatory Visit | Attending: Gynecologic Oncology

## 2014-05-06 DIAGNOSIS — I10 Essential (primary) hypertension: Secondary | ICD-10-CM | POA: Insufficient documentation

## 2014-05-06 DIAGNOSIS — D072 Carcinoma in situ of vagina: Secondary | ICD-10-CM | POA: Insufficient documentation

## 2014-05-06 DIAGNOSIS — D071 Carcinoma in situ of vulva: Secondary | ICD-10-CM | POA: Insufficient documentation

## 2014-05-06 DIAGNOSIS — C9000 Multiple myeloma not having achieved remission: Secondary | ICD-10-CM | POA: Insufficient documentation

## 2014-05-06 DIAGNOSIS — E119 Type 2 diabetes mellitus without complications: Secondary | ICD-10-CM | POA: Insufficient documentation

## 2014-05-06 DIAGNOSIS — Z9484 Stem cells transplant status: Secondary | ICD-10-CM | POA: Insufficient documentation

## 2014-05-06 HISTORY — PX: VULVAR LESION REMOVAL: SHX5391

## 2014-05-06 HISTORY — PX: VULVECTOMY: SHX1086

## 2014-05-06 HISTORY — DX: Other intervertebral disc degeneration, lumbar region without mention of lumbar back pain or lower extremity pain: M51.369

## 2014-05-06 HISTORY — DX: Multiple myeloma not having achieved remission: C90.00

## 2014-05-06 HISTORY — DX: Stem cells transplant status: Z94.84

## 2014-05-06 HISTORY — DX: Nodules of vocal cords: J38.2

## 2014-05-06 HISTORY — PX: LESION REMOVAL: SHX5196

## 2014-05-06 HISTORY — DX: Type 2 diabetes mellitus without complications: E11.9

## 2014-05-06 HISTORY — DX: Polyneuropathy in diseases classified elsewhere: G63

## 2014-05-06 HISTORY — DX: Other intervertebral disc degeneration, lumbar region: M51.36

## 2014-05-06 LAB — POCT I-STAT 4, (NA,K, GLUC, HGB,HCT)
Glucose, Bld: 144 mg/dL — ABNORMAL HIGH (ref 70–99)
HCT: 45 % (ref 36.0–46.0)
Hemoglobin: 15.3 g/dL — ABNORMAL HIGH (ref 12.0–15.0)
POTASSIUM: 3.9 meq/L (ref 3.7–5.3)
SODIUM: 142 meq/L (ref 137–147)

## 2014-05-06 LAB — PROTIME-INR
INR: 1.02 (ref 0.00–1.49)
PROTHROMBIN TIME: 13.2 s (ref 11.6–15.2)

## 2014-05-06 LAB — GLUCOSE, CAPILLARY: GLUCOSE-CAPILLARY: 183 mg/dL — AB (ref 70–99)

## 2014-05-06 SURGERY — WIDE EXCISION VULVECTOMY
Anesthesia: General | Site: Vulva

## 2014-05-06 MED ORDER — LIDOCAINE HCL (CARDIAC) 20 MG/ML IV SOLN
INTRAVENOUS | Status: DC | PRN
  Administered 2014-05-06: 80 mg via INTRAVENOUS

## 2014-05-06 MED ORDER — LACTATED RINGERS IV SOLN
INTRAVENOUS | Status: DC | PRN
  Administered 2014-05-06 (×2): via INTRAVENOUS

## 2014-05-06 MED ORDER — PROMETHAZINE HCL 25 MG/ML IJ SOLN
INTRAMUSCULAR | Status: AC
  Filled 2014-05-06: qty 1

## 2014-05-06 MED ORDER — STERILE WATER FOR IRRIGATION IR SOLN
Status: DC | PRN
  Administered 2014-05-06: 500 mL

## 2014-05-06 MED ORDER — HYDROMORPHONE HCL PF 1 MG/ML IJ SOLN
0.2500 mg | INTRAMUSCULAR | Status: DC | PRN
  Filled 2014-05-06: qty 1

## 2014-05-06 MED ORDER — LIDOCAINE HCL 1 % IJ SOLN
INTRAMUSCULAR | Status: DC | PRN
  Administered 2014-05-06: 7 mL

## 2014-05-06 MED ORDER — SODIUM CHLORIDE 0.9 % IR SOLN
Status: DC | PRN
  Administered 2014-05-06: 500 mL

## 2014-05-06 MED ORDER — DEXAMETHASONE SODIUM PHOSPHATE 4 MG/ML IJ SOLN
INTRAMUSCULAR | Status: DC | PRN
  Administered 2014-05-06: 10 mg via INTRAVENOUS

## 2014-05-06 MED ORDER — LACTATED RINGERS IV SOLN
INTRAVENOUS | Status: DC
  Administered 2014-05-06: 06:00:00 via INTRAVENOUS
  Filled 2014-05-06: qty 1000

## 2014-05-06 MED ORDER — FENTANYL CITRATE 0.05 MG/ML IJ SOLN
INTRAMUSCULAR | Status: AC
  Filled 2014-05-06: qty 6

## 2014-05-06 MED ORDER — ONDANSETRON HCL 4 MG/2ML IJ SOLN
INTRAMUSCULAR | Status: DC | PRN
  Administered 2014-05-06: 4 mg via INTRAVENOUS

## 2014-05-06 MED ORDER — KETOROLAC TROMETHAMINE 30 MG/ML IJ SOLN
15.0000 mg | Freq: Once | INTRAMUSCULAR | Status: DC | PRN
  Filled 2014-05-06: qty 1

## 2014-05-06 MED ORDER — ACETAMINOPHEN 10 MG/ML IV SOLN
INTRAVENOUS | Status: DC | PRN
  Administered 2014-05-06: 1000 mg via INTRAVENOUS

## 2014-05-06 MED ORDER — SILVER SULFADIAZINE 1 % EX CREA
TOPICAL_CREAM | CUTANEOUS | Status: DC | PRN
  Administered 2014-05-06: 1 via TOPICAL

## 2014-05-06 MED ORDER — MIDAZOLAM HCL 5 MG/5ML IJ SOLN
INTRAMUSCULAR | Status: DC | PRN
  Administered 2014-05-06 (×2): 1 mg via INTRAVENOUS

## 2014-05-06 MED ORDER — MIDAZOLAM HCL 2 MG/2ML IJ SOLN
INTRAMUSCULAR | Status: AC
  Filled 2014-05-06: qty 2

## 2014-05-06 MED ORDER — FENTANYL CITRATE 0.05 MG/ML IJ SOLN
INTRAMUSCULAR | Status: DC | PRN
  Administered 2014-05-06: 12.5 ug via INTRAVENOUS
  Administered 2014-05-06: 25 ug via INTRAVENOUS
  Administered 2014-05-06: 12.5 ug via INTRAVENOUS
  Administered 2014-05-06: 25 ug via INTRAVENOUS
  Administered 2014-05-06 (×2): 12.5 ug via INTRAVENOUS

## 2014-05-06 MED ORDER — PROPOFOL 10 MG/ML IV BOLUS
INTRAVENOUS | Status: DC | PRN
  Administered 2014-05-06: 200 mg via INTRAVENOUS

## 2014-05-06 MED ORDER — KETOROLAC TROMETHAMINE 30 MG/ML IJ SOLN
INTRAMUSCULAR | Status: DC | PRN
  Administered 2014-05-06: 30 mg via INTRAVENOUS

## 2014-05-06 MED ORDER — PROMETHAZINE HCL 25 MG/ML IJ SOLN
6.2500 mg | INTRAMUSCULAR | Status: DC | PRN
  Administered 2014-05-06: 6.25 mg via INTRAVENOUS
  Filled 2014-05-06: qty 1

## 2014-05-06 MED ORDER — ACETIC ACID 5 % SOLN
Status: DC | PRN
  Administered 2014-05-06: 1 via TOPICAL

## 2014-05-06 MED ORDER — HYDROMORPHONE HCL 2 MG PO TABS: 1.0000 mg | ORAL_TABLET | Freq: Four times a day (QID) | ORAL | Status: DC | PRN

## 2014-05-06 MED ORDER — IODINE STRONG (LUGOLS) 5 % PO SOLN
ORAL | Status: DC | PRN
  Administered 2014-05-06: 5 mL

## 2014-05-06 SURGICAL SUPPLY — 42 items
BLADE SURG 15 STRL LF DISP TIS (BLADE) IMPLANT
BLADE SURG 15 STRL SS (BLADE) ×4
CANISTER SUCTION 1200CC (MISCELLANEOUS) IMPLANT
CANISTER SUCTION 2500CC (MISCELLANEOUS) ×2 IMPLANT
CATH ROBINSON RED A/P 16FR (CATHETERS) ×2 IMPLANT
CLOTH BEACON ORANGE TIMEOUT ST (SAFETY) ×4 IMPLANT
DRAPE LG THREE QUARTER DISP (DRAPES) ×4 IMPLANT
DRAPE UNDERBUTTOCKS STRL (DRAPE) ×4 IMPLANT
ELECT NDL TIP 2.8 STRL (NEEDLE) ×2 IMPLANT
ELECT NEEDLE TIP 2.8 STRL (NEEDLE) ×4 IMPLANT
ELECT REM PT RETURN 9FT ADLT (ELECTROSURGICAL) ×4
ELECTRODE REM PT RTRN 9FT ADLT (ELECTROSURGICAL) ×2 IMPLANT
GLOVE BIO SURGEON STRL SZ7.5 (GLOVE) ×4 IMPLANT
GLOVE BIOGEL M STRL SZ7.5 (GLOVE) ×4 IMPLANT
GLOVE INDICATOR 8.0 STRL GRN (GLOVE) ×4 IMPLANT
GOWN PREVENTION PLUS LG XLONG (DISPOSABLE) ×2 IMPLANT
GOWN STRL REIN XL XLG (GOWN DISPOSABLE) ×2 IMPLANT
GOWN STRL REUS W/ TWL XL LVL3 (GOWN DISPOSABLE) IMPLANT
GOWN STRL REUS W/TWL XL LVL3 (GOWN DISPOSABLE) ×6 IMPLANT
LEGGING LITHOTOMY PAIR STRL (DRAPES) ×4 IMPLANT
NDL SPNL 22GX3.5 QUINCKE BK (NEEDLE) IMPLANT
NEEDLE HYPO 22GX1.5 SAFETY (NEEDLE) ×4 IMPLANT
NEEDLE SPNL 22GX3.5 QUINCKE BK (NEEDLE) IMPLANT
NS IRRIG 500ML POUR BTL (IV SOLUTION) ×2 IMPLANT
PACK BASIN DAY SURGERY FS (CUSTOM PROCEDURE TRAY) ×4 IMPLANT
PAD PREP 24X48 CUFFED NSTRL (MISCELLANEOUS) ×4 IMPLANT
PENCIL BUTTON HOLSTER BLD 10FT (ELECTRODE) ×4 IMPLANT
SCOPETTES 8  STERILE (MISCELLANEOUS) ×4
SCOPETTES 8 STERILE (MISCELLANEOUS) ×4 IMPLANT
SPONGE LAP 18X18 X RAY DECT (DISPOSABLE) ×8 IMPLANT
SUT VIC AB 2-0 SH 27 (SUTURE) ×8
SUT VIC AB 2-0 SH 27XBRD (SUTURE) ×4 IMPLANT
SUT VIC AB 3-0 SH 18 (SUTURE) IMPLANT
SUT VIC AB 3-0 SH 27 (SUTURE) ×8
SUT VIC AB 3-0 SH 27X BRD (SUTURE) ×4 IMPLANT
SYR CONTROL 10ML LL (SYRINGE) ×4 IMPLANT
TOWEL OR 17X24 6PK STRL BLUE (TOWEL DISPOSABLE) ×8 IMPLANT
TRAY DSU PREP LF (CUSTOM PROCEDURE TRAY) ×4 IMPLANT
TUBE CONNECTING 12'X1/4 (SUCTIONS) ×1
TUBE CONNECTING 12X1/4 (SUCTIONS) ×3 IMPLANT
WATER STERILE IRR 500ML POUR (IV SOLUTION) ×4 IMPLANT
YANKAUER SUCT BULB TIP NO VENT (SUCTIONS) ×2 IMPLANT

## 2014-05-06 NOTE — H&P (Signed)
Pre op H&P  CC: VAIN III VINIII   Assessment/Plan:[name] is a 63 y.o. with previous history of multifocal genital dysplasia who now presents for abnormal Pap test. Evaluation that included colposcopy of the vagina and vulva is noteworthy for the presence of vaginal and vulvar dysplasia. 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 Will proceed with CO2 laser/excision of vulvar lesions and CO2 laser ablation of the vaginal lesion today   HPI: [name] is a 63 y.o. with IgA multiple myeloma under the care of Dr. Marko Howard. 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 In 02/15/2010 she underwent extensive CO2 laser ablation of the vulva perianal and vaginal dysplastic areas. A Pap test subsequently in June of 2000 the level returned with evidence of low-grade squamous intraepithelial lesion. Colposcopy was then performed with biopsy for September of 2011 never consistent with vain 1. [name] has not been seen since.  A Pap test was collected on 02/22/2014 was notable for atypical squamous cells of undetermined significance with high risk HPV detected. Patient seen 03/18/2014 vaginal and vulvar colposcopy with biopsies were performed. 1. Labium, biopsy, left  - HIGH GRADE SQUAMOUS INTRAEPITHELIAL LESION, VIN-III, EXTENDING TO THE EDGE OF THE BIOPSY.  2. Vulva, biopsy, posterior fourchette  - HIGH GRADE SQUAMOUS INTRAEPITHELIAL LESION, VIN-III, EXTENDING TO THE EDGE OF THE BIOPSY.  - NO INVASIVE CARCINOMA.  3. Vagina, biopsy, right lateral apex  - HIGH GRADE SQUAMOUS INTRAEPITHELIAL LESION, VAIN-III, EXTENDING TO THE EDGE OF THE BIOPSY.    Past Surgical Hx:  Past Surgical History   Procedure  Laterality  Date   .  Bone marrow transplant   2007   .  Abdominal hysterectomy     .  Appendectomy     .  Colonoscopy     .  Laparoscopic partial right colectomy     .  Cervical cone biopsy     .  Cataract extraction w/phaco  Left  01/20/2014    Procedure: CATARACT EXTRACTION PHACO AND INTRAOCULAR LENS PLACEMENT (IOC); Surgeon: Adonis Brook, MD; Location: [facility]; Service: Ophthalmology; Laterality: Left;   Past Medical Hx:  Past Medical History   Diagnosis  Date   .  Glaucoma      uses eye drops as instructed   .  Obesity    .  Personal history of colon cancer      next   .  Allergy    .  Herpes      takes Valtrex as needed   .  Diabetes mellitus      takes Metformin daily   .  GERD (gastroesophageal reflux disease)      takes Pantoprazole daily   .  Hypertension      takes Amlodipine and Diovan daily   .  Multiple myeloma      and colon cancer   .  History of bronchitis      Dec 2014   .  Bruises easily      d/t being on COumadin   .  History of colon polyps    .  Anemia      hx of   .  History of blood transfusion    .  Cataracts, bilateral    Past Gynecological History: No LMP recorded. Patient has had a hysterectomy.  Family Hx:  Family History   Problem  Relation  Age of Onset   .  Kidney failure  Mother    .  Hypertension  Mother    .  Prostate cancer  Father    .  Hypertension  Father      vascular disease   .  Stroke  Father    .  Hypertension  Sister    .  Hypertension  Sister    .  Hypertension  Brother    .  Hyperlipidemia  Brother    Review of Systems:  Constitutional  Feels well, voice is hoarse  Gastro Intestinal  No nausea, vomitting, or diarrhoea. No bright red blood per rectum, no abdominal pain, change in bowel movement, or constipation.  Genito Urinary  No frequency, urgency, dysuria, no pruritis or bleeding.  Musculo Skeletal  No myalgia, arthralgia, joint swelling or pain  Physical Exam:   BP 140/81  Pulse 73  Temp(Src) 97.6 F (36.4 C) (Oral)  Resp 16  Wt 209 lb (94.802 kg)  SpO2 97%  WD in NAD  Neck  Supple NROM, without any enlargements.  Cardiovascular  Pulse normal rate, regularity and rhythm.  Lungs  Clear to auscultation bilateraly  Psychiatry  Alert and  oriented normal speech mood and affect.  Abdomen  Normoactive bowel sounds, abdomen soft, non-tender and obese.  Back  No CVA tenderness  Genito Urinary 03/25/2014  Vulva/vagina: External genitalia was notable for changes consistent with VIN. A 2 x 1 cm lesion was appreciated the left labia minora. A 1 x 1 cm perineal lesion was also appreciated  Vagina: Ascetic acid applied to the vagina colposcopic changes were notable for the right vaginal apex with areas of superficial excoriation and white epithelium. A second red area was appreciated at the left vaginal apex .  Extremities  No bilateral cyanosis, clubbing or edema.

## 2014-05-06 NOTE — Anesthesia Procedure Notes (Signed)
Procedure Name: LMA Insertion Date/Time: 05/06/2014 7:42 AM Performed by: Justice Rocher Pre-anesthesia Checklist: Patient identified, Emergency Drugs available, Suction available and Patient being monitored Patient Re-evaluated:Patient Re-evaluated prior to inductionOxygen Delivery Method: Circle System Utilized Preoxygenation: Pre-oxygenation with 100% oxygen Intubation Type: IV induction Ventilation: Mask ventilation without difficulty LMA: LMA inserted LMA Size: 5.0 Number of attempts: 1 Airway Equipment and Method: bite block Placement Confirmation: positive ETCO2 Tube secured with: Tape Dental Injury: Teeth and Oropharynx as per pre-operative assessment

## 2014-05-06 NOTE — Anesthesia Postprocedure Evaluation (Signed)
  Anesthesia Post-op Note  Patient: Darlene Howard  Procedure(s) Performed: Procedure(s) (LRB): WIDE LOCAL EXCISION LEFT LABIA MAJORA (N/A) LASER OF VULVAR LESION (N/A) LASER OF THE VAGINA (N/A)  Patient Location: PACU  Anesthesia Type: General  Level of Consciousness: awake and alert   Airway and Oxygen Therapy: Patient Spontanous Breathing  Post-op Pain: mild  Post-op Assessment: Post-op Vital signs reviewed, Patient's Cardiovascular Status Stable, Respiratory Function Stable, Patent Airway and No signs of Nausea or vomiting  Last Vitals:  Filed Vitals:   05/06/14 0850  BP:   Pulse: 60  Temp: 36.4 C  Resp: 12    Post-op Vital Signs: stable   Complications: No apparent anesthesia complications

## 2014-05-06 NOTE — Interval H&P Note (Signed)
History and Physical Interval Note:  05/06/2014 7:28 AM  Darlene Howard  has presented today for surgery, with the diagnosis of VAIN 3 VIN 3  The various methods of treatment have been discussed with the patient and family. After consideration of risks, benefits and other options for treatment, the patient has consented to  Procedure(s): WIDE LOCAL EXCISION VULVECTOMY (N/A) LASER OF VULVAR LESION (N/A) LASER OF THE VAGINA (N/A) as a surgical intervention .  The patient's history has been reviewed, patient examined, no change in status, stable for surgery.  I have reviewed the patient's chart and labs.  Questions were answered to the patient's satisfaction.     Janie Morning

## 2014-05-06 NOTE — Transfer of Care (Signed)
   Immediate Anesthesia Transfer of Care Note  Patient: Darlene Howard  Procedure(s) Performed: Procedure(s) (LRB): WIDE LOCAL EXCISION LEFT LABIA MAJORA (N/A) LASER OF VULVAR LESION (N/A) LASER OF THE VAGINA (N/A)  Patient Location: PACU  Anesthesia Type: General  Level of Consciousness: awake, sedated, patient cooperative and responds to stimulation  Airway & Oxygen Therapy: Patient Spontanous Breathing and Patient connected to face mask oxygen  Post-op Assessment: Report given to PACU RN, Post -op Vital signs reviewed and stable and Patient moving all extremities  Post vital signs: Reviewed and stable  Complications: No apparent anesthesia complications

## 2014-05-06 NOTE — Op Note (Signed)
Preoperative Diagnosis: VAIN III VIN III   Postoperative Diagnosis: VAIN III VIN III  Procedure(s) Performed: Extensive CO2 laser of the vagina and Vulva  WLE labia majora 4cmx3cm  Surgeon: Francetta Found.  Skeet Latch, M.D. PhD   Anesthesia:  LMA  Indication for Procedure: Vaginal and vulvar biopsies c/w VAIN III and VIN III  Operative Findings: Application of lugols to the vagina indicated absence of uptake of the 1.  vaginal cuff 3cmx2cm. 2  Icm areas without uptake of the left and right vaginal wall.    3.posterior distal vagina without uptake 2cm x 3cm extending to the posterior fourchette.  Acetic acid was applied to the external genitalia.  Acetowhite changes noted on 1. the clitoral hood 2cmx2cm, 2. left inferior labia majora 3cmx4xm 3.  poterior fourchette 2cmxc2cm, 3.  perianal area inferior to the anus at 4o'clock. 2cmx2cm and and perianal at 8 0' clock 30mmx8mm   Procedure:Darlene Howard was taken to the operating room and time out procedure performed as per protocol.  She was placed under general anesthesia and then positioned in the dorsolithotomy position. The external genitalia was prepped and she was straight catheterized. She was then draped in the standard fashion and a second timeout procedure performed. Wet towels were placed around the active surgical sites as per laser protocol.  Lugol's was applied to the vagina and the changes noted as described above. Using CO2 laser at 87 W with continuous pulse an 8 mm margin was drawn around the vaginal lesions at the vaginal apex the lesions along the left and right vaginal sidewall and the posterior distal vagina .  The tissue within the boundaries of the identified sites was  CO2 lasered to the brawny tan tissue below.  Hemostasis was observed.  CO2 laser was used for ablation of the clitoral hood lesion the posterior fourchette lesion and the perianal areas inferior to the anus at 4:00 and 8:00. 8 mm margin was obtained around all the  lasered areas. Ablation was continued to the level of the tan tissue that is the third surgical plane.  Total laser time was approximately 15 minutes.  Attention was then turned to the lesion in the left inferior labia majora it measured 3 cm x 4 cm. A 1 cm margin was drawn around the lesion and the lesion excised using sharp dissection and Bovie cautery. Hemostasis of the surgical bed was achieved. The surgical site was closed in 2 layers the first consisted of interrupted Vicryl sutures and the second running subcuticular Vicryl suture.  Silver Silvadene was then applied to all of the lasered areas.  Specimens: Left inferior labia majora with stitch at 12:00  Estimated Blood Loss: Minimal mL. Blood Replacement: None  Sponge, lap and needle counts were correct x 3.    Complications: None  The patient had sequential compression devices for VTE prophylaxis.         Disposition: PACU - hemodynamically stable.         Condition: Stable

## 2014-05-06 NOTE — Discharge Instructions (Signed)
°  Post Anesthesia Home Care Instructions  Activity: Get plenty of rest for the remainder of the day. A responsible adult should stay with you for 24 hours following the procedure.  For the next 24 hours, DO NOT: -Drive a car -Paediatric nurse -Drink alcoholic beverages -Take any medication unless instructed by your physician -Make any legal decisions or sign important papers.  Meals: Start with liquid foods such as gelatin or soup. Progress to regular foods as tolerated. Avoid greasy, spicy, heavy foods. If nausea and/or vomiting occur, drink only clear liquids until the nausea and/or vomiting subsides. Call your physician if vomiting continues.  Special Instructions/Symptoms: Your throat may feel dry or sore from the anesthesia or the breathing tube placed in your throat during surgery. If this causes discomfort, gargle with warm salt water. The discomfort should disappear within 24 hours. Call your surgeon if you experience:   1.  Fever over 101.0. 2.  Inability to urinate. 3.  Nausea and/or vomiting. 4.  Extreme swelling or bruising at the surgical site. 5.  Continued bleeding from the incision. 6.  Increased pain, redness or drainage from the incision. 7.  Problems related to your pain medication.

## 2014-05-06 NOTE — Anesthesia Preprocedure Evaluation (Signed)
Anesthesia Evaluation  Patient identified by MRN, date of birth, ID band Patient awake    Reviewed: Allergy & Precautions, H&P , NPO status , Patient's Chart, lab work & pertinent test results  Airway Mallampati: II TM Distance: >3 FB Neck ROM: Full    Dental no notable dental hx.    Pulmonary neg pulmonary ROS,  breath sounds clear to auscultation  Pulmonary exam normal       Cardiovascular hypertension, Pt. on medications Rhythm:Regular Rate:Normal     Neuro/Psych negative neurological ROS  negative psych ROS   GI/Hepatic negative GI ROS, Neg liver ROS,   Endo/Other  diabetes, Type 2Morbid obesity  Renal/GU negative Renal ROS  negative genitourinary   Musculoskeletal negative musculoskeletal ROS (+)   Abdominal   Peds negative pediatric ROS (+)  Hematology Multiple myeloma DX  2006  STATE IIA,  IgA  KAPPA STABLE  PER DR MOHAMED--  S/P STEM CELL TRANSPLANT 2007 /  RECURRENCE 2008  CHEMO ENDED 03-30-2008/   NOW ON MAINTANANCE REVLIMID    Anesthesia Other Findings   Reproductive/Obstetrics negative OB ROS                           Anesthesia Physical Anesthesia Plan  ASA: III  Anesthesia Plan: General   Post-op Pain Management:    Induction: Intravenous  Airway Management Planned: LMA  Additional Equipment:   Intra-op Plan:   Post-operative Plan: Extubation in OR  Informed Consent: I have reviewed the patients History and Physical, chart, labs and discussed the procedure including the risks, benefits and alternatives for the proposed anesthesia with the patient or authorized representative who has indicated his/her understanding and acceptance.   Dental advisory given  Plan Discussed with: CRNA  Anesthesia Plan Comments:         Anesthesia Quick Evaluation

## 2014-05-07 ENCOUNTER — Other Ambulatory Visit: Payer: Self-pay | Admitting: *Deleted

## 2014-05-07 ENCOUNTER — Telehealth: Payer: Self-pay | Admitting: *Deleted

## 2014-05-07 ENCOUNTER — Telehealth: Payer: Self-pay | Admitting: Family Medicine

## 2014-05-07 ENCOUNTER — Encounter (HOSPITAL_BASED_OUTPATIENT_CLINIC_OR_DEPARTMENT_OTHER): Payer: Self-pay | Admitting: Gynecologic Oncology

## 2014-05-07 NOTE — Telephone Encounter (Signed)
Refill request for Revlimid to MD desk for approval.

## 2014-05-07 NOTE — Telephone Encounter (Signed)
noted 

## 2014-05-07 NOTE — Telephone Encounter (Signed)
Called pt, instructed pt to resume Coumadin Saturday per MD. Pt verbalized understanding.

## 2014-05-10 ENCOUNTER — Telehealth: Payer: Self-pay | Admitting: Medical Oncology

## 2014-05-10 DIAGNOSIS — C9000 Multiple myeloma not having achieved remission: Secondary | ICD-10-CM

## 2014-05-10 MED ORDER — LENALIDOMIDE 15 MG PO CAPS: ORAL_CAPSULE | ORAL | Status: DC

## 2014-05-10 NOTE — Telephone Encounter (Signed)
Revlimid refill faxed.

## 2014-05-11 ENCOUNTER — Telehealth: Payer: Self-pay

## 2014-05-11 ENCOUNTER — Telehealth: Payer: Self-pay | Admitting: *Deleted

## 2014-05-11 ENCOUNTER — Other Ambulatory Visit: Payer: Self-pay | Admitting: *Deleted

## 2014-05-11 DIAGNOSIS — C9000 Multiple myeloma not having achieved remission: Secondary | ICD-10-CM

## 2014-05-11 MED ORDER — OLOPATADINE HCL 0.1 % OP SOLN: 1.0000 [drp] | Freq: Two times a day (BID) | OPHTHALMIC | Status: DC

## 2014-05-11 MED ORDER — DEXAMETHASONE 4 MG PO TABS: ORAL_TABLET | ORAL | Status: DC

## 2014-05-11 NOTE — Telephone Encounter (Signed)
I have sent in patanol an allergy eye drop to [facility] let her know. If pain or vision loss develops needs to see eye Doc

## 2014-05-11 NOTE — Telephone Encounter (Signed)
Patient aware.

## 2014-05-11 NOTE — Telephone Encounter (Signed)
LMOVM notified pt surgery path results show pt has VIN III, margins not involved.

## 2014-05-14 ENCOUNTER — Other Ambulatory Visit: Payer: Self-pay | Admitting: *Deleted

## 2014-05-14 MED ORDER — SILVER SULFADIAZINE 1 % EX CREA: 1.0000 "application " | TOPICAL_CREAM | Freq: Every day | CUTANEOUS | Status: DC

## 2014-05-14 NOTE — Telephone Encounter (Signed)
Pt returned called regarding her Path results, request silvadene rx refill. Rx sent to pt's pharmacy

## 2014-05-18 ENCOUNTER — Telehealth: Payer: Self-pay | Admitting: Internal Medicine

## 2014-05-18 ENCOUNTER — Other Ambulatory Visit (HOSPITAL_BASED_OUTPATIENT_CLINIC_OR_DEPARTMENT_OTHER): Payer: 59

## 2014-05-18 ENCOUNTER — Ambulatory Visit (HOSPITAL_BASED_OUTPATIENT_CLINIC_OR_DEPARTMENT_OTHER): Payer: 59 | Admitting: Internal Medicine

## 2014-05-18 ENCOUNTER — Encounter: Payer: Self-pay | Admitting: Internal Medicine

## 2014-05-18 VITALS — BP 128/98 | HR 108 | Temp 97.3°F | Resp 18 | Ht 66.0 in | Wt 205.4 lb

## 2014-05-18 DIAGNOSIS — C9000 Multiple myeloma not having achieved remission: Secondary | ICD-10-CM

## 2014-05-18 DIAGNOSIS — Z9484 Stem cells transplant status: Secondary | ICD-10-CM

## 2014-05-18 LAB — CBC WITH DIFFERENTIAL/PLATELET
BASO%: 0.7 % (ref 0.0–2.0)
BASOS ABS: 0.1 10*3/uL (ref 0.0–0.1)
EOS ABS: 0.1 10*3/uL (ref 0.0–0.5)
EOS%: 1.3 % (ref 0.0–7.0)
HCT: 35.8 % (ref 34.8–46.6)
HGB: 11.4 g/dL — ABNORMAL LOW (ref 11.6–15.9)
LYMPH#: 1 10*3/uL (ref 0.9–3.3)
LYMPH%: 10.6 % — ABNORMAL LOW (ref 14.0–49.7)
MCH: 31.4 pg (ref 25.1–34.0)
MCHC: 31.8 g/dL (ref 31.5–36.0)
MCV: 98.8 fL (ref 79.5–101.0)
MONO#: 1.5 10*3/uL — ABNORMAL HIGH (ref 0.1–0.9)
MONO%: 16.3 % — ABNORMAL HIGH (ref 0.0–14.0)
NEUT%: 71.1 % (ref 38.4–76.8)
NEUTROS ABS: 6.5 10*3/uL (ref 1.5–6.5)
Platelets: 162 10*3/uL (ref 145–400)
RBC: 3.63 10*6/uL — ABNORMAL LOW (ref 3.70–5.45)
RDW: 16.4 % — ABNORMAL HIGH (ref 11.2–14.5)
WBC: 9.1 10*3/uL (ref 3.9–10.3)

## 2014-05-18 LAB — COMPREHENSIVE METABOLIC PANEL (CC13)
ALT: 46 U/L (ref 0–55)
AST: 30 U/L (ref 5–34)
Albumin: 3.3 g/dL — ABNORMAL LOW (ref 3.5–5.0)
Alkaline Phosphatase: 68 U/L (ref 40–150)
Anion Gap: 11 mEq/L (ref 3–11)
BUN: 16.8 mg/dL (ref 7.0–26.0)
CALCIUM: 10.2 mg/dL (ref 8.4–10.4)
CHLORIDE: 110 meq/L — AB (ref 98–109)
CO2: 23 mEq/L (ref 22–29)
Creatinine: 0.8 mg/dL (ref 0.6–1.1)
Glucose: 105 mg/dl (ref 70–140)
POTASSIUM: 4.2 meq/L (ref 3.5–5.1)
Sodium: 144 mEq/L (ref 136–145)
Total Bilirubin: 1.32 mg/dL — ABNORMAL HIGH (ref 0.20–1.20)
Total Protein: 7.2 g/dL (ref 6.4–8.3)

## 2014-05-18 LAB — LACTATE DEHYDROGENASE (CC13): LDH: 223 U/L (ref 125–245)

## 2014-05-18 NOTE — Telephone Encounter (Signed)
gv adn printed appt scehd and avs for pt for June

## 2014-05-18 NOTE — Progress Notes (Signed)
Darlene Howard [phone]   [phone] OFFICE PROGRESS NOTE  Darlene Nakayama, MD [address], [address] [place] [address]  DIAGNOSIS: Stage IIA IgA kappa multiple myeloma diagnosed in 2006   PRIOR THERAPY:  1) status post treatment with thalidomide and Decadron followed by autologous peripheral blood stem cell transplant with high-dose melphalan on 04/26/2006 at [facility].  2) the patient had evidence for disease recurrence in August of 2008 and she was treated with 6 cycles of Velcade completed on 03/30/2008 with response to the treatment but was discontinued secondary to neuropathy.  3) maintenance treatment with Revlimid 10 mg by mouth daily by the does was later increase to 25 mg by mouth daily secondary to biochemical recurrence with stabilization of her disease.   CURRENT THERAPY: Revlimid 15 mg by mouth daily for 2 weeks every 3 weeks in addition to Decadron currently at 20 mg by mouth on a weekly basis. She is expected to start the next cycle of her treatment on 11/10/2013.  INTERVAL HISTORY: Darlene Howard 63 y.o. female returns to the clinic today for routine monthly followup visit. She is tolerating her treatment with maintenance Revlimid fairly well with no significant adverse effects. She had laser surgery for valvular lesions recently and she is feeling well today. The patient denied having any fever or chills. She denied having any chest pain, shortness of breath, cough or hemoptysis. She has no weight loss or night sweats. The patient is tolerating her current treatment with Revlimid and Decadron fairly well. She had repeat myeloma panel performed recently and she is here for evaluation and discussion of her lab results.  MEDICAL HISTORY: Past Medical History  Diagnosis Date  . Glaucoma     uses eye drops as instructed  . Personal history of colon cancer     ASCENDING COLON--  S/P  RIGHT HEMICOLECTOMY WITH NEGATIVE NODES/     NO RECURRENCE  . Herpes     takes Valtrex as needed  . GERD (gastroesophageal reflux disease)     takes Pantoprazole daily  . Hypertension     takes Amlodipine and Diovan daily  . Bruises easily     d/t being on COumadin  . History of colon polyps   . Type 2 diabetes mellitus   . Neuropathy associated with multiple myeloma     POST CHEMOTHERAPY AND STEM CELL TRANSPLANT  . H/O stem cell transplant     04-26-2006  AT [facility]  . Multiple myeloma DX  2006  STATE IIA,  IgA  KAPPA    STABLE  PER DR Daniel Ritthaler--  S/P STEM CELL TRANSPLANT 2007 /  RECURRENCE 2008  CHEMO ENDED 03-30-2008/   NOW ON MAINTANANCE REVLIMID )  . Vocal cord nodule     ASSESSED BY DR TEOH--  LMA  OK W/ PROCEDURE'S  . DDD (degenerative disc disease), lumbar     ALLERGIES:  is allergic to codeine.  MEDICATIONS:  Current Outpatient Prescriptions  Medication Sig Dispense Refill  . alendronate (FOSAMAX) 70 MG tablet TAKE 1 TAB EACH WEEK 30 MIN PRIOR TO BREAKFAST WITH LARGE GLASS OF WATER ON AN EMPTY STOMACH. REMAIN UPRIGHT.  4 tablet  3  . amLODipine (NORVASC) 2.5 MG tablet TAKE 1 TABLET DAILY---  takes in am      . aspirin (ASPIRIN LOW DOSE) 81 MG EC tablet Take 81 mg by mouth daily.        . bimatoprost (LUMIGAN) 0.03 %  ophthalmic drops Place 1 drop into both eyes at bedtime.       . calcium-vitamin D (OSCAL 500/200 D-3) 500-200 MG-UNIT per tablet Take 1 tablet by mouth 3 (three) times daily.        Marland Kitchen dexamethasone (DECADRON) 4 MG tablet 5 tablets by mouth on a weekly basis.  20 tablet  0  . dorzolamide-timolol (COSOPT) 22.3-6.8 MG/ML ophthalmic solution Place 1 drop into both eyes 2 (two) times daily.      Marland Kitchen HYDROmorphone (DILAUDID) 2 MG tablet Take 0.5 tablets (1 mg total) by mouth every 6 (six) hours as needed for severe pain.  15 tablet  0  . lenalidomide (REVLIMID) 15 MG capsule Take one capsule daily for 14 days.  Then 7 days off.  14 capsule  0  . loratadine (CLARITIN) 10 MG tablet TAKE ONE TABLET BY MOUTH ONCE  DAILY.  30 tablet  4  . metFORMIN (GLUCOPHAGE) 1000 MG tablet Take 1,000 mg by mouth daily with breakfast.      . Multiple Vitamins-Minerals (CENTRUM SILVER PO) Take by mouth daily.        Marland Kitchen olopatadine (PATANOL) 0.1 % ophthalmic solution Place 1 drop into both eyes 2 (two) times daily.  5 mL  12  . pantoprazole (PROTONIX) 40 MG tablet TAKE ONE TABLET BY MOUTH EVERY DAY FOR ACID REFLUX.--  takes in am      . potassium chloride SA (K-DUR,KLOR-CON) 20 MEQ tablet TAKE 1 TABLET BY MOUTH TWICE DAILY.  60 tablet  4  . silver sulfADIAZINE (SILVADENE) 1 % cream Apply 1 application topically daily.  50 g  0  . valACYclovir (VALTREX) 1000 MG tablet TAKE ONE TABLET BY MOUTH DAILY AS NEEDED.  30 tablet  PRN  . valsartan-hydrochlorothiazide (DIOVAN-HCT) 320-25 MG per tablet TAKE ONE TABLET BY MOUTH DAILY.  30 tablet  4  . warfarin (COUMADIN) 2 MG tablet Take 1 tablet (2 mg total) by mouth daily.  30 tablet  0   No current facility-administered medications for this visit.    SURGICAL HISTORY:  Past Surgical History  Procedure Laterality Date  . Colonoscopy    . Cervical cone biopsy    . Cataract extraction w/phaco Left 01/20/2014    Procedure: CATARACT EXTRACTION PHACO AND INTRAOCULAR LENS PLACEMENT (IOC);  Surgeon: Adonis Brook, MD;  Location: [place];  Service: Ophthalmology;  Laterality: Left;  . 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 Co2  ablation vulva perianal and vaginal dysplatic areas  02-15-2010  . Abdominal hysterectomy  1983    w/  appendectomy and left salpingoophorectom  . Hemicolectomy Right 04-27-2005    CARCINOMA   ASCENDING COLON  . Vulvectomy N/A 05/06/2014    Procedure: WIDE LOCAL EXCISION LEFT LABIA MAJORA;  Surgeon: Janie Morning, MD;  Location: [facility];  Service: Gynecology;  Laterality: N/A;  . Vulvar lesion removal N/A 05/06/2014    Procedure: LASER OF VULVAR LESION;  Surgeon: Janie Morning, MD;  Location: [facility]  Service:  Gynecology;  Laterality: N/A;  . Lesion removal N/A 05/06/2014    Procedure: LASER OF THE VAGINA;  Surgeon: Janie Morning, MD;  Location[facility];  Service: Gynecology;  Laterality: N/A;    REVIEW OF SYSTEMS:  A comprehensive review of systems was negative.   PHYSICAL EXAMINATION: General appearance: alert, cooperative and no distress Head: Normocephalic, without obvious abnormality, atraumatic Neck: no adenopathy Lymph nodes: Cervical, supraclavicular, and axillary nodes normal. Resp: clear  to auscultation bilaterally Back: symmetric, no curvature. ROM normal. No CVA tenderness. Cardio: regular rate and rhythm, S1, S2 normal, no murmur, click, rub or gallop GI: soft, non-tender; bowel sounds normal; no masses,  no organomegaly Extremities: extremities normal, atraumatic, no cyanosis or edema Neurologic: Alert and oriented X 3, normal strength and tone. Normal symmetric reflexes. Normal coordination and gait  ECOG PERFORMANCE STATUS: 1 - Symptomatic but completely ambulatory  Blood pressure 128/98, pulse 108, temperature 97.3 F (36.3 C), temperature source Oral, resp. rate 18, height '5\' 6"'  (1.676 m), weight 205 lb 6.4 oz (93.169 kg).  LABORATORY DATA: Lab Results  Component Value Date   WBC 9.1 05/18/2014   HGB 11.4* 05/18/2014   HCT 35.8 05/18/2014   MCV 98.8 05/18/2014   PLT 162 05/18/2014      Chemistry      Component Value Date/Time   NA 142 05/06/2014 0707   NA 139 04/12/2014 1427   K 3.9 05/06/2014 0707   K 3.5 04/12/2014 1427   CL 104 02/18/2014 1217   CL 105 06/17/2013 1441   CO2 26 04/12/2014 1427   CO2 30 02/18/2014 1217   BUN 10.9 04/12/2014 1427   BUN 12 02/18/2014 1217   CREATININE 0.8 04/12/2014 1427   CREATININE 0.82 02/18/2014 1217   CREATININE 0.81 01/14/2014 1413      Component Value Date/Time   CALCIUM 9.5 04/12/2014 1427   CALCIUM 9.3 02/18/2014 1217   ALKPHOS 66 04/12/2014 1427   ALKPHOS 58 02/18/2014 1217   AST 26 04/12/2014 1427   AST 30  02/18/2014 1217   ALT 33 04/12/2014 1427   ALT 45* 02/18/2014 1217   BILITOT 1.15 04/12/2014 1427   BILITOT 1.2 02/18/2014 1217       RADIOGRAPHIC STUDIES: No results found.  ASSESSMENT AND PLAN: This is a very pleasant 63 years old [place] American female with history of multiple myeloma currently on treatment with Revlimid and low-dose Decadron and tolerating her treatment fairly well. I discussed the lab result with the patient today. I recommended for the patient to continue her current treatment with Revlimid and Decadron She would come back for followup visit in one month with repeat CBC and comprehensive metabolic panel. She was advised to call immediately if she has any concerning symptoms and interval.  The patient voices understanding of current disease status and treatment options and is in agreement with the current care plan.  All questions were answered. The patient knows to call the clinic with any problems, questions or concerns. We can certainly see the patient much sooner if necessary.  Disclaimer: This note was dictated with voice recognition software. Similar sounding words can inadvertently be transcribed and may not be corrected upon review.

## 2014-05-22 ENCOUNTER — Other Ambulatory Visit: Payer: Self-pay | Admitting: Family Medicine

## 2014-05-25 ENCOUNTER — Ambulatory Visit: Payer: 59 | Attending: Gynecologic Oncology | Admitting: Gynecologic Oncology

## 2014-05-25 ENCOUNTER — Encounter: Payer: Self-pay | Admitting: Gynecologic Oncology

## 2014-05-25 VITALS — BP 148/79 | HR 117 | Temp 98.5°F | Resp 18 | Ht 66.0 in | Wt 208.8 lb

## 2014-05-25 DIAGNOSIS — Z860100 Personal history of colon polyps, unspecified: Secondary | ICD-10-CM | POA: Insufficient documentation

## 2014-05-25 DIAGNOSIS — D072 Carcinoma in situ of vagina: Secondary | ICD-10-CM

## 2014-05-25 DIAGNOSIS — C9000 Multiple myeloma not having achieved remission: Secondary | ICD-10-CM | POA: Insufficient documentation

## 2014-05-25 DIAGNOSIS — H409 Unspecified glaucoma: Secondary | ICD-10-CM | POA: Insufficient documentation

## 2014-05-25 DIAGNOSIS — I10 Essential (primary) hypertension: Secondary | ICD-10-CM | POA: Insufficient documentation

## 2014-05-25 DIAGNOSIS — D071 Carcinoma in situ of vulva: Secondary | ICD-10-CM

## 2014-05-25 DIAGNOSIS — K219 Gastro-esophageal reflux disease without esophagitis: Secondary | ICD-10-CM | POA: Insufficient documentation

## 2014-05-25 DIAGNOSIS — E119 Type 2 diabetes mellitus without complications: Secondary | ICD-10-CM | POA: Insufficient documentation

## 2014-05-25 DIAGNOSIS — Z85038 Personal history of other malignant neoplasm of large intestine: Secondary | ICD-10-CM | POA: Insufficient documentation

## 2014-05-25 DIAGNOSIS — N893 Dysplasia of vagina, unspecified: Secondary | ICD-10-CM | POA: Insufficient documentation

## 2014-05-25 NOTE — Patient Instructions (Signed)
Follow-up in 6 months  Continue the excellent wound care.  Thank you very much [name] for allowing me to provide care for you today.  I appreciate your confidence in choosing our Gynecologic Oncology team.  If you have any questions about your visit today please call our office and we will get back to you as soon as possible.  Francetta Found. Mallorie Norrod MD., PhD Gynecologic Oncology

## 2014-05-25 NOTE — Progress Notes (Signed)
GYN ONCOLOGY OFFICE VISIT    Darlene Howard 63 y.o. female  CC: VAIN III VIN III  Post op check.  Assessment/Plan:[name]  is a 63 y.o. with previous history of multifocal genital dysplasia who now presents for abnormal Pap test. Evaluation 02/2014 that included colposcopy of the vagina and vulva is noteworthy for the presence of vaginal and vulvar dysplasia.  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  On 05/06/2014 she underwent CO2 laser/excision of vulvar lesions and CO2 laser ablation of the vaginal lesion.  Follow-up with Gyn Onc in 6 months   HPI: [name]  is a 63 y.o.  with IgA multiple myeloma under the care of Dr.Mohammed Mohammed. 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   In 02/15/2010 she underwent extensive CO2 laser ablation of the vulva perianal and vaginal dysplastic areas. A Pap test subsequently in June of 2000 the level returned with evidence of low-grade squamous intraepithelial lesion. Colposcopy was then performed with biopsy for September of 2011 was consistent with vain 1. Darlene Howard has not been seen since.  A Pap test was collected on 02/22/2014 was notable for atypical squamous cells of undetermined significance with high risk HPV detected. Patient seen 03/18/2014 vaginal and vulvar colposcopy with biopsies were  performed.. 1. Labium, biopsy,  VIN-III, EXTENDING TO THE EDGE OF THE BIOPSY. 2. Vulva, biopsy, posterior fourchette - VIN-III,  3. Vagina, biopsy, right lateral apex - VAIN-III.  On 05/06/2014 she underwent extensive multifocal CO2 laser of the vulva and vagina and WLE of a left vulvar lesion. Path Vulva, excision, inferior left labia majora (VIN-III/CIS), MARGINS NOT INVOLVED.   Past Surgical Hx:  Past Surgical History  Procedure Laterality Date  . Colonoscopy    . Cervical cone biopsy    . Cataract extraction w/phaco Left 01/20/2014    Procedure: CATARACT EXTRACTION PHACO AND INTRAOCULAR  LENS PLACEMENT (IOC);  Surgeon: Adonis Brook, MD;  Location: [place];  Service: Ophthalmology;  Laterality: Left;  . 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 Co2  ablation vulva perianal and vaginal dysplatic areas  02-15-2010  . Abdominal hysterectomy  1983    w/  appendectomy and left salpingoophorectom  . Hemicolectomy Right 04-27-2005    CARCINOMA   ASCENDING COLON  . Vulvectomy N/A 05/06/2014    Procedure: WIDE LOCAL EXCISION LEFT LABIA MAJORA;  Surgeon: Janie Morning, MD;  Location: [facility];  Service: Gynecology;  Laterality: N/A;  . Vulvar lesion removal N/A 05/06/2014    Procedure: LASER OF VULVAR LESION;  Surgeon: Janie Morning, MD;  Location: [facility];  Service: Gynecology;  Laterality: N/A;  . Lesion removal N/A 05/06/2014    Procedure: LASER OF THE VAGINA;  Surgeon: Janie Morning, MD;  Location: [facility];  Service: Gynecology;  Laterality: N/A;    Past Medical Hx:  Past Medical History  Diagnosis Date  . Glaucoma     uses eye drops as instructed  . Personal history of colon cancer     ASCENDING COLON--  S/P  RIGHT HEMICOLECTOMY WITH NEGATIVE NODES/    NO RECURRENCE  . Herpes     takes Valtrex as needed  . GERD (gastroesophageal reflux disease)     takes Pantoprazole daily  . Hypertension     takes Amlodipine and Diovan daily  . Bruises easily     d/t being on COumadin  . History  of colon polyps   . Type 2 diabetes mellitus   . Neuropathy associated with multiple myeloma     POST CHEMOTHERAPY AND STEM CELL TRANSPLANT  . H/O stem cell transplant     04-26-2006  AT [facility]  . Multiple myeloma DX  2006  STATE IIA,  IgA  KAPPA    STABLE  PER DR MOHAMED--  S/P STEM CELL TRANSPLANT 2007 /  RECURRENCE 2008  CHEMO ENDED 03-30-2008/   NOW ON MAINTANANCE REVLIMID )  . Vocal cord nodule     ASSESSED BY DR TEOH--  LMA  OK W/ PROCEDURE'S  . DDD (degenerative disc disease), lumbar     Past Gynecological History:   No  LMP recorded. Patient has had a hysterectomy.  Family Hx:  Family History  Problem Relation Age of Onset  . Kidney failure Mother   . Hypertension Mother   . Prostate cancer Father   . Hypertension Father     vascular disease   . Stroke Father   . Hypertension Sister   . Hypertension Sister   . Hypertension Brother   . Hyperlipidemia Brother     Review of Systems:  Constitutional  Feels well, voice is hoarse Gastro Intestinal  No nausea, vomitting, or diarrhoea. Genito Urinary  No frequency, urgency, dysuria, no pruritis or bleeding. Musculo Skeletal  No myalgia, arthralgia, joint swelling or pain   Physical Exam: There were no vitals taken for this visit.  WD in NAD Normoactive bowel sounds, abdomen soft, non-tender and obese.  Back No CVA tenderness Genito Urinary   Vulva/vagina: External genitalia was notable for changes consistent with healing of the vagina, vulvar and perianal area.   Extremities  No bilateral cyanosis, clubbing or edema.

## 2014-05-29 ENCOUNTER — Other Ambulatory Visit: Payer: Self-pay | Admitting: Family Medicine

## 2014-05-31 ENCOUNTER — Other Ambulatory Visit: Payer: Self-pay | Admitting: *Deleted

## 2014-05-31 DIAGNOSIS — C9000 Multiple myeloma not having achieved remission: Secondary | ICD-10-CM

## 2014-05-31 MED ORDER — WARFARIN SODIUM 2 MG PO TABS: 2.0000 mg | ORAL_TABLET | Freq: Every day | ORAL | Status: DC

## 2014-05-31 NOTE — Telephone Encounter (Addendum)
THIS REFILL REQUEST FOR REVLIMID WAS PLACED ON DR.MOHAMED'S NURSE'S DESK.

## 2014-06-10 ENCOUNTER — Other Ambulatory Visit: Payer: Self-pay | Admitting: *Deleted

## 2014-06-10 DIAGNOSIS — C9000 Multiple myeloma not having achieved remission: Secondary | ICD-10-CM

## 2014-06-10 MED ORDER — DEXAMETHASONE 4 MG PO TABS: ORAL_TABLET | ORAL | Status: DC

## 2014-06-12 LAB — MICROALBUMIN / CREATININE URINE RATIO
Creatinine, Urine: 91.6 mg/dL
MICROALB/CREAT RATIO: 6.9 mg/g (ref 0.0–30.0)
Microalb, Ur: 0.63 mg/dL (ref 0.00–1.89)

## 2014-06-12 LAB — HEPATIC FUNCTION PANEL
ALBUMIN: 3.6 g/dL (ref 3.5–5.2)
ALK PHOS: 58 U/L (ref 39–117)
ALT: 56 U/L — ABNORMAL HIGH (ref 0–35)
AST: 22 U/L (ref 0–37)
BILIRUBIN DIRECT: 0.3 mg/dL (ref 0.0–0.3)
Indirect Bilirubin: 0.9 mg/dL (ref 0.2–1.2)
Total Bilirubin: 1.2 mg/dL (ref 0.2–1.2)
Total Protein: 6.3 g/dL (ref 6.0–8.3)

## 2014-06-12 LAB — HEMOGLOBIN A1C
Hgb A1c MFr Bld: 6.6 % — ABNORMAL HIGH (ref <5.7)
Mean Plasma Glucose: 143 mg/dL — ABNORMAL HIGH (ref <117)

## 2014-06-14 LAB — VITAMIN D 25 HYDROXY (VIT D DEFICIENCY, FRACTURES): VIT D 25 HYDROXY: 29 ng/mL — AB (ref 30–89)

## 2014-06-15 ENCOUNTER — Encounter: Payer: Self-pay | Admitting: Family Medicine

## 2014-06-15 ENCOUNTER — Encounter (INDEPENDENT_AMBULATORY_CARE_PROVIDER_SITE_OTHER): Payer: Self-pay

## 2014-06-15 ENCOUNTER — Ambulatory Visit (INDEPENDENT_AMBULATORY_CARE_PROVIDER_SITE_OTHER): Payer: 59 | Admitting: Family Medicine

## 2014-06-15 VITALS — BP 130/80 | HR 60 | Resp 18 | Ht 66.0 in | Wt 210.1 lb

## 2014-06-15 DIAGNOSIS — IMO0002 Reserved for concepts with insufficient information to code with codable children: Secondary | ICD-10-CM

## 2014-06-15 DIAGNOSIS — E669 Obesity, unspecified: Secondary | ICD-10-CM

## 2014-06-15 DIAGNOSIS — E119 Type 2 diabetes mellitus without complications: Secondary | ICD-10-CM

## 2014-06-15 DIAGNOSIS — D010 Carcinoma in situ of colon: Secondary | ICD-10-CM

## 2014-06-15 DIAGNOSIS — I10 Essential (primary) hypertension: Secondary | ICD-10-CM

## 2014-06-15 DIAGNOSIS — M5410 Radiculopathy, site unspecified: Secondary | ICD-10-CM

## 2014-06-15 NOTE — Patient Instructions (Addendum)
F/u in 4 month, call if you need me before  Blood pressure and labs are excellent, except liver enzyme is up, please wok on weight loss, you do have fatty liver  We will add hepatitis panel to recent labs   'Weight loss goal of 5 to 7 pounds    Fasting lipid, cmp and eGFR , hBa1C in 4 month before visit  Pls start OTC vit D3 600 to 800 IU daily along with the twice daily calcium wih D that you already have  You need mammogram in August and colonoscopy in November    

## 2014-06-16 ENCOUNTER — Telehealth: Payer: Self-pay | Admitting: Internal Medicine

## 2014-06-16 ENCOUNTER — Ambulatory Visit (HOSPITAL_BASED_OUTPATIENT_CLINIC_OR_DEPARTMENT_OTHER): Payer: 59 | Admitting: Internal Medicine

## 2014-06-16 ENCOUNTER — Encounter: Payer: Self-pay | Admitting: Internal Medicine

## 2014-06-16 ENCOUNTER — Other Ambulatory Visit (HOSPITAL_BASED_OUTPATIENT_CLINIC_OR_DEPARTMENT_OTHER): Payer: 59

## 2014-06-16 VITALS — BP 153/77 | HR 98 | Temp 97.4°F | Resp 18 | Ht 66.0 in | Wt 210.1 lb

## 2014-06-16 DIAGNOSIS — Z9484 Stem cells transplant status: Secondary | ICD-10-CM

## 2014-06-16 DIAGNOSIS — C9000 Multiple myeloma not having achieved remission: Secondary | ICD-10-CM

## 2014-06-16 LAB — CBC WITH DIFFERENTIAL/PLATELET
BASO%: 0.4 % (ref 0.0–2.0)
BASOS ABS: 0 10*3/uL (ref 0.0–0.1)
EOS ABS: 0.2 10*3/uL (ref 0.0–0.5)
EOS%: 2.3 % (ref 0.0–7.0)
HEMATOCRIT: 33.5 % — AB (ref 34.8–46.6)
HEMOGLOBIN: 10.6 g/dL — AB (ref 11.6–15.9)
LYMPH%: 12 % — ABNORMAL LOW (ref 14.0–49.7)
MCH: 31.2 pg (ref 25.1–34.0)
MCHC: 31.6 g/dL (ref 31.5–36.0)
MCV: 98.5 fL (ref 79.5–101.0)
MONO#: 0.4 10*3/uL (ref 0.1–0.9)
MONO%: 5.2 % (ref 0.0–14.0)
NEUT%: 80.1 % — AB (ref 38.4–76.8)
NEUTROS ABS: 6 10*3/uL (ref 1.5–6.5)
Platelets: 161 10*3/uL (ref 145–400)
RBC: 3.4 10*6/uL — ABNORMAL LOW (ref 3.70–5.45)
RDW: 15.7 % — ABNORMAL HIGH (ref 11.2–14.5)
WBC: 7.5 10*3/uL (ref 3.9–10.3)
lymph#: 0.9 10*3/uL (ref 0.9–3.3)
nRBC: 2 % — ABNORMAL HIGH (ref 0–0)

## 2014-06-16 LAB — COMPREHENSIVE METABOLIC PANEL (CC13)
ALBUMIN: 3.1 g/dL — AB (ref 3.5–5.0)
ALK PHOS: 59 U/L (ref 40–150)
ALT: 28 U/L (ref 0–55)
AST: 13 U/L (ref 5–34)
Anion Gap: 9 mEq/L (ref 3–11)
BILIRUBIN TOTAL: 1.01 mg/dL (ref 0.20–1.20)
BUN: 6.7 mg/dL — AB (ref 7.0–26.0)
CO2: 27 meq/L (ref 22–29)
Calcium: 9.5 mg/dL (ref 8.4–10.4)
Chloride: 107 mEq/L (ref 98–109)
Creatinine: 0.8 mg/dL (ref 0.6–1.1)
GLUCOSE: 132 mg/dL (ref 70–140)
Potassium: 3.4 mEq/L — ABNORMAL LOW (ref 3.5–5.1)
SODIUM: 143 meq/L (ref 136–145)
Total Protein: 6.5 g/dL (ref 6.4–8.3)

## 2014-06-16 LAB — LACTATE DEHYDROGENASE (CC13): LDH: 207 U/L (ref 125–245)

## 2014-06-16 LAB — TECHNOLOGIST REVIEW

## 2014-06-16 NOTE — Assessment & Plan Note (Signed)
rept colonoscopy  Due in Oct 2015, will enter future referral

## 2014-06-16 NOTE — Telephone Encounter (Signed)
gv and printed appt sched and avs fo rpt for July °

## 2014-06-16 NOTE — Progress Notes (Signed)
   Subjective:    Patient ID: Darlene Howard, female    DOB: 02-02-1951, 63 y.o.   MRN: [id number]  HPI The PT is here for follow up and re-evaluation of chronic medical conditions, medication management and review of any available recent lab and radiology data.  Preventive health is updated, specifically  Cancer screening and Immunization.   Since her last visit , she had genital surgery for localized neoplasia, also had neurosurg eval for severe stenosis and disc disease The PT denies any adverse reactions to current medications since the last visit.  Several weeks ago was mentally and emotionally distraught due to loss of close family member and friend, she has recovered at this time    Review of Systems See HPI Denies recent fever or chills. Denies sinus pressure, nasal congestion, ear pain or sore throat. Denies chest congestion, productive cough or wheezing. Denies chest pains, palpitations and leg swelling Denies abdominal pain, nausea, vomiting,diarrhea or constipation.   Denies dysuria, frequency, hesitancy or incontinence. C/o chronic back pain at times causing  limitation in mobility. Denies headaches, seizures, numbness, or tingling. Denies depression,uncontroled  anxiety or insomnia. Denies skin break down or rash.        Objective:   Physical Exam BP 130/80  Pulse 60  Resp 18  Ht 5\' 6"  (1.676 m)  Wt 210 lb 1.9 oz (95.31 kg)  BMI 33.93 kg/m2  SpO2 100% Patient alert and oriented and in no cardiopulmonary distress.  HEENT: No facial asymmetry, EOMI,   oropharynx pink and moist.  Neck supple no JVD, no mass.  Chest: Clear to auscultation bilaterally.  CVS: S1, S2 no murmurs, no S3.  ABD: Soft non tender.   Ext: No edema  MS: Decreased  ROM spine, adequate in shoulders, hips and knees.  Skin: Intact, no ulcerations or rash noted.  Psych: Good eye contact, normal affect. Memory intact not anxious or depressed appearing.  CNS: CN 2-12 intact, power,   normal throughout.no focal deficits noted.        Assessment & Plan:  HYPERTENSION Controlled, no change in medication DASH diet and commitment to daily physical activity for a minimum of 30 minutes discussed and encouraged, as a part of hypertension management. The importance of attaining a healthy weight is also discussed.   Type 2 diabetes mellitus, controlled Controlled, no change in medication Patient advised to reduce carb and sweets, commit to regular physical activity, take meds as prescribed, test blood as directed, and attempt to lose weight, to improve blood sugar control.   Back pain with left-sided radiculopathy Neurosurgery eval completed, epidural recommended, pt has chosen to hold off on that and states back pain is not a major issue at this time  Carcinoma in situ of colon rept colonoscopy  Due in Oct 2015, will enter future referral  OBESITY Unchnaged Patient re-educated about  the importance of commitment to a  minimum of 150 minutes of exercise per week. The importance of healthy food choices with portion control discussed. Encouraged to start a food diary, count calories and to consider  joining a support group. Sample diet sheets offered. Goals set by the patient for the next several months.

## 2014-06-16 NOTE — Assessment & Plan Note (Signed)
Controlled, no change in medication DASH diet and commitment to daily physical activity for a minimum of 30 minutes discussed and encouraged, as a part of hypertension management. The importance of attaining a healthy weight is also discussed.  

## 2014-06-16 NOTE — Progress Notes (Signed)
Darlene Howard [phone]   Fax:([phone] OFFICE PROGRESS NOTE  [name], MD [address], [address] [place] [address]  DIAGNOSIS: Stage IIA IgA kappa multiple myeloma diagnosed in 2006   PRIOR THERAPY:  1) status post treatment with thalidomide and Decadron followed by autologous peripheral blood stem cell transplant with high-dose melphalan on 04/26/2006 at [facility].  2) the patient had evidence for disease recurrence in August of 2008 and she was treated with 6 cycles of Velcade completed on 03/30/2008 with response to the treatment but was discontinued secondary to neuropathy.  3) maintenance treatment with Revlimid 10 mg by mouth daily by the does was later increase to 25 mg by mouth daily secondary to biochemical recurrence with stabilization of her disease.   CURRENT THERAPY: Revlimid 15 mg by mouth daily for 2 weeks every 3 weeks in addition to Decadron currently at 20 mg by mouth on a weekly basis.   ADVANCED DIRECTIVE: She does not have advanced directive and was given information.  INTERVAL HISTORY: Darlene Howard 63 y.o. female returns to the clinic today for routine monthly followup visit. She has no complaints today. The patient denied having any fever or chills. She denied having any chest pain, shortness of breath, cough or hemoptysis. She has no weight loss or night sweats. The patient is tolerating her current treatment with Revlimid and Decadron fairly well.   MEDICAL HISTORY: Past Medical History  Diagnosis Date  . Glaucoma     uses eye drops as instructed  . Personal history of colon cancer     ASCENDING COLON--  S/P  RIGHT HEMICOLECTOMY WITH NEGATIVE NODES/    NO RECURRENCE  . Herpes     takes Valtrex as needed  . GERD (gastroesophageal reflux disease)     takes Pantoprazole daily  . Hypertension     takes Amlodipine and Diovan daily  . Bruises easily     d/t being on COumadin  . History of colon  polyps   . Type 2 diabetes mellitus   . Neuropathy associated with multiple myeloma     POST CHEMOTHERAPY AND STEM CELL TRANSPLANT  . H/O stem cell transplant     04-26-2006  AT [facility]  . Multiple myeloma DX  2006  STATE IIA,  IgA  KAPPA    STABLE  PER DR MOHAMED--  S/P STEM CELL TRANSPLANT 2007 /  RECURRENCE 2008  CHEMO ENDED 03-30-2008/   NOW ON MAINTANANCE REVLIMID )  . Vocal cord nodule     ASSESSED BY DR TEOH--  LMA  OK W/ PROCEDURE'S  . DDD (degenerative disc disease), lumbar     ALLERGIES:  is allergic to codeine.  MEDICATIONS:  Current Outpatient Prescriptions  Medication Sig Dispense Refill  . Potassium Chloride Crys CR (K-DUR PO) Take 20 mEq by mouth daily.      Marland Kitchen alendronate (FOSAMAX) 70 MG tablet TAKE 1 TAB EACH WEEK 30 MIN PRIOR TO BREAKFAST WITH LARGE GLASS OF WATER ON AN EMPTY STOMACH. REMAIN UPRIGHT.  4 tablet  3  . amLODipine (NORVASC) 2.5 MG tablet TAKE 1 TABLET DAILY---  takes in am      . aspirin (ASPIRIN LOW DOSE) 81 MG EC tablet Take 81 mg by mouth daily.        . bimatoprost (LUMIGAN) 0.03 % ophthalmic drops Place 1 drop into both eyes at bedtime.       . calcium carbonate (TUMS EX) 750 MG  chewable tablet Chew by mouth.      . calcium-vitamin D (OSCAL 500/200 D-3) 500-200 MG-UNIT per tablet Take 1 tablet by mouth 3 (three) times daily.        Marland Kitchen dexamethasone (DECADRON) 4 MG tablet 5 tablets by mouth on a weekly basis.  20 tablet  1  . dorzolamide-timolol (COSOPT) 22.3-6.8 MG/ML ophthalmic solution Place 1 drop into both eyes 2 (two) times daily.      Marland Kitchen lenalidomide (REVLIMID) 15 MG capsule Take one capsule daily for 14 days.  Then 7 days off.  14 capsule  0  . loratadine (CLARITIN) 10 MG tablet TAKE ONE TABLET BY MOUTH ONCE DAILY.  30 tablet  4  . metFORMIN (GLUCOPHAGE) 1000 MG tablet Take 1,000 mg by mouth daily with breakfast.      . Multiple Vitamins-Minerals (CENTRUM SILVER PO) Take by mouth daily.        Marland Kitchen olopatadine (PATANOL) 0.1 % ophthalmic solution Place  1 drop into both eyes 2 (two) times daily.  5 mL  12  . ONE TOUCH ULTRA TEST test strip USE AS DIRECTED TO CHECK DAILY.  50 each  3  . pantoprazole (PROTONIX) 40 MG tablet TAKE ONE TABLET BY MOUTH EVERY DAY FOR ACID REFLUX.--  takes in am      . potassium chloride SA (K-DUR,KLOR-CON) 20 MEQ tablet TAKE 1 TABLET BY MOUTH TWICE DAILY.  60 tablet  4  . silver sulfADIAZINE (SILVADENE) 1 % cream Apply 1 application topically daily.  50 g  0  . valACYclovir (VALTREX) 1000 MG tablet TAKE ONE TABLET BY MOUTH DAILY AS NEEDED.  30 tablet  PRN  . valsartan-hydrochlorothiazide (DIOVAN-HCT) 320-25 MG per tablet TAKE ONE TABLET BY MOUTH DAILY.  30 tablet  4  . warfarin (COUMADIN) 2 MG tablet Take 1 tablet (2 mg total) by mouth daily.  30 tablet  1   No current facility-administered medications for this visit.    SURGICAL HISTORY:  Past Surgical History  Procedure Laterality Date  . Colonoscopy    . Cervical cone biopsy    . Cataract extraction w/phaco Left 01/20/2014    Procedure: CATARACT EXTRACTION PHACO AND INTRAOCULAR LENS PLACEMENT (IOC);  Surgeon: Adonis Brook, MD;  Location: [place];  Service: Ophthalmology;  Laterality: Left;  . 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 Co2  ablation vulva perianal and vaginal dysplatic areas  02-15-2010  . Abdominal hysterectomy  1983    w/  appendectomy and left salpingoophorectom  . Hemicolectomy Right 04-27-2005    CARCINOMA   ASCENDING COLON  . Vulvectomy N/A 05/06/2014    Procedure: WIDE LOCAL EXCISION LEFT LABIA MAJORA;  Surgeon: Janie Morning, MD;  Location: [facility];  Service: Gynecology;  Laterality: N/A;  . Vulvar lesion removal N/A 05/06/2014    Procedure: LASER OF VULVAR LESION;  Surgeon: Janie Morning, MD;  Location: [facility];  Service: Gynecology;  Laterality: N/A;  . Lesion removal N/A 05/06/2014    Procedure: LASER OF THE VAGINA;  Surgeon: Janie Morning, MD;  Location: [facility];   Service: Gynecology;  Laterality: N/A;    REVIEW OF SYSTEMS:  A comprehensive review of systems was negative.   PHYSICAL EXAMINATION: General appearance: alert, cooperative and no distress Head: Normocephalic, without obvious abnormality, atraumatic Neck: no adenopathy Lymph nodes: Cervical, supraclavicular, and axillary nodes normal. Resp: clear to auscultation bilaterally Back: symmetric, no curvature. ROM normal. No CVA tenderness. Cardio: regular rate and  rhythm, S1, S2 normal, no murmur, click, rub or gallop GI: soft, non-tender; bowel sounds normal; no masses,  no organomegaly Extremities: extremities normal, atraumatic, no cyanosis or edema Neurologic: Alert and oriented X 3, normal strength and tone. Normal symmetric reflexes. Normal coordination and gait  ECOG PERFORMANCE STATUS: 1 - Symptomatic but completely ambulatory  Blood pressure 153/77, pulse 98, temperature 97.4 F (36.3 C), temperature source Oral, resp. rate 18, height _0  (1.676 m), weight 210 lb 1.6 oz (95.301 kg).  LABORATORY DATA: Lab Results  Component Value Date   WBC 7.5 06/16/2014   HGB 10.6* 06/16/2014   HCT 33.5* 06/16/2014   MCV 98.5 06/16/2014   PLT 161 06/16/2014      Chemistry      Component Value Date/Time   NA 144 05/18/2014 1502   NA 142 05/06/2014 0707   K 4.2 05/18/2014 1502   K 3.9 05/06/2014 0707   CL 104 02/18/2014 1217   CL 105 06/17/2013 1441   CO2 23 05/18/2014 1502   CO2 30 02/18/2014 1217   BUN 16.8 05/18/2014 1502   BUN 12 02/18/2014 1217   CREATININE 0.8 05/18/2014 1502   CREATININE 0.82 02/18/2014 1217   CREATININE 0.81 01/14/2014 1413      Component Value Date/Time   CALCIUM 10.2 05/18/2014 1502   CALCIUM 9.3 02/18/2014 1217   ALKPHOS 58 06/12/2014 0936   ALKPHOS 68 05/18/2014 1502   AST 22 06/12/2014 0936   AST 30 05/18/2014 1502   ALT 56* 06/12/2014 0936   ALT 46 05/18/2014 1502   BILITOT 1.2 06/12/2014 0936   BILITOT 1.32* 05/18/2014 1502       RADIOGRAPHIC STUDIES: No results  found.  ASSESSMENT AND PLAN: This is a very pleasant 63 years old [place] American female with history of multiple myeloma currently on treatment with Revlimid and low-dose Decadron and tolerating her treatment fairly well. I discussed the lab result with the patient today. I recommended for the patient to continue her current treatment with Revlimid and Decadron She would come back for followup visit in one month with repeat CBC, comprehensive metabolic panel and myeloma panel. She was advised to call immediately if she has any concerning symptoms and interval.  The patient voices understanding of current disease status and treatment options and is in agreement with the current care plan.  All questions were answered. The patient knows to call the clinic with any problems, questions or concerns. We can certainly see the patient much sooner if necessary.  Disclaimer: This note was dictated with voice recognition software. Similar sounding words can inadvertently be transcribed and may not be corrected upon review.

## 2014-06-16 NOTE — Assessment & Plan Note (Signed)
Controlled, no change in medication Patient advised to reduce carb and sweets, commit to regular physical activity, take meds as prescribed, test blood as directed, and attempt to lose weight, to improve blood sugar control.  

## 2014-06-16 NOTE — Assessment & Plan Note (Signed)
Neurosurgery eval completed, epidural recommended, pt has chosen to hold off on that and states back pain is not a major issue at this time

## 2014-06-16 NOTE — Assessment & Plan Note (Signed)
Unchnaged. Patient re-educated about  the importance of commitment to a  minimum of 150 minutes of exercise per week. The importance of healthy food choices with portion control discussed. Encouraged to start a food diary, count calories and to consider  joining a support group. Sample diet sheets offered. Goals set by the patient for the next several months.    

## 2014-06-17 ENCOUNTER — Ambulatory Visit (INDEPENDENT_AMBULATORY_CARE_PROVIDER_SITE_OTHER): Payer: 59 | Admitting: Otolaryngology

## 2014-06-17 ENCOUNTER — Encounter: Payer: Self-pay | Admitting: Internal Medicine

## 2014-06-17 DIAGNOSIS — R490 Dysphonia: Secondary | ICD-10-CM

## 2014-06-17 NOTE — Progress Notes (Signed)
Per Alveria Apley from [employer] they approved asst for Revlimid 06/09/14-10/09/15  10,000.00. I will let billing know and send to medical records.

## 2014-06-22 ENCOUNTER — Other Ambulatory Visit: Payer: Self-pay | Admitting: *Deleted

## 2014-06-22 DIAGNOSIS — C9000 Multiple myeloma not having achieved remission: Secondary | ICD-10-CM

## 2014-06-22 NOTE — Telephone Encounter (Signed)
THIS REFILL REQUEST FOR REVLIMID WAS GIVEN TO DR.MOHAMED'S NURSE, STEPHANIE JOHNSON,RN.

## 2014-06-23 MED ORDER — LENALIDOMIDE 15 MG PO CAPS: ORAL_CAPSULE | ORAL | Status: DC

## 2014-06-23 NOTE — Addendum Note (Signed)
Addended by: Tania Ade on: 06/23/2014 09:36 AM   Modules accepted: Orders

## 2014-06-25 ENCOUNTER — Encounter: Payer: Self-pay | Admitting: Internal Medicine

## 2014-06-25 ENCOUNTER — Other Ambulatory Visit: Payer: Self-pay | Admitting: Family Medicine

## 2014-06-25 NOTE — Progress Notes (Signed)
Debby with [employer] said this copy asst is in place. See prev notes.

## 2014-07-06 ENCOUNTER — Other Ambulatory Visit (HOSPITAL_BASED_OUTPATIENT_CLINIC_OR_DEPARTMENT_OTHER): Payer: Medicare Other

## 2014-07-06 DIAGNOSIS — C9000 Multiple myeloma not having achieved remission: Secondary | ICD-10-CM

## 2014-07-06 LAB — COMPREHENSIVE METABOLIC PANEL (CC13)
ALBUMIN: 3.1 g/dL — AB (ref 3.5–5.0)
ALK PHOS: 55 U/L (ref 40–150)
ALT: 42 U/L (ref 0–55)
AST: 27 U/L (ref 5–34)
Anion Gap: 7 mEq/L (ref 3–11)
BUN: 7.6 mg/dL (ref 7.0–26.0)
CO2: 26 mEq/L (ref 22–29)
Calcium: 9.4 mg/dL (ref 8.4–10.4)
Chloride: 106 mEq/L (ref 98–109)
Creatinine: 0.8 mg/dL (ref 0.6–1.1)
GLUCOSE: 147 mg/dL — AB (ref 70–140)
POTASSIUM: 3.4 meq/L — AB (ref 3.5–5.1)
SODIUM: 139 meq/L (ref 136–145)
Total Bilirubin: 1.2 mg/dL (ref 0.20–1.20)
Total Protein: 6.4 g/dL (ref 6.4–8.3)

## 2014-07-06 LAB — CBC WITH DIFFERENTIAL/PLATELET
BASO%: 0.3 % (ref 0.0–2.0)
Basophils Absolute: 0 10*3/uL (ref 0.0–0.1)
EOS%: 2.3 % (ref 0.0–7.0)
Eosinophils Absolute: 0.2 10*3/uL (ref 0.0–0.5)
HCT: 33.4 % — ABNORMAL LOW (ref 34.8–46.6)
HEMOGLOBIN: 10.7 g/dL — AB (ref 11.6–15.9)
LYMPH#: 0.8 10*3/uL — AB (ref 0.9–3.3)
LYMPH%: 11.7 % — ABNORMAL LOW (ref 14.0–49.7)
MCH: 31.1 pg (ref 25.1–34.0)
MCHC: 32 g/dL (ref 31.5–36.0)
MCV: 97.1 fL (ref 79.5–101.0)
MONO#: 0.4 10*3/uL (ref 0.1–0.9)
MONO%: 5.6 % (ref 0.0–14.0)
NEUT#: 5.5 10*3/uL (ref 1.5–6.5)
NEUT%: 80.1 % — ABNORMAL HIGH (ref 38.4–76.8)
NRBC: 2 % — AB (ref 0–0)
Platelets: 156 10*3/uL (ref 145–400)
RBC: 3.44 10*6/uL — AB (ref 3.70–5.45)
RDW: 15.4 % — AB (ref 11.2–14.5)
WBC: 6.8 10*3/uL (ref 3.9–10.3)

## 2014-07-06 LAB — TECHNOLOGIST REVIEW

## 2014-07-06 LAB — LACTATE DEHYDROGENASE (CC13): LDH: 174 U/L (ref 125–245)

## 2014-07-08 LAB — KAPPA/LAMBDA LIGHT CHAINS
Kappa free light chain: 1.14 mg/dL (ref 0.33–1.94)
Kappa:Lambda Ratio: 1.7 — ABNORMAL HIGH (ref 0.26–1.65)
Lambda Free Lght Chn: 0.67 mg/dL (ref 0.57–2.63)

## 2014-07-08 LAB — IGG, IGA, IGM
IGA: 1230 mg/dL — AB (ref 69–380)
IgG (Immunoglobin G), Serum: 302 mg/dL — ABNORMAL LOW (ref 690–1700)
IgM, Serum: 14 mg/dL — ABNORMAL LOW (ref 52–322)

## 2014-07-08 LAB — BETA 2 MICROGLOBULIN, SERUM: Beta-2 Microglobulin: 2.48 mg/L (ref <=2.51)

## 2014-07-13 ENCOUNTER — Telehealth: Payer: Self-pay | Admitting: Internal Medicine

## 2014-07-13 ENCOUNTER — Ambulatory Visit (HOSPITAL_BASED_OUTPATIENT_CLINIC_OR_DEPARTMENT_OTHER): Payer: Medicare Other | Admitting: Internal Medicine

## 2014-07-13 ENCOUNTER — Encounter: Payer: Self-pay | Admitting: Internal Medicine

## 2014-07-13 VITALS — BP 145/64 | HR 87 | Temp 98.3°F | Resp 18 | Ht 66.0 in | Wt 211.7 lb

## 2014-07-13 DIAGNOSIS — C9000 Multiple myeloma not having achieved remission: Secondary | ICD-10-CM | POA: Diagnosis not present

## 2014-07-13 NOTE — Progress Notes (Signed)
[place] Telephone:(336) [phone]   Fax:(336) [phone] OFFICE PROGRESS NOTE  [name], MD [address] [place] [address]  DIAGNOSIS: Stage IIA IgA kappa multiple myeloma diagnosed in 2006   PRIOR THERAPY:  1) status post treatment with thalidomide and Decadron followed by autologous peripheral blood stem cell transplant with high-dose melphalan on 04/26/2006 at [facility].  2) the patient had evidence for disease recurrence in August of 2008 and she was treated with 6 cycles of Velcade completed on 03/30/2008 with response to the treatment but was discontinued secondary to neuropathy.  3) maintenance treatment with Revlimid 10 mg by mouth daily by the does was later increase to 25 mg by mouth daily secondary to biochemical recurrence with stabilization of her disease.   CURRENT THERAPY: Revlimid 15 mg by mouth daily for 2 weeks every 3 weeks in addition to Decadron currently at 20 mg by mouth on a weekly basis.   ADVANCED DIRECTIVE: She does not have advanced directive and was given information.  INTERVAL HISTORY: Darlene Howard 63 y.o. female returns to the clinic today for routine monthly followup visit. She has no complaints today except for the low back pain. She would contact her orthopedic surgeon for consideration of steroid injections that area again. The patient denied having any fever or chills. She denied having any chest pain, shortness of breath, cough or hemoptysis. She has no weight loss or night sweats. The patient is tolerating her current treatment with Revlimid and Decadron fairly well. She had repeat myeloma panel and she is here today for evaluation and discussion of her lab results.  MEDICAL HISTORY: Past Medical History  Diagnosis Date  . Glaucoma     uses eye drops as instructed  . Personal history of colon cancer     ASCENDING COLON--  S/P  RIGHT HEMICOLECTOMY WITH NEGATIVE NODES/    NO RECURRENCE  . Herpes       takes Valtrex as needed  . GERD (gastroesophageal reflux disease)     takes Pantoprazole daily  . Hypertension     takes Amlodipine and Diovan daily  . Bruises easily     d/t being on COumadin  . History of colon polyps   . Type 2 diabetes mellitus   . Neuropathy associated with multiple myeloma     POST CHEMOTHERAPY AND STEM CELL TRANSPLANT  . H/O stem cell transplant     04-26-2006  AT [facility]  . Multiple myeloma DX  2006  STATE IIA,  IgA  KAPPA    STABLE  PER DR Naftula Donahue--  S/P STEM CELL TRANSPLANT 2007 /  RECURRENCE 2008  CHEMO ENDED 03-30-2008/   NOW ON MAINTANANCE REVLIMID )  . Vocal cord nodule     ASSESSED BY DR TEOH--  LMA  OK W/ PROCEDURE'S  . DDD (degenerative disc disease), lumbar     ALLERGIES:  is allergic to codeine.  MEDICATIONS:  Current Outpatient Prescriptions  Medication Sig Dispense Refill  . alendronate (FOSAMAX) 70 MG tablet TAKE 1 TAB EACH WEEK 30 MIN PRIOR TO BREAKFAST WITH LARGE GLASS OF WATER ON AN EMPTY STOMACH. REMAIN UPRIGHT.  4 tablet  3  . amLODipine (NORVASC) 2.5 MG tablet TAKE 1 TABLET DAILY---  takes in am      . aspirin (ASPIRIN LOW DOSE) 81 MG EC tablet Take 81 mg by mouth daily.        . bimatoprost (LUMIGAN) 0.03 % ophthalmic drops Place  1 drop into both eyes at bedtime.       . calcium-vitamin D (OSCAL 500/200 D-3) 500-200 MG-UNIT per tablet Take 1 tablet by mouth 3 (three) times daily.        Marland Kitchen dexamethasone (DECADRON) 4 MG tablet 5 tablets by mouth on a weekly basis.  20 tablet  1  . dorzolamide-timolol (COSOPT) 22.3-6.8 MG/ML ophthalmic solution Place 1 drop into both eyes 2 (two) times daily.      Marland Kitchen lenalidomide (REVLIMID) 15 MG capsule Take one capsule daily for 14 days.  Then 7 days off.  14 capsule  0  . loratadine (CLARITIN) 10 MG tablet TAKE ONE TABLET BY MOUTH ONCE DAILY.  30 tablet  4  . metFORMIN (GLUCOPHAGE) 1000 MG tablet Take 1,000 mg by mouth daily with breakfast.      . Multiple Vitamins-Minerals (CENTRUM SILVER PO) Take by  mouth daily.        Marland Kitchen olopatadine (PATANOL) 0.1 % ophthalmic solution Place 1 drop into both eyes 2 (two) times daily.  5 mL  12  . ONE TOUCH ULTRA TEST test strip USE AS DIRECTED TO CHECK DAILY.  50 each  3  . pantoprazole (PROTONIX) 40 MG tablet TAKE ONE TABLET BY MOUTH EVERY DAY FOR ACID REFLUX.--  takes in am      . potassium chloride SA (K-DUR,KLOR-CON) 20 MEQ tablet TAKE 1 TABLET BY MOUTH TWICE DAILY.  60 tablet  4  . silver sulfADIAZINE (SILVADENE) 1 % cream Apply 1 application topically daily.  50 g  0  . valACYclovir (VALTREX) 1000 MG tablet TAKE ONE TABLET BY MOUTH DAILY AS NEEDED.  30 tablet  PRN  . valsartan-hydrochlorothiazide (DIOVAN-HCT) 320-25 MG per tablet TAKE ONE TABLET BY MOUTH DAILY.  30 tablet  4  . warfarin (COUMADIN) 2 MG tablet Take 1 tablet (2 mg total) by mouth daily.  30 tablet  1   No current facility-administered medications for this visit.    SURGICAL HISTORY:  Past Surgical History  Procedure Laterality Date  . Colonoscopy    . Cervical cone biopsy    . Cataract extraction w/phaco Left 01/20/2014    Procedure: CATARACT EXTRACTION PHACO AND INTRAOCULAR LENS PLACEMENT (IOC);  Surgeon: Adonis Brook, MD;  Location: [place];  Service: Ophthalmology;  Laterality: Left;  . 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 Co2  ablation vulva perianal and vaginal dysplatic areas  02-15-2010  . Abdominal hysterectomy  1983    w/  appendectomy and left salpingoophorectom  . Hemicolectomy Right 04-27-2005    CARCINOMA   ASCENDING COLON  . Vulvectomy N/A 05/06/2014    Procedure: WIDE LOCAL EXCISION LEFT LABIA MAJORA;  Surgeon: Janie Morning, MD;  Location: [facility];  Service: Gynecology;  Laterality: N/A;  . Vulvar lesion removal N/A 05/06/2014    Procedure: LASER OF VULVAR LESION;  Surgeon: Janie Morning, MD;  Location: [facility];  Service: Gynecology;  Laterality: N/A;  . Lesion removal N/A 05/06/2014    Procedure: LASER OF THE  VAGINA;  Surgeon: Janie Morning, MD;  Location: [facility];  Service: Gynecology;  Laterality: N/A;    REVIEW OF SYSTEMS:  A comprehensive review of systems was negative.   PHYSICAL EXAMINATION: General appearance: alert, cooperative and no distress Head: Normocephalic, without obvious abnormality, atraumatic Neck: no adenopathy Lymph nodes: Cervical, supraclavicular, and axillary nodes normal. Resp: clear to auscultation bilaterally Back: symmetric, no curvature. ROM normal. No CVA tenderness. Cardio:  regular rate and rhythm, S1, S2 normal, no murmur, click, rub or gallop GI: soft, non-tender; bowel sounds normal; no masses,  no organomegaly Extremities: extremities normal, atraumatic, no cyanosis or edema Neurologic: Alert and oriented X 3, normal strength and tone. Normal symmetric reflexes. Normal coordination and gait  ECOG PERFORMANCE STATUS: 1 - Symptomatic but completely ambulatory  Blood pressure 145/64, pulse 87, temperature 98.3 F (36.8 C), temperature source Oral, resp. rate 18, height _0  (1.676 m), weight 211 lb 11.2 oz (96.026 kg).  LABORATORY DATA: Lab Results  Component Value Date   WBC 6.8 07/06/2014   HGB 10.7* 07/06/2014   HCT 33.4* 07/06/2014   MCV 97.1 07/06/2014   PLT 156 07/06/2014      Chemistry      Component Value Date/Time   NA 139 07/06/2014 1448   NA 142 05/06/2014 0707   K 3.4* 07/06/2014 1448   K 3.9 05/06/2014 0707   CL 104 02/18/2014 1217   CL 105 06/17/2013 1441   CO2 26 07/06/2014 1448   CO2 30 02/18/2014 1217   BUN 7.6 07/06/2014 1448   BUN 12 02/18/2014 1217   CREATININE 0.8 07/06/2014 1448   CREATININE 0.82 02/18/2014 1217   CREATININE 0.81 01/14/2014 1413      Component Value Date/Time   CALCIUM 9.4 07/06/2014 1448   CALCIUM 9.3 02/18/2014 1217   ALKPHOS 55 07/06/2014 1448   ALKPHOS 58 06/12/2014 0936   AST 27 07/06/2014 1448   AST 22 06/12/2014 0936   ALT 42 07/06/2014 1448   ALT 56* 06/12/2014 0936   BILITOT 1.20 07/06/2014 1448   BILITOT  1.2 06/12/2014 0936     Other lab results: Beta-2 microglobulin 2.48, free kappa light chain 1.14, free lambda light chain 0.67, kappa/lambda ratio 1.70. IgG 302, IgA 1230 and IgM 14.  RADIOGRAPHIC STUDIES: No results found.  ASSESSMENT AND PLAN: This is a very pleasant 63 years old [place] American female with history of multiple myeloma currently on treatment with Revlimid and low-dose Decadron and tolerating her treatment fairly well. Her myeloma panel showed improvement in her disease. I discussed the lab result with the patient today. I recommended for the patient to continue her current treatment with Revlimid and Decadron She would come back for followup visit in one month with repeat CBC, and comprehensive metabolic panel. She was advised to call immediately if she has any concerning symptoms and interval.  The patient voices understanding of current disease status and treatment options and is in agreement with the current care plan.  All questions were answered. The patient knows to call the clinic with any problems, questions or concerns. We can certainly see the patient much sooner if necessary.  Disclaimer: This note was dictated with voice recognition software. Similar sounding words can inadvertently be transcribed and may not be corrected upon review.

## 2014-07-13 NOTE — Telephone Encounter (Signed)
gv and printed appt sched and avs for pt for Aug °

## 2014-07-14 ENCOUNTER — Other Ambulatory Visit: Payer: Self-pay

## 2014-07-14 DIAGNOSIS — Z1231 Encounter for screening mammogram for malignant neoplasm of breast: Secondary | ICD-10-CM

## 2014-07-15 ENCOUNTER — Encounter: Payer: Self-pay | Admitting: Internal Medicine

## 2014-07-15 ENCOUNTER — Other Ambulatory Visit: Payer: Self-pay | Admitting: *Deleted

## 2014-07-15 NOTE — Progress Notes (Signed)
[employer] [facility], [id number], approved revlimid from 07/15/14-07/16/15

## 2014-07-15 NOTE — Telephone Encounter (Signed)
THIS REFILL REQUEST FOR REVLIMID WAS GIVEN TO DR.MOHAMED'S NURSE, [name] BELL,RN.

## 2014-07-16 ENCOUNTER — Encounter: Payer: Self-pay | Admitting: Family Medicine

## 2014-07-16 ENCOUNTER — Ambulatory Visit (INDEPENDENT_AMBULATORY_CARE_PROVIDER_SITE_OTHER): Payer: Medicare Other | Admitting: Family Medicine

## 2014-07-16 ENCOUNTER — Other Ambulatory Visit: Payer: Self-pay | Admitting: *Deleted

## 2014-07-16 VITALS — BP 134/74 | HR 76 | Resp 16 | Ht 66.0 in | Wt 212.1 lb

## 2014-07-16 DIAGNOSIS — M25476 Effusion, unspecified foot: Secondary | ICD-10-CM

## 2014-07-16 DIAGNOSIS — S9000XA Contusion of unspecified ankle, initial encounter: Secondary | ICD-10-CM | POA: Diagnosis not present

## 2014-07-16 DIAGNOSIS — IMO0002 Reserved for concepts with insufficient information to code with codable children: Secondary | ICD-10-CM

## 2014-07-16 DIAGNOSIS — M25473 Effusion, unspecified ankle: Secondary | ICD-10-CM | POA: Diagnosis not present

## 2014-07-16 DIAGNOSIS — C9000 Multiple myeloma not having achieved remission: Secondary | ICD-10-CM

## 2014-07-16 DIAGNOSIS — M25579 Pain in unspecified ankle and joints of unspecified foot: Secondary | ICD-10-CM | POA: Diagnosis not present

## 2014-07-16 DIAGNOSIS — M5410 Radiculopathy, site unspecified: Secondary | ICD-10-CM

## 2014-07-16 DIAGNOSIS — I10 Essential (primary) hypertension: Secondary | ICD-10-CM | POA: Diagnosis not present

## 2014-07-16 DIAGNOSIS — M25572 Pain in left ankle and joints of left foot: Principal | ICD-10-CM

## 2014-07-16 DIAGNOSIS — M25472 Effusion, left ankle: Secondary | ICD-10-CM

## 2014-07-16 DIAGNOSIS — S9030XA Contusion of unspecified foot, initial encounter: Secondary | ICD-10-CM | POA: Diagnosis not present

## 2014-07-16 MED ORDER — GABAPENTIN 100 MG PO CAPS: 100.0000 mg | ORAL_CAPSULE | Freq: Three times a day (TID) | ORAL | Status: DC

## 2014-07-16 MED ORDER — LENALIDOMIDE 15 MG PO CAPS: ORAL_CAPSULE | ORAL | Status: DC

## 2014-07-16 NOTE — Patient Instructions (Signed)
F/u as before  You are referred to orthopedics re left ankle trauma, pls limit weight bearing, ice 2 to 3 times daily to help reduce swelling    New for back pain is gabapentin  Start with one at bedtime, increase after 3 to 5 days to two at bedtime, and after an additional 3 to 5 days to three at bedtime if needed. This is a very effective medication for back pain with nerve irritation  Script says one 3 times daily, but take only at bedtime

## 2014-07-17 NOTE — Assessment & Plan Note (Signed)
Controlled, no change in medication  

## 2014-07-17 NOTE — Assessment & Plan Note (Signed)
Swelling and pain of left ankle 1 week post direct trauma, refer to ortho for further eval Symptomatic measures only at this time, ice , elevate, tylenol, reduced weight bearing

## 2014-07-17 NOTE — Progress Notes (Signed)
   Subjective:    Patient ID: Darlene Howard, female    DOB: 06/29/1951, 63 y.o.   MRN: [id number]  HPI Pt reports vacuum fell on her left foot 1 week ago, , several days later she has been experiencing increased pain and swelliing, still able to fully weight bear, and also has full ROM of the affected foot and ankle, does have some tenderness localized  However. C/o uncontrolled chronic back pain , recently called in for epidural injection repeat, appt is in August, pain is between 6 and 8 when she walks   Review of Systems See HPI Denies recent fever or chills. Denies sinus pressure, nasal congestion, ear pain or sore throat. Denies chest congestion, productive cough or wheezing. Denies chest pains, palpitations and leg swelling Denies abdominal pain, nausea, vomiting,diarrhea or constipation.   Denies dysuria, frequency, hesitancy or incontinence.  Denies skin break down or rash.        Objective:   Physical Exam BP 134/74  Pulse 76  Resp 16  Ht 5\' 6"  (1.676 m)  Wt 212 lb 1.9 oz (96.217 kg)  BMI 34.25 kg/m2  SpO2 99% Patient alert and oriented and in no cardiopulmonary distress.Pt in mild   HEENT: No facial asymmetry, EOMI,   oropharynx pink and moist.  Neck supple no JVD, no mass.  Chest: Clear to auscultation bilaterally.  CVS: S1, S2 no murmurs, no S3.Regular rate.  ABD: Soft non tender.   Ext: No edema  MS: Decreased  ROM spine, and decreased ROM left ankle which is swollen and tender on medial aspect  Skin: Intact, no ulcerations or rash noted.  Psych: Good eye contact, normal affect. Memory intact not anxious or depressed appearing.  CNS: CN 2-12 intact, power,  normal throughout.no focal deficits noted.        Assessment & Plan:  Pain and swelling of left ankle Swelling and pain of left ankle 1 week post direct trauma, refer to ortho for further eval Symptomatic measures only at this time, ice , elevate, tylenol, reduced weight bearing  Back pain  with left-sided radiculopathy Increased and uncontrolled pain, rated at 6 to 8 throughout the day, trial of gabapentin in increasing doses, has benefited in the past from epidural and has called for rept injection  HYPERTENSION Controlled, no change in medication

## 2014-07-17 NOTE — Assessment & Plan Note (Signed)
Increased and uncontrolled pain, rated at 6 to 8 throughout the day, trial of gabapentin in increasing doses, has benefited in the past from epidural and has called for rept injection

## 2014-07-19 ENCOUNTER — Telehealth: Payer: Self-pay

## 2014-07-19 DIAGNOSIS — S9030XA Contusion of unspecified foot, initial encounter: Secondary | ICD-10-CM | POA: Diagnosis not present

## 2014-07-19 NOTE — Telephone Encounter (Signed)
Thank you for the followup.

## 2014-07-19 NOTE — Telephone Encounter (Signed)
Pt was referred by Dr. Moshe Cipro for colonoscopy. She has history of colon cancer. She is not having any problems. She is on recall for 11/07/2014.  She said she will wait til then and I will have her on my recall list and give her a call. She will call if she needs anything before then.

## 2014-07-19 NOTE — Telephone Encounter (Signed)
Okay 

## 2014-07-29 ENCOUNTER — Other Ambulatory Visit: Payer: Self-pay | Admitting: *Deleted

## 2014-07-29 DIAGNOSIS — C9000 Multiple myeloma not having achieved remission: Secondary | ICD-10-CM

## 2014-07-29 MED ORDER — WARFARIN SODIUM 2 MG PO TABS: 2.0000 mg | ORAL_TABLET | Freq: Every day | ORAL | Status: DC

## 2014-08-04 ENCOUNTER — Other Ambulatory Visit: Payer: Self-pay | Admitting: *Deleted

## 2014-08-04 ENCOUNTER — Ambulatory Visit: Payer: 59

## 2014-08-04 DIAGNOSIS — C9000 Multiple myeloma not having achieved remission: Secondary | ICD-10-CM

## 2014-08-04 MED ORDER — LENALIDOMIDE 15 MG PO CAPS: ORAL_CAPSULE | ORAL | Status: DC

## 2014-08-04 MED ORDER — DEXAMETHASONE 4 MG PO TABS: ORAL_TABLET | ORAL | Status: DC

## 2014-08-05 ENCOUNTER — Ambulatory Visit
Admission: RE | Admit: 2014-08-05 | Discharge: 2014-08-05 | Disposition: A | Discharge status: Home / Self Care | Payer: Medicare Other | Source: Ambulatory Visit

## 2014-08-05 DIAGNOSIS — Z1231 Encounter for screening mammogram for malignant neoplasm of breast: Secondary | ICD-10-CM | POA: Diagnosis not present

## 2014-08-10 ENCOUNTER — Telehealth: Payer: Self-pay | Admitting: Physician Assistant

## 2014-08-10 ENCOUNTER — Other Ambulatory Visit (HOSPITAL_BASED_OUTPATIENT_CLINIC_OR_DEPARTMENT_OTHER): Payer: Medicare Other

## 2014-08-10 ENCOUNTER — Ambulatory Visit (HOSPITAL_BASED_OUTPATIENT_CLINIC_OR_DEPARTMENT_OTHER): Payer: Medicare Other | Admitting: Physician Assistant

## 2014-08-10 VITALS — BP 150/75 | HR 80 | Temp 98.0°F | Resp 18 | Ht 66.0 in | Wt 217.2 lb

## 2014-08-10 DIAGNOSIS — C9000 Multiple myeloma not having achieved remission: Secondary | ICD-10-CM

## 2014-08-10 LAB — CBC WITH DIFFERENTIAL/PLATELET
BASO%: 0.5 % (ref 0.0–2.0)
BASOS ABS: 0 10*3/uL (ref 0.0–0.1)
EOS%: 1.5 % (ref 0.0–7.0)
Eosinophils Absolute: 0.1 10*3/uL (ref 0.0–0.5)
HCT: 31.8 % — ABNORMAL LOW (ref 34.8–46.6)
HEMOGLOBIN: 10.3 g/dL — AB (ref 11.6–15.9)
LYMPH#: 1.1 10*3/uL (ref 0.9–3.3)
LYMPH%: 13.2 % — ABNORMAL LOW (ref 14.0–49.7)
MCH: 31 pg (ref 25.1–34.0)
MCHC: 32.4 g/dL (ref 31.5–36.0)
MCV: 95.8 fL (ref 79.5–101.0)
MONO#: 0.9 10*3/uL (ref 0.1–0.9)
MONO%: 10.5 % (ref 0.0–14.0)
NEUT#: 6.4 10*3/uL (ref 1.5–6.5)
NEUT%: 74.3 % (ref 38.4–76.8)
Platelets: 121 10*3/uL — ABNORMAL LOW (ref 145–400)
RBC: 3.32 10*6/uL — ABNORMAL LOW (ref 3.70–5.45)
RDW: 15.3 % — ABNORMAL HIGH (ref 11.2–14.5)
WBC: 8.6 10*3/uL (ref 3.9–10.3)
nRBC: 2 % — ABNORMAL HIGH (ref 0–0)

## 2014-08-10 LAB — COMPREHENSIVE METABOLIC PANEL (CC13)
ALK PHOS: 52 U/L (ref 40–150)
ALT: 24 U/L (ref 0–55)
ANION GAP: 16 meq/L — AB (ref 3–11)
AST: 20 U/L (ref 5–34)
Albumin: 3.2 g/dL — ABNORMAL LOW (ref 3.5–5.0)
BUN: 12.8 mg/dL (ref 7.0–26.0)
CO2: 26 mEq/L (ref 22–29)
CREATININE: 0.8 mg/dL (ref 0.6–1.1)
Calcium: 9.1 mg/dL (ref 8.4–10.4)
Chloride: 105 mEq/L (ref 98–109)
Glucose: 123 mg/dl (ref 70–140)
Potassium: 3.7 mEq/L (ref 3.5–5.1)
SODIUM: 146 meq/L — AB (ref 136–145)
TOTAL PROTEIN: 6.1 g/dL — AB (ref 6.4–8.3)
Total Bilirubin: 1.42 mg/dL — ABNORMAL HIGH (ref 0.20–1.20)

## 2014-08-10 LAB — TECHNOLOGIST REVIEW

## 2014-08-10 NOTE — Telephone Encounter (Signed)
Pt confirmed labs/ov per 08/11 POF, gave pt AVS..Marland KitchenKJ

## 2014-08-16 DIAGNOSIS — M5137 Other intervertebral disc degeneration, lumbosacral region: Secondary | ICD-10-CM | POA: Diagnosis not present

## 2014-08-16 DIAGNOSIS — M48061 Spinal stenosis, lumbar region without neurogenic claudication: Secondary | ICD-10-CM | POA: Diagnosis not present

## 2014-08-16 DIAGNOSIS — IMO0002 Reserved for concepts with insufficient information to code with codable children: Secondary | ICD-10-CM | POA: Diagnosis not present

## 2014-08-17 NOTE — Patient Instructions (Signed)
Continue your Revlimid and dexamethasone at current doses Followup in one month for another symptom management visit

## 2014-08-17 NOTE — Progress Notes (Signed)
[place] Telephone:(336) [phone]   Fax:(336) [phone] OFFICE PROGRESS NOTE  [name], MD [address], [address] [place] [address]  DIAGNOSIS: Stage IIA IgA kappa multiple myeloma diagnosed in 2006   PRIOR THERAPY:  1) status post treatment with thalidomide and Decadron followed by autologous peripheral blood stem cell transplant with high-dose melphalan on 04/26/2006 at [facility].  2) the patient had evidence for disease recurrence in August of 2008 and she was treated with 6 cycles of Velcade completed on 03/30/2008 with response to the treatment but was discontinued secondary to neuropathy.  3) maintenance treatment with Revlimid 10 mg by mouth daily by the does was later increase to 25 mg by mouth daily secondary to biochemical recurrence with stabilization of her disease.   CURRENT THERAPY: Revlimid 15 mg by mouth daily for 2 weeks every 3 weeks in addition to Decadron currently at 20 mg by mouth on a weekly basis.   ADVANCED DIRECTIVE: She does not have advanced directive and was given information.  INTERVAL HISTORY: Darlene Howard 63 y.o. female returns to the clinic today for routine monthly followup visit. She has no complaints today except for low back pain and swelling of her feet and ankles. She does report recent increased sodium intake related to salty foods. She did see er orthopedic surgeon for a steroid injection in  that area again. The patient denied having any fever or chills. She denied having any chest pain, shortness of breath, cough or hemoptysis. She has no weight loss or night sweats. The patient is tolerating her current treatment with Revlimid and Decadron fairly well. She will begin her next cycle of Revlimid on 08/11/2014.  MEDICAL HISTORY: Past Medical History  Diagnosis Date  . Glaucoma     uses eye drops as instructed  . Personal history of colon cancer     ASCENDING COLON--  S/P  RIGHT HEMICOLECTOMY WITH  NEGATIVE NODES/    NO RECURRENCE  . Herpes     takes Valtrex as needed  . GERD (gastroesophageal reflux disease)     takes Pantoprazole daily  . Hypertension     takes Amlodipine and Diovan daily  . Bruises easily     d/t being on COumadin  . History of colon polyps   . Type 2 diabetes mellitus   . Neuropathy associated with multiple myeloma     POST CHEMOTHERAPY AND STEM CELL TRANSPLANT  . H/O stem cell transplant     04-26-2006  AT [facility]  . Multiple myeloma DX  2006  STATE IIA,  IgA  KAPPA    STABLE  PER DR MOHAMED--  S/P STEM CELL TRANSPLANT 2007 /  RECURRENCE 2008  CHEMO ENDED 03-30-2008/   NOW ON MAINTANANCE REVLIMID )  . Vocal cord nodule     ASSESSED BY DR TEOH--  LMA  OK W/ PROCEDURE'S  . DDD (degenerative disc disease), lumbar     ALLERGIES:  is allergic to codeine.  MEDICATIONS:  Current Outpatient Prescriptions  Medication Sig Dispense Refill  . alendronate (FOSAMAX) 70 MG tablet TAKE 1 TAB EACH WEEK 30 MIN PRIOR TO BREAKFAST WITH LARGE GLASS OF WATER ON AN EMPTY STOMACH. REMAIN UPRIGHT.  4 tablet  3  . amLODipine (NORVASC) 2.5 MG tablet TAKE 1 TABLET DAILY---  takes in am      . aspirin (ASPIRIN LOW DOSE) 81 MG EC tablet Take 81 mg by mouth daily.        Marland Kitchen  bimatoprost (LUMIGAN) 0.03 % ophthalmic drops Place 1 drop into both eyes at bedtime.       . calcium-vitamin D (OSCAL 500/200 D-3) 500-200 MG-UNIT per tablet Take 1 tablet by mouth 3 (three) times daily.        Marland Kitchen dexamethasone (DECADRON) 4 MG tablet 5 tablets by mouth on a weekly basis.  20 tablet  2  . dorzolamide-timolol (COSOPT) 22.3-6.8 MG/ML ophthalmic solution Place 1 drop into both eyes 2 (two) times daily.      Marland Kitchen gabapentin (NEURONTIN) 100 MG capsule Take 1 capsule (100 mg total) by mouth 3 (three) times daily.  90 capsule  3  . lenalidomide (REVLIMID) 15 MG capsule Take one capsule daily for 14 days.  Then 7 days off.  14 capsule  0  . loratadine (CLARITIN) 10 MG tablet TAKE ONE TABLET BY MOUTH ONCE DAILY.   30 tablet  4  . metFORMIN (GLUCOPHAGE) 1000 MG tablet Take 1,000 mg by mouth daily with breakfast.      . Multiple Vitamins-Minerals (CENTRUM SILVER PO) Take by mouth daily.        . ONE TOUCH ULTRA TEST test strip USE AS DIRECTED TO CHECK DAILY.  50 each  3  . pantoprazole (PROTONIX) 40 MG tablet TAKE ONE TABLET BY MOUTH EVERY DAY FOR ACID REFLUX.--  takes in am      . potassium chloride SA (K-DUR,KLOR-CON) 20 MEQ tablet TAKE 1 TABLET BY MOUTH TWICE DAILY.  60 tablet  4  . valACYclovir (VALTREX) 1000 MG tablet TAKE ONE TABLET BY MOUTH DAILY AS NEEDED.  30 tablet  PRN  . valsartan-hydrochlorothiazide (DIOVAN-HCT) 320-25 MG per tablet TAKE ONE TABLET BY MOUTH DAILY.  30 tablet  4  . warfarin (COUMADIN) 2 MG tablet Take 1 tablet (2 mg total) by mouth daily.  30 tablet  1   No current facility-administered medications for this visit.    SURGICAL HISTORY:  Past Surgical History  Procedure Laterality Date  . Colonoscopy    . Cervical cone biopsy    . Cataract extraction w/phaco Left 01/20/2014    Procedure: CATARACT EXTRACTION PHACO AND INTRAOCULAR LENS PLACEMENT (IOC);  Surgeon: Adonis Brook, MD;  Location: [place];  Service: Ophthalmology;  Laterality: Left;  . 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 Co2  ablation vulva perianal and vaginal dysplatic areas  02-15-2010  . Abdominal hysterectomy  1983    w/  appendectomy and left salpingoophorectom  . Hemicolectomy Right 04-27-2005    CARCINOMA   ASCENDING COLON  . Vulvectomy N/A 05/06/2014    Procedure: WIDE LOCAL EXCISION LEFT LABIA MAJORA;  Surgeon: Janie Morning, MD;  Location: [facility]  Service: Gynecology;  Laterality: N/A;  . Vulvar lesion removal N/A 05/06/2014    Procedure: LASER OF VULVAR LESION;  Surgeon: Janie Morning, MD;  Location: [facility];  Service: Gynecology;  Laterality: N/A;  . Lesion removal N/A 05/06/2014    Procedure: LASER OF THE VAGINA;  Surgeon: Janie Morning, MD;   Location: [facility];  Service: Gynecology;  Laterality: N/A;    REVIEW OF SYSTEMS:  A comprehensive review of systems was negative.   PHYSICAL EXAMINATION: General appearance: alert, cooperative and no distress Head: Normocephalic, without obvious abnormality, atraumatic Neck: no adenopathy Lymph nodes: Cervical, supraclavicular, and axillary nodes normal. Resp: clear to auscultation bilaterally Back: symmetric, no curvature. ROM normal. No CVA tenderness. Cardio: regular rate and rhythm, S1, S2 normal, no murmur, click,  rub or gallop GI: soft, non-tender; bowel sounds normal; no masses,  no organomegaly Extremities: extremities normal, atraumatic, no cyanosis or edema Neurologic: Alert and oriented X 3, normal strength and tone. Normal symmetric reflexes. Normal coordination and gait  ECOG PERFORMANCE STATUS: 1 - Symptomatic but completely ambulatory  Blood pressure 150/75, pulse 80, temperature 98 F (36.7 C), temperature source Oral, resp. rate 18, height _0  (1.676 m), weight 217 lb 3.2 oz (98.521 kg).  LABORATORY DATA: Lab Results  Component Value Date   WBC 8.6 08/10/2014   HGB 10.3* 08/10/2014   HCT 31.8* 08/10/2014   MCV 95.8 08/10/2014   PLT 121* 08/10/2014      Chemistry      Component Value Date/Time   NA 146* 08/10/2014 1459   NA 142 05/06/2014 0707   K 3.7 08/10/2014 1459   K 3.9 05/06/2014 0707   CL 104 02/18/2014 1217   CL 105 06/17/2013 1441   CO2 26 08/10/2014 1459   CO2 30 02/18/2014 1217   BUN 12.8 08/10/2014 1459   BUN 12 02/18/2014 1217   CREATININE 0.8 08/10/2014 1459   CREATININE 0.82 02/18/2014 1217   CREATININE 0.81 01/14/2014 1413      Component Value Date/Time   CALCIUM 9.1 08/10/2014 1459   CALCIUM 9.3 02/18/2014 1217   ALKPHOS 52 08/10/2014 1459   ALKPHOS 58 06/12/2014 0936   AST 20 08/10/2014 1459   AST 22 06/12/2014 0936   ALT 24 08/10/2014 1459   ALT 56* 06/12/2014 0936   BILITOT 1.42* 08/10/2014 1459   BILITOT 1.2 06/12/2014 0936      Other lab results: Beta-2 microglobulin 2.48, free kappa light chain 1.14, free lambda light chain 0.67, kappa/lambda ratio 1.70. IgG 302, IgA 1230 and IgM 14.  RADIOGRAPHIC STUDIES: No results found.  ASSESSMENT AND PLAN: This is a very pleasant 63 years old [place] American female with history of multiple myeloma currently on treatment with Revlimid and low-dose Decadron and tolerating her treatment fairly well. Her myeloma panel showed improvement in her disease. She will continue her current treatment with Revlimid and Decadron She will follow up in one month with repeat CBC, and comprehensive metabolic panel. She was advised to call immediately if she has any concerning symptoms and interval.  The patient voices understanding of current disease status and treatment options and is in agreement with the current care plan.  All questions were answered. The patient knows to call the clinic with any problems, questions or concerns. We can certainly see the patient much sooner if necessary.  Carlton Adam, PA-C   Disclaimer: This note was dictated with voice recognition software. Similar sounding words can inadvertently be transcribed and may not be corrected upon review.

## 2014-08-24 ENCOUNTER — Other Ambulatory Visit: Payer: Self-pay | Admitting: *Deleted

## 2014-08-24 DIAGNOSIS — C9000 Multiple myeloma not having achieved remission: Secondary | ICD-10-CM

## 2014-08-24 MED ORDER — LENALIDOMIDE 15 MG PO CAPS: ORAL_CAPSULE | ORAL | Status: DC

## 2014-08-24 NOTE — Addendum Note (Signed)
Addended by: Wyonia Hough on: 08/24/2014 04:01 PM   Modules accepted: Orders

## 2014-08-24 NOTE — Telephone Encounter (Signed)
THIS REFILL REQUEST FOR REVLIMID WAS PLACED ON DR.MOHAMED'S DESK.

## 2014-09-01 ENCOUNTER — Telehealth: Payer: Self-pay | Admitting: Family Medicine

## 2014-09-01 DIAGNOSIS — E119 Type 2 diabetes mellitus without complications: Secondary | ICD-10-CM

## 2014-09-01 NOTE — Telephone Encounter (Signed)
Referral entered  

## 2014-09-07 ENCOUNTER — Other Ambulatory Visit: Payer: Self-pay | Admitting: Physician Assistant

## 2014-09-07 ENCOUNTER — Encounter: Payer: Self-pay | Admitting: Internal Medicine

## 2014-09-07 ENCOUNTER — Other Ambulatory Visit (HOSPITAL_BASED_OUTPATIENT_CLINIC_OR_DEPARTMENT_OTHER): Payer: Medicare Other

## 2014-09-07 ENCOUNTER — Ambulatory Visit (HOSPITAL_BASED_OUTPATIENT_CLINIC_OR_DEPARTMENT_OTHER): Payer: Medicare Other | Admitting: Internal Medicine

## 2014-09-07 ENCOUNTER — Telehealth: Payer: Self-pay | Admitting: Internal Medicine

## 2014-09-07 VITALS — BP 121/66 | HR 84 | Temp 98.1°F | Resp 19 | Ht 66.0 in | Wt 212.3 lb

## 2014-09-07 DIAGNOSIS — C9000 Multiple myeloma not having achieved remission: Secondary | ICD-10-CM

## 2014-09-07 LAB — CBC WITH DIFFERENTIAL/PLATELET
BASO%: 0.5 % (ref 0.0–2.0)
BASOS ABS: 0 10*3/uL (ref 0.0–0.1)
EOS%: 3.1 % (ref 0.0–7.0)
Eosinophils Absolute: 0.2 10*3/uL (ref 0.0–0.5)
HEMATOCRIT: 34.3 % — AB (ref 34.8–46.6)
HEMOGLOBIN: 10.9 g/dL — AB (ref 11.6–15.9)
LYMPH#: 0.8 10*3/uL — AB (ref 0.9–3.3)
LYMPH%: 13.9 % — AB (ref 14.0–49.7)
MCH: 31.2 pg (ref 25.1–34.0)
MCHC: 31.8 g/dL (ref 31.5–36.0)
MCV: 98.3 fL (ref 79.5–101.0)
MONO#: 0.4 10*3/uL (ref 0.1–0.9)
MONO%: 6.3 % (ref 0.0–14.0)
NEUT#: 4.5 10*3/uL (ref 1.5–6.5)
NEUT%: 76.2 % (ref 38.4–76.8)
PLATELETS: 156 10*3/uL (ref 145–400)
RBC: 3.49 10*6/uL — ABNORMAL LOW (ref 3.70–5.45)
RDW: 15.4 % — ABNORMAL HIGH (ref 11.2–14.5)
WBC: 5.9 10*3/uL (ref 3.9–10.3)
nRBC: 4 % — ABNORMAL HIGH (ref 0–0)

## 2014-09-07 LAB — COMPREHENSIVE METABOLIC PANEL (CC13)
ALBUMIN: 3.2 g/dL — AB (ref 3.5–5.0)
ALK PHOS: 58 U/L (ref 40–150)
ALT: 25 U/L (ref 0–55)
AST: 15 U/L (ref 5–34)
Anion Gap: 9 mEq/L (ref 3–11)
BUN: 12.4 mg/dL (ref 7.0–26.0)
CHLORIDE: 107 meq/L (ref 98–109)
CO2: 28 mEq/L (ref 22–29)
CREATININE: 0.9 mg/dL (ref 0.6–1.1)
Calcium: 9.6 mg/dL (ref 8.4–10.4)
Glucose: 101 mg/dl (ref 70–140)
Potassium: 4.1 mEq/L (ref 3.5–5.1)
Sodium: 144 mEq/L (ref 136–145)
Total Bilirubin: 1 mg/dL (ref 0.20–1.20)
Total Protein: 6.9 g/dL (ref 6.4–8.3)

## 2014-09-07 LAB — TECHNOLOGIST REVIEW

## 2014-09-07 LAB — LACTATE DEHYDROGENASE (CC13): LDH: 228 U/L (ref 125–245)

## 2014-09-07 NOTE — Progress Notes (Signed)
[place] Telephone:(336) [phone]   Fax:(336) [phone] OFFICE PROGRESS NOTE  [name], MD [address], [address] [place] [address]  DIAGNOSIS: Stage IIA IgA kappa multiple myeloma diagnosed in 2006   PRIOR THERAPY:  1) status post treatment with thalidomide and Decadron followed by autologous peripheral blood stem cell transplant with high-dose melphalan on 04/26/2006 at [facility].  2) the patient had evidence for disease recurrence in August of 2008 and she was treated with 6 cycles of Velcade completed on 03/30/2008 with response to the treatment but was discontinued secondary to neuropathy.  3) maintenance treatment with Revlimid 10 mg by mouth daily by the does was later increase to 25 mg by mouth daily secondary to biochemical recurrence with stabilization of her disease.   CURRENT THERAPY: Revlimid 15 mg by mouth daily for 2 weeks every 3 weeks in addition to Decadron currently at 20 mg by mouth on a weekly basis.   ADVANCED DIRECTIVE: She does not have advanced directive and was given information.  INTERVAL HISTORY: Darlene Howard 63 y.o. female returns to the clinic today for routine monthly followup visit. She has no complaints today with no complaints. The patient denied having any fever or chills. She denied having any chest pain, shortness of breath, cough or hemoptysis. She has no weight loss or night sweats. The patient is tolerating her current treatment with Revlimid and Decadron fairly well.  MEDICAL HISTORY: Past Medical History  Diagnosis Date  . Glaucoma     uses eye drops as instructed  . Personal history of colon cancer     ASCENDING COLON--  S/P  RIGHT HEMICOLECTOMY WITH NEGATIVE NODES/    NO RECURRENCE  . Herpes     takes Valtrex as needed  . GERD (gastroesophageal reflux disease)     takes Pantoprazole daily  . Hypertension     takes Amlodipine and Diovan daily  . Bruises easily     d/t being on COumadin  .  History of colon polyps   . Type 2 diabetes mellitus   . Neuropathy associated with multiple myeloma     POST CHEMOTHERAPY AND STEM CELL TRANSPLANT  . H/O stem cell transplant     04-26-2006  AT [facility]  . Multiple myeloma DX  2006  STATE IIA,  IgA  KAPPA    STABLE  PER DR Clarabel Marion--  S/P STEM CELL TRANSPLANT 2007 /  RECURRENCE 2008  CHEMO ENDED 03-30-2008/   NOW ON MAINTANANCE REVLIMID )  . Vocal cord nodule     ASSESSED BY DR TEOH--  LMA  OK W/ PROCEDURE'S  . DDD (degenerative disc disease), lumbar     ALLERGIES:  is allergic to codeine.  MEDICATIONS:  Current Outpatient Prescriptions  Medication Sig Dispense Refill  . alendronate (FOSAMAX) 70 MG tablet TAKE 1 TAB EACH WEEK 30 MIN PRIOR TO BREAKFAST WITH LARGE GLASS OF WATER ON AN EMPTY STOMACH. REMAIN UPRIGHT.  4 tablet  3  . amLODipine (NORVASC) 2.5 MG tablet TAKE 1 TABLET DAILY---  takes in am      . aspirin (ASPIRIN LOW DOSE) 81 MG EC tablet Take 81 mg by mouth daily.        . bimatoprost (LUMIGAN) 0.03 % ophthalmic drops Place 1 drop into both eyes at bedtime.       . calcium-vitamin D (OSCAL 500/200 D-3) 500-200 MG-UNIT per tablet Take 1 tablet by mouth 3 (three) times daily.        Marland Kitchen  dexamethasone (DECADRON) 4 MG tablet 5 tablets by mouth on a weekly basis.  20 tablet  2  . dorzolamide-timolol (COSOPT) 22.3-6.8 MG/ML ophthalmic solution Place 1 drop into both eyes 2 (two) times daily.      Marland Kitchen gabapentin (NEURONTIN) 100 MG capsule Take 1 capsule (100 mg total) by mouth 3 (three) times daily.  90 capsule  3  . lenalidomide (REVLIMID) 15 MG capsule Take one capsule daily for 14 days.  Then 7 days off.  14 capsule  0  . loratadine (CLARITIN) 10 MG tablet TAKE ONE TABLET BY MOUTH ONCE DAILY.  30 tablet  4  . metFORMIN (GLUCOPHAGE) 1000 MG tablet Take 1,000 mg by mouth daily with breakfast.      . Multiple Vitamins-Minerals (CENTRUM SILVER PO) Take by mouth daily.        . ONE TOUCH ULTRA TEST test strip USE AS DIRECTED TO CHECK DAILY.   50 each  3  . pantoprazole (PROTONIX) 40 MG tablet TAKE ONE TABLET BY MOUTH EVERY DAY FOR ACID REFLUX.--  takes in am      . potassium chloride SA (K-DUR,KLOR-CON) 20 MEQ tablet TAKE 1 TABLET BY MOUTH TWICE DAILY.  60 tablet  4  . valACYclovir (VALTREX) 1000 MG tablet TAKE ONE TABLET BY MOUTH DAILY AS NEEDED.  30 tablet  PRN  . valsartan-hydrochlorothiazide (DIOVAN-HCT) 320-25 MG per tablet TAKE ONE TABLET BY MOUTH DAILY.  30 tablet  4  . warfarin (COUMADIN) 2 MG tablet Take 1 tablet (2 mg total) by mouth daily.  30 tablet  1   No current facility-administered medications for this visit.    SURGICAL HISTORY:  Past Surgical History  Procedure Laterality Date  . Colonoscopy    . Cervical cone biopsy    . Cataract extraction w/phaco Left 01/20/2014    Procedure: CATARACT EXTRACTION PHACO AND INTRAOCULAR LENS PLACEMENT (IOC);  Surgeon: Adonis Brook, MD;  Location: [place];  Service: Ophthalmology;  Laterality: Left;  . 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 Co2  ablation vulva perianal and vaginal dysplatic areas  02-15-2010  . Abdominal hysterectomy  1983    w/  appendectomy and left salpingoophorectom  . Hemicolectomy Right 04-27-2005    CARCINOMA   ASCENDING COLON  . Vulvectomy N/A 05/06/2014    Procedure: WIDE LOCAL EXCISION LEFT LABIA MAJORA;  Surgeon: Janie Morning, MD;  Location: [facility];  Service: Gynecology;  Laterality: N/A;  . Vulvar lesion removal N/A 05/06/2014    Procedure: LASER OF VULVAR LESION;  Surgeon: Janie Morning, MD;  Location: [facility];  Service: Gynecology;  Laterality: N/A;  . Lesion removal N/A 05/06/2014    Procedure: LASER OF THE VAGINA;  Surgeon: Janie Morning, MD;  Location: [facility];  Service: Gynecology;  Laterality: N/A;    REVIEW OF SYSTEMS:  A comprehensive review of systems was negative.   PHYSICAL EXAMINATION: General appearance: alert, cooperative and no distress Head: Normocephalic,  without obvious abnormality, atraumatic Neck: no adenopathy Lymph nodes: Cervical, supraclavicular, and axillary nodes normal. Resp: clear to auscultation bilaterally Back: symmetric, no curvature. ROM normal. No CVA tenderness. Cardio: regular rate and rhythm, S1, S2 normal, no murmur, click, rub or gallop GI: soft, non-tender; bowel sounds normal; no masses,  no organomegaly Extremities: extremities normal, atraumatic, no cyanosis or edema Neurologic: Alert and oriented X 3, normal strength and tone. Normal symmetric reflexes. Normal coordination and gait  ECOG PERFORMANCE STATUS: 1 - Symptomatic but  completely ambulatory  Blood pressure 121/66, pulse 84, temperature 98.1 F (36.7 C), temperature source Oral, resp. rate 19, height '5\' 6"'  (1.676 m), weight 212 lb 4.8 oz (96.299 kg), SpO2 100.00%.  LABORATORY DATA: Lab Results  Component Value Date   WBC 5.9 09/07/2014   HGB 10.9* 09/07/2014   HCT 34.3* 09/07/2014   MCV 98.3 09/07/2014   PLT 156 09/07/2014      Chemistry      Component Value Date/Time   NA 144 09/07/2014 1442   NA 142 05/06/2014 0707   K 4.1 09/07/2014 1442   K 3.9 05/06/2014 0707   CL 104 02/18/2014 1217   CL 105 06/17/2013 1441   CO2 28 09/07/2014 1442   CO2 30 02/18/2014 1217   BUN 12.4 09/07/2014 1442   BUN 12 02/18/2014 1217   CREATININE 0.9 09/07/2014 1442   CREATININE 0.82 02/18/2014 1217   CREATININE 0.81 01/14/2014 1413      Component Value Date/Time   CALCIUM 9.6 09/07/2014 1442   CALCIUM 9.3 02/18/2014 1217   ALKPHOS 58 09/07/2014 1442   ALKPHOS 58 06/12/2014 0936   AST 15 09/07/2014 1442   AST 22 06/12/2014 0936   ALT 25 09/07/2014 1442   ALT 56* 06/12/2014 0936   BILITOT 1.00 09/07/2014 1442   BILITOT 1.2 06/12/2014 0936     Other lab results in July of 2015: Beta-2 microglobulin 2.48, free kappa light chain 1.14, free lambda light chain 0.67, kappa/lambda ratio 1.70. IgG 302, IgA 1230 and IgM 14.  RADIOGRAPHIC STUDIES: No results found.  ASSESSMENT AND PLAN: This is a  very pleasant 63 years old [place] American female with history of multiple myeloma currently on treatment with Revlimid and low-dose Decadron and tolerating her treatment fairly well. Her CBC and comprehensive metabolic panel today are stable. I discussed the lab result with the patient today. I recommended for the patient to continue her current treatment with Revlimid and Decadron She would come back for followup visit in one month with repeat CBC, and comprehensive metabolic panel and myeloma panel. She was advised to call immediately if she has any concerning symptoms and interval.  The patient voices understanding of current disease status and treatment options and is in agreement with the current care plan.  All questions were answered. The patient knows to call the clinic with any problems, questions or concerns. We can certainly see the patient much sooner if necessary.  Disclaimer: This note was dictated with voice recognition software. Similar sounding words can inadvertently be transcribed and may not be corrected upon review.

## 2014-09-07 NOTE — Telephone Encounter (Signed)
[facility] PT APPT SCHEDULE FOR OCT

## 2014-09-10 ENCOUNTER — Other Ambulatory Visit: Payer: Self-pay | Admitting: *Deleted

## 2014-09-10 NOTE — Telephone Encounter (Signed)
THIS REFILL REQUEST FOR REVLIMID WAS GIVEN TO DR.MOHAMED'S NURSE, STEPHANIE JOHNSON,RN.

## 2014-09-13 ENCOUNTER — Other Ambulatory Visit: Payer: Self-pay | Admitting: *Deleted

## 2014-09-13 DIAGNOSIS — C9000 Multiple myeloma not having achieved remission: Secondary | ICD-10-CM

## 2014-09-13 MED ORDER — LENALIDOMIDE 15 MG PO CAPS: ORAL_CAPSULE | ORAL | Status: DC

## 2014-09-14 DIAGNOSIS — M5137 Other intervertebral disc degeneration, lumbosacral region: Secondary | ICD-10-CM | POA: Diagnosis not present

## 2014-09-14 DIAGNOSIS — M5126 Other intervertebral disc displacement, lumbar region: Secondary | ICD-10-CM | POA: Diagnosis not present

## 2014-09-14 DIAGNOSIS — M48061 Spinal stenosis, lumbar region without neurogenic claudication: Secondary | ICD-10-CM | POA: Diagnosis not present

## 2014-09-14 DIAGNOSIS — IMO0002 Reserved for concepts with insufficient information to code with codable children: Secondary | ICD-10-CM | POA: Diagnosis not present

## 2014-09-20 DIAGNOSIS — J209 Acute bronchitis, unspecified: Secondary | ICD-10-CM | POA: Diagnosis not present

## 2014-09-23 ENCOUNTER — Other Ambulatory Visit: Payer: Self-pay | Admitting: *Deleted

## 2014-09-23 ENCOUNTER — Other Ambulatory Visit: Payer: Self-pay | Admitting: Family Medicine

## 2014-09-23 DIAGNOSIS — H25049 Posterior subcapsular polar age-related cataract, unspecified eye: Secondary | ICD-10-CM | POA: Diagnosis not present

## 2014-09-23 DIAGNOSIS — E119 Type 2 diabetes mellitus without complications: Secondary | ICD-10-CM | POA: Diagnosis not present

## 2014-09-23 DIAGNOSIS — C9000 Multiple myeloma not having achieved remission: Secondary | ICD-10-CM

## 2014-09-23 DIAGNOSIS — H4011X0 Primary open-angle glaucoma, stage unspecified: Secondary | ICD-10-CM | POA: Diagnosis not present

## 2014-09-23 LAB — HM DIABETES EYE EXAM

## 2014-09-23 MED ORDER — WARFARIN SODIUM 2 MG PO TABS
2.0000 mg | ORAL_TABLET | Freq: Every day | ORAL | Status: DC
Start: 2014-09-23 — End: 2014-09-27

## 2014-09-24 ENCOUNTER — Other Ambulatory Visit: Payer: Self-pay | Admitting: Medical Oncology

## 2014-09-24 DIAGNOSIS — C9000 Multiple myeloma not having achieved remission: Secondary | ICD-10-CM

## 2014-09-24 MED ORDER — DEXAMETHASONE 4 MG PO TABS
ORAL_TABLET | ORAL | Status: DC
Start: 2014-09-24 — End: 2014-09-27

## 2014-09-24 NOTE — Telephone Encounter (Signed)
Refill for warfarin done per [facility] and decadron also

## 2014-09-27 ENCOUNTER — Other Ambulatory Visit: Payer: Self-pay | Admitting: Medical Oncology

## 2014-09-27 ENCOUNTER — Other Ambulatory Visit: Payer: Self-pay

## 2014-09-27 ENCOUNTER — Telehealth: Payer: Self-pay | Admitting: Family Medicine

## 2014-09-27 DIAGNOSIS — M5410 Radiculopathy, site unspecified: Secondary | ICD-10-CM

## 2014-09-27 DIAGNOSIS — C9000 Multiple myeloma not having achieved remission: Secondary | ICD-10-CM

## 2014-09-27 MED ORDER — GABAPENTIN 100 MG PO CAPS
100.0000 mg | ORAL_CAPSULE | Freq: Three times a day (TID) | ORAL | Status: DC
Start: 2014-09-27 — End: 2015-05-28

## 2014-09-27 MED ORDER — DEXAMETHASONE 4 MG PO TABS: ORAL_TABLET | ORAL | Status: DC

## 2014-09-27 MED ORDER — METFORMIN HCL 1000 MG PO TABS: 1000.0000 mg | ORAL_TABLET | Freq: Every day | ORAL | Status: DC

## 2014-09-27 MED ORDER — PANTOPRAZOLE SODIUM 40 MG PO TBEC: DELAYED_RELEASE_TABLET | ORAL | Status: DC

## 2014-09-27 MED ORDER — AMLODIPINE BESYLATE 2.5 MG PO TABS: ORAL_TABLET | ORAL | Status: DC

## 2014-09-27 MED ORDER — WARFARIN SODIUM 2 MG PO TABS
2.0000 mg | ORAL_TABLET | Freq: Every day | ORAL | Status: DC
Start: 2014-09-27 — End: 2014-12-01

## 2014-09-27 MED ORDER — POTASSIUM CHLORIDE CRYS ER 20 MEQ PO TBCR: EXTENDED_RELEASE_TABLET | ORAL | Status: DC

## 2014-09-27 MED ORDER — VALACYCLOVIR HCL 1 G PO TABS: ORAL_TABLET | ORAL | Status: DC

## 2014-09-27 MED ORDER — VALSARTAN-HYDROCHLOROTHIAZIDE 320-25 MG PO TABS: ORAL_TABLET | ORAL | Status: DC

## 2014-09-27 MED ORDER — ALENDRONATE SODIUM 70 MG PO TABS: ORAL_TABLET | ORAL | Status: DC

## 2014-09-27 NOTE — Telephone Encounter (Signed)
Cancelled decadron adn coumadin refill at [facility] and sent to [facility]

## 2014-09-27 NOTE — Telephone Encounter (Signed)
Med sent in.

## 2014-10-01 ENCOUNTER — Other Ambulatory Visit (HOSPITAL_BASED_OUTPATIENT_CLINIC_OR_DEPARTMENT_OTHER): Payer: Medicare Other

## 2014-10-01 DIAGNOSIS — C9000 Multiple myeloma not having achieved remission: Secondary | ICD-10-CM | POA: Diagnosis not present

## 2014-10-01 LAB — CBC WITH DIFFERENTIAL/PLATELET
BASO%: 0.5 % (ref 0.0–2.0)
Basophils Absolute: 0 10*3/uL (ref 0.0–0.1)
EOS%: 2.4 % (ref 0.0–7.0)
Eosinophils Absolute: 0.2 10*3/uL (ref 0.0–0.5)
HCT: 33.7 % — ABNORMAL LOW (ref 34.8–46.6)
HGB: 10.8 g/dL — ABNORMAL LOW (ref 11.6–15.9)
LYMPH%: 10.8 % — ABNORMAL LOW (ref 14.0–49.7)
MCH: 30.9 pg (ref 25.1–34.0)
MCHC: 32 g/dL (ref 31.5–36.0)
MCV: 96.3 fL (ref 79.5–101.0)
MONO#: 0.7 10*3/uL (ref 0.1–0.9)
MONO%: 8.2 % (ref 0.0–14.0)
NEUT%: 78.1 % — ABNORMAL HIGH (ref 38.4–76.8)
NEUTROS ABS: 6.9 10*3/uL — AB (ref 1.5–6.5)
NRBC: 2 % — AB (ref 0–0)
Platelets: 155 10*3/uL (ref 145–400)
RBC: 3.5 10*6/uL — AB (ref 3.70–5.45)
RDW: 16.1 % — AB (ref 11.2–14.5)
WBC: 8.9 10*3/uL (ref 3.9–10.3)
lymph#: 1 10*3/uL (ref 0.9–3.3)

## 2014-10-01 LAB — COMPREHENSIVE METABOLIC PANEL (CC13)
ALK PHOS: 66 U/L (ref 40–150)
ALT: 32 U/L (ref 0–55)
AST: 22 U/L (ref 5–34)
Albumin: 3.2 g/dL — ABNORMAL LOW (ref 3.5–5.0)
Anion Gap: 9 mEq/L (ref 3–11)
BILIRUBIN TOTAL: 0.99 mg/dL (ref 0.20–1.20)
BUN: 7.2 mg/dL (ref 7.0–26.0)
CO2: 24 mEq/L (ref 22–29)
Calcium: 9.7 mg/dL (ref 8.4–10.4)
Chloride: 108 mEq/L (ref 98–109)
Creatinine: 0.8 mg/dL (ref 0.6–1.1)
GLUCOSE: 110 mg/dL (ref 70–140)
Potassium: 3.6 mEq/L (ref 3.5–5.1)
SODIUM: 141 meq/L (ref 136–145)
Total Protein: 7 g/dL (ref 6.4–8.3)

## 2014-10-01 LAB — LACTATE DEHYDROGENASE (CC13): LDH: 179 U/L (ref 125–245)

## 2014-10-01 LAB — TECHNOLOGIST REVIEW

## 2014-10-05 LAB — IGG, IGA, IGM
IGA: 1640 mg/dL — AB (ref 69–380)
IGG (IMMUNOGLOBIN G), SERUM: 314 mg/dL — AB (ref 690–1700)
IgM, Serum: 12 mg/dL — ABNORMAL LOW (ref 52–322)

## 2014-10-05 LAB — BETA 2 MICROGLOBULIN, SERUM: Beta-2 Microglobulin: 2.89 mg/L — ABNORMAL HIGH (ref <=2.51)

## 2014-10-05 LAB — KAPPA/LAMBDA LIGHT CHAINS
Kappa free light chain: 1.4 mg/dL (ref 0.33–1.94)
Kappa:Lambda Ratio: 2.15 — ABNORMAL HIGH (ref 0.26–1.65)
LAMBDA FREE LGHT CHN: 0.65 mg/dL (ref 0.57–2.63)

## 2014-10-06 ENCOUNTER — Encounter: Payer: Self-pay | Admitting: Internal Medicine

## 2014-10-06 ENCOUNTER — Ambulatory Visit (HOSPITAL_BASED_OUTPATIENT_CLINIC_OR_DEPARTMENT_OTHER): Payer: Medicare Other | Admitting: Internal Medicine

## 2014-10-06 ENCOUNTER — Telehealth: Payer: Self-pay | Admitting: Internal Medicine

## 2014-10-06 VITALS — BP 142/66 | HR 90 | Temp 98.4°F | Resp 18 | Ht 66.0 in | Wt 214.4 lb

## 2014-10-06 DIAGNOSIS — C9000 Multiple myeloma not having achieved remission: Secondary | ICD-10-CM | POA: Diagnosis not present

## 2014-10-06 DIAGNOSIS — Z23 Encounter for immunization: Secondary | ICD-10-CM | POA: Diagnosis not present

## 2014-10-06 MED ORDER — INFLUENZA VAC SPLIT QUAD 0.5 ML IM SUSY: 0.5000 mL | PREFILLED_SYRINGE | INTRAMUSCULAR | Status: DC

## 2014-10-06 MED ORDER — INFLUENZA VAC SPLIT QUAD 0.5 ML IM SUSY
0.5000 mL | PREFILLED_SYRINGE | Freq: Once | INTRAMUSCULAR | Status: AC
  Administered 2014-10-06: 0.5 mL via INTRAMUSCULAR
  Filled 2014-10-06: qty 0.5

## 2014-10-06 NOTE — Progress Notes (Signed)
[place] Telephone:([phone]) [phone]   Fax:([phone]) [phone] OFFICE PROGRESS NOTE  [name], MD [address], [address] [place] [address]  DIAGNOSIS: Stage IIA IgA kappa multiple myeloma diagnosed in 2006   PRIOR THERAPY:  1) status post treatment with thalidomide and Decadron followed by autologous peripheral blood stem cell transplant with high-dose melphalan on 04/26/2006 at [facility].  2) the patient had evidence for disease recurrence in August of 2008 and she was treated with 6 cycles of Velcade completed on 03/30/2008 with response to the treatment but was discontinued secondary to neuropathy.  3) maintenance treatment with Revlimid 10 mg by mouth daily by the does was later increase to 25 mg by mouth daily secondary to biochemical recurrence with stabilization of her disease.   CURRENT THERAPY: Revlimid 15 mg by mouth daily for 2 weeks every 3 weeks in addition to Decadron currently at 20 mg by mouth on a weekly basis.   ADVANCED DIRECTIVE: She does not have advanced directive and was given information.  INTERVAL HISTORY: Darlene Howard 63 y.o. female returns to the clinic today for routine monthly followup visit. She is feeling fine today with no specific complaints except for left shoulder pain started a few days ago. She has a history of trauma to that shoulder several years ago. She is to have treatment with Revlimid and Decadron fairly well with no significant adverse effects. The patient denied having any fever or chills. She denied having any chest pain, shortness of breath, cough or hemoptysis. She has no weight loss or night sweats. She had repeat myeloma panel performed recently and she is here for evaluation and discussion of her lab results.  MEDICAL HISTORY: Past Medical History  Diagnosis Date  . Glaucoma     uses eye drops as instructed  . Personal history of colon cancer     ASCENDING COLON--  S/P  RIGHT HEMICOLECTOMY WITH  NEGATIVE NODES/    NO RECURRENCE  . Herpes     takes Valtrex as needed  . GERD (gastroesophageal reflux disease)     takes Pantoprazole daily  . Hypertension     takes Amlodipine and Diovan daily  . Bruises easily     d/t being on COumadin  . History of colon polyps   . Type 2 diabetes mellitus   . Neuropathy associated with multiple myeloma     POST CHEMOTHERAPY AND STEM CELL TRANSPLANT  . H/O stem cell transplant     04-26-2006  AT [facility]  . Multiple myeloma DX  2006  STATE IIA,  IgA  KAPPA    STABLE  PER DR Danyeal Akens--  S/P STEM CELL TRANSPLANT 2007 /  RECURRENCE 2008  CHEMO ENDED 03-30-2008/   NOW ON MAINTANANCE REVLIMID )  . Vocal cord nodule     ASSESSED BY DR TEOH--  LMA  OK W/ PROCEDURE'S  . DDD (degenerative disc disease), lumbar     ALLERGIES:  is allergic to codeine.  MEDICATIONS:  Current Outpatient Prescriptions  Medication Sig Dispense Refill  . alendronate (FOSAMAX) 70 MG tablet TAKE 1 TAB EACH WEEK 30 MIN PRIOR TO BREAKFAST WITH LARGE GLASS OF WATER ON AN EMPTY STOMACH. REMAIN UPRIGHT.  12 tablet  1  . amLODipine (NORVASC) 2.5 MG tablet TAKE 1 TABLET DAILY---  takes in am  90 tablet  1  . aspirin (ASPIRIN LOW DOSE) 81 MG EC tablet Take 81 mg by mouth daily.        Marland Kitchen  bimatoprost (LUMIGAN) 0.03 % ophthalmic drops Place 1 drop into both eyes at bedtime.       . calcium-vitamin D (OSCAL 500/200 D-3) 500-200 MG-UNIT per tablet Take 1 tablet by mouth 3 (three) times daily.        Marland Kitchen dexamethasone (DECADRON) 4 MG tablet 5 tablets by mouth on a weekly basis.  20 tablet  2  . dorzolamide-timolol (COSOPT) 22.3-6.8 MG/ML ophthalmic solution Place 1 drop into both eyes 2 (two) times daily.      Marland Kitchen gabapentin (NEURONTIN) 100 MG capsule Take 1 capsule (100 mg total) by mouth 3 (three) times daily.  270 capsule  1  . lenalidomide (REVLIMID) 15 MG capsule Take one capsule daily for 14 days.  Then 7 days off.  14 capsule  0  . loratadine (CLARITIN) 10 MG tablet TAKE ONE TABLET BY MOUTH  ONCE DAILY.  30 tablet  2  . metFORMIN (GLUCOPHAGE) 1000 MG tablet Take 1 tablet (1,000 mg total) by mouth daily with breakfast.  90 tablet  1  . Multiple Vitamins-Minerals (CENTRUM SILVER PO) Take by mouth daily.        . ONE TOUCH ULTRA TEST test strip USE AS DIRECTED TO CHECK DAILY.  50 each  3  . pantoprazole (PROTONIX) 40 MG tablet TAKE ONE TABLET BY MOUTH EVERY DAY FOR ACID REFLUX.  90 tablet  1  . potassium chloride SA (K-DUR,KLOR-CON) 20 MEQ tablet TAKE 1 TABLET BY MOUTH TWICE DAILY.  180 tablet  1  . valACYclovir (VALTREX) 1000 MG tablet TAKE ONE TABLET BY MOUTH DAILY AS NEEDED.  90 tablet  1  . valsartan-hydrochlorothiazide (DIOVAN-HCT) 320-25 MG per tablet TAKE ONE TABLET BY MOUTH DAILY.  90 tablet  1  . warfarin (COUMADIN) 2 MG tablet Take 1 tablet (2 mg total) by mouth daily.  30 tablet  0   No current facility-administered medications for this visit.    SURGICAL HISTORY:  Past Surgical History  Procedure Laterality Date  . Colonoscopy    . Cervical cone biopsy    . Cataract extraction w/phaco Left 01/20/2014    Procedure: CATARACT EXTRACTION PHACO AND INTRAOCULAR LENS PLACEMENT (IOC);  Surgeon: Adonis Brook, MD;  Location: [place];  Service: Ophthalmology;  Laterality: Left;  . 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 Co2  ablation vulva perianal and vaginal dysplatic areas  02-15-2010  . Abdominal hysterectomy  1983    w/  appendectomy and left salpingoophorectom  . Hemicolectomy Right 04-27-2005    CARCINOMA   ASCENDING COLON  . Vulvectomy N/A 05/06/2014    Procedure: WIDE LOCAL EXCISION LEFT LABIA MAJORA;  Surgeon: Janie Morning, MD;  Location: [facility];  Service: Gynecology;  Laterality: N/A;  . Vulvar lesion removal N/A 05/06/2014    Procedure: LASER OF VULVAR LESION;  Surgeon: Janie Morning, MD;  Location: [facility];  Service: Gynecology;  Laterality: N/A;  . Lesion removal N/A 05/06/2014    Procedure: LASER OF THE VAGINA;   Surgeon: Janie Morning, MD;  Location: [facility];  Service: Gynecology;  Laterality: N/A;    REVIEW OF SYSTEMS:  Constitutional: negative Eyes: negative Ears, nose, mouth, throat, and face: negative Respiratory: negative Cardiovascular: negative Gastrointestinal: negative Genitourinary:negative Integument/breast: negative Hematologic/lymphatic: negative Musculoskeletal:positive for Left shoulder pain Neurological: negative Behavioral/Psych: negative Endocrine: negative Allergic/Immunologic: negative   PHYSICAL EXAMINATION: General appearance: alert, cooperative and no distress Head: Normocephalic, without obvious abnormality, atraumatic Neck: no adenopathy Lymph nodes: Cervical, supraclavicular,  and axillary nodes normal. Resp: clear to auscultation bilaterally Back: symmetric, no curvature. ROM normal. No CVA tenderness. Cardio: regular rate and rhythm, S1, S2 normal, no murmur, click, rub or gallop GI: soft, non-tender; bowel sounds normal; no masses,  no organomegaly Extremities: extremities normal, atraumatic, no cyanosis or edema Neurologic: Alert and oriented X 3, normal strength and tone. Normal symmetric reflexes. Normal coordination and gait  ECOG PERFORMANCE STATUS: 1 - Symptomatic but completely ambulatory  Blood pressure 142/66, pulse 90, temperature 98.4 F (36.9 C), temperature source Oral, resp. rate 18, height '5\' 6"'  (1.676 m), weight 214 lb 6.4 oz (97.251 kg), SpO2 100.00%.  LABORATORY DATA: Lab Results  Component Value Date   WBC 8.9 10/01/2014   HGB 10.8* 10/01/2014   HCT 33.7* 10/01/2014   MCV 96.3 10/01/2014   PLT 155 10/01/2014      Chemistry      Component Value Date/Time   NA 141 10/01/2014 1436   NA 142 05/06/2014 0707   K 3.6 10/01/2014 1436   K 3.9 05/06/2014 0707   CL 104 02/18/2014 1217   CL 105 06/17/2013 1441   CO2 24 10/01/2014 1436   CO2 30 02/18/2014 1217   BUN 7.2 10/01/2014 1436   BUN 12 02/18/2014 1217   CREATININE 0.8  10/01/2014 1436   CREATININE 0.82 02/18/2014 1217   CREATININE 0.81 01/14/2014 1413      Component Value Date/Time   CALCIUM 9.7 10/01/2014 1436   CALCIUM 9.3 02/18/2014 1217   ALKPHOS 66 10/01/2014 1436   ALKPHOS 58 06/12/2014 0936   AST 22 10/01/2014 1436   AST 22 06/12/2014 0936   ALT 32 10/01/2014 1436   ALT 56* 06/12/2014 0936   BILITOT 0.99 10/01/2014 1436   BILITOT 1.2 06/12/2014 0936     Other lab results in July of 2015: Beta-2 microglobulin 2.89, free kappa light chain 1.40, free lambda light chain 0.65, kappa/lambda ratio 2.15. IgG 314, IgA 1640 and IgM 12.  RADIOGRAPHIC STUDIES: No results found.  ASSESSMENT AND PLAN: This is a very pleasant 63 years old [place] American female with history of multiple myeloma currently on treatment with Revlimid and low-dose Decadron and tolerating her treatment fairly well. The patient myeloma panel showed no significant evidence for disease progression. I discussed the lab result with the patient today. I recommended for the patient to continue her current treatment with Revlimid and Decadron. For the left shoulder pain, I will order x-ray of the left shoulder to rule out any lytic lesions or other abnormality. She would come back for followup visit in one month with repeat CBC, and comprehensive metabolic panel. She was advised to call immediately if she has any concerning symptoms and interval.  The patient voices understanding of current disease status and treatment options and is in agreement with the current care plan.  All questions were answered. The patient knows to call the clinic with any problems, questions or concerns. We can certainly see the patient much sooner if necessary.  Disclaimer: This note was dictated with voice recognition software. Similar sounding words can inadvertently be transcribed and may not be corrected upon review.

## 2014-10-06 NOTE — Telephone Encounter (Signed)
gv and printed appt sched and avs for pt for NOV °

## 2014-10-07 ENCOUNTER — Other Ambulatory Visit: Payer: Self-pay | Admitting: *Deleted

## 2014-10-07 NOTE — Telephone Encounter (Signed)
THIS REFILL REQUEST FOR REVLIMID WAS PLACED ON DR.MOHAMED'S DESK.

## 2014-10-08 ENCOUNTER — Encounter: Payer: Self-pay | Admitting: Internal Medicine

## 2014-10-08 ENCOUNTER — Other Ambulatory Visit: Payer: Self-pay | Admitting: *Deleted

## 2014-10-08 DIAGNOSIS — C9000 Multiple myeloma not having achieved remission: Secondary | ICD-10-CM

## 2014-10-08 MED ORDER — LENALIDOMIDE 15 MG PO CAPS: ORAL_CAPSULE | ORAL | Status: DC

## 2014-10-08 NOTE — Telephone Encounter (Signed)
Faxed to [facility].

## 2014-10-12 ENCOUNTER — Other Ambulatory Visit: Payer: Self-pay | Admitting: Medical Oncology

## 2014-10-12 ENCOUNTER — Telehealth: Payer: Self-pay | Admitting: Medical Oncology

## 2014-10-12 NOTE — Telephone Encounter (Signed)
err

## 2014-10-12 NOTE — Progress Notes (Signed)
erro

## 2014-10-21 ENCOUNTER — Other Ambulatory Visit: Payer: Self-pay | Admitting: *Deleted

## 2014-10-21 DIAGNOSIS — C9000 Multiple myeloma not having achieved remission: Secondary | ICD-10-CM

## 2014-10-22 ENCOUNTER — Ambulatory Visit (HOSPITAL_COMMUNITY)
Admission: RE | Admit: 2014-10-22 | Discharge: 2014-10-22 | Disposition: A | Discharge status: Home / Self Care | Payer: Medicare Other | Source: Ambulatory Visit | Attending: Internal Medicine | Admitting: Internal Medicine

## 2014-10-22 DIAGNOSIS — M19012 Primary osteoarthritis, left shoulder: Secondary | ICD-10-CM | POA: Diagnosis not present

## 2014-10-22 DIAGNOSIS — C9000 Multiple myeloma not having achieved remission: Secondary | ICD-10-CM | POA: Insufficient documentation

## 2014-10-22 DIAGNOSIS — M25512 Pain in left shoulder: Secondary | ICD-10-CM | POA: Insufficient documentation

## 2014-10-22 DIAGNOSIS — M19022 Primary osteoarthritis, left elbow: Secondary | ICD-10-CM | POA: Diagnosis not present

## 2014-10-26 ENCOUNTER — Encounter: Payer: Self-pay | Admitting: Internal Medicine

## 2014-10-26 NOTE — Progress Notes (Signed)
PAN asst with Revlimid thru 06/09/2015  [id number]

## 2014-10-27 ENCOUNTER — Encounter: Payer: Self-pay | Admitting: Internal Medicine

## 2014-10-27 DIAGNOSIS — E119 Type 2 diabetes mellitus without complications: Secondary | ICD-10-CM | POA: Diagnosis not present

## 2014-10-27 DIAGNOSIS — I10 Essential (primary) hypertension: Secondary | ICD-10-CM | POA: Diagnosis not present

## 2014-10-27 LAB — COMPLETE METABOLIC PANEL WITH GFR
ALK PHOS: 62 U/L (ref 39–117)
ALT: 34 U/L (ref 0–35)
AST: 33 U/L (ref 0–37)
Albumin: 3.8 g/dL (ref 3.5–5.2)
BILIRUBIN TOTAL: 1.4 mg/dL — AB (ref 0.2–1.2)
BUN: 13 mg/dL (ref 6–23)
CO2: 27 mEq/L (ref 19–32)
Calcium: 9.6 mg/dL (ref 8.4–10.5)
Chloride: 103 mEq/L (ref 96–112)
Creat: 0.73 mg/dL (ref 0.50–1.10)
GFR, Est African American: >89 mL/min
GFR, Est Non African American: 87 mL/min
Glucose, Bld: 140 mg/dL — ABNORMAL HIGH (ref 70–99)
Potassium: 3.9 mEq/L (ref 3.5–5.3)
SODIUM: 140 meq/L (ref 135–145)
TOTAL PROTEIN: 7.2 g/dL (ref 6.0–8.3)

## 2014-10-27 LAB — LIPID PANEL
CHOLESTEROL: 141 mg/dL (ref 0–200)
HDL: 55 mg/dL (ref >39)
LDL CALC: 58 mg/dL (ref 0–99)
Total CHOL/HDL Ratio: 2.6 Ratio
Triglycerides: 139 mg/dL (ref <150)
VLDL: 28 mg/dL (ref 0–40)

## 2014-10-27 LAB — HEMOGLOBIN A1C
HEMOGLOBIN A1C: 6.5 % — AB (ref <5.7)
Mean Plasma Glucose: 140 mg/dL — ABNORMAL HIGH (ref <117)

## 2014-10-28 ENCOUNTER — Other Ambulatory Visit: Payer: Self-pay | Admitting: *Deleted

## 2014-10-28 NOTE — Telephone Encounter (Signed)
THIS REFILL REQUEST FOR REVLIMID WAS GIVEN TO DR.MOHAMED'S NURSE, [name] BELL,RN.

## 2014-10-29 ENCOUNTER — Other Ambulatory Visit: Payer: Self-pay | Admitting: Medical Oncology

## 2014-10-29 DIAGNOSIS — C9000 Multiple myeloma not having achieved remission: Secondary | ICD-10-CM

## 2014-10-29 MED ORDER — LENALIDOMIDE 15 MG PO CAPS: ORAL_CAPSULE | ORAL | Status: DC

## 2014-11-01 ENCOUNTER — Encounter: Payer: Self-pay | Admitting: Family Medicine

## 2014-11-01 ENCOUNTER — Ambulatory Visit (INDEPENDENT_AMBULATORY_CARE_PROVIDER_SITE_OTHER): Payer: Medicare Other | Admitting: Family Medicine

## 2014-11-01 VITALS — BP 130/84 | HR 88 | Resp 18 | Ht 66.0 in | Wt 214.1 lb

## 2014-11-01 DIAGNOSIS — D010 Carcinoma in situ of colon: Secondary | ICD-10-CM

## 2014-11-01 DIAGNOSIS — I10 Essential (primary) hypertension: Secondary | ICD-10-CM | POA: Diagnosis not present

## 2014-11-01 DIAGNOSIS — M25512 Pain in left shoulder: Secondary | ICD-10-CM

## 2014-11-01 DIAGNOSIS — E119 Type 2 diabetes mellitus without complications: Secondary | ICD-10-CM

## 2014-11-01 DIAGNOSIS — Z13220 Encounter for screening for lipoid disorders: Secondary | ICD-10-CM

## 2014-11-01 DIAGNOSIS — M25519 Pain in unspecified shoulder: Secondary | ICD-10-CM | POA: Insufficient documentation

## 2014-11-01 DIAGNOSIS — K219 Gastro-esophageal reflux disease without esophagitis: Secondary | ICD-10-CM

## 2014-11-01 DIAGNOSIS — H409 Unspecified glaucoma: Secondary | ICD-10-CM

## 2014-11-01 DIAGNOSIS — J302 Other seasonal allergic rhinitis: Secondary | ICD-10-CM | POA: Diagnosis not present

## 2014-11-01 DIAGNOSIS — Z79899 Other long term (current) drug therapy: Secondary | ICD-10-CM

## 2014-11-01 MED ORDER — FLUTICASONE PROPIONATE 50 MCG/ACT NA SUSP: 2.0000 | Freq: Every day | NASAL | Status: DC

## 2014-11-01 NOTE — Patient Instructions (Signed)
Annual wellness in 4 mnth, call if you need me before  New for allergies is flonase  Please get colonoscopy this year as planned  You are referred to Dr Percell Miller re shoulder    Take gabapentin as directed, first one, then 2 then up to 3 at nigth for pain, but orthopedics is what you really need for the shoulder  Recent labs are excellent  Fasting lipid, cmp and EGFr, hBa1C in 4 month

## 2014-11-01 NOTE — Progress Notes (Signed)
   Subjective:    Patient ID: Darlene Howard, female    DOB: April 05, 1951, 63 y.o.   MRN: [id number]  HPI The PT is here for follow up and re-evaluation of chronic medical conditions, medication management and review of any available recent lab and radiology data.  Preventive health is updated, specifically  Cancer screening and Immunization.Plans to have colonoscopy before year end, which is due. Glaucoma is doing well   Questions or concerns regarding consultations or procedures which the PT has had in the interim are  Addressed.Being followed closely by oncology, still being treated ,and has decided to reduce exposure to potential illness by restricting social contacts, she is comfortable with this as she has a close family. Increased left shoulder pain with reduced mobility over past 6 months, xray significant for extensive arthritis, will need ortho management The PT denies any adverse reactions to current medications since the last visit.  There are no specific complaints  ,Denies polyuria, polydipsia, blurred vision , or hypoglycemic episodes.      Review of Systems See HPI Denies recent fever or chills. Denies sinus pressure, nasal congestion, ear pain or sore throat. Denies chest congestion, productive cough or wheezing. Denies chest pains, palpitations and leg swelling Denies abdominal pain, nausea, vomiting,diarrhea or constipation.   Denies dysuria, frequency, hesitancy or incontinence.  Denies headaches, seizures, numbness, or tingling. Denies depression, anxiety or insomnia. Denies skin break down or rash.         Objective:   Physical Exam BP 130/84 mmHg  Pulse 88  Resp 18  Ht 5\' 6"  (1.676 m)  Wt 214 lb 1.3 oz (97.106 kg)  BMI 34.57 kg/m2  SpO2 97% Patient alert and oriented and in no cardiopulmonary distress.  HEENT: No facial asymmetry, EOMI,   oropharynx pink and moist.  Neck supple no JVD, no mass.  Chest: Clear to auscultation bilaterally.  CVS: S1,  S2 no murmurs, no S3.Regular rate.  ABD: Soft non tender.   Ext: No edema  MS: Adequate ROM spine,  hips and knees.markedly reduced ROM left shoulder, pt has to physically move the extremity with the help of her right extremity  Skin: Intact, no ulcerations or rash noted.  Psych: Good eye contact, normal affect. Memory intact not anxious or depressed appearing.  CNS: CN 2-12 intact, power,  normal throughout.no focal deficits noted.        Assessment & Plan:

## 2014-11-02 DIAGNOSIS — S46012A Strain of muscle(s) and tendon(s) of the rotator cuff of left shoulder, initial encounter: Secondary | ICD-10-CM | POA: Diagnosis not present

## 2014-11-02 DIAGNOSIS — K219 Gastro-esophageal reflux disease without esophagitis: Secondary | ICD-10-CM | POA: Insufficient documentation

## 2014-11-02 NOTE — Assessment & Plan Note (Signed)
Controlled, no change in medication Patient advised to reduce carb and sweets, commit to regular physical activity, take meds as prescribed, test blood as directed, and attempt to lose weight, to improve blood sugar control.  

## 2014-11-02 NOTE — Assessment & Plan Note (Signed)
Controlled, no change in medication  

## 2014-11-02 NOTE — Assessment & Plan Note (Signed)
Controlled, no change in medication DASH diet and commitment to daily physical activity for a minimum of 30 minutes discussed and encouraged, as a part of hypertension management. The importance of attaining a healthy weight is also discussed.  

## 2014-11-02 NOTE — Assessment & Plan Note (Signed)
More symptomatic which is common during this season. Flonase prescribed

## 2014-11-02 NOTE — Assessment & Plan Note (Signed)
Marked debility with restricted mobility of left shoulder , abn xray significant for extensive arthritis, refer to ortho

## 2014-11-02 NOTE — Assessment & Plan Note (Signed)
Repeat colonoscopy by year end of 2015

## 2014-11-02 NOTE — Assessment & Plan Note (Signed)
Reports good control, sees optalmologist every 6 months

## 2014-11-03 ENCOUNTER — Encounter: Payer: Self-pay | Admitting: Internal Medicine

## 2014-11-03 ENCOUNTER — Ambulatory Visit (HOSPITAL_BASED_OUTPATIENT_CLINIC_OR_DEPARTMENT_OTHER): Payer: Medicare Other | Admitting: Internal Medicine

## 2014-11-03 ENCOUNTER — Telehealth: Payer: Self-pay | Admitting: Internal Medicine

## 2014-11-03 ENCOUNTER — Other Ambulatory Visit (HOSPITAL_BASED_OUTPATIENT_CLINIC_OR_DEPARTMENT_OTHER): Payer: Medicare Other

## 2014-11-03 VITALS — BP 140/73 | HR 84 | Temp 98.4°F | Resp 18 | Ht 66.0 in | Wt 212.0 lb

## 2014-11-03 DIAGNOSIS — C9000 Multiple myeloma not having achieved remission: Secondary | ICD-10-CM | POA: Diagnosis not present

## 2014-11-03 LAB — COMPREHENSIVE METABOLIC PANEL (CC13)
ALK PHOS: 68 U/L (ref 40–150)
ALT: 27 U/L (ref 0–55)
AST: 16 U/L (ref 5–34)
Albumin: 3.3 g/dL — ABNORMAL LOW (ref 3.5–5.0)
Anion Gap: 8 mEq/L (ref 3–11)
BUN: 11.8 mg/dL (ref 7.0–26.0)
CALCIUM: 9.6 mg/dL (ref 8.4–10.4)
CO2: 27 mEq/L (ref 22–29)
Chloride: 106 mEq/L (ref 98–109)
Creatinine: 0.9 mg/dL (ref 0.6–1.1)
Glucose: 119 mg/dl (ref 70–140)
Potassium: 4.2 mEq/L (ref 3.5–5.1)
SODIUM: 141 meq/L (ref 136–145)
TOTAL PROTEIN: 7.2 g/dL (ref 6.4–8.3)
Total Bilirubin: 0.94 mg/dL (ref 0.20–1.20)

## 2014-11-03 LAB — CBC WITH DIFFERENTIAL/PLATELET
BASO%: 0.4 % (ref 0.0–2.0)
BASOS ABS: 0 10*3/uL (ref 0.0–0.1)
EOS%: 1.2 % (ref 0.0–7.0)
Eosinophils Absolute: 0.1 10*3/uL (ref 0.0–0.5)
HEMATOCRIT: 32.9 % — AB (ref 34.8–46.6)
HEMOGLOBIN: 10.5 g/dL — AB (ref 11.6–15.9)
LYMPH#: 1.4 10*3/uL (ref 0.9–3.3)
LYMPH%: 15.5 % (ref 14.0–49.7)
MCH: 30.9 pg (ref 25.1–34.0)
MCHC: 31.9 g/dL (ref 31.5–36.0)
MCV: 96.8 fL (ref 79.5–101.0)
MONO#: 1.1 10*3/uL — ABNORMAL HIGH (ref 0.1–0.9)
MONO%: 12 % (ref 0.0–14.0)
NEUT#: 6.5 10*3/uL (ref 1.5–6.5)
NEUT%: 70.9 % (ref 38.4–76.8)
Platelets: 133 10*3/uL — ABNORMAL LOW (ref 145–400)
RBC: 3.4 10*6/uL — ABNORMAL LOW (ref 3.70–5.45)
RDW: 15.8 % — AB (ref 11.2–14.5)
WBC: 9.1 10*3/uL (ref 3.9–10.3)
nRBC: 1 % — ABNORMAL HIGH (ref 0–0)

## 2014-11-03 LAB — LACTATE DEHYDROGENASE (CC13): LDH: 162 U/L (ref 125–245)

## 2014-11-03 NOTE — Telephone Encounter (Signed)
gv and printed appt sched and avs for pt for Dec

## 2014-11-03 NOTE — Progress Notes (Signed)
[place] [phone]   Fax:(336) [phone] OFFICE PROGRESS NOTE  Darlene Nakayama, Darlene Howard [address] [place] [address]  DIAGNOSIS: Stage IIA IgA kappa multiple myeloma diagnosed in 2006   PRIOR THERAPY:  1) status post treatment with thalidomide and Decadron followed by autologous peripheral blood stem cell transplant with high-dose melphalan on 04/26/2006 at [facility].  2) the patient had evidence for disease recurrence in August of 2008 and she was treated with 6 cycles of Velcade completed on 03/30/2008 with response to the treatment but was discontinued secondary to neuropathy.  3) maintenance treatment with Revlimid 10 mg by mouth daily by the does was later increase to 25 mg by mouth daily secondary to biochemical recurrence with stabilization of her disease.   CURRENT THERAPY: Revlimid 15 mg by mouth daily for 2 weeks every 3 weeks in addition to Decadron currently at 20 mg by mouth on a weekly basis.   ADVANCED DIRECTIVE: She does not have advanced directive and was given information.  INTERVAL HISTORY: Darlene Howard 63 y.o. female returns to the clinic today for routine monthly followup visit. She is feeling fine today with no specific complaints except for left shoulder pain started a few days ago. She received a steroid injection into the left shoulder area by her orthopedic surgeon few days ago. She still has pain in that area. She is tolerating her treatment with Revlimid and Decadron fairly well with no significant adverse effects. The patient denied having any fever or chills. She denied having any chest pain, shortness of breath, cough or hemoptysis. She has no weight loss or night sweats. She has repeat CBC, comprehensive metabolic panel and LDH performed earlier today and she is here for evaluation and discussion of her lab results.  MEDICAL HISTORY: Past Medical History  Diagnosis Date  . Glaucoma     uses eye  drops as instructed  . Personal history of colon cancer     ASCENDING COLON--  S/P  RIGHT HEMICOLECTOMY WITH NEGATIVE NODES/    NO RECURRENCE  . Herpes     takes Valtrex as needed  . GERD (gastroesophageal reflux disease)     takes Pantoprazole daily  . Hypertension     takes Amlodipine and Diovan daily  . Bruises easily     d/t being on COumadin  . History of colon polyps   . Type 2 diabetes mellitus   . Neuropathy associated with multiple myeloma     POST CHEMOTHERAPY AND STEM CELL TRANSPLANT  . H/O stem cell transplant     04-26-2006  AT [facility]  . Multiple myeloma DX  2006  STATE IIA,  IgA  KAPPA    STABLE  PER DR Rashay Barnette--  S/P STEM CELL TRANSPLANT 2007 /  RECURRENCE 2008  CHEMO ENDED 03-30-2008/   NOW ON MAINTANANCE REVLIMID )  . Vocal cord nodule     ASSESSED BY DR TEOH--  LMA  OK W/ PROCEDURE'S  . DDD (degenerative disc disease), lumbar     ALLERGIES:  is allergic to codeine.  MEDICATIONS:  Current Outpatient Prescriptions  Medication Sig Dispense Refill  . alendronate (FOSAMAX) 70 MG tablet TAKE 1 TAB EACH WEEK 30 MIN PRIOR TO BREAKFAST WITH LARGE GLASS OF WATER ON AN EMPTY STOMACH. REMAIN UPRIGHT. 12 tablet 1  . amLODipine (NORVASC) 2.5 MG tablet TAKE 1 TABLET DAILY---  takes in am 90 tablet 1  . aspirin (ASPIRIN LOW DOSE) 81 MG  EC tablet Take 81 mg by mouth daily.      . bimatoprost (LUMIGAN) 0.03 % ophthalmic drops Place 1 drop into both eyes at bedtime.     . calcium-vitamin D (OSCAL 500/200 D-3) 500-200 MG-UNIT per tablet Take 1 tablet by mouth 3 (three) times daily.      Marland Kitchen dexamethasone (DECADRON) 4 MG tablet 5 tablets by mouth on a weekly basis. 20 tablet 2  . dorzolamide-timolol (COSOPT) 22.3-6.8 MG/ML ophthalmic solution Place 1 drop into both eyes 2 (two) times daily.    Marland Kitchen gabapentin (NEURONTIN) 100 MG capsule Take 1 capsule (100 mg total) by mouth 3 (three) times daily. 270 capsule 1  . lenalidomide (REVLIMID) 15 MG capsule Take one capsule daily for 14 days.   Then 7 days off. 14 capsule 0  . metFORMIN (GLUCOPHAGE) 1000 MG tablet Take 1 tablet (1,000 mg total) by mouth daily with breakfast. 90 tablet 1  . Multiple Vitamins-Minerals (CENTRUM SILVER PO) Take by mouth daily.      . ONE TOUCH ULTRA TEST test strip USE AS DIRECTED TO CHECK DAILY. 50 each 3  . pantoprazole (PROTONIX) 40 MG tablet TAKE ONE TABLET BY MOUTH EVERY DAY FOR ACID REFLUX. 90 tablet 1  . potassium chloride SA (K-DUR,KLOR-CON) 20 MEQ tablet TAKE 1 TABLET BY MOUTH TWICE DAILY. 180 tablet 1  . valACYclovir (VALTREX) 1000 MG tablet TAKE ONE TABLET BY MOUTH DAILY AS NEEDED. 90 tablet 1  . valsartan-hydrochlorothiazide (DIOVAN-HCT) 320-25 MG per tablet TAKE ONE TABLET BY MOUTH DAILY. 90 tablet 1  . warfarin (COUMADIN) 2 MG tablet Take 1 tablet (2 mg total) by mouth daily. 30 tablet 0   No current facility-administered medications for this visit.    SURGICAL HISTORY:  Past Surgical History  Procedure Laterality Date  . Colonoscopy    . Cervical cone biopsy    . Cataract extraction w/phaco Left 01/20/2014    Procedure: CATARACT EXTRACTION PHACO AND INTRAOCULAR LENS PLACEMENT (IOC);  Surgeon: Adonis Brook, Darlene Howard;  Location: [place];  Service: Ophthalmology;  Laterality: Left;  . 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 Co2  ablation vulva perianal and vaginal dysplatic areas  02-15-2010  . Abdominal hysterectomy  1983    w/  appendectomy and left salpingoophorectom  . Hemicolectomy Right 04-27-2005    CARCINOMA   ASCENDING COLON  . Vulvectomy N/A 05/06/2014    Procedure: WIDE LOCAL EXCISION LEFT LABIA MAJORA;  Surgeon: Janie Morning, Darlene Howard;  Location: [facility];  Service: Gynecology;  Laterality: N/A;  . Vulvar lesion removal N/A 05/06/2014    Procedure: LASER OF VULVAR LESION;  Surgeon: Janie Morning, Darlene Howard;  Location[facility];  Service: Gynecology;  Laterality: N/A;  . Lesion removal N/A 05/06/2014    Procedure: LASER OF THE VAGINA;  Surgeon: Janie Morning, Darlene Howard;  Location: [facility];  Service: Gynecology;  Laterality: N/A;    REVIEW OF SYSTEMS:  Constitutional: negative Eyes: negative Ears, nose, mouth, throat, and face: negative Respiratory: negative Cardiovascular: negative Gastrointestinal: negative Genitourinary:negative Integument/breast: negative Hematologic/lymphatic: negative Musculoskeletal:positive for Left shoulder pain Neurological: negative Behavioral/Psych: negative Endocrine: negative Allergic/Immunologic: negative   PHYSICAL EXAMINATION: General appearance: alert, cooperative and no distress Head: Normocephalic, without obvious abnormality, atraumatic Neck: no adenopathy Lymph nodes: Cervical, supraclavicular, and axillary nodes normal. Resp: clear to auscultation bilaterally Back: symmetric, no curvature. ROM normal. No CVA tenderness. Cardio: regular rate and rhythm, S1, S2 normal, no murmur, click, rub or gallop GI:  soft, non-tender; bowel sounds normal; no masses,  no organomegaly Extremities: extremities normal, atraumatic, no cyanosis or edema Neurologic: Alert and oriented X 3, normal strength and tone. Normal symmetric reflexes. Normal coordination and gait  ECOG PERFORMANCE STATUS: 1 - Symptomatic but completely ambulatory  Blood pressure 140/73, pulse 84, temperature 98.4 F (36.9 C), temperature source Oral, resp. rate 18, height _0  (1.676 m), weight 212 lb (96.163 kg), SpO2 100 %.  LABORATORY DATA: Lab Results  Component Value Date   WBC 9.1 11/03/2014   HGB 10.5* 11/03/2014   HCT 32.9* 11/03/2014   MCV 96.8 11/03/2014   PLT 133* 11/03/2014      Chemistry      Component Value Date/Time   NA 140 10/27/2014 1038   NA 141 10/01/2014 1436   K 3.9 10/27/2014 1038   K 3.6 10/01/2014 1436   CL 103 10/27/2014 1038   CL 105 06/17/2013 1441   CO2 27 10/27/2014 1038   CO2 24 10/01/2014 1436   BUN 13 10/27/2014 1038   BUN 7.2 10/01/2014 1436   CREATININE 0.73  10/27/2014 1038   CREATININE 0.8 10/01/2014 1436   CREATININE 0.81 01/14/2014 1413      Component Value Date/Time   CALCIUM 9.6 10/27/2014 1038   CALCIUM 9.7 10/01/2014 1436   ALKPHOS 62 10/27/2014 1038   ALKPHOS 66 10/01/2014 1436   AST 33 10/27/2014 1038   AST 22 10/01/2014 1436   ALT 34 10/27/2014 1038   ALT 32 10/01/2014 1436   BILITOT 1.4* 10/27/2014 1038   BILITOT 0.99 10/01/2014 1436     RADIOGRAPHIC STUDIES: Dg Shoulder Left  10/22/2014   CLINICAL DATA:  63 year old female with a history left shoulder pain for several weeks. No known injury. Treated for multiple myeloma.  EXAM: LEFT SHOULDER - 2+ VIEW  COMPARISON:  None.  FINDINGS: No acute fracture identified.  Advanced degenerative changes of the acromioclavicular joint, as well as significant subacromial bony spurring. No significant soft tissue swelling. Glenohumeral joint appears congruent.  IMPRESSION: No acute abnormality.  Advanced degenerative changes of the acromioclavicular joint, as well as subacromial bony spurring.  Signed,  Dulcy Fanny. Earleen Newport, DO  [facility]  [facility]   Electronically Signed   By: Corrie Mckusick D.O.   On: 10/22/2014 13:32    ASSESSMENT AND PLAN: This is a very pleasant 63 years old [place] American female with history of multiple myeloma currently on treatment with Revlimid and low-dose Decadron and tolerating her treatment fairly well. The patient is doing fine today with no specific complaints.  I recommended for the patient to continue her current treatment with Revlimid and Decadron. She would come back for followup visit in one month with repeat CBC, and comprehensive metabolic panel. She was advised to call immediately if she has any concerning symptoms and interval. The patient voices understanding of current disease status and treatment options and is in agreement with the current care plan.  All questions were answered. The patient knows to  call the clinic with any problems, questions or concerns. We can certainly see the patient much sooner if necessary.  Disclaimer: This note was dictated with voice recognition software. Similar sounding words can inadvertently be transcribed and may not be corrected upon review.

## 2014-11-10 ENCOUNTER — Telehealth: Payer: Self-pay | Admitting: Gynecologic Oncology

## 2014-11-10 DIAGNOSIS — M19012 Primary osteoarthritis, left shoulder: Secondary | ICD-10-CM | POA: Diagnosis not present

## 2014-11-10 NOTE — Telephone Encounter (Signed)
GYN ONCOLOGY OFFICE VISIT    Frances P Harig 63 y.o. female  CC: VAIN III VIN III  Post op check.  Assessment/Plan:Ms. Javia LALANYA RUFENER  is a 63 y.o. with previous history of multifocal genital dysplasia who now presents for abnormal Pap test. Evaluation 02/2014 that included colposcopy of the vagina and vulva is noteworthy for the presence of vaginal and vulvar dysplasia.  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  On 05/06/2014 she underwent CO2 laser/excision of vulvar lesions and CO2 laser ablation of the vaginal lesion.  Follow-up with Gyn Onc in 6 months   HPI: Ms. MELINDA GWINNER  is a 63 y.o.  with IgA multiple myeloma under the care of Dr.Mohammed Mohammed. 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   In 02/15/2010 she underwent extensive CO2 laser ablation of the vulva perianal and vaginal dysplastic areas. A Pap test subsequently in June of 2000 the level returned with evidence of low-grade squamous intraepithelial lesion. Colposcopy was then performed with biopsy for September of 2011 was consistent with vain 1. Ms. Olberding has not been seen since.  A Pap test was collected on 02/22/2014 was notable for atypical squamous cells of undetermined significance with high risk HPV detected. Patient seen 03/18/2014 vaginal and vulvar colposcopy with biopsies were  performed.. 1. Labium, biopsy,  VIN-III, EXTENDING TO THE EDGE OF THE BIOPSY. 2. Vulva, biopsy, posterior fourchette - VIN-III,  3. Vagina, biopsy, right lateral apex - VAIN-III.  On 05/06/2014 she underwent extensive multifocal CO2 laser of the vulva and vagina and WLE of a left vulvar lesion. Path Vulva, excision, inferior left labia majora (VIN-III/CIS), MARGINS NOT INVOLVED.   Past Surgical Hx:  Past Surgical History  Procedure Laterality Date  . Colonoscopy    . Cervical cone biopsy    . Cataract extraction w/phaco Left 01/20/2014    Procedure: CATARACT EXTRACTION PHACO AND INTRAOCULAR  LENS PLACEMENT (IOC);  Surgeon: Adonis Brook, MD;  Location: [place];  Service: Ophthalmology;  Laterality: Left;  . 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 Co2  ablation vulva perianal and vaginal dysplatic areas  02-15-2010  . Abdominal hysterectomy  1983    w/  appendectomy and left salpingoophorectom  . Hemicolectomy Right 04-27-2005    CARCINOMA   ASCENDING COLON  . Vulvectomy N/A 05/06/2014    Procedure: WIDE LOCAL EXCISION LEFT LABIA MAJORA;  Surgeon: Janie Morning, MD;  Location: [facility];  Service: Gynecology;  Laterality: N/A;  . Vulvar lesion removal N/A 05/06/2014    Procedure: LASER OF VULVAR LESION;  Surgeon: Janie Morning, MD;  [facility];  Service: Gynecology;  Laterality: N/A;  . Lesion removal N/A 05/06/2014    Procedure: LASER OF THE VAGINA;  Surgeon: Janie Morning, MD;  Location: [facility];  Service: Gynecology;  Laterality: N/A;    Past Medical Hx:  Past Medical History  Diagnosis Date  . Glaucoma     uses eye drops as instructed  . Personal history of colon cancer     ASCENDING COLON--  S/P  RIGHT HEMICOLECTOMY WITH NEGATIVE NODES/    NO RECURRENCE  . Herpes     takes Valtrex as needed  . GERD (gastroesophageal reflux disease)     takes Pantoprazole daily  . Hypertension     takes Amlodipine and Diovan daily  . Bruises easily     d/t being on COumadin  . History  of colon polyps   . Type 2 diabetes mellitus   . Neuropathy associated with multiple myeloma     POST CHEMOTHERAPY AND STEM CELL TRANSPLANT  . H/O stem cell transplant     04-26-2006  AT [facility]  . Multiple myeloma DX  2006  STATE IIA,  IgA  KAPPA    STABLE  PER DR MOHAMED--  S/P STEM CELL TRANSPLANT 2007 /  RECURRENCE 2008  CHEMO ENDED 03-30-2008/   NOW ON MAINTANANCE REVLIMID )  . Vocal cord nodule     ASSESSED BY DR TEOH--  LMA  OK W/ PROCEDURE'S  . DDD (degenerative disc disease), lumbar     Past Gynecological History:   No  LMP recorded. Patient has had a hysterectomy.  Family Hx:  Family History  Problem Relation Age of Onset  . Kidney failure Mother   . Hypertension Mother   . Prostate cancer Father   . Hypertension Father     vascular disease   . Stroke Father   . Hypertension Sister   . Hypertension Sister   . Hypertension Brother   . Hyperlipidemia Brother     Review of Systems:  Constitutional  Feels well,   Gastro Intestinal  No nausea, vomitting, or diarrhoea. Genito Urinary  No frequency, urgency, dysuria, no pruritis or bleeding. Musculo Skeletal  No myalgia, arthralgia, joint swelling or pain   Physical Exam: WD in NAD Normoactive bowel sounds, abdomen soft, non-tender and obese.  Back No CVA tenderness Genito Urinary   Vulva/vagina: External genitalia was notable for changes consistent with healing of the vagina, vulvar and perianal area.   Extremities  No bilateral cyanosis, clubbing or edema.

## 2014-11-11 ENCOUNTER — Other Ambulatory Visit (HOSPITAL_COMMUNITY)
Admission: RE | Admit: 2014-11-11 | Discharge: 2014-11-11 | Disposition: A | Discharge status: Home / Self Care | Payer: Medicare Other | Source: Ambulatory Visit | Attending: Gynecologic Oncology | Admitting: Gynecologic Oncology

## 2014-11-11 ENCOUNTER — Ambulatory Visit: Payer: Medicare Other | Attending: Gynecologic Oncology | Admitting: Gynecologic Oncology

## 2014-11-11 ENCOUNTER — Encounter: Payer: Self-pay | Admitting: Gynecologic Oncology

## 2014-11-11 VITALS — BP 128/63 | HR 87 | Temp 98.0°F | Resp 20 | Ht 66.0 in | Wt 213.5 lb

## 2014-11-11 DIAGNOSIS — Z113 Encounter for screening for infections with a predominantly sexual mode of transmission: Secondary | ICD-10-CM | POA: Diagnosis not present

## 2014-11-11 DIAGNOSIS — Z4889 Encounter for other specified surgical aftercare: Secondary | ICD-10-CM | POA: Insufficient documentation

## 2014-11-11 DIAGNOSIS — Z90710 Acquired absence of both cervix and uterus: Secondary | ICD-10-CM | POA: Insufficient documentation

## 2014-11-11 DIAGNOSIS — R87619 Unspecified abnormal cytological findings in specimens from cervix uteri: Secondary | ICD-10-CM | POA: Insufficient documentation

## 2014-11-11 DIAGNOSIS — D071 Carcinoma in situ of vulva: Secondary | ICD-10-CM | POA: Diagnosis not present

## 2014-11-11 DIAGNOSIS — Z8042 Family history of malignant neoplasm of prostate: Secondary | ICD-10-CM | POA: Insufficient documentation

## 2014-11-11 DIAGNOSIS — Z9079 Acquired absence of other genital organ(s): Secondary | ICD-10-CM | POA: Diagnosis not present

## 2014-11-11 DIAGNOSIS — I10 Essential (primary) hypertension: Secondary | ICD-10-CM | POA: Insufficient documentation

## 2014-11-11 DIAGNOSIS — B009 Herpesviral infection, unspecified: Secondary | ICD-10-CM | POA: Insufficient documentation

## 2014-11-11 DIAGNOSIS — Z8249 Family history of ischemic heart disease and other diseases of the circulatory system: Secondary | ICD-10-CM | POA: Diagnosis not present

## 2014-11-11 DIAGNOSIS — Z85038 Personal history of other malignant neoplasm of large intestine: Secondary | ICD-10-CM | POA: Insufficient documentation

## 2014-11-11 DIAGNOSIS — E119 Type 2 diabetes mellitus without complications: Secondary | ICD-10-CM | POA: Insufficient documentation

## 2014-11-11 DIAGNOSIS — N952 Postmenopausal atrophic vaginitis: Secondary | ICD-10-CM

## 2014-11-11 DIAGNOSIS — C9000 Multiple myeloma not having achieved remission: Secondary | ICD-10-CM | POA: Diagnosis not present

## 2014-11-11 DIAGNOSIS — Z124 Encounter for screening for malignant neoplasm of cervix: Secondary | ICD-10-CM | POA: Diagnosis not present

## 2014-11-11 DIAGNOSIS — R87810 Cervical high risk human papillomavirus (HPV) DNA test positive: Secondary | ICD-10-CM | POA: Diagnosis not present

## 2014-11-11 MED ORDER — ESTROGENS, CONJUGATED 0.625 MG/GM VA CREA: 1.0000 | TOPICAL_CREAM | VAGINAL | Status: DC

## 2014-11-11 NOTE — Patient Instructions (Signed)
Follow-up in 1 year Please use vaginal estrogen for two months prior to the next visit.    Thank you very much [name] for allowing me to provide care for you today.  I appreciate your confidence in choosing our Gynecologic Oncology team.  If you have any questions about your visit today please call our office and we will get back to you as soon as possible.  Please consider using the website Medlineplus.gov as an Geneticist, molecular.   Francetta Found. Ivann Trimarco MD., PhD Gynecologic Oncology

## 2014-11-11 NOTE — Addendum Note (Signed)
Addended by: Lucile Crater on: 11/11/2014 03:44 PM   Modules accepted: Orders

## 2014-11-11 NOTE — Progress Notes (Signed)
GYN ONCOLOGY OFFICE VISIT    Darlene Howard 63 y.o. female  CC: VAIN III VIN III  Post op check.  Assessment/Plan:[name]  is a 63 y.o. with previous history of multifocal genital dysplasia who now presents for abnormal Pap test. Evaluation 02/2014 that included colposcopy of the vagina and vulva is noteworthy for the presence of vaginal and vulvar dysplasia.  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  On 05/06/2014 she underwent CO2 laser/excision of vulvar lesions and CO2 laser ablation of the vaginal lesion. Pap collected today  Follow-up with Gyn Onc in 12 months  Vaginal atrophy Vaginal premarin cream Rx.    HPI: [name]  is a 63 y.o.  with IgA multiple myeloma under the care of Dr.Mohammed Mohammed. 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   In 02/15/2010 she underwent extensive CO2 laser ablation of the vulva perianal and vaginal dysplastic areas. A Pap test subsequently in June of 2000 the level returned with evidence of low-grade squamous intraepithelial lesion. Colposcopy was then performed with biopsy for September of 2011 was consistent with vain 1. [name] has not been seen since.  A Pap test was collected on 02/22/2014 was notable for atypical squamous cells of undetermined significance with high risk HPV detected. Patient seen 03/18/2014 vaginal and vulvar colposcopy with biopsies were  performed.. 1. Labium, biopsy,  VIN-III, EXTENDING TO THE EDGE OF THE BIOPSY. 2. Vulva, biopsy, posterior fourchette - VIN-III,  3. Vagina, biopsy, right lateral apex - VAIN-III.  On 05/06/2014 she underwent extensive multifocal CO2 laser of the vulva and vagina and WLE of a left vulvar lesion. Path Vulva, excision, inferior left labia majora (VIN-III/CIS), MARGINS NOT INVOLVED.   Past Surgical Hx:  Past Surgical History  Procedure Laterality Date  . Colonoscopy    . Cervical cone biopsy    . Cataract extraction w/phaco Left  01/20/2014    Procedure: CATARACT EXTRACTION PHACO AND INTRAOCULAR LENS PLACEMENT (IOC);  Surgeon: Adonis Brook, MD;  Location: [place];  Service: Ophthalmology;  Laterality: Left;  . 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 Co2  ablation vulva perianal and vaginal dysplatic areas  02-15-2010  . Abdominal hysterectomy  1983    w/  appendectomy and left salpingoophorectom  . Hemicolectomy Right 04-27-2005    CARCINOMA   ASCENDING COLON  . Vulvectomy N/A 05/06/2014    Procedure: WIDE LOCAL EXCISION LEFT LABIA MAJORA;  Surgeon: Janie Morning, MD;  Location: [facility];  Service: Gynecology;  Laterality: N/A;  . Vulvar lesion removal N/A 05/06/2014    Procedure: LASER OF VULVAR LESION;  Surgeon: Janie Morning, MD;  Location: [facility];  Service: Gynecology;  Laterality: N/A;  . Lesion removal N/A 05/06/2014    Procedure: LASER OF THE VAGINA;  Surgeon: Janie Morning, MD;  Location: [facility];  Service: Gynecology;  Laterality: N/A;    Past Medical Hx:  Past Medical History  Diagnosis Date  . Glaucoma     uses eye drops as instructed  . Personal history of colon cancer     ASCENDING COLON--  S/P  RIGHT HEMICOLECTOMY WITH NEGATIVE NODES/    NO RECURRENCE  . Herpes     takes Valtrex as needed  . GERD (gastroesophageal reflux disease)     takes Pantoprazole daily  . Hypertension     takes Amlodipine and Diovan daily  . Bruises easily  d/t being on COumadin  . History of colon polyps   . Type 2 diabetes mellitus   . Neuropathy associated with multiple myeloma     POST CHEMOTHERAPY AND STEM CELL TRANSPLANT  . H/O stem cell transplant     04-26-2006  AT [facility]  . Multiple myeloma DX  2006  STATE IIA,  IgA  KAPPA    STABLE  PER DR MOHAMED--  S/P STEM CELL TRANSPLANT 2007 /  RECURRENCE 2008  CHEMO ENDED 03-30-2008/   NOW ON MAINTANANCE REVLIMID )  . Vocal cord nodule     ASSESSED BY DR TEOH--  LMA  OK W/ PROCEDURE'S  . DDD  (degenerative disc disease), lumbar     Past Gynecological History:   No LMP recorded. Patient has had a hysterectomy.  Family Hx:  Family History  Problem Relation Age of Onset  . Kidney failure Mother   . Hypertension Mother   . Prostate cancer Father   . Hypertension Father     vascular disease   . Stroke Father   . Hypertension Sister   . Hypertension Sister   . Hypertension Brother   . Hyperlipidemia Brother     Review of Systems:  Constitutional  Feels well,   Gastro Intestinal  No nausea, vomitting, or diarrhoea. Genito Urinary  No frequency, urgency, dysuria, no pruritis or bleeding. Denies vaginal discharge.  Reports vaginal dryness and dyspareunia.  Does not use premarin vaginal cream. Musculo Skeletal  No myalgia, arthralgia, joint swelling or pain   Physical Exam: WD in NAD Normoactive bowel sounds, abdomen soft, non-tender and obese.  Back No CVA tenderness Genito Urinary   Vulva/vagina: External genitalia was unremarkable for any changes.  Vagina is markedly atrophic.  No lesions no masses.  Pap collected Extremities  No bilateral cyanosis, clubbing or edema.

## 2014-11-15 ENCOUNTER — Other Ambulatory Visit: Payer: Self-pay | Admitting: *Deleted

## 2014-11-15 NOTE — Telephone Encounter (Signed)
THIS REFILL REQUEST FOR REVLIMID WAS GIVEN TO DR.MOHAMED'S NURSE, [name] BELL,RN.

## 2014-11-16 ENCOUNTER — Other Ambulatory Visit: Payer: Self-pay | Admitting: Gynecologic Oncology

## 2014-11-16 DIAGNOSIS — M19012 Primary osteoarthritis, left shoulder: Secondary | ICD-10-CM | POA: Diagnosis not present

## 2014-11-16 DIAGNOSIS — N952 Postmenopausal atrophic vaginitis: Secondary | ICD-10-CM

## 2014-11-16 DIAGNOSIS — S46012D Strain of muscle(s) and tendon(s) of the rotator cuff of left shoulder, subsequent encounter: Secondary | ICD-10-CM | POA: Diagnosis not present

## 2014-11-16 LAB — CYTOLOGY - PAP

## 2014-11-16 MED ORDER — ESTROGENS, CONJUGATED 0.625 MG/GM VA CREA: 1.0000 | TOPICAL_CREAM | VAGINAL | Status: DC

## 2014-11-19 ENCOUNTER — Other Ambulatory Visit: Payer: Self-pay | Admitting: Medical Oncology

## 2014-11-19 DIAGNOSIS — S46012D Strain of muscle(s) and tendon(s) of the rotator cuff of left shoulder, subsequent encounter: Secondary | ICD-10-CM | POA: Diagnosis not present

## 2014-11-19 DIAGNOSIS — C9000 Multiple myeloma not having achieved remission: Secondary | ICD-10-CM

## 2014-11-19 MED ORDER — LENALIDOMIDE 15 MG PO CAPS: ORAL_CAPSULE | ORAL | Status: DC

## 2014-11-22 ENCOUNTER — Other Ambulatory Visit: Payer: Self-pay | Admitting: *Deleted

## 2014-11-22 DIAGNOSIS — C9000 Multiple myeloma not having achieved remission: Secondary | ICD-10-CM

## 2014-11-22 MED ORDER — LENALIDOMIDE 15 MG PO CAPS: ORAL_CAPSULE | ORAL | Status: DC

## 2014-11-22 NOTE — Telephone Encounter (Signed)
rx was sent w/o an MD signature, verified which [facility] ok to e-prescribe to, e-prescribed new rx

## 2014-11-23 ENCOUNTER — Other Ambulatory Visit: Payer: Self-pay | Admitting: *Deleted

## 2014-11-23 DIAGNOSIS — C9000 Multiple myeloma not having achieved remission: Secondary | ICD-10-CM

## 2014-11-23 MED ORDER — LENALIDOMIDE 15 MG PO CAPS: ORAL_CAPSULE | ORAL | Status: DC

## 2014-11-24 ENCOUNTER — Telehealth: Payer: Self-pay | Admitting: *Deleted

## 2014-11-24 NOTE — Telephone Encounter (Signed)
Pt called stating that she will be getting her revlimid devilery on Saturday 11/27/14.  She will be starting 2 days later then usual due to a drug delivery delay.  She wanted to make sure this was okay with Dr Vista Mink.  Per Dr Vista Mink, okay to start revlimid when it is delivered.  Pt verbalized understanding.

## 2014-11-29 ENCOUNTER — Ambulatory Visit: Payer: Medicare Other | Admitting: Gastroenterology

## 2014-11-29 ENCOUNTER — Telehealth: Payer: Self-pay | Admitting: *Deleted

## 2014-11-29 ENCOUNTER — Telehealth: Payer: Self-pay | Admitting: Gynecologic Oncology

## 2014-11-29 NOTE — Telephone Encounter (Signed)
Per Joylene John, NP pt's pap smear showed atypical cells so Dr. Skeet Latch would like to do a colposcopy. Called pt and let her know this. Gave her appt for 12/02/14 at 9:15. Pt agreeable to come to this appt.

## 2014-11-29 NOTE — Telephone Encounter (Signed)
GYN ONCOLOGY OFFICE VISIT    Darlene Howard 63 y.o. female  CC: VAIN III VIN III    Assessment/Plan:Darlene Howard  is a 63 y.o. with previous history of multifocal genital dysplasia who now presents for abnormal Pap test. Evaluation 02/2014 that included colposcopy of the vagina and vulva is noteworthy for the presence of vaginal and vulvar dysplasia.  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  On 05/06/2014 she underwent CO2 laser/excision of vulvar lesions and CO2 laser ablation of the vaginal lesion. Will call with the results of vaginal bx Follow-up with Gyn Onc in 12 months  Vaginal atrophy Vaginal premarin cream Rx.    HPI: Darlene Howard  is a 63 y.o.  with IgA multiple myeloma under the care of Dr.Mohammed Mohammed. 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   In 02/15/2010 she underwent extensive CO2 laser ablation of the vulva perianal and vaginal dysplastic areas. A Pap test subsequently in June of 2000 the level returned with evidence of low-grade squamous intraepithelial lesion. Colposcopy was then performed with biopsy for September of 2011 was consistent with vain 1. Ms. Tidd has not been seen since.  A Pap test was collected on 02/22/2014 was notable for atypical squamous cells of undetermined significance with high risk HPV detected. Patient seen 03/18/2014 vaginal and vulvar colposcopy with biopsies were  performed.. 1. Labium, biopsy,  VIN-III, EXTENDING TO THE EDGE OF THE BIOPSY. 2. Vulva, biopsy, posterior fourchette - VIN-III,  3. Vagina, biopsy, right lateral apex - VAIN-III.  On 05/06/2014 she underwent extensive multifocal CO2 laser of the vulva and vagina and WLE of a left vulvar lesion. Path Vulva, excision, inferior left labia majora (VIN-III/CIS), MARGINS NOT INVOLVED.   Past Surgical Hx:  Past Surgical History  Procedure Laterality Date  . Colonoscopy    . Cervical cone biopsy    . Cataract extraction w/phaco  Left 01/20/2014    Procedure: CATARACT EXTRACTION PHACO AND INTRAOCULAR LENS PLACEMENT (IOC);  Surgeon: Adonis Brook, MD;  Location: [place];  Service: Ophthalmology;  Laterality: Left;  . 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 Co2  ablation vulva perianal and vaginal dysplatic areas  02-15-2010  . Abdominal hysterectomy  1983    w/  appendectomy and left salpingoophorectom  . Hemicolectomy Right 04-27-2005    CARCINOMA   ASCENDING COLON  . Vulvectomy N/A 05/06/2014    Procedure: WIDE LOCAL EXCISION LEFT LABIA MAJORA;  Surgeon: Janie Morning, MD;  Location: [facility];  Service: Gynecology;  Laterality: N/A;  . Vulvar lesion removal N/A 05/06/2014    Procedure: LASER OF VULVAR LESION;  Surgeon: Janie Morning, MD;  Location: [facility];  Service: Gynecology;  Laterality: N/A;  . Lesion removal N/A 05/06/2014    Procedure: LASER OF THE VAGINA;  Surgeon: Janie Morning, MD;  Location: [facility];  Service: Gynecology;  Laterality: N/A;    Past Medical Hx:  Past Medical History  Diagnosis Date  . Glaucoma     uses eye drops as instructed  . Personal history of colon cancer     ASCENDING COLON--  S/P  RIGHT HEMICOLECTOMY WITH NEGATIVE NODES/    NO RECURRENCE  . Herpes     takes Valtrex as needed  . GERD (gastroesophageal reflux disease)     takes Pantoprazole daily  . Hypertension     takes Amlodipine and Diovan daily  .  Bruises easily     d/t being on COumadin  . History of colon polyps   . Type 2 diabetes mellitus   . Neuropathy associated with multiple myeloma     POST CHEMOTHERAPY AND STEM CELL TRANSPLANT  . H/O stem cell transplant     04-26-2006  AT [facility]  . Multiple myeloma DX  2006  STATE IIA,  IgA  KAPPA    STABLE  PER DR MOHAMED--  S/P STEM CELL TRANSPLANT 2007 /  RECURRENCE 2008  CHEMO ENDED 03-30-2008/   NOW ON MAINTANANCE REVLIMID )  . Vocal cord nodule     ASSESSED BY DR TEOH--  LMA  OK W/ PROCEDURE'S  . DDD  (degenerative disc disease), lumbar     Past Gynecological History:   No LMP recorded. Patient has had a hysterectomy.  Family Hx:  Family History  Problem Relation Age of Onset  . Kidney failure Mother   . Hypertension Mother   . Prostate cancer Father   . Hypertension Father     vascular disease   . Stroke Father   . Hypertension Sister   . Hypertension Sister   . Hypertension Brother   . Hyperlipidemia Brother     Review of Systems:  Constitutional  Feels well,   Gastro Intestinal  No nausea, vomitting, or diarrhoea. Genito Urinary  No frequency, urgency, dysuria, no pruritis or bleeding. Denies vaginal discharge.  Reports vaginal dryness and dyspareunia.  Does not use premarin vaginal cream. Musculo Skeletal  No myalgia, arthralgia, joint swelling or pain   Physical Exam: WD in NAD Normoactive bowel sounds, abdomen soft, non-tender and obese.  Back No CVA tenderness Genito Urinary   Vulva/vagina: External genitalia was unremarkable for any changes.  Vagina is markedly atrophic.  No lesions no masses.  Pap collected Extremities  No bilateral cyanosis, clubbing or edema.

## 2014-12-01 ENCOUNTER — Other Ambulatory Visit: Payer: Self-pay | Admitting: Internal Medicine

## 2014-12-01 ENCOUNTER — Other Ambulatory Visit (HOSPITAL_BASED_OUTPATIENT_CLINIC_OR_DEPARTMENT_OTHER): Payer: Medicare Other

## 2014-12-01 ENCOUNTER — Telehealth: Payer: Self-pay | Admitting: Internal Medicine

## 2014-12-01 ENCOUNTER — Encounter: Payer: Self-pay | Admitting: Internal Medicine

## 2014-12-01 ENCOUNTER — Ambulatory Visit (HOSPITAL_BASED_OUTPATIENT_CLINIC_OR_DEPARTMENT_OTHER): Payer: Medicare Other | Admitting: Internal Medicine

## 2014-12-01 VITALS — BP 159/75 | HR 97 | Temp 97.9°F | Resp 18 | Ht 66.0 in | Wt 214.4 lb

## 2014-12-01 DIAGNOSIS — C9000 Multiple myeloma not having achieved remission: Secondary | ICD-10-CM

## 2014-12-01 LAB — CBC WITH DIFFERENTIAL/PLATELET
BASO%: 0.6 % (ref 0.0–2.0)
Basophils Absolute: 0 10*3/uL (ref 0.0–0.1)
EOS%: 1.8 % (ref 0.0–7.0)
Eosinophils Absolute: 0.1 10*3/uL (ref 0.0–0.5)
HCT: 32.6 % — ABNORMAL LOW (ref 34.8–46.6)
HGB: 10.2 g/dL — ABNORMAL LOW (ref 11.6–15.9)
LYMPH%: 11.7 % — AB (ref 14.0–49.7)
MCH: 30.3 pg (ref 25.1–34.0)
MCHC: 31.3 g/dL — ABNORMAL LOW (ref 31.5–36.0)
MCV: 96.7 fL (ref 79.5–101.0)
MONO#: 0.5 10*3/uL (ref 0.1–0.9)
MONO%: 7.1 % (ref 0.0–14.0)
NEUT#: 5.6 10*3/uL (ref 1.5–6.5)
NEUT%: 78.8 % — ABNORMAL HIGH (ref 38.4–76.8)
NRBC: 3 % — AB (ref 0–0)
Platelets: 161 10*3/uL (ref 145–400)
RBC: 3.37 10*6/uL — AB (ref 3.70–5.45)
RDW: 16.1 % — ABNORMAL HIGH (ref 11.2–14.5)
WBC: 7.2 10*3/uL (ref 3.9–10.3)
lymph#: 0.8 10*3/uL — ABNORMAL LOW (ref 0.9–3.3)

## 2014-12-01 LAB — COMPREHENSIVE METABOLIC PANEL (CC13)
ALT: 22 U/L (ref 0–55)
AST: 20 U/L (ref 5–34)
Albumin: 3.1 g/dL — ABNORMAL LOW (ref 3.5–5.0)
Alkaline Phosphatase: 67 U/L (ref 40–150)
Anion Gap: 10 mEq/L (ref 3–11)
BILIRUBIN TOTAL: 0.99 mg/dL (ref 0.20–1.20)
BUN: 9.9 mg/dL (ref 7.0–26.0)
CO2: 26 mEq/L (ref 22–29)
CREATININE: 0.9 mg/dL (ref 0.6–1.1)
Calcium: 10 mg/dL (ref 8.4–10.4)
Chloride: 104 mEq/L (ref 98–109)
Glucose: 125 mg/dl (ref 70–140)
Potassium: 3.9 mEq/L (ref 3.5–5.1)
Sodium: 141 mEq/L (ref 136–145)
Total Protein: 7 g/dL (ref 6.4–8.3)

## 2014-12-01 LAB — LACTATE DEHYDROGENASE (CC13): LDH: 182 U/L (ref 125–245)

## 2014-12-01 LAB — TECHNOLOGIST REVIEW

## 2014-12-01 NOTE — Progress Notes (Signed)
[place] [phone]   [phone] OFFICE PROGRESS NOTE  Darlene Nakayama, MD [address] [place] [address]  DIAGNOSIS: Stage IIA IgA kappa multiple myeloma diagnosed in 2006   PRIOR THERAPY:  1) status post treatment with thalidomide and Decadron followed by autologous peripheral blood stem cell transplant with high-dose melphalan on 04/26/2006 at [facility].  2) the patient had evidence for disease recurrence in August of 2008 and she was treated with 6 cycles of Velcade completed on 03/30/2008 with response to the treatment but was discontinued secondary to neuropathy.  3) maintenance treatment with Revlimid 10 mg by mouth daily by the does was later increase to 25 mg by mouth daily secondary to biochemical recurrence with stabilization of her disease.   CURRENT THERAPY: Revlimid 15 mg by mouth daily for 2 weeks every 3 weeks in addition to Decadron currently at 20 mg by mouth on a weekly basis.   ADVANCED DIRECTIVE: She does not have advanced directive and was given information.  INTERVAL HISTORY: Darlene Howard 63 y.o. female returns to the clinic today for routine monthly followup visit. She is feeling fine today with no specific complaints. She is tolerating her treatment with Revlimid and Decadron fairly well with no significant adverse effects. The patient denied having any fever or chills. She denied having any chest pain, shortness of breath, cough or hemoptysis. She has no weight loss or night sweats. She has repeat CBC, comprehensive metabolic panel and LDH performed earlier today and she is here for evaluation and discussion of her lab results. She was found recently on the Pap smear to have some atypical squamous cell. She is currently under evaluation by her gynecologist.  MEDICAL HISTORY: Past Medical History  Diagnosis Date  . Glaucoma     uses eye drops as instructed  . Personal history of colon cancer       ASCENDING COLON--  S/P  RIGHT HEMICOLECTOMY WITH NEGATIVE NODES/    NO RECURRENCE  . Herpes     takes Valtrex as needed  . GERD (gastroesophageal reflux disease)     takes Pantoprazole daily  . Hypertension     takes Amlodipine and Diovan daily  . Bruises easily     d/t being on COumadin  . History of colon polyps   . Type 2 diabetes mellitus   . Neuropathy associated with multiple myeloma     POST CHEMOTHERAPY AND STEM CELL TRANSPLANT  . H/O stem cell transplant     04-26-2006  AT [facility]  . Multiple myeloma DX  2006  STATE IIA,  IgA  KAPPA    STABLE  PER DR Barbara Keng--  S/P STEM CELL TRANSPLANT 2007 /  RECURRENCE 2008  CHEMO ENDED 03-30-2008/   NOW ON MAINTANANCE REVLIMID )  . Vocal cord nodule     ASSESSED BY DR TEOH--  LMA  OK W/ PROCEDURE'S  . DDD (degenerative disc disease), lumbar     ALLERGIES:  is allergic to codeine.  MEDICATIONS:  Current Outpatient Prescriptions  Medication Sig Dispense Refill  . alendronate (FOSAMAX) 70 MG tablet TAKE 1 TAB EACH WEEK 30 MIN PRIOR TO BREAKFAST WITH LARGE GLASS OF WATER ON AN EMPTY STOMACH. REMAIN UPRIGHT. 12 tablet 1  . amLODipine (NORVASC) 2.5 MG tablet TAKE 1 TABLET DAILY---  takes in am 90 tablet 1  . aspirin (ASPIRIN LOW DOSE) 81 MG EC tablet Take 81 mg by mouth daily.      Marland Kitchen  bimatoprost (LUMIGAN) 0.03 % ophthalmic drops Place 1 drop into both eyes at bedtime.     . calcium-vitamin D (OSCAL 500/200 D-3) 500-200 MG-UNIT per tablet Take 1 tablet by mouth 3 (three) times daily.      Marland Kitchen conjugated estrogens (PREMARIN) vaginal cream Place 1 Applicatorful vaginally 2 (two) times a week. 42.5 g 12  . dexamethasone (DECADRON) 4 MG tablet 5 tablets by mouth on a weekly basis. 20 tablet 2  . dorzolamide-timolol (COSOPT) 22.3-6.8 MG/ML ophthalmic solution Place 1 drop into both eyes 2 (two) times daily.    Marland Kitchen gabapentin (NEURONTIN) 100 MG capsule Take 1 capsule (100 mg total) by mouth 3 (three) times daily. 270 capsule 1  . lenalidomide  (REVLIMID) 15 MG capsule Take one capsule daily for 14 days.  Then 7 days off. 14 capsule 0  . metFORMIN (GLUCOPHAGE) 1000 MG tablet Take 1 tablet (1,000 mg total) by mouth daily with breakfast. 90 tablet 1  . Multiple Vitamins-Minerals (CENTRUM SILVER PO) Take by mouth daily.      . ONE TOUCH ULTRA TEST test strip USE AS DIRECTED TO CHECK DAILY. 50 each 3  . pantoprazole (PROTONIX) 40 MG tablet TAKE ONE TABLET BY MOUTH EVERY DAY FOR ACID REFLUX. 90 tablet 1  . potassium chloride SA (K-DUR,KLOR-CON) 20 MEQ tablet TAKE 1 TABLET BY MOUTH TWICE DAILY. 180 tablet 1  . valACYclovir (VALTREX) 1000 MG tablet TAKE ONE TABLET BY MOUTH DAILY AS NEEDED. 90 tablet 1  . valsartan-hydrochlorothiazide (DIOVAN-HCT) 320-25 MG per tablet TAKE ONE TABLET BY MOUTH DAILY. 90 tablet 1  . warfarin (COUMADIN) 2 MG tablet Take 1 tablet (2 mg total) by mouth daily. 30 tablet 0   No current facility-administered medications for this visit.    SURGICAL HISTORY:  Past Surgical History  Procedure Laterality Date  . Colonoscopy    . Cervical cone biopsy    . Cataract extraction w/phaco Left 01/20/2014    Procedure: CATARACT EXTRACTION PHACO AND INTRAOCULAR LENS PLACEMENT (IOC);  Surgeon: Adonis Brook, MD;  [facility];  Service: Ophthalmology;  Laterality: Left;  . 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 Co2  ablation vulva perianal and vaginal dysplatic areas  02-15-2010  . Abdominal hysterectomy  1983    w/  appendectomy and left salpingoophorectom  . Hemicolectomy Right 04-27-2005    CARCINOMA   ASCENDING COLON  . Vulvectomy N/A 05/06/2014    Procedure: WIDE LOCAL EXCISION LEFT LABIA MAJORA;  Surgeon: Janie Morning, MD;  Location: [facility];  Service: Gynecology;  Laterality: N/A;  . Vulvar lesion removal N/A 05/06/2014    Procedure: LASER OF VULVAR LESION;  Surgeon: Janie Morning, MD;  Location: [facility];  Service: Gynecology;  Laterality: N/A;  . Lesion removal  N/A 05/06/2014    Procedure: LASER OF THE VAGINA;  Surgeon: Janie Morning, MD;  Location: [facility];  Service: Gynecology;  Laterality: N/A;    REVIEW OF SYSTEMS:  Constitutional: negative Eyes: negative Ears, nose, mouth, throat, and face: negative Respiratory: negative Cardiovascular: negative Gastrointestinal: negative Genitourinary:negative Integument/breast: negative Hematologic/lymphatic: negative Musculoskeletal:positive for Left shoulder pain Neurological: negative Behavioral/Psych: negative Endocrine: negative Allergic/Immunologic: negative   PHYSICAL EXAMINATION: General appearance: alert, cooperative and no distress Head: Normocephalic, without obvious abnormality, atraumatic Neck: no adenopathy Lymph nodes: Cervical, supraclavicular, and axillary nodes normal. Resp: clear to auscultation bilaterally Back: symmetric, no curvature. ROM normal. No CVA tenderness. Cardio: regular rate and rhythm, S1, S2 normal, no murmur,  click, rub or gallop GI: soft, non-tender; bowel sounds normal; no masses,  no organomegaly Extremities: extremities normal, atraumatic, no cyanosis or edema Neurologic: Alert and oriented X 3, normal strength and tone. Normal symmetric reflexes. Normal coordination and gait  ECOG PERFORMANCE STATUS: 1 - Symptomatic but completely ambulatory  Blood pressure 159/75, pulse 97, temperature 97.9 F (36.6 C), temperature source Oral, resp. rate 18, height '5\' 6"'  (1.676 m), weight 214 lb 6.4 oz (97.251 kg).  LABORATORY DATA: Lab Results  Component Value Date   WBC 7.2 12/01/2014   HGB 10.2* 12/01/2014   HCT 32.6* 12/01/2014   MCV 96.7 12/01/2014   PLT 161 12/01/2014      Chemistry      Component Value Date/Time   NA 141 11/03/2014 1450   NA 140 10/27/2014 1038   K 4.2 11/03/2014 1450   K 3.9 10/27/2014 1038   CL 103 10/27/2014 1038   CL 105 06/17/2013 1441   CO2 27 11/03/2014 1450   CO2 27 10/27/2014 1038   BUN 11.8 11/03/2014  1450   BUN 13 10/27/2014 1038   CREATININE 0.9 11/03/2014 1450   CREATININE 0.73 10/27/2014 1038   CREATININE 0.81 01/14/2014 1413      Component Value Date/Time   CALCIUM 9.6 11/03/2014 1450   CALCIUM 9.6 10/27/2014 1038   ALKPHOS 68 11/03/2014 1450   ALKPHOS 62 10/27/2014 1038   AST 16 11/03/2014 1450   AST 33 10/27/2014 1038   ALT 27 11/03/2014 1450   ALT 34 10/27/2014 1038   BILITOT 0.94 11/03/2014 1450   BILITOT 1.4* 10/27/2014 1038     RADIOGRAPHIC STUDIES: No results found.  ASSESSMENT AND PLAN: This is a very pleasant 63 years old [place] American female with history of multiple myeloma currently on treatment with Revlimid and low-dose Decadron and tolerating her treatment fairly well. The patient is doing fine today with no specific complaints.  I recommended for the patient to continue her current treatment with Revlimid and Decadron. She would come back for followup visit in one month with repeat myeloma panel. She was advised to call immediately if she has any concerning symptoms and interval. The patient voices understanding of current disease status and treatment options and is in agreement with the current care plan.  All questions were answered. The patient knows to call the clinic with any problems, questions or concerns. We can certainly see the patient much sooner if necessary.  Disclaimer: This note was dictated with voice recognition software. Similar sounding words can inadvertently be transcribed and may not be corrected upon review.

## 2014-12-01 NOTE — Telephone Encounter (Signed)
Pt confirmed labs/ov per 12/02 POF, gave pt AVS..... KJ

## 2014-12-02 ENCOUNTER — Encounter: Payer: Self-pay | Admitting: Gynecologic Oncology

## 2014-12-02 ENCOUNTER — Ambulatory Visit: Payer: Medicare Other | Attending: Gynecologic Oncology | Admitting: Gynecologic Oncology

## 2014-12-02 VITALS — BP 129/81 | HR 106 | Temp 97.5°F | Resp 18 | Ht 66.0 in | Wt 216.3 lb

## 2014-12-02 DIAGNOSIS — E118 Type 2 diabetes mellitus with unspecified complications: Secondary | ICD-10-CM | POA: Insufficient documentation

## 2014-12-02 DIAGNOSIS — M5136 Other intervertebral disc degeneration, lumbar region: Secondary | ICD-10-CM | POA: Insufficient documentation

## 2014-12-02 DIAGNOSIS — N952 Postmenopausal atrophic vaginitis: Secondary | ICD-10-CM | POA: Insufficient documentation

## 2014-12-02 DIAGNOSIS — Z85038 Personal history of other malignant neoplasm of large intestine: Secondary | ICD-10-CM | POA: Diagnosis not present

## 2014-12-02 DIAGNOSIS — I10 Essential (primary) hypertension: Secondary | ICD-10-CM | POA: Diagnosis not present

## 2014-12-02 DIAGNOSIS — J382 Nodules of vocal cords: Secondary | ICD-10-CM | POA: Diagnosis not present

## 2014-12-02 DIAGNOSIS — K219 Gastro-esophageal reflux disease without esophagitis: Secondary | ICD-10-CM | POA: Diagnosis not present

## 2014-12-02 DIAGNOSIS — R87811 Vaginal high risk human papillomavirus (HPV) DNA test positive: Secondary | ICD-10-CM | POA: Diagnosis not present

## 2014-12-02 DIAGNOSIS — Z90710 Acquired absence of both cervix and uterus: Secondary | ICD-10-CM | POA: Diagnosis not present

## 2014-12-02 DIAGNOSIS — D071 Carcinoma in situ of vulva: Secondary | ICD-10-CM | POA: Insufficient documentation

## 2014-12-02 DIAGNOSIS — D072 Carcinoma in situ of vagina: Secondary | ICD-10-CM | POA: Diagnosis not present

## 2014-12-02 DIAGNOSIS — B009 Herpesviral infection, unspecified: Secondary | ICD-10-CM | POA: Diagnosis not present

## 2014-12-02 DIAGNOSIS — Z6834 Body mass index (BMI) 34.0-34.9, adult: Secondary | ICD-10-CM | POA: Diagnosis not present

## 2014-12-02 DIAGNOSIS — Z86010 Personal history of colonic polyps: Secondary | ICD-10-CM | POA: Insufficient documentation

## 2014-12-02 DIAGNOSIS — C9000 Multiple myeloma not having achieved remission: Secondary | ICD-10-CM | POA: Diagnosis not present

## 2014-12-02 DIAGNOSIS — G629 Polyneuropathy, unspecified: Secondary | ICD-10-CM | POA: Insufficient documentation

## 2014-12-02 DIAGNOSIS — R87629 Unspecified abnormal cytological findings in specimens from vagina: Secondary | ICD-10-CM

## 2014-12-02 DIAGNOSIS — R87620 Atypical squamous cells of undetermined significance on cytologic smear of vagina (ASC-US): Secondary | ICD-10-CM

## 2014-12-02 MED ORDER — ESTROGENS, CONJUGATED 0.625 MG/GM VA CREA: 1.0000 | TOPICAL_CREAM | VAGINAL | Status: DC

## 2014-12-02 NOTE — Addendum Note (Signed)
Addended by: Joylene John D on: 12/02/2014 11:04 AM   Modules accepted: Orders

## 2014-12-02 NOTE — Addendum Note (Signed)
Addended by: Christa See on: 12/02/2014 10:51 AM   Modules accepted: Orders

## 2014-12-02 NOTE — Patient Instructions (Signed)
We will call you with your biopsy results. Happy holidays!!

## 2014-12-02 NOTE — Progress Notes (Signed)
GYN ONCOLOGY OFFICE VISIT    Darlene Howard 63 y.o. female  CC: VAIN III VIN III    Assessment/Plan:Darlene Howard  is a 63 y.o. with previous history of multifocal genital dysplasia who now presents for abnormal Pap test. Evaluation 02/2014 that included colposcopy of the vagina and vulva is noteworthy for the presence of vaginal and vulvar dysplasia.  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  On 05/06/2014 she underwent CO2 laser/excision of vulvar lesions and CO2 laser ablation of the vaginal lesion.  Pap 10/2014 ASCUS hrHPV + Vaginal and vulvar colposcopies performed todat Will call with the results of vaginal and vulvar biopsies.  If VIN VAIN detected will treat with co2 laser in 12/2014 Follow-up with Gyn Onc in 12 months  Vaginal atrophy Vaginal premarin cream Rx.    HPI: Darlene Howard  is a 63 y.o.  with IgA multiple myeloma under the care of Dr.Mohammed Mohammed. 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   In 02/15/2010 she underwent extensive CO2 laser ablation of the vulva perianal and vaginal dysplastic areas. A Pap test subsequently in June of 2000 the level returned with evidence of low-grade squamous intraepithelial lesion. Colposcopy was then performed with biopsy for September of 2011 was consistent with vain 1. Darlene Howard has not been seen since.  A Pap test was collected on 02/22/2014 was notable for atypical squamous cells of undetermined significance with high risk HPV detected. Patient seen 03/18/2014 vaginal and vulvar colposcopy with biopsies were  performed.. 1. Labium, biopsy,  VIN-III, EXTENDING TO THE EDGE OF THE BIOPSY. 2. Vulva, biopsy, posterior fourchette - VIN-III,  3. Vagina, biopsy, right lateral apex - VAIN-III.  On 05/06/2014 she underwent extensive multifocal CO2 laser of the vulva and vagina and WLE of a left vulvar lesion. Path Vulva, excision, inferior left labia majora (VIN-III/CIS), MARGINS NOT  INVOLVED.  Pap collected 11/11/2014 ASCUS hrHPVDNA+.  Patient presents for vaginal colposcopy.    Past Surgical Hx:  Past Surgical History  Procedure Laterality Date  . Colonoscopy    . Cervical cone biopsy    . Cataract extraction w/phaco Left 01/20/2014    Procedure: CATARACT EXTRACTION PHACO AND INTRAOCULAR LENS PLACEMENT (IOC);  Surgeon: Adonis Brook, MD;  Location: [facility];  Service: Ophthalmology;  Laterality: Left;  . 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 Co2  ablation vulva perianal and vaginal dysplatic areas  02-15-2010  . Abdominal hysterectomy  1983    w/  appendectomy and left salpingoophorectom  . Hemicolectomy Right 04-27-2005    CARCINOMA   ASCENDING COLON  . Vulvectomy N/A 05/06/2014    Procedure: WIDE LOCAL EXCISION LEFT LABIA MAJORA;  Surgeon: Janie Morning, MD;  Location: [facility];  Service: Gynecology;  Laterality: N/A;  . Vulvar lesion removal N/A 05/06/2014    Procedure: LASER OF VULVAR LESION;  Surgeon: Janie Morning, MD;  Location: [facility];  Service: Gynecology;  Laterality: N/A;  . Lesion removal N/A 05/06/2014    Procedure: LASER OF THE VAGINA;  Surgeon: Janie Morning, MD;  Location: [facility];  Service: Gynecology;  Laterality: N/A;    Past Medical Hx:  Past Medical History  Diagnosis Date  . Glaucoma     uses eye drops as instructed  . Personal history of colon cancer     ASCENDING COLON--  S/P  RIGHT HEMICOLECTOMY WITH NEGATIVE NODES/    NO  RECURRENCE  . Herpes     takes Valtrex as needed  . GERD (gastroesophageal reflux disease)     takes Pantoprazole daily  . Hypertension     takes Amlodipine and Diovan daily  . Bruises easily     d/t being on COumadin  . History of colon polyps   . Type 2 diabetes mellitus   . Neuropathy associated with multiple myeloma     POST CHEMOTHERAPY AND STEM CELL TRANSPLANT  . H/O stem cell transplant     04-26-2006  AT [facility]  . Multiple myeloma DX   2006  STATE IIA,  IgA  KAPPA    STABLE  PER DR MOHAMED--  S/P STEM CELL TRANSPLANT 2007 /  RECURRENCE 2008  CHEMO ENDED 03-30-2008/   NOW ON MAINTANANCE REVLIMID )  . Vocal cord nodule     ASSESSED BY DR TEOH--  LMA  OK W/ PROCEDURE'S  . DDD (degenerative disc disease), lumbar     Past Gynecological History:   No LMP recorded. Patient has had a hysterectomy.  Family Hx:  Family History  Problem Relation Age of Onset  . Kidney failure Mother   . Hypertension Mother   . Prostate cancer Father   . Hypertension Father     vascular disease   . Stroke Father   . Hypertension Sister   . Hypertension Sister   . Hypertension Brother   . Hyperlipidemia Brother     Review of Systems:  Constitutional  Feels well,   Genito Urinary  No frequency, urgency, dysuria, no pruritis or bleeding. Denies vaginal discharge.  Reports vaginal dryness and dyspareunia.  Does not use premarin vaginal cream.   Physical Exam:BP 129/81 mmHg  Pulse 106  Temp(Src) 97.5 F (36.4 C) (Oral)  Resp 18  Ht '5\' 6"'  (1.676 m)  Wt 216 lb 4.8 oz (98.113 kg)  BMI 34.93 kg/m2  WD in NAD Normoactive bowel sounds, abdomen soft, non-tender and obese.   Genito Urinary   Vulva/vagina: Colposcopy with 5% acetic acid of the external genitalia notable for WE of bilateral labia minora with the right sided acetowhite changes advancing into the vagina.    Colposcopy of the vagina notable for white changes along the superior right lateral vaginal wall.  Punch biopsies (75m)  of the  left inferior labia majora and mid right labia minora were collected after application of a local anesthetic.  The right lateral vaginal wall was biopsied.  Hemostasis at all sites was accomplished with pressure and sliver nitrate.

## 2014-12-06 ENCOUNTER — Encounter: Payer: Self-pay | Admitting: Gastroenterology

## 2014-12-06 ENCOUNTER — Ambulatory Visit (INDEPENDENT_AMBULATORY_CARE_PROVIDER_SITE_OTHER): Payer: Medicare Other | Admitting: Gastroenterology

## 2014-12-06 ENCOUNTER — Other Ambulatory Visit: Payer: Self-pay

## 2014-12-06 VITALS — BP 127/70 | HR 81 | Temp 97.6°F | Ht 66.0 in | Wt 216.6 lb

## 2014-12-06 DIAGNOSIS — K760 Fatty (change of) liver, not elsewhere classified: Secondary | ICD-10-CM | POA: Diagnosis not present

## 2014-12-06 DIAGNOSIS — Z85038 Personal history of other malignant neoplasm of large intestine: Secondary | ICD-10-CM

## 2014-12-06 MED ORDER — PEG-KCL-NACL-NASULF-NA ASC-C 100 G PO SOLR: 1.0000 | ORAL | Status: DC

## 2014-12-06 NOTE — Progress Notes (Signed)
cc'ed to pcp °

## 2014-12-06 NOTE — Patient Instructions (Signed)
Colonoscopy with Dr. Gala Romney. Hold you coumadin for four days before the procedure. See separate instructions.   Instructions for fatty liver: Recommend 1-2# weight loss per week until ideal body weight through exercise & diet. Low fat/cholesterol diet.   Avoid sweets, sodas, fruit juices, sweetened beverages like tea, etc. Gradually increase exercise from 15 min daily up to 1 hr per day 5 days/week. Limit alcohol use.  Fatty Liver Fatty liver is the accumulation of fat in liver cells. It is also called hepatosteatosis or steatohepatitis. It is normal for your liver to contain some fat. If fat is more than 5 to 10% of your liver's weight, you have fatty liver.  There are often no symptoms (problems) for years while damage is still occurring. People often learn about their fatty liver when they have medical tests for other reasons. Fat can damage your liver for years or even decades without causing problems. When it becomes severe, it can cause fatigue, weight loss, weakness, and confusion. This makes you more likely to develop more serious liver problems. The liver is the largest organ in the body. It does a lot of work and often gives no warning signs when it is sick until late in a disease. The liver has many important jobs including:  Breaking down foods.  Storing vitamins, iron, and other minerals.  Making proteins.  Making bile for food digestion.  Breaking down many products including medications, alcohol and some poisons. CAUSES  There are a number of different conditions, medications, and poisons that can cause a fatty liver. Eating too many calories causes fat to build up in the liver. Not processing and breaking fats down normally may also cause this. Certain conditions, such as obesity, diabetes, and high triglycerides also cause this. Most fatty liver patients tend to be middle-aged and over weight.  Some causes of fatty liver are:  Alcohol over  consumption.  Malnutrition.  Steroid use.  Valproic acid toxicity.  Obesity.  Cushing's syndrome.  Poisons.  Tetracycline in high dosages.  Pregnancy.  Diabetes.  Hyperlipidemia.  Rapid weight loss. Some people develop fatty liver even having none of these conditions. SYMPTOMS  Fatty liver most often causes no problems. This is called asymptomatic.  It can be diagnosed with blood tests and also by a liver biopsy.  It is one of the most common causes of minor elevations of liver enzymes on routine blood tests.  Specialized Imaging of the liver using ultrasound, CT (computed tomography) scan, or MRI (magnetic resonance imaging) can suggest a fatty liver but a biopsy is needed to confirm it.  A biopsy involves taking a small sample of liver tissue. This is done by using a needle. It is then looked at under a microscope by a specialist. TREATMENT  It is important to treat the cause. Simple fatty liver without a medical reason may not need treatment.  Weight loss, fat restriction, and exercise in overweight patients produces inconsistent results but is worth trying.  Fatty liver due to alcohol toxicity may not improve even with stopping drinking.  Good control of diabetes may reduce fatty liver.  Lower your triglycerides through diet, medication or both.  Eat a balanced, healthy diet.  Increase your physical activity.  Get regular checkups from a liver specialist.  There are no medical or surgical treatments for a fatty liver or NASH, but improving your diet and increasing your exercise may help prevent or reverse some of the damage. PROGNOSIS  Fatty liver may cause no damage  or it can lead to an inflammation of the liver. This is, called steatohepatitis. When it is linked to alcohol abuse, it is called alcoholic steatohepatitis. It often is not linked to alcohol. It is then called nonalcoholic steatohepatitis, or NASH. Over time the liver may become scarred and  hardened. This condition is called cirrhosis. Cirrhosis is serious and may lead to liver failure or cancer. NASH is one of the leading causes of cirrhosis. About 10-20% of Americans have fatty liver and a smaller 2-5% has NASH. Document Released: 02/01/2006 Document Revised: 03/10/2012 Document Reviewed: 04/28/2014 [facility] Patient Information 2015 [place], [place]. This information is not intended to replace advice given to you by your health care provider. Make sure you discuss any questions you have with your health care provider.

## 2014-12-06 NOTE — Assessment & Plan Note (Signed)
63 year old lady with history of colon cancer status post right hemicolectomy and chemotherapy back in 2006 who presents for surveillance colonoscopy. She is doing well. Denies any GI complaints. Chronic mild anemia in the setting of maintenance therapy for multiple myeloma. Colonoscopy in the near future. We will have her hold her Coumadin for 4 days prior to procedure. She reports being on Coumadin for preventative measures in setting of multiple myeloma. No prior history of clot, stroke, A. fib, etc. Further instructions regarding resuming Coumadin to be received on day of procedure at the discretion of Dr. Gala Romney.  I have discussed the risks, alternatives, benefits with regards to but not limited to the risk of reaction to medication, bleeding, infection, perforation and the patient is agreeable to proceed. Written consent to be obtained.

## 2014-12-06 NOTE — Progress Notes (Signed)
Primary Care Physician:  [name], MD  Primary Gastroenterologist:  Garfield Cornea, MD   Chief Complaint  Patient presents with  . Colonoscopy    no problems    HPI:  Darlene Howard is a 63 y.o. female here personal history of limited stage colon cancer s/p right hemicolectomy (2006) who presents for surveillance colonoscopy. Last colonoscopy was in 2010 at which time she had a hyperplastic polyp removed. Otherwise normal study. Denies any GI complaints. Bowel movements are regular. No blood in the stool or melena. Denies abdominal pain, heartburn, dysphagia, unintentional weight loss. Earlier this year she had an abdominal ultrasound done for abnormal LFTs. She had fatty liver and cholelithiasis. LFTs have since normalized. She has a history of multiple myeloma on maintenance therapy under the direction of Dr. Earlie Server. Patient reports being on Coumadin to prevent blood clots in the setting of multiple myeloma. Denies any prior history of blood clots, stroke, A. fib, etc. Typically holds Coumadin prior to procedures without any use of Lovenox per patient.    Current Outpatient Prescriptions  Medication Sig Dispense Refill  . alendronate (FOSAMAX) 70 MG tablet TAKE 1 TAB EACH WEEK 30 MIN PRIOR TO BREAKFAST WITH LARGE GLASS OF WATER ON AN EMPTY STOMACH. REMAIN UPRIGHT. 12 tablet 1  . amLODipine (NORVASC) 2.5 MG tablet TAKE 1 TABLET DAILY---  takes in am 90 tablet 1  . aspirin (ASPIRIN LOW DOSE) 81 MG EC tablet Take 81 mg by mouth daily.      . bimatoprost (LUMIGAN) 0.03 % ophthalmic drops Place 1 drop into both eyes at bedtime.     . calcium-vitamin D (OSCAL 500/200 D-3) 500-200 MG-UNIT per tablet Take 1 tablet by mouth 3 (three) times daily.      Marland Kitchen conjugated estrogens (PREMARIN) vaginal cream Place 1 Applicatorful vaginally 2 (two) times a week. 42.5 g 12  . dexamethasone (DECADRON) 4 MG tablet 5 tablets by mouth on a weekly basis. 20 tablet 2  . dorzolamide-timolol (COSOPT) 22.3-6.8  MG/ML ophthalmic solution Place 1 drop into both eyes 2 (two) times daily.    Marland Kitchen gabapentin (NEURONTIN) 100 MG capsule Take 1 capsule (100 mg total) by mouth 3 (three) times daily. 270 capsule 1  . lenalidomide (REVLIMID) 15 MG capsule Take one capsule daily for 14 days.  Then 7 days off. 14 capsule 0  . metFORMIN (GLUCOPHAGE) 1000 MG tablet Take 1 tablet (1,000 mg total) by mouth daily with breakfast. (Patient taking differently: Take 1,000 mg by mouth 2 (two) times daily with a meal. ) 90 tablet 1  . Multiple Vitamins-Minerals (CENTRUM SILVER PO) Take by mouth daily.      . ONE TOUCH ULTRA TEST test strip USE AS DIRECTED TO CHECK DAILY. 50 each 3  . pantoprazole (PROTONIX) 40 MG tablet TAKE ONE TABLET BY MOUTH EVERY DAY FOR ACID REFLUX. 90 tablet 1  . potassium chloride SA (K-DUR,KLOR-CON) 20 MEQ tablet TAKE 1 TABLET BY MOUTH TWICE DAILY. 180 tablet 1  . valACYclovir (VALTREX) 1000 MG tablet TAKE ONE TABLET BY MOUTH DAILY AS NEEDED. 90 tablet 1  . valsartan-hydrochlorothiazide (DIOVAN-HCT) 320-25 MG per tablet TAKE ONE TABLET BY MOUTH DAILY. 90 tablet 1  . warfarin (COUMADIN) 2 MG tablet Take 1 tablet by mouth  daily 30 tablet 2   No current facility-administered medications for this visit.    Allergies as of 12/06/2014 - Review Complete 12/06/2014  Allergen Reaction Noted  . Codeine Nausea And Vomiting 05/03/2008    Past Medical History  Diagnosis Date  . Glaucoma     uses eye drops as instructed  . Personal history of colon cancer     ASCENDING COLON--  S/P  RIGHT HEMICOLECTOMY WITH NEGATIVE NODES/    NO RECURRENCE  . Herpes     takes Valtrex as needed  . GERD (gastroesophageal reflux disease)     takes Pantoprazole daily  . Hypertension     takes Amlodipine and Diovan daily  . Bruises easily     d/t being on COumadin  . History of colon polyps   . Type 2 diabetes mellitus   . Neuropathy associated with multiple myeloma     POST CHEMOTHERAPY AND STEM CELL TRANSPLANT  . 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 Multiple myeloma DX  2006  STATE IIA,  IgA  KAPPA    STABLE  PER DR MOHAMED--  S/P STEM CELL TRANSPLANT 2007 /  RECURRENCE 2008  CHEMO ENDED 03-30-2008/   NOW ON MAINTANANCE REVLIMID )  . Vocal cord nodule     ASSESSED BY DR TEOH--  LMA  OK W/ PROCEDURE'S  . DDD (degenerative disc disease), lumbar   . Cholelithiasis   . Fatty liver     Past Surgical History  Procedure Laterality Date  . Colonoscopy  10/07/2006    [id number] rectum/Status post right hemicolectomy. Normal residual colon  . Cervical cone biopsy    . Cataract extraction w/phaco Left 01/20/2014    Procedure: CATARACT EXTRACTION PHACO AND INTRAOCULAR LENS PLACEMENT (IOC);  Surgeon: Adonis Brook, MD;  Location: [place];  Service: Ophthalmology;  Laterality: Left;  . 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 Co2  ablation vulva perianal and vaginal dysplatic areas  02-15-2010  . Abdominal hysterectomy  1983    w/  appendectomy and left salpingoophorectom  . Hemicolectomy Right 04-27-2005    CARCINOMA   ASCENDING COLON  . Vulvectomy N/A 05/06/2014    Procedure: WIDE LOCAL EXCISION LEFT LABIA MAJORA;  Surgeon: Janie Morning, MD;  Location: [facility];  Service: Gynecology;  Laterality: N/A;  . Vulvar lesion removal N/A 05/06/2014    Procedure: LASER OF VULVAR LESION;  Surgeon: Janie Morning, MD;  Location: [facility];  Service: Gynecology;  Laterality: N/A;  . Lesion removal N/A 05/06/2014    Procedure: LASER OF THE VAGINA;  Surgeon: Janie Morning, MD;  Location: [facility];  Service: Gynecology;  Laterality: N/A;  . Esophagogastroduodenoscopy  04/02/2005    CLE:[id number] polyp with central erosion versus ulceration in the body of the stomach, resected and recovered Remainder of the gastric mucosa, D1, D2 appeared normal  . Colonoscopy  10/2009    RMR: diminutive RS polyp (hyperplastic).     Family History   Problem Relation Age of Onset  . Kidney failure Mother   . Hypertension Mother   . Prostate cancer Father   . Hypertension Father     vascular disease   . Stroke Father   . Hypertension Sister   . Hypertension Sister   . Hypertension Brother   . Hyperlipidemia Brother   . Colon cancer Neg Hx   . Breast cancer Neg Hx     History   Social History  . Marital Status: Single    Spouse Name: N/A    Number of Children: N/A  . Years of Education: N/A   Occupational History  . Not on file.   Social History Main  Topics  . Smoking status: Never Smoker   . Smokeless tobacco: Never Used  . Alcohol Use: No  . Drug Use: No  . Sexual Activity: Not Currently    Birth Control/ Protection: Surgical   Other Topics Concern  . Not on file   Social History Narrative      ROS:  General: Negative for anorexia, weight loss, fever, chills, fatigue, weakness. Eyes: Negative for vision changes.  ENT: Negative for hoarseness, difficulty swallowing , nasal congestion. CV: Negative for chest pain, angina, palpitations, dyspnea on exertion, peripheral edema.  Respiratory: Negative for dyspnea at rest, dyspnea on exertion, cough, sputum, wheezing.  GI: See history of present illness. GU:  Negative for dysuria, hematuria, urinary incontinence, urinary frequency, nocturnal urination.  MS: +for joint pain. Negative for low back pain.  Derm: Negative for rash or itching.  Neuro: Negative for weakness, abnormal sensation, seizure, frequent headaches, memory loss, confusion.  Psych: Negative for anxiety, depression, suicidal ideation, hallucinations.  Endo: Negative for unusual weight change.  Heme: Negative for bruising or bleeding. Allergy: Negative for rash or hives.    Physical Examination:  BP 127/70 mmHg  Pulse 81  Temp(Src) 97.6 F (36.4 C) (Oral)  Ht 5' 6" (1.676 m)  Wt 216 lb 9.6 oz (98.249 kg)  BMI 34.98 kg/m2   General: Well-nourished, well-developed in no acute distress.   Head: Normocephalic, atraumatic.   Eyes: Conjunctiva pink, no icterus. Mouth: Oropharyngeal mucosa moist and pink , no lesions erythema or exudate. Neck: Supple without thyromegaly, masses, or lymphadenopathy.  Lungs: Clear to auscultation bilaterally.  Heart: Regular rate and rhythm, no murmurs rubs or gallops.  Abdomen: Bowel sounds are normal, nontender, nondistended, no hepatosplenomegaly or masses, no abdominal bruits or    hernia , no rebound or guarding.   Rectal: Not performed. Extremities: No lower extremity edema. No clubbing or deformities.  Neuro: Alert and oriented x 4 , grossly normal neurologically.  Skin: Warm and dry, no rash or jaundice.   Psych: Alert and cooperative, normal mood and affect.  Labs: Lab Results  Component Value Date   WBC 7.2 12/01/2014   HGB 10.2* 12/01/2014   HCT 32.6* 12/01/2014   MCV 96.7 12/01/2014   PLT 161 12/01/2014   Lab Results  Component Value Date   CREATININE 0.9 12/01/2014   BUN 9.9 12/01/2014   NA 141 12/01/2014   K 3.9 12/01/2014   CL 103 10/27/2014   CO2 26 12/01/2014   Lab Results  Component Value Date   ALT 22 12/01/2014   AST 20 12/01/2014   ALKPHOS 67 12/01/2014   BILITOT 0.99 12/01/2014     Imaging Studies: No results found.

## 2014-12-10 ENCOUNTER — Telehealth: Payer: Self-pay | Admitting: Nurse Practitioner

## 2014-12-10 NOTE — Telephone Encounter (Signed)
RN called to inform patient biopsies were clear with no abnormal or cancer cells per Joylene John, NP. Patient informed to call in the fall to schedule appt for 12/2015.  Patient verbalizes understanding and thanks for the call.

## 2014-12-13 DIAGNOSIS — L609 Nail disorder, unspecified: Secondary | ICD-10-CM | POA: Diagnosis not present

## 2014-12-13 DIAGNOSIS — E1165 Type 2 diabetes mellitus with hyperglycemia: Secondary | ICD-10-CM | POA: Diagnosis not present

## 2014-12-13 DIAGNOSIS — B351 Tinea unguium: Secondary | ICD-10-CM | POA: Diagnosis not present

## 2014-12-14 ENCOUNTER — Other Ambulatory Visit: Payer: Self-pay | Admitting: *Deleted

## 2014-12-14 DIAGNOSIS — C9000 Multiple myeloma not having achieved remission: Secondary | ICD-10-CM

## 2014-12-14 MED ORDER — LENALIDOMIDE 15 MG PO CAPS: ORAL_CAPSULE | ORAL | Status: DC

## 2014-12-15 ENCOUNTER — Ambulatory Visit (HOSPITAL_COMMUNITY)
Admission: RE | Admit: 2014-12-15 | Discharge: 2014-12-15 | Disposition: A | Discharge status: Home / Self Care | Payer: Medicare Other | Source: Ambulatory Visit | Attending: Internal Medicine | Admitting: Internal Medicine

## 2014-12-15 ENCOUNTER — Encounter (HOSPITAL_COMMUNITY): Payer: Self-pay | Admitting: *Deleted

## 2014-12-15 ENCOUNTER — Encounter (HOSPITAL_COMMUNITY)
Admission: RE | Disposition: A | Discharge status: Home / Self Care | Payer: Self-pay | Source: Ambulatory Visit | Attending: Internal Medicine

## 2014-12-15 DIAGNOSIS — Z85038 Personal history of other malignant neoplasm of large intestine: Secondary | ICD-10-CM | POA: Diagnosis not present

## 2014-12-15 DIAGNOSIS — I10 Essential (primary) hypertension: Secondary | ICD-10-CM | POA: Insufficient documentation

## 2014-12-15 DIAGNOSIS — K219 Gastro-esophageal reflux disease without esophagitis: Secondary | ICD-10-CM | POA: Diagnosis not present

## 2014-12-15 DIAGNOSIS — Z1211 Encounter for screening for malignant neoplasm of colon: Secondary | ICD-10-CM | POA: Diagnosis not present

## 2014-12-15 DIAGNOSIS — Z7982 Long term (current) use of aspirin: Secondary | ICD-10-CM | POA: Diagnosis not present

## 2014-12-15 DIAGNOSIS — E119 Type 2 diabetes mellitus without complications: Secondary | ICD-10-CM | POA: Insufficient documentation

## 2014-12-15 HISTORY — PX: COLONOSCOPY: SHX5424

## 2014-12-15 LAB — GLUCOSE, CAPILLARY: Glucose-Capillary: 138 mg/dL — ABNORMAL HIGH (ref 70–99)

## 2014-12-15 SURGERY — COLONOSCOPY
Anesthesia: Moderate Sedation

## 2014-12-15 MED ORDER — STERILE WATER FOR IRRIGATION IR SOLN
Status: DC | PRN
  Administered 2014-12-15: 13:00:00

## 2014-12-15 MED ORDER — ONDANSETRON HCL 4 MG/2ML IJ SOLN
INTRAMUSCULAR | Status: DC | PRN
  Administered 2014-12-15: 4 mg via INTRAVENOUS

## 2014-12-15 MED ORDER — MEPERIDINE HCL 100 MG/ML IJ SOLN
INTRAMUSCULAR | Status: DC | PRN
  Administered 2014-12-15: 50 mg via INTRAVENOUS
  Administered 2014-12-15: 25 mg via INTRAVENOUS

## 2014-12-15 MED ORDER — MIDAZOLAM HCL 5 MG/5ML IJ SOLN
INTRAMUSCULAR | Status: DC | PRN
  Administered 2014-12-15: 1 mg via INTRAVENOUS
  Administered 2014-12-15: 2 mg via INTRAVENOUS
  Administered 2014-12-15: 1 mg via INTRAVENOUS

## 2014-12-15 MED ORDER — MIDAZOLAM HCL 5 MG/5ML IJ SOLN
INTRAMUSCULAR | Status: AC
  Filled 2014-12-15: qty 10

## 2014-12-15 MED ORDER — ONDANSETRON HCL 4 MG/2ML IJ SOLN
INTRAMUSCULAR | Status: AC
  Filled 2014-12-15: qty 2

## 2014-12-15 MED ORDER — MEPERIDINE HCL 100 MG/ML IJ SOLN
INTRAMUSCULAR | Status: AC
  Filled 2014-12-15: qty 2

## 2014-12-15 MED ORDER — SODIUM CHLORIDE 0.9 % IV SOLN
INTRAVENOUS | Status: DC
  Administered 2014-12-15: 13:00:00 via INTRAVENOUS

## 2014-12-15 NOTE — H&P (View-Only) (Signed)
Primary Care Physician:  [name], MD  Primary Gastroenterologist:  Garfield Cornea, MD   Chief Complaint  Patient presents with  . Colonoscopy    no problems    HPI:  Darlene Howard is a 63 y.o. female here personal history of limited stage colon cancer s/p right hemicolectomy (2006) who presents for surveillance colonoscopy. Last colonoscopy was in 2010 at which time she had a hyperplastic polyp removed. Otherwise normal study. Denies any GI complaints. Bowel movements are regular. No blood in the stool or melena. Denies abdominal pain, heartburn, dysphagia, unintentional weight loss. Earlier this year she had an abdominal ultrasound done for abnormal LFTs. She had fatty liver and cholelithiasis. LFTs have since normalized. She has a history of multiple myeloma on maintenance therapy under the direction of Dr. Earlie Server. Patient reports being on Coumadin to prevent blood clots in the setting of multiple myeloma. Denies any prior history of blood clots, stroke, A. fib, etc. Typically holds Coumadin prior to procedures without any use of Lovenox per patient.    Current Outpatient Prescriptions  Medication Sig Dispense Refill  . alendronate (FOSAMAX) 70 MG tablet TAKE 1 TAB EACH WEEK 30 MIN PRIOR TO BREAKFAST WITH LARGE GLASS OF WATER ON AN EMPTY STOMACH. REMAIN UPRIGHT. 12 tablet 1  . amLODipine (NORVASC) 2.5 MG tablet TAKE 1 TABLET DAILY---  takes in am 90 tablet 1  . aspirin (ASPIRIN LOW DOSE) 81 MG EC tablet Take 81 mg by mouth daily.      . bimatoprost (LUMIGAN) 0.03 % ophthalmic drops Place 1 drop into both eyes at bedtime.     . calcium-vitamin D (OSCAL 500/200 D-3) 500-200 MG-UNIT per tablet Take 1 tablet by mouth 3 (three) times daily.      Marland Kitchen conjugated estrogens (PREMARIN) vaginal cream Place 1 Applicatorful vaginally 2 (two) times a week. 42.5 g 12  . dexamethasone (DECADRON) 4 MG tablet 5 tablets by mouth on a weekly basis. 20 tablet 2  . dorzolamide-timolol (COSOPT) 22.3-6.8  MG/ML ophthalmic solution Place 1 drop into both eyes 2 (two) times daily.    Marland Kitchen gabapentin (NEURONTIN) 100 MG capsule Take 1 capsule (100 mg total) by mouth 3 (three) times daily. 270 capsule 1  . lenalidomide (REVLIMID) 15 MG capsule Take one capsule daily for 14 days.  Then 7 days off. 14 capsule 0  . metFORMIN (GLUCOPHAGE) 1000 MG tablet Take 1 tablet (1,000 mg total) by mouth daily with breakfast. (Patient taking differently: Take 1,000 mg by mouth 2 (two) times daily with a meal. ) 90 tablet 1  . Multiple Vitamins-Minerals (CENTRUM SILVER PO) Take by mouth daily.      . ONE TOUCH ULTRA TEST test strip USE AS DIRECTED TO CHECK DAILY. 50 each 3  . pantoprazole (PROTONIX) 40 MG tablet TAKE ONE TABLET BY MOUTH EVERY DAY FOR ACID REFLUX. 90 tablet 1  . potassium chloride SA (K-DUR,KLOR-CON) 20 MEQ tablet TAKE 1 TABLET BY MOUTH TWICE DAILY. 180 tablet 1  . valACYclovir (VALTREX) 1000 MG tablet TAKE ONE TABLET BY MOUTH DAILY AS NEEDED. 90 tablet 1  . valsartan-hydrochlorothiazide (DIOVAN-HCT) 320-25 MG per tablet TAKE ONE TABLET BY MOUTH DAILY. 90 tablet 1  . warfarin (COUMADIN) 2 MG tablet Take 1 tablet by mouth  daily 30 tablet 2   No current facility-administered medications for this visit.    Allergies as of 12/06/2014 - Review Complete 12/06/2014  Allergen Reaction Noted  . Codeine Nausea And Vomiting 05/03/2008    Past Medical History  Diagnosis Date  . Glaucoma     uses eye drops as instructed  . Personal history of colon cancer     ASCENDING COLON--  S/P  RIGHT HEMICOLECTOMY WITH NEGATIVE NODES/    NO RECURRENCE  . Herpes     takes Valtrex as needed  . GERD (gastroesophageal reflux disease)     takes Pantoprazole daily  . Hypertension     takes Amlodipine and Diovan daily  . Bruises easily     d/t being on COumadin  . History of colon polyps   . Type 2 diabetes mellitus   . Neuropathy associated with multiple myeloma     POST CHEMOTHERAPY AND STEM CELL TRANSPLANT  . 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 Multiple myeloma DX  2006  STATE IIA,  IgA  KAPPA    STABLE  PER DR MOHAMED--  S/P STEM CELL TRANSPLANT 2007 /  RECURRENCE 2008  CHEMO ENDED 03-30-2008/   NOW ON MAINTANANCE REVLIMID )  . Vocal cord nodule     ASSESSED BY DR TEOH--  LMA  OK W/ PROCEDURE'S  . DDD (degenerative disc disease), lumbar   . Cholelithiasis   . Fatty liver     Past Surgical History  Procedure Laterality Date  . Colonoscopy  10/07/2006    [id number] rectum/Status post right hemicolectomy. Normal residual colon  . Cervical cone biopsy    . Cataract extraction w/phaco Left 01/20/2014    Procedure: CATARACT EXTRACTION PHACO AND INTRAOCULAR LENS PLACEMENT (IOC);  Surgeon: Adonis Brook, MD;  Location: [place];  Service: Ophthalmology;  Laterality: Left;  . 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 Co2  ablation vulva perianal and vaginal dysplatic areas  02-15-2010  . Abdominal hysterectomy  1983    w/  appendectomy and left salpingoophorectom  . Hemicolectomy Right 04-27-2005    CARCINOMA   ASCENDING COLON  . Vulvectomy N/A 05/06/2014    Procedure: WIDE LOCAL EXCISION LEFT LABIA MAJORA;  Surgeon: Janie Morning, MD;  Location: [facility];  Service: Gynecology;  Laterality: N/A;  . Vulvar lesion removal N/A 05/06/2014    Procedure: LASER OF VULVAR LESION;  Surgeon: Janie Morning, MD;  Location: [facility];  Service: Gynecology;  Laterality: N/A;  . Lesion removal N/A 05/06/2014    Procedure: LASER OF THE VAGINA;  Surgeon: Janie Morning, MD;  Location: [facility];  Service: Gynecology;  Laterality: N/A;  . Esophagogastroduodenoscopy  04/02/2005    CLE:[id number] polyp with central erosion versus ulceration in the body of the stomach, resected and recovered Remainder of the gastric mucosa, D1, D2 appeared normal  . Colonoscopy  10/2009    RMR: diminutive RS polyp (hyperplastic).     Family History   Problem Relation Age of Onset  . Kidney failure Mother   . Hypertension Mother   . Prostate cancer Father   . Hypertension Father     vascular disease   . Stroke Father   . Hypertension Sister   . Hypertension Sister   . Hypertension Brother   . Hyperlipidemia Brother   . Colon cancer Neg Hx   . Breast cancer Neg Hx     History   Social History  . Marital Status: Single    Spouse Name: N/A    Number of Children: N/A  . Years of Education: N/A   Occupational History  . Not on file.   Social History Main  Topics  . Smoking status: Never Smoker   . Smokeless tobacco: Never Used  . Alcohol Use: No  . Drug Use: No  . Sexual Activity: Not Currently    Birth Control/ Protection: Surgical   Other Topics Concern  . Not on file   Social History Narrative      ROS:  General: Negative for anorexia, weight loss, fever, chills, fatigue, weakness. Eyes: Negative for vision changes.  ENT: Negative for hoarseness, difficulty swallowing , nasal congestion. CV: Negative for chest pain, angina, palpitations, dyspnea on exertion, peripheral edema.  Respiratory: Negative for dyspnea at rest, dyspnea on exertion, cough, sputum, wheezing.  GI: See history of present illness. GU:  Negative for dysuria, hematuria, urinary incontinence, urinary frequency, nocturnal urination.  MS: +for joint pain. Negative for low back pain.  Derm: Negative for rash or itching.  Neuro: Negative for weakness, abnormal sensation, seizure, frequent headaches, memory loss, confusion.  Psych: Negative for anxiety, depression, suicidal ideation, hallucinations.  Endo: Negative for unusual weight change.  Heme: Negative for bruising or bleeding. Allergy: Negative for rash or hives.    Physical Examination:  BP 127/70 mmHg  Pulse 81  Temp(Src) 97.6 F (36.4 C) (Oral)  Ht 5' 6" (1.676 m)  Wt 216 lb 9.6 oz (98.249 kg)  BMI 34.98 kg/m2   General: Well-nourished, well-developed in no acute distress.   Head: Normocephalic, atraumatic.   Eyes: Conjunctiva pink, no icterus. Mouth: Oropharyngeal mucosa moist and pink , no lesions erythema or exudate. Neck: Supple without thyromegaly, masses, or lymphadenopathy.  Lungs: Clear to auscultation bilaterally.  Heart: Regular rate and rhythm, no murmurs rubs or gallops.  Abdomen: Bowel sounds are normal, nontender, nondistended, no hepatosplenomegaly or masses, no abdominal bruits or    hernia , no rebound or guarding.   Rectal: Not performed. Extremities: No lower extremity edema. No clubbing or deformities.  Neuro: Alert and oriented x 4 , grossly normal neurologically.  Skin: Warm and dry, no rash or jaundice.   Psych: Alert and cooperative, normal mood and affect.  Labs: Lab Results  Component Value Date   WBC 7.2 12/01/2014   HGB 10.2* 12/01/2014   HCT 32.6* 12/01/2014   MCV 96.7 12/01/2014   PLT 161 12/01/2014   Lab Results  Component Value Date   CREATININE 0.9 12/01/2014   BUN 9.9 12/01/2014   NA 141 12/01/2014   K 3.9 12/01/2014   CL 103 10/27/2014   CO2 26 12/01/2014   Lab Results  Component Value Date   ALT 22 12/01/2014   AST 20 12/01/2014   ALKPHOS 67 12/01/2014   BILITOT 0.99 12/01/2014     Imaging Studies: No results found.

## 2014-12-15 NOTE — Discharge Instructions (Signed)
°  Colonoscopy Discharge Instructions  Read the instructions outlined below and refer to this sheet in the next few weeks. These discharge instructions provide you with general information on caring for yourself after you leave the hospital. Your doctor may also give you specific instructions. While your treatment has been planned according to the most current medical practices available, unavoidable complications occasionally occur. If you have any problems or questions after discharge, call Dr. Gala Romney at [phone]. ACTIVITY  You may resume your regular activity, but move at a slower pace for the next 24 hours.   Take frequent rest periods for the next 24 hours.   Walking will help get rid of the air and reduce the bloated feeling in your belly (abdomen).   No driving for 24 hours (because of the medicine (anesthesia) used during the test).    Do not sign any important legal documents or operate any machinery for 24 hours (because of the anesthesia used during the test).  NUTRITION  Drink plenty of fluids.   You may resume your normal diet as instructed by your doctor.   Begin with a light meal and progress to your normal diet. Heavy or fried foods are harder to digest and may make you feel sick to your stomach (nauseated).   Avoid alcoholic beverages for 24 hours or as instructed.  MEDICATIONS  You may resume your normal medications unless your doctor tells you otherwise.  WHAT YOU CAN EXPECT TODAY  Some feelings of bloating in the abdomen.   Passage of more gas than usual.   Spotting of blood in your stool or on the toilet paper.  IF YOU HAD POLYPS REMOVED DURING THE COLONOSCOPY:  No aspirin products for 7 days or as instructed.   No alcohol for 7 days or as instructed.   Eat a soft diet for the next 24 hours.  FINDING OUT THE RESULTS OF YOUR TEST Not all test results are available during your visit. If your test results are not back during the visit, make an appointment  with your caregiver to find out the results. Do not assume everything is normal if you have not heard from your caregiver or the medical facility. It is important for you to follow up on all of your test results.  SEEK IMMEDIATE MEDICAL ATTENTION IF:  You have more than a spotting of blood in your stool.   Your belly is swollen (abdominal distention).   You are nauseated or vomiting.   You have a temperature over 101.   You have abdominal pain or discomfort that is severe or gets worse throughout the day.   Resume Coumadin today  Repeat colonoscopy in 5 years

## 2014-12-15 NOTE — Interval H&P Note (Signed)
History and Physical Interval Note:  12/15/2014 1:03 PM  Darlene Howard  has presented today for surgery, with the diagnosis of HISTORY OF CRC  The various methods of treatment have been discussed with the patient and family. After consideration of risks, benefits and other options for treatment, the patient has consented to  Procedure(s) with comments: COLONOSCOPY (N/A) - 130  as a surgical intervention .  The patient's history has been reviewed, patient examined, no change in status, stable for surgery.  I have reviewed the patient's chart and labs.  Questions were answered to the patient's satisfaction.     Lynk Marti  No change. Colonoscopy per plan. Coumadin held 12/11;  The risks, benefits, limitations, alternatives and imponderables have been reviewed with the patient. Questions have been answered. All parties are agreeable.

## 2014-12-15 NOTE — Op Note (Signed)
[place], [id number]   COLONOSCOPY PROCEDURE REPORT  PATIENT: Darlene Howard, Darlene Howard  MR#: [id number] BIRTHDATE: 02-22-1951 , 34  yrs. old GENDER: female ENDOSCOPIST: R.  Garfield Cornea, MD FACP [facility] REFERRED ES:[id number] Moshe Cipro, M.D.  Curt Bears, M.D. PROCEDURE DATE:  12/24/2014 PROCEDURE:   Colonoscopy, surveillance INDICATIONS:History of colon cancer; status post right hemicolectomy. MEDICATIONS: Versed 4 mg IV and Demerol 75 mg IV in divided doses. ASA CLASS:       Class III  CONSENT: The risks, benefits, alternatives and imponderables including but not limited to bleeding, perforation as well as the possibility of a missed lesion have been reviewed.  The potential for biopsy, lesion removal, etc. have also been discussed. Questions have been answered.  All parties agreeable.  Please see the history and physical in the medical record for more information.  DESCRIPTION OF PROCEDURE:   After the risks benefits and alternatives of the procedure were thoroughly explained, informed consent was obtained.  The digital rectal exam revealed no abnormalities of the rectum.   The EC-[id number] ([id number])  endoscope was introduced through the anus and advanced to the surgical anastomosis. No adverse events experienced.   The quality of the prep was     The instrument was then slowly withdrawn as the colon was fully examined.      COLON FINDINGS: The residual colonic mucosa appeared normal. Normal-appearing rectal mucosa.  Retroflexion was performed. .  Withdrawal time=  .  Cecal withdrawal time not applicable.    The scope was withdrawn and the procedure completed. COMPLICATIONS: There were no immediate complications.  ENDOSCOPIC IMPRESSION: Status post right hemicolectomy; residual rectal colonic mucosa appeared normal.  RECOMMENDATIONS: Repeat surveillance examination in 5 years.  eSigned:  R. Garfield Cornea, MD [facility] Dec 24, 2014 1:39  PM   cc:  CPT CODES: ICD CODES:  The ICD and CPT codes recommended by this software are interpretations from the data that the clinical staff has captured with the software.  The verification of the translation of this report to the ICD and CPT codes and modifiers is the sole responsibility of the health care institution and practicing physician where this report was generated.  [employer] will not be held responsible for the validity of the ICD and CPT codes included on this report.  AMA assumes no liability for data contained or not contained herein. CPT is a Designer, television/film set of the [employer].

## 2014-12-17 ENCOUNTER — Encounter (HOSPITAL_COMMUNITY): Payer: Self-pay | Admitting: Internal Medicine

## 2014-12-27 ENCOUNTER — Encounter: Payer: Self-pay | Admitting: *Deleted

## 2014-12-28 ENCOUNTER — Other Ambulatory Visit: Payer: Self-pay | Admitting: *Deleted

## 2014-12-28 DIAGNOSIS — C9000 Multiple myeloma not having achieved remission: Secondary | ICD-10-CM

## 2014-12-28 NOTE — Telephone Encounter (Signed)
THIS REFILL REQUEST FOR REVLIMID WAS GIVEN TO DR.MOHAMED'S NURSE, [place]

## 2014-12-29 ENCOUNTER — Other Ambulatory Visit (HOSPITAL_BASED_OUTPATIENT_CLINIC_OR_DEPARTMENT_OTHER): Payer: Medicare Other

## 2014-12-29 ENCOUNTER — Other Ambulatory Visit: Payer: Self-pay | Admitting: *Deleted

## 2014-12-29 ENCOUNTER — Ambulatory Visit: Payer: Medicare Other | Admitting: Gastroenterology

## 2014-12-29 DIAGNOSIS — C9000 Multiple myeloma not having achieved remission: Secondary | ICD-10-CM

## 2014-12-29 LAB — CBC WITH DIFFERENTIAL/PLATELET
BASO%: 0.6 % (ref 0.0–2.0)
Basophils Absolute: 0.1 10*3/uL (ref 0.0–0.1)
EOS ABS: 0.2 10*3/uL (ref 0.0–0.5)
EOS%: 1.8 % (ref 0.0–7.0)
HCT: 33 % — ABNORMAL LOW (ref 34.8–46.6)
HEMOGLOBIN: 10.4 g/dL — AB (ref 11.6–15.9)
LYMPH%: 11.6 % — AB (ref 14.0–49.7)
MCH: 30.6 pg (ref 25.1–34.0)
MCHC: 31.5 g/dL (ref 31.5–36.0)
MCV: 97.1 fL (ref 79.5–101.0)
MONO#: 1.3 10*3/uL — ABNORMAL HIGH (ref 0.1–0.9)
MONO%: 12.1 % (ref 0.0–14.0)
NEUT%: 73.9 % (ref 38.4–76.8)
NEUTROS ABS: 7.7 10*3/uL — AB (ref 1.5–6.5)
PLATELETS: 162 10*3/uL (ref 145–400)
RBC: 3.4 10*6/uL — ABNORMAL LOW (ref 3.70–5.45)
RDW: 16 % — AB (ref 11.2–14.5)
WBC: 10.4 10*3/uL — ABNORMAL HIGH (ref 3.9–10.3)
lymph#: 1.2 10*3/uL (ref 0.9–3.3)

## 2014-12-29 LAB — TECHNOLOGIST REVIEW

## 2014-12-29 LAB — COMPREHENSIVE METABOLIC PANEL (CC13)
ALBUMIN: 3.1 g/dL — AB (ref 3.5–5.0)
ALK PHOS: 63 U/L (ref 40–150)
ALT: 31 U/L (ref 0–55)
AST: 23 U/L (ref 5–34)
Anion Gap: 9 mEq/L (ref 3–11)
BUN: 8 mg/dL (ref 7.0–26.0)
CALCIUM: 9.4 mg/dL (ref 8.4–10.4)
CHLORIDE: 104 meq/L (ref 98–109)
CO2: 29 mEq/L (ref 22–29)
CREATININE: 0.9 mg/dL (ref 0.6–1.1)
EGFR: 79 mL/min/{1.73_m2} — ABNORMAL LOW (ref >90)
Glucose: 152 mg/dl — ABNORMAL HIGH (ref 70–140)
Potassium: 3.6 mEq/L (ref 3.5–5.1)
Sodium: 142 mEq/L (ref 136–145)
Total Bilirubin: 1.18 mg/dL (ref 0.20–1.20)
Total Protein: 7 g/dL (ref 6.4–8.3)

## 2014-12-29 LAB — LACTATE DEHYDROGENASE (CC13): LDH: 178 U/L (ref 125–245)

## 2014-12-29 MED ORDER — LENALIDOMIDE 15 MG PO CAPS: ORAL_CAPSULE | ORAL | Status: DC

## 2015-01-03 LAB — KAPPA/LAMBDA LIGHT CHAINS
KAPPA LAMBDA RATIO: 3.48 — AB (ref 0.26–1.65)
Kappa free light chain: 1.01 mg/dL (ref 0.33–1.94)
LAMBDA FREE LGHT CHN: 0.29 mg/dL — AB (ref 0.57–2.63)

## 2015-01-03 LAB — IGG, IGA, IGM
IGM, SERUM: 11 mg/dL — AB (ref 52–322)
IgA: 1770 mg/dL — ABNORMAL HIGH (ref 69–380)
IgG (Immunoglobin G), Serum: 274 mg/dL — ABNORMAL LOW (ref 690–1700)

## 2015-01-03 LAB — BETA 2 MICROGLOBULIN, SERUM: BETA 2 MICROGLOBULIN: 3.08 mg/L — AB (ref <=2.51)

## 2015-01-05 ENCOUNTER — Encounter: Payer: Self-pay | Admitting: Internal Medicine

## 2015-01-05 ENCOUNTER — Telehealth: Payer: Self-pay | Admitting: Internal Medicine

## 2015-01-05 ENCOUNTER — Ambulatory Visit (HOSPITAL_BASED_OUTPATIENT_CLINIC_OR_DEPARTMENT_OTHER): Payer: Medicare Other | Admitting: Internal Medicine

## 2015-01-05 VITALS — BP 127/62 | HR 68 | Temp 98.2°F | Resp 18 | Ht 66.0 in | Wt 213.9 lb

## 2015-01-05 DIAGNOSIS — C9000 Multiple myeloma not having achieved remission: Secondary | ICD-10-CM

## 2015-01-05 NOTE — Progress Notes (Signed)
Darlene Howard Telephone:([phone]   Fax:(336) [phone] OFFICE PROGRESS NOTE  [name], MD [address], [address] [place] [address]  DIAGNOSIS: Stage IIA IgA kappa multiple myeloma diagnosed in 2006   PRIOR THERAPY:  1) status post treatment with thalidomide and Decadron followed by autologous peripheral blood stem cell transplant with high-dose melphalan on 04/26/2006 at [facility].  2) the patient had evidence for disease recurrence in August of 2008 and she was treated with 6 cycles of Velcade completed on 03/30/2008 with response to the treatment but was discontinued secondary to neuropathy.  3) maintenance treatment with Revlimid 10 mg by mouth daily by the does was later increase to 25 mg by mouth daily secondary to biochemical recurrence with stabilization of her disease.   CURRENT THERAPY:  1) Revlimid 15 mg by mouth daily for 2 weeks every 3 weeks in addition to Decadron currently at 20 mg by mouth on a weekly basis.  2) Zometa 4 mg IV every 3 months. Next dose in 02/02/2015  ADVANCED DIRECTIVE: She does not have advanced directive and was given information.  INTERVAL HISTORY: Darlene Howard 64 y.o. female returns to the clinic today for routine monthly followup visit. She is feeling fine today with no specific complaints. She is tolerating her treatment with Revlimid and Decadron fairly well with no significant adverse effects. She has not received any treatment with Zometa for several years and I'm not sure why it was discontinued by Dr. Jamse Arn. The patient denied having any fever or chills. She denied having any chest pain, shortness of breath, cough or hemoptysis. She has no weight loss or night sweats. She had repeat myeloma panel performed recently and she is here for evaluation and discussion of her lab results.  MEDICAL HISTORY: Past Medical History  Diagnosis Date  . Glaucoma     uses eye drops as instructed  . Personal history  of colon cancer     ASCENDING COLON--  S/P  RIGHT HEMICOLECTOMY WITH NEGATIVE NODES/    NO RECURRENCE  . Herpes     takes Valtrex as needed  . GERD (gastroesophageal reflux disease)     takes Pantoprazole daily  . Hypertension     takes Amlodipine and Diovan daily  . Bruises easily     d/t being on COumadin  . History of colon polyps   . Type 2 diabetes mellitus   . Neuropathy associated with multiple myeloma     POST CHEMOTHERAPY AND STEM CELL TRANSPLANT  . H/O stem cell transplant     04-26-2006  AT [facility]  . Multiple myeloma DX  2006  STATE IIA,  IgA  KAPPA    STABLE  PER DR Cher Franzoni--  S/P STEM CELL TRANSPLANT 2007 /  RECURRENCE 2008  CHEMO ENDED 03-30-2008/   NOW ON MAINTANANCE REVLIMID )  . Vocal cord nodule     ASSESSED BY DR TEOH--  LMA  OK W/ PROCEDURE'S  . DDD (degenerative disc disease), lumbar   . Cholelithiasis   . Fatty liver     ALLERGIES:  is allergic to codeine.  MEDICATIONS:  Current Outpatient Prescriptions  Medication Sig Dispense Refill  . alendronate (FOSAMAX) 70 MG tablet TAKE 1 TAB EACH WEEK 30 MIN PRIOR TO BREAKFAST WITH LARGE GLASS OF WATER ON AN EMPTY STOMACH. REMAIN UPRIGHT. 12 tablet 1  . amLODipine (NORVASC) 2.5 MG tablet TAKE 1 TABLET DAILY---  takes in am 90 tablet 1  .  aspirin (ASPIRIN LOW DOSE) 81 MG EC tablet Take 81 mg by mouth daily.      . bimatoprost (LUMIGAN) 0.03 % ophthalmic drops Place 1 drop into both eyes at bedtime.     . calcium-vitamin D (OSCAL 500/200 D-3) 500-200 MG-UNIT per tablet Take 1 tablet by mouth 3 (three) times daily.      . cholecalciferol (VITAMIN D) 1000 UNITS tablet Take 1,000 Units by mouth daily.    Marland Kitchen conjugated estrogens (PREMARIN) vaginal cream Place 1 Applicatorful vaginally 2 (two) times a week. 42.5 g 12  . dexamethasone (DECADRON) 4 MG tablet 5 tablets by mouth on a weekly basis. 20 tablet 2  . dorzolamide-timolol (COSOPT) 22.3-6.8 MG/ML ophthalmic solution Place 1 drop into both eyes 2 (two) times daily.    Marland Kitchen  gabapentin (NEURONTIN) 100 MG capsule Take 1 capsule (100 mg total) by mouth 3 (three) times daily. 270 capsule 1  . lenalidomide (REVLIMID) 15 MG capsule Take one capsule daily for 14 days.  Then 7 days off. 14 capsule 0  . metFORMIN (GLUCOPHAGE) 1000 MG tablet Take 1 tablet (1,000 mg total) by mouth daily with breakfast. (Patient taking differently: Take 1,000 mg by mouth 2 (two) times daily with a meal. ) 90 tablet 1  . Multiple Vitamins-Minerals (CENTRUM SILVER PO) Take 1 tablet by mouth daily.     . ONE TOUCH ULTRA TEST test strip USE AS DIRECTED TO CHECK DAILY. 50 each 3  . pantoprazole (PROTONIX) 40 MG tablet TAKE ONE TABLET BY MOUTH EVERY DAY FOR ACID REFLUX. 90 tablet 1  . potassium chloride SA (K-DUR,KLOR-CON) 20 MEQ tablet TAKE 1 TABLET BY MOUTH TWICE DAILY. 180 tablet 1  . valACYclovir (VALTREX) 1000 MG tablet TAKE ONE TABLET BY MOUTH DAILY AS NEEDED. 90 tablet 1  . valsartan-hydrochlorothiazide (DIOVAN-HCT) 320-25 MG per tablet TAKE ONE TABLET BY MOUTH DAILY. 90 tablet 1  . warfarin (COUMADIN) 2 MG tablet Take 1 tablet by mouth  daily 30 tablet 2   No current facility-administered medications for this visit.    SURGICAL HISTORY:  Past Surgical History  Procedure Laterality Date  . Colonoscopy  10/07/2006    GLO:[id number] rectum/Status post right hemicolectomy. Normal residual colon  . Cervical cone biopsy    . Cataract extraction w/phaco Left 01/20/2014    Procedure: CATARACT EXTRACTION PHACO AND INTRAOCULAR LENS PLACEMENT (IOC);  Surgeon: Adonis Brook, MD;  Location: [place];  Service: Ophthalmology;  Laterality: Left;  . 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 Co2  ablation vulva perianal and vaginal dysplatic areas  02-15-2010  . Abdominal hysterectomy  1983    w/  appendectomy and left salpingoophorectom  . Hemicolectomy Right 04-27-2005    CARCINOMA   ASCENDING COLON  . Vulvectomy N/A 05/06/2014    Procedure: WIDE LOCAL EXCISION LEFT LABIA MAJORA;  Surgeon: Janie Morning, MD;  Location: [facility];  Service: Gynecology;  Laterality: N/A;  . Vulvar lesion removal N/A 05/06/2014    Procedure: LASER OF VULVAR LESION;  Surgeon: Janie Morning, MD;  Location: [facility];  Service: Gynecology;  Laterality: N/A;  . Lesion removal N/A 05/06/2014    Procedure: LASER OF THE VAGINA;  Surgeon: Janie Morning, MD;  Location: [facility];  Service: Gynecology;  Laterality: N/A;  . Esophagogastroduodenoscopy  04/02/2005    IRJ:[id number]/[id number] polyp with central erosion versus ulceration in the body of the stomach, resected and recovered Remainder of the gastric mucosa, D1, D2 appeared  normal  . Colonoscopy  10/2009    RMR: diminutive RS polyp (hyperplastic).   . Colonoscopy N/A 12/15/2014    Procedure: COLONOSCOPY;  Surgeon: Corbin Ade, MD;  [facility];  Service: Endoscopy;  Laterality: N/A;  130     REVIEW OF SYSTEMS:  Constitutional: negative Eyes: negative Ears, nose, mouth, throat, and face: negative Respiratory: negative Cardiovascular: negative Gastrointestinal: negative Genitourinary:negative Integument/breast: negative Hematologic/lymphatic: negative Musculoskeletal:positive for Left shoulder pain Neurological: negative Behavioral/Psych: negative Endocrine: negative Allergic/Immunologic: negative   PHYSICAL EXAMINATION: General appearance: alert, cooperative and no distress Head: Normocephalic, without obvious abnormality, atraumatic Neck: no adenopathy Lymph nodes: Cervical, supraclavicular, and axillary nodes normal. Resp: clear to auscultation bilaterally Back: symmetric, no curvature. ROM normal. No CVA tenderness. Cardio: regular rate and rhythm, S1, S2 normal, no murmur, click, rub or gallop GI: soft, non-tender; bowel sounds normal; no masses,  no organomegaly Extremities: extremities normal, atraumatic, no cyanosis or edema Neurologic: Alert and oriented X 3, normal  strength and tone. Normal symmetric reflexes. Normal coordination and gait  ECOG PERFORMANCE STATUS: 1 - Symptomatic but completely ambulatory  There were no vitals taken for this visit.  LABORATORY DATA: Lab Results  Component Value Date   WBC 10.4* 12/29/2014   HGB 10.4* 12/29/2014   HCT 33.0* 12/29/2014   MCV 97.1 12/29/2014   PLT 162 12/29/2014      Chemistry      Component Value Date/Time   NA 142 12/29/2014 1236   NA 140 10/27/2014 1038   K 3.6 12/29/2014 1236   K 3.9 10/27/2014 1038   CL 103 10/27/2014 1038   CL 105 06/17/2013 1441   CO2 29 12/29/2014 1236   CO2 27 10/27/2014 1038   BUN 8.0 12/29/2014 1236   BUN 13 10/27/2014 1038   CREATININE 0.9 12/29/2014 1236   CREATININE 0.73 10/27/2014 1038   CREATININE 0.81 01/14/2014 1413      Component Value Date/Time   CALCIUM 9.4 12/29/2014 1236   CALCIUM 9.6 10/27/2014 1038   ALKPHOS 63 12/29/2014 1236   ALKPHOS 62 10/27/2014 1038   AST 23 12/29/2014 1236   AST 33 10/27/2014 1038   ALT 31 12/29/2014 1236   ALT 34 10/27/2014 1038   BILITOT 1.18 12/29/2014 1236   BILITOT 1.4* 10/27/2014 1038     Other lab results: Beta-2 microglobulin 3.08, free kappa Light chain 1.01, free lambda light chain 0.29 with a kappa/lambda ratio 3.48. IgG 274, IgA 1770 and IgM 11.  RADIOGRAPHIC STUDIES: No results found.  ASSESSMENT AND PLAN: This is a very pleasant 64 years old [place] American female with history of multiple myeloma currently on treatment with Revlimid and low-dose Decadron and tolerating her treatment fairly well. The patient is doing fine today with no specific complaints.  The recent myeloma panel showed no significant evidence for disease progression. I discussed the lab result with the patient today. I recommended for the patient to continue her current treatment with Revlimid and Decadron. I also recommended for the patient to resume her treatment with Zometa 4 mg IV every 3 months. She will start the first  dose of this treatment next month. She will come back for follow-up visit in one month's for reevaluation. She was advised to call immediately if she has any concerning symptoms and interval. The patient voices understanding of current disease status and treatment options and is in agreement with the current care plan.  All questions were answered. The patient knows to call the clinic with any  problems, questions or concerns. We can certainly see the patient much sooner if necessary.  Disclaimer: This note was dictated with voice recognition software. Similar sounding words can inadvertently be transcribed and may not be corrected upon review.

## 2015-01-05 NOTE — Telephone Encounter (Signed)
gv and printed appt sched and avs for pt for Feb

## 2015-01-10 ENCOUNTER — Other Ambulatory Visit: Payer: Self-pay | Admitting: Family Medicine

## 2015-01-10 ENCOUNTER — Other Ambulatory Visit: Payer: Self-pay | Admitting: Internal Medicine

## 2015-01-10 DIAGNOSIS — C9000 Multiple myeloma not having achieved remission: Secondary | ICD-10-CM

## 2015-01-14 ENCOUNTER — Other Ambulatory Visit: Payer: Self-pay | Admitting: Family Medicine

## 2015-01-24 ENCOUNTER — Other Ambulatory Visit: Payer: Self-pay | Admitting: Medical Oncology

## 2015-01-24 DIAGNOSIS — C9000 Multiple myeloma not having achieved remission: Secondary | ICD-10-CM

## 2015-01-24 MED ORDER — LENALIDOMIDE 15 MG PO CAPS: 15.0000 mg | ORAL_CAPSULE | Freq: Every day | ORAL | Status: DC

## 2015-01-24 NOTE — Progress Notes (Signed)
Faxed revlomid refill

## 2015-02-01 ENCOUNTER — Telehealth: Payer: Self-pay | Admitting: Medical Oncology

## 2015-02-01 NOTE — Telephone Encounter (Signed)
No contraindications for zometa per Aquilla Solian DDS.

## 2015-02-01 NOTE — Telephone Encounter (Signed)
Pt saw dentist yesterday . I requested dental clearance.

## 2015-02-02 ENCOUNTER — Encounter: Payer: Self-pay | Admitting: Internal Medicine

## 2015-02-02 ENCOUNTER — Telehealth: Payer: Self-pay | Admitting: Internal Medicine

## 2015-02-02 ENCOUNTER — Ambulatory Visit (HOSPITAL_BASED_OUTPATIENT_CLINIC_OR_DEPARTMENT_OTHER): Payer: Medicare Other

## 2015-02-02 ENCOUNTER — Other Ambulatory Visit (HOSPITAL_BASED_OUTPATIENT_CLINIC_OR_DEPARTMENT_OTHER): Payer: Medicare Other

## 2015-02-02 ENCOUNTER — Ambulatory Visit (HOSPITAL_BASED_OUTPATIENT_CLINIC_OR_DEPARTMENT_OTHER): Payer: Medicare Other | Admitting: Internal Medicine

## 2015-02-02 VITALS — BP 142/57 | HR 78 | Temp 98.4°F | Resp 18 | Ht 66.0 in | Wt 217.3 lb

## 2015-02-02 DIAGNOSIS — D010 Carcinoma in situ of colon: Secondary | ICD-10-CM

## 2015-02-02 DIAGNOSIS — C9000 Multiple myeloma not having achieved remission: Secondary | ICD-10-CM

## 2015-02-02 LAB — CBC WITH DIFFERENTIAL/PLATELET
BASO%: 0.5 % (ref 0.0–2.0)
BASOS ABS: 0 10*3/uL (ref 0.0–0.1)
EOS ABS: 0.2 10*3/uL (ref 0.0–0.5)
EOS%: 2.7 % (ref 0.0–7.0)
HCT: 31.4 % — ABNORMAL LOW (ref 34.8–46.6)
HGB: 9.9 g/dL — ABNORMAL LOW (ref 11.6–15.9)
LYMPH%: 11.9 % — ABNORMAL LOW (ref 14.0–49.7)
MCH: 30.4 pg (ref 25.1–34.0)
MCHC: 31.5 g/dL (ref 31.5–36.0)
MCV: 96.3 fL (ref 79.5–101.0)
MONO#: 0.5 10*3/uL (ref 0.1–0.9)
MONO%: 8.4 % (ref 0.0–14.0)
NEUT#: 4.6 10*3/uL (ref 1.5–6.5)
NEUT%: 76.5 % (ref 38.4–76.8)
PLATELETS: 118 10*3/uL — AB (ref 145–400)
RBC: 3.26 10*6/uL — ABNORMAL LOW (ref 3.70–5.45)
RDW: 16.4 % — ABNORMAL HIGH (ref 11.2–14.5)
WBC: 6 10*3/uL (ref 3.9–10.3)
lymph#: 0.7 10*3/uL — ABNORMAL LOW (ref 0.9–3.3)
nRBC: 2 % — ABNORMAL HIGH (ref 0–0)

## 2015-02-02 LAB — TECHNOLOGIST REVIEW

## 2015-02-02 LAB — COMPREHENSIVE METABOLIC PANEL (CC13)
ALT: 26 U/L (ref 0–55)
AST: 23 U/L (ref 5–34)
Albumin: 3.1 g/dL — ABNORMAL LOW (ref 3.5–5.0)
Alkaline Phosphatase: 61 U/L (ref 40–150)
Anion Gap: 10 mEq/L (ref 3–11)
BUN: 9.1 mg/dL (ref 7.0–26.0)
CO2: 24 mEq/L (ref 22–29)
Calcium: 8.7 mg/dL (ref 8.4–10.4)
Chloride: 106 mEq/L (ref 98–109)
Creatinine: 0.8 mg/dL (ref 0.6–1.1)
GLUCOSE: 127 mg/dL (ref 70–140)
Potassium: 3.7 mEq/L (ref 3.5–5.1)
SODIUM: 139 meq/L (ref 136–145)
Total Bilirubin: 1.26 mg/dL — ABNORMAL HIGH (ref 0.20–1.20)
Total Protein: 6.8 g/dL (ref 6.4–8.3)

## 2015-02-02 MED ORDER — ZOLEDRONIC ACID 4 MG/100ML IV SOLN
4.0000 mg | Freq: Once | INTRAVENOUS | Status: AC
  Administered 2015-02-02: 4 mg via INTRAVENOUS
  Filled 2015-02-02: qty 100

## 2015-02-02 MED ORDER — SODIUM CHLORIDE 0.9 % IV SOLN
Freq: Once | INTRAVENOUS | Status: AC
  Administered 2015-02-02: 15:00:00 via INTRAVENOUS

## 2015-02-02 NOTE — Patient Instructions (Signed)

## 2015-02-02 NOTE — Telephone Encounter (Signed)
Pt confirmed labs/ov per 02/03 POF, gave pt AVS.... KJ °

## 2015-02-02 NOTE — Progress Notes (Signed)
Darlene Howard:(336) [phone]   Fax:(336) [phone] OFFICE PROGRESS NOTE  [name], MD [address], [address] [place] [address]  DIAGNOSIS: Stage IIA IgA kappa multiple myeloma diagnosed in 2006   PRIOR THERAPY:  1) status post treatment with thalidomide and Decadron followed by autologous peripheral blood stem cell transplant with high-dose melphalan on 04/26/2006 at [facility].  2) the patient had evidence for disease recurrence in August of 2008 and she was treated with 6 cycles of Velcade completed on 03/30/2008 with response to the treatment but was discontinued secondary to neuropathy.  3) maintenance treatment with Revlimid 10 mg by mouth daily by the does was later increase to 25 mg by mouth daily secondary to biochemical recurrence with stabilization of her disease.   CURRENT THERAPY:  1) Revlimid 15 mg by mouth daily for 2 weeks every 3 weeks in addition to Decadron currently at 20 mg by mouth on a weekly basis.  2) Zometa 4 mg IV every 3 months. Next dose in 02/02/2015  ADVANCED DIRECTIVE: She does not have advanced directive and was given information.  INTERVAL HISTORY: Darlene Howard 64 y.o. female returns to the clinic today for routine monthly followup visit. She is feeling fine today with no specific complaints. She is tolerating her treatment with Revlimid and Decadron fairly well with no significant adverse effects. She had a dental clearance from her dentist recently to resume treatment with Zometa. The patient denied having any fever or chills. She denied having any chest pain, shortness of breath, cough or hemoptysis. She has no weight loss or night sweats.   MEDICAL HISTORY: Past Medical History  Diagnosis Date  . Glaucoma     uses eye drops as instructed  . Personal history of colon cancer     ASCENDING COLON--  S/P  RIGHT HEMICOLECTOMY WITH NEGATIVE NODES/    NO RECURRENCE  . Herpes     takes Valtrex as needed  .  GERD (gastroesophageal reflux disease)     takes Pantoprazole daily  . Hypertension     takes Amlodipine and Diovan daily  . Bruises easily     d/t being on COumadin  . History of colon polyps   . Type 2 diabetes mellitus   . Neuropathy associated with multiple myeloma     POST CHEMOTHERAPY AND STEM CELL TRANSPLANT  . H/O stem cell transplant     04-26-2006  AT [facility]  . Multiple myeloma DX  2006  STATE IIA,  IgA  KAPPA    STABLE  PER DR Gladys Gutman--  S/P STEM CELL TRANSPLANT 2007 /  RECURRENCE 2008  CHEMO ENDED 03-30-2008/   NOW ON MAINTANANCE REVLIMID )  . Vocal cord nodule     ASSESSED BY DR TEOH--  LMA  OK W/ PROCEDURE'S  . DDD (degenerative disc disease), lumbar   . Cholelithiasis   . Fatty liver     ALLERGIES:  is allergic to codeine.  MEDICATIONS:  Current Outpatient Prescriptions  Medication Sig Dispense Refill  . alendronate (FOSAMAX) 70 MG tablet Take 1 tablet by mouth  weekly 36min before  breakfast with large glass  of water on an empty  stomach , remain upright 12 tablet 1  . amLODipine (NORVASC) 2.5 MG tablet Take 1 tablet by mouth  daily in the morning 90 tablet 1  . aspirin (ASPIRIN LOW DOSE) 81 MG EC tablet Take 81 mg by mouth daily.      Marland Kitchen  bimatoprost (LUMIGAN) 0.03 % ophthalmic drops Place 1 drop into both eyes at bedtime.     . calcium-vitamin D (OSCAL 500/200 D-3) 500-200 MG-UNIT per tablet Take 1 tablet by mouth 3 (three) times daily.      . cholecalciferol (VITAMIN D) 1000 UNITS tablet Take 1,000 Units by mouth daily.    Marland Kitchen conjugated estrogens (PREMARIN) vaginal cream Place 1 Applicatorful vaginally 2 (two) times a week. 42.5 g 12  . dexamethasone (DECADRON) 4 MG tablet Take 5 tablets by mouth on  a weekly basis as directed 60 tablet 1  . dorzolamide-timolol (COSOPT) 22.3-6.8 MG/ML ophthalmic solution Place 1 drop into both eyes 2 (two) times daily.    Marland Kitchen gabapentin (NEURONTIN) 100 MG capsule Take 1 capsule (100 mg total) by mouth 3 (three) times daily. 270 capsule  1  . lenalidomide (REVLIMID) 15 MG capsule Take 1 capsule (15 mg total) by mouth daily. Take one capsule daily for 14 days.  Then 7 days off. 14 capsule 0  . loratadine (CLARITIN) 10 MG tablet TAKE ONE TABLET BY MOUTH ONCE DAILY. 30 tablet 2  . metFORMIN (GLUCOPHAGE) 1000 MG tablet Take 1 tablet by mouth  daily with breakfast 90 tablet 1  . Multiple Vitamins-Minerals (CENTRUM SILVER PO) Take 1 tablet by mouth daily.     . ONE TOUCH ULTRA TEST test strip USE AS DIRECTED TO CHECK DAILY. 50 each 3  . pantoprazole (PROTONIX) 40 MG tablet Take 1 tablet by mouth  every day for acid reflux. 90 tablet 1  . potassium chloride SA (K-DUR,KLOR-CON) 20 MEQ tablet Take 1 tablet by mouth  twice a day 180 tablet 1  . valACYclovir (VALTREX) 1000 MG tablet Take 1 tablet by mouth  daily as needed 90 tablet 1  . valsartan-hydrochlorothiazide (DIOVAN-HCT) 320-25 MG per tablet Take 1 tablet by mouth  daily 90 tablet 1  . warfarin (COUMADIN) 2 MG tablet Take 1 tablet by mouth  daily 90 tablet 1   No current facility-administered medications for this visit.    SURGICAL HISTORY:  Past Surgical History  Procedure Laterality Date  . Colonoscopy  10/07/2006    YTK:ZSWFUX rectum/Status post right hemicolectomy. Normal residual colon  . Cervical cone biopsy    . Cataract extraction w/phaco Left 01/20/2014    Procedure: CATARACT EXTRACTION PHACO AND INTRAOCULAR LENS PLACEMENT (IOC);  Surgeon: Adonis Brook, MD;  Location: [place];  Service: Ophthalmology;  Laterality: Left;  . 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 Co2  ablation vulva perianal and vaginal dysplatic areas  02-15-2010  . Abdominal hysterectomy  1983    w/  appendectomy and left salpingoophorectom  . Hemicolectomy Right 04-27-2005    CARCINOMA   ASCENDING COLON  . Vulvectomy N/A 05/06/2014    Procedure: WIDE LOCAL EXCISION LEFT LABIA MAJORA;  Surgeon: Janie Morning, MD;  Location: [facility];  Service: Gynecology;  Laterality:  N/A;  . Vulvar lesion removal N/A 05/06/2014    Procedure: LASER OF VULVAR LESION;  Surgeon: Janie Morning, MD;  Location: [facility];  Service: Gynecology;  Laterality: N/A;  . Lesion removal N/A 05/06/2014    Procedure: LASER OF THE VAGINA;  Surgeon: Janie Morning, MD;  Location: [facility];  Service: Gynecology;  Laterality: N/A;  . Esophagogastroduodenoscopy  04/02/2005    NAT:[id number] polyp with central erosion versus ulceration in the body of the stomach, resected and recovered Remainder of the gastric mucosa, D1, D2 appeared normal  .  Colonoscopy  10/2009    RMR: diminutive RS polyp (hyperplastic).   . Colonoscopy N/A 12/15/2014    Procedure: COLONOSCOPY;  Surgeon: Daneil Dolin, MD;  [facility];  Service: Endoscopy;  Laterality: N/A;  130     REVIEW OF SYSTEMS:  Constitutional: negative Eyes: negative Ears, nose, mouth, throat, and face: negative Respiratory: negative Cardiovascular: negative Gastrointestinal: negative Genitourinary:negative Integument/breast: negative Hematologic/lymphatic: negative Musculoskeletal:positive for Left shoulder pain Neurological: negative Behavioral/Psych: negative Endocrine: negative Allergic/Immunologic: negative   PHYSICAL EXAMINATION: General appearance: alert, cooperative and no distress Head: Normocephalic, without obvious abnormality, atraumatic Neck: no adenopathy Lymph nodes: Cervical, supraclavicular, and axillary nodes normal. Resp: clear to auscultation bilaterally Back: symmetric, no curvature. ROM normal. No CVA tenderness. Cardio: regular rate and rhythm, S1, S2 normal, no murmur, click, rub or gallop GI: soft, non-tender; bowel sounds normal; no masses,  no organomegaly Extremities: extremities normal, atraumatic, no cyanosis or edema Neurologic: Alert and oriented X 3, normal strength and tone. Normal symmetric reflexes. Normal coordination and gait  ECOG PERFORMANCE  STATUS: 1 - Symptomatic but completely ambulatory  There were no vitals taken for this visit.  LABORATORY DATA: Lab Results  Component Value Date   WBC 6.0 02/02/2015   HGB 9.9* 02/02/2015   HCT 31.4* 02/02/2015   MCV 96.3 02/02/2015   PLT 118* 02/02/2015      Chemistry      Component Value Date/Time   NA 142 12/29/2014 1236   NA 140 10/27/2014 1038   K 3.6 12/29/2014 1236   K 3.9 10/27/2014 1038   CL 103 10/27/2014 1038   CL 105 06/17/2013 1441   CO2 29 12/29/2014 1236   CO2 27 10/27/2014 1038   BUN 8.0 12/29/2014 1236   BUN 13 10/27/2014 1038   CREATININE 0.9 12/29/2014 1236   CREATININE 0.73 10/27/2014 1038   CREATININE 0.81 01/14/2014 1413      Component Value Date/Time   CALCIUM 9.4 12/29/2014 1236   CALCIUM 9.6 10/27/2014 1038   ALKPHOS 63 12/29/2014 1236   ALKPHOS 62 10/27/2014 1038   AST 23 12/29/2014 1236   AST 33 10/27/2014 1038   ALT 31 12/29/2014 1236   ALT 34 10/27/2014 1038   BILITOT 1.18 12/29/2014 1236   BILITOT 1.4* 10/27/2014 1038     Other lab results: Beta-2 microglobulin 3.08, free kappa Light chain 1.01, free lambda light chain 0.29 with a kappa/lambda ratio 3.48. IgG 274, IgA 1770 and IgM 11.  RADIOGRAPHIC STUDIES: No results found.  ASSESSMENT AND PLAN: This is a very pleasant 64 years old [place] American female with history of multiple myeloma currently on treatment with Revlimid and low-dose Decadron and tolerating her treatment fairly well. The patient is doing fine today with no specific complaints.  I recommended for the patient to continue her current treatment with Revlimid and Decadron. She will resume her treatment with Zometa today after we received a dental clearance from her dentist. She will come back for follow-up visit in one month for reevaluation. She was advised to call immediately if she has any concerning symptoms and interval. The patient voices understanding of current disease status and treatment options and is in  agreement with the current care plan.  All questions were answered. The patient knows to call the clinic with any problems, questions or concerns. We can certainly see the patient much sooner if necessary.  Disclaimer: This note was dictated with voice recognition software. Similar sounding words can inadvertently be transcribed and may not be  corrected upon review.

## 2015-02-03 ENCOUNTER — Telehealth: Payer: Self-pay | Admitting: Internal Medicine

## 2015-02-03 NOTE — Telephone Encounter (Signed)
returned call adn s.w pt and r/s appt per pt request....pt aware of new d.t

## 2015-02-10 ENCOUNTER — Other Ambulatory Visit: Payer: Self-pay | Admitting: *Deleted

## 2015-02-10 DIAGNOSIS — C9000 Multiple myeloma not having achieved remission: Secondary | ICD-10-CM

## 2015-02-10 MED ORDER — LENALIDOMIDE 15 MG PO CAPS: 15.0000 mg | ORAL_CAPSULE | Freq: Every day | ORAL | Status: DC

## 2015-02-28 ENCOUNTER — Ambulatory Visit: Payer: Medicare Other | Attending: Gynecologic Oncology | Admitting: Gynecologic Oncology

## 2015-02-28 ENCOUNTER — Other Ambulatory Visit: Payer: Self-pay | Admitting: Medical Oncology

## 2015-02-28 ENCOUNTER — Encounter: Payer: Self-pay | Admitting: Gynecologic Oncology

## 2015-02-28 VITALS — BP 131/52 | HR 89 | Temp 98.3°F | Resp 18 | Ht 66.0 in | Wt 211.8 lb

## 2015-02-28 DIAGNOSIS — C9000 Multiple myeloma not having achieved remission: Secondary | ICD-10-CM | POA: Diagnosis not present

## 2015-02-28 DIAGNOSIS — R87620 Atypical squamous cells of undetermined significance on cytologic smear of vagina (ASC-US): Secondary | ICD-10-CM | POA: Insufficient documentation

## 2015-02-28 DIAGNOSIS — N939 Abnormal uterine and vaginal bleeding, unspecified: Secondary | ICD-10-CM

## 2015-02-28 DIAGNOSIS — S30814A Abrasion of vagina and vulva, initial encounter: Secondary | ICD-10-CM | POA: Insufficient documentation

## 2015-02-28 DIAGNOSIS — D072 Carcinoma in situ of vagina: Secondary | ICD-10-CM

## 2015-02-28 DIAGNOSIS — Z90710 Acquired absence of both cervix and uterus: Secondary | ICD-10-CM | POA: Diagnosis not present

## 2015-02-28 DIAGNOSIS — Z9079 Acquired absence of other genital organ(s): Secondary | ICD-10-CM | POA: Insufficient documentation

## 2015-02-28 DIAGNOSIS — Z87412 Personal history of vulvar dysplasia: Secondary | ICD-10-CM | POA: Diagnosis not present

## 2015-02-28 DIAGNOSIS — D071 Carcinoma in situ of vulva: Secondary | ICD-10-CM | POA: Diagnosis not present

## 2015-02-28 DIAGNOSIS — N941 Dyspareunia: Secondary | ICD-10-CM | POA: Insufficient documentation

## 2015-02-28 DIAGNOSIS — X58XXXA Exposure to other specified factors, initial encounter: Secondary | ICD-10-CM | POA: Diagnosis not present

## 2015-02-28 DIAGNOSIS — N952 Postmenopausal atrophic vaginitis: Secondary | ICD-10-CM | POA: Diagnosis not present

## 2015-02-28 MED ORDER — LENALIDOMIDE 15 MG PO CAPS: 15.0000 mg | ORAL_CAPSULE | Freq: Every day | ORAL | Status: DC

## 2015-02-28 NOTE — Progress Notes (Signed)
revlimid refill done.

## 2015-02-28 NOTE — Progress Notes (Signed)
GYN ONCOLOGY OFFICE VISIT    Darlene Howard 64 y.o. female  CC: VAIN III VIN III , vaginal bleeding  Assessment/Plan:Darlene Howard  is a 63 y.o. with previous history of multifocal genital dysplasia who had new vaginal bleeding after premarin use. Most recent biopsies in 12/15 were normal. Colonoscopy 12/15 normal. Vaginal atrophy and excoriation visible. Likely bleeding we secondary to trauma from premarin applicator. Recommend continued premarin use.      Evaluation 02/2014 that included colposcopy of the vagina and vulva is noteworthy for the presence of vaginal and vulvar dysplasia.  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  On 05/06/2014 she underwent CO2 laser/excision of vulvar lesions and CO2 laser ablation of the vaginal lesion.  Pap 10/2014 ASCUS hrHPV + Vaginal and vulvar colposcopies performed todat Will call with the results of vaginal and vulvar biopsies.  If VIN VAIN detected will treat with co2 laser in 12/2014 Follow-up with Gyn Onc in 9 months  Vaginal atrophy Vaginal premarin cream Rx.    HPI: Darlene Howard  is a 64 y.o.  with IgA multiple myeloma under the care of Dr.Mohammed Mohammed. 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   In 02/15/2010 she underwent extensive CO2 laser ablation of the vulva perianal and vaginal dysplastic areas. A Pap test subsequently in June of 2000 the level returned with evidence of low-grade squamous intraepithelial lesion. Colposcopy was then performed with biopsy for September of 2011 was consistent with vain 1. Darlene Howard has not been seen since.  A Pap test was collected on 02/22/2014 was notable for atypical squamous cells of undetermined significance with high risk HPV detected. Patient seen 03/18/2014 vaginal and vulvar colposcopy with biopsies were  performed.. 1. Labium, biopsy,  VIN-III, EXTENDING TO THE EDGE OF THE BIOPSY. 2. Vulva, biopsy, posterior fourchette - VIN-III,  3. Vagina,  biopsy, right lateral apex - VAIN-III.  On 05/06/2014 she underwent extensive multifocal CO2 laser of the vulva and vagina and WLE of a left vulvar lesion. Path Vulva, excision, inferior left labia majora (VIN-III/CIS), MARGINS NOT INVOLVED.  Pap collected 11/11/2014 ASCUS hrHPVDNA+.  Vaginal colposcopy and biopsies December 1st were negative for dysplasia.  Experienced an episode of bright red vaginal bleeding after use of the premarin applicator last week.    Past Surgical Hx:  Past Surgical History  Procedure Laterality Date  . Colonoscopy  10/07/2006    KGU:[id number] rectum/Status post right hemicolectomy. Normal residual colon  . Cervical cone biopsy    . Cataract extraction w/phaco Left 01/20/2014    Procedure: CATARACT EXTRACTION PHACO AND INTRAOCULAR LENS PLACEMENT (IOC);  Surgeon: Adonis Brook, MD;  Location: [facility];  Service: Ophthalmology;  Laterality: Left;  . 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 Co2  ablation vulva perianal and vaginal dysplatic areas  02-15-2010  . Abdominal hysterectomy  1983    w/  appendectomy and left salpingoophorectom  . Hemicolectomy Right 04-27-2005    CARCINOMA   ASCENDING COLON  . Vulvectomy N/A 05/06/2014    Procedure: WIDE LOCAL EXCISION LEFT LABIA MAJORA;  Surgeon: Janie Morning, MD;  Location: [facility];  Service: Gynecology;  Laterality: N/A;  . Vulvar lesion removal N/A 05/06/2014    Procedure: LASER OF VULVAR LESION;  Surgeon: Janie Morning, MD;  Location: [facility];  Service: Gynecology;  Laterality: N/A;  . Lesion removal N/A 05/06/2014    Procedure: LASER OF THE  VAGINA;  Surgeon: Janie Morning, MD;  Location: [facility];  Service: Gynecology;  Laterality: N/A;  . Esophagogastroduodenoscopy  04/02/2005    GEX:[id number] polyp with central erosion versus ulceration in the body of the stomach, resected and recovered Remainder of the gastric mucosa, D1, D2 appeared normal   . Colonoscopy  10/2009    RMR: diminutive RS polyp (hyperplastic).   . Colonoscopy N/A 12/15/2014    Procedure: COLONOSCOPY;  Surgeon: Daneil Dolin, MD;  [facility];  Service: Endoscopy;  Laterality: N/A;  130     Past Medical Hx:  Past Medical History  Diagnosis Date  . Glaucoma     uses eye drops as instructed  . Personal history of colon cancer     ASCENDING COLON--  S/P  RIGHT HEMICOLECTOMY WITH NEGATIVE NODES/    NO RECURRENCE  . Herpes     takes Valtrex as needed  . GERD (gastroesophageal reflux disease)     takes Pantoprazole daily  . Hypertension     takes Amlodipine and Diovan daily  . Bruises easily     d/t being on COumadin  . History of colon polyps   . Type 2 diabetes mellitus   . Neuropathy associated with multiple myeloma     POST CHEMOTHERAPY AND STEM CELL TRANSPLANT  . H/O stem cell transplant     04-26-2006  AT [facility]  . Multiple myeloma DX  2006  STATE IIA,  IgA  KAPPA    STABLE  PER DR MOHAMED--  S/P STEM CELL TRANSPLANT 2007 /  RECURRENCE 2008  CHEMO ENDED 03-30-2008/   NOW ON MAINTANANCE REVLIMID )  . Vocal cord nodule     ASSESSED BY DR TEOH--  LMA  OK W/ PROCEDURE'S  . DDD (degenerative disc disease), lumbar   . Cholelithiasis   . Fatty liver     Past Gynecological History:   No LMP recorded. Patient has had a hysterectomy.  Family Hx:  Family History  Problem Relation Age of Onset  . Kidney failure Mother   . Hypertension Mother   . Prostate cancer Father   . Hypertension Father     vascular disease   . Stroke Father   . Hypertension Sister   . Hypertension Sister   . Hypertension Brother   . Hyperlipidemia Brother   . Colon cancer Neg Hx   . Breast cancer Neg Hx     Review of Systems:  Constitutional  Feels well,   Genito Urinary  No frequency, urgency, dysuria, no pruritis or bleeding. Denies vaginal discharge.  Reports vaginal dryness and dyspareunia.  Does not use premarin vaginal cream.   Physical Exam:BP  131/52 mmHg  Pulse 89  Temp(Src) 98.3 F (36.8 C) (Oral)  Resp 18  Ht 5' 6" (1.676 m)  Wt 211 lb 12.8 oz (96.072 kg)  BMI 34.20 kg/m2  WD in NAD Normoactive bowel sounds, abdomen soft, non-tender and obese.   Genito Urinary   Vulva/vagina: Vaginal atrophy at apex with bright red bleeding with excoriation with manipulation of speculum. No lesions or apparent dysplasia/malignancy.  Donaciano Eva, MD

## 2015-02-28 NOTE — Patient Instructions (Signed)
Continue use of premarin cream.  Please call for any questions or concerns.  You may have increased spotting from your examination today.

## 2015-03-03 ENCOUNTER — Ambulatory Visit: Payer: Medicare Other | Admitting: Physician Assistant

## 2015-03-03 ENCOUNTER — Other Ambulatory Visit: Payer: Medicare Other

## 2015-03-04 ENCOUNTER — Telehealth: Payer: Self-pay | Admitting: Physician Assistant

## 2015-03-04 ENCOUNTER — Ambulatory Visit (HOSPITAL_BASED_OUTPATIENT_CLINIC_OR_DEPARTMENT_OTHER): Payer: Medicare Other | Admitting: Physician Assistant

## 2015-03-04 ENCOUNTER — Other Ambulatory Visit (HOSPITAL_BASED_OUTPATIENT_CLINIC_OR_DEPARTMENT_OTHER): Payer: Medicare Other

## 2015-03-04 VITALS — BP 122/54 | HR 100 | Temp 98.2°F | Resp 18 | Ht 66.0 in | Wt 212.2 lb

## 2015-03-04 DIAGNOSIS — C9000 Multiple myeloma not having achieved remission: Secondary | ICD-10-CM

## 2015-03-04 LAB — COMPREHENSIVE METABOLIC PANEL (CC13)
ALK PHOS: 62 U/L (ref 40–150)
ALT: 27 U/L (ref 0–55)
ANION GAP: 13 meq/L — AB (ref 3–11)
AST: 23 U/L (ref 5–34)
Albumin: 3.1 g/dL — ABNORMAL LOW (ref 3.5–5.0)
BILIRUBIN TOTAL: 1.15 mg/dL (ref 0.20–1.20)
BUN: 8.6 mg/dL (ref 7.0–26.0)
CO2: 25 mEq/L (ref 22–29)
CREATININE: 0.9 mg/dL (ref 0.6–1.1)
Calcium: 9.3 mg/dL (ref 8.4–10.4)
Chloride: 104 mEq/L (ref 98–109)
EGFR: 82 mL/min/{1.73_m2} — ABNORMAL LOW (ref >90)
GLUCOSE: 149 mg/dL — AB (ref 70–140)
POTASSIUM: 3.5 meq/L (ref 3.5–5.1)
Sodium: 142 mEq/L (ref 136–145)
TOTAL PROTEIN: 7 g/dL (ref 6.4–8.3)

## 2015-03-04 LAB — CBC WITH DIFFERENTIAL/PLATELET
BASO%: 0.6 % (ref 0.0–2.0)
Basophils Absolute: 0 10*3/uL (ref 0.0–0.1)
EOS%: 2.7 % (ref 0.0–7.0)
Eosinophils Absolute: 0.2 10*3/uL (ref 0.0–0.5)
HCT: 32.3 % — ABNORMAL LOW (ref 34.8–46.6)
HGB: 9.9 g/dL — ABNORMAL LOW (ref 11.6–15.9)
LYMPH#: 0.7 10*3/uL — AB (ref 0.9–3.3)
LYMPH%: 9.5 % — ABNORMAL LOW (ref 14.0–49.7)
MCH: 29.6 pg (ref 25.1–34.0)
MCHC: 30.7 g/dL — ABNORMAL LOW (ref 31.5–36.0)
MCV: 96.4 fL (ref 79.5–101.0)
MONO#: 1 10*3/uL — AB (ref 0.1–0.9)
MONO%: 14.6 % — ABNORMAL HIGH (ref 0.0–14.0)
NEUT#: 5.2 10*3/uL (ref 1.5–6.5)
NEUT%: 72.6 % (ref 38.4–76.8)
Platelets: 158 10*3/uL (ref 145–400)
RBC: 3.35 10*6/uL — ABNORMAL LOW (ref 3.70–5.45)
RDW: 16.8 % — ABNORMAL HIGH (ref 11.2–14.5)
WBC: 7.1 10*3/uL (ref 3.9–10.3)
nRBC: 3 % — ABNORMAL HIGH (ref 0–0)

## 2015-03-04 LAB — LACTATE DEHYDROGENASE (CC13): LDH: 162 U/L (ref 125–245)

## 2015-03-04 LAB — TECHNOLOGIST REVIEW

## 2015-03-04 NOTE — Telephone Encounter (Signed)
Gave avs & calendar for April. °

## 2015-03-06 NOTE — Progress Notes (Signed)
[facility] [phone]   [phone] OFFICE PROGRESS NOTE  Darlene Nakayama, MD [address], [address] [place] [address]  DIAGNOSIS: Stage IIA IgA kappa multiple myeloma diagnosed in 2006   PRIOR THERAPY:  1) status post treatment with thalidomide and Decadron followed by autologous peripheral blood stem cell transplant with high-dose melphalan on 04/26/2006 at [facility].  2) the patient had evidence for disease recurrence in August of 2008 and she was treated with 6 cycles of Velcade completed on 03/30/2008 with response to the treatment but was discontinued secondary to neuropathy.  3) maintenance treatment with Revlimid 10 mg by mouth daily by the does was later increase to 25 mg by mouth daily secondary to biochemical recurrence with stabilization of her disease.   CURRENT THERAPY:  1) Revlimid 15 mg by mouth daily for 2 weeks every 3 weeks in addition to Decadron currently at 20 mg by mouth on a weekly basis.  2) Zometa 4 mg IV every 3 months. last dose on 02/02/2015  ADVANCED DIRECTIVE: She does not have advanced directive and was given information.  INTERVAL HISTORY: Darlene Howard 64 y.o. female returns to the clinic today for routine monthly followup visit. She is feeling fine today with no specific complaints. She is tolerating her treatment with Revlimid and Decadron fairly well with no significant adverse effects. She had a dental clearance from her dentist recently to resume treatment with Zometa. She received Zometa on 02/02/2015. Further review of her medication list revealed that the patient is still taking weekly Fosamax. She does report that she has not taken it this week as of yet. The patient denied having any fever or chills. She denied having any chest pain, shortness of breath, cough or hemoptysis. She has no weight loss or night sweats.   MEDICAL HISTORY: Past Medical History  Diagnosis Date  . Glaucoma    uses eye drops as instructed  . Personal history of colon cancer     ASCENDING COLON--  S/P  RIGHT HEMICOLECTOMY WITH NEGATIVE NODES/    NO RECURRENCE  . Herpes     takes Valtrex as needed  . GERD (gastroesophageal reflux disease)     takes Pantoprazole daily  . Hypertension     takes Amlodipine and Diovan daily  . Bruises easily     d/t being on COumadin  . History of colon polyps   . Type 2 diabetes mellitus   . Neuropathy associated with multiple myeloma     POST CHEMOTHERAPY AND STEM CELL TRANSPLANT  . H/O stem cell transplant     04-26-2006  AT [facility]  . Multiple myeloma DX  2006  STATE IIA,  IgA  KAPPA    STABLE  PER DR MOHAMED--  S/P STEM CELL TRANSPLANT 2007 /  RECURRENCE 2008  CHEMO ENDED 03-30-2008/   NOW ON MAINTANANCE REVLIMID )  . Vocal cord nodule     ASSESSED BY DR TEOH--  LMA  OK W/ PROCEDURE'S  . DDD (degenerative disc disease), lumbar   . Cholelithiasis   . Fatty liver     ALLERGIES:  is allergic to codeine.  MEDICATIONS:  Current Outpatient Prescriptions  Medication Sig Dispense Refill  . alendronate (FOSAMAX) 70 MG tablet Take 1 tablet by mouth  weekly 109mn before  breakfast with large glass  of water on an empty  stomach , remain upright 12 tablet 1  . amLODipine (NORVASC) 2.5 MG tablet Take 1 tablet  by mouth  daily in the morning 90 tablet 1  . aspirin (ASPIRIN LOW DOSE) 81 MG EC tablet Take 81 mg by mouth daily.      . bimatoprost (LUMIGAN) 0.03 % ophthalmic drops Place 1 drop into both eyes at bedtime.     . calcium-vitamin D (OSCAL 500/200 D-3) 500-200 MG-UNIT per tablet Take 1 tablet by mouth 3 (three) times daily.      . cholecalciferol (VITAMIN D) 1000 UNITS tablet Take 1,000 Units by mouth daily.    Marland Kitchen conjugated estrogens (PREMARIN) vaginal cream Place 1 Applicatorful vaginally 2 (two) times a week. 42.5 g 12  . dexamethasone (DECADRON) 4 MG tablet Take 5 tablets by mouth on  a weekly basis as directed 60 tablet 1  . dorzolamide-timolol (COSOPT)  22.3-6.8 MG/ML ophthalmic solution Place 1 drop into both eyes 2 (two) times daily.    Marland Kitchen gabapentin (NEURONTIN) 100 MG capsule Take 1 capsule (100 mg total) by mouth 3 (three) times daily. 270 capsule 1  . lenalidomide (REVLIMID) 15 MG capsule Take 1 capsule (15 mg total) by mouth daily. Take one capsule daily for 14 days.  Then 7 days off. 14 capsule 0  . loratadine (CLARITIN) 10 MG tablet TAKE ONE TABLET BY MOUTH ONCE DAILY. 30 tablet 2  . metFORMIN (GLUCOPHAGE) 1000 MG tablet Take 1 tablet by mouth  daily with breakfast 90 tablet 1  . Multiple Vitamins-Minerals (CENTRUM SILVER PO) Take 1 tablet by mouth daily.     . ONE TOUCH ULTRA TEST test strip USE AS DIRECTED TO CHECK DAILY. 50 each 3  . pantoprazole (PROTONIX) 40 MG tablet Take 1 tablet by mouth  every day for acid reflux. 90 tablet 1  . potassium chloride SA (K-DUR,KLOR-CON) 20 MEQ tablet Take 1 tablet by mouth  twice a day 180 tablet 1  . valACYclovir (VALTREX) 1000 MG tablet Take 1 tablet by mouth  daily as needed 90 tablet 1  . valsartan-hydrochlorothiazide (DIOVAN-HCT) 320-25 MG per tablet Take 1 tablet by mouth  daily 90 tablet 1  . warfarin (COUMADIN) 2 MG tablet Take 1 tablet by mouth  daily 90 tablet 1   No current facility-administered medications for this visit.    SURGICAL HISTORY:  Past Surgical History  Procedure Laterality Date  . Colonoscopy  10/07/2006    [id number] rectum/Status post right hemicolectomy. Normal residual colon  . Cervical cone biopsy    . Cataract extraction w/phaco Left 01/20/2014    Procedure: CATARACT EXTRACTION PHACO AND INTRAOCULAR LENS PLACEMENT (IOC);  Surgeon: Adonis Brook, MD;  Location: [facility];  Service: Ophthalmology;  Laterality: Left;  . 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 Co2  ablation vulva perianal and vaginal dysplatic areas  02-15-2010  . Abdominal hysterectomy  1983    w/  appendectomy and left salpingoophorectom  . Hemicolectomy Right 04-27-2005    CARCINOMA    ASCENDING COLON  . Vulvectomy N/A 05/06/2014    Procedure: WIDE LOCAL EXCISION LEFT LABIA MAJORA;  Surgeon: Janie Morning, MD;  Location: [facility];  Service: Gynecology;  Laterality: N/A;  . Vulvar lesion removal N/A 05/06/2014    Procedure: LASER OF VULVAR LESION;  Surgeon: Janie Morning, MD;  [facility]  Service: Gynecology;  Laterality: N/A;  . Lesion removal N/A 05/06/2014    Procedure: LASER OF THE VAGINA;  Surgeon: Janie Morning, MD;  Location: [facility];  Service: Gynecology;  Laterality: N/A;  . Esophagogastroduodenoscopy  04/02/2005    IWP:[id number] polyp with central erosion versus ulceration in the body of the stomach, resected and recovered Remainder of the gastric mucosa, D1, D2 appeared normal  . Colonoscopy  10/2009    RMR: diminutive RS polyp (hyperplastic).   . Colonoscopy N/A 12/15/2014    Procedure: COLONOSCOPY;  Surgeon: Daneil Dolin, MD;  [facility];  Service: Endoscopy;  Laterality: N/A;  130     REVIEW OF SYSTEMS:  Constitutional: negative Eyes: negative Ears, nose, mouth, throat, and face: negative Respiratory: negative Cardiovascular: negative Gastrointestinal: negative Genitourinary:negative Integument/breast: negative Hematologic/lymphatic: negative Musculoskeletal:positive for Left shoulder pain Neurological: negative Behavioral/Psych: negative Endocrine: negative Allergic/Immunologic: negative   PHYSICAL EXAMINATION: General appearance: alert, cooperative and no distress Head: Normocephalic, without obvious abnormality, atraumatic Neck: no adenopathy Lymph nodes: Cervical, supraclavicular, and axillary nodes normal. Resp: clear to auscultation bilaterally Back: symmetric, no curvature. ROM normal. No CVA tenderness. Cardio: regular rate and rhythm, S1, S2 normal, no murmur, click, rub or gallop GI: soft, non-tender; bowel sounds normal; no masses,  no  organomegaly Extremities: extremities normal, atraumatic, no cyanosis or edema Neurologic: Alert and oriented X 3, normal strength and tone. Normal symmetric reflexes. Normal coordination and gait  ECOG PERFORMANCE STATUS: 1 - Symptomatic but completely ambulatory  Blood pressure 122/54, pulse 100, temperature 98.2 F (36.8 C), temperature source Oral, resp. rate 18, height '5\' 6"'  (1.676 m), weight 212 lb 3.2 oz (96.253 kg), SpO2 100 %.  LABORATORY DATA: Lab Results  Component Value Date   WBC 7.1 03/04/2015   HGB 9.9* 03/04/2015   HCT 32.3* 03/04/2015   MCV 96.4 03/04/2015   PLT 158 03/04/2015      Chemistry      Component Value Date/Time   NA 142 03/04/2015 1334   NA 140 10/27/2014 1038   K 3.5 03/04/2015 1334   K 3.9 10/27/2014 1038   CL 103 10/27/2014 1038   CL 105 06/17/2013 1441   CO2 25 03/04/2015 1334   CO2 27 10/27/2014 1038   BUN 8.6 03/04/2015 1334   BUN 13 10/27/2014 1038   CREATININE 0.9 03/04/2015 1334   CREATININE 0.73 10/27/2014 1038   CREATININE 0.81 01/14/2014 1413      Component Value Date/Time   CALCIUM 9.3 03/04/2015 1334   CALCIUM 9.6 10/27/2014 1038   ALKPHOS 62 03/04/2015 1334   ALKPHOS 62 10/27/2014 1038   AST 23 03/04/2015 1334   AST 33 10/27/2014 1038   ALT 27 03/04/2015 1334   ALT 34 10/27/2014 1038   BILITOT 1.15 03/04/2015 1334   BILITOT 1.4* 10/27/2014 1038     Other lab results: Beta-2 microglobulin 3.08, free kappa Light chain 1.01, free lambda light chain 0.29 with a kappa/lambda ratio 3.48. IgG 274, IgA 1770 and IgM 11.  RADIOGRAPHIC STUDIES: No results found.  ASSESSMENT AND PLAN: This is a very pleasant 64 years old [place] American female with history of multiple myeloma currently on treatment with Revlimid and low-dose Decadron and tolerating her treatment fairly well. The patient is doing fine today with no specific complaints.  She will continue her current treatment with Revlimid and Decadron. She resumed her treatment  with Zometa on 02/02/2015 after we received a dental clearance from her dentist. As stated above further review of the patient's medication list revealed that she was still taking Fosamax weekly. I reviewed this with our oncology pharmacists and have asked her to discontinue the weekly Fosamax. Patient voiced understanding and will discontinue as of today.Marland Kitchen She will  come back for follow-up visit in one month with repeat protein studies to reevaluate her disease. We will have the labs drawn one week prior to her follow up visit so that the results are available for review and discussion,   She was advised to call immediately if she has any concerning symptoms and interval. The patient voices understanding of current disease status and treatment options and is in agreement with the current care plan.  All questions were answered. The patient knows to call the clinic with any problems, questions or concerns. We can certainly see the patient much sooner if necessary.  Carlton Adam, PA-C   Disclaimer: This note was dictated with voice recognition software. Similar sounding words can inadvertently be transcribed and may not be corrected upon review.

## 2015-03-07 ENCOUNTER — Telehealth: Payer: Self-pay | Admitting: Internal Medicine

## 2015-03-07 DIAGNOSIS — I10 Essential (primary) hypertension: Secondary | ICD-10-CM | POA: Diagnosis not present

## 2015-03-07 DIAGNOSIS — E119 Type 2 diabetes mellitus without complications: Secondary | ICD-10-CM | POA: Diagnosis not present

## 2015-03-07 DIAGNOSIS — Z79899 Other long term (current) drug therapy: Secondary | ICD-10-CM | POA: Diagnosis not present

## 2015-03-07 NOTE — Telephone Encounter (Signed)
Left message to confirm lab appointment for 03/30. mailed calendar.

## 2015-03-07 NOTE — Patient Instructions (Signed)
STOP taking your weekly Fosamax! Continue Revlimid and Decadron as prescribed Follow up in one month with labs one week before your visit

## 2015-03-08 LAB — COMPLETE METABOLIC PANEL WITH GFR
ALBUMIN: 3.4 g/dL — AB (ref 3.5–5.2)
ALT: 19 U/L (ref 0–35)
AST: 14 U/L (ref 0–37)
Alkaline Phosphatase: 51 U/L (ref 39–117)
BILIRUBIN TOTAL: 1 mg/dL (ref 0.2–1.2)
BUN: 15 mg/dL (ref 6–23)
CO2: 27 meq/L (ref 19–32)
Calcium: 8.3 mg/dL — ABNORMAL LOW (ref 8.4–10.5)
Chloride: 103 mEq/L (ref 96–112)
Creat: 0.84 mg/dL (ref 0.50–1.10)
GFR, Est African American: 84 mL/min
GFR, Est Non African American: 73 mL/min
Glucose, Bld: 116 mg/dL — ABNORMAL HIGH (ref 70–99)
Potassium: 3.3 mEq/L — ABNORMAL LOW (ref 3.5–5.3)
Sodium: 139 mEq/L (ref 135–145)
Total Protein: 6.5 g/dL (ref 6.0–8.3)

## 2015-03-08 LAB — LIPID PANEL
CHOLESTEROL: 123 mg/dL (ref 0–200)
HDL: 45 mg/dL — AB (ref >=46)
LDL Cholesterol: 56 mg/dL (ref 0–99)
Total CHOL/HDL Ratio: 2.7 Ratio
Triglycerides: 108 mg/dL (ref <150)
VLDL: 22 mg/dL (ref 0–40)

## 2015-03-08 LAB — HEMOGLOBIN A1C
Hgb A1c MFr Bld: 6.6 % — ABNORMAL HIGH (ref <5.7)
Mean Plasma Glucose: 143 mg/dL — ABNORMAL HIGH (ref <117)

## 2015-03-09 ENCOUNTER — Encounter: Payer: Self-pay | Admitting: Family Medicine

## 2015-03-09 ENCOUNTER — Ambulatory Visit (INDEPENDENT_AMBULATORY_CARE_PROVIDER_SITE_OTHER): Payer: Medicare Other | Admitting: Family Medicine

## 2015-03-09 VITALS — BP 126/74 | HR 83 | Resp 16 | Ht 66.0 in | Wt 212.0 lb

## 2015-03-09 DIAGNOSIS — N3001 Acute cystitis with hematuria: Secondary | ICD-10-CM | POA: Insufficient documentation

## 2015-03-09 DIAGNOSIS — I10 Essential (primary) hypertension: Secondary | ICD-10-CM

## 2015-03-09 DIAGNOSIS — E119 Type 2 diabetes mellitus without complications: Secondary | ICD-10-CM

## 2015-03-09 DIAGNOSIS — E559 Vitamin D deficiency, unspecified: Secondary | ICD-10-CM

## 2015-03-09 DIAGNOSIS — Z0000 Encounter for general adult medical examination without abnormal findings: Secondary | ICD-10-CM | POA: Diagnosis not present

## 2015-03-09 DIAGNOSIS — N3000 Acute cystitis without hematuria: Secondary | ICD-10-CM | POA: Diagnosis not present

## 2015-03-09 DIAGNOSIS — Z1159 Encounter for screening for other viral diseases: Secondary | ICD-10-CM

## 2015-03-09 DIAGNOSIS — Z23 Encounter for immunization: Secondary | ICD-10-CM | POA: Diagnosis not present

## 2015-03-09 LAB — POCT URINALYSIS DIPSTICK
Bilirubin, UA: NEGATIVE
Glucose, UA: NEGATIVE
Ketones, UA: NEGATIVE
Nitrite, UA: NEGATIVE
Protein, UA: NEGATIVE
Spec Grav, UA: 1.01
UROBILINOGEN UA: 0.2
pH, UA: 5.5

## 2015-03-09 MED ORDER — CIPROFLOXACIN HCL 500 MG PO TABS: 500.0000 mg | ORAL_TABLET | Freq: Two times a day (BID) | ORAL | Status: DC

## 2015-03-09 NOTE — Patient Instructions (Signed)
F/u with foot exam in 4 month, call if you need me before  Prevnar today  Urine suggests infection, ciprofloxacin prescribed for 3 days  EKG is entirely normal, no sign of heart damage or enlargement which is excellent You will get info re potassium rich foods, also, paper on carb counting  Pls commit to more regular, exercise and we are aiming for 2.5 pond weight loss each month  Microalb, HBA1C, non fasting cmp and EGFR and vit D in 4 months and TSH  Pls work on living will

## 2015-03-09 NOTE — Assessment & Plan Note (Signed)
Vaccine administered at visit.  

## 2015-03-09 NOTE — Progress Notes (Signed)
Subjective:    Patient ID: Darlene Howard, female    DOB: 05/26/1951, 64 y.o.   MRN: [id number]  HPI Preventive Screening-Counseling & Management   Patient present here today for a welcome to  Medicare  wellness visit.   Current Problems (verified)   Medications Prior to Visit Allergies (verified)   PAST HISTORY  Family History (verified)   Social History Never been married, no children and retired from [employer]  Current exercise habits: very little exercise. Do take care of her disabled sister  , also significant arthritis in spine  Dietary issues discussed: Heart healthy diet, eats mainly fruits and vegetables and limits fried fatty foods    Cardiac risk factors: diabetes, HTN ,obesity and hyperlipidemia  Depression Screen  (Note: if answer to either of the following is "Yes", a more complete depression screening is indicated)   Over the past two weeks, have you felt down, depressed or hopeless? No  Over the past two weeks, have you felt little interest or pleasure in doing things? No  Have you lost interest or pleasure in daily life? No  Do you often feel hopeless? No  Do you cry easily over simple problems? No   Activities of Daily Living  In your present state of health, do you have any difficulty performing the following activities?  Driving?: No Managing money?: No Feeding yourself?:No Getting from bed to chair?:moves slowly Climbing a flight of stairs?:yes, back pain and disc disease Preparing food and eating?:No Bathing or showering?:yes at times Getting dressed?: yes, left shoulder is  frozen Getting to the toilet?:No Using the toilet?:No Moving around from place to place?: No  Fall Risk Assessment In the past year have you fallen or had a near fall?:No Are you currently taking any medications that make you dizzy?:No   Hearing Difficulties: No Do you often ask people to speak up or repeat themselves?:No Do you experience ringing  or noises in your ears?:No Do you have difficulty understanding soft or whispered voices?:No  Cognitive Testing  Alert? Yes Normal Appearance?Yes  Oriented to person? Yes Place? Yes  Time? Yes  Displays appropriate judgment?Yes  Can read the correct time from a watch face? yes Are you having problems remembering things?No  Advanced Directives have been discussed with the patient?Yes, brochure given , pt is a full code and intends to work on a living will   List the Names of Other Physician/Practitioners you currently use:  Dr Merwyn Katos (oncologist)  Dr Skeet Latch (ob)  Dr Lanell Matar (opthlamology)   Indicate any recent Medical Services you may have received from other than [facility] providers in the past year (date may be approximate).   Assessment:    Welcome to medicare exam  Plan:    .  Medicare Attestation  I have personally reviewed:  The patient's medical and social history  Their use of alcohol, tobacco or illicit drugs  Their current medications and supplements  The patient's functional ability including ADLs,fall risks, home safety risks, cognitive, and hearing and visual impairment  Diet and physical activities  Evidence for depression or mood disorders  The patient's weight, height, BMI, and visual acuity have been recorded in the chart. I have made referrals, counseling, and provided education to the patient based on review of the above and I have provided the patient with a written personalized care plan for preventive services.      Review of Systems     Objective:   Physical Exam  BP 126/74 mmHg  Pulse 83  Resp 16  Ht 5\' 6"  (1.676 m)  Wt 212 lb (96.163 kg)  BMI 34.23 kg/m2  SpO2 99%  EKG: normal sinus rhythm, no ischemic changes, no LVH  Urinalysis : due to longstanding hypertension and diabetes, suggestive of infection, specimen sent for culture, pt treated with 2 3 day course of ciprofloxacin also     Assessment & Plan:  Welcome to Medicare  preventive visit Annual exam as documented. Counseling done  re healthy lifestyle involving commitment to 150 minutes exercise per week, heart healthy diet, and attaining healthy weight.The importance of adequate sleep also discussed. Regular seat belt use and home safety, is also discussed. Changes in health habits are decided on by the patient with goals and time frames  set for achieving them. Immunization and cancer screening needs are specifically addressed at this visit.  EKG done due to increase CV risk from diabetes an hypertension and hyperlipidemia    Need for vaccination with 13-polyvalent pneumococcal conjugate vaccine Vaccine administered at visit.

## 2015-03-09 NOTE — Assessment & Plan Note (Addendum)
Annual exam as documented. Counseling done  re healthy lifestyle involving commitment to 150 minutes exercise per week, heart healthy diet, and attaining healthy weight.The importance of adequate sleep also discussed. Regular seat belt use and home safety, is also discussed. Changes in health habits are decided on by the patient with goals and time frames  set for achieving them. Immunization and cancer screening needs are specifically addressed at this visit.  EKG done due to increase CV risk from diabetes an hypertension and hyperlipidemia

## 2015-03-11 LAB — URINE CULTURE: Colony Count: 7000

## 2015-03-14 ENCOUNTER — Telehealth: Payer: Self-pay | Admitting: Family Medicine

## 2015-03-14 NOTE — Addendum Note (Signed)
Addended by: Eual Fines on: 03/14/2015 02:33 PM   Modules accepted: Orders

## 2015-03-14 NOTE — Telephone Encounter (Signed)
Spoke with her, she is aware of result

## 2015-03-14 NOTE — Addendum Note (Signed)
Addended by: Eual Fines on: 03/14/2015 02:04 PM   Modules accepted: Orders

## 2015-03-16 DIAGNOSIS — E1165 Type 2 diabetes mellitus with hyperglycemia: Secondary | ICD-10-CM | POA: Diagnosis not present

## 2015-03-16 DIAGNOSIS — B351 Tinea unguium: Secondary | ICD-10-CM | POA: Diagnosis not present

## 2015-03-16 DIAGNOSIS — L609 Nail disorder, unspecified: Secondary | ICD-10-CM | POA: Diagnosis not present

## 2015-03-23 ENCOUNTER — Other Ambulatory Visit: Payer: Self-pay | Admitting: Medical Oncology

## 2015-03-23 DIAGNOSIS — C9000 Multiple myeloma not having achieved remission: Secondary | ICD-10-CM

## 2015-03-23 MED ORDER — LENALIDOMIDE 15 MG PO CAPS: 15.0000 mg | ORAL_CAPSULE | Freq: Every day | ORAL | Status: DC

## 2015-03-24 DIAGNOSIS — H25041 Posterior subcapsular polar age-related cataract, right eye: Secondary | ICD-10-CM | POA: Diagnosis not present

## 2015-03-24 DIAGNOSIS — H4011X2 Primary open-angle glaucoma, moderate stage: Secondary | ICD-10-CM | POA: Diagnosis not present

## 2015-03-30 ENCOUNTER — Other Ambulatory Visit (HOSPITAL_BASED_OUTPATIENT_CLINIC_OR_DEPARTMENT_OTHER): Payer: Medicare Other

## 2015-03-30 DIAGNOSIS — C9000 Multiple myeloma not having achieved remission: Secondary | ICD-10-CM | POA: Diagnosis not present

## 2015-03-30 LAB — COMPREHENSIVE METABOLIC PANEL (CC13)
ALK PHOS: 57 U/L (ref 40–150)
ALT: 23 U/L (ref 0–55)
AST: 18 U/L (ref 5–34)
Albumin: 3 g/dL — ABNORMAL LOW (ref 3.5–5.0)
Anion Gap: 12 mEq/L — ABNORMAL HIGH (ref 3–11)
BILIRUBIN TOTAL: 1.18 mg/dL (ref 0.20–1.20)
BUN: 8.2 mg/dL (ref 7.0–26.0)
CHLORIDE: 107 meq/L (ref 98–109)
CO2: 23 mEq/L (ref 22–29)
Calcium: 8.6 mg/dL (ref 8.4–10.4)
Creatinine: 0.8 mg/dL (ref 0.6–1.1)
EGFR: 84 mL/min/{1.73_m2} — AB (ref >90)
Glucose: 114 mg/dl (ref 70–140)
Potassium: 3.6 mEq/L (ref 3.5–5.1)
Sodium: 142 mEq/L (ref 136–145)
TOTAL PROTEIN: 7 g/dL (ref 6.4–8.3)

## 2015-03-30 LAB — CBC WITH DIFFERENTIAL/PLATELET
BASO%: 0.6 % (ref 0.0–2.0)
BASOS ABS: 0.1 10*3/uL (ref 0.0–0.1)
EOS ABS: 0.2 10*3/uL (ref 0.0–0.5)
EOS%: 1.9 % (ref 0.0–7.0)
HCT: 32.2 % — ABNORMAL LOW (ref 34.8–46.6)
HGB: 10 g/dL — ABNORMAL LOW (ref 11.6–15.9)
LYMPH%: 12.4 % — ABNORMAL LOW (ref 14.0–49.7)
MCH: 29.7 pg (ref 25.1–34.0)
MCHC: 31.1 g/dL — ABNORMAL LOW (ref 31.5–36.0)
MCV: 95.5 fL (ref 79.5–101.0)
MONO#: 0.9 10*3/uL (ref 0.1–0.9)
MONO%: 10.4 % (ref 0.0–14.0)
NEUT#: 6.5 10*3/uL (ref 1.5–6.5)
NEUT%: 74.7 % (ref 38.4–76.8)
PLATELETS: 130 10*3/uL — AB (ref 145–400)
RBC: 3.37 10*6/uL — ABNORMAL LOW (ref 3.70–5.45)
RDW: 16.9 % — ABNORMAL HIGH (ref 11.2–14.5)
WBC: 8.7 10*3/uL (ref 3.9–10.3)
lymph#: 1.1 10*3/uL (ref 0.9–3.3)
nRBC: 2 % — ABNORMAL HIGH (ref 0–0)

## 2015-03-30 LAB — LACTATE DEHYDROGENASE (CC13): LDH: 184 U/L (ref 125–245)

## 2015-04-01 LAB — KAPPA/LAMBDA LIGHT CHAINS
KAPPA LAMBDA RATIO: 1.74 — AB (ref 0.26–1.65)
Kappa free light chain: 1.57 mg/dL (ref 0.33–1.94)
LAMBDA FREE LGHT CHN: 0.9 mg/dL (ref 0.57–2.63)

## 2015-04-01 LAB — IGG, IGA, IGM
IGM, SERUM: 17 mg/dL — AB (ref 52–322)
IgA: 1960 mg/dL — ABNORMAL HIGH (ref 69–380)
IgG (Immunoglobin G), Serum: 320 mg/dL — ABNORMAL LOW (ref 690–1700)

## 2015-04-01 LAB — BETA 2 MICROGLOBULIN, SERUM: Beta-2 Microglobulin: 2.88 mg/L — ABNORMAL HIGH (ref <=2.51)

## 2015-04-06 ENCOUNTER — Encounter: Payer: Self-pay | Admitting: Internal Medicine

## 2015-04-06 ENCOUNTER — Other Ambulatory Visit: Payer: Medicare Other

## 2015-04-06 ENCOUNTER — Telehealth: Payer: Self-pay | Admitting: Internal Medicine

## 2015-04-06 ENCOUNTER — Ambulatory Visit (HOSPITAL_BASED_OUTPATIENT_CLINIC_OR_DEPARTMENT_OTHER): Payer: Medicare Other | Admitting: Internal Medicine

## 2015-04-06 VITALS — BP 147/64 | HR 94 | Temp 98.4°F | Resp 18 | Ht 66.0 in | Wt 207.4 lb

## 2015-04-06 DIAGNOSIS — C9000 Multiple myeloma not having achieved remission: Secondary | ICD-10-CM | POA: Diagnosis not present

## 2015-04-06 NOTE — Telephone Encounter (Signed)
Appointments made and avs has been printed for patient   anne

## 2015-04-06 NOTE — Progress Notes (Signed)
[place] [phone]   [phone] OFFICE PROGRESS NOTE  [name], MD [address], [address] [place] [address]  DIAGNOSIS: Stage IIA IgA kappa multiple myeloma diagnosed in 2006   PRIOR THERAPY:  1) status post treatment with thalidomide and Decadron followed by autologous peripheral blood stem cell transplant with high-dose melphalan on 04/26/2006 at [facility].  2) the patient had evidence for disease recurrence in August of 2008 and she was treated with 6 cycles of Velcade completed on 03/30/2008 with response to the treatment but was discontinued secondary to neuropathy.  3) maintenance treatment with Revlimid 10 mg by mouth daily by the does was later increase to 15 mg by mouth daily secondary to biochemical recurrence with stabilization of her disease.   CURRENT THERAPY:  1) Revlimid 15 mg by mouth daily for 2 weeks every 3 weeks in addition to Decadron currently at 20 mg by mouth on a weekly basis.  2) Zometa 4 mg IV every 3 months. Next dose in 05/05/2015  ADVANCED DIRECTIVE: She does not have advanced directive and was given information.  INTERVAL HISTORY: Darlene Howard 64 y.o. female returns to the clinic today for routine monthly followup visit. She is feeling fine today with no specific complaints except for mild fatigue. She is tolerating her treatment with Revlimid and Decadron fairly well with no significant adverse effects. The patient denied having any fever or chills. She denied having any chest pain, shortness of breath, cough or hemoptysis. She has no weight loss or night sweats. She had repeat myeloma panel and she is here today for evaluation and discussion of her lab results.  MEDICAL HISTORY: Past Medical History  Diagnosis Date  . Glaucoma     uses eye drops as instructed  . Personal history of colon cancer     ASCENDING COLON--  S/P  RIGHT HEMICOLECTOMY WITH NEGATIVE NODES/    NO RECURRENCE  .  Herpes     takes Valtrex as needed  . GERD (gastroesophageal reflux disease)     takes Pantoprazole daily  . Hypertension     takes Amlodipine and Diovan daily  . Bruises easily     d/t being on COumadin  . History of colon polyps   . Type 2 diabetes mellitus   . Neuropathy associated with multiple myeloma     POST CHEMOTHERAPY AND STEM CELL TRANSPLANT  . H/O stem cell transplant     04-26-2006  AT [facility]  . Multiple myeloma DX  2006  STATE IIA,  IgA  KAPPA    STABLE  PER DR MOHAMED--  S/P STEM CELL TRANSPLANT 2007 /  RECURRENCE 2008  CHEMO ENDED 03-30-2008/   NOW ON MAINTANANCE REVLIMID )  . Vocal cord nodule     ASSESSED BY DR TEOH--  LMA  OK W/ PROCEDURE'S  . DDD (degenerative disc disease), lumbar   . Cholelithiasis   . Fatty liver     ALLERGIES:  is allergic to codeine.  MEDICATIONS:  Current Outpatient Prescriptions  Medication Sig Dispense Refill  . amLODipine (NORVASC) 2.5 MG tablet Take 1 tablet by mouth  daily in the morning 90 tablet 1  . aspirin (ASPIRIN LOW DOSE) 81 MG EC tablet Take 81 mg by mouth daily.      . bimatoprost (LUMIGAN) 0.03 % ophthalmic drops Place 1 drop into both eyes at bedtime.     . calcium-vitamin D (OSCAL 500/200 D-3) 500-200 MG-UNIT per tablet  Take 1 tablet by mouth 3 (three) times daily.      . cholecalciferol (VITAMIN D) 1000 UNITS tablet Take 1,000 Units by mouth daily.    . ciprofloxacin (CIPRO) 500 MG tablet Take 1 tablet (500 mg total) by mouth 2 (two) times daily. 6 tablet 0  . conjugated estrogens (PREMARIN) vaginal cream Place 1 Applicatorful vaginally 2 (two) times a week. 42.5 g 12  . dexamethasone (DECADRON) 4 MG tablet Take 5 tablets by mouth on  a weekly basis as directed 60 tablet 1  . dorzolamide-timolol (COSOPT) 22.3-6.8 MG/ML ophthalmic solution Place 1 drop into both eyes 2 (two) times daily.    Marland Kitchen gabapentin (NEURONTIN) 100 MG capsule Take 1 capsule (100 mg total) by mouth 3 (three) times daily. 270 capsule 1  . lenalidomide  (REVLIMID) 15 MG capsule Take 1 capsule (15 mg total) by mouth daily. Take one capsule daily for 14 days.  Then 7 days off. 14 capsule 0  . loratadine (CLARITIN) 10 MG tablet TAKE ONE TABLET BY MOUTH ONCE DAILY. 30 tablet 2  . metFORMIN (GLUCOPHAGE) 1000 MG tablet Take 1 tablet by mouth  daily with breakfast 90 tablet 1  . Multiple Vitamins-Minerals (CENTRUM SILVER PO) Take 1 tablet by mouth daily.     . ONE TOUCH ULTRA TEST test strip USE AS DIRECTED TO CHECK DAILY. 50 each 3  . pantoprazole (PROTONIX) 40 MG tablet Take 1 tablet by mouth  every day for acid reflux. 90 tablet 1  . potassium chloride SA (K-DUR,KLOR-CON) 20 MEQ tablet Take 1 tablet by mouth  twice a day 180 tablet 1  . valACYclovir (VALTREX) 1000 MG tablet Take 1 tablet by mouth  daily as needed 90 tablet 1  . valsartan-hydrochlorothiazide (DIOVAN-HCT) 320-25 MG per tablet Take 1 tablet by mouth  daily 90 tablet 1  . warfarin (COUMADIN) 2 MG tablet Take 1 tablet by mouth  daily 90 tablet 1   No current facility-administered medications for this visit.    SURGICAL HISTORY:  Past Surgical History  Procedure Laterality Date  . Colonoscopy  10/07/2006    [id number] rectum/Status post right hemicolectomy. Normal residual colon  . Cervical cone biopsy    . Cataract extraction w/phaco Left 01/20/2014    Procedure: CATARACT EXTRACTION PHACO AND INTRAOCULAR LENS PLACEMENT (IOC);  Surgeon: Adonis Brook, MD;  Location: [facility];  Service: Ophthalmology;  Laterality: Left;  . 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 Co2  ablation vulva perianal and vaginal dysplatic areas  02-15-2010  . Abdominal hysterectomy  1983    w/  appendectomy and left salpingoophorectom  . Hemicolectomy Right 04-27-2005    CARCINOMA   ASCENDING COLON  . Vulvectomy N/A 05/06/2014    Procedure: WIDE LOCAL EXCISION LEFT LABIA MAJORA;  Surgeon: Janie Morning, MD;  Location: [facility];  Service: Gynecology;  Laterality: N/A;  . Vulvar  lesion removal N/A 05/06/2014    Procedure: LASER OF VULVAR LESION;  Surgeon: Janie Morning, MD;  Location: [facility];  Service: Gynecology;  Laterality: N/A;  . Lesion removal N/A 05/06/2014    Procedure: LASER OF THE VAGINA;  Surgeon: Janie Morning, MD;  Location: [facility];  Service: Gynecology;  Laterality: N/A;  . Esophagogastroduodenoscopy  04/02/2005    [id number] polyp with central erosion versus ulceration in the body of the stomach, resected and recovered Remainder of the gastric mucosa, D1, D2 appeared normal  . Colonoscopy  10/2009  RMR: diminutive RS polyp (hyperplastic).   . Colonoscopy N/A 12/15/2014    Procedure: COLONOSCOPY;  Surgeon: Daneil Dolin, MD;  [facility];  Service: Endoscopy;  Laterality: N/A;  130     REVIEW OF SYSTEMS:  Constitutional: positive for fatigue Eyes: negative Ears, nose, mouth, throat, and face: negative Respiratory: negative Cardiovascular: negative Gastrointestinal: negative Genitourinary:negative Integument/breast: negative Hematologic/lymphatic: negative Musculoskeletal:negative Neurological: negative Behavioral/Psych: negative Endocrine: negative Allergic/Immunologic: negative   PHYSICAL EXAMINATION: General appearance: alert, cooperative and no distress Head: Normocephalic, without obvious abnormality, atraumatic Neck: no adenopathy Lymph nodes: Cervical, supraclavicular, and axillary nodes normal. Resp: clear to auscultation bilaterally Back: symmetric, no curvature. ROM normal. No CVA tenderness. Cardio: regular rate and rhythm, S1, S2 normal, no murmur, click, rub or gallop GI: soft, non-tender; bowel sounds normal; no masses,  no organomegaly Extremities: extremities normal, atraumatic, no cyanosis or edema Neurologic: Alert and oriented X 3, normal strength and tone. Normal symmetric reflexes. Normal coordination and gait  ECOG PERFORMANCE STATUS: 1 - Symptomatic  but completely ambulatory  There were no vitals taken for this visit.  LABORATORY DATA: Lab Results  Component Value Date   WBC 8.7 03/30/2015   HGB 10.0* 03/30/2015   HCT 32.2* 03/30/2015   MCV 95.5 03/30/2015   PLT 130* 03/30/2015      Chemistry      Component Value Date/Time   NA 142 03/30/2015 1338   NA 139 03/07/2015 1203   K 3.6 03/30/2015 1338   K 3.3* 03/07/2015 1203   CL 103 03/07/2015 1203   CL 105 06/17/2013 1441   CO2 23 03/30/2015 1338   CO2 27 03/07/2015 1203   BUN 8.2 03/30/2015 1338   BUN 15 03/07/2015 1203   CREATININE 0.8 03/30/2015 1338   CREATININE 0.84 03/07/2015 1203   CREATININE 0.81 01/14/2014 1413      Component Value Date/Time   CALCIUM 8.6 03/30/2015 1338   CALCIUM 8.3* 03/07/2015 1203   ALKPHOS 57 03/30/2015 1338   ALKPHOS 51 03/07/2015 1203   AST 18 03/30/2015 1338   AST 14 03/07/2015 1203   ALT 23 03/30/2015 1338   ALT 19 03/07/2015 1203   BILITOT 1.18 03/30/2015 1338   BILITOT 1.0 03/07/2015 1203     Other lab results: Beta-2 microglobulin 2.88, free kappa Light chain 1.57, free lambda light chain 0.90 with a kappa/lambda ratio 1.74. IgG 320, IgA 1960 and IgM 17.  RADIOGRAPHIC STUDIES: No results found.  ASSESSMENT AND PLAN: This is a very pleasant 64 years old [place] American female with history of multiple myeloma currently on treatment with Revlimid and low-dose Decadron and tolerating her treatment fairly well. The patient is doing fine today with no specific complaints except for mild fatigue.  Her myeloma panel is unremarkable except for continuous increase in the level of IgA which is currently 1960. I discussed the lab result with the patient today. I recommended for her to continue her treatment with Revlimid and Decadron but I would increase your dose of Revlimid to 25 mg by mouth daily for 21 days every 4 weeks starting next month after she completed her current bottle of medication. The patient will continue her  treatment with Zometa every 3 months as a scheduled. She will come back for follow-up visit in 3 weeks for reevaluation before starting the higher dose of Revlimid. She was advised to call immediately if she has any concerning symptoms and interval. The patient voices understanding of current disease status and treatment options and is  in agreement with the current care plan.  All questions were answered. The patient knows to call the clinic with any problems, questions or concerns. We can certainly see the patient much sooner if necessary.  Disclaimer: This note was dictated with voice recognition software. Similar sounding words can inadvertently be transcribed and may not be corrected upon review.

## 2015-04-11 ENCOUNTER — Encounter: Payer: Self-pay | Admitting: Family Medicine

## 2015-04-11 ENCOUNTER — Other Ambulatory Visit: Payer: Self-pay | Admitting: Medical Oncology

## 2015-04-11 ENCOUNTER — Telehealth: Payer: Self-pay

## 2015-04-11 DIAGNOSIS — H25811 Combined forms of age-related cataract, right eye: Secondary | ICD-10-CM | POA: Diagnosis not present

## 2015-04-11 DIAGNOSIS — H269 Unspecified cataract: Secondary | ICD-10-CM | POA: Diagnosis not present

## 2015-04-11 DIAGNOSIS — H4011X2 Primary open-angle glaucoma, moderate stage: Secondary | ICD-10-CM | POA: Diagnosis not present

## 2015-04-11 DIAGNOSIS — C9000 Multiple myeloma not having achieved remission: Secondary | ICD-10-CM

## 2015-04-11 MED ORDER — LENALIDOMIDE 25 MG PO CAPS: 25.0000 mg | ORAL_CAPSULE | Freq: Every day | ORAL | Status: DC

## 2015-04-11 NOTE — Telephone Encounter (Signed)
revlimid refill request given to RN

## 2015-04-21 DIAGNOSIS — I10 Essential (primary) hypertension: Secondary | ICD-10-CM | POA: Diagnosis not present

## 2015-04-21 DIAGNOSIS — C9000 Multiple myeloma not having achieved remission: Secondary | ICD-10-CM | POA: Diagnosis not present

## 2015-04-21 DIAGNOSIS — Z9484 Stem cells transplant status: Secondary | ICD-10-CM | POA: Diagnosis not present

## 2015-04-26 ENCOUNTER — Encounter: Payer: Self-pay | Admitting: Internal Medicine

## 2015-04-26 ENCOUNTER — Ambulatory Visit (HOSPITAL_BASED_OUTPATIENT_CLINIC_OR_DEPARTMENT_OTHER): Payer: Medicare Other | Admitting: Internal Medicine

## 2015-04-26 ENCOUNTER — Other Ambulatory Visit (HOSPITAL_BASED_OUTPATIENT_CLINIC_OR_DEPARTMENT_OTHER): Payer: Medicare Other

## 2015-04-26 ENCOUNTER — Telehealth: Payer: Self-pay | Admitting: Internal Medicine

## 2015-04-26 VITALS — BP 123/63 | HR 81 | Temp 97.9°F | Resp 18 | Ht 66.0 in | Wt 205.8 lb

## 2015-04-26 DIAGNOSIS — C9000 Multiple myeloma not having achieved remission: Secondary | ICD-10-CM

## 2015-04-26 DIAGNOSIS — Z9484 Stem cells transplant status: Secondary | ICD-10-CM | POA: Diagnosis not present

## 2015-04-26 DIAGNOSIS — R5383 Other fatigue: Secondary | ICD-10-CM

## 2015-04-26 LAB — CBC WITH DIFFERENTIAL/PLATELET
BASO%: 0.7 % (ref 0.0–2.0)
Basophils Absolute: 0 10*3/uL (ref 0.0–0.1)
EOS ABS: 0.1 10*3/uL (ref 0.0–0.5)
EOS%: 1.2 % (ref 0.0–7.0)
HCT: 30.5 % — ABNORMAL LOW (ref 34.8–46.6)
HEMOGLOBIN: 9.7 g/dL — AB (ref 11.6–15.9)
LYMPH%: 19.9 % (ref 14.0–49.7)
MCH: 30.6 pg (ref 25.1–34.0)
MCHC: 31.8 g/dL (ref 31.5–36.0)
MCV: 96.2 fL (ref 79.5–101.0)
MONO#: 0.7 10*3/uL (ref 0.1–0.9)
MONO%: 13 % (ref 0.0–14.0)
NEUT#: 3.7 10*3/uL (ref 1.5–6.5)
NEUT%: 65.2 % (ref 38.4–76.8)
Platelets: 107 10*3/uL — ABNORMAL LOW (ref 145–400)
RBC: 3.17 10*6/uL — ABNORMAL LOW (ref 3.70–5.45)
RDW: 17.2 % — ABNORMAL HIGH (ref 11.2–14.5)
WBC: 5.6 10*3/uL (ref 3.9–10.3)
lymph#: 1.1 10*3/uL (ref 0.9–3.3)

## 2015-04-26 LAB — COMPREHENSIVE METABOLIC PANEL (CC13)
ALT: 19 U/L (ref 0–55)
AST: 15 U/L (ref 5–34)
Albumin: 3.1 g/dL — ABNORMAL LOW (ref 3.5–5.0)
Alkaline Phosphatase: 64 U/L (ref 40–150)
Anion Gap: 11 mEq/L (ref 3–11)
BILIRUBIN TOTAL: 1.33 mg/dL — AB (ref 0.20–1.20)
BUN: 12.5 mg/dL (ref 7.0–26.0)
CALCIUM: 9.3 mg/dL (ref 8.4–10.4)
CHLORIDE: 109 meq/L (ref 98–109)
CO2: 24 meq/L (ref 22–29)
Creatinine: 0.8 mg/dL (ref 0.6–1.1)
EGFR: 84 mL/min/{1.73_m2} — ABNORMAL LOW (ref >90)
GLUCOSE: 131 mg/dL (ref 70–140)
Potassium: 4.2 mEq/L (ref 3.5–5.1)
Sodium: 143 mEq/L (ref 136–145)
Total Protein: 6.8 g/dL (ref 6.4–8.3)

## 2015-04-26 LAB — LACTATE DEHYDROGENASE (CC13): LDH: 152 U/L (ref 125–245)

## 2015-04-26 MED ORDER — LENALIDOMIDE 25 MG PO CAPS: 25.0000 mg | ORAL_CAPSULE | Freq: Every day | ORAL | Status: DC

## 2015-04-26 NOTE — Progress Notes (Signed)
[place] Telephone:(336) [phone]   Fax:(336) [phone] OFFICE PROGRESS NOTE  [name], MD [address] [place] [address]  DIAGNOSIS: Stage IIA IgA kappa multiple myeloma diagnosed in 2006   PRIOR THERAPY:  1) status post treatment with thalidomide and Decadron followed by autologous peripheral blood stem cell transplant with high-dose melphalan on 04/26/2006 at [facility].  2) the patient had evidence for disease recurrence in August of 2008 and she was treated with 6 cycles of Velcade completed on 03/30/2008 with response to the treatment but was discontinued secondary to neuropathy.  3) maintenance treatment with Revlimid 10 mg by mouth daily by the does was later increase to 15 mg by mouth daily secondary to biochemical recurrence with stabilization of her disease. The treatment with Revlimid 15 mg by mouth daily was discontinued secondary to evidence for disease progression.  CURRENT THERAPY:  1) Revlimid 25 mg by mouth daily for 3 weeks every 4 weeks in addition to Decadron currently at 20 mg by mouth on a weekly basis. This is expected to be started in the next few days. 2) Zometa 4 mg IV every 3 months. Next dose in 05/05/2015  ADVANCED DIRECTIVE: She does not have advanced directive and was given information.  INTERVAL HISTORY: Darlene Howard 64 y.o. female returns to the clinic today for routine monthly followup visit. She is feeling fine today with no specific complaints except for mild fatigue. She is tolerating her treatment with Revlimid and Decadron fairly well with no significant adverse effects. She discontinued her treatment with Revlimid 15 mg by mouth daily 2 weeks ago. The patient denied having any fever or chills. She denied having any chest pain, shortness of breath, cough or hemoptysis. She has no weight loss or night sweats. She is here today for evaluation before starting her treatment with Revlimid 25 mg by mouth  daily.  MEDICAL HISTORY: Past Medical History  Diagnosis Date  . Glaucoma     uses eye drops as instructed  . Personal history of colon cancer     ASCENDING COLON--  S/P  RIGHT HEMICOLECTOMY WITH NEGATIVE NODES/    NO RECURRENCE  . Herpes     takes Valtrex as needed  . GERD (gastroesophageal reflux disease)     takes Pantoprazole daily  . Hypertension     takes Amlodipine and Diovan daily  . Bruises easily     d/t being on COumadin  . History of colon polyps   . Type 2 diabetes mellitus   . Neuropathy associated with multiple myeloma     POST CHEMOTHERAPY AND STEM CELL TRANSPLANT  . H/O stem cell transplant     04-26-2006  AT [facility]  . Multiple myeloma DX  2006  STATE IIA,  IgA  KAPPA    STABLE  PER DR Ameriah Lint--  S/P STEM CELL TRANSPLANT 2007 /  RECURRENCE 2008  CHEMO ENDED 03-30-2008/   NOW ON MAINTANANCE REVLIMID )  . Vocal cord nodule     ASSESSED BY DR TEOH--  LMA  OK W/ PROCEDURE'S  . DDD (degenerative disc disease), lumbar   . Cholelithiasis   . Fatty liver     ALLERGIES:  is allergic to codeine.  MEDICATIONS:  Current Outpatient Prescriptions  Medication Sig Dispense Refill  . amLODipine (NORVASC) 2.5 MG tablet Take 1 tablet by mouth  daily in the morning 90 tablet 1  . aspirin (ASPIRIN LOW DOSE) 81 MG EC tablet Take  81 mg by mouth daily.      . bimatoprost (LUMIGAN) 0.03 % ophthalmic drops Place 1 drop into both eyes at bedtime.     . calcium-vitamin D (OSCAL 500/200 D-3) 500-200 MG-UNIT per tablet Take 1 tablet by mouth 3 (three) times daily.      . cholecalciferol (VITAMIN D) 1000 UNITS tablet Take 1,000 Units by mouth daily.    Marland Kitchen conjugated estrogens (PREMARIN) vaginal cream Place 1 Applicatorful vaginally 2 (two) times a week. 42.5 g 12  . dorzolamide-timolol (COSOPT) 22.3-6.8 MG/ML ophthalmic solution Place 1 drop into both eyes 2 (two) times daily.    . DUREZOL 0.05 % EMUL     . gabapentin (NEURONTIN) 100 MG capsule Take 1 capsule (100 mg total) by mouth 3  (three) times daily. 270 capsule 1  . ILEVRO 0.3 % ophthalmic suspension     . lenalidomide (REVLIMID) 25 MG capsule Take 1 capsule (25 mg total) by mouth daily. take 25 mg every day for 21 days every 4 weeks 21 capsule 0  . loratadine (CLARITIN) 10 MG tablet TAKE ONE TABLET BY MOUTH ONCE DAILY. 30 tablet 2  . metFORMIN (GLUCOPHAGE) 1000 MG tablet Take 1 tablet by mouth  daily with breakfast 90 tablet 1  . Multiple Vitamins-Minerals (CENTRUM SILVER PO) Take 1 tablet by mouth daily.     . ONE TOUCH ULTRA TEST test strip USE AS DIRECTED TO CHECK DAILY. 50 each 3  . pantoprazole (PROTONIX) 40 MG tablet Take 1 tablet by mouth  every day for acid reflux. 90 tablet 1  . potassium chloride SA (K-DUR,KLOR-CON) 20 MEQ tablet Take 1 tablet by mouth  twice a day 180 tablet 1  . valACYclovir (VALTREX) 1000 MG tablet Take 1 tablet by mouth  daily as needed 90 tablet 1  . valsartan-hydrochlorothiazide (DIOVAN-HCT) 320-25 MG per tablet Take 1 tablet by mouth  daily 90 tablet 1  . warfarin (COUMADIN) 2 MG tablet Take 1 tablet by mouth  daily 90 tablet 1  . dexamethasone (DECADRON) 4 MG tablet Take 5 tablets by mouth on  a weekly basis as directed (Patient not taking: Reported on 04/26/2015) 60 tablet 1  . REVLIMID 15 MG capsule Take 15 mg by mouth daily. Take for 14 days     No current facility-administered medications for this visit.    SURGICAL HISTORY:  Past Surgical History  Procedure Laterality Date  . Colonoscopy  10/07/2006    [id number] rectum/Status post right hemicolectomy. Normal residual colon  . Cervical cone biopsy    . Cataract extraction w/phaco Left 01/20/2014    Procedure: CATARACT EXTRACTION PHACO AND INTRAOCULAR LENS PLACEMENT (IOC);  Surgeon: Adonis Brook, MD;  Location: [facility];  Service: Ophthalmology;  Laterality: Left;  . 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 Co2  ablation vulva perianal and vaginal dysplatic areas  02-15-2010  . Abdominal hysterectomy  1983    w/   appendectomy and left salpingoophorectom  . Hemicolectomy Right 04-27-2005    CARCINOMA   ASCENDING COLON  . Vulvectomy N/A 05/06/2014    Procedure: WIDE LOCAL EXCISION LEFT LABIA MAJORA;  Surgeon: Janie Morning, MD;  Location: [facility];  Service: Gynecology;  Laterality: N/A;  . Vulvar lesion removal N/A 05/06/2014    Procedure: LASER OF VULVAR LESION;  Surgeon: Janie Morning, MD;  Location: [facility];  Service: Gynecology;  Laterality: N/A;  . Lesion removal N/A 05/06/2014    Procedure: LASER OF THE  VAGINA;  Surgeon: Janie Morning, MD;  Location: [facility];  Service: Gynecology;  Laterality: N/A;  . Esophagogastroduodenoscopy  04/02/2005    [id number] polyp with central erosion versus ulceration in the body of the stomach, resected and recovered Remainder of the gastric mucosa, D1, D2 appeared normal  . Colonoscopy  10/2009    RMR: diminutive RS polyp (hyperplastic).   . Colonoscopy N/A 12/15/2014    Procedure: COLONOSCOPY;  Surgeon: Daneil Dolin, MD;  [facility];  Service: Endoscopy;  Laterality: N/A;  130     REVIEW OF SYSTEMS:  Constitutional: positive for fatigue Eyes: negative Ears, nose, mouth, throat, and face: negative Respiratory: negative Cardiovascular: negative Gastrointestinal: negative Genitourinary:negative Integument/breast: negative Hematologic/lymphatic: negative Musculoskeletal:negative Neurological: negative Behavioral/Psych: negative Endocrine: negative Allergic/Immunologic: negative   PHYSICAL EXAMINATION: General appearance: alert, cooperative and no distress Head: Normocephalic, without obvious abnormality, atraumatic Neck: no adenopathy Lymph nodes: Cervical, supraclavicular, and axillary nodes normal. Resp: clear to auscultation bilaterally Back: symmetric, no curvature. ROM normal. No CVA tenderness. Cardio: regular rate and rhythm, S1, S2 normal, no murmur, click, rub or  gallop GI: soft, non-tender; bowel sounds normal; no masses,  no organomegaly Extremities: extremities normal, atraumatic, no cyanosis or edema Neurologic: Alert and oriented X 3, normal strength and tone. Normal symmetric reflexes. Normal coordination and gait  ECOG PERFORMANCE STATUS: 1 - Symptomatic but completely ambulatory  There were no vitals taken for this visit.  LABORATORY DATA: Lab Results  Component Value Date   WBC 5.6 04/26/2015   HGB 9.7* 04/26/2015   HCT 30.5* 04/26/2015   MCV 96.2 04/26/2015   PLT 107* 04/26/2015      Chemistry      Component Value Date/Time   NA 143 04/26/2015 1040   NA 139 03/07/2015 1203   K 4.2 04/26/2015 1040   K 3.3* 03/07/2015 1203   CL 103 03/07/2015 1203   CL 105 06/17/2013 1441   CO2 24 04/26/2015 1040   CO2 27 03/07/2015 1203   BUN 12.5 04/26/2015 1040   BUN 15 03/07/2015 1203   CREATININE 0.8 04/26/2015 1040   CREATININE 0.84 03/07/2015 1203   CREATININE 0.81 01/14/2014 1413      Component Value Date/Time   CALCIUM 9.3 04/26/2015 1040   CALCIUM 8.3* 03/07/2015 1203   ALKPHOS 64 04/26/2015 1040   ALKPHOS 51 03/07/2015 1203   AST 15 04/26/2015 1040   AST 14 03/07/2015 1203   ALT 19 04/26/2015 1040   ALT 19 03/07/2015 1203   BILITOT 1.33* 04/26/2015 1040   BILITOT 1.0 03/07/2015 1203     Other lab results: Beta-2 microglobulin 2.88, free kappa Light chain 1.57, free lambda light chain 0.90 with a kappa/lambda ratio 1.74. IgG 320, IgA 1960 and IgM 17.  RADIOGRAPHIC STUDIES: No results found.  ASSESSMENT AND PLAN: This is a very pleasant 64 years old [place] American female with history of multiple myeloma currently on treatment with Revlimid and low-dose Decadron and tolerating her treatment fairly well. The patient is doing fine today with no specific complaints except for mild fatigue.  Because of the recent disease progression, I recommended for her to continue her treatment with Revlimid and Decadron but I would  increase the dose of Revlimid to 25 mg by mouth daily for 21 days every 4 weeks starting next month after she completed her current bottle of medication. She is expected to start the first dose of this treatment in the next few days once she receives the medication. The  patient will continue her treatment with Zometa every 3 months as a scheduled. She will come back for follow-up visit in 4 weeks for reevaluation. She was advised to call immediately if she has any concerning symptoms and interval. The patient voices understanding of current disease status and treatment options and is in agreement with the current care plan.  All questions were answered. The patient knows to call the clinic with any problems, questions or concerns. We can certainly see the patient much sooner if necessary.  Disclaimer: This note was dictated with voice recognition software. Similar sounding words can inadvertently be transcribed and may not be corrected upon review.

## 2015-04-26 NOTE — Telephone Encounter (Signed)
Pt confirmed labs/ov per 04/26 POF, gave pt AVS and Calendar...  KJ

## 2015-04-29 ENCOUNTER — Other Ambulatory Visit: Payer: Self-pay | Admitting: Family Medicine

## 2015-05-04 ENCOUNTER — Ambulatory Visit (HOSPITAL_BASED_OUTPATIENT_CLINIC_OR_DEPARTMENT_OTHER): Payer: Medicare Other

## 2015-05-04 VITALS — BP 114/49 | HR 80 | Temp 98.2°F | Resp 17

## 2015-05-04 DIAGNOSIS — C9000 Multiple myeloma not having achieved remission: Secondary | ICD-10-CM

## 2015-05-04 MED ORDER — ZOLEDRONIC ACID 4 MG/5ML IV CONC
4.0000 mg | Freq: Once | INTRAVENOUS | Status: AC
  Administered 2015-05-04: 4 mg via INTRAVENOUS
  Filled 2015-05-04: qty 5

## 2015-05-04 MED ORDER — SODIUM CHLORIDE 0.9 % IV SOLN
250.0000 mL | Freq: Once | INTRAVENOUS | Status: AC
  Administered 2015-05-04: 14:00:00 via INTRAVENOUS

## 2015-05-04 NOTE — Patient Instructions (Signed)

## 2015-05-16 ENCOUNTER — Telehealth: Payer: Self-pay

## 2015-05-16 NOTE — Telephone Encounter (Signed)
I instructed pt to take '25mg'$  daily for 21 days

## 2015-05-16 NOTE — Telephone Encounter (Signed)
Pt said dr [name] told her he was increasing the dosage to 25 mg. He did not say anything about increasing the number of days to the patient. This needs clarification to the pt.

## 2015-05-20 ENCOUNTER — Other Ambulatory Visit: Payer: Self-pay | Admitting: *Deleted

## 2015-05-20 DIAGNOSIS — C9000 Multiple myeloma not having achieved remission: Secondary | ICD-10-CM

## 2015-05-20 MED ORDER — LENALIDOMIDE 25 MG PO CAPS: 25.0000 mg | ORAL_CAPSULE | Freq: Every day | ORAL | Status: DC

## 2015-05-20 NOTE — Telephone Encounter (Signed)
Revlimid Survey completed, escribed refill for pt

## 2015-05-24 DIAGNOSIS — H4011X2 Primary open-angle glaucoma, moderate stage: Secondary | ICD-10-CM | POA: Diagnosis not present

## 2015-05-26 ENCOUNTER — Other Ambulatory Visit (HOSPITAL_BASED_OUTPATIENT_CLINIC_OR_DEPARTMENT_OTHER): Payer: Medicare Other

## 2015-05-26 ENCOUNTER — Ambulatory Visit (HOSPITAL_BASED_OUTPATIENT_CLINIC_OR_DEPARTMENT_OTHER): Payer: Medicare Other | Admitting: Physician Assistant

## 2015-05-26 ENCOUNTER — Telehealth: Payer: Self-pay | Admitting: Internal Medicine

## 2015-05-26 ENCOUNTER — Encounter: Payer: Self-pay | Admitting: Physician Assistant

## 2015-05-26 VITALS — BP 141/70 | HR 91 | Temp 98.1°F | Resp 19 | Ht 66.0 in | Wt 204.1 lb

## 2015-05-26 DIAGNOSIS — C9000 Multiple myeloma not having achieved remission: Secondary | ICD-10-CM

## 2015-05-26 LAB — CBC WITH DIFFERENTIAL/PLATELET
BASO%: 1.4 % (ref 0.0–2.0)
BASOS ABS: 0.1 10*3/uL (ref 0.0–0.1)
EOS ABS: 0.1 10*3/uL (ref 0.0–0.5)
EOS%: 1 % (ref 0.0–7.0)
HEMATOCRIT: 29.7 % — AB (ref 34.8–46.6)
HEMOGLOBIN: 9.4 g/dL — AB (ref 11.6–15.9)
LYMPH%: 17.7 % (ref 14.0–49.7)
MCH: 30.8 pg (ref 25.1–34.0)
MCHC: 31.6 g/dL (ref 31.5–36.0)
MCV: 97.4 fL (ref 79.5–101.0)
MONO#: 1.2 10*3/uL — ABNORMAL HIGH (ref 0.1–0.9)
MONO%: 17.3 % — AB (ref 0.0–14.0)
NEUT#: 4.4 10*3/uL (ref 1.5–6.5)
NEUT%: 62.6 % (ref 38.4–76.8)
Platelets: 107 10*3/uL — ABNORMAL LOW (ref 145–400)
RBC: 3.05 10*6/uL — ABNORMAL LOW (ref 3.70–5.45)
RDW: 17.5 % — ABNORMAL HIGH (ref 11.2–14.5)
WBC: 7 10*3/uL (ref 3.9–10.3)
lymph#: 1.2 10*3/uL (ref 0.9–3.3)

## 2015-05-26 LAB — COMPREHENSIVE METABOLIC PANEL (CC13)
ALT: 20 U/L (ref 0–55)
AST: 17 U/L (ref 5–34)
Albumin: 3.1 g/dL — ABNORMAL LOW (ref 3.5–5.0)
Alkaline Phosphatase: 62 U/L (ref 40–150)
Anion Gap: 9 mEq/L (ref 3–11)
BILIRUBIN TOTAL: 1.06 mg/dL (ref 0.20–1.20)
BUN: 6.7 mg/dL — ABNORMAL LOW (ref 7.0–26.0)
CALCIUM: 8.9 mg/dL (ref 8.4–10.4)
CO2: 26 mEq/L (ref 22–29)
Chloride: 108 mEq/L (ref 98–109)
Creatinine: 0.8 mg/dL (ref 0.6–1.1)
Glucose: 109 mg/dl (ref 70–140)
POTASSIUM: 3.5 meq/L (ref 3.5–5.1)
Sodium: 143 mEq/L (ref 136–145)
Total Protein: 7.1 g/dL (ref 6.4–8.3)

## 2015-05-26 LAB — LACTATE DEHYDROGENASE (CC13): LDH: 181 U/L (ref 125–245)

## 2015-05-26 NOTE — Progress Notes (Signed)
[place] [phone]   [phone] OFFICE PROGRESS NOTE  [name], MD [address] [address]  DIAGNOSIS: Stage IIA IgA kappa multiple myeloma diagnosed in 2006   PRIOR THERAPY:  1) status post treatment with thalidomide and Decadron followed by autologous peripheral blood stem cell transplant with high-dose melphalan on 04/26/2006 at [facility].  2) the patient had evidence for disease recurrence in August of 2008 and she was treated with 6 cycles of Velcade completed on 03/30/2008 with response to the treatment but was discontinued secondary to neuropathy.  3) maintenance treatment with Revlimid 10 mg by mouth daily by the does was later increase to 15 mg by mouth daily secondary to biochemical recurrence with stabilization of her disease. The treatment with Revlimid 15 mg by mouth daily was discontinued secondary to evidence for disease progression.  CURRENT THERAPY:  1) Revlimid 25 mg by mouth daily for 3 weeks every 4 weeks in addition to Decadron currently at 20 mg by mouth on a weekly basis. Status post 1 cycle. 2) Zometa 4 mg IV every 3 months. Next dose in 08/04/2015  ADVANCED DIRECTIVE: She does not have advanced directive and was given information.  INTERVAL HISTORY: Darlene Howard 64 y.o. female returns to the clinic today for routine monthly followup visit. She is feeling fine today with no specific complaints except for mild fatigue. She is tolerating her treatment with Revlimid and Decadron fairly well with no significant adverse effects except for a few days of malaise. She Started her current cycle with Revlimid 25 mg on 05/25/2015. The patient denied having any fever or chills. She denied having any chest pain, shortness of breath, cough or hemoptysis. She has no weight loss or night sweats.   MEDICAL HISTORY: Past Medical History  Diagnosis Date  . Glaucoma     uses eye drops as instructed    . Personal history of colon cancer     ASCENDING COLON--  S/P  RIGHT HEMICOLECTOMY WITH NEGATIVE NODES/    NO RECURRENCE  . Herpes     takes Valtrex as needed  . GERD (gastroesophageal reflux disease)     takes Pantoprazole daily  . Hypertension     takes Amlodipine and Diovan daily  . Bruises easily     d/t being on COumadin  . History of colon polyps   . Type 2 diabetes mellitus   . Neuropathy associated with multiple myeloma     POST CHEMOTHERAPY AND STEM CELL TRANSPLANT  . H/O stem cell transplant     04-26-2006  AT [facility]  . Multiple myeloma DX  2006  STATE IIA,  IgA  KAPPA    STABLE  PER DR MOHAMED--  S/P STEM CELL TRANSPLANT 2007 /  RECURRENCE 2008  CHEMO ENDED 03-30-2008/   NOW ON MAINTANANCE REVLIMID )  . Vocal cord nodule     ASSESSED BY DR TEOH--  LMA  OK W/ PROCEDURE'S  . DDD (degenerative disc disease), lumbar   . Cholelithiasis   . Fatty liver     ALLERGIES:  is allergic to codeine.  MEDICATIONS:  Current Outpatient Prescriptions  Medication Sig Dispense Refill  . amLODipine (NORVASC) 2.5 MG tablet Take 1 tablet by mouth  daily in the morning 90 tablet 1  . aspirin (ASPIRIN LOW DOSE) 81 MG EC tablet Take 81 mg by mouth daily.      . bimatoprost (LUMIGAN) 0.03 % ophthalmic drops Place  1 drop into both eyes at bedtime.     . calcium-vitamin D (OSCAL 500/200 D-3) 500-200 MG-UNIT per tablet Take 1 tablet by mouth 3 (three) times daily.      . cholecalciferol (VITAMIN D) 1000 UNITS tablet Take 1,000 Units by mouth daily.    Marland Kitchen conjugated estrogens (PREMARIN) vaginal cream Place 1 Applicatorful vaginally 2 (two) times a week. 42.5 g 12  . dexamethasone (DECADRON) 4 MG tablet Take 5 tablets by mouth on  a weekly basis as directed 60 tablet 1  . dorzolamide-timolol (COSOPT) 22.3-6.8 MG/ML ophthalmic solution Place 1 drop into both eyes 2 (two) times daily.    . DUREZOL 0.05 % EMUL     . gabapentin (NEURONTIN) 100 MG capsule Take 1 capsule (100 mg total) by mouth 3 (three)  times daily. 270 capsule 1  . ILEVRO 0.3 % ophthalmic suspension     . lenalidomide (REVLIMID) 25 MG capsule Take 1 capsule (25 mg total) by mouth daily. take 25 mg every day for 14 days then off 1 week . 21 capsule 0  . loratadine (CLARITIN) 10 MG tablet TAKE ONE TABLET BY MOUTH ONCE DAILY. 30 tablet 3  . metFORMIN (GLUCOPHAGE) 1000 MG tablet Take 1 tablet by mouth  daily with breakfast 90 tablet 1  . Multiple Vitamins-Minerals (CENTRUM SILVER PO) Take 1 tablet by mouth daily.     . ONE TOUCH ULTRA TEST test strip USE AS DIRECTED TO CHECK DAILY. 50 each 3  . pantoprazole (PROTONIX) 40 MG tablet Take 1 tablet by mouth  every day for acid reflux. 90 tablet 1  . potassium chloride SA (K-DUR,KLOR-CON) 20 MEQ tablet Take 1 tablet by mouth  twice a day 180 tablet 1  . valACYclovir (VALTREX) 1000 MG tablet Take 1 tablet by mouth  daily as needed 90 tablet 1  . valsartan-hydrochlorothiazide (DIOVAN-HCT) 320-25 MG per tablet Take 1 tablet by mouth  daily 90 tablet 1  . warfarin (COUMADIN) 2 MG tablet Take 1 tablet by mouth  daily 90 tablet 1   No current facility-administered medications for this visit.    SURGICAL HISTORY:  Past Surgical History  Procedure Laterality Date  . Colonoscopy  10/07/2006    [id number] rectum/Status post right hemicolectomy. Normal residual colon  . Cervical cone biopsy    . Cataract extraction w/phaco Left 01/20/2014    Procedure: CATARACT EXTRACTION PHACO AND INTRAOCULAR LENS PLACEMENT (IOC);  Surgeon: Adonis Brook, MD;  Location: [place];  Service: Ophthalmology;  Laterality: Left;  . 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 Co2  ablation vulva perianal and vaginal dysplatic areas  02-15-2010  . Abdominal hysterectomy  1983    w/  appendectomy and left salpingoophorectom  . Hemicolectomy Right 04-27-2005    CARCINOMA   ASCENDING COLON  . Vulvectomy N/A 05/06/2014    Procedure: WIDE LOCAL EXCISION LEFT LABIA MAJORA;  Surgeon: Janie Morning, MD;  Location:  [facility];  Service: Gynecology;  Laterality: N/A;  . Vulvar lesion removal N/A 05/06/2014    Procedure: LASER OF VULVAR LESION;  Surgeon: Janie Morning, MD;  Location: [facility];  Service: Gynecology;  Laterality: N/A;  . Lesion removal N/A 05/06/2014    Procedure: LASER OF THE VAGINA;  Surgeon: Janie Morning, MD;  Location: [facility];  Service: Gynecology;  Laterality: N/A;  . Esophagogastroduodenoscopy  04/02/2005    [id number] polyp with central erosion versus ulceration in the body of the stomach, resected  and recovered Remainder of the gastric mucosa, D1, D2 appeared normal  . Colonoscopy  10/2009    RMR: diminutive RS polyp (hyperplastic).   . Colonoscopy N/A 12/15/2014    Procedure: COLONOSCOPY;  Surgeon: Daneil Dolin, MD;  [facility];  Service: Endoscopy;  Laterality: N/A;  130     REVIEW OF SYSTEMS:  Constitutional: positive for fatigue and malaise Eyes: negative Ears, nose, mouth, throat, and face: negative Respiratory: negative Cardiovascular: negative Gastrointestinal: negative Genitourinary:negative Integument/breast: negative Hematologic/lymphatic: negative Musculoskeletal:negative Neurological: negative Behavioral/Psych: negative Endocrine: negative Allergic/Immunologic: negative   PHYSICAL EXAMINATION: General appearance: alert, cooperative and no distress Head: Normocephalic, without obvious abnormality, atraumatic Neck: no adenopathy Lymph nodes: Cervical, supraclavicular, and axillary nodes normal. Resp: clear to auscultation bilaterally Back: symmetric, no curvature. ROM normal. No CVA tenderness. Cardio: regular rate and rhythm, S1, S2 normal, no murmur, click, rub or gallop GI: soft, non-tender; bowel sounds normal; no masses,  no organomegaly Extremities: extremities normal, atraumatic, no cyanosis or edema Neurologic: Alert and oriented X 3, normal strength and tone. Normal  symmetric reflexes. Normal coordination and gait  ECOG PERFORMANCE STATUS: 1 - Symptomatic but completely ambulatory  Blood pressure 141/70, pulse 91, temperature 98.1 F (36.7 C), temperature source Oral, resp. rate 19, height '5\' 6"'  (1.676 m), weight 204 lb 1.6 oz (92.579 kg), SpO2 100 %.  LABORATORY DATA: Lab Results  Component Value Date   WBC 7.0 05/26/2015   HGB 9.4* 05/26/2015   HCT 29.7* 05/26/2015   MCV 97.4 05/26/2015   PLT 107* 05/26/2015      Chemistry      Component Value Date/Time   NA 143 04/26/2015 1040   NA 139 03/07/2015 1203   K 4.2 04/26/2015 1040   K 3.3* 03/07/2015 1203   CL 103 03/07/2015 1203   CL 105 06/17/2013 1441   CO2 24 04/26/2015 1040   CO2 27 03/07/2015 1203   BUN 12.5 04/26/2015 1040   BUN 15 03/07/2015 1203   CREATININE 0.8 04/26/2015 1040   CREATININE 0.84 03/07/2015 1203   CREATININE 0.81 01/14/2014 1413      Component Value Date/Time   CALCIUM 9.3 04/26/2015 1040   CALCIUM 8.3* 03/07/2015 1203   ALKPHOS 64 04/26/2015 1040   ALKPHOS 51 03/07/2015 1203   AST 15 04/26/2015 1040   AST 14 03/07/2015 1203   ALT 19 04/26/2015 1040   ALT 19 03/07/2015 1203   BILITOT 1.33* 04/26/2015 1040   BILITOT 1.0 03/07/2015 1203     Other lab results: Beta-2 microglobulin 2.88, free kappa Light chain 1.57, free lambda light chain 0.90 with a kappa/lambda ratio 1.74. IgG 320, IgA 1960 and IgM 17.  RADIOGRAPHIC STUDIES: No results found.  ASSESSMENT AND PLAN: This is a very pleasant 64 years old [place] American female with history of multiple myeloma currently on treatment with Revlimid and low-dose Decadron and tolerating her treatment fairly well. The patient is doing fine today with no specific complaints except for mild fatigue.  Because of the recent disease progression, she is now being treated with Revlimid to 25 mg by mouth daily for 21 days every 4 weeks. She is status post 1 cycle. Overall she tolerated the increased dose of Revlimid well  except for a few days of malaise.  The patient will continue her treatment with Zometa every 3 months as a scheduled. The patient was discussed with and also seen by Dr. [name]. She will come back for follow-up visit in 4 weeks for reevaluation. She  was advised to call immediately if she has any concerning symptoms and interval. The patient voices understanding of current disease status and treatment options and is in agreement with the current care plan.  All questions were answered. The patient knows to call the clinic with any problems, questions or concerns. We can certainly see the patient much sooner if necessary.  Carlton Adam, PA-C 05/26/2015   Disclaimer: This note was dictated with voice recognition software. Similar sounding words can inadvertently be transcribed and may not be corrected upon review.

## 2015-05-26 NOTE — Telephone Encounter (Signed)
Gave and printed appt sched and avs fo rpt for June and Sept

## 2015-05-28 ENCOUNTER — Other Ambulatory Visit: Payer: Self-pay | Admitting: Family Medicine

## 2015-05-28 ENCOUNTER — Other Ambulatory Visit: Payer: Self-pay | Admitting: Internal Medicine

## 2015-05-30 NOTE — Patient Instructions (Signed)
Continue Revlimid and dexamethasone at the current doses Follow up in one month

## 2015-06-08 ENCOUNTER — Telehealth: Payer: Self-pay | Admitting: Medical Oncology

## 2015-06-08 NOTE — Telephone Encounter (Signed)
Per [facility] Lanius to change vendor for warfarin. Called to [facility].

## 2015-06-13 ENCOUNTER — Telehealth: Payer: Self-pay | Admitting: *Deleted

## 2015-06-13 ENCOUNTER — Other Ambulatory Visit: Payer: Self-pay | Admitting: Medical Oncology

## 2015-06-13 DIAGNOSIS — C9000 Multiple myeloma not having achieved remission: Secondary | ICD-10-CM

## 2015-06-13 MED ORDER — LENALIDOMIDE 25 MG PO CAPS: 25.0000 mg | ORAL_CAPSULE | Freq: Every day | ORAL | Status: DC

## 2015-06-13 NOTE — Telephone Encounter (Signed)
[facility] faxed Revlimid refill request.  Request to provider's desk/in-basket for review.

## 2015-06-13 NOTE — Progress Notes (Signed)
faxed revlimid refill to [facility]

## 2015-06-22 DIAGNOSIS — L609 Nail disorder, unspecified: Secondary | ICD-10-CM | POA: Diagnosis not present

## 2015-06-22 DIAGNOSIS — B351 Tinea unguium: Secondary | ICD-10-CM | POA: Diagnosis not present

## 2015-06-22 DIAGNOSIS — E1165 Type 2 diabetes mellitus with hyperglycemia: Secondary | ICD-10-CM | POA: Diagnosis not present

## 2015-06-29 ENCOUNTER — Ambulatory Visit (HOSPITAL_BASED_OUTPATIENT_CLINIC_OR_DEPARTMENT_OTHER): Payer: Medicare Other | Admitting: Internal Medicine

## 2015-06-29 ENCOUNTER — Encounter: Payer: Self-pay | Admitting: Internal Medicine

## 2015-06-29 ENCOUNTER — Other Ambulatory Visit (HOSPITAL_BASED_OUTPATIENT_CLINIC_OR_DEPARTMENT_OTHER): Payer: Medicare Other

## 2015-06-29 ENCOUNTER — Telehealth: Payer: Self-pay | Admitting: Internal Medicine

## 2015-06-29 ENCOUNTER — Telehealth: Payer: Self-pay | Admitting: Medical Oncology

## 2015-06-29 VITALS — BP 126/81 | HR 80 | Temp 98.6°F | Resp 18 | Ht 66.0 in | Wt 201.0 lb

## 2015-06-29 DIAGNOSIS — R5383 Other fatigue: Secondary | ICD-10-CM | POA: Diagnosis not present

## 2015-06-29 DIAGNOSIS — C9000 Multiple myeloma not having achieved remission: Secondary | ICD-10-CM

## 2015-06-29 LAB — CBC WITH DIFFERENTIAL/PLATELET
BASO%: 0.4 % (ref 0.0–2.0)
Basophils Absolute: 0 10*3/uL (ref 0.0–0.1)
EOS%: 2.4 % (ref 0.0–7.0)
Eosinophils Absolute: 0.2 10*3/uL (ref 0.0–0.5)
HCT: 32.8 % — ABNORMAL LOW (ref 34.8–46.6)
HGB: 10.2 g/dL — ABNORMAL LOW (ref 11.6–15.9)
LYMPH#: 0.8 10*3/uL — AB (ref 0.9–3.3)
LYMPH%: 12.1 % — ABNORMAL LOW (ref 14.0–49.7)
MCH: 30.2 pg (ref 25.1–34.0)
MCHC: 31.1 g/dL — ABNORMAL LOW (ref 31.5–36.0)
MCV: 97 fL (ref 79.5–101.0)
MONO#: 0.5 10*3/uL (ref 0.1–0.9)
MONO%: 6.5 % (ref 0.0–14.0)
NEUT%: 78.6 % — ABNORMAL HIGH (ref 38.4–76.8)
NEUTROS ABS: 5.5 10*3/uL (ref 1.5–6.5)
Platelets: 140 10*3/uL — ABNORMAL LOW (ref 145–400)
RBC: 3.38 10*6/uL — AB (ref 3.70–5.45)
RDW: 17.2 % — AB (ref 11.2–14.5)
WBC: 7 10*3/uL (ref 3.9–10.3)

## 2015-06-29 LAB — COMPREHENSIVE METABOLIC PANEL (CC13)
ALT: 35 U/L (ref 0–55)
AST: 31 U/L (ref 5–34)
Albumin: 3.2 g/dL — ABNORMAL LOW (ref 3.5–5.0)
Alkaline Phosphatase: 66 U/L (ref 40–150)
Anion Gap: 11 mEq/L (ref 3–11)
BILIRUBIN TOTAL: 1.84 mg/dL — AB (ref 0.20–1.20)
BUN: 6.5 mg/dL — AB (ref 7.0–26.0)
CALCIUM: 9 mg/dL (ref 8.4–10.4)
CO2: 27 meq/L (ref 22–29)
CREATININE: 0.8 mg/dL (ref 0.6–1.1)
Chloride: 106 mEq/L (ref 98–109)
EGFR: 86 mL/min/{1.73_m2} — ABNORMAL LOW (ref >90)
GLUCOSE: 125 mg/dL (ref 70–140)
Potassium: 3.8 mEq/L (ref 3.5–5.1)
Sodium: 144 mEq/L (ref 136–145)
Total Protein: 6.9 g/dL (ref 6.4–8.3)

## 2015-06-29 LAB — TECHNOLOGIST REVIEW

## 2015-06-29 LAB — LACTATE DEHYDROGENASE (CC13): LDH: 173 U/L (ref 125–245)

## 2015-06-29 NOTE — Telephone Encounter (Signed)
Pt confirmed labs/ov per 06/29 POF, gave pt AVS and Calendar... KJ

## 2015-06-29 NOTE — Progress Notes (Signed)
[place] [phone]   [phone]  OFFICE PROGRESS NOTE  [name], MD [address] [place] [address]  DIAGNOSIS: Stage IIA IgA kappa multiple myeloma diagnosed in 2006   PRIOR THERAPY:  1) status post treatment with thalidomide and Decadron followed by autologous peripheral blood stem cell transplant with high-dose melphalan on 04/26/2006 at [facility].  2) the patient had evidence for disease recurrence in August of 2008 and she was treated with 6 cycles of Velcade completed on 03/30/2008 with response to the treatment but was discontinued secondary to neuropathy.  3) maintenance treatment with Revlimid 10 mg by mouth daily by the does was later increase to 15 mg by mouth daily secondary to biochemical recurrence with stabilization of her disease. The treatment with Revlimid 15 mg by mouth daily was discontinued secondary to evidence for disease progression.  CURRENT THERAPY:  1) Revlimid 25 mg by mouth daily for 3 weeks every 4 weeks in addition to Decadron currently at 20 mg by mouth on a weekly basis. She is status post 2 cycles. 2) Zometa 4 mg IV every 3 months. Next dose in 05/05/2015  ADVANCED DIRECTIVE: She does not have advanced directive and was given information.  INTERVAL HISTORY: Darlene Howard 64 y.o. female returns to the clinic today for routine monthly followup visit. She is feeling fine today with no specific complaints except for mild fatigue. She is tolerating her treatment with Revlimid and Decadron fairly well with no significant adverse effects. The patient denied having any fever or chills. She denied having any chest pain, shortness of breath, cough or hemoptysis. She has no weight loss or night sweats.   MEDICAL HISTORY: Past Medical History  Diagnosis Date  . Glaucoma     uses eye drops as instructed  . Personal history of colon cancer     ASCENDING COLON--  S/P  RIGHT HEMICOLECTOMY  WITH NEGATIVE NODES/    NO RECURRENCE  . Herpes     takes Valtrex as needed  . GERD (gastroesophageal reflux disease)     takes Pantoprazole daily  . Hypertension     takes Amlodipine and Diovan daily  . Bruises easily     d/t being on COumadin  . History of colon polyps   . Type 2 diabetes mellitus   . Neuropathy associated with multiple myeloma     POST CHEMOTHERAPY AND STEM CELL TRANSPLANT  . H/O stem cell transplant     04-26-2006  AT [facility]  . Multiple myeloma DX  2006  STATE IIA,  IgA  KAPPA    STABLE  PER DR Roxana Lai--  S/P STEM CELL TRANSPLANT 2007 /  RECURRENCE 2008  CHEMO ENDED 03-30-2008/   NOW ON MAINTANANCE REVLIMID )  . Vocal cord nodule     ASSESSED BY DR TEOH--  LMA  OK W/ PROCEDURE'S  . DDD (degenerative disc disease), lumbar   . Cholelithiasis   . Fatty liver     ALLERGIES:  is allergic to codeine.  MEDICATIONS:  Current Outpatient Prescriptions  Medication Sig Dispense Refill  . amLODipine (NORVASC) 2.5 MG tablet Take 1 tablet by mouth  daily in the morning 90 tablet 1  . aspirin (ASPIRIN LOW DOSE) 81 MG EC tablet Take 81 mg by mouth daily.      . bimatoprost (LUMIGAN) 0.03 % ophthalmic drops Place 1 drop into both eyes at bedtime.     . calcium-vitamin D (OSCAL 500/200  D-3) 500-200 MG-UNIT per tablet Take 1 tablet by mouth 3 (three) times daily.      . cholecalciferol (VITAMIN D) 1000 UNITS tablet Take 1,000 Units by mouth daily.    Marland Kitchen conjugated estrogens (PREMARIN) vaginal cream Place 1 Applicatorful vaginally 2 (two) times a week. 42.5 g 12  . dexamethasone (DECADRON) 4 MG tablet Take 5 tablets by mouth on  a weekly basis as directed 60 tablet 1  . dorzolamide-timolol (COSOPT) 22.3-6.8 MG/ML ophthalmic solution Place 1 drop into both eyes 2 (two) times daily.    . DUREZOL 0.05 % EMUL     . gabapentin (NEURONTIN) 100 MG capsule Take 1 capsule by mouth 3  times daily 270 capsule 1  . ILEVRO 0.3 % ophthalmic suspension     . lenalidomide (REVLIMID) 25 MG  capsule Take 1 capsule (25 mg total) by mouth daily. take 25 mg every day for 21 days every 4 weeks. 21 capsule 0  . loratadine (CLARITIN) 10 MG tablet TAKE ONE TABLET BY MOUTH ONCE DAILY. 30 tablet 3  . metFORMIN (GLUCOPHAGE) 1000 MG tablet Take 1 tablet by mouth  daily with breakfast 90 tablet 1  . Multiple Vitamins-Minerals (CENTRUM SILVER PO) Take 1 tablet by mouth daily.     . ONE TOUCH ULTRA TEST test strip USE AS DIRECTED TO CHECK DAILY. 50 each 3  . pantoprazole (PROTONIX) 40 MG tablet Take 1 tablet by mouth  daily for acid reflux 90 tablet 1  . potassium chloride SA (K-DUR,KLOR-CON) 20 MEQ tablet Take 1 tablet by mouth  twice a day 180 tablet 1  . valACYclovir (VALTREX) 1000 MG tablet Take 1 tablet by mouth  daily as needed 90 tablet 1  . valsartan-hydrochlorothiazide (DIOVAN-HCT) 320-25 MG per tablet Take 1 tablet by mouth  daily 90 tablet 1  . warfarin (COUMADIN) 2 MG tablet Take 1 tablet by mouth  daily 90 tablet 0   No current facility-administered medications for this visit.    SURGICAL HISTORY:  Past Surgical History  Procedure Laterality Date  . Colonoscopy  10/07/2006    [id number] rectum/Status post right hemicolectomy. Normal residual colon  . Cervical cone biopsy    . Cataract extraction w/phaco Left 01/20/2014    Procedure: CATARACT EXTRACTION PHACO AND INTRAOCULAR LENS PLACEMENT (IOC);  Surgeon: Adonis Brook, MD;  Location: [place];  Service: Ophthalmology;  Laterality: Left;  . 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 Co2  ablation vulva perianal and vaginal dysplatic areas  02-15-2010  . Abdominal hysterectomy  1983    w/  appendectomy and left salpingoophorectom  . Hemicolectomy Right 04-27-2005    CARCINOMA   ASCENDING COLON  . Vulvectomy N/A 05/06/2014    Procedure: WIDE LOCAL EXCISION LEFT LABIA MAJORA;  Surgeon: Janie Morning, MD;  Location: [facility];  Service: Gynecology;  Laterality: N/A;  . Vulvar lesion removal N/A 05/06/2014     Procedure: LASER OF VULVAR LESION;  Surgeon: Janie Morning, MD;  Location: [facility];  Service: Gynecology;  Laterality: N/A;  . Lesion removal N/A 05/06/2014    Procedure: LASER OF THE VAGINA;  Surgeon: Janie Morning, MD;  Location: [facility];  Service: Gynecology;  Laterality: N/A;  . Esophagogastroduodenoscopy  04/02/2005    [id number] polyp with central erosion versus ulceration in the body of the stomach, resected and recovered Remainder of the gastric mucosa, D1, D2 appeared normal  . Colonoscopy  10/2009    RMR: diminutive RS  polyp (hyperplastic).   . Colonoscopy N/A 12/15/2014    Procedure: COLONOSCOPY;  Surgeon: Daneil Dolin, MD;  [facility];  Service: Endoscopy;  Laterality: N/A;  130     REVIEW OF SYSTEMS:  Constitutional: positive for fatigue Eyes: negative Ears, nose, mouth, throat, and face: negative Respiratory: negative Cardiovascular: negative Gastrointestinal: negative Genitourinary:negative Integument/breast: negative Hematologic/lymphatic: negative Musculoskeletal:negative Neurological: negative Behavioral/Psych: negative Endocrine: negative Allergic/Immunologic: negative   PHYSICAL EXAMINATION: General appearance: alert, cooperative and no distress Head: Normocephalic, without obvious abnormality, atraumatic Neck: no adenopathy Lymph nodes: Cervical, supraclavicular, and axillary nodes normal. Resp: clear to auscultation bilaterally Back: symmetric, no curvature. ROM normal. No CVA tenderness. Cardio: regular rate and rhythm, S1, S2 normal, no murmur, click, rub or gallop GI: soft, non-tender; bowel sounds normal; no masses,  no organomegaly Extremities: extremities normal, atraumatic, no cyanosis or edema Neurologic: Alert and oriented X 3, normal strength and tone. Normal symmetric reflexes. Normal coordination and gait  ECOG PERFORMANCE STATUS: 1 - Symptomatic but completely ambulatory  Blood  pressure 126/81, pulse 80, temperature 98.6 F (37 C), temperature source Oral, resp. rate 18, height $RemoveBe'5\' 6"'XbgkrXQFh$  (1.676 m), weight 201 lb (91.173 kg), SpO2 100 %.  LABORATORY DATA: Lab Results  Component Value Date   WBC 7.0 06/29/2015   HGB 10.2* 06/29/2015   HCT 32.8* 06/29/2015   MCV 97.0 06/29/2015   PLT 140* 06/29/2015      Chemistry      Component Value Date/Time   NA 143 05/26/2015 1445   NA 139 03/07/2015 1203   K 3.5 05/26/2015 1445   K 3.3* 03/07/2015 1203   CL 103 03/07/2015 1203   CL 105 06/17/2013 1441   CO2 26 05/26/2015 1445   CO2 27 03/07/2015 1203   BUN 6.7* 05/26/2015 1445   BUN 15 03/07/2015 1203   CREATININE 0.8 05/26/2015 1445   CREATININE 0.84 03/07/2015 1203   CREATININE 0.81 01/14/2014 1413      Component Value Date/Time   CALCIUM 8.9 05/26/2015 1445   CALCIUM 8.3* 03/07/2015 1203   ALKPHOS 62 05/26/2015 1445   ALKPHOS 51 03/07/2015 1203   AST 17 05/26/2015 1445   AST 14 03/07/2015 1203   ALT 20 05/26/2015 1445   ALT 19 03/07/2015 1203   BILITOT 1.06 05/26/2015 1445   BILITOT 1.0 03/07/2015 1203     Other lab results: Beta-2 microglobulin 2.88, free kappa Light chain 1.57, free lambda light chain 0.90 with a kappa/lambda ratio 1.74. IgG 320, IgA 1960 and IgM 17.  RADIOGRAPHIC STUDIES: No results found.  ASSESSMENT AND PLAN: This is a very pleasant 64 years old [place] American female with history of multiple myeloma currently on treatment with Revlimid and low-dose Decadron and tolerating her treatment fairly well. The patient is doing fine today with no specific complaints except for mild fatigue.  Because of the recent disease progression, I recommended for her to continue her treatment with Revlimid and Decadron but I increased the dose of Revlimid to 25 mg by mouth daily for 21 days every 4 weeks, status post 2 months of this regimen. The patient will continue her treatment with Zometa every 3 months as a scheduled. She will come back for  follow-up visit in 4 weeks for reevaluation with repeat myeloma panel. She was advised to call immediately if she has any concerning symptoms and interval. The patient voices understanding of current disease status and treatment options and is in agreement with the current care plan.  All questions were  answered. The patient knows to call the clinic with any problems, questions or concerns. We can certainly see the patient much sooner if necessary.  Disclaimer: This note was dictated with voice recognition software. Similar sounding words can inadvertently be transcribed and may not be corrected upon review.       

## 2015-06-29 NOTE — Telephone Encounter (Signed)
I sent pt to see Raquel about the application

## 2015-07-08 ENCOUNTER — Encounter: Payer: Self-pay | Admitting: Internal Medicine

## 2015-07-08 NOTE — Progress Notes (Signed)
I called and left the patient a message for patient to call me bck. She left me a message to call her.

## 2015-07-11 ENCOUNTER — Other Ambulatory Visit: Payer: Self-pay

## 2015-07-11 DIAGNOSIS — Z1231 Encounter for screening mammogram for malignant neoplasm of breast: Secondary | ICD-10-CM

## 2015-07-13 ENCOUNTER — Encounter: Payer: Self-pay | Admitting: Internal Medicine

## 2015-07-13 NOTE — Progress Notes (Signed)
I placed L&L forms on desk of nurse for dr. [name] and will send the patient hers to be filled out and returned to me

## 2015-07-14 ENCOUNTER — Other Ambulatory Visit: Payer: Self-pay | Admitting: Medical Oncology

## 2015-07-14 ENCOUNTER — Telehealth: Payer: Self-pay | Admitting: *Deleted

## 2015-07-14 DIAGNOSIS — C9000 Multiple myeloma not having achieved remission: Secondary | ICD-10-CM

## 2015-07-14 MED ORDER — LENALIDOMIDE 25 MG PO CAPS
25.0000 mg | ORAL_CAPSULE | Freq: Every day | ORAL | Status: DC
Start: 2015-07-14 — End: 2015-07-18

## 2015-07-14 MED ORDER — LENALIDOMIDE 25 MG PO CAPS: 25.0000 mg | ORAL_CAPSULE | Freq: Every day | ORAL | Status: DC

## 2015-07-14 NOTE — Telephone Encounter (Signed)
Patient called requesting for a refill for her Revlimid. Message sent to RN [name] B.

## 2015-07-14 NOTE — Addendum Note (Signed)
Addended by: Ardeen Garland on: 07/14/2015 02:39 PM   Modules accepted: Medications

## 2015-07-15 NOTE — Telephone Encounter (Signed)
Faxed revlimid refill to [facility]

## 2015-07-18 ENCOUNTER — Other Ambulatory Visit: Payer: Self-pay | Admitting: *Deleted

## 2015-07-18 DIAGNOSIS — I10 Essential (primary) hypertension: Secondary | ICD-10-CM | POA: Diagnosis not present

## 2015-07-18 DIAGNOSIS — E559 Vitamin D deficiency, unspecified: Secondary | ICD-10-CM | POA: Diagnosis not present

## 2015-07-18 DIAGNOSIS — C9000 Multiple myeloma not having achieved remission: Secondary | ICD-10-CM

## 2015-07-18 DIAGNOSIS — E119 Type 2 diabetes mellitus without complications: Secondary | ICD-10-CM | POA: Diagnosis not present

## 2015-07-18 MED ORDER — LENALIDOMIDE 25 MG PO CAPS: 25.0000 mg | ORAL_CAPSULE | Freq: Every day | ORAL | Status: DC

## 2015-07-18 NOTE — Telephone Encounter (Signed)
"  I called [facility] about my refill and learned it was sent to the wrong place.  Will someone call me and let me know what's going on."  [id number] Called patient to confirm no change in Insurance.  Authorization number obtained 07-14-2015 is still valid and will send to [facility]

## 2015-07-19 LAB — COMPLETE METABOLIC PANEL WITH GFR
ALT: 19 U/L (ref 0–35)
AST: 18 U/L (ref 0–37)
Albumin: 3.2 g/dL — ABNORMAL LOW (ref 3.5–5.2)
Alkaline Phosphatase: 54 U/L (ref 39–117)
BUN: 11 mg/dL (ref 6–23)
CALCIUM: 8.8 mg/dL (ref 8.4–10.5)
CHLORIDE: 103 meq/L (ref 96–112)
CO2: 26 meq/L (ref 19–32)
CREATININE: 0.79 mg/dL (ref 0.50–1.10)
GFR, EST NON AFRICAN AMERICAN: 78 mL/min
Glucose, Bld: 133 mg/dL — ABNORMAL HIGH (ref 70–99)
Potassium: 3.9 mEq/L (ref 3.5–5.3)
Sodium: 142 mEq/L (ref 135–145)
Total Bilirubin: 1.2 mg/dL (ref 0.2–1.2)
Total Protein: 6.4 g/dL (ref 6.0–8.3)

## 2015-07-19 LAB — HEMOGLOBIN A1C
Hgb A1c MFr Bld: 6.4 % — ABNORMAL HIGH (ref <5.7)
Mean Plasma Glucose: 137 mg/dL — ABNORMAL HIGH (ref <117)

## 2015-07-19 LAB — VITAMIN D 25 HYDROXY (VIT D DEFICIENCY, FRACTURES): VIT D 25 HYDROXY: 27 ng/mL — AB (ref 30–100)

## 2015-07-21 ENCOUNTER — Encounter: Payer: Self-pay | Admitting: Family Medicine

## 2015-07-21 ENCOUNTER — Telehealth: Payer: Self-pay | Admitting: *Deleted

## 2015-07-21 ENCOUNTER — Ambulatory Visit (INDEPENDENT_AMBULATORY_CARE_PROVIDER_SITE_OTHER): Payer: Medicare Other | Admitting: Family Medicine

## 2015-07-21 VITALS — BP 110/70 | HR 84 | Resp 16 | Ht 66.0 in | Wt 201.0 lb

## 2015-07-21 DIAGNOSIS — I10 Essential (primary) hypertension: Secondary | ICD-10-CM | POA: Diagnosis not present

## 2015-07-21 DIAGNOSIS — H409 Unspecified glaucoma: Secondary | ICD-10-CM

## 2015-07-21 DIAGNOSIS — K219 Gastro-esophageal reflux disease without esophagitis: Secondary | ICD-10-CM | POA: Diagnosis not present

## 2015-07-21 DIAGNOSIS — E049 Nontoxic goiter, unspecified: Secondary | ICD-10-CM | POA: Diagnosis not present

## 2015-07-21 DIAGNOSIS — E119 Type 2 diabetes mellitus without complications: Secondary | ICD-10-CM | POA: Diagnosis not present

## 2015-07-21 NOTE — Telephone Encounter (Signed)
VM message received from patient @ 9:15 am.  Return call to patient. She just needed clarification on her revlimid schedule. 21 days taking meds and 7 days of rest-not taking them. No other issues identified.

## 2015-07-21 NOTE — Patient Instructions (Addendum)
F/u in early January, call if you need me before  Flu vaccine in Sept , call and come in please  Keep mammogram appt  Metformin is ONE daily, call if diarrhea continues   You will get info on getting medciation through health dept since you are in the donut hole, and we will provide the necessary scripts  Keep gyne appt  Fasting lipid, cmp andEGFr, HBA1C and TSH early Jnauary  Thanks for choosing [facility], we consider it a privelige to serve you. Please work on good  health habits so that your health will improve. 1. Commitment to daily physical activity for 30 to 60  minutes, if you are able to do this.  2. Commitment to wise food choices. Aim for half of your  food intake to be vegetable and fruit, one quarter starchy foods, and one quarter protein. Try to eat on a regular schedule  3 meals per day, snacking between meals should be limited to vegetables or fruits or small portions of nuts. 64 ounces of water per day is generally recommended, unless you have specific health conditions, like heart failure or kidney failure where you will need to limit fluid intake.  3. Commitment to sufficient and a  good quality of physical and mental rest daily, generally between 6 to 8 hours per day.  WITH PERSISTANCE AND PERSEVERANCE, THE IMPOSSIBLE , BECOMES THE NORM! CONGRATS on weight los, pls commit to daily exercise

## 2015-07-22 ENCOUNTER — Other Ambulatory Visit (HOSPITAL_BASED_OUTPATIENT_CLINIC_OR_DEPARTMENT_OTHER): Payer: Medicare Other

## 2015-07-22 DIAGNOSIS — C9000 Multiple myeloma not having achieved remission: Secondary | ICD-10-CM | POA: Diagnosis not present

## 2015-07-22 LAB — CBC WITH DIFFERENTIAL/PLATELET
BASO%: 0.3 % (ref 0.0–2.0)
Basophils Absolute: 0 10*3/uL (ref 0.0–0.1)
EOS%: 0.1 % (ref 0.0–7.0)
Eosinophils Absolute: 0 10*3/uL (ref 0.0–0.5)
HCT: 31 % — ABNORMAL LOW (ref 34.8–46.6)
HEMOGLOBIN: 10 g/dL — AB (ref 11.6–15.9)
LYMPH%: 8.5 % — AB (ref 14.0–49.7)
MCH: 30.1 pg (ref 25.1–34.0)
MCHC: 32.2 g/dL (ref 31.5–36.0)
MCV: 93.4 fL (ref 79.5–101.0)
MONO#: 2.1 10*3/uL — AB (ref 0.1–0.9)
MONO%: 22 % — AB (ref 0.0–14.0)
NEUT%: 69.1 % (ref 38.4–76.8)
NEUTROS ABS: 6.6 10*3/uL — AB (ref 1.5–6.5)
PLATELETS: 173 10*3/uL (ref 145–400)
RBC: 3.31 10*6/uL — ABNORMAL LOW (ref 3.70–5.45)
RDW: 19.4 % — AB (ref 11.2–14.5)
WBC: 9.6 10*3/uL (ref 3.9–10.3)
lymph#: 0.8 10*3/uL — ABNORMAL LOW (ref 0.9–3.3)

## 2015-07-22 LAB — COMPREHENSIVE METABOLIC PANEL (CC13)
ALBUMIN: 3.4 g/dL — AB (ref 3.5–5.0)
ALK PHOS: 59 U/L (ref 40–150)
ALT: 18 U/L (ref 0–55)
AST: 14 U/L (ref 5–34)
Anion Gap: 11 mEq/L (ref 3–11)
BUN: 19.5 mg/dL (ref 7.0–26.0)
CALCIUM: 10 mg/dL (ref 8.4–10.4)
CHLORIDE: 105 meq/L (ref 98–109)
CO2: 24 mEq/L (ref 22–29)
Creatinine: 0.8 mg/dL (ref 0.6–1.1)
EGFR: 85 mL/min/{1.73_m2} — AB (ref >90)
Glucose: 109 mg/dl (ref 70–140)
Potassium: 4 mEq/L (ref 3.5–5.1)
SODIUM: 140 meq/L (ref 136–145)
Total Bilirubin: 0.96 mg/dL (ref 0.20–1.20)
Total Protein: 7.4 g/dL (ref 6.4–8.3)

## 2015-07-22 LAB — MICROALBUMIN / CREATININE URINE RATIO
Creatinine, Urine: 89.8 mg/dL
MICROALB/CREAT RATIO: 6.7 mg/g (ref 0.0–30.0)
Microalb, Ur: 0.6 mg/dL (ref <2.0)

## 2015-07-22 LAB — TECHNOLOGIST REVIEW

## 2015-07-22 LAB — LACTATE DEHYDROGENASE (CC13): LDH: 165 U/L (ref 125–245)

## 2015-07-25 ENCOUNTER — Encounter: Payer: Self-pay | Admitting: Internal Medicine

## 2015-07-25 LAB — BETA 2 MICROGLOBULIN, SERUM: Beta-2 Microglobulin: 2.23 mg/L (ref <=2.51)

## 2015-07-25 LAB — KAPPA/LAMBDA LIGHT CHAINS
KAPPA FREE LGHT CHN: 1.36 mg/dL (ref 0.33–1.94)
Kappa:Lambda Ratio: 4.25 — ABNORMAL HIGH (ref 0.26–1.65)
Lambda Free Lght Chn: 0.32 mg/dL — ABNORMAL LOW (ref 0.57–2.63)

## 2015-07-25 LAB — IGG, IGA, IGM
IGG (IMMUNOGLOBIN G), SERUM: 364 mg/dL — AB (ref 690–1700)
IgA: 1890 mg/dL — ABNORMAL HIGH (ref 69–380)
IgM, Serum: 19 mg/dL — ABNORMAL LOW (ref 52–322)

## 2015-07-25 NOTE — Progress Notes (Signed)
I faxed application for asst to [employer]  [phone]

## 2015-07-26 ENCOUNTER — Encounter: Payer: Self-pay | Admitting: Internal Medicine

## 2015-07-26 DIAGNOSIS — H4011X2 Primary open-angle glaucoma, moderate stage: Secondary | ICD-10-CM | POA: Diagnosis not present

## 2015-07-26 DIAGNOSIS — H04123 Dry eye syndrome of bilateral lacrimal glands: Secondary | ICD-10-CM | POA: Diagnosis not present

## 2015-07-26 NOTE — Progress Notes (Signed)
[facility]id [facility] 07/20/15

## 2015-07-27 ENCOUNTER — Encounter: Payer: Self-pay | Admitting: Internal Medicine

## 2015-07-27 ENCOUNTER — Ambulatory Visit (HOSPITAL_BASED_OUTPATIENT_CLINIC_OR_DEPARTMENT_OTHER): Payer: Medicare Other | Admitting: Internal Medicine

## 2015-07-27 ENCOUNTER — Telehealth: Payer: Self-pay | Admitting: Internal Medicine

## 2015-07-27 VITALS — BP 111/56 | HR 85 | Temp 98.4°F | Resp 20 | Ht 66.0 in | Wt 201.1 lb

## 2015-07-27 DIAGNOSIS — C9000 Multiple myeloma not having achieved remission: Secondary | ICD-10-CM

## 2015-07-27 NOTE — Progress Notes (Signed)
[place] [phone]   [phone]  OFFICE PROGRESS NOTE  [name], MD [address], [address] [place] [address]  DIAGNOSIS: Stage IIA IgA kappa multiple myeloma diagnosed in 2006   PRIOR THERAPY:  1) status post treatment with thalidomide and Decadron followed by autologous peripheral blood stem cell transplant with high-dose melphalan on 04/26/2006 at [facility].  2) the patient had evidence for disease recurrence in August of 2008 and she was treated with 6 cycles of Velcade completed on 03/30/2008 with response to the treatment but was discontinued secondary to neuropathy.  3) maintenance treatment with Revlimid 10 mg by mouth daily by the does was later increase to 15 mg by mouth daily secondary to biochemical recurrence with stabilization of her disease. The treatment with Revlimid 15 mg by mouth daily was discontinued secondary to evidence for disease progression.  CURRENT THERAPY:  1) Revlimid 25 mg by mouth daily for 3 weeks every 4 weeks in addition to Decadron currently at 20 mg by mouth on a weekly basis. She is status post 3 cycles. 2) Zometa 4 mg IV every 3 months. Next dose in 05/05/2015  ADVANCED DIRECTIVE: She does not have advanced directive and was given information.  INTERVAL HISTORY: Darlene Howard 64 y.o. female returns to the clinic today for routine monthly followup visit. She is feeling fine today with no specific complaints except for mild fatigue. She is tolerating her treatment with Revlimid and Decadron fairly well with no significant adverse effects. She status post 3 cycles. The patient denied having any fever or chills. She denied having any chest pain, shortness of breath, cough or hemoptysis. She has no weight loss or night sweats. She had repeat myeloma panel performed recently and she is here for evaluation and discussion of her lab results and recommendation regarding treatment of her  condition.  MEDICAL HISTORY: Past Medical History  Diagnosis Date  . Glaucoma     uses eye drops as instructed  . Personal history of colon cancer     ASCENDING COLON--  S/P  RIGHT HEMICOLECTOMY WITH NEGATIVE NODES/    NO RECURRENCE  . Herpes     takes Valtrex as needed  . GERD (gastroesophageal reflux disease)     takes Pantoprazole daily  . Hypertension     takes Amlodipine and Diovan daily  . Bruises easily     d/t being on COumadin  . History of colon polyps   . Type 2 diabetes mellitus   . Neuropathy associated with multiple myeloma     POST CHEMOTHERAPY AND STEM CELL TRANSPLANT  . H/O stem cell transplant     04-26-2006  AT [facility]  . Multiple myeloma DX  2006  STATE IIA,  IgA  KAPPA    STABLE  PER DR Curtis Uriarte--  S/P STEM CELL TRANSPLANT 2007 /  RECURRENCE 2008  CHEMO ENDED 03-30-2008/   NOW ON MAINTANANCE REVLIMID )  . Vocal cord nodule     ASSESSED BY DR TEOH--  LMA  OK W/ PROCEDURE'S  . DDD (degenerative disc disease), lumbar   . Cholelithiasis   . Fatty liver     ALLERGIES:  is allergic to codeine.  MEDICATIONS:  Current Outpatient Prescriptions  Medication Sig Dispense Refill  . amLODipine (NORVASC) 2.5 MG tablet Take 1 tablet by mouth  daily in the morning 90 tablet 1  . aspirin (ASPIRIN LOW DOSE) 81 MG EC tablet Take 81 mg by mouth  daily.      . bimatoprost (LUMIGAN) 0.03 % ophthalmic drops Place 1 drop into both eyes at bedtime.     . calcium-vitamin D (OSCAL 500/200 D-3) 500-200 MG-UNIT per tablet Take 1 tablet by mouth 3 (three) times daily.      . cholecalciferol (VITAMIN D) 1000 UNITS tablet Take 1,000 Units by mouth daily.    Marland Kitchen conjugated estrogens (PREMARIN) vaginal cream Place 1 Applicatorful vaginally 2 (two) times a week. 42.5 g 12  . dexamethasone (DECADRON) 4 MG tablet Take 5 tablets by mouth on  a weekly basis as directed 60 tablet 1  . dorzolamide-timolol (COSOPT) 22.3-6.8 MG/ML ophthalmic solution Place 1 drop into both eyes 2 (two) times daily.     . DUREZOL 0.05 % EMUL     . gabapentin (NEURONTIN) 100 MG capsule Take 1 capsule by mouth 3  times daily 270 capsule 1  . lenalidomide (REVLIMID) 25 MG capsule Take 1 capsule (25 mg total) by mouth daily. take 25 mg every day for 21 days every 4 weeks. 21 capsule 0  . loratadine (CLARITIN) 10 MG tablet TAKE ONE TABLET BY MOUTH ONCE DAILY. 30 tablet 3  . metFORMIN (GLUCOPHAGE) 1000 MG tablet Take 1 tablet by mouth  daily with breakfast 90 tablet 1  . Multiple Vitamins-Minerals (CENTRUM SILVER PO) Take 1 tablet by mouth daily.     . ONE TOUCH ULTRA TEST test strip USE AS DIRECTED TO CHECK DAILY. 50 each 3  . pantoprazole (PROTONIX) 40 MG tablet Take 1 tablet by mouth  daily for acid reflux 90 tablet 1  . potassium chloride SA (K-DUR,KLOR-CON) 20 MEQ tablet Take 1 tablet by mouth  twice a day 180 tablet 1  . valACYclovir (VALTREX) 1000 MG tablet Take 1 tablet by mouth  daily as needed 90 tablet 1  . valsartan-hydrochlorothiazide (DIOVAN-HCT) 320-25 MG per tablet Take 1 tablet by mouth  daily 90 tablet 1  . warfarin (COUMADIN) 2 MG tablet Take 1 tablet by mouth  daily 90 tablet 0   No current facility-administered medications for this visit.    SURGICAL HISTORY:  Past Surgical History  Procedure Laterality Date  . Colonoscopy  10/07/2006    GEX:[id number] rectum/Status post right hemicolectomy. Normal residual colon  . Cervical cone biopsy    . Cataract extraction w/phaco Left 01/20/2014    Procedure: CATARACT EXTRACTION PHACO AND INTRAOCULAR LENS PLACEMENT (IOC);  Surgeon: Adonis Brook, MD;  Location: [place];  Service: Ophthalmology;  Laterality: Left;  . 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 Co2  ablation vulva perianal and vaginal dysplatic areas  02-15-2010  . Abdominal hysterectomy  1983    w/  appendectomy and left salpingoophorectom  . Hemicolectomy Right 04-27-2005    CARCINOMA   ASCENDING COLON  . Vulvectomy N/A 05/06/2014    Procedure: WIDE LOCAL EXCISION LEFT LABIA  MAJORA;  Surgeon: Janie Morning, MD;  Location: [facility];  Service: Gynecology;  Laterality: N/A;  . Vulvar lesion removal N/A 05/06/2014    Procedure: LASER OF VULVAR LESION;  Surgeon: Janie Morning, MD;  Location: [facility];  Service: Gynecology;  Laterality: N/A;  . Lesion removal N/A 05/06/2014    Procedure: LASER OF THE VAGINA;  Surgeon: Janie Morning, MD;  Location: [facility];  Service: Gynecology;  Laterality: N/A;  . Esophagogastroduodenoscopy  04/02/2005    KGM:[id number] polyp with central erosion versus ulceration in the body of the stomach, resected and recovered  Remainder of the gastric mucosa, D1, D2 appeared normal  . Colonoscopy  10/2009    RMR: diminutive RS polyp (hyperplastic).   . Colonoscopy N/A 12/15/2014    Procedure: COLONOSCOPY;  Surgeon: Daneil Dolin, MD;  [facility];  Service: Endoscopy;  Laterality: N/A;  130     REVIEW OF SYSTEMS:  Constitutional: positive for fatigue Eyes: negative Ears, nose, mouth, throat, and face: negative Respiratory: negative Cardiovascular: negative Gastrointestinal: negative Genitourinary:negative Integument/breast: negative Hematologic/lymphatic: negative Musculoskeletal:negative Neurological: negative Behavioral/Psych: negative Endocrine: negative Allergic/Immunologic: negative   PHYSICAL EXAMINATION: General appearance: alert, cooperative and no distress Head: Normocephalic, without obvious abnormality, atraumatic Neck: no adenopathy Lymph nodes: Cervical, supraclavicular, and axillary nodes normal. Resp: clear to auscultation bilaterally Back: symmetric, no curvature. ROM normal. No CVA tenderness. Cardio: regular rate and rhythm, S1, S2 normal, no murmur, click, rub or gallop GI: soft, non-tender; bowel sounds normal; no masses,  no organomegaly Extremities: extremities normal, atraumatic, no cyanosis or edema Neurologic: Alert and oriented X 3,  normal strength and tone. Normal symmetric reflexes. Normal coordination and gait  ECOG PERFORMANCE STATUS: 1 - Symptomatic but completely ambulatory  Blood pressure 111/56, pulse 85, temperature 98.4 F (36.9 C), temperature source Oral, resp. rate 20, height '5\' 6"'  (1.676 m), weight 201 lb 1.6 oz (91.218 kg), SpO2 100 %.  LABORATORY DATA: Lab Results  Component Value Date   WBC 9.6 07/22/2015   HGB 10.0* 07/22/2015   HCT 31.0* 07/22/2015   MCV 93.4 07/22/2015   PLT 173 07/22/2015      Chemistry      Component Value Date/Time   NA 140 07/22/2015 1443   NA 142 07/18/2015 1015   K 4.0 07/22/2015 1443   K 3.9 07/18/2015 1015   CL 103 07/18/2015 1015   CL 105 06/17/2013 1441   CO2 24 07/22/2015 1443   CO2 26 07/18/2015 1015   BUN 19.5 07/22/2015 1443   BUN 11 07/18/2015 1015   CREATININE 0.8 07/22/2015 1443   CREATININE 0.79 07/18/2015 1015   CREATININE 0.81 01/14/2014 1413      Component Value Date/Time   CALCIUM 10.0 07/22/2015 1443   CALCIUM 8.8 07/18/2015 1015   ALKPHOS 59 07/22/2015 1443   ALKPHOS 54 07/18/2015 1015   AST 14 07/22/2015 1443   AST 18 07/18/2015 1015   ALT 18 07/22/2015 1443   ALT 19 07/18/2015 1015   BILITOT 0.96 07/22/2015 1443   BILITOT 1.2 07/18/2015 1015     Other lab results: Beta-2 microglobulin 2.23, free kappa Light chain 1.36, free lambda light chain 0.32 with a kappa/lambda ratio 1.74. IgG 364, IgA 1890 and IgM 19.  RADIOGRAPHIC STUDIES: No results found.  ASSESSMENT AND PLAN: This is a very pleasant 64 years old [place] American female with history of multiple myeloma currently on treatment with Revlimid and low-dose Decadron and tolerating her treatment fairly well. The patient is doing fine today with no specific complaints except for mild fatigue.  Because of the recent disease progression, I recommended for her to continue her treatment with Revlimid and Decadron but I increased the dose of Revlimid to 25 mg by mouth daily for 21  days every 4 weeks, status post 3 months of this regimen. The patient myeloma panel showed no evidence for disease progression. I discussed the lab result with the patient and recommended for her to continue with her current treatment with Revlimid and Decadron. The patient will continue her treatment with Zometa every 3 months as a scheduled.  She will come back for follow-up visit in 4 weeks for reevaluation with blood work. She was advised to call immediately if she has any concerning symptoms and interval. The patient voices understanding of current disease status and treatment options and is in agreement with the current care plan.  All questions were answered. The patient knows to call the clinic with any problems, questions or concerns. We can certainly see the patient much sooner if necessary.  Disclaimer: This note was dictated with voice recognition software. Similar sounding words can inadvertently be transcribed and may not be corrected upon review.

## 2015-07-27 NOTE — Telephone Encounter (Signed)
per pof to sch pt appt-gave pt copy of avs °

## 2015-07-28 ENCOUNTER — Telehealth: Payer: Self-pay | Admitting: Family Medicine

## 2015-07-28 NOTE — Telephone Encounter (Signed)
Pls call pt  Let her know if she decides to get the revlimid through [facility], her oncologist will need to prescribe it. I can get her help for her other meds however, that I prescribe Let her know that I heard from [facility] that she is holding off on help now, but help is available for her if she qualifies

## 2015-08-04 ENCOUNTER — Ambulatory Visit (HOSPITAL_BASED_OUTPATIENT_CLINIC_OR_DEPARTMENT_OTHER): Payer: Medicare Other

## 2015-08-04 VITALS — BP 155/62 | HR 80 | Temp 98.0°F | Resp 20

## 2015-08-04 DIAGNOSIS — C9000 Multiple myeloma not having achieved remission: Secondary | ICD-10-CM

## 2015-08-04 MED ORDER — ZOLEDRONIC ACID 4 MG/100ML IV SOLN
4.0000 mg | Freq: Once | INTRAVENOUS | Status: AC
  Administered 2015-08-04: 4 mg via INTRAVENOUS
  Filled 2015-08-04: qty 100

## 2015-08-04 MED ORDER — SODIUM CHLORIDE 0.9 % IV SOLN
Freq: Once | INTRAVENOUS | Status: AC
  Administered 2015-08-04: 15:00:00 via INTRAVENOUS

## 2015-08-04 NOTE — Patient Instructions (Signed)

## 2015-08-08 ENCOUNTER — Ambulatory Visit
Admission: RE | Admit: 2015-08-08 | Discharge: 2015-08-08 | Disposition: A | Discharge status: Home / Self Care | Payer: Medicare Other | Source: Ambulatory Visit

## 2015-08-08 DIAGNOSIS — Z1231 Encounter for screening mammogram for malignant neoplasm of breast: Secondary | ICD-10-CM

## 2015-08-10 NOTE — Telephone Encounter (Signed)
Pt aware- asked about the other meds and they were not too expensive

## 2015-08-12 ENCOUNTER — Other Ambulatory Visit: Payer: Self-pay | Admitting: *Deleted

## 2015-08-12 ENCOUNTER — Telehealth: Payer: Self-pay | Admitting: *Deleted

## 2015-08-12 DIAGNOSIS — C9000 Multiple myeloma not having achieved remission: Secondary | ICD-10-CM

## 2015-08-12 MED ORDER — LENALIDOMIDE 25 MG PO CAPS: 25.0000 mg | ORAL_CAPSULE | Freq: Every day | ORAL | Status: DC

## 2015-08-12 NOTE — Telephone Encounter (Signed)
TC from patient requesting refill on her Revlimid. Last filled 07/18/15

## 2015-08-12 NOTE — Telephone Encounter (Signed)
Pt survey completed, authorization # [id number] 08/12/15- refill sent via e-scribe to [facility]

## 2015-08-13 NOTE — Progress Notes (Signed)
Darlene Howard     MRN: [id number]      DOB: [birth year]   HPI Darlene Howard is here for follow up and re-evaluation of chronic medical conditions, medication management and review of any available recent lab and radiology data.  Preventive health is updated, specifically  Cancer screening and Immunization.   Questions or concerns regarding consultations or procedures which the PT has had in the interim are  addressed. The PT denies any adverse reactions to current medications since the last visit.  There are no new concerns.  There are no specific complaints   ROS Denies recent fever or chills. Denies sinus pressure, nasal congestion, ear pain or sore throat. Denies chest congestion, productive cough or wheezing. Denies chest pains, palpitations and leg swelling Denies abdominal pain, nausea, vomitingc/o excess loose stool , which she believes is from metformin , wants to look into this Denies dysuria, frequency, hesitancy or incontinence. Denies joint pain, swelling and limitation in mobility. Denies headaches, seizures, numbness, or tingling. Denies depression, anxiety or insomnia. Denies skin break down or rash.   PE  BP 110/70 mmHg  Pulse 84  Resp 16  Ht '5\' 6"'$  (1.676 m)  Wt 201 lb (91.173 kg)  BMI 32.46 kg/m2  SpO2 98%  Patient alert and oriented and in no cardiopulmonary distress.  HEENT: No facial asymmetry, EOMI,   oropharynx pink and moist.  Neck supple no JVD, no mass.  Chest: Clear to auscultation bilaterally.  CVS: S1, S2 no murmurs, no S3.Regular rate.  ABD: Soft non tender.   Ext: No edema  MS: Adequate ROM spine, shoulders, hips and knees.  Skin: Intact, no ulcerations or rash noted.  Psych: Good eye contact, normal affect. Memory intact not anxious or depressed appearing.  CNS: CN 2-12 intact, power,  normal throughout.no focal deficits noted.   Assessment & Plan   Type 2 diabetes mellitus, controlled Controlled, reduce metformin dose Darlene Howard  is reminded of the importance of commitment to daily physical activity for 30 minutes or more, as able and the need to limit carbohydrate intake to 30 to 60 grams per meal to help with blood sugar control.   The need to take medication as prescribed, test blood sugar as directed, and to call between visits if there is a concern that blood sugar is uncontrolled is also discussed.   Darlene Howard is reminded of the importance of daily foot exam, annual eye examination, and good blood sugar, blood pressure and cholesterol control.  Diabetic Labs Latest Ref Rng 07/22/2015 07/21/2015 07/18/2015 06/29/2015 05/26/2015  HbA1c <5.7 % - - 6.4(H) - -  Microalbumin <2.0 mg/dL - 0.6 - - -  Micro/Creat Ratio 0.0 - 30.0 mg/g - 6.7 - - -  Chol 0 - 200 mg/dL - - - - -  HDL >=46 mg/dL - - - - -  Calc LDL 0 - 99 mg/dL - - - - -  Triglycerides <150 mg/dL - - - - -  Creatinine 0.6 - 1.1 mg/dL 0.8 - 0.79 0.8 0.8   BP/Weight 08/04/2015 07/27/2015 07/21/2015 06/29/2015 05/26/2015 05/04/2015 0/62/6948  Systolic BP 546 270 350 [phone] 371  Diastolic BP 62 56 70 81 70 49 63  Wt. (Lbs) - 201.1 201 201 204.1 - 205.8  BMI - 32.47 32.46 32.46 32.96 - 33.23   Foot/eye exam completion dates Latest Ref Rng 07/21/2015 09/23/2014  Eye Exam No Retinopathy - No Retinopathy  Foot exam Order - - -  Foot Form Completion -  Done -         Essential hypertension Controlled, no change in medication DASH diet and commitment to daily physical activity for a minimum of 30 minutes discussed and encouraged, as a part of hypertension management. The importance of attaining a healthy weight is also discussed.  BP/Weight 08/04/2015 07/27/2015 07/21/2015 06/29/2015 05/26/2015 05/04/2015 1/61/0960  Systolic BP 454 098 119 147 829 562 130  Diastolic BP 62 56 70 81 70 49 63  Wt. (Lbs) - 201.1 201 201 204.1 - 205.8  BMI - 32.47 32.46 32.46 32.96 - 33.23        GERD (gastroesophageal reflux disease) Controlled, no change in  medication   OBESITY Improved. Patient re-educated about  the importance of commitment to a  minimum of 150 minutes of exercise per week.  The importance of healthy food choices with portion control discussed. Encouraged to start a food diary, count calories and to consider  joining a support group. Sample diet sheets offered. Goals set by the patient for the next several months.   Weight /BMI 07/27/2015 07/21/2015 06/29/2015  WEIGHT 201 lb 1.6 oz 201 lb 201 lb  HEIGHT '5\' 6"'$  '5\' 6"'$  '5\' 6"'$   BMI 32.47 kg/m2 32.46 kg/m2 32.46 kg/m2    Current exercise per week 90 minutes.   Glaucoma Followed closely by opthalmology and well controlled

## 2015-08-13 NOTE — Telephone Encounter (Signed)
Ok to refill 

## 2015-08-13 NOTE — Assessment & Plan Note (Signed)
Controlled, reduce metformin dose Darlene Howard is reminded of the importance of commitment to daily physical activity for 30 minutes or more, as able and the need to limit carbohydrate intake to 30 to 60 grams per meal to help with blood sugar control.   The need to take medication as prescribed, test blood sugar as directed, and to call between visits if there is a concern that blood sugar is uncontrolled is also discussed.   Darlene Howard is reminded of the importance of daily foot exam, annual eye examination, and good blood sugar, blood pressure and cholesterol control.  Diabetic Labs Latest Ref Rng 07/22/2015 07/21/2015 07/18/2015 06/29/2015 05/26/2015  HbA1c <5.7 % - - 6.4(H) - -  Microalbumin <2.0 mg/dL - 0.6 - - -  Micro/Creat Ratio 0.0 - 30.0 mg/g - 6.7 - - -  Chol 0 - 200 mg/dL - - - - -  HDL >=46 mg/dL - - - - -  Calc LDL 0 - 99 mg/dL - - - - -  Triglycerides <150 mg/dL - - - - -  Creatinine 0.6 - 1.1 mg/dL 0.8 - 0.79 0.8 0.8   BP/Weight 08/04/2015 07/27/2015 07/21/2015 06/29/2015 05/26/2015 05/04/2015 7/93/9688  Systolic BP 648 472 072 182 883 374 451  Diastolic BP 62 56 70 81 70 49 63  Wt. (Lbs) - 201.1 201 201 204.1 - 205.8  BMI - 32.47 32.46 32.46 32.96 - 33.23   Foot/eye exam completion dates Latest Ref Rng 07/21/2015 09/23/2014  Eye Exam No Retinopathy - No Retinopathy  Foot exam Order - - -  Foot Form Completion - Done -

## 2015-08-13 NOTE — Assessment & Plan Note (Signed)
Improved. Patient re-educated about  the importance of commitment to a  minimum of 150 minutes of exercise per week.  The importance of healthy food choices with portion control discussed. Encouraged to start a food diary, count calories and to consider  joining a support group. Sample diet sheets offered. Goals set by the patient for the next several months.   Weight /BMI 07/27/2015 07/21/2015 06/29/2015  WEIGHT 201 lb 1.6 oz 201 lb 201 lb  HEIGHT '5\' 6"'$  '5\' 6"'$  '5\' 6"'$   BMI 32.47 kg/m2 32.46 kg/m2 32.46 kg/m2    Current exercise per week 90 minutes.

## 2015-08-13 NOTE — Assessment & Plan Note (Signed)
Controlled, no change in medication DASH diet and commitment to daily physical activity for a minimum of 30 minutes discussed and encouraged, as a part of hypertension management. The importance of attaining a healthy weight is also discussed.  BP/Weight 08/04/2015 07/27/2015 07/21/2015 06/29/2015 05/26/2015 05/04/2015 3/81/7711  Systolic BP 657 903 833 383 291 916 606  Diastolic BP 62 56 70 81 70 49 63  Wt. (Lbs) - 201.1 201 201 204.1 - 205.8  BMI - 32.47 32.46 32.46 32.96 - 33.23

## 2015-08-13 NOTE — Assessment & Plan Note (Signed)
Controlled, no change in medication  

## 2015-08-13 NOTE — Assessment & Plan Note (Signed)
Followed closely by opthalmology and well controlled

## 2015-08-15 ENCOUNTER — Other Ambulatory Visit: Payer: Self-pay | Admitting: Medical Oncology

## 2015-08-16 ENCOUNTER — Other Ambulatory Visit: Payer: Self-pay | Admitting: Internal Medicine

## 2015-08-16 ENCOUNTER — Other Ambulatory Visit: Payer: Self-pay | Admitting: Family Medicine

## 2015-08-16 DIAGNOSIS — C9000 Multiple myeloma not having achieved remission: Secondary | ICD-10-CM

## 2015-08-24 ENCOUNTER — Telehealth: Payer: Self-pay | Admitting: Internal Medicine

## 2015-08-24 ENCOUNTER — Ambulatory Visit (HOSPITAL_BASED_OUTPATIENT_CLINIC_OR_DEPARTMENT_OTHER): Payer: Medicare Other | Admitting: Physician Assistant

## 2015-08-24 ENCOUNTER — Other Ambulatory Visit (HOSPITAL_BASED_OUTPATIENT_CLINIC_OR_DEPARTMENT_OTHER): Payer: Medicare Other

## 2015-08-24 VITALS — BP 124/59 | HR 75 | Temp 97.7°F | Resp 18 | Ht 66.0 in | Wt 205.1 lb

## 2015-08-24 DIAGNOSIS — R5383 Other fatigue: Secondary | ICD-10-CM

## 2015-08-24 DIAGNOSIS — C9000 Multiple myeloma not having achieved remission: Secondary | ICD-10-CM | POA: Diagnosis not present

## 2015-08-24 LAB — COMPREHENSIVE METABOLIC PANEL (CC13)
ALBUMIN: 3.1 g/dL — AB (ref 3.5–5.0)
ALK PHOS: 60 U/L (ref 40–150)
ALT: 25 U/L (ref 0–55)
ANION GAP: 10 meq/L (ref 3–11)
AST: 17 U/L (ref 5–34)
BILIRUBIN TOTAL: 1.2 mg/dL (ref 0.20–1.20)
BUN: 11.1 mg/dL (ref 7.0–26.0)
CALCIUM: 9.1 mg/dL (ref 8.4–10.4)
CO2: 26 mEq/L (ref 22–29)
CREATININE: 1 mg/dL (ref 0.6–1.1)
Chloride: 107 mEq/L (ref 98–109)
EGFR: 69 mL/min/{1.73_m2} — ABNORMAL LOW (ref >90)
Glucose: 105 mg/dl (ref 70–140)
Potassium: 3.9 mEq/L (ref 3.5–5.1)
Sodium: 142 mEq/L (ref 136–145)
TOTAL PROTEIN: 6.7 g/dL (ref 6.4–8.3)

## 2015-08-24 LAB — CBC WITH DIFFERENTIAL/PLATELET
BASO%: 0.9 % (ref 0.0–2.0)
BASOS ABS: 0.1 10*3/uL (ref 0.0–0.1)
EOS ABS: 0.1 10*3/uL (ref 0.0–0.5)
EOS%: 2.3 % (ref 0.0–7.0)
HEMATOCRIT: 30.9 % — AB (ref 34.8–46.6)
HGB: 9.6 g/dL — ABNORMAL LOW (ref 11.6–15.9)
LYMPH%: 10.7 % — ABNORMAL LOW (ref 14.0–49.7)
MCH: 30.1 pg (ref 25.1–34.0)
MCHC: 31.1 g/dL — ABNORMAL LOW (ref 31.5–36.0)
MCV: 96.9 fL (ref 79.5–101.0)
MONO#: 0.5 10*3/uL (ref 0.1–0.9)
MONO%: 9.4 % (ref 0.0–14.0)
NEUT%: 76.7 % (ref 38.4–76.8)
NEUTROS ABS: 4.3 10*3/uL (ref 1.5–6.5)
NRBC: 6 % — AB (ref 0–0)
PLATELETS: 128 10*3/uL — AB (ref 145–400)
RBC: 3.19 10*6/uL — ABNORMAL LOW (ref 3.70–5.45)
RDW: 17.7 % — AB (ref 11.2–14.5)
WBC: 5.6 10*3/uL (ref 3.9–10.3)
lymph#: 0.6 10*3/uL — ABNORMAL LOW (ref 0.9–3.3)

## 2015-08-24 LAB — TECHNOLOGIST REVIEW

## 2015-08-24 LAB — LACTATE DEHYDROGENASE (CC13): LDH: 178 U/L (ref 125–245)

## 2015-08-24 NOTE — Telephone Encounter (Signed)
Gave and printed appt sched and avs for pt for Sept and NOV

## 2015-08-31 NOTE — Patient Instructions (Signed)
Continue on Revlimid and dexamethasone at the current doses. Follow up in one month

## 2015-08-31 NOTE — Progress Notes (Addendum)
[place] [phone]   [phone]) [phone]  OFFICE PROGRESS NOTE  Darlene Nakayama, MD [address], [address] [place] [address]  DIAGNOSIS: Stage IIA IgA kappa multiple myeloma diagnosed in 2006   PRIOR THERAPY:  1) status post treatment with thalidomide and Decadron followed by autologous peripheral blood stem cell transplant with high-dose melphalan on 04/26/2006 at [facility].  2) the patient had evidence for disease recurrence in August of 2008 and she was treated with 6 cycles of Velcade completed on 03/30/2008 with response to the treatment but was discontinued secondary to neuropathy.  3) maintenance treatment with Revlimid 10 mg by mouth daily by the does was later increase to 15 mg by mouth daily secondary to biochemical recurrence with stabilization of her disease. The treatment with Revlimid 15 mg by mouth daily was discontinued secondary to evidence for disease progression.  CURRENT THERAPY:  1) Revlimid 25 mg by mouth daily for 3 weeks every 4 weeks in addition to Decadron currently at 20 mg by mouth on a weekly basis. She is status post 4 cycles. 2) Zometa 4 mg IV every 3 months. Next dose in November 2016  ADVANCED DIRECTIVE: She does not have advanced directive and was given information.  INTERVAL HISTORY: Darlene Howard 64 y.o. female returns to the clinic today for routine monthly followup visit. She is feeling fine today with no specific complaints except for mild fatigue. She is tolerating her treatment with Revlimid and Decadron fairly well with no significant adverse effects. She status post 3 cycles. The patient denied having any fever or chills. She denied having any chest pain, shortness of breath, cough or hemoptysis. She has no weight loss or night sweats.   MEDICAL HISTORY: Past Medical History  Diagnosis Date  . Glaucoma     uses eye drops as instructed  . Personal history of colon cancer     ASCENDING COLON--   S/P  RIGHT HEMICOLECTOMY WITH NEGATIVE NODES/    NO RECURRENCE  . Herpes     takes Valtrex as needed  . GERD (gastroesophageal reflux disease)     takes Pantoprazole daily  . Hypertension     takes Amlodipine and Diovan daily  . Bruises easily     d/t being on COumadin  . History of colon polyps   . Type 2 diabetes mellitus   . Neuropathy associated with multiple myeloma     POST CHEMOTHERAPY AND STEM CELL TRANSPLANT  . H/O stem cell transplant     04-26-2006  AT [facility]  . Multiple myeloma DX  2006  STATE IIA,  IgA  KAPPA    STABLE  PER DR MOHAMED--  S/P STEM CELL TRANSPLANT 2007 /  RECURRENCE 2008  CHEMO ENDED 03-30-2008/   NOW ON MAINTANANCE REVLIMID )  . Vocal cord nodule     ASSESSED BY DR TEOH--  LMA  OK W/ PROCEDURE'S  . DDD (degenerative disc disease), lumbar   . Cholelithiasis   . Fatty liver     ALLERGIES:  is allergic to codeine.  MEDICATIONS:  Current Outpatient Prescriptions  Medication Sig Dispense Refill  . amLODipine (NORVASC) 2.5 MG tablet Take 1 tablet by mouth  daily in the morning 90 tablet 1  . aspirin (ASPIRIN LOW DOSE) 81 MG EC tablet Take 81 mg by mouth daily.      . bimatoprost (LUMIGAN) 0.03 % ophthalmic drops Place 1 drop into both eyes at bedtime.     Marland Kitchen  calcium-vitamin D (OSCAL 500/200 D-3) 500-200 MG-UNIT per tablet Take 1 tablet by mouth 3 (three) times daily.      . cholecalciferol (VITAMIN D) 1000 UNITS tablet Take 1,000 Units by mouth daily.    Marland Kitchen conjugated estrogens (PREMARIN) vaginal cream Place 1 Applicatorful vaginally 2 (two) times a week. 42.5 g 12  . dexamethasone (DECADRON) 4 MG tablet Take 5 tablets by mouth on  a weekly basis as directed 60 tablet 1  . dorzolamide-timolol (COSOPT) 22.3-6.8 MG/ML ophthalmic solution Place 1 drop into both eyes 2 (two) times daily.    . DUREZOL 0.05 % EMUL     . gabapentin (NEURONTIN) 100 MG capsule Take 1 capsule by mouth 3  times daily 270 capsule 0  . lenalidomide (REVLIMID) 25 MG capsule Take 1 capsule  (25 mg total) by mouth daily. take 25 mg every day for 21 days every 4 weeks. 21 capsule 0  . loratadine (CLARITIN) 10 MG tablet TAKE ONE TABLET BY MOUTH ONCE DAILY. 30 tablet 3  . metFORMIN (GLUCOPHAGE) 1000 MG tablet Take 1 tablet by mouth  daily with breakfast 90 tablet 1  . Multiple Vitamins-Minerals (CENTRUM SILVER PO) Take 1 tablet by mouth daily.     . ONE TOUCH ULTRA TEST test strip USE AS DIRECTED TO CHECK DAILY. 50 each 3  . pantoprazole (PROTONIX) 40 MG tablet Take 1 tablet by mouth  daily for acid reflux 90 tablet 1  . potassium chloride SA (K-DUR,KLOR-CON) 20 MEQ tablet Take 1 tablet by mouth  twice a day 180 tablet 1  . valACYclovir (VALTREX) 1000 MG tablet Take 1 tablet by mouth  daily as needed 90 tablet 1  . valsartan-hydrochlorothiazide (DIOVAN-HCT) 320-25 MG per tablet Take 1 tablet by mouth  daily 90 tablet 1  . warfarin (COUMADIN) 2 MG tablet Take 1 tablet by mouth  daily 90 tablet 0   No current facility-administered medications for this visit.    SURGICAL HISTORY:  Past Surgical History  Procedure Laterality Date  . Colonoscopy  10/07/2006    [id number] rectum/Status post right hemicolectomy. Normal residual colon  . Cervical cone biopsy    . Cataract extraction w/phaco Left 01/20/2014    Procedure: CATARACT EXTRACTION PHACO AND INTRAOCULAR LENS PLACEMENT (IOC);  Surgeon: Adonis Brook, MD;  Location: [facility];  Service: Ophthalmology;  Laterality: Left;  . 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 Co2  ablation vulva perianal and vaginal dysplatic areas  02-15-2010  . Abdominal hysterectomy  1983    w/  appendectomy and left salpingoophorectom  . Hemicolectomy Right 04-27-2005    CARCINOMA   ASCENDING COLON  . Vulvectomy N/A 05/06/2014    Procedure: WIDE LOCAL EXCISION LEFT LABIA MAJORA;  Surgeon: Janie Morning, MD;  Location: [facility];  Service: Gynecology;  Laterality: N/A;  . Vulvar lesion removal N/A 05/06/2014    Procedure: LASER OF  VULVAR LESION;  Surgeon: Janie Morning, MD;  Location: [facility];  Service: Gynecology;  Laterality: N/A;  . Lesion removal N/A 05/06/2014    Procedure: LASER OF THE VAGINA;  Surgeon: Janie Morning, MD;  Location: [facility];  Service: Gynecology;  Laterality: N/A;  . Esophagogastroduodenoscopy  04/02/2005    [id number] polyp with central erosion versus ulceration in the body of the stomach, resected and recovered Remainder of the gastric mucosa, D1, D2 appeared normal  . Colonoscopy  10/2009    RMR: diminutive RS polyp (hyperplastic).   . Colonoscopy  N/A 12/15/2014    Procedure: COLONOSCOPY;  Surgeon: Daneil Dolin, MD;  [facility];  Service: Endoscopy;  Laterality: N/A;  130     REVIEW OF SYSTEMS:  Constitutional: positive for fatigue Eyes: negative Ears, nose, mouth, throat, and face: negative Respiratory: negative Cardiovascular: negative Gastrointestinal: negative Genitourinary:negative Integument/breast: negative Hematologic/lymphatic: negative Musculoskeletal:negative Neurological: negative Behavioral/Psych: negative Endocrine: negative Allergic/Immunologic: negative   PHYSICAL EXAMINATION: General appearance: alert, cooperative and no distress Head: Normocephalic, without obvious abnormality, atraumatic Neck: no adenopathy Lymph nodes: Cervical, supraclavicular, and axillary nodes normal. Resp: clear to auscultation bilaterally Back: symmetric, no curvature. ROM normal. No CVA tenderness. Cardio: regular rate and rhythm, S1, S2 normal, no murmur, click, rub or gallop GI: soft, non-tender; bowel sounds normal; no masses,  no organomegaly Extremities: extremities normal, atraumatic, no cyanosis or edema Neurologic: Alert and oriented X 3, normal strength and tone. Normal symmetric reflexes. Normal coordination and gait  ECOG PERFORMANCE STATUS: 1 - Symptomatic but completely ambulatory  Blood pressure 124/59,  pulse 75, temperature 97.7 F (36.5 C), temperature source Oral, resp. rate 18, height '5\' 6"'  (1.676 m), weight 205 lb 1.6 oz (93.033 kg), SpO2 100 %.  LABORATORY DATA: Lab Results  Component Value Date   WBC 5.6 08/24/2015   HGB 9.6* 08/24/2015   HCT 30.9* 08/24/2015   MCV 96.9 08/24/2015   PLT 128* 08/24/2015      Chemistry      Component Value Date/Time   NA 142 08/24/2015 1349   NA 142 07/18/2015 1015   K 3.9 08/24/2015 1349   K 3.9 07/18/2015 1015   CL 103 07/18/2015 1015   CL 105 06/17/2013 1441   CO2 26 08/24/2015 1349   CO2 26 07/18/2015 1015   BUN 11.1 08/24/2015 1349   BUN 11 07/18/2015 1015   CREATININE 1.0 08/24/2015 1349   CREATININE 0.79 07/18/2015 1015   CREATININE 0.81 01/14/2014 1413      Component Value Date/Time   CALCIUM 9.1 08/24/2015 1349   CALCIUM 8.8 07/18/2015 1015   ALKPHOS 60 08/24/2015 1349   ALKPHOS 54 07/18/2015 1015   AST 17 08/24/2015 1349   AST 18 07/18/2015 1015   ALT 25 08/24/2015 1349   ALT 19 07/18/2015 1015   BILITOT 1.20 08/24/2015 1349   BILITOT 1.2 07/18/2015 1015     Other lab results: Beta-2 microglobulin 2.23, free kappa Light chain 1.36, free lambda light chain 0.32 with a kappa/lambda ratio 1.74. IgG 364, IgA 1890 and IgM 19.  RADIOGRAPHIC STUDIES: Mm Digital Screening Bilateral  08/09/2015   CLINICAL DATA:  Screening.  EXAM: DIGITAL SCREENING BILATERAL MAMMOGRAM WITH CAD  COMPARISON:  Previous exam(s).  ACR Breast Density Category b: There are scattered areas of fibroglandular density.  FINDINGS: There are no findings suspicious for malignancy. Images were processed with CAD.  IMPRESSION: No mammographic evidence of malignancy. A result letter of this screening mammogram will be mailed directly to the patient.  RECOMMENDATION: Screening mammogram in one year. (Code:[id number])  BI-RADS CATEGORY  1: Negative.   Electronically Signed   By: Lovey Newcomer M.D.   On: 08/09/2015 10:27    ASSESSMENT AND PLAN: This is a very pleasant  64 years old [place] American female with history of multiple myeloma currently on treatment with Revlimid and low-dose Decadron and tolerating her treatment fairly well. The patient is doing fine today with no specific complaints except for mild fatigue.  Because of the recent disease progression, Dr. [name] recommended for her to continue her  treatment with Revlimid and Decadron but I increased the dose of Revlimid to 25 mg by mouth daily for 21 days every 4 weeks, status post 3 months of this regimen. The patient's recent myeloma panel showed no evidence for disease progression. The patient was discussed with and also seen by Dr. [name]. She will continue with her current treatment with Revlimid and Decadron. The patient will continue her treatment with Zometa every 3 months as a scheduled, next due in November 2016. She will come back for follow-up visit in 4 weeks for reevaluation with blood work. She was advised to call immediately if she has any concerning symptoms and interval. The patient voices understanding of current disease status and treatment options and is in agreement with the current care plan.  All questions were answered. The patient knows to call the clinic with any problems, questions or concerns. We can certainly see the patient much sooner if necessary.  Carlton Adam PA-C   ADDENDUM: Hematology/Oncology Attending: I had a face to face encounter with the patient. I recommended her care plan. This is a very pleasant 64 years old African-American female with multiple myeloma currently undergoing treatment with Revlimid and Decadron. She is tolerating her treatment well with no specific complaints. I recommended for the patient to continue her treatment with the same regimen for now. She will continue on Zometa every 3 months. The patient would come back for follow-up visit in 4 weeks for reevaluation and repeat blood work. She was advised to call immediately if she has  any concerning symptoms in the interval.  Disclaimer: This note was dictated with voice recognition software. Similar sounding words can inadvertently be transcribed and may be missed upon review.  Eilleen Kempf., MD 08/31/2015

## 2015-09-13 ENCOUNTER — Other Ambulatory Visit: Payer: Self-pay | Admitting: *Deleted

## 2015-09-13 DIAGNOSIS — C9000 Multiple myeloma not having achieved remission: Secondary | ICD-10-CM

## 2015-09-13 MED ORDER — LENALIDOMIDE 25 MG PO CAPS: 25.0000 mg | ORAL_CAPSULE | Freq: Every day | ORAL | Status: DC

## 2015-09-13 MED ORDER — LENALIDOMIDE 25 MG PO CAPS
25.0000 mg | ORAL_CAPSULE | Freq: Every day | ORAL | Status: DC
Start: 2015-09-13 — End: 2015-10-07

## 2015-09-13 NOTE — Telephone Encounter (Signed)
revlimid refill sent to [facility] for auth

## 2015-09-13 NOTE — Telephone Encounter (Signed)
PT. IS REQUESTING A REFILL ON HER REVLIMID TO BE SENT TO [facility]. SHE NEEDS TO START THE MEDICATION THIS Friday,9/16.16.

## 2015-09-13 NOTE — Addendum Note (Signed)
Addended by: Ardeen Garland on: 09/13/2015 03:11 PM   Modules accepted: Orders

## 2015-09-20 DIAGNOSIS — B351 Tinea unguium: Secondary | ICD-10-CM | POA: Diagnosis not present

## 2015-09-20 DIAGNOSIS — E1165 Type 2 diabetes mellitus with hyperglycemia: Secondary | ICD-10-CM | POA: Diagnosis not present

## 2015-09-26 ENCOUNTER — Telehealth: Payer: Self-pay | Admitting: Internal Medicine

## 2015-09-26 ENCOUNTER — Encounter: Payer: Self-pay | Admitting: Internal Medicine

## 2015-09-26 ENCOUNTER — Ambulatory Visit (HOSPITAL_BASED_OUTPATIENT_CLINIC_OR_DEPARTMENT_OTHER): Payer: Medicare Other

## 2015-09-26 ENCOUNTER — Ambulatory Visit (HOSPITAL_BASED_OUTPATIENT_CLINIC_OR_DEPARTMENT_OTHER): Payer: Medicare Other | Admitting: Internal Medicine

## 2015-09-26 ENCOUNTER — Other Ambulatory Visit (HOSPITAL_BASED_OUTPATIENT_CLINIC_OR_DEPARTMENT_OTHER): Payer: Medicare Other

## 2015-09-26 DIAGNOSIS — Z23 Encounter for immunization: Secondary | ICD-10-CM | POA: Diagnosis present

## 2015-09-26 DIAGNOSIS — C9000 Multiple myeloma not having achieved remission: Secondary | ICD-10-CM

## 2015-09-26 LAB — CBC WITH DIFFERENTIAL/PLATELET
BASO%: 0.5 % (ref 0.0–2.0)
BASOS ABS: 0.1 10*3/uL (ref 0.0–0.1)
EOS ABS: 0.2 10*3/uL (ref 0.0–0.5)
EOS%: 2.3 % (ref 0.0–7.0)
HEMATOCRIT: 32.4 % — AB (ref 34.8–46.6)
HEMOGLOBIN: 10.1 g/dL — AB (ref 11.6–15.9)
LYMPH#: 0.8 10*3/uL — AB (ref 0.9–3.3)
LYMPH%: 8.4 % — ABNORMAL LOW (ref 14.0–49.7)
MCH: 30.1 pg (ref 25.1–34.0)
MCHC: 31.2 g/dL — ABNORMAL LOW (ref 31.5–36.0)
MCV: 96.7 fL (ref 79.5–101.0)
MONO#: 1.1 10*3/uL — AB (ref 0.1–0.9)
MONO%: 11.5 % (ref 0.0–14.0)
NEUT#: 7.2 10*3/uL — ABNORMAL HIGH (ref 1.5–6.5)
NEUT%: 77.3 % — AB (ref 38.4–76.8)
NRBC: 3 % — AB (ref 0–0)
PLATELETS: 136 10*3/uL — AB (ref 145–400)
RBC: 3.35 10*6/uL — ABNORMAL LOW (ref 3.70–5.45)
RDW: 17.3 % — ABNORMAL HIGH (ref 11.2–14.5)
WBC: 9.3 10*3/uL (ref 3.9–10.3)

## 2015-09-26 LAB — COMPREHENSIVE METABOLIC PANEL (CC13)
ALBUMIN: 3.1 g/dL — AB (ref 3.5–5.0)
ALK PHOS: 64 U/L (ref 40–150)
ALT: 25 U/L (ref 0–55)
AST: 19 U/L (ref 5–34)
Anion Gap: 8 mEq/L (ref 3–11)
BILIRUBIN TOTAL: 1.73 mg/dL — AB (ref 0.20–1.20)
BUN: 8.8 mg/dL (ref 7.0–26.0)
CALCIUM: 9.3 mg/dL (ref 8.4–10.4)
CO2: 28 mEq/L (ref 22–29)
Chloride: 105 mEq/L (ref 98–109)
Creatinine: 0.9 mg/dL (ref 0.6–1.1)
EGFR: 79 mL/min/{1.73_m2} — AB (ref >90)
Glucose: 131 mg/dl (ref 70–140)
Potassium: 3.7 mEq/L (ref 3.5–5.1)
Sodium: 142 mEq/L (ref 136–145)
TOTAL PROTEIN: 6.8 g/dL (ref 6.4–8.3)

## 2015-09-26 LAB — LACTATE DEHYDROGENASE (CC13): LDH: 216 U/L (ref 125–245)

## 2015-09-26 LAB — TECHNOLOGIST REVIEW

## 2015-09-26 MED ORDER — INFLUENZA VAC SPLIT QUAD 0.5 ML IM SUSY
0.5000 mL | PREFILLED_SYRINGE | Freq: Once | INTRAMUSCULAR | Status: AC
  Administered 2015-09-26: 0.5 mL via INTRAMUSCULAR
  Filled 2015-09-26: qty 0.5

## 2015-09-26 NOTE — Telephone Encounter (Signed)
Gave patient avs report and appointments for October and November.

## 2015-09-26 NOTE — Progress Notes (Signed)
[place] Telephone:(336) [phone]   Fax:(336) [phone]  OFFICE PROGRESS NOTE  [name], MD [address] [place] [address]  DIAGNOSIS: Stage IIA IgA kappa multiple myeloma diagnosed in 2006   PRIOR THERAPY:  1) status post treatment with thalidomide and Decadron followed by autologous peripheral blood stem cell transplant with high-dose melphalan on 04/26/2006 at [facility].  2) the patient had evidence for disease recurrence in August of 2008 and she was treated with 6 cycles of Velcade completed on 03/30/2008 with response to the treatment but was discontinued secondary to neuropathy.  3) maintenance treatment with Revlimid 10 mg by mouth daily by the does was later increase to 15 mg by mouth daily secondary to biochemical recurrence with stabilization of her disease. The treatment with Revlimid 15 mg by mouth daily was discontinued secondary to evidence for disease progression.  CURRENT THERAPY:  1) Revlimid 25 mg by mouth daily for 3 weeks every 4 weeks in addition to Decadron currently at 20 mg by mouth on a weekly basis. She is status post 5 cycles. 2) Zometa 4 mg IV every 3 months. Next dose in 05/05/2015  ADVANCED DIRECTIVE: She does not have advanced directive and was given information.  INTERVAL HISTORY: Darlene Howard 64 y.o. female returns to the clinic today for routine monthly followup visit. She is feeling fine today with no specific complaints except for mild fatigue. She took few tablets of ibuprofen recently. She is tolerating her treatment with Revlimid and Decadron fairly well with no significant adverse effects. She status post 5 cycles. The patient denied having any fever or chills. She denied having any chest pain, shortness of breath, cough or hemoptysis. She has no weight loss or night sweats. She is here today for evaluation and repeat blood work. She is requesting flu shot.  MEDICAL HISTORY: Past Medical History   Diagnosis Date  . Glaucoma     uses eye drops as instructed  . Personal history of colon cancer     ASCENDING COLON--  S/P  RIGHT HEMICOLECTOMY WITH NEGATIVE NODES/    NO RECURRENCE  . Herpes     takes Valtrex as needed  . GERD (gastroesophageal reflux disease)     takes Pantoprazole daily  . Hypertension     takes Amlodipine and Diovan daily  . Bruises easily     d/t being on COumadin  . History of colon polyps   . Type 2 diabetes mellitus   . Neuropathy associated with multiple myeloma     POST CHEMOTHERAPY AND STEM CELL TRANSPLANT  . H/O stem cell transplant     04-26-2006  AT [facility]  . Multiple myeloma DX  2006  STATE IIA,  IgA  KAPPA    STABLE  PER DR MOHAMED--  S/P STEM CELL TRANSPLANT 2007 /  RECURRENCE 2008  CHEMO ENDED 03-30-2008/   NOW ON MAINTANANCE REVLIMID )  . Vocal cord nodule     ASSESSED BY DR TEOH--  LMA  OK W/ PROCEDURE'S  . DDD (degenerative disc disease), lumbar   . Cholelithiasis   . Fatty liver     ALLERGIES:  is allergic to codeine.  MEDICATIONS:  Current Outpatient Prescriptions  Medication Sig Dispense Refill  . amLODipine (NORVASC) 2.5 MG tablet Take 1 tablet by mouth  daily in the morning 90 tablet 1  . aspirin (ASPIRIN LOW DOSE) 81 MG EC tablet Take 81 mg by mouth daily.      Marland Kitchen  bimatoprost (LUMIGAN) 0.03 % ophthalmic drops Place 1 drop into both eyes at bedtime.     . calcium-vitamin D (OSCAL 500/200 D-3) 500-200 MG-UNIT per tablet Take 1 tablet by mouth 3 (three) times daily.      . cholecalciferol (VITAMIN D) 1000 UNITS tablet Take 1,000 Units by mouth daily.    Marland Kitchen conjugated estrogens (PREMARIN) vaginal cream Place 1 Applicatorful vaginally 2 (two) times a week. 42.5 g 12  . dexamethasone (DECADRON) 4 MG tablet Take 5 tablets by mouth on  a weekly basis as directed 60 tablet 1  . dorzolamide-timolol (COSOPT) 22.3-6.8 MG/ML ophthalmic solution Place 1 drop into both eyes 2 (two) times daily.    . DUREZOL 0.05 % EMUL     . gabapentin (NEURONTIN)  100 MG capsule Take 1 capsule by mouth 3  times daily 270 capsule 0  . lenalidomide (REVLIMID) 25 MG capsule Take 1 capsule (25 mg total) by mouth daily. take 25 mg every day for 21 days every 4 weeks. 21 capsule 0  . loratadine (CLARITIN) 10 MG tablet TAKE ONE TABLET BY MOUTH ONCE DAILY. 30 tablet 3  . metFORMIN (GLUCOPHAGE) 1000 MG tablet Take 1 tablet by mouth  daily with breakfast 90 tablet 1  . Multiple Vitamins-Minerals (CENTRUM SILVER PO) Take 1 tablet by mouth daily.     . ONE TOUCH ULTRA TEST test strip USE AS DIRECTED TO CHECK DAILY. 50 each 3  . pantoprazole (PROTONIX) 40 MG tablet Take 1 tablet by mouth  daily for acid reflux 90 tablet 1  . potassium chloride SA (K-DUR,KLOR-CON) 20 MEQ tablet Take 1 tablet by mouth  twice a day 180 tablet 1  . valACYclovir (VALTREX) 1000 MG tablet Take 1 tablet by mouth  daily as needed 90 tablet 1  . valsartan-hydrochlorothiazide (DIOVAN-HCT) 320-25 MG per tablet Take 1 tablet by mouth  daily 90 tablet 1  . warfarin (COUMADIN) 2 MG tablet Take 1 tablet by mouth  daily 90 tablet 0   No current facility-administered medications for this visit.    SURGICAL HISTORY:  Past Surgical History  Procedure Laterality Date  . Colonoscopy  10/07/2006    [id number] rectum/Status post right hemicolectomy. Normal residual colon  . Cervical cone biopsy    . Cataract extraction w/phaco Left 01/20/2014    Procedure: CATARACT EXTRACTION PHACO AND INTRAOCULAR LENS PLACEMENT (IOC);  Surgeon: Adonis Brook, MD;  Location: [facility];  Service: Ophthalmology;  Laterality: Left;  . 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 Co2  ablation vulva perianal and vaginal dysplatic areas  02-15-2010  . Abdominal hysterectomy  1983    w/  appendectomy and left salpingoophorectom  . Hemicolectomy Right 04-27-2005    CARCINOMA   ASCENDING COLON  . Vulvectomy N/A 05/06/2014    Procedure: WIDE LOCAL EXCISION LEFT LABIA MAJORA;  Surgeon: Janie Morning, MD;  Location: [facility];  Service: Gynecology;  Laterality: N/A;  . Vulvar lesion removal N/A 05/06/2014    Procedure: LASER OF VULVAR LESION;  Surgeon: Janie Morning, MD;  Location: [facility];  Service: Gynecology;  Laterality: N/A;  . Lesion removal N/A 05/06/2014    Procedure: LASER OF THE VAGINA;  Surgeon: Janie Morning, MD;  Location: [facility];  Service: Gynecology;  Laterality: N/A;  . Esophagogastroduodenoscopy  04/02/2005    [id number] polyp with central erosion versus ulceration in the body of the stomach, resected and recovered Remainder of the gastric mucosa, D1, D2  appeared normal  . Colonoscopy  10/2009    RMR: diminutive RS polyp (hyperplastic).   . Colonoscopy N/A 12/15/2014    Procedure: COLONOSCOPY;  Surgeon: Daneil Dolin, MD;  [facility];  Service: Endoscopy;  Laterality: N/A;  130     REVIEW OF SYSTEMS:  Constitutional: positive for fatigue Eyes: negative Ears, nose, mouth, throat, and face: negative Respiratory: negative Cardiovascular: negative Gastrointestinal: negative Genitourinary:negative Integument/breast: negative Hematologic/lymphatic: negative Musculoskeletal:negative Neurological: negative Behavioral/Psych: negative Endocrine: negative Allergic/Immunologic: negative   PHYSICAL EXAMINATION: General appearance: alert, cooperative and no distress Head: Normocephalic, without obvious abnormality, atraumatic Neck: no adenopathy Lymph nodes: Cervical, supraclavicular, and axillary nodes normal. Resp: clear to auscultation bilaterally Back: symmetric, no curvature. ROM normal. No CVA tenderness. Cardio: regular rate and rhythm, S1, S2 normal, no murmur, click, rub or gallop GI: soft, non-tender; bowel sounds normal; no masses,  no organomegaly Extremities: extremities normal, atraumatic, no cyanosis or edema Neurologic: Alert and oriented X 3, normal strength and tone. Normal symmetric reflexes.  Normal coordination and gait  ECOG PERFORMANCE STATUS: 1 - Symptomatic but completely ambulatory  There were no vitals taken for this visit.  LABORATORY DATA: Lab Results  Component Value Date   WBC 9.3 09/26/2015   HGB 10.1* 09/26/2015   HCT 32.4* 09/26/2015   MCV 96.7 09/26/2015   PLT 136* 09/26/2015      Chemistry      Component Value Date/Time   NA 142 09/26/2015 1334   NA 142 07/18/2015 1015   K 3.7 09/26/2015 1334   K 3.9 07/18/2015 1015   CL 103 07/18/2015 1015   CL 105 06/17/2013 1441   CO2 28 09/26/2015 1334   CO2 26 07/18/2015 1015   BUN 8.8 09/26/2015 1334   BUN 11 07/18/2015 1015   CREATININE 0.9 09/26/2015 1334   CREATININE 0.79 07/18/2015 1015   CREATININE 0.81 01/14/2014 1413      Component Value Date/Time   CALCIUM 9.3 09/26/2015 1334   CALCIUM 8.8 07/18/2015 1015   ALKPHOS 64 09/26/2015 1334   ALKPHOS 54 07/18/2015 1015   AST 19 09/26/2015 1334   AST 18 07/18/2015 1015   ALT 25 09/26/2015 1334   ALT 19 07/18/2015 1015   BILITOT 1.73* 09/26/2015 1334   BILITOT 1.2 07/18/2015 1015     Other lab results: Beta-2 microglobulin 2.23, free kappa Light chain 1.36, free lambda light chain 0.32 with a kappa/lambda ratio 1.74. IgG 364, IgA 1890 and IgM 19.  RADIOGRAPHIC STUDIES: No results found.  ASSESSMENT AND PLAN: This is a very pleasant 64 years old [place] American female with history of multiple myeloma currently on treatment with Revlimid and low-dose Decadron and tolerating her treatment fairly well. The patient is doing fine today with no specific complaints except for mild fatigue.  Because of disease progression, I recommended for her to continue her treatment with Revlimid and Decadron but I increased the dose of Revlimid to 25 mg by mouth daily for 21 days every 4 weeks, status post 5 months of this regimen. Her lab work today showed no significant change except for elevated serum bilirubin. I discussed the lab result with the patient  recommended for her to continue her treatment as scheduled. We will repeat CBC, complaints metabolic panel, LDH and myeloma panel in 2 weeks for reevaluation of her disease and close monitoring of the serum bilirubin. The patient will continue her treatment with Zometa every 3 months as a scheduled. The patient will receive flu shot today. She  will come back for follow-up visit in 3 weeks for reevaluation. She was advised to call immediately if she has any concerning symptoms and interval. The patient voices understanding of current disease status and treatment options and is in agreement with the current care plan.  All questions were answered. The patient knows to call the clinic with any problems, questions or concerns. We can certainly see the patient much sooner if necessary.  Disclaimer: This note was dictated with voice recognition software. Similar sounding words can inadvertently be transcribed and may not be corrected upon review.

## 2015-10-07 ENCOUNTER — Other Ambulatory Visit: Payer: Self-pay | Admitting: Internal Medicine

## 2015-10-07 NOTE — Telephone Encounter (Signed)
Can you get auth number

## 2015-10-10 ENCOUNTER — Other Ambulatory Visit: Payer: Self-pay | Admitting: *Deleted

## 2015-10-10 NOTE — Telephone Encounter (Signed)
Authorization # [id number] [facility].

## 2015-10-10 NOTE — Telephone Encounter (Signed)
Survey completed, Medication refill faxed to [facility] [phone]

## 2015-10-13 ENCOUNTER — Other Ambulatory Visit (HOSPITAL_BASED_OUTPATIENT_CLINIC_OR_DEPARTMENT_OTHER): Payer: Medicare Other

## 2015-10-13 DIAGNOSIS — C9000 Multiple myeloma not having achieved remission: Secondary | ICD-10-CM | POA: Diagnosis not present

## 2015-10-13 LAB — COMPREHENSIVE METABOLIC PANEL (CC13)
ALK PHOS: 63 U/L (ref 40–150)
ALT: 25 U/L (ref 0–55)
ANION GAP: 6 meq/L (ref 3–11)
AST: 20 U/L (ref 5–34)
Albumin: 3.2 g/dL — ABNORMAL LOW (ref 3.5–5.0)
BILIRUBIN TOTAL: 1.39 mg/dL — AB (ref 0.20–1.20)
BUN: 10.2 mg/dL (ref 7.0–26.0)
CALCIUM: 9.6 mg/dL (ref 8.4–10.4)
CO2: 28 mEq/L (ref 22–29)
CREATININE: 0.9 mg/dL (ref 0.6–1.1)
Chloride: 106 mEq/L (ref 98–109)
EGFR: 74 mL/min/{1.73_m2} — ABNORMAL LOW (ref >90)
Glucose: 120 mg/dl (ref 70–140)
Potassium: 3.6 mEq/L (ref 3.5–5.1)
Sodium: 140 mEq/L (ref 136–145)
TOTAL PROTEIN: 7.2 g/dL (ref 6.4–8.3)

## 2015-10-13 LAB — CBC WITH DIFFERENTIAL/PLATELET
BASO%: 1 % (ref 0.0–2.0)
Basophils Absolute: 0.1 10*3/uL (ref 0.0–0.1)
EOS%: 2 % (ref 0.0–7.0)
Eosinophils Absolute: 0.1 10*3/uL (ref 0.0–0.5)
HEMATOCRIT: 31 % — AB (ref 34.8–46.6)
HGB: 9.8 g/dL — ABNORMAL LOW (ref 11.6–15.9)
LYMPH#: 1 10*3/uL (ref 0.9–3.3)
LYMPH%: 15 % (ref 14.0–49.7)
MCH: 30.5 pg (ref 25.1–34.0)
MCHC: 31.6 g/dL (ref 31.5–36.0)
MCV: 96.6 fL (ref 79.5–101.0)
MONO#: 0.9 10*3/uL (ref 0.1–0.9)
MONO%: 13.6 % (ref 0.0–14.0)
NEUT%: 68.4 % (ref 38.4–76.8)
NEUTROS ABS: 4.7 10*3/uL (ref 1.5–6.5)
PLATELETS: 129 10*3/uL — AB (ref 145–400)
RBC: 3.21 10*6/uL — AB (ref 3.70–5.45)
RDW: 16.7 % — ABNORMAL HIGH (ref 11.2–14.5)
WBC: 6.9 10*3/uL (ref 3.9–10.3)

## 2015-10-13 LAB — LACTATE DEHYDROGENASE (CC13): LDH: 161 U/L (ref 125–245)

## 2015-10-19 LAB — BETA 2 MICROGLOBULIN, SERUM: Beta-2 Microglobulin: 3.24 mg/L — ABNORMAL HIGH (ref <=2.51)

## 2015-10-19 LAB — IGG, IGA, IGM
IGA: 1890 mg/dL — AB (ref 69–380)
IGM, SERUM: 32 mg/dL — AB (ref 52–322)
IgG (Immunoglobin G), Serum: 306 mg/dL — ABNORMAL LOW (ref 690–1700)

## 2015-10-19 LAB — KAPPA/LAMBDA LIGHT CHAINS
KAPPA FREE LGHT CHN: 1.25 mg/dL (ref 0.33–1.94)
Kappa:Lambda Ratio: 4.17 — ABNORMAL HIGH (ref 0.26–1.65)
Lambda Free Lght Chn: 0.3 mg/dL — ABNORMAL LOW (ref 0.57–2.63)

## 2015-10-20 ENCOUNTER — Encounter: Payer: Self-pay | Admitting: Internal Medicine

## 2015-10-20 ENCOUNTER — Ambulatory Visit (HOSPITAL_BASED_OUTPATIENT_CLINIC_OR_DEPARTMENT_OTHER): Payer: Medicare Other | Admitting: Internal Medicine

## 2015-10-20 ENCOUNTER — Telehealth: Payer: Self-pay | Admitting: Internal Medicine

## 2015-10-20 VITALS — BP 125/54 | HR 83 | Temp 98.3°F | Resp 18 | Ht 66.0 in | Wt 199.7 lb

## 2015-10-20 DIAGNOSIS — R5383 Other fatigue: Secondary | ICD-10-CM

## 2015-10-20 DIAGNOSIS — C9000 Multiple myeloma not having achieved remission: Secondary | ICD-10-CM | POA: Diagnosis not present

## 2015-10-20 NOTE — Progress Notes (Signed)
[place] [phone]   [phone]  OFFICE PROGRESS NOTE  [name], MD [address], [address] [place] [address]  DIAGNOSIS: Stage IIA IgA kappa multiple myeloma diagnosed in 2006   PRIOR THERAPY:  1) status post treatment with thalidomide and Decadron followed by autologous peripheral blood stem cell transplant with high-dose melphalan on 04/26/2006 at [facility].  2) the patient had evidence for disease recurrence in August of 2008 and she was treated with 6 cycles of Velcade completed on 03/30/2008 with response to the treatment but was discontinued secondary to neuropathy.  3) maintenance treatment with Revlimid 10 mg by mouth daily by the does was later increase to 15 mg by mouth daily secondary to biochemical recurrence with stabilization of her disease. The treatment with Revlimid 15 mg by mouth daily was discontinued secondary to evidence for disease progression.  CURRENT THERAPY:  1) Revlimid 25 mg by mouth daily for 3 weeks every 4 weeks in addition to Decadron currently at 20 mg by mouth on a weekly basis. She is status post 5 cycles. 2) Zometa 4 mg IV every 3 months. Next dose in 05/05/2015  ADVANCED DIRECTIVE: She does not have advanced directive and was given information.  INTERVAL HISTORY: Darlene Howard 64 y.o. female returns to the clinic today for routine monthly followup visit. She is feeling fine today with no specific complaints except for mild fatigue. She is tolerating her treatment with Revlimid and Decadron fairly well with no significant adverse effects. She status post 6 cycles. The patient denied having any fever or chills. She denied having any chest pain, shortness of breath, cough or hemoptysis. She has no weight loss or night sweats. She had a myeloma panel performed recently and she is here for evaluation and discussion of her lab results.  MEDICAL HISTORY: Past Medical History  Diagnosis Date    . Glaucoma     uses eye drops as instructed  . Personal history of colon cancer     ASCENDING COLON--  S/P  RIGHT HEMICOLECTOMY WITH NEGATIVE NODES/    NO RECURRENCE  . Herpes     takes Valtrex as needed  . GERD (gastroesophageal reflux disease)     takes Pantoprazole daily  . Hypertension     takes Amlodipine and Diovan daily  . Bruises easily     d/t being on COumadin  . History of colon polyps   . Type 2 diabetes mellitus ([place])   . 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 H/O stem cell transplant ([place])     04-26-2006  AT [facility]  . Multiple myeloma DX  2006  STATE IIA,  IgA  KAPPA    STABLE  PER DR Chen Holzman--  S/P STEM CELL TRANSPLANT 2007 /  RECURRENCE 2008  CHEMO ENDED 03-30-2008/   NOW ON MAINTANANCE REVLIMID )  . Vocal cord nodule     ASSESSED BY DR TEOH--  LMA  OK W/ PROCEDURE'S  . DDD (degenerative disc disease), lumbar   . Cholelithiasis   . Fatty liver     ALLERGIES:  is allergic to codeine.  MEDICATIONS:  Current Outpatient Prescriptions  Medication Sig Dispense Refill  . amLODipine (NORVASC) 2.5 MG tablet Take 1 tablet by mouth  daily in the morning 90 tablet 1  . aspirin (ASPIRIN LOW DOSE) 81 MG EC tablet Take 81 mg by mouth daily.      Marland Kitchen  bimatoprost (LUMIGAN) 0.03 % ophthalmic drops Place 1 drop into both eyes at bedtime.     . calcium-vitamin D (OSCAL 500/200 D-3) 500-200 MG-UNIT per tablet Take 1 tablet by mouth 3 (three) times daily.      . cholecalciferol (VITAMIN D) 1000 UNITS tablet Take 1,000 Units by mouth daily.    Marland Kitchen conjugated estrogens (PREMARIN) vaginal cream Place 1 Applicatorful vaginally 2 (two) times a week. 42.5 g 12  . dexamethasone (DECADRON) 4 MG tablet Take 5 tablets by mouth on  a weekly basis as directed 60 tablet 1  . gabapentin (NEURONTIN) 100 MG capsule Take 1 capsule by mouth 3  times daily 270 capsule 0  . loratadine (CLARITIN) 10 MG tablet TAKE ONE TABLET BY MOUTH ONCE DAILY.  30 tablet 3  . metFORMIN (GLUCOPHAGE) 1000 MG tablet Take 1 tablet by mouth  daily with breakfast 90 tablet 1  . Multiple Vitamins-Minerals (CENTRUM SILVER PO) Take 1 tablet by mouth daily.     . ONE TOUCH ULTRA TEST test strip USE AS DIRECTED TO CHECK DAILY. 50 each 3  . pantoprazole (PROTONIX) 40 MG tablet Take 1 tablet by mouth  daily for acid reflux 90 tablet 1  . potassium chloride SA (K-DUR,KLOR-CON) 20 MEQ tablet Take 1 tablet by mouth  twice a day 180 tablet 1  . REVLIMID 25 MG capsule TAKE 25 MG EVERY DAY FOR 21 DAYS EVERY 4 WEEKS. 21 capsule 0  . valACYclovir (VALTREX) 1000 MG tablet Take 1 tablet by mouth  daily as needed 90 tablet 1  . valsartan-hydrochlorothiazide (DIOVAN-HCT) 320-25 MG per tablet Take 1 tablet by mouth  daily 90 tablet 1  . warfarin (COUMADIN) 2 MG tablet Take 1 tablet by mouth  daily 90 tablet 0   No current facility-administered medications for this visit.    SURGICAL HISTORY:  Past Surgical History  Procedure Laterality Date  . Colonoscopy  10/07/2006    [id number] rectum/Status post right hemicolectomy. Normal residual colon  . Cervical cone biopsy    . Cataract extraction w/phaco Left 01/20/2014    Procedure: CATARACT EXTRACTION PHACO AND INTRAOCULAR LENS PLACEMENT (IOC);  Surgeon: Adonis Brook, MD;  Location: [facility];  Service: Ophthalmology;  Laterality: Left;  . 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 Co2  ablation vulva perianal and vaginal dysplatic areas  02-15-2010  . Abdominal hysterectomy  1983    w/  appendectomy and left salpingoophorectom  . Hemicolectomy Right 04-27-2005    CARCINOMA   ASCENDING COLON  . Vulvectomy N/A 05/06/2014    Procedure: WIDE LOCAL EXCISION LEFT LABIA MAJORA;  Surgeon: Janie Morning, MD;  Location: [facility];  Service: Gynecology;  Laterality: N/A;  . Vulvar lesion removal N/A 05/06/2014    Procedure: LASER OF VULVAR LESION;  Surgeon: Janie Morning, MD;  Location: [facility];  Service: Gynecology;  Laterality: N/A;  . Lesion removal N/A 05/06/2014    Procedure: LASER OF THE VAGINA;  Surgeon: Janie Morning, MD;  Location: [facility];  Service: Gynecology;  Laterality: N/A;  . Esophagogastroduodenoscopy  04/02/2005    [id number] polyp with central erosion versus ulceration in the body of the stomach, resected and recovered Remainder of the gastric mucosa, D1, D2 appeared normal  . Colonoscopy  10/2009    RMR: diminutive RS polyp (hyperplastic).   . Colonoscopy N/A 12/15/2014    Procedure: COLONOSCOPY;  Surgeon: Daneil Dolin, MD;  [facility];  Service:  Endoscopy;  Laterality: N/A;  130     REVIEW OF SYSTEMS:  Constitutional: positive for fatigue Eyes: negative Ears, nose, mouth, throat, and face: negative Respiratory: negative Cardiovascular: negative Gastrointestinal: negative Genitourinary:negative Integument/breast: negative Hematologic/lymphatic: negative Musculoskeletal:negative Neurological: negative Behavioral/Psych: negative Endocrine: negative Allergic/Immunologic: negative   PHYSICAL EXAMINATION: General appearance: alert, cooperative and no distress Head: Normocephalic, without obvious abnormality, atraumatic Neck: no adenopathy Lymph nodes: Cervical, supraclavicular, and axillary nodes normal. Resp: clear to auscultation bilaterally Back: symmetric, no curvature. ROM normal. No CVA tenderness. Cardio: regular rate and rhythm, S1, S2 normal, no murmur, click, rub or gallop GI: soft, non-tender; bowel sounds normal; no masses,  no organomegaly Extremities: extremities normal, atraumatic, no cyanosis or edema Neurologic: Alert and oriented X 3, normal strength and tone. Normal symmetric reflexes. Normal coordination and gait  ECOG PERFORMANCE STATUS: 1 - Symptomatic but completely ambulatory  Blood pressure 125/54, pulse 83, temperature 98.3 F (36.8 C), temperature source Oral, resp. rate  18, height $RemoveBe'5\' 6"'CnQeuKkRb$  (1.676 m), weight 199 lb 11.2 oz (90.583 kg), SpO2 99 %.  LABORATORY DATA: Lab Results  Component Value Date   WBC 6.9 10/13/2015   HGB 9.8* 10/13/2015   HCT 31.0* 10/13/2015   MCV 96.6 10/13/2015   PLT 129* 10/13/2015      Chemistry      Component Value Date/Time   NA 140 10/13/2015 1337   NA 142 07/18/2015 1015   K 3.6 10/13/2015 1337   K 3.9 07/18/2015 1015   CL 103 07/18/2015 1015   CL 105 06/17/2013 1441   CO2 28 10/13/2015 1337   CO2 26 07/18/2015 1015   BUN 10.2 10/13/2015 1337   BUN 11 07/18/2015 1015   CREATININE 0.9 10/13/2015 1337   CREATININE 0.79 07/18/2015 1015   CREATININE 0.81 01/14/2014 1413      Component Value Date/Time   CALCIUM 9.6 10/13/2015 1337   CALCIUM 8.8 07/18/2015 1015   ALKPHOS 63 10/13/2015 1337   ALKPHOS 54 07/18/2015 1015   AST 20 10/13/2015 1337   AST 18 07/18/2015 1015   ALT 25 10/13/2015 1337   ALT 19 07/18/2015 1015   BILITOT 1.39* 10/13/2015 1337   BILITOT 1.2 07/18/2015 1015     Other lab results: Beta-2 microglobulin 3.24, free kappa Light chain 1.25, free lambda light chain 0.30 with a kappa/lambda ratio 4.17. IgG 306, IgA 1890 and IgM 32.  RADIOGRAPHIC STUDIES: No results found.  ASSESSMENT AND PLAN: This is a very pleasant 64 years old [place] American female with history of multiple myeloma currently on treatment with Revlimid and low-dose Decadron and tolerated her treatment fairly well. The patient is doing fine today with no specific complaints except for mild fatigue.  Because of disease progression, I recommended for her to continue her treatment with Revlimid and Decadron but I increased the dose of Revlimid to 25 mg by mouth daily for 21 days every 4 weeks, status post 6 months of this regimen. Her myeloma panel showed stable disease. I discussed the lab result with the patient and recommended for her to continue her treatment as scheduled.  Her serum bilirubin improved compared to 2 weeks ago and  we will continue to monitor it closely. The patient will continue her treatment with Zometa every 3 months as a scheduled. She will come back for follow-up visit in 4 weeks for reevaluation. She was advised to call immediately if she has any concerning symptoms and interval. The patient voices understanding of current disease status and treatment options and  is in agreement with the current care plan.  All questions were answered. The patient knows to call the clinic with any problems, questions or concerns. We can certainly see the patient much sooner if necessary.  Disclaimer: This note was dictated with voice recognition software. Similar sounding words can inadvertently be transcribed and may not be corrected upon review.

## 2015-10-20 NOTE — Telephone Encounter (Signed)
Gave adn printed appt scehd and avs for pt for NOV and Jan

## 2015-10-27 ENCOUNTER — Other Ambulatory Visit: Payer: Self-pay | Admitting: Internal Medicine

## 2015-10-28 DIAGNOSIS — H401131 Primary open-angle glaucoma, bilateral, mild stage: Secondary | ICD-10-CM | POA: Diagnosis not present

## 2015-11-04 ENCOUNTER — Ambulatory Visit (HOSPITAL_BASED_OUTPATIENT_CLINIC_OR_DEPARTMENT_OTHER): Payer: Medicare Other

## 2015-11-04 VITALS — BP 135/51 | HR 87 | Temp 98.0°F | Resp 20

## 2015-11-04 DIAGNOSIS — C9000 Multiple myeloma not having achieved remission: Secondary | ICD-10-CM | POA: Diagnosis present

## 2015-11-04 MED ORDER — ZOLEDRONIC ACID 4 MG/100ML IV SOLN
4.0000 mg | Freq: Once | INTRAVENOUS | Status: AC
Start: 2015-11-04 — End: 2015-11-04
  Administered 2015-11-04: 4 mg via INTRAVENOUS
  Filled 2015-11-04: qty 100

## 2015-11-04 MED ORDER — SODIUM CHLORIDE 0.9 % IV SOLN
Freq: Once | INTRAVENOUS | Status: AC
  Administered 2015-11-04: 14:00:00 via INTRAVENOUS

## 2015-11-04 NOTE — Patient Instructions (Signed)

## 2015-11-07 ENCOUNTER — Telehealth: Payer: Self-pay | Admitting: *Deleted

## 2015-11-07 ENCOUNTER — Other Ambulatory Visit: Payer: Self-pay | Admitting: *Deleted

## 2015-11-07 MED ORDER — LENALIDOMIDE 25 MG PO CAPS: ORAL_CAPSULE | ORAL | Status: DC

## 2015-11-07 NOTE — Progress Notes (Signed)
Pt has not rcvd Revlimid, reordered in epic sent escribe. Authorized and faxed on 10/27.

## 2015-11-07 NOTE — Telephone Encounter (Signed)
Patient called requesting Revlimid be sent to [facility].  Collaborative nurse notified to sent what was faxed eRx.

## 2015-11-08 ENCOUNTER — Telehealth: Payer: Self-pay | Admitting: *Deleted

## 2015-11-08 NOTE — Telephone Encounter (Signed)
Pt called requesting refill of Revlimid.  Per pt,  Script needs to be sent to [facility]. Pt's  Phone    [phone].

## 2015-11-10 ENCOUNTER — Encounter: Payer: Self-pay | Admitting: Gynecologic Oncology

## 2015-11-10 ENCOUNTER — Other Ambulatory Visit (HOSPITAL_COMMUNITY)
Admission: RE | Admit: 2015-11-10 | Discharge: 2015-11-10 | Disposition: A | Discharge status: Home / Self Care | Payer: Medicare Other | Source: Ambulatory Visit | Attending: Gynecologic Oncology | Admitting: Gynecologic Oncology

## 2015-11-10 ENCOUNTER — Ambulatory Visit: Payer: Medicare Other | Attending: Gynecologic Oncology | Admitting: Gynecologic Oncology

## 2015-11-10 VITALS — BP 127/64 | HR 94 | Temp 98.3°F | Resp 20 | Ht 66.0 in | Wt 197.2 lb

## 2015-11-10 DIAGNOSIS — R87629 Unspecified abnormal cytological findings in specimens from vagina: Secondary | ICD-10-CM

## 2015-11-10 DIAGNOSIS — Z01411 Encounter for gynecological examination (general) (routine) with abnormal findings: Secondary | ICD-10-CM | POA: Insufficient documentation

## 2015-11-10 DIAGNOSIS — D072 Carcinoma in situ of vagina: Secondary | ICD-10-CM

## 2015-11-10 DIAGNOSIS — D071 Carcinoma in situ of vulva: Secondary | ICD-10-CM | POA: Diagnosis not present

## 2015-11-10 DIAGNOSIS — R896 Abnormal cytological findings in specimens from other organs, systems and tissues: Secondary | ICD-10-CM

## 2015-11-10 DIAGNOSIS — Z1151 Encounter for screening for human papillomavirus (HPV): Secondary | ICD-10-CM | POA: Insufficient documentation

## 2015-11-10 NOTE — Addendum Note (Signed)
Addended by: Joylene John D on: 11/10/2015 05:02 PM   Modules accepted: Orders

## 2015-11-10 NOTE — Patient Instructions (Signed)
We will call you with the results of your pap smear from today.  Follow up will be based on pap results, but one year if "normal"

## 2015-11-10 NOTE — Progress Notes (Signed)
GYN ONCOLOGY OFFICE VISIT    Darlene Howard 64 y.o. female  CC: VAIN III VIN III ,+hrHPV DNA   Assessment/Plan:Darlene Howard  is a 64 y.o. with previous history of multifocal genital dysplasia  Pap and hrHPV DNA collected Colposcopy if pap abnormal Follow-up in 1 year   HPI: Darlene Howard  is a 64 y.o.  with IgA multiple myeloma under the care of Dr.Mohammed Mohammed. 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   In 02/15/2010 she underwent extensive CO2 laser ablation of the vulva perianal and vaginal dysplastic areas. A Pap test subsequently in June of 2000 the level returned with evidence of low-grade squamous intraepithelial lesion. Colposcopy was then performed with biopsy for September of 2011 was consistent with vain 1. Darlene Howard has not been seen since.  A Pap test was collected on 02/22/2014 was notable for atypical squamous cells of undetermined significance with high risk HPV detected. Patient seen 03/18/2014 vaginal and vulvar colposcopy with biopsies were  performed.. 1. Labium, biopsy,  VIN-III, EXTENDING TO THE EDGE OF THE BIOPSY. 2. Vulva, biopsy, posterior fourchette - VIN-III,  3. Vagina, biopsy, right lateral apex - VAIN-III.  On 05/06/2014 she underwent extensive multifocal CO2 laser of the vulva and vagina and WLE of a left vulvar lesion. Path Vulva, excision, inferior left labia majora (VIN-III/CIS), MARGINS NOT INVOLVED.  Pap collected 11/11/2014 ASCUS hrHPVDNA+.  Vaginal colposcopy and biopsies 11/2014 1. Vagina, biopsy, right lateral superior - BENIGN SQUAMOUS MUCOSA. 2. Labium, biopsy, right - BENIGN SQUAMOUS MUCOSA. 3. Vulva, biopsy, left - INSUFFICIENT FOR DIAGNOSIS.  Vaginal premarin Rx for atrophy.  Applied estrogen this morning  Past Surgical Hx:  Past Surgical History  Procedure Laterality Date  . Colonoscopy  10/07/2006    [id number] rectum/Status post right hemicolectomy. Normal residual colon  . Cervical cone biopsy     . Cataract extraction w/phaco Left 01/20/2014    Procedure: CATARACT EXTRACTION PHACO AND INTRAOCULAR LENS PLACEMENT (IOC);  Surgeon: Adonis Brook, MD;  Location: [facility];  Service: Ophthalmology;  Laterality: Left;  . 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 Co2  ablation vulva perianal and vaginal dysplatic areas  02-15-2010  . Abdominal hysterectomy  1983    w/  appendectomy and left salpingoophorectom  . Hemicolectomy Right 04-27-2005    CARCINOMA   ASCENDING COLON  . Vulvectomy N/A 05/06/2014    Procedure: WIDE LOCAL EXCISION LEFT LABIA MAJORA;  Surgeon: Janie Morning, MD;  Location: [facility];  Service: Gynecology;  Laterality: N/A;  . Vulvar lesion removal N/A 05/06/2014    Procedure: LASER OF VULVAR LESION;  Surgeon: Janie Morning, MD;  Location: [facility];  Service: Gynecology;  Laterality: N/A;  . Lesion removal N/A 05/06/2014    Procedure: LASER OF THE VAGINA;  Surgeon: Janie Morning, MD;  Location: [facility];  Service: Gynecology;  Laterality: N/A;  . Esophagogastroduodenoscopy  04/02/2005    [id number] polyp with central erosion versus ulceration in the body of the stomach, resected and recovered Remainder of the gastric mucosa, D1, D2 appeared normal  . Colonoscopy  10/2009    RMR: diminutive RS polyp (hyperplastic).   . Colonoscopy N/A 12/15/2014    Procedure: COLONOSCOPY;  Surgeon: Daneil Dolin, MD;  [facility];  Service: Endoscopy;  Laterality: N/A;  130     Past Medical Hx:  Past Medical History  Diagnosis Date  . Glaucoma  uses eye drops as instructed  . Personal history of colon cancer     ASCENDING COLON--  S/P  RIGHT HEMICOLECTOMY WITH NEGATIVE NODES/    NO RECURRENCE  . Herpes     takes Valtrex as needed  . GERD (gastroesophageal reflux disease)     takes Pantoprazole daily  . Hypertension     takes Amlodipine and Diovan daily  . Bruises easily     d/t being on  COumadin  . History of colon polyps   . Type 2 diabetes mellitus ([place])   . 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 H/O stem cell transplant ([place])     04-26-2006  AT [facility]  . Multiple myeloma DX  2006  STATE IIA,  IgA  KAPPA    STABLE  PER DR MOHAMED--  S/P STEM CELL TRANSPLANT 2007 /  RECURRENCE 2008  CHEMO ENDED 03-30-2008/   NOW ON MAINTANANCE REVLIMID )  . Vocal cord nodule     ASSESSED BY DR TEOH--  LMA  OK W/ PROCEDURE'S  . DDD (degenerative disc disease), lumbar   . Cholelithiasis   . Fatty liver     Past Gynecological History:   No LMP recorded. Patient has had a hysterectomy.  Family Hx:  Family History  Problem Relation Age of Onset  . Kidney failure Mother   . Hypertension Mother   . Prostate cancer Father 64  . Hypertension Father     vascular disease   . Stroke Father   . Hypertension Sister   . Hypertension Sister   . Colon cancer Neg Hx   . Breast cancer Neg Hx   . Hypertension Sister   . Hyperlipidemia Sister   . Hypertension Brother   . Hyperlipidemia Brother   Social Hx:  Brother in law committed suicide with a weapon last month.  Family is grieving  Review of Systems:  Constitutional  Feels well, weight loss since use of metformin   Genito Urinary  No frequency, urgency, dysuria, no pruritis or bleeding. Denies vaginal discharge.  Uses premarin vaginal cream regularly GI:  Intermittent diarrhea  Physical Exam:BP 127/64 mmHg  Pulse 94  Temp(Src) 98.3 F (36.8 C) (Oral)  Resp 20  Ht '5\' 6"'  (1.676 m)  Wt 197 lb 3.2 oz (89.449 kg)  BMI 31.84 kg/m2  SpO2 100%  WD in NAD Normoactive bowel sounds, abdomen soft, non-tender and obese.   Genito Urinary   Vulva/vagina: Copious amount of premain cream in the vagina.  Pap collected. No palpable vaginal or cul de sac masses.  Janie Morning, MD

## 2015-11-16 LAB — CYTOLOGY - PAP

## 2015-11-17 ENCOUNTER — Telehealth: Payer: Self-pay | Admitting: Gynecologic Oncology

## 2015-11-17 NOTE — Telephone Encounter (Signed)
Called the patient to inform her of pap results and Dr. Leone Brand recommendations for return visit with colposcopy when she is in [place] next for clinic (Dec 15).  Patient verbalizing understanding.  Advised to call for any needs or concerns.

## 2015-11-22 ENCOUNTER — Ambulatory Visit (HOSPITAL_BASED_OUTPATIENT_CLINIC_OR_DEPARTMENT_OTHER): Payer: Medicare Other | Admitting: Internal Medicine

## 2015-11-22 ENCOUNTER — Other Ambulatory Visit (HOSPITAL_BASED_OUTPATIENT_CLINIC_OR_DEPARTMENT_OTHER): Payer: Medicare Other

## 2015-11-22 ENCOUNTER — Telehealth: Payer: Self-pay | Admitting: Internal Medicine

## 2015-11-22 ENCOUNTER — Encounter: Payer: Self-pay | Admitting: Internal Medicine

## 2015-11-22 VITALS — BP 121/93 | HR 77 | Temp 98.1°F | Resp 18 | Ht 66.0 in | Wt 199.1 lb

## 2015-11-22 DIAGNOSIS — C9000 Multiple myeloma not having achieved remission: Secondary | ICD-10-CM

## 2015-11-22 LAB — COMPREHENSIVE METABOLIC PANEL (CC13)
ALBUMIN: 3 g/dL — AB (ref 3.5–5.0)
ALT: 23 U/L (ref 0–55)
AST: 20 U/L (ref 5–34)
Alkaline Phosphatase: 54 U/L (ref 40–150)
Anion Gap: 9 mEq/L (ref 3–11)
BILIRUBIN TOTAL: 1.95 mg/dL — AB (ref 0.20–1.20)
BUN: 9.9 mg/dL (ref 7.0–26.0)
CALCIUM: 9 mg/dL (ref 8.4–10.4)
CO2: 27 meq/L (ref 22–29)
CREATININE: 0.8 mg/dL (ref 0.6–1.1)
Chloride: 104 mEq/L (ref 98–109)
EGFR: 87 mL/min/{1.73_m2} — ABNORMAL LOW (ref >90)
GLUCOSE: 116 mg/dL (ref 70–140)
Potassium: 3.7 mEq/L (ref 3.5–5.1)
Sodium: 140 mEq/L (ref 136–145)
Total Protein: 6.7 g/dL (ref 6.4–8.3)

## 2015-11-22 LAB — CBC WITH DIFFERENTIAL/PLATELET
BASO%: 0.4 % (ref 0.0–2.0)
BASOS ABS: 0 10*3/uL (ref 0.0–0.1)
EOS%: 2.2 % (ref 0.0–7.0)
Eosinophils Absolute: 0.2 10*3/uL (ref 0.0–0.5)
HEMATOCRIT: 31.4 % — AB (ref 34.8–46.6)
HEMOGLOBIN: 9.7 g/dL — AB (ref 11.6–15.9)
LYMPH#: 0.8 10*3/uL — AB (ref 0.9–3.3)
LYMPH%: 7.7 % — AB (ref 14.0–49.7)
MCH: 29.6 pg (ref 25.1–34.0)
MCHC: 30.9 g/dL — AB (ref 31.5–36.0)
MCV: 95.7 fL (ref 79.5–101.0)
MONO#: 1.1 10*3/uL — AB (ref 0.1–0.9)
MONO%: 11.4 % (ref 0.0–14.0)
NEUT#: 7.8 10*3/uL — ABNORMAL HIGH (ref 1.5–6.5)
NEUT%: 78.3 % — AB (ref 38.4–76.8)
Platelets: 141 10*3/uL — ABNORMAL LOW (ref 145–400)
RBC: 3.28 10*6/uL — ABNORMAL LOW (ref 3.70–5.45)
RDW: 17.4 % — ABNORMAL HIGH (ref 11.2–14.5)
WBC: 10 10*3/uL (ref 3.9–10.3)
nRBC: 2 % — ABNORMAL HIGH (ref 0–0)

## 2015-11-22 LAB — LACTATE DEHYDROGENASE (CC13): LDH: 169 U/L (ref 125–245)

## 2015-11-22 NOTE — Progress Notes (Signed)
Darlene Howard [phone]   Fax:(336) [phone]  OFFICE PROGRESS NOTE  Darlene Nakayama, MD [address] [place] [address]  DIAGNOSIS: Stage IIA IgA kappa multiple myeloma diagnosed in 2006   PRIOR THERAPY:  1) status post treatment with thalidomide and Decadron followed by autologous peripheral blood stem cell transplant with high-dose melphalan on 04/26/2006 at [facility].  2) the patient had evidence for disease recurrence in August of 2008 and she was treated with 6 cycles of Velcade completed on 03/30/2008 with response to the treatment but was discontinued secondary to neuropathy.  3) maintenance treatment with Revlimid 10 mg by mouth daily by the does was later increase to 15 mg by mouth daily secondary to biochemical recurrence with stabilization of her disease. The treatment with Revlimid 15 mg by mouth daily was discontinued secondary to evidence for disease progression.  CURRENT THERAPY:  1) Revlimid 25 mg by mouth daily for 3 weeks every 4 weeks in addition to Decadron currently at 20 mg by mouth on a weekly basis. She is status post 6 cycles. She started cycle #7 on 11/18/2015 2) Zometa 4 mg IV every 3 months. Next dose in 05/05/2015  ADVANCED DIRECTIVE: She does not have advanced directive and was given information.  INTERVAL HISTORY: Darlene Howard 64 y.o. female returns to the clinic today for routine monthly followup visit. She is feeling fine today with no specific complaints. She is tolerating her treatment with Revlimid and Decadron fairly well with no significant adverse effects. She status post 6 cycles. The patient denied having any fever or chills. She denied having any chest pain, shortness of breath, cough or hemoptysis. She has no weight loss or night sweats. She had repeat CBC and compliance metabolic panel performed earlier today and she is here for evaluation and discussion of her lab results.   MEDICAL  HISTORY: Past Medical History  Diagnosis Date  . Glaucoma     uses eye drops as instructed  . Personal history of colon cancer     ASCENDING COLON--  S/P  RIGHT HEMICOLECTOMY WITH NEGATIVE NODES/    NO RECURRENCE  . Herpes     takes Valtrex as needed  . GERD (gastroesophageal reflux disease)     takes Pantoprazole daily  . Hypertension     takes Amlodipine and Diovan daily  . Bruises easily     d/t being on COumadin  . History of colon polyps   . Type 2 diabetes mellitus ([place])   . 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 H/O stem cell transplant ([place])     04-26-2006  AT [facility]  . Multiple myeloma DX  2006  STATE IIA,  IgA  KAPPA    STABLE  PER DR Glenden Rossell--  S/P STEM CELL TRANSPLANT 2007 /  RECURRENCE 2008  CHEMO ENDED 03-30-2008/   NOW ON MAINTANANCE REVLIMID )  . Vocal cord nodule     ASSESSED BY DR TEOH--  LMA  OK W/ PROCEDURE'S  . DDD (degenerative disc disease), lumbar   . Cholelithiasis   . Fatty liver     ALLERGIES:  is allergic to codeine.  MEDICATIONS:  Current Outpatient Prescriptions  Medication Sig Dispense Refill  . amLODipine (NORVASC) 2.5 MG tablet Take 1 tablet by mouth  daily in the [facility] 90 tablet 1  . aspirin (ASPIRIN LOW DOSE) 81 MG EC tablet Take 81  mg by mouth daily.      . bimatoprost (LUMIGAN) 0.03 % ophthalmic drops Place 1 drop into both eyes at bedtime.     . calcium-vitamin D (OSCAL 500/200 D-3) 500-200 MG-UNIT per tablet Take 1 tablet by mouth 3 (three) times daily.      . cholecalciferol (VITAMIN D) 1000 UNITS tablet Take 1,000 Units by mouth daily.    Marland Kitchen conjugated estrogens (PREMARIN) vaginal cream Place 1 Applicatorful vaginally 2 (two) times a week. 42.5 g 12  . dexamethasone (DECADRON) 4 MG tablet Take 5 tablets by mouth on  a weekly basis as directed 60 tablet 1  . gabapentin (NEURONTIN) 100 MG capsule Take 1 capsule by mouth 3  times daily 270 capsule 0  . lenalidomide (REVLIMID)  25 MG capsule TAKE 1 CAPSULE ($RemoveBe'25MG'BNziFcolQ$ ) BY MOUTH DAILY FOR 21 DAYS EVERY 4 WEEKS 21 capsule 0  . loratadine (CLARITIN) 10 MG tablet TAKE ONE TABLET BY MOUTH ONCE DAILY. 30 tablet 3  . metFORMIN (GLUCOPHAGE) 1000 MG tablet Take 1 tablet by mouth  daily with breakfast 90 tablet 1  . Multiple Vitamins-Minerals (CENTRUM SILVER PO) Take 1 tablet by mouth daily.     . ONE TOUCH ULTRA TEST test strip USE AS DIRECTED TO CHECK DAILY. 50 each 3  . pantoprazole (PROTONIX) 40 MG tablet Take 1 tablet by mouth  daily for acid reflux 90 tablet 1  . potassium chloride SA (K-DUR,KLOR-CON) 20 MEQ tablet Take 1 tablet by mouth  twice a day 180 tablet 1  . valACYclovir (VALTREX) 1000 MG tablet Take 1 tablet by mouth  daily as needed 90 tablet 1  . valsartan-hydrochlorothiazide (DIOVAN-HCT) 320-25 MG per tablet Take 1 tablet by mouth  daily 90 tablet 1  . warfarin (COUMADIN) 2 MG tablet Take 1 tablet by mouth  daily 90 tablet 0  . dorzolamide-timolol (COSOPT) 22.3-6.8 MG/ML ophthalmic solution      No current facility-administered medications for this visit.    SURGICAL HISTORY:  Past Surgical History  Procedure Laterality Date  . Colonoscopy  10/07/2006    IEP:[id number] rectum/Status post right hemicolectomy. Normal residual colon  . Cervical cone biopsy    . Cataract extraction w/phaco Left 01/20/2014    Procedure: CATARACT EXTRACTION PHACO AND INTRAOCULAR LENS PLACEMENT (IOC);  Surgeon: Darlene Brook, MD;  Location: [place];  Service: Ophthalmology;  Laterality: Left;  . 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 Co2  ablation vulva perianal and vaginal dysplatic areas  02-15-2010  . Abdominal hysterectomy  1983    w/  appendectomy and left salpingoophorectom  . Hemicolectomy Right 04-27-2005    CARCINOMA   ASCENDING COLON  . Vulvectomy N/A 05/06/2014    Procedure: WIDE LOCAL EXCISION LEFT LABIA MAJORA;  Surgeon: Darlene Morning, MD;  Location: [facility];  Service: Gynecology;  Laterality:  N/A;  . Vulvar lesion removal N/A 05/06/2014    Procedure: LASER OF VULVAR LESION;  Surgeon: Darlene Morning, MD;  Location: [facility];  Service: Gynecology;  Laterality: N/A;  . Lesion removal N/A 05/06/2014    Procedure: LASER OF THE VAGINA;  Surgeon: Darlene Morning, MD;  Location: [facility];  Service: Gynecology;  Laterality: N/A;  . Esophagogastroduodenoscopy  04/02/2005    [id number] polyp with central erosion versus ulceration in the body of the stomach, resected and recovered Remainder of the gastric mucosa, D1, D2 appeared normal  . Colonoscopy  10/2009    RMR: diminutive RS polyp (hyperplastic).   Marland Kitchen  Colonoscopy N/A 12/15/2014    Procedure: COLONOSCOPY;  Surgeon: Daneil Dolin, MD;  [facility];  Service: Endoscopy;  Laterality: N/A;  130     REVIEW OF SYSTEMS:  A comprehensive review of systems was negative except for: Constitutional: positive for fatigue   PHYSICAL EXAMINATION: General appearance: alert, cooperative and no distress Head: Normocephalic, without obvious abnormality, atraumatic Neck: no adenopathy Lymph nodes: Cervical, supraclavicular, and axillary nodes normal. Resp: clear to auscultation bilaterally Back: symmetric, no curvature. ROM normal. No CVA tenderness. Cardio: regular rate and rhythm, S1, S2 normal, no murmur, click, rub or gallop GI: soft, non-tender; bowel sounds normal; no masses,  no organomegaly Extremities: extremities normal, atraumatic, no cyanosis or edema Neurologic: Alert and oriented X 3, normal strength and tone. Normal symmetric reflexes. Normal coordination and gait  ECOG PERFORMANCE STATUS: 1 - Symptomatic but completely ambulatory  Blood pressure 121/93, pulse 77, temperature 98.1 F (36.7 C), temperature source Oral, resp. rate 18, height $RemoveBe'5\' 6"'sCNWfsfai$  (1.676 m), weight 199 lb 1.6 oz (90.311 kg), SpO2 100 %.  LABORATORY DATA: Lab Results  Component Value Date   WBC 10.0 11/22/2015    HGB 9.7* 11/22/2015   HCT 31.4* 11/22/2015   MCV 95.7 11/22/2015   PLT 141* 11/22/2015      Chemistry      Component Value Date/Time   NA 140 11/22/2015 1113   NA 142 07/18/2015 1015   K 3.7 11/22/2015 1113   K 3.9 07/18/2015 1015   CL 103 07/18/2015 1015   CL 105 06/17/2013 1441   CO2 27 11/22/2015 1113   CO2 26 07/18/2015 1015   BUN 9.9 11/22/2015 1113   BUN 11 07/18/2015 1015   CREATININE 0.8 11/22/2015 1113   CREATININE 0.79 07/18/2015 1015   CREATININE 0.81 01/14/2014 1413      Component Value Date/Time   CALCIUM 9.0 11/22/2015 1113   CALCIUM 8.8 07/18/2015 1015   ALKPHOS 54 11/22/2015 1113   ALKPHOS 54 07/18/2015 1015   AST 20 11/22/2015 1113   AST 18 07/18/2015 1015   ALT 23 11/22/2015 1113   ALT 19 07/18/2015 1015   BILITOT 1.95* 11/22/2015 1113   BILITOT 1.2 07/18/2015 1015       RADIOGRAPHIC STUDIES: No results found.  ASSESSMENT AND PLAN: This is a very pleasant 64 years old [place] American female with history of multiple myeloma currently on treatment with Revlimid and low-dose Decadron and tolerated her treatment fairly well. The patient is doing fine today with no specific complaints except for mild fatigue.  Because of disease progression, I recommended for her to continue her treatment with Revlimid and Decadron but I increased the dose of Revlimid to 25 mg by mouth daily for 21 days every 4 weeks, status post 6 months of this regimen. She started cycle #7 on 11/18/2015. Her lab work is unremarkable except for elevated serum bilirubin. I discussed the lab result with the patient and recommended for her to continue her treatment as scheduled. I will arrange for her to come back for follow-up visit in one week for reevaluation and repeat compliance metabolic panel for close monitoring of her serum bilirubin.  The patient will continue her treatment with Zometa every 3 months as a scheduled. She was advised to call immediately if she has any concerning  symptoms and interval. The patient voices understanding of current disease status and treatment options and is in agreement with the current care plan.  All questions were answered. The patient knows to  call the clinic with any problems, questions or concerns. We can certainly see the patient much sooner if necessary.  Disclaimer: This note was dictated with voice recognition software. Similar sounding words can inadvertently be transcribed and may not be corrected upon review.

## 2015-11-22 NOTE — Telephone Encounter (Signed)
Gave and pritned appt sched anda vs for pt for DEC

## 2015-11-28 ENCOUNTER — Other Ambulatory Visit: Payer: Self-pay | Admitting: Internal Medicine

## 2015-12-02 ENCOUNTER — Other Ambulatory Visit: Payer: Self-pay | Admitting: Internal Medicine

## 2015-12-02 ENCOUNTER — Other Ambulatory Visit (HOSPITAL_BASED_OUTPATIENT_CLINIC_OR_DEPARTMENT_OTHER): Payer: Medicare Other

## 2015-12-02 ENCOUNTER — Telehealth: Payer: Self-pay | Admitting: Internal Medicine

## 2015-12-02 ENCOUNTER — Ambulatory Visit (HOSPITAL_BASED_OUTPATIENT_CLINIC_OR_DEPARTMENT_OTHER): Payer: Medicare Other | Admitting: Physician Assistant

## 2015-12-02 VITALS — BP 118/47 | HR 72 | Temp 98.2°F | Resp 18 | Ht 66.0 in | Wt 197.4 lb

## 2015-12-02 DIAGNOSIS — C9002 Multiple myeloma in relapse: Secondary | ICD-10-CM

## 2015-12-02 DIAGNOSIS — C9000 Multiple myeloma not having achieved remission: Secondary | ICD-10-CM

## 2015-12-02 LAB — COMPREHENSIVE METABOLIC PANEL WITH GFR
ALT: 21 U/L (ref 0–55)
AST: 13 U/L (ref 5–34)
Albumin: 3.2 g/dL — ABNORMAL LOW (ref 3.5–5.0)
Alkaline Phosphatase: 54 U/L (ref 40–150)
Anion Gap: 11 meq/L (ref 3–11)
BUN: 16.9 mg/dL (ref 7.0–26.0)
CO2: 23 meq/L (ref 22–29)
Calcium: 9.7 mg/dL (ref 8.4–10.4)
Chloride: 106 meq/L (ref 98–109)
Creatinine: 1 mg/dL (ref 0.6–1.1)
EGFR: 71 ml/min/1.73 m2 — ABNORMAL LOW
Glucose: 134 mg/dL (ref 70–140)
Potassium: 3.9 meq/L (ref 3.5–5.1)
Sodium: 140 meq/L (ref 136–145)
Total Bilirubin: 1.41 mg/dL — ABNORMAL HIGH (ref 0.20–1.20)
Total Protein: 7.3 g/dL (ref 6.4–8.3)

## 2015-12-02 MED ORDER — LENALIDOMIDE 25 MG PO CAPS: 25.0000 mg | ORAL_CAPSULE | Freq: Every day | ORAL | Status: DC

## 2015-12-02 NOTE — Telephone Encounter (Signed)
Gave patient avs report and appointments for December  °

## 2015-12-02 NOTE — Progress Notes (Signed)
[place] [phone]   Fax:(336) [phone]  OFFICE PROGRESS NOTE  [name], MD [address] [place] [address]  DIAGNOSIS: Stage IIA IgA kappa multiple myeloma diagnosed in 2006   PRIOR THERAPY:  1) status post treatment with thalidomide and Decadron followed by autologous peripheral blood stem cell transplant with high-dose melphalan on 04/26/2006 at [facility].  2) the patient had evidence for disease recurrence in August of 2008 and she was treated with 6 cycles of Velcade completed on 03/30/2008 with response to the treatment but was discontinued secondary to neuropathy.  3) maintenance treatment with Revlimid 10 mg by mouth daily by the does was later increase to 15 mg by mouth daily secondary to biochemical recurrence with stabilization of her disease. The treatment with Revlimid 15 mg by mouth daily was discontinued secondary to evidence for disease progression.  CURRENT THERAPY:  1) Revlimid 25 mg by mouth daily for 3 weeks every 4 weeks in addition to Decadron currently at 20 mg by mouth on a weekly basis. She is status post 6 cycles. She started cycle #7 on 11/18/2015 2) Zometa 4 mg IV every 3 months. Last dose in 11/04/2015  ADVANCED DIRECTIVE: She does not have advanced directive and was given information.  INTERVAL HISTORY: Darlene Howard 64 y.o. female returns to the clinic today for routine monthly followup visit. She is feeling fine today with no specific complaints. She is tolerating her treatment with Revlimid and Decadron fairly well with no significant adverse effects. She status post 7 cycles, last dose on 12/1, today has began the rest period for 7 days. The patient denied having any fever or chills. She denied having any chest pain, shortness of breath, cough or hemoptysis. She has no weight loss or night sweats. She reports chronic neuropathy related to her diabetes, for which she is to visit her primary  care physician soon for readjustment of gabapentin.  MEDICAL HISTORY: Past Medical History  Diagnosis Date  . Glaucoma     uses eye drops as instructed  . Personal history of colon cancer     ASCENDING COLON--  S/P  RIGHT HEMICOLECTOMY WITH NEGATIVE NODES/    NO RECURRENCE  . Herpes     takes Valtrex as needed  . GERD (gastroesophageal reflux disease)     takes Pantoprazole daily  . Hypertension     takes Amlodipine and Diovan daily  . Bruises easily     d/t being on COumadin  . History of colon polyps   . Type 2 diabetes mellitus ([place])   . 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 H/O stem cell transplant ([place])     04-26-2006  AT [facility]  . Multiple myeloma DX  2006  STATE IIA,  IgA  KAPPA    STABLE  PER DR MOHAMED--  S/P STEM CELL TRANSPLANT 2007 /  RECURRENCE 2008  CHEMO ENDED 03-30-2008/   NOW ON MAINTANANCE REVLIMID )  . Vocal cord nodule     ASSESSED BY DR TEOH--  LMA  OK W/ PROCEDURE'S  . DDD (degenerative disc disease), lumbar   . Cholelithiasis   . Fatty liver     ALLERGIES:  is allergic to codeine.  MEDICATIONS:  Current Outpatient Prescriptions  Medication Sig Dispense Refill  . amLODipine (NORVASC) 2.5 MG tablet Take 1 tablet by mouth  daily in the morning 90 tablet 1  .  aspirin (ASPIRIN LOW DOSE) 81 MG EC tablet Take 81 mg by mouth daily.      . bimatoprost (LUMIGAN) 0.03 % ophthalmic drops Place 1 drop into both eyes at bedtime.     . calcium-vitamin D (OSCAL 500/200 D-3) 500-200 MG-UNIT per tablet Take 1 tablet by mouth 3 (three) times daily.      . cholecalciferol (VITAMIN D) 1000 UNITS tablet Take 1,000 Units by mouth daily.    Marland Kitchen conjugated estrogens (PREMARIN) vaginal cream Place 1 Applicatorful vaginally 2 (two) times a week. 42.5 g 12  . dexamethasone (DECADRON) 4 MG tablet Take 5 tablets by mouth on  a weekly basis as directed 60 tablet 1  . dorzolamide-timolol (COSOPT) 22.3-6.8 MG/ML ophthalmic  solution     . gabapentin (NEURONTIN) 100 MG capsule Take 1 capsule by mouth 3  times daily 270 capsule 0  . loratadine (CLARITIN) 10 MG tablet TAKE ONE TABLET BY MOUTH ONCE DAILY. 30 tablet 3  . metFORMIN (GLUCOPHAGE) 1000 MG tablet Take 1 tablet by mouth  daily with breakfast 90 tablet 1  . Multiple Vitamins-Minerals (CENTRUM SILVER PO) Take 1 tablet by mouth daily.     . ONE TOUCH ULTRA TEST test strip USE AS DIRECTED TO CHECK DAILY. 50 each 3  . pantoprazole (PROTONIX) 40 MG tablet Take 1 tablet by mouth  daily for acid reflux 90 tablet 1  . potassium chloride SA (K-DUR,KLOR-CON) 20 MEQ tablet Take 1 tablet by mouth  twice a day 180 tablet 1  . REVLIMID 25 MG capsule TAKE 1 CAPSULE (25MG) BY MOUTH DAILY FOR 21 DAYS EVERY 4 WEEKS. 21 capsule 1  . valACYclovir (VALTREX) 1000 MG tablet Take 1 tablet by mouth  daily as needed 90 tablet 1  . valsartan-hydrochlorothiazide (DIOVAN-HCT) 320-25 MG per tablet Take 1 tablet by mouth  daily 90 tablet 1  . warfarin (COUMADIN) 2 MG tablet Take 1 tablet by mouth  daily 90 tablet 0   No current facility-administered medications for this visit.    SURGICAL HISTORY:  Past Surgical History  Procedure Laterality Date  . Colonoscopy  10/07/2006    [id number] rectum/Status post right hemicolectomy. Normal residual colon  . Cervical cone biopsy    . Cataract extraction w/phaco Left 01/20/2014    Procedure: CATARACT EXTRACTION PHACO AND INTRAOCULAR LENS PLACEMENT (IOC);  Surgeon: Adonis Brook, MD;  Location: [place];  Service: Ophthalmology;  Laterality: Left;  . 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 Co2  ablation vulva perianal and vaginal dysplatic areas  02-15-2010  . Abdominal hysterectomy  1983    w/  appendectomy and left salpingoophorectom  . Hemicolectomy Right 04-27-2005    CARCINOMA   ASCENDING COLON  . Vulvectomy N/A 05/06/2014    Procedure: WIDE LOCAL EXCISION LEFT LABIA MAJORA;  Surgeon: Janie Morning, MD;  Location: [facility];  Service: Gynecology;  Laterality: N/A;  . Vulvar lesion removal N/A 05/06/2014    Procedure: LASER OF VULVAR LESION;  Surgeon: Janie Morning, MD;  Location: [facility];  Service: Gynecology;  Laterality: N/A;  . Lesion removal N/A 05/06/2014    Procedure: LASER OF THE VAGINA;  Surgeon: Janie Morning, MD;  Location: [facility];  Service: Gynecology;  Laterality: N/A;  . Esophagogastroduodenoscopy  04/02/2005    [id number] polyp with central erosion versus ulceration in the body of the stomach, resected and recovered Remainder of the gastric mucosa, D1, D2 appeared normal  . Colonoscopy  10/2009    RMR: diminutive RS polyp (hyperplastic).   . Colonoscopy N/A 12/15/2014    Procedure: COLONOSCOPY;  Surgeon: Daneil Dolin, MD;  [facility];  Service: Endoscopy;  Laterality: N/A;  130     REVIEW OF SYSTEMS:  A comprehensive review of systems was negative except for: Constitutional: positive for fatigue   PHYSICAL EXAMINATION: General appearance: alert, cooperative and no distress Head: Normocephalic, without obvious abnormality, atraumatic Neck: no adenopathy Lymph nodes: Cervical, supraclavicular, and axillary nodes normal. Resp: clear to auscultation bilaterally Back: symmetric, no curvature. ROM normal. No CVA tenderness. Cardio: regular rate and rhythm, S1, S2 normal, no murmur, click, rub or gallop GI: soft, non-tender; bowel sounds normal; no masses,  no organomegaly Extremities: extremities normal, atraumatic, no cyanosis or edema Neurologic: Alert and oriented X 3, normal strength and tone. Normal symmetric reflexes. Normal coordination and gait  ECOG PERFORMANCE STATUS: 1 - Symptomatic but completely ambulatory  Blood pressure 118/47, pulse 72, temperature 98.2 F (36.8 C), temperature source Oral, resp. rate 18, height 5' 6" (1.676 m), weight 197 lb 6.4 oz (89.54 kg), SpO2 100 %.  LABORATORY DATA:   CBC  Latest Ref Rng 11/22/2015 10/13/2015 09/26/2015  WBC 3.9 - 10.3 10e3/uL 10.0 6.9 9.3  Hemoglobin 11.6 - 15.9 g/dL 9.7(L) 9.8(L) 10.1(L)  Hematocrit 34.8 - 46.6 % 31.4(L) 31.0(L) 32.4(L)  Platelets 145 - 400 10e3/uL 141(L) 129(L) 136(L)   CMP Latest Ref Rng 12/02/2015 11/22/2015 10/13/2015  Glucose 70 - 140 mg/dl 134 116 120  BUN 7.0 - 26.0 mg/dL 16.9 9.9 10.2  Creatinine 0.6 - 1.1 mg/dL 1.0 0.8 0.9  Sodium 136 - 145 mEq/L 140 140 140  Potassium 3.5 - 5.1 mEq/L 3.9 3.7 3.6  Chloride 96 - 112 mEq/L - - -  CO2 22 - 29 mEq/L _0 Calcium 8.4 - 10.4 mg/dL 9.7 9.0 9.6  Total Protein 6.4 - 8.3 g/dL 7.3 6.7 7.2  Total Bilirubin 0.20 - 1.20 mg/dL 1.41(H) 1.95(H) 1.39(H)  Alkaline Phos 40 - 150 U/L 54 54 63  AST 5 - 34 U/L _1 ALT 0 - 55 U/L _2 RADIOGRAPHIC STUDIES: No results found.  ASSESSMENT AND PLAN: This is a very pleasant 65 year old African American female with history of multiple myeloma currently on treatment with Revlimid and low-dose Decadron and tolerated her treatment fairly well. The patient is doing fine today with no specific complaints. Because of disease progression, recommended for her to continue her treatment with Revlimid and Decadron but I increased the dose of Revlimid to 25 mg by mouth daily for 21 days every 4 weeks, status post 6 months of this regimen. She started cycle #7 on 11/18/2015 Until 12/01/2015. She is now on her week off Her lab work is unremarkable except for elevated serum bilirubin, although this is trending down compared to prior week. A copy of the labs have been submitted to her per request.  She will return for follow-up visit in 2 week for reevaluation,  and repeat compliance metabolic panel for close monitoring of her serum bilirubin.  A new prescription refill for Revlimid is to be called to the [facility] The patient will continue her treatment with Zometa every 3 months as a scheduled. She was advised to call  immediately if she has any concerning symptoms and interval. The patient voices understanding of current disease status and treatment options and is in agreement with the current  care plan.   ADDENDUM: Hematology/Oncology Attending: I had a face to face encounter with the patient. I recommended her care plan. This is a very pleasant 64 years old African-American female with relapsed multiple myeloma currently on treatment with Revlimid and Decadron. Her total bilirubin was elevated 2 weeks ago. The patient came today for reevaluation with repeat compliance metabolic panel. Total bilirun is down but still slightly elevated. I recommended for the patient to resume her treatment with Revlimid and Decadron. We will see her back for follow-up visit in 2 weeks for reevaluation and repeat CBC and comprehensive metabolic panel. The patient was advised to call immediately if she has any concerning symptoms in the interval.  Disclaimer: This note was dictated with voice recognition software. Similar sounding words can inadvertently be transcribed and may be missed upon review. MOHAMED,MOHAMED K., MD 12/03/2015        

## 2015-12-05 ENCOUNTER — Telehealth: Payer: Self-pay | Admitting: Medical Oncology

## 2015-12-05 ENCOUNTER — Other Ambulatory Visit: Payer: Self-pay | Admitting: Internal Medicine

## 2015-12-05 ENCOUNTER — Other Ambulatory Visit: Payer: Self-pay | Admitting: Medical Oncology

## 2015-12-05 ENCOUNTER — Other Ambulatory Visit: Payer: Self-pay | Admitting: Gynecologic Oncology

## 2015-12-05 NOTE — Telephone Encounter (Signed)
RECEIVED GRANT FROM [facility].

## 2015-12-06 DIAGNOSIS — E1165 Type 2 diabetes mellitus with hyperglycemia: Secondary | ICD-10-CM | POA: Diagnosis not present

## 2015-12-06 DIAGNOSIS — B351 Tinea unguium: Secondary | ICD-10-CM | POA: Diagnosis not present

## 2015-12-06 NOTE — Telephone Encounter (Signed)
I faxed back response that pt is starting cycle 8 of revlimid and gets it for 21 days and rest for 7 days.

## 2015-12-07 ENCOUNTER — Encounter: Payer: Self-pay | Admitting: Internal Medicine

## 2015-12-07 NOTE — Progress Notes (Signed)
Patient approved for $10,000 grant through [facility] from 01/01/16-12/30/16.Approval letter and Reimbursement Request Form emailed to Arline Asp in billing.

## 2015-12-15 ENCOUNTER — Ambulatory Visit: Payer: Medicare Other | Attending: Gynecologic Oncology | Admitting: Gynecologic Oncology

## 2015-12-15 ENCOUNTER — Encounter: Payer: Self-pay | Admitting: Gynecologic Oncology

## 2015-12-15 VITALS — BP 126/72 | HR 102 | Temp 98.4°F | Resp 20 | Ht 66.0 in | Wt 194.1 lb

## 2015-12-15 DIAGNOSIS — D072 Carcinoma in situ of vagina: Secondary | ICD-10-CM

## 2015-12-15 DIAGNOSIS — D071 Carcinoma in situ of vulva: Secondary | ICD-10-CM

## 2015-12-15 NOTE — Progress Notes (Signed)
GYN ONCOLOGY OFFICE VISIT    Darlene Howard 64 y.o. female  CC: VAIN III VIN III ,+hrHPV DNA   Assessment/Plan:[name]  is a 64 y.o. with previous history of multifocal genital dysplasia  Pap and hrHPV DNA collected Colposcopy if pap abnormal Follow-up in 1 year   HPI: [name]  is a 64 y.o.  with IgA multiple myeloma under the care of Dr.Mohammed Mohammed. 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   In 02/15/2010 she underwent extensive CO2 laser ablation of the vulva perianal and vaginal dysplastic areas. A Pap test subsequently in June of 2000 the level returned with evidence of low-grade squamous intraepithelial lesion. Colposcopy was then performed with biopsy for September of 2011 was consistent with vain 1. [name] has not been seen since.  A Pap test was collected on 02/22/2014 was notable for atypical squamous cells of undetermined significance with high risk HPV detected. Patient seen 03/18/2014 vaginal and vulvar colposcopy with biopsies were  performed.. 1. Labium, biopsy,  VIN-III, EXTENDING TO THE EDGE OF THE BIOPSY. 2. Vulva, biopsy, posterior fourchette - VIN-III,  3. Vagina, biopsy, right lateral apex - VAIN-III.  On 05/06/2014 she underwent extensive multifocal CO2 laser of the vulva and vagina and WLE of a left vulvar lesion. Path Vulva, excision, inferior left labia majora (VIN-III/CIS), MARGINS NOT INVOLVED.  Pap collected 11/11/2014 ASCUS hrHPVDNA+.  Vaginal colposcopy and biopsies 11/2014 1. Vagina, biopsy, right lateral superior - BENIGN SQUAMOUS MUCOSA. 2. Labium, biopsy, right - BENIGN SQUAMOUS MUCOSA. 3. Vulva, biopsy, left - INSUFFICIENT FOR DIAGNOSIS.  Vaginal premarin Rx for atrophy.  Applied estrogen this morning  Past Surgical Hx:  Past Surgical History  Procedure Laterality Date  . Colonoscopy  10/07/2006    NWG:[id number] rectum/Status post right hemicolectomy. Normal residual colon  . Cervical cone biopsy     . Cataract extraction w/phaco Left 01/20/2014    Procedure: CATARACT EXTRACTION PHACO AND INTRAOCULAR LENS PLACEMENT (IOC);  Surgeon: Adonis Brook, MD;  Location: [place];  Service: Ophthalmology;  Laterality: Left;  . 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 Co2  ablation vulva perianal and vaginal dysplatic areas  02-15-2010  . Abdominal hysterectomy  1983    w/  appendectomy and left salpingoophorectom  . Hemicolectomy Right 04-27-2005    CARCINOMA   ASCENDING COLON  . Vulvectomy N/A 05/06/2014    Procedure: WIDE LOCAL EXCISION LEFT LABIA MAJORA;  Surgeon: Janie Morning, MD;  Location: [facility];  Service: Gynecology;  Laterality: N/A;  . Vulvar lesion removal N/A 05/06/2014    Procedure: LASER OF VULVAR LESION;  Surgeon: Janie Morning, MD;  Location: [facility];  Service: Gynecology;  Laterality: N/A;  . Lesion removal N/A 05/06/2014    Procedure: LASER OF THE VAGINA;  Surgeon: Janie Morning, MD;  Location: [facility];  Service: Gynecology;  Laterality: N/A;  . Esophagogastroduodenoscopy  04/02/2005    YQM:[id number]/[id number] polyp with central erosion versus ulceration in the body of the stomach, resected and recovered Remainder of the gastric mucosa, D1, D2 appeared normal  . Colonoscopy  10/2009    RMR: diminutive RS polyp (hyperplastic).   . Colonoscopy N/A 12/15/2014    Procedure: COLONOSCOPY;  Surgeon: Daneil Dolin, MD;  [facility];  Service: Endoscopy;  Laterality: N/A;  130     Past Medical Hx:  Past Medical History  Diagnosis Date  . Glaucoma  uses eye drops as instructed  . Personal history of colon cancer     ASCENDING COLON--  S/P  RIGHT HEMICOLECTOMY WITH NEGATIVE NODES/    NO RECURRENCE  . Herpes     takes Valtrex as needed  . GERD (gastroesophageal reflux disease)     takes Pantoprazole daily  . Hypertension     takes Amlodipine and Diovan daily  . Bruises easily     d/t being on  COumadin  . History of colon polyps   . Type 2 diabetes mellitus ([place])   . 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 H/O stem cell transplant ([place])     04-26-2006  AT [facility]  . Multiple myeloma DX  2006  STATE IIA,  IgA  KAPPA    STABLE  PER DR MOHAMED--  S/P STEM CELL TRANSPLANT 2007 /  RECURRENCE 2008  CHEMO ENDED 03-30-2008/   NOW ON MAINTANANCE REVLIMID )  . Vocal cord nodule     ASSESSED BY DR TEOH--  LMA  OK W/ PROCEDURE'S  . DDD (degenerative disc disease), lumbar   . Cholelithiasis   . Fatty liver     Past Gynecological History:   No LMP recorded. Patient has had a hysterectomy.  Family Hx:  Family History  Problem Relation Age of Onset  . Kidney failure Mother   . Hypertension Mother   . Prostate cancer Father 19  . Hypertension Father     vascular disease   . Stroke Father   . Hypertension Sister   . Hypertension Sister   . Colon cancer Neg Hx   . Breast cancer Neg Hx   . Hypertension Sister   . Hyperlipidemia Sister   . Hypertension Brother   . Hyperlipidemia Brother   Social Hx:  Brother in law committed suicide with a weapon last month.  Family is grieving  Review of Systems:  Constitutional  Feels well, weight loss since use of metformin   Genito Urinary  No frequency, urgency, dysuria, no pruritis or bleeding. Denies vaginal discharge.  Uses premarin vaginal cream regularly GI:  Intermittent diarrhea  Physical Exam:BP 126/72 mmHg  Pulse 102  Temp(Src) 98.4 F (36.9 C) (Oral)  Resp 20  Ht '5\' 6"'  (1.676 m)  Wt 194 lb 1.6 oz (88.043 kg)  BMI 31.34 kg/m2  SpO2 99%  WD in NAD Normoactive bowel sounds, abdomen soft, non-tender and obese.   Genito Urinary   Vulva/vagina: Copious amount of premain cream in the vagina.  Pap collected. No palpable vaginal or cul de sac masses.  Janie Morning, MD

## 2015-12-16 ENCOUNTER — Other Ambulatory Visit: Payer: Self-pay | Admitting: Family Medicine

## 2015-12-16 ENCOUNTER — Other Ambulatory Visit: Payer: Self-pay

## 2015-12-16 ENCOUNTER — Telehealth: Payer: Self-pay | Admitting: Internal Medicine

## 2015-12-16 ENCOUNTER — Other Ambulatory Visit (HOSPITAL_BASED_OUTPATIENT_CLINIC_OR_DEPARTMENT_OTHER): Payer: Medicare Other

## 2015-12-16 ENCOUNTER — Encounter: Payer: Self-pay | Admitting: Physician Assistant

## 2015-12-16 ENCOUNTER — Other Ambulatory Visit: Payer: Self-pay | Admitting: Physician Assistant

## 2015-12-16 ENCOUNTER — Ambulatory Visit (HOSPITAL_BASED_OUTPATIENT_CLINIC_OR_DEPARTMENT_OTHER): Payer: Medicare Other | Admitting: Physician Assistant

## 2015-12-16 VITALS — BP 122/53 | HR 94 | Temp 98.1°F | Resp 18 | Ht 66.0 in | Wt 195.3 lb

## 2015-12-16 DIAGNOSIS — C9000 Multiple myeloma not having achieved remission: Secondary | ICD-10-CM

## 2015-12-16 DIAGNOSIS — C9002 Multiple myeloma in relapse: Secondary | ICD-10-CM | POA: Diagnosis present

## 2015-12-16 DIAGNOSIS — R7989 Other specified abnormal findings of blood chemistry: Secondary | ICD-10-CM

## 2015-12-16 DIAGNOSIS — R945 Abnormal results of liver function studies: Secondary | ICD-10-CM

## 2015-12-16 LAB — CBC WITH DIFFERENTIAL/PLATELET
BASO%: 0.6 % (ref 0.0–2.0)
BASOS ABS: 0 10*3/uL (ref 0.0–0.1)
EOS%: 2.4 % (ref 0.0–7.0)
Eosinophils Absolute: 0.2 10*3/uL (ref 0.0–0.5)
HEMATOCRIT: 31.3 % — AB (ref 34.8–46.6)
HGB: 9.6 g/dL — ABNORMAL LOW (ref 11.6–15.9)
LYMPH%: 9.8 % — AB (ref 14.0–49.7)
MCH: 29.7 pg (ref 25.1–34.0)
MCHC: 30.7 g/dL — AB (ref 31.5–36.0)
MCV: 96.9 fL (ref 79.5–101.0)
MONO#: 0.6 10*3/uL (ref 0.1–0.9)
MONO%: 9.9 % (ref 0.0–14.0)
NEUT#: 4.9 10*3/uL (ref 1.5–6.5)
NEUT%: 77.3 % — AB (ref 38.4–76.8)
Platelets: 136 10*3/uL — ABNORMAL LOW (ref 145–400)
RBC: 3.23 10*6/uL — ABNORMAL LOW (ref 3.70–5.45)
RDW: 17.5 % — ABNORMAL HIGH (ref 11.2–14.5)
WBC: 6.3 10*3/uL (ref 3.9–10.3)
lymph#: 0.6 10*3/uL — ABNORMAL LOW (ref 0.9–3.3)
nRBC: 2 % — ABNORMAL HIGH (ref 0–0)

## 2015-12-16 LAB — COMPREHENSIVE METABOLIC PANEL
ALT: 29 U/L (ref 0–55)
AST: 19 U/L (ref 5–34)
Albumin: 2.9 g/dL — ABNORMAL LOW (ref 3.5–5.0)
Alkaline Phosphatase: 59 U/L (ref 40–150)
Anion Gap: 10 mEq/L (ref 3–11)
BILIRUBIN TOTAL: 1.71 mg/dL — AB (ref 0.20–1.20)
BUN: 11.9 mg/dL (ref 7.0–26.0)
CO2: 26 meq/L (ref 22–29)
Calcium: 9.5 mg/dL (ref 8.4–10.4)
Chloride: 103 mEq/L (ref 98–109)
Creatinine: 1.1 mg/dL (ref 0.6–1.1)
EGFR: 60 mL/min/{1.73_m2} — AB (ref >90)
GLUCOSE: 139 mg/dL (ref 70–140)
Potassium: 3.8 mEq/L (ref 3.5–5.1)
SODIUM: 139 meq/L (ref 136–145)
TOTAL PROTEIN: 7.4 g/dL (ref 6.4–8.3)

## 2015-12-16 LAB — TECHNOLOGIST REVIEW

## 2015-12-16 NOTE — Progress Notes (Signed)
[place] [phone]   [phone]  OFFICE PROGRESS NOTE  [name], MD [address], [place] [address]  DIAGNOSIS: Stage IIA IgA kappa multiple myeloma diagnosed in 2006   PRIOR THERAPY:  1) status post treatment with thalidomide and Decadron followed by autologous peripheral blood stem cell transplant with high-dose melphalan on 04/26/2006 at [facility].  2) the patient had evidence for disease recurrence in August of 2008 and she was treated with 6 cycles of Velcade completed on 03/30/2008 with response to the treatment but was discontinued secondary to neuropathy.  3) maintenance treatment with Revlimid 10 mg by mouth daily by the does was later increase to 15 mg by mouth daily secondary to biochemical recurrence with stabilization of her disease. The treatment with Revlimid 15 mg by mouth daily was discontinued secondary to evidence for disease progression.  CURRENT THERAPY:  1) Revlimid 25 mg by mouth daily for 3 weeks every 4 weeks in addition to Decadron currently at 20 mg by mouth on a weekly basis. She is status post 7 cycles. She started cycle #8 on 12/09/2015, placed on hold on 12/16 due to elevated LFTs 2) Zometa 4 mg IV every 3 months. Last dose in 11/04/2015  ADVANCED DIRECTIVE: She does not have advanced directive and was given information.  INTERVAL HISTORY: ERNESTYNE CALDWELL 64 y.o. female returns to the clinic today for routine monthly followup visit. She is feeling fine today with no specific complaints. She is tolerating her treatment with Revlimid and Decadron fairly well with no significant adverse effects. She began cycle 8 on 12/09/15 through 12/29, with her off week on first week of January. The patient denied having any fever or chills. She denied having any chest pain, shortness of breath, cough or hemoptysis. She has no weight loss or night sweats. She reports chronic neuropathy related to her  diabetes, for which she is to visit her primary care physician soon for readjustment of gabapentin.  MEDICAL HISTORY: Past Medical History  Diagnosis Date  . Glaucoma     uses eye drops as instructed  . Personal history of colon cancer     ASCENDING COLON--  S/P  RIGHT HEMICOLECTOMY WITH NEGATIVE NODES/    NO RECURRENCE  . Herpes     takes Valtrex as needed  . GERD (gastroesophageal reflux disease)     takes Pantoprazole daily  . Hypertension     takes Amlodipine and Diovan daily  . Bruises easily     d/t being on COumadin  . History of colon polyps   . Type 2 diabetes mellitus ([place])   . 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 H/O stem cell transplant ([place])     04-26-2006  AT [facility]  . Multiple myeloma DX  2006  STATE IIA,  IgA  KAPPA    STABLE  PER DR MOHAMED--  S/P STEM CELL TRANSPLANT 2007 /  RECURRENCE 2008  CHEMO ENDED 03-30-2008/   NOW ON MAINTANANCE REVLIMID )  . Vocal cord nodule     ASSESSED BY DR TEOH--  LMA  OK W/ PROCEDURE'S  . DDD (degenerative disc disease), lumbar   . Cholelithiasis   . Fatty liver     ALLERGIES:  is allergic to codeine.  MEDICATIONS:  Current Outpatient Prescriptions  Medication Sig Dispense Refill  . amLODipine (NORVASC) 2.5 MG tablet Take 1 tablet by mouth  daily in the morning 90 tablet 1  . aspirin (ASPIRIN LOW DOSE) 81 MG EC tablet Take 81 mg by mouth daily.      . bimatoprost (LUMIGAN) 0.03 % ophthalmic drops Place 1 drop into both eyes at bedtime.     . calcium-vitamin D (OSCAL 500/200 D-3) 500-200 MG-UNIT per tablet Take 1 tablet by mouth 3 (three) times daily.      . cholecalciferol (VITAMIN D) 1000 UNITS tablet Take 1,000 Units by mouth daily.    Marland Kitchen dexamethasone (DECADRON) 4 MG tablet TAKE 5 TABLETS BY MOUTH ON  A WEEKLY BASIS AS DIRECTED 80 tablet 1  . dorzolamide-timolol (COSOPT) 22.3-6.8 MG/ML ophthalmic solution     . gabapentin (NEURONTIN) 100 MG capsule Take 1 capsule  by mouth 3  times daily 270 capsule 0  . lenalidomide (REVLIMID) 25 MG capsule Take 1 capsule (25 mg total) by mouth daily. 21 capsule 1  . loperamide (IMODIUM A-D) 2 MG tablet Take 2 mg by mouth 4 (four) times daily as needed for diarrhea or loose stools.    Marland Kitchen loratadine (CLARITIN) 10 MG tablet TAKE ONE TABLET BY MOUTH ONCE DAILY. 30 tablet 3  . metFORMIN (GLUCOPHAGE) 1000 MG tablet Take 1 tablet by mouth  daily with breakfast 90 tablet 1  . Multiple Vitamins-Minerals (CENTRUM SILVER PO) Take 1 tablet by mouth daily.     . ONE TOUCH ULTRA TEST test strip USE AS DIRECTED TO CHECK DAILY. 50 each 3  . pantoprazole (PROTONIX) 40 MG tablet Take 1 tablet by mouth  daily for acid reflux 90 tablet 1  . potassium chloride SA (K-DUR,KLOR-CON) 20 MEQ tablet Take 1 tablet by mouth  twice a day 180 tablet 1  . PREMARIN vaginal cream PLACE 1 APPLICATORFUL VAGINALLY TWICE A WEEK. 30 g 0  . valACYclovir (VALTREX) 1000 MG tablet Take 1 tablet by mouth  daily as needed 90 tablet 1  . valsartan-hydrochlorothiazide (DIOVAN-HCT) 320-25 MG per tablet Take 1 tablet by mouth  daily 90 tablet 1  . warfarin (COUMADIN) 2 MG tablet Take 1 tablet by mouth  daily 90 tablet 0   No current facility-administered medications for this visit.    SURGICAL HISTORY:  Past Surgical History  Procedure Laterality Date  . Colonoscopy  10/07/2006    [id number] rectum/Status post right hemicolectomy. Normal residual colon  . Cervical cone biopsy    . Cataract extraction w/phaco Left 01/20/2014    Procedure: CATARACT EXTRACTION PHACO AND INTRAOCULAR LENS PLACEMENT (IOC);  Surgeon: Adonis Brook, MD;  Location: [place];  Service: Ophthalmology;  Laterality: Left;  . 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 Co2  ablation vulva perianal and vaginal dysplatic areas  02-15-2010  . Abdominal hysterectomy  1983    w/  appendectomy and left salpingoophorectom  . Hemicolectomy Right 04-27-2005    CARCINOMA   ASCENDING COLON  .  Vulvectomy N/A 05/06/2014    Procedure: WIDE LOCAL EXCISION LEFT LABIA MAJORA;  Surgeon: Janie Morning, MD;  Location: [facility];  Service: Gynecology;  Laterality: N/A;  . Vulvar lesion removal N/A 05/06/2014    Procedure: LASER OF VULVAR LESION;  Surgeon: Janie Morning, MD;  Location: [facility];  Service: Gynecology;  Laterality: N/A;  . Lesion removal N/A 05/06/2014    Procedure: LASER OF THE VAGINA;  Surgeon: Janie Morning, MD;  Location: [facility];  Service: Gynecology;  Laterality: N/A;  . Esophagogastroduodenoscopy  04/02/2005    [id number]  polyp with central erosion versus ulceration in the body of the stomach, resected and recovered Remainder of the gastric mucosa, D1, D2 appeared normal  . Colonoscopy  10/2009    RMR: diminutive RS polyp (hyperplastic).   . Colonoscopy N/A 12/15/2014    Procedure: COLONOSCOPY;  Surgeon: Daneil Dolin, MD;  [facility];  Service: Endoscopy;  Laterality: N/A;  130     REVIEW OF SYSTEMS:  A comprehensive review of systems was negative except for: Constitutional: positive for fatigue   PHYSICAL EXAMINATION: General appearance: alert, cooperative and no distress Head: Normocephalic, without obvious abnormality, atraumatic Neck: no adenopathy Lymph nodes: Cervical, supraclavicular, and axillary nodes normal. Resp: clear to auscultation bilaterally Back: symmetric, no curvature. ROM normal. No CVA tenderness. Cardio: regular rate and rhythm, S1, S2 normal, no murmur, click, rub or gallop GI: soft, non-tender; bowel sounds normal; no masses,  no organomegaly Extremities: extremities normal, atraumatic, no cyanosis or edema Neurologic: Alert and oriented X 3, normal strength and tone. Normal symmetric reflexes. Normal coordination and gait  ECOG PERFORMANCE STATUS: 1 - Symptomatic but completely ambulatory  Blood pressure 122/53, pulse 94, temperature 98.1 F (36.7 C),  temperature source Oral, resp. rate 18, height _0  (1.676 m), weight 195 lb 4.8 oz (88.587 kg), SpO2 100 %.  LABORATORY DATA:   CBC Latest Ref Rng 12/16/2015 11/22/2015 10/13/2015  WBC 3.9 - 10.3 10e3/uL 6.3 10.0 6.9  Hemoglobin 11.6 - 15.9 g/dL 9.6(L) 9.7(L) 9.8(L)  Hematocrit 34.8 - 46.6 % 31.3(L) 31.4(L) 31.0(L)  Platelets 145 - 400 10e3/uL 136(L) 141(L) 129(L)   CMP Latest Ref Rng 12/16/2015 12/02/2015 11/22/2015  Glucose 70 - 140 mg/dl 139 134 116  BUN 7.0 - 26.0 mg/dL 11.9 16.9 9.9  Creatinine 0.6 - 1.1 mg/dL 1.1 1.0 0.8  Sodium 136 - 145 mEq/L 139 140 140  Potassium 3.5 - 5.1 mEq/L 3.8 3.9 3.7  Chloride 96 - 112 mEq/L - - -  CO2 22 - 29 mEq/L _1 Calcium 8.4 - 10.4 mg/dL 9.5 9.7 9.0  Total Protein 6.4 - 8.3 g/dL 7.4 7.3 6.7  Total Bilirubin 0.20 - 1.20 mg/dL 1.71(H) 1.41(H) 1.95(H)  Alkaline Phos 40 - 150 U/L 59 54 54  AST 5 - 34 U/L _2 ALT 0 - 55 U/L _3 RADIOGRAPHIC STUDIES: No results found.  ASSESSMENT AND PLAN: This is a very pleasant 64 year old African American female with history of multiple myeloma currently on treatment with Revlimid and low-dose Decadron and tolerated her treatment fairly well. The patient is doing fine today with no specific complaints. Because of disease progression, recommended for her to continue her treatment with Revlimid and Decadron but I increased the dose of Revlimid to 25 mg by mouth daily for 21 days every 4 weeks, status post 7 months of this regimen. She started cycle #8 on 12/09/2015 through 12/29 and then 1 week off. Due to her abnormal bilirubin, her chemo will be held until next week. She will return for labs in 1 week and will determine if she can resume After that visit, she will return prior to the start of cycle 9, and repeat compete metabolic panel for close monitoring of her serum bilirubin.  A new prescription refill for Revlimid is to be called to the [facility] pharmacy when needed prior to the  next cycle The patient will continue her treatment with Zometa every 3 months as a scheduled, next dose  due on 2/4. She was advised to call immediately if she has any concerning symptoms and interval. The patient voices understanding of current disease status and treatment options and is in agreement with the current care plan.  Sharene Butters E, PA-C

## 2015-12-16 NOTE — Telephone Encounter (Signed)
Gave and prnted appt sched and avs for pt for DEC and Jan

## 2015-12-23 ENCOUNTER — Other Ambulatory Visit: Payer: Self-pay | Admitting: Medical Oncology

## 2015-12-23 ENCOUNTER — Other Ambulatory Visit: Payer: Self-pay | Admitting: Internal Medicine

## 2015-12-23 ENCOUNTER — Other Ambulatory Visit (HOSPITAL_COMMUNITY)
Admission: RE | Admit: 2015-12-23 | Discharge: 2015-12-23 | Disposition: A | Discharge status: Home / Self Care | Payer: Medicare Other | Source: Ambulatory Visit | Attending: Internal Medicine | Admitting: Internal Medicine

## 2015-12-23 ENCOUNTER — Ambulatory Visit (HOSPITAL_BASED_OUTPATIENT_CLINIC_OR_DEPARTMENT_OTHER): Payer: Medicare Other | Admitting: Physician Assistant

## 2015-12-23 ENCOUNTER — Other Ambulatory Visit (HOSPITAL_BASED_OUTPATIENT_CLINIC_OR_DEPARTMENT_OTHER): Payer: Medicare Other

## 2015-12-23 VITALS — BP 141/58 | HR 86 | Temp 98.0°F | Resp 18 | Ht 66.0 in | Wt 199.6 lb

## 2015-12-23 DIAGNOSIS — C9000 Multiple myeloma not having achieved remission: Secondary | ICD-10-CM

## 2015-12-23 LAB — CBC WITH DIFFERENTIAL/PLATELET
BASO%: 0.1 % (ref 0.0–2.0)
BASOS ABS: 0 10*3/uL (ref 0.0–0.1)
EOS%: 0 % (ref 0.0–7.0)
Eosinophils Absolute: 0 10*3/uL (ref 0.0–0.5)
HCT: 28.3 % — ABNORMAL LOW (ref 34.8–46.6)
HEMOGLOBIN: 8.9 g/dL — AB (ref 11.6–15.9)
LYMPH#: 0.9 10*3/uL (ref 0.9–3.3)
LYMPH%: 5.7 % — AB (ref 14.0–49.7)
MCH: 29.8 pg (ref 25.1–34.0)
MCHC: 31.4 g/dL — AB (ref 31.5–36.0)
MCV: 94.6 fL (ref 79.5–101.0)
MONO#: 3.2 10*3/uL — ABNORMAL HIGH (ref 0.1–0.9)
MONO%: 20.3 % — AB (ref 0.0–14.0)
NEUT#: 11.8 10*3/uL — ABNORMAL HIGH (ref 1.5–6.5)
NEUT%: 73.9 % (ref 38.4–76.8)
Platelets: 165 10*3/uL (ref 145–400)
RBC: 2.99 10*6/uL — ABNORMAL LOW (ref 3.70–5.45)
RDW: 17.5 % — ABNORMAL HIGH (ref 11.2–14.5)
WBC: 15.9 10*3/uL — ABNORMAL HIGH (ref 3.9–10.3)
nRBC: 1 % — ABNORMAL HIGH (ref 0–0)

## 2015-12-23 LAB — COMPREHENSIVE METABOLIC PANEL
ALBUMIN: 3.4 g/dL — AB (ref 3.5–5.0)
ALT: 18 U/L (ref 14–54)
AST: 16 U/L (ref 15–41)
Alkaline Phosphatase: 47 U/L (ref 38–126)
Anion gap: 9 (ref 5–15)
BILIRUBIN TOTAL: 0.8 mg/dL (ref 0.3–1.2)
BUN: 20 mg/dL (ref 6–20)
CHLORIDE: 108 mmol/L (ref 101–111)
CO2: 26 mmol/L (ref 22–32)
CREATININE: 0.91 mg/dL (ref 0.44–1.00)
Calcium: 9.3 mg/dL (ref 8.9–10.3)
GFR calc Af Amer: >60 mL/min (ref >60)
GLUCOSE: 142 mg/dL — AB (ref 65–99)
Potassium: 4.1 mmol/L (ref 3.5–5.1)
Sodium: 143 mmol/L (ref 135–145)
TOTAL PROTEIN: 7.5 g/dL (ref 6.5–8.1)

## 2015-12-23 MED ORDER — LENALIDOMIDE 25 MG PO CAPS: ORAL_CAPSULE | ORAL | Status: DC

## 2015-12-23 NOTE — Progress Notes (Signed)
[place] [phone]   [phone]  OFFICE PROGRESS NOTE  [name], MD [address] [place] [address]  DIAGNOSIS: Stage IIA IgA kappa multiple myeloma diagnosed in 2006   PRIOR THERAPY:  1) status post treatment with thalidomide and Decadron followed by autologous peripheral blood stem cell transplant with high-dose melphalan on 04/26/2006 at [facility].  2) the patient had evidence for disease recurrence in August of 2008 and she was treated with 6 cycles of Velcade completed on 03/30/2008 with response to the treatment but was discontinued secondary to neuropathy.  3) maintenance treatment with Revlimid 10 mg by mouth daily by the does was later increase to 15 mg by mouth daily secondary to biochemical recurrence with stabilization of her disease. The treatment with Revlimid 15 mg by mouth daily was discontinued secondary to evidence for disease progression.  CURRENT THERAPY:  1) Revlimid 25 mg by mouth daily for 3 weeks every 4 weeks in addition to Decadron currently at 20 mg by mouth on a weekly basis. She is status post 7 cycles. She started cycle #8 on 12/09/2015, placed on hold on 12/16 due to elevated LFTs, now resumed on 12/23/15 2) Zometa 4 mg IV every 3 months. Last dose in 11/04/2015  ADVANCED DIRECTIVE: She does not have advanced directive and was given information.  INTERVAL HISTORY: Darlene Howard 64 y.o. female returns to the clinic today for routine monthly followup visit. She is feeling fine today with no specific complaints. She is tolerating her treatment with Revlimid and Decadron fairly well with no significant adverse effects. She initially began cycle 8 on 12/09/15 through 12/15, with goal of therapy thought 12/29, but this had to be placed on hold due to abnormal liver functions. She returns prior to resuming her treatment. The patient denied having any fever or chills. She denied having any chest  pain, shortness of breath, cough or hemoptysis. She has no weight loss or night sweats. She reports chronic neuropathy related to her diabetes, for which she is to visit her primary care physician soon for readjustment of gabapentin.  MEDICAL HISTORY: Past Medical History  Diagnosis Date  . Glaucoma     uses eye drops as instructed  . Personal history of colon cancer     ASCENDING COLON--  S/P  RIGHT HEMICOLECTOMY WITH NEGATIVE NODES/    NO RECURRENCE  . Herpes     takes Valtrex as needed  . GERD (gastroesophageal reflux disease)     takes Pantoprazole daily  . Hypertension     takes Amlodipine and Diovan daily  . Bruises easily     d/t being on COumadin  . History of colon polyps   . Type 2 diabetes mellitus ([place])   . 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 H/O stem cell transplant ([place])     04-26-2006  AT [facility]  . Multiple myeloma DX  2006  STATE IIA,  IgA  KAPPA    STABLE  PER DR MOHAMED--  S/P STEM CELL TRANSPLANT 2007 /  RECURRENCE 2008  CHEMO ENDED 03-30-2008/   NOW ON MAINTANANCE REVLIMID )  . Vocal cord nodule     ASSESSED BY DR TEOH--  LMA  OK W/ PROCEDURE'S  . DDD (degenerative disc disease), lumbar   . Cholelithiasis   . Fatty liver     ALLERGIES:  is allergic to codeine.  MEDICATIONS:  Current Outpatient Prescriptions  Medication Sig Dispense Refill  . amLODipine (NORVASC) 2.5 MG tablet Take 1 tablet by mouth  daily in the morning 90 tablet 0  . aspirin (ASPIRIN LOW DOSE) 81 MG EC tablet Take 81 mg by mouth daily.      . bimatoprost (LUMIGAN) 0.03 % ophthalmic drops Place 1 drop into both eyes at bedtime.     . calcium-vitamin D (OSCAL 500/200 D-3) 500-200 MG-UNIT per tablet Take 1 tablet by mouth 3 (three) times daily.      . cholecalciferol (VITAMIN D) 1000 UNITS tablet Take 1,000 Units by mouth daily.    Marland Kitchen dexamethasone (DECADRON) 4 MG tablet TAKE 5 TABLETS BY MOUTH ON  A WEEKLY BASIS AS DIRECTED 80  tablet 1  . dorzolamide-timolol (COSOPT) 22.3-6.8 MG/ML ophthalmic solution     . gabapentin (NEURONTIN) 100 MG capsule Take 1 capsule by mouth 3  times daily 270 capsule 0  . loperamide (IMODIUM A-D) 2 MG tablet Take 2 mg by mouth 4 (four) times daily as needed for diarrhea or loose stools.    Marland Kitchen loratadine (CLARITIN) 10 MG tablet TAKE ONE TABLET BY MOUTH ONCE DAILY. 30 tablet 3  . metFORMIN (GLUCOPHAGE) 1000 MG tablet Take 1 tablet by mouth  daily with breakfast 90 tablet 0  . Multiple Vitamins-Minerals (CENTRUM SILVER PO) Take 1 tablet by mouth daily.     . ONE TOUCH ULTRA TEST test strip USE AS DIRECTED TO CHECK DAILY. 50 each 3  . pantoprazole (PROTONIX) 40 MG tablet Take 1 tablet by mouth  daily for acid reflux 90 tablet 1  . potassium chloride SA (K-DUR,KLOR-CON) 20 MEQ tablet Take 1 tablet by mouth  twice a day 180 tablet 0  . PREMARIN vaginal cream PLACE 1 APPLICATORFUL VAGINALLY TWICE A WEEK. 30 g 0  . REVLIMID 25 MG capsule TAKE 1 CAPSULE BY MOUTH EVERY DAY FOR 21 DAYS, 7 DAYS REST 21 capsule 1  . valACYclovir (VALTREX) 1000 MG tablet Take 1 tablet by mouth  daily as needed 90 tablet 1  . valsartan-hydrochlorothiazide (DIOVAN-HCT) 320-25 MG tablet Take 1 tablet by mouth  daily 90 tablet 0  . warfarin (COUMADIN) 2 MG tablet Take 1 tablet by mouth  daily 90 tablet 0   No current facility-administered medications for this visit.    SURGICAL HISTORY:  Past Surgical History  Procedure Laterality Date  . Colonoscopy  10/07/2006    QXI:[id number] rectum/Status post right hemicolectomy. Normal residual colon  . Cervical cone biopsy    . Cataract extraction w/phaco Left 01/20/2014    Procedure: CATARACT EXTRACTION PHACO AND INTRAOCULAR LENS PLACEMENT (IOC);  Surgeon: Adonis Brook, MD;  Location: [place];  Service: Ophthalmology;  Laterality: Left;  . 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 Co2  ablation vulva perianal and vaginal dysplatic areas  02-15-2010  . Abdominal  hysterectomy  1983    w/  appendectomy and left salpingoophorectom  . Hemicolectomy Right 04-27-2005    CARCINOMA   ASCENDING COLON  . Vulvectomy N/A 05/06/2014    Procedure: WIDE LOCAL EXCISION LEFT LABIA MAJORA;  Surgeon: Janie Morning, MD;  Location: [facility];  Service: Gynecology;  Laterality: N/A;  . Vulvar lesion removal N/A 05/06/2014    Procedure: LASER OF VULVAR LESION;  Surgeon: Janie Morning, MD;  Location: [facility];  Service: Gynecology;  Laterality: N/A;  . Lesion removal N/A 05/06/2014    Procedure: LASER OF THE VAGINA;  Surgeon: Janie Morning, MD;  Location: [facility];  Service: Gynecology;  Laterality: N/A;  . Esophagogastroduodenoscopy  04/02/2005    [id number] polyp with central erosion versus ulceration in the body of the stomach, resected and recovered Remainder of the gastric mucosa, D1, D2 appeared normal  . Colonoscopy  10/2009    RMR: diminutive RS polyp (hyperplastic).   . Colonoscopy N/A 12/15/2014    Procedure: COLONOSCOPY;  Surgeon: Daneil Dolin, MD;  [facility];  Service: Endoscopy;  Laterality: N/A;  130     REVIEW OF SYSTEMS:  A comprehensive review of systems was negative except for: Constitutional: positive for fatigue   PHYSICAL EXAMINATION: General appearance: alert, cooperative and no distress Head: Normocephalic, without obvious abnormality, atraumatic Neck: no adenopathy Lymph nodes: Cervical, supraclavicular, and axillary nodes normal. Resp: clear to auscultation bilaterally Back: symmetric, no curvature. ROM normal. No CVA tenderness. Cardio: regular rate and rhythm, S1, S2 normal, no murmur, click, rub or gallop GI: soft, non-tender; bowel sounds normal; no masses,  no organomegaly Extremities: extremities normal, atraumatic, no cyanosis or edema Neurologic: Alert and oriented X 3, normal strength and tone. Normal symmetric reflexes. Normal coordination and gait  ECOG  PERFORMANCE STATUS: 1 - Symptomatic but completely ambulatory  Blood pressure 141/58, pulse 86, temperature 98 F (36.7 C), temperature source Oral, resp. rate 18, height '5\' 6"'  (1.676 m), weight 199 lb 9.6 oz (90.538 kg), SpO2 100 %.  LABORATORY DATA:   CBC Latest Ref Rng 12/23/2015 12/16/2015 11/22/2015  WBC 3.9 - 10.3 10e3/uL 15.9(H) 6.3 10.0  Hemoglobin 11.6 - 15.9 g/dL 8.9(L) 9.6(L) 9.7(L)  Hematocrit 34.8 - 46.6 % 28.3(L) 31.3(L) 31.4(L)  Platelets 145 - 400 10e3/uL 165 Large platelets present 136(L) 141(L)   CMP Latest Ref Rng 12/23/2015 12/16/2015 12/02/2015  Glucose 65 - 99 mg/dL 142(H) 139 134  BUN 6 - 20 mg/dL 20 11.9 16.9  Creatinine 0.44 - 1.00 mg/dL 0.91 1.1 1.0  Sodium 135 - 145 mmol/L 143 139 140  Potassium 3.5 - 5.1 mmol/L 4.1 3.8 3.9  Chloride 101 - 111 mmol/L 108 - -  CO2 22 - 32 mmol/L '26 26 23  ' Calcium 8.9 - 10.3 mg/dL 9.3 9.5 9.7  Total Protein 6.5 - 8.1 g/dL 7.5 7.4 7.3  Total Bilirubin 0.3 - 1.2 mg/dL 0.8 1.71(H) 1.41(H)  Alkaline Phos 38 - 126 U/L 47 59 54  AST 15 - 41 U/L '16 19 13  ' ALT 14 - 54 U/L '18 29 21     ' RADIOGRAPHIC STUDIES: No results found.  ASSESSMENT AND PLAN: This is a very pleasant 64 year old African American female with history of multiple myeloma currently on treatment with Revlimid and low-dose Decadron and tolerated her treatment fairly well. The patient is doing fine today with no specific complaints. Because of disease progression, recommended for her to continue her treatment with Revlimid and Decadron but I increased the dose of Revlimid to 25 mg by mouth daily for 21 days every 4 weeks, status post 7 months of this regimen. She started cycle #8 on 12/09/2015 through 12/29 and then 1 week off. Due to her abnormal bilirubin, her chemo was held as of 12/16. Her bilirubin has now normalized to 0.8. She will resume treatment   She will return in 2 weeks  prior to the start of cycle 9, and repeat compete metabolic panel for close  monitoring of her serum bilirubin.  A new prescription refill for Revlimid is to be called to the  [facility] when needed prior to the next cycle The patient will continue her treatment with Zometa every 3 months as a scheduled, next dose due on 2/4. She was advised to call immediately if she has any concerning symptoms and interval. The patient voices understanding of current disease status and treatment options and is in agreement with the current care plan.  Rondel Jumbo, PA-C   ADDENDUM: Hematology/Oncology Attending: I had a face to face encounter with the patient. I recommended her care plan. This is a very pleasant 64 years old African-American female with relapsed multiple myeloma currently on treatment with Revlimid and Decadron status post 7 cycles and she is currently undergoing cycle #8. Her treatment has been on hold secondary to elevation of the liver enzymes. The patient is feeling fine today and repeat comprehensive metabolic panel showed improvement of her liver enzymes. I recommended for the patient to resume her treatment with Revlimid and Decadron as a scheduled. She will come back for follow-up visit in 2 weeks for reevaluation and repeat blood work. The patient was advised to call immediately if she has any concerning symptoms in the interval.  Disclaimer: This note was dictated with voice recognition software. Similar sounding words can inadvertently be transcribed and may be missed upon review.  Eilleen Kempf., MD 12/23/2015

## 2015-12-27 ENCOUNTER — Telehealth: Payer: Self-pay | Admitting: Internal Medicine

## 2015-12-27 DIAGNOSIS — H2513 Age-related nuclear cataract, bilateral: Secondary | ICD-10-CM | POA: Diagnosis not present

## 2015-12-27 DIAGNOSIS — H401132 Primary open-angle glaucoma, bilateral, moderate stage: Secondary | ICD-10-CM | POA: Diagnosis not present

## 2015-12-27 DIAGNOSIS — H04123 Dry eye syndrome of bilateral lacrimal glands: Secondary | ICD-10-CM | POA: Diagnosis not present

## 2015-12-27 LAB — HM DIABETES EYE EXAM

## 2015-12-27 NOTE — Telephone Encounter (Signed)
Added lab to 1/12 f/u . Left message for patient re add on and new time for 1/12, also confirmed 1/4 lab and mailed new schedule.

## 2016-01-04 ENCOUNTER — Other Ambulatory Visit (HOSPITAL_BASED_OUTPATIENT_CLINIC_OR_DEPARTMENT_OTHER): Payer: Medicare Other

## 2016-01-04 DIAGNOSIS — R945 Abnormal results of liver function studies: Secondary | ICD-10-CM

## 2016-01-04 DIAGNOSIS — C9000 Multiple myeloma not having achieved remission: Secondary | ICD-10-CM

## 2016-01-04 DIAGNOSIS — R7989 Other specified abnormal findings of blood chemistry: Secondary | ICD-10-CM

## 2016-01-04 LAB — COMPREHENSIVE METABOLIC PANEL
ALT: 31 U/L (ref 0–55)
AST: 21 U/L (ref 5–34)
Albumin: 3 g/dL — ABNORMAL LOW (ref 3.5–5.0)
Alkaline Phosphatase: 61 U/L (ref 40–150)
Anion Gap: 9 mEq/L (ref 3–11)
BILIRUBIN TOTAL: 1.44 mg/dL — AB (ref 0.20–1.20)
BUN: 10.6 mg/dL (ref 7.0–26.0)
CO2: 27 meq/L (ref 22–29)
Calcium: 9 mg/dL (ref 8.4–10.4)
Chloride: 104 mEq/L (ref 98–109)
Creatinine: 0.9 mg/dL (ref 0.6–1.1)
EGFR: 79 mL/min/{1.73_m2} — AB (ref >90)
GLUCOSE: 120 mg/dL (ref 70–140)
POTASSIUM: 3.5 meq/L (ref 3.5–5.1)
SODIUM: 140 meq/L (ref 136–145)
TOTAL PROTEIN: 7.2 g/dL (ref 6.4–8.3)

## 2016-01-04 LAB — CBC WITH DIFFERENTIAL/PLATELET
BASO%: 0.4 % (ref 0.0–2.0)
BASOS ABS: 0 10*3/uL (ref 0.0–0.1)
EOS%: 2.9 % (ref 0.0–7.0)
Eosinophils Absolute: 0.2 10*3/uL (ref 0.0–0.5)
HCT: 29.8 % — ABNORMAL LOW (ref 34.8–46.6)
HGB: 9.2 g/dL — ABNORMAL LOW (ref 11.6–15.9)
LYMPH%: 11.9 % — ABNORMAL LOW (ref 14.0–49.7)
MCH: 29.9 pg (ref 25.1–34.0)
MCHC: 30.9 g/dL — ABNORMAL LOW (ref 31.5–36.0)
MCV: 96.8 fL (ref 79.5–101.0)
MONO#: 0.8 10*3/uL (ref 0.1–0.9)
MONO%: 9.6 % (ref 0.0–14.0)
NEUT%: 75.2 % (ref 38.4–76.8)
NEUTROS ABS: 6 10*3/uL (ref 1.5–6.5)
NRBC: 4 % — AB (ref 0–0)
Platelets: 101 10*3/uL — ABNORMAL LOW (ref 145–400)
RBC: 3.08 10*6/uL — AB (ref 3.70–5.45)
RDW: 18.1 % — AB (ref 11.2–14.5)
WBC: 8 10*3/uL (ref 3.9–10.3)
lymph#: 1 10*3/uL (ref 0.9–3.3)

## 2016-01-04 LAB — TECHNOLOGIST REVIEW

## 2016-01-05 DIAGNOSIS — I10 Essential (primary) hypertension: Secondary | ICD-10-CM | POA: Diagnosis not present

## 2016-01-05 DIAGNOSIS — E049 Nontoxic goiter, unspecified: Secondary | ICD-10-CM | POA: Diagnosis not present

## 2016-01-05 DIAGNOSIS — E119 Type 2 diabetes mellitus without complications: Secondary | ICD-10-CM | POA: Diagnosis not present

## 2016-01-05 LAB — COMPLETE METABOLIC PANEL WITH GFR
ALT: 26 U/L (ref 6–29)
AST: 17 U/L (ref 10–35)
Albumin: 3.3 g/dL — ABNORMAL LOW (ref 3.6–5.1)
Alkaline Phosphatase: 49 U/L (ref 33–130)
BILIRUBIN TOTAL: 1.5 mg/dL — AB (ref 0.2–1.2)
BUN: 10 mg/dL (ref 7–25)
CALCIUM: 9.5 mg/dL (ref 8.6–10.4)
CO2: 25 mmol/L (ref 20–31)
CREATININE: 0.85 mg/dL (ref 0.50–0.99)
Chloride: 102 mmol/L (ref 98–110)
GFR, Est African American: 83 mL/min (ref >=60)
GFR, Est Non African American: 72 mL/min (ref >=60)
Glucose, Bld: 136 mg/dL — ABNORMAL HIGH (ref 65–99)
Potassium: 3.4 mmol/L — ABNORMAL LOW (ref 3.5–5.3)
Sodium: 138 mmol/L (ref 135–146)
TOTAL PROTEIN: 6.9 g/dL (ref 6.1–8.1)

## 2016-01-05 LAB — LIPID PANEL
CHOLESTEROL: 116 mg/dL — AB (ref 125–200)
HDL: 44 mg/dL — ABNORMAL LOW (ref >=46)
LDL Cholesterol: 47 mg/dL (ref <130)
Total CHOL/HDL Ratio: 2.6 Ratio (ref <=5.0)
Triglycerides: 124 mg/dL (ref <150)
VLDL: 25 mg/dL (ref <30)

## 2016-01-05 LAB — TSH: TSH: 1.234 u[IU]/mL (ref 0.350–4.500)

## 2016-01-06 LAB — HEMOGLOBIN A1C
Hgb A1c MFr Bld: 6.1 % — ABNORMAL HIGH (ref <5.7)
MEAN PLASMA GLUCOSE: 128 mg/dL — AB (ref <117)

## 2016-01-09 ENCOUNTER — Ambulatory Visit: Payer: Medicare Other | Admitting: Family Medicine

## 2016-01-11 ENCOUNTER — Ambulatory Visit (INDEPENDENT_AMBULATORY_CARE_PROVIDER_SITE_OTHER): Payer: Medicare Other | Admitting: Family Medicine

## 2016-01-11 ENCOUNTER — Encounter: Payer: Self-pay | Admitting: Family Medicine

## 2016-01-11 ENCOUNTER — Other Ambulatory Visit: Payer: Self-pay | Admitting: Gynecologic Oncology

## 2016-01-11 ENCOUNTER — Other Ambulatory Visit: Payer: Self-pay | Admitting: *Deleted

## 2016-01-11 VITALS — BP 118/70 | HR 84 | Resp 18 | Ht 66.0 in | Wt 199.0 lb

## 2016-01-11 DIAGNOSIS — K8020 Calculus of gallbladder without cholecystitis without obstruction: Secondary | ICD-10-CM

## 2016-01-11 DIAGNOSIS — Z85038 Personal history of other malignant neoplasm of large intestine: Secondary | ICD-10-CM

## 2016-01-11 DIAGNOSIS — E119 Type 2 diabetes mellitus without complications: Secondary | ICD-10-CM

## 2016-01-11 DIAGNOSIS — I10 Essential (primary) hypertension: Secondary | ICD-10-CM | POA: Diagnosis not present

## 2016-01-11 DIAGNOSIS — E1169 Type 2 diabetes mellitus with other specified complication: Secondary | ICD-10-CM

## 2016-01-11 DIAGNOSIS — Z1211 Encounter for screening for malignant neoplasm of colon: Secondary | ICD-10-CM

## 2016-01-11 DIAGNOSIS — K219 Gastro-esophageal reflux disease without esophagitis: Secondary | ICD-10-CM

## 2016-01-11 DIAGNOSIS — C9000 Multiple myeloma not having achieved remission: Secondary | ICD-10-CM

## 2016-01-11 DIAGNOSIS — E669 Obesity, unspecified: Secondary | ICD-10-CM

## 2016-01-11 LAB — HEMOCCULT GUIAC POC 1CARD (OFFICE): FECAL OCCULT BLD: NEGATIVE

## 2016-01-11 MED ORDER — METFORMIN HCL 500 MG PO TABS: 500.0000 mg | ORAL_TABLET | Freq: Every day | ORAL | Status: DC

## 2016-01-11 NOTE — Progress Notes (Signed)
Subjective:    Patient ID: Darlene Howard, female    DOB: 1951-12-13, 65 y.o.   MRN: [id number]  HPI   Darlene Howard     MRN: [id number]      DOB: October 01, 1951   HPI Darlene Howard is here for follow up and re-evaluation of chronic medical conditions, medication management and review of any available recent lab and radiology data.  Preventive health is updated, specifically  Cancer screening and Immunization.   Questions or concerns regarding consultations or procedures which the PT has had in the interim are  addressed. The PT denies any adverse reactions to current medications since the last visit.  There are no new concerns.  There are no specific complaints   ROS Denies recent fever or chills. Denies sinus pressure, nasal congestion, ear pain or sore throat. Denies chest congestion, productive cough or wheezing. Denies chest pains, palpitations and leg swelling Denies abdominal pain, nausea, vomiting,diarrhea or constipation.   Denies dysuria, frequency, hesitancy or incontinence. Denies joint pain, swelling and limitation in mobility. Denies headaches, seizures, numbness, or tingling. Denies depression, anxiety or insomnia. Denies skin break down or rash.   PE  BP 118/70 mmHg  Pulse 84  Resp 18  Ht '5\' 6"'$  (1.676 m)  Wt 199 lb (90.266 kg)  BMI 32.13 kg/m2  SpO2 99%  Patient alert and oriented and in no cardiopulmonary distress.  HEENT: No facial asymmetry, EOMI,   oropharynx pink and moist.  Neck supple no JVD, no mass.  Chest: Clear to auscultation bilaterally.  CVS: S1, S2 no murmurs, no S3.Regular rate.  ABD: Soft non tender.No organomegaly  Or mass. Normal bS Rectal:No mass, heme negative stool   Ext: No edema  MS: Adequate ROM spine, shoulders, hips and knees.  Skin: Intact, no ulcerations or rash noted.  Psych: Good eye contact, normal affect. Memory intact not anxious or depressed appearing.  CNS: CN 2-12 intact, power,  normal throughout.no focal  deficits noted.   Assessment & Plan   Diabetes mellitus type 2 in obese (HCC) Controlled,  change in medication reduce dose metformin to half tab daily Darlene Howard is reminded of the importance of commitment to daily physical activity for 30 minutes or more, as able and the need to limit carbohydrate intake to 30 to 60 grams per meal to help with blood sugar control.   The need to take medication as prescribed, test blood sugar as directed, and to call between visits if there is a concern that blood sugar is uncontrolled is also discussed.   Darlene Howard is reminded of the importance of daily foot exam, annual eye examination, and good blood sugar, blood pressure and cholesterol control.  Diabetic Labs Latest Ref Rng 01/26/2016 01/12/2016 01/05/2016 01/04/2016 12/23/2015  HbA1c <5.7 % - - 6.1(H) - -  Microalbumin <2.0 mg/dL - - - - -  Micro/Creat Ratio 0.0 - 30.0 mg/g - - - - -  Chol 125 - 200 mg/dL - - 116(L) - -  HDL >=46 mg/dL - - 44(L) - -  Calc LDL <130 mg/dL - - 47 - -  Triglycerides <150 mg/dL - - 124 - -  Creatinine 0.50 - 0.99 mg/dL 0.76 0.9 0.85 0.9 0.91   BP/Weight 01/26/2016 01/12/2016 01/11/2016 12/23/2015 12/16/2015 12/15/2015 69/05/7892  Systolic BP 810 175 102 585 277 824 235  Diastolic BP 70 59 70 58 53 72 47  Wt. (Lbs) 199.04 200.6 199 199.6 195.3 194.1 197.4  BMI 32.14 32.39 32.13 32.23 31.54  31.34 31.88   Foot/eye exam completion dates Latest Ref Rng 01/04/2016 07/21/2015  Eye Exam No Retinopathy No Retinopathy -  Foot exam Order - - -  Foot Form Completion - - Done             Essential hypertension Controlled, no change in medication DASH diet and commitment to daily physical activity for a minimum of 30 minutes discussed and encouraged, as a part of hypertension management. The importance of attaining a healthy weight is also discussed.  BP/Weight 01/26/2016 01/12/2016 01/11/2016 12/23/2015 12/16/2015 12/15/2015 88/01/8002  Systolic BP 491 791 505 697 122 948 016    Diastolic BP 70 59 70 58 53 72 47  Wt. (Lbs) 199.04 200.6 199 199.6 195.3 194.1 197.4  BMI 32.14 32.39 32.13 32.23 31.54 31.34 31.88        GERD (gastroesophageal reflux disease) Controlled, no change in medication   Cholelithiasis Currently asymptomatic, noted on imaging study  H/O colon cancer, stage I Heme negative stool on exam  OBESITY Unchanged. educated about  the importance of commitment to a  minimum of 150 minutes of exercise per week.  The importance of healthy food choices with portion control discussed. Encouraged to start a food diary, count calories and to consider  joining a support group. Sample diet sheets offered. Goals set by the patient for the next several months.   Weight /BMI 01/26/2016 01/12/2016 01/11/2016  WEIGHT 199 lb 0.6 oz 200 lb 9.6 oz 199 lb  HEIGHT '5\' 6"'$  '5\' 6"'$  '5\' 6"'$   BMI 32.14 kg/m2 32.39 kg/m2 32.13 kg/m2    Current exercise per week 60 minutes.       Review of Systems     Objective:   Physical Exam        Assessment & Plan:

## 2016-01-11 NOTE — Patient Instructions (Addendum)
Annual wellness in 4.5 month, call if you need em sooner  HBas1C, chem 7 and EGFR in 4.5 month  Rectal exam is normal, no blood in stool  REDUCE metformin to 1000 mg HALF daily,   Thanks for choosing [facility], we consider it a privelige to serve you.  All the best for 2017!

## 2016-01-12 ENCOUNTER — Encounter: Payer: Self-pay | Admitting: Internal Medicine

## 2016-01-12 ENCOUNTER — Other Ambulatory Visit (HOSPITAL_BASED_OUTPATIENT_CLINIC_OR_DEPARTMENT_OTHER): Payer: Medicare Other

## 2016-01-12 ENCOUNTER — Telehealth: Payer: Self-pay | Admitting: Internal Medicine

## 2016-01-12 ENCOUNTER — Ambulatory Visit (HOSPITAL_BASED_OUTPATIENT_CLINIC_OR_DEPARTMENT_OTHER): Payer: Medicare Other | Admitting: Internal Medicine

## 2016-01-12 VITALS — BP 125/59 | HR 81 | Temp 98.1°F | Resp 17 | Ht 66.0 in | Wt 200.6 lb

## 2016-01-12 DIAGNOSIS — C9000 Multiple myeloma not having achieved remission: Secondary | ICD-10-CM | POA: Diagnosis not present

## 2016-01-12 DIAGNOSIS — Z9484 Stem cells transplant status: Secondary | ICD-10-CM | POA: Diagnosis not present

## 2016-01-12 DIAGNOSIS — R5383 Other fatigue: Secondary | ICD-10-CM | POA: Diagnosis not present

## 2016-01-12 LAB — CBC WITH DIFFERENTIAL/PLATELET
BASO%: 0.7 % (ref 0.0–2.0)
Basophils Absolute: 0.1 10*3/uL (ref 0.0–0.1)
EOS%: 1.3 % (ref 0.0–7.0)
Eosinophils Absolute: 0.1 10*3/uL (ref 0.0–0.5)
HEMATOCRIT: 28.8 % — AB (ref 34.8–46.6)
HGB: 9 g/dL — ABNORMAL LOW (ref 11.6–15.9)
LYMPH#: 0.8 10*3/uL — AB (ref 0.9–3.3)
LYMPH%: 12 % — AB (ref 14.0–49.7)
MCH: 29.7 pg (ref 25.1–34.0)
MCHC: 31.3 g/dL — AB (ref 31.5–36.0)
MCV: 95 fL (ref 79.5–101.0)
MONO#: 1.1 10*3/uL — AB (ref 0.1–0.9)
MONO%: 15.7 % — ABNORMAL HIGH (ref 0.0–14.0)
NEUT%: 70.3 % (ref 38.4–76.8)
NEUTROS ABS: 4.8 10*3/uL (ref 1.5–6.5)
Platelets: 105 10*3/uL — ABNORMAL LOW (ref 145–400)
RBC: 3.03 10*6/uL — ABNORMAL LOW (ref 3.70–5.45)
RDW: 18 % — ABNORMAL HIGH (ref 11.2–14.5)
WBC: 6.9 10*3/uL (ref 3.9–10.3)
nRBC: 3 % — ABNORMAL HIGH (ref 0–0)

## 2016-01-12 LAB — COMPREHENSIVE METABOLIC PANEL
ALBUMIN: 3 g/dL — AB (ref 3.5–5.0)
ALK PHOS: 55 U/L (ref 40–150)
ALT: 17 U/L (ref 0–55)
AST: 14 U/L (ref 5–34)
Anion Gap: 9 mEq/L (ref 3–11)
BUN: 13.2 mg/dL (ref 7.0–26.0)
CHLORIDE: 108 meq/L (ref 98–109)
CO2: 25 mEq/L (ref 22–29)
Calcium: 8.9 mg/dL (ref 8.4–10.4)
Creatinine: 0.9 mg/dL (ref 0.6–1.1)
EGFR: 80 mL/min/{1.73_m2} — AB (ref >90)
GLUCOSE: 124 mg/dL (ref 70–140)
POTASSIUM: 3.5 meq/L (ref 3.5–5.1)
SODIUM: 142 meq/L (ref 136–145)
Total Bilirubin: 1.29 mg/dL — ABNORMAL HIGH (ref 0.20–1.20)
Total Protein: 7.2 g/dL (ref 6.4–8.3)

## 2016-01-12 NOTE — Telephone Encounter (Signed)
Gave patient avs report and appointments for February. Per patient ok to leave zometa as scheduled for 2/3.

## 2016-01-12 NOTE — Progress Notes (Signed)
[place] Telephone:([phone]) [phone]   Fax:(336) [phone]  OFFICE PROGRESS NOTE  Darlene Nakayama, MD [address] [address]  DIAGNOSIS: Stage IIA IgA kappa multiple myeloma diagnosed in 2006   PRIOR THERAPY:  1) status post treatment with thalidomide and Decadron followed by autologous peripheral blood stem cell transplant with high-dose melphalan on 04/26/2006 at [facility].  2) the patient had evidence for disease recurrence in August of 2008 and she was treated with 6 cycles of Velcade completed on 03/30/2008 with response to the treatment but was discontinued secondary to neuropathy.  3) maintenance treatment with Revlimid 10 mg by mouth daily by the does was later increase to 15 mg by mouth daily secondary to biochemical recurrence with stabilization of her disease. The treatment with Revlimid 15 mg by mouth daily was discontinued secondary to evidence for disease progression.  CURRENT THERAPY:  1) Revlimid 25 mg by mouth daily for 3 weeks every 4 weeks in addition to Decadron currently at 20 mg by mouth on a weekly basis. She is status post 8 cycles. She started cycle #9 on 01/13/2016 2) Zometa 4 mg IV every 3 months. Next dose in 05/05/2015  ADVANCED DIRECTIVE: She does not have advanced directive and was given information.  INTERVAL HISTORY: Darlene Howard 65 y.o. female returns to the clinic today for routine monthly followup visit. She is feeling fine today with no specific complaints. She is tolerating her treatment with Revlimid and Decadron fairly well with no significant adverse effects. She status post 8 cycles. Her treatment was on hold for a few weeks secondary to elevated serum bilirubin. The patient denied having any fever or chills. She denied having any chest pain, shortness of breath, cough or hemoptysis. She has no weight loss or night sweats. She had repeat CBC and comprehensive metabolic panel performed earlier today  and she is here for evaluation and discussion of her lab results.   MEDICAL HISTORY: Past Medical History  Diagnosis Date  . Glaucoma     uses eye drops as instructed  . Personal history of colon cancer     ASCENDING COLON--  S/P  RIGHT HEMICOLECTOMY WITH NEGATIVE NODES/    NO RECURRENCE  . Herpes     takes Valtrex as needed  . GERD (gastroesophageal reflux disease)     takes Pantoprazole daily  . Hypertension     takes Amlodipine and Diovan daily  . Bruises easily     d/t being on COumadin  . History of colon polyps   . Type 2 diabetes mellitus ([place])   . 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 H/O stem cell transplant ([place])     04-26-2006  AT [facility]  . Multiple myeloma DX  2006  STATE IIA,  IgA  KAPPA    STABLE  PER DR Conleigh Heinlein--  S/P STEM CELL TRANSPLANT 2007 /  RECURRENCE 2008  CHEMO ENDED 03-30-2008/   NOW ON MAINTANANCE REVLIMID )  . Vocal cord nodule     ASSESSED BY DR TEOH--  LMA  OK W/ PROCEDURE'S  . DDD (degenerative disc disease), lumbar   . Cholelithiasis   . Fatty liver     ALLERGIES:  is allergic to codeine.  MEDICATIONS:  Current Outpatient Prescriptions  Medication Sig Dispense Refill  . amLODipine (NORVASC) 2.5 MG tablet Take 1 tablet by mouth  daily in the morning 90  tablet 0  . aspirin (ASPIRIN LOW DOSE) 81 MG EC tablet Take 81 mg by mouth daily.      . calcium-vitamin D (OSCAL 500/200 D-3) 500-200 MG-UNIT per tablet Take 1 tablet by mouth 3 (three) times daily.      . cholecalciferol (VITAMIN D) 1000 UNITS tablet Take 1,000 Units by mouth daily.    Marland Kitchen dexamethasone (DECADRON) 4 MG tablet TAKE 5 TABLETS BY MOUTH ON  A WEEKLY BASIS AS DIRECTED 80 tablet 1  . dorzolamide-timolol (COSOPT) 22.3-6.8 MG/ML ophthalmic solution     . gabapentin (NEURONTIN) 100 MG capsule Take 1 capsule by mouth 3  times daily 270 capsule 0  . lenalidomide (REVLIMID) 25 MG capsule TAKE 1 CAPSULE BY MOUTH EVERY DAY FOR 21  DAYS, 7 DAYS REST Adult female not of childbearing potential. Auth number [id number] 12/23/15 21 capsule 1  . loratadine (CLARITIN) 10 MG tablet TAKE ONE TABLET BY MOUTH ONCE DAILY. 30 tablet 3  . LUMIGAN 0.01 % SOLN     . metFORMIN (GLUCOPHAGE) 500 MG tablet Take 1 tablet (500 mg total) by mouth daily with breakfast. 90 tablet 3  . Multiple Vitamins-Minerals (CENTRUM SILVER PO) Take 1 tablet by mouth daily.     . ONE TOUCH ULTRA TEST test strip USE AS DIRECTED TO CHECK DAILY. 50 each 3  . pantoprazole (PROTONIX) 40 MG tablet Take 1 tablet by mouth  daily for acid reflux 90 tablet 1  . potassium chloride SA (K-DUR,KLOR-CON) 20 MEQ tablet Take 1 tablet by mouth  twice a day 180 tablet 0  . PREMARIN vaginal cream PLACE 1 APPLICATORFUL VAGINALLY TWICE A WEEK. 30 g 0  . valACYclovir (VALTREX) 1000 MG tablet Take 1 tablet by mouth  daily as needed 90 tablet 1  . valsartan-hydrochlorothiazide (DIOVAN-HCT) 320-25 MG tablet Take 1 tablet by mouth  daily 90 tablet 0  . warfarin (COUMADIN) 2 MG tablet Take 1 tablet by mouth  daily 90 tablet 0   No current facility-administered medications for this visit.    SURGICAL HISTORY:  Past Surgical History  Procedure Laterality Date  . Colonoscopy  10/07/2006    INO:[facility] rectum/Status post right hemicolectomy. Normal residual colon  . Cervical cone biopsy    . Cataract extraction w/phaco Left 01/20/2014    Procedure: CATARACT EXTRACTION PHACO AND INTRAOCULAR LENS PLACEMENT (IOC);  Surgeon: Adonis Brook, MD;  Location: [facility];  Service: Ophthalmology;  Laterality: Left;  . 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 Co2  ablation vulva perianal and vaginal dysplatic areas  02-15-2010  . Abdominal hysterectomy  1983    w/  appendectomy and left salpingoophorectom  . Hemicolectomy Right 04-27-2005    CARCINOMA   ASCENDING COLON  . Vulvectomy N/A 05/06/2014    Procedure: WIDE LOCAL EXCISION LEFT LABIA MAJORA;  Surgeon: Janie Morning, MD;  Location:  [facility];  Service: Gynecology;  Laterality: N/A;  . Vulvar lesion removal N/A 05/06/2014    Procedure: LASER OF VULVAR LESION;  Surgeon: Janie Morning, MD;  Location: [facility];  Service: Gynecology;  Laterality: N/A;  . Lesion removal N/A 05/06/2014    Procedure: LASER OF THE VAGINA;  Surgeon: Janie Morning, MD;  Location: [facility];  Service: Gynecology;  Laterality: N/A;  . Esophagogastroduodenoscopy  04/02/2005    NOB:[id number] polyp with central erosion versus ulceration in the body of the stomach, resected and recovered Remainder of the gastric mucosa, D1, D2 appeared normal  .  Colonoscopy  10/2009    RMR: diminutive RS polyp (hyperplastic).   . Colonoscopy N/A 12/15/2014    Procedure: COLONOSCOPY;  Surgeon: Daneil Dolin, MD;  [facility];  Service: Endoscopy;  Laterality: N/A;  130     REVIEW OF SYSTEMS:  A comprehensive review of systems was negative except for: Constitutional: positive for fatigue   PHYSICAL EXAMINATION: General appearance: alert, cooperative and no distress Head: Normocephalic, without obvious abnormality, atraumatic Neck: no adenopathy Lymph nodes: Cervical, supraclavicular, and axillary nodes normal. Resp: clear to auscultation bilaterally Back: symmetric, no curvature. ROM normal. No CVA tenderness. Cardio: regular rate and rhythm, S1, S2 normal, no murmur, click, rub or gallop GI: soft, non-tender; bowel sounds normal; no masses,  no organomegaly Extremities: extremities normal, atraumatic, no cyanosis or edema Neurologic: Alert and oriented X 3, normal strength and tone. Normal symmetric reflexes. Normal coordination and gait  ECOG PERFORMANCE STATUS: 1 - Symptomatic but completely ambulatory  Blood pressure 125/59, pulse 81, temperature 98.1 F (36.7 C), temperature source Oral, resp. rate 17, height _0  (1.676 m), weight 200 lb 9.6 oz (90.992 kg), SpO2 100 %.  LABORATORY  DATA: Lab Results  Component Value Date   WBC 6.9 01/12/2016   HGB 9.0* 01/12/2016   HCT 28.8* 01/12/2016   MCV 95.0 01/12/2016   PLT 105* 01/12/2016      Chemistry      Component Value Date/Time   NA 142 01/12/2016 1130   NA 138 01/05/2016 0954   K 3.5 01/12/2016 1130   K 3.4* 01/05/2016 0954   CL 102 01/05/2016 0954   CL 105 06/17/2013 1441   CO2 25 01/12/2016 1130   CO2 25 01/05/2016 0954   BUN 13.2 01/12/2016 1130   BUN 10 01/05/2016 0954   CREATININE 0.9 01/12/2016 1130   CREATININE 0.85 01/05/2016 0954   CREATININE 0.91 12/23/2015 0949      Component Value Date/Time   CALCIUM 9.5 01/05/2016 0954   CALCIUM 9.0 01/04/2016 1336   ALKPHOS 49 01/05/2016 0954   ALKPHOS 61 01/04/2016 1336   AST 17 01/05/2016 0954   AST 21 01/04/2016 1336   ALT 26 01/05/2016 0954   ALT 31 01/04/2016 1336   BILITOT 1.5* 01/05/2016 0954   BILITOT 1.44* 01/04/2016 1336       RADIOGRAPHIC STUDIES: No results found.  ASSESSMENT AND PLAN: This is a very pleasant 65 years old [place] American female with history of multiple myeloma currently on treatment with Revlimid and low-dose Decadron and tolerated her treatment fairly well. The patient is doing fine today with no specific complaints except for mild fatigue.  Because of disease progression, I recommended for her to continue her treatment with Revlimid and Decadron but I increased the dose of Revlimid to 25 mg by mouth daily for 21 days every 4 weeks, status post 8 months of this regimen. She started cycle #9 tomorrow. Her lab work is good enough for her to resume her treatment tomorrow as a scheduled. I discussed the lab result with the patient and recommended for her to continue her treatment as scheduled. She will come back for follow-up visit in one month's for reevaluation after repeating myeloma panel. The patient will continue her treatment with Zometa every 3 months as a scheduled. She was advised to call immediately if she has  any concerning symptoms and interval. The patient voices understanding of current disease status and treatment options and is in agreement with the current care plan.  All questions  were answered. The patient knows to call the clinic with any problems, questions or concerns. We can certainly see the patient much sooner if necessary.  Disclaimer: This note was dictated with voice recognition software. Similar sounding words can inadvertently be transcribed and may not be corrected upon review.

## 2016-01-13 ENCOUNTER — Other Ambulatory Visit: Payer: Self-pay | Admitting: Family Medicine

## 2016-01-16 ENCOUNTER — Other Ambulatory Visit: Payer: Self-pay | Admitting: Internal Medicine

## 2016-01-19 ENCOUNTER — Other Ambulatory Visit: Payer: Self-pay | Admitting: Medical Oncology

## 2016-01-19 DIAGNOSIS — C9000 Multiple myeloma not having achieved remission: Secondary | ICD-10-CM

## 2016-01-19 MED ORDER — LENALIDOMIDE 25 MG PO CAPS: ORAL_CAPSULE | ORAL | Status: DC

## 2016-01-19 NOTE — Progress Notes (Signed)
refaxed revlimid with auth number and required info.Marland Kitchen

## 2016-01-23 ENCOUNTER — Other Ambulatory Visit: Payer: Self-pay | Admitting: Family Medicine

## 2016-01-26 ENCOUNTER — Encounter: Payer: Self-pay | Admitting: Family Medicine

## 2016-01-26 ENCOUNTER — Ambulatory Visit (INDEPENDENT_AMBULATORY_CARE_PROVIDER_SITE_OTHER): Payer: Medicare Other | Admitting: Family Medicine

## 2016-01-26 VITALS — BP 130/70 | HR 76 | Resp 18 | Ht 66.0 in | Wt 199.0 lb

## 2016-01-26 DIAGNOSIS — E119 Type 2 diabetes mellitus without complications: Secondary | ICD-10-CM

## 2016-01-26 DIAGNOSIS — E669 Obesity, unspecified: Secondary | ICD-10-CM

## 2016-01-26 DIAGNOSIS — R1013 Epigastric pain: Secondary | ICD-10-CM | POA: Diagnosis not present

## 2016-01-26 DIAGNOSIS — K8010 Calculus of gallbladder with chronic cholecystitis without obstruction: Secondary | ICD-10-CM | POA: Diagnosis not present

## 2016-01-26 DIAGNOSIS — I10 Essential (primary) hypertension: Secondary | ICD-10-CM

## 2016-01-26 DIAGNOSIS — E1169 Type 2 diabetes mellitus with other specified complication: Secondary | ICD-10-CM

## 2016-01-26 DIAGNOSIS — K8020 Calculus of gallbladder without cholecystitis without obstruction: Secondary | ICD-10-CM | POA: Insufficient documentation

## 2016-01-26 MED ORDER — TRAMADOL HCL 50 MG PO TABS: 50.0000 mg | ORAL_TABLET | Freq: Three times a day (TID) | ORAL | Status: DC | PRN

## 2016-01-26 NOTE — Patient Instructions (Signed)
F/u as before  I believe that your abdominal pain is due to the gallstones that you have had for past 10 days . If this worsens or you develop fevr i or chills go directly to the ED  You need to see a surgeon as soon as possible for further management.You are referred on an urgent basis, call tomorrow by 10 am for appointment info if you have not heard from [place].  AVOID fatty foods and foods that cause  Pain to worse  Labs today  Tramadol is prescribed for painIF YOU NEED IT, AND CAN TOLERATE IT. It is a cousin to hydrocodone which makes you nauseated

## 2016-01-26 NOTE — Progress Notes (Signed)
   Subjective:    Patient ID: Darlene Howard, female    DOB: 13-Jun-1951, 65 y.o.   MRN: [id number]  HPI 10 day h/o RUQ pain radiating to back, aggravated by eating, especially fatty foods. Rated at a 10, a lot of bloating and some relief with belching. No fever, chills, vomit or loose stool Established cholelithiasis sonographically  Review of Systems See HPI Denies recent fever or chills. Denies sinus pressure, nasal congestion, ear pain or sore throat. Denies chest congestion, productive cough or wheezing. Denies chest pains, palpitations and leg swelling  Denies dysuria, frequency, hesitancy or incontinence. Denies uncontrolled  joint pain, swelling and limitation in mobility. Denies headaches, seizures, numbness, or tingling. Denies depression, anxiety or insomnia. Denies skin break down or rash.        Objective:   Physical Exam  BP 130/70 mmHg  Pulse 76  Resp 18  Ht '5\' 6"'$  (1.676 m)  Wt 199 lb 0.6 oz (90.284 kg)  BMI 32.14 kg/m2  SpO2 99% Patient alert and oriented and in no cardiopulmonary distress.Pt uncomfortable no current severe pain, but 10 day h/o intermittent severe pain. eck supple no JVD, no mass.  Chest: Clear to auscultation bilaterally.  CVS: S1, S2 no murmurs, no S3.Regular rate.  ABD: Soft RUQ tenderness to deep palpation, no guarding orrebound, normal BS.  Ext: No edema  MS: Adequate ROM spine, shoulders, hips and knees.  Skin: Intact, no ulcerations or rash noted.  Psych: Good eye contact, normal affect. Memory intact not anxious or depressed appearing.  CNS: CN 2-12 intact, power,  normal throughout.no focal deficits noted.       Assessment & Plan:  Cholelithiasis 10 day h/o recurrent RUQ pain rated at a 10 with food, radiating around to posterior right chest. Established cholelithiasis, now symptomatic, needs urgent surgery evaluation Baseline labs today to include amylase, lipase and hepatic panel and CBC  Essential  hypertension Controlled, no change in medication  BP/Weight 01/26/2016 01/12/2016 01/11/2016 12/23/2015 12/16/2015 12/15/2015 16/08/4502  Systolic BP 888 280 034 917 915 056 979  Diastolic BP 70 59 70 58 53 72 47  Wt. (Lbs) 199.04 200.6 199 199.6 195.3 194.1 197.4  BMI 32.14 32.39 32.13 32.23 31.54 31.34 31.88        Diabetes mellitus type 2 in obese (HCC) Controlled, no change in medication   Diabetic Labs Latest Ref Rng 01/26/2016 01/12/2016 01/05/2016 01/04/2016 12/23/2015  HbA1c <5.7 % - - 6.1(H) - -  Microalbumin <2.0 mg/dL - - - - -  Micro/Creat Ratio 0.0 - 30.0 mg/g - - - - -  Chol 125 - 200 mg/dL - - 116(L) - -  HDL >=46 mg/dL - - 44(L) - -  Calc LDL <130 mg/dL - - 47 - -  Triglycerides <150 mg/dL - - 124 - -  Creatinine 0.50 - 0.99 mg/dL 0.76 0.9 0.85 0.9 0.91   BP/Weight 01/26/2016 01/12/2016 01/11/2016 12/23/2015 12/16/2015 12/15/2015 48/0/1655  Systolic BP 374 827 078 675 449 201 007  Diastolic BP 70 59 70 58 53 72 47  Wt. (Lbs) 199.04 200.6 199 199.6 195.3 194.1 197.4  BMI 32.14 32.39 32.13 32.23 31.54 31.34 31.88   Foot/eye exam completion dates Latest Ref Rng 01/04/2016 07/21/2015  Eye Exam No Retinopathy No Retinopathy -  Foot exam Order - - -  Foot Form Completion - - Done

## 2016-01-27 ENCOUNTER — Ambulatory Visit: Payer: Medicare Other

## 2016-01-27 LAB — COMPREHENSIVE METABOLIC PANEL
ALK PHOS: 52 U/L (ref 33–130)
ALT: 14 U/L (ref 6–29)
AST: 14 U/L (ref 10–35)
Albumin: 3.3 g/dL — ABNORMAL LOW (ref 3.6–5.1)
BILIRUBIN TOTAL: 1.2 mg/dL (ref 0.2–1.2)
BUN: 7 mg/dL (ref 7–25)
CALCIUM: 9.6 mg/dL (ref 8.6–10.4)
CO2: 30 mmol/L (ref 20–31)
CREATININE: 0.76 mg/dL (ref 0.50–0.99)
Chloride: 98 mmol/L (ref 98–110)
GLUCOSE: 102 mg/dL — AB (ref 65–99)
Potassium: 3.5 mmol/L (ref 3.5–5.3)
SODIUM: 143 mmol/L (ref 135–146)
Total Protein: 7.1 g/dL (ref 6.1–8.1)

## 2016-01-27 LAB — CBC WITH DIFFERENTIAL/PLATELET
BASOS ABS: 0.1 10*3/uL (ref 0.0–0.1)
BASOS PCT: 1 % (ref 0–1)
EOS PCT: 3 % (ref 0–5)
Eosinophils Absolute: 0.3 10*3/uL (ref 0.0–0.7)
HEMATOCRIT: 30.7 % — AB (ref 36.0–46.0)
Hemoglobin: 9.6 g/dL — ABNORMAL LOW (ref 12.0–15.0)
LYMPHS PCT: 15 % (ref 12–46)
Lymphs Abs: 1.3 10*3/uL (ref 0.7–4.0)
MCH: 29.7 pg (ref 26.0–34.0)
MCHC: 31.3 g/dL (ref 30.0–36.0)
MCV: 95 fL (ref 78.0–100.0)
MPV: 12.8 fL — ABNORMAL HIGH (ref 8.6–12.4)
Monocytes Absolute: 1 10*3/uL (ref 0.1–1.0)
Monocytes Relative: 11 % (ref 3–12)
NEUTROS ABS: 6.1 10*3/uL (ref 1.7–7.7)
Neutrophils Relative %: 70 % (ref 43–77)
Platelets: 157 10*3/uL (ref 150–400)
RBC: 3.23 MIL/uL — AB (ref 3.87–5.11)
RDW: 18.9 % — AB (ref 11.5–15.5)
WBC: 8.7 10*3/uL (ref 4.0–10.5)

## 2016-01-27 LAB — AMYLASE: Amylase: 102 U/L (ref 0–105)

## 2016-01-27 LAB — LIPASE: Lipase: 76 U/L — ABNORMAL HIGH (ref 7–60)

## 2016-01-27 NOTE — Assessment & Plan Note (Signed)
Heme negative stool on exam

## 2016-01-27 NOTE — Assessment & Plan Note (Addendum)
Controlled,  change in medication reduce dose metformin to half tab daily Darlene Howard is reminded of the importance of commitment to daily physical activity for 30 minutes or more, as able and the need to limit carbohydrate intake to 30 to 60 grams per meal to help with blood sugar control.   The need to take medication as prescribed, test blood sugar as directed, and to call between visits if there is a concern that blood sugar is uncontrolled is also discussed.   Darlene Howard is reminded of the importance of daily foot exam, annual eye examination, and good blood sugar, blood pressure and cholesterol control.  Diabetic Labs Latest Ref Rng 01/26/2016 01/12/2016 01/05/2016 01/04/2016 12/23/2015  HbA1c <5.7 % - - 6.1(H) - -  Microalbumin <2.0 mg/dL - - - - -  Micro/Creat Ratio 0.0 - 30.0 mg/g - - - - -  Chol 125 - 200 mg/dL - - 116(L) - -  HDL >=46 mg/dL - - 44(L) - -  Calc LDL <130 mg/dL - - 47 - -  Triglycerides <150 mg/dL - - 124 - -  Creatinine 0.50 - 0.99 mg/dL 0.76 0.9 0.85 0.9 0.91   BP/Weight 01/26/2016 01/12/2016 01/11/2016 12/23/2015 12/16/2015 12/15/2015 45/07/997  Systolic BP 338 250 539 767 341 937 902  Diastolic BP 70 59 70 58 53 72 47  Wt. (Lbs) 199.04 200.6 199 199.6 195.3 194.1 197.4  BMI 32.14 32.39 32.13 32.23 31.54 31.34 31.88   Foot/eye exam completion dates Latest Ref Rng 01/04/2016 07/21/2015  Eye Exam No Retinopathy No Retinopathy -  Foot exam Order - - -  Foot Form Completion - - Done

## 2016-01-27 NOTE — Assessment & Plan Note (Signed)
Controlled, no change in medication DASH diet and commitment to daily physical activity for a minimum of 30 minutes discussed and encouraged, as a part of hypertension management. The importance of attaining a healthy weight is also discussed.  BP/Weight 01/26/2016 01/12/2016 01/11/2016 12/23/2015 12/16/2015 12/15/2015 88/12/1029  Systolic BP 594 585 929 244 628 638 177  Diastolic BP 70 59 70 58 53 72 47  Wt. (Lbs) 199.04 200.6 199 199.6 195.3 194.1 197.4  BMI 32.14 32.39 32.13 32.23 31.54 31.34 31.88

## 2016-01-27 NOTE — Assessment & Plan Note (Signed)
Controlled, no change in medication  

## 2016-01-27 NOTE — Assessment & Plan Note (Signed)
Controlled, no change in medication  BP/Weight 01/26/2016 01/12/2016 01/11/2016 12/23/2015 12/16/2015 12/15/2015 37/08/4326  Systolic BP 614 709 295 747 340 370 964  Diastolic BP 70 59 70 58 53 72 47  Wt. (Lbs) 199.04 200.6 199 199.6 195.3 194.1 197.4  BMI 32.14 32.39 32.13 32.23 31.54 31.34 31.88

## 2016-01-27 NOTE — Assessment & Plan Note (Signed)
Currently asymptomatic, noted on imaging study

## 2016-01-27 NOTE — Assessment & Plan Note (Signed)
10 day h/o recurrent RUQ pain rated at a 10 with food, radiating around to posterior right chest. Established cholelithiasis, now symptomatic, needs urgent surgery evaluation Baseline labs today to include amylase, lipase and hepatic panel and CBC

## 2016-01-27 NOTE — Assessment & Plan Note (Signed)
Unchanged. educated about  the importance of commitment to a  minimum of 150 minutes of exercise per week.  The importance of healthy food choices with portion control discussed. Encouraged to start a food diary, count calories and to consider  joining a support group. Sample diet sheets offered. Goals set by the patient for the next several months.   Weight /BMI 01/26/2016 01/12/2016 01/11/2016  WEIGHT 199 lb 0.6 oz 200 lb 9.6 oz 199 lb  HEIGHT '5\' 6"'$  '5\' 6"'$  '5\' 6"'$   BMI 32.14 kg/m2 32.39 kg/m2 32.13 kg/m2    Current exercise per week 60 minutes.

## 2016-01-27 NOTE — Assessment & Plan Note (Signed)
Controlled, no change in medication   Diabetic Labs Latest Ref Rng 01/26/2016 01/12/2016 01/05/2016 01/04/2016 12/23/2015  HbA1c <5.7 % - - 6.1(H) - -  Microalbumin <2.0 mg/dL - - - - -  Micro/Creat Ratio 0.0 - 30.0 mg/g - - - - -  Chol 125 - 200 mg/dL - - 116(L) - -  HDL >=46 mg/dL - - 44(L) - -  Calc LDL <130 mg/dL - - 47 - -  Triglycerides <150 mg/dL - - 124 - -  Creatinine 0.50 - 0.99 mg/dL 0.76 0.9 0.85 0.9 0.91   BP/Weight 01/26/2016 01/12/2016 01/11/2016 12/23/2015 12/16/2015 12/15/2015 68/03/4195  Systolic BP 222 979 892 119 417 408 144  Diastolic BP 70 59 70 58 53 72 47  Wt. (Lbs) 199.04 200.6 199 199.6 195.3 194.1 197.4  BMI 32.14 32.39 32.13 32.23 31.54 31.34 31.88   Foot/eye exam completion dates Latest Ref Rng 01/04/2016 07/21/2015  Eye Exam No Retinopathy No Retinopathy -  Foot exam Order - - -  Foot Form Completion - - Done

## 2016-02-03 ENCOUNTER — Other Ambulatory Visit: Payer: Self-pay | Admitting: Oncology

## 2016-02-03 ENCOUNTER — Ambulatory Visit (HOSPITAL_BASED_OUTPATIENT_CLINIC_OR_DEPARTMENT_OTHER): Payer: Medicare Other

## 2016-02-03 VITALS — BP 112/40 | HR 83 | Temp 97.9°F | Resp 20

## 2016-02-03 DIAGNOSIS — C9000 Multiple myeloma not having achieved remission: Secondary | ICD-10-CM

## 2016-02-03 MED ORDER — ZOLEDRONIC ACID 4 MG/100ML IV SOLN
4.0000 mg | Freq: Once | INTRAVENOUS | Status: DC
  Filled 2016-02-03: qty 100

## 2016-02-03 MED ORDER — ZOLEDRONIC ACID 4 MG/100ML IV SOLN
4.0000 mg | Freq: Once | INTRAVENOUS | Status: AC
  Administered 2016-02-03: 4 mg via INTRAVENOUS
  Filled 2016-02-03: qty 100

## 2016-02-03 NOTE — Patient Instructions (Signed)

## 2016-02-04 ENCOUNTER — Telehealth: Payer: Self-pay | Admitting: Internal Medicine

## 2016-02-04 NOTE — Telephone Encounter (Signed)
Due to PAL per MM moved 2/7 appointments to 2/10. Left message for patient and mailed schedule.

## 2016-02-06 ENCOUNTER — Telehealth: Payer: Self-pay | Admitting: Internal Medicine

## 2016-02-06 NOTE — Telephone Encounter (Signed)
Returned call to patient this morning to confirm appoint change date/time per MM schedule change

## 2016-02-07 ENCOUNTER — Ambulatory Visit: Payer: Medicare Other | Admitting: Internal Medicine

## 2016-02-07 ENCOUNTER — Other Ambulatory Visit: Payer: Medicare Other

## 2016-02-09 ENCOUNTER — Ambulatory Visit: Payer: Self-pay | Admitting: General Surgery

## 2016-02-09 DIAGNOSIS — K8020 Calculus of gallbladder without cholecystitis without obstruction: Secondary | ICD-10-CM | POA: Diagnosis not present

## 2016-02-09 NOTE — H&P (Signed)
History of Present Illness Darlene Ok MD; 02/09/2016 10:49 AM) Patient words: GB.  The patient is a 65 year old female who presents for evaluation of gall stones. The patient is a 65 year old female who is referred by Dr. [name] for an evaluation of symptomatic cholelithiasis. Patient states that over the last 3-4 months she is experienced epigastric/right upper quadrant pain that radiates to the back. She states that she noticed fried/high fatty foods cause the pain. Patient underwent ultrasound in the past, March 2015, which revealed multiple gallstones. Patient states she currently takes medication for her multiple myeloma. During the cycle she has had experiences of elevated LFTs.   Problem List/Past Medical Darlene Howard, [place]; 02/09/2016 10:35 AM) MULTIPLE MYELOMA (C90.00)  Other Problems Darlene Howard, CMA; 02/09/2016 10:35 AM) Arthritis Colon Cancer Diabetes Mellitus Gastroesophageal Reflux Disease High blood pressure  Past Surgical History Darlene Howard, [place]; 02/09/2016 10:34 AM) Colon Removal - Partial Hysterectomy (not due to cancer) - Partial  Diagnostic Studies History Darlene Howard, [place]; 02/09/2016 10:34 AM) Colonoscopy 1-5 years ago Mammogram within last year Pap Smear 1-5 years ago  Allergies Darlene Howard, CMA; 02/09/2016 10:35 AM) Codeine Sulfate *ANALGESICS - OPIOID*  Medication History Darlene Howard, CMA; 02/09/2016 10:40 AM) AmLODIPine Besylate (2.5MG Tablet, Oral) Active. Lumigan (0.01% Solution, Ophthalmic) Active. Dorzolamide HCl-Timolol Mal (22.3-6.8MG/ML Solution, Ophthalmic) Active. Gabapentin (100MG Capsule, Oral) Active. Premarin (0.625MG/GM Cream, Vaginal) Active. Revlimid (25MG Capsule, Oral) Active. MetFORMIN HCl (1000MG Tablet, Oral) Active. Pantoprazole Sodium (40MG Tablet DR, Oral) Active. Potassium Chloride Crys ER (20MEQ Tablet ER, Oral) Active. ValACYclovir HCl (1GM Tablet, Oral)  Active. Valsartan-Hydrochlorothiazide (320-25MG Tablet, Oral) Active. Warfarin Sodium (2MG Tablet, Oral) Active. Aspirin (81MG Tablet DR, Oral) Active. Calcium Carbonate-Vitamin D (500-200MG-UNIT Tablet, Oral) Active. Vitamin D (1000UNIT Tablet, Oral) Active. Dexamethasone (4MG Tablet, Oral) Active. Loratadine (10MG Tablet, Oral) Active. Multi Vitamin (Oral) Active. Imodium (2MG Capsule, Oral) Active. Tylenol (325MG Tablet, Oral) Active. Medications Reconciled  Social History Darlene Howard, [place]; 02/09/2016 10:34 AM) Caffeine use Carbonated beverages, Tea. No alcohol use No drug use Tobacco use Never smoker.  Family History Darlene Howard, [place]; 02/09/2016 10:34 AM) Arthritis Father, Mother. Hypertension Father, Mother. Kidney Disease Mother.  Pregnancy / Birth History Darlene Howard, [place]; 02/09/2016 10:34 AM) Age at menarche 66 years. Age of menopause <43 Gravida 0 Para 0    Review of Systems Darlene Howard CMA; 02/09/2016 10:34 AM) General Not Present- Appetite Loss, Chills, Fatigue, Fever, Night Sweats, Weight Gain and Weight Loss. Skin Not Present- Change in Wart/Mole, Dryness, Hives, Jaundice, New Lesions, Non-Healing Wounds, Rash and Ulcer. HEENT Not Present- Earache, Hearing Loss, Hoarseness, Nose Bleed, Oral Ulcers, Ringing in the Ears, Seasonal Allergies, Sinus Pain, Sore Throat, Visual Disturbances, Wears glasses/contact lenses and Yellow Eyes. Respiratory Not Present- Bloody sputum, Chronic Cough, Difficulty Breathing, Snoring and Wheezing. Breast Not Present- Breast Mass, Breast Pain, Nipple Discharge and Skin Changes. Cardiovascular Not Present- Chest Pain, Difficulty Breathing Lying Down, Leg Cramps, Palpitations, Rapid Heart Rate, Shortness of Breath and Swelling of Extremities. Gastrointestinal Present- Abdominal Pain, Excessive gas and Indigestion. Not Present- Bloating, Bloody Stool, Change in Bowel Habits, Chronic diarrhea,  Constipation, Difficulty Swallowing, Gets full quickly at meals, Hemorrhoids, Nausea, Rectal Pain and Vomiting. Female Genitourinary Not Present- Frequency, Nocturia, Painful Urination, Pelvic Pain and Urgency. Musculoskeletal Not Present- Back Pain, Joint Pain, Joint Stiffness, Muscle Pain, Muscle Weakness and Swelling of Extremities. Neurological Not Present- Decreased Memory, Fainting, Headaches, Numbness, Seizures, Tingling, Tremor, Trouble walking and  Weakness. Psychiatric Not Present- Anxiety, Bipolar, Change in Sleep Pattern, Depression, Fearful and Frequent crying. Endocrine Not Present- Cold Intolerance, Excessive Hunger, Hair Changes, Heat Intolerance, Hot flashes and New Diabetes. Hematology Present- Easy Bruising. Not Present- Excessive bleeding, Gland problems, HIV and Persistent Infections.  Vitals [employer] R. Howard CMA; 02/09/2016 10:34 AM) 02/09/2016 10:34 AM Weight: 190.25 lb Height: 66in Body Surface Area: 1.96 m Body Mass Index: 30.71 kg/m  BP: 126/74 (Sitting, Left Arm, Standard)       Physical Exam Darlene Ok, MD; 02/09/2016 10:50 AM) General Mental Status-Alert. General Appearance-Consistent with stated age. Hydration-Well hydrated. Voice-Normal.  Head and Neck Head-normocephalic, atraumatic with no lesions or palpable masses.  Eye Eyeball - Bilateral-Extraocular movements intact. Sclera/Conjunctiva - Bilateral-No scleral icterus.  Chest and Lung Exam Chest and lung exam reveals -quiet, even and easy respiratory effort with no use of accessory muscles. Inspection Chest Wall - Normal. Back - normal.  Cardiovascular Cardiovascular examination reveals -normal heart sounds, regular rate and rhythm with no murmurs.  Abdomen Inspection Normal Exam - No Hernias. Palpation/Percussion Normal exam - Soft, Non Tender, No Rebound tenderness, No Rigidity (guarding) and No hepatosplenomegaly. Auscultation Normal exam - Bowel sounds  normal.  Neurologic Neurologic evaluation reveals -alert and oriented x 3 with no impairment of recent or remote memory. Mental Status-Normal.  Musculoskeletal Normal Exam - Left-Upper Extremity Strength Normal and Lower Extremity Strength Normal. Normal Exam - Right-Upper Extremity Strength Normal, Lower Extremity Weakness.    Assessment & Plan Darlene Ok MD; 02/09/2016 10:49 AM) SYMPTOMATIC CHOLELITHIASIS (K80.20) Impression: 65 year old female with symptomatic cholelithiasis  1. The patient will like to proceed to the operative for laparoscopic cholecystectomy 2. Risks and benefits were discussed with the patient to generally include, but not limited to: infection, bleeding, possible need for post op ERCP, damage to the bile ducts, bile leak, and possible need for further surgery. Alternatives were offered and described. All questions were answered and the patient voiced understanding of the procedure and wishes to proceed at this point with a laparoscopic cholecystectomy Current Plans

## 2016-02-10 ENCOUNTER — Telehealth: Payer: Self-pay | Admitting: Internal Medicine

## 2016-02-10 ENCOUNTER — Telehealth: Payer: Self-pay | Admitting: *Deleted

## 2016-02-10 ENCOUNTER — Other Ambulatory Visit (HOSPITAL_BASED_OUTPATIENT_CLINIC_OR_DEPARTMENT_OTHER): Payer: Medicare Other

## 2016-02-10 ENCOUNTER — Encounter: Payer: Self-pay | Admitting: Internal Medicine

## 2016-02-10 ENCOUNTER — Ambulatory Visit (HOSPITAL_BASED_OUTPATIENT_CLINIC_OR_DEPARTMENT_OTHER): Payer: Medicare Other | Admitting: Internal Medicine

## 2016-02-10 VITALS — BP 115/59 | HR 77 | Temp 98.1°F | Resp 19 | Wt 192.1 lb

## 2016-02-10 DIAGNOSIS — Z9484 Stem cells transplant status: Secondary | ICD-10-CM | POA: Diagnosis not present

## 2016-02-10 DIAGNOSIS — C9000 Multiple myeloma not having achieved remission: Secondary | ICD-10-CM

## 2016-02-10 LAB — COMPREHENSIVE METABOLIC PANEL
ALBUMIN: 3 g/dL — AB (ref 3.5–5.0)
ALK PHOS: 66 U/L (ref 40–150)
ALT: 17 U/L (ref 0–55)
AST: 15 U/L (ref 5–34)
Anion Gap: 11 mEq/L (ref 3–11)
BUN: 9.9 mg/dL (ref 7.0–26.0)
CALCIUM: 9.3 mg/dL (ref 8.4–10.4)
CHLORIDE: 108 meq/L (ref 98–109)
CO2: 22 mEq/L (ref 22–29)
CREATININE: 1.1 mg/dL (ref 0.6–1.1)
EGFR: 63 mL/min/{1.73_m2} — ABNORMAL LOW (ref >90)
Glucose: 136 mg/dl (ref 70–140)
Potassium: 3.8 mEq/L (ref 3.5–5.1)
Sodium: 141 mEq/L (ref 136–145)
Total Bilirubin: 0.98 mg/dL (ref 0.20–1.20)
Total Protein: 7.5 g/dL (ref 6.4–8.3)

## 2016-02-10 LAB — CBC WITH DIFFERENTIAL/PLATELET
BASO%: 1.1 % (ref 0.0–2.0)
Basophils Absolute: 0.1 10*3/uL (ref 0.0–0.1)
EOS%: 0.9 % (ref 0.0–7.0)
Eosinophils Absolute: 0.1 10*3/uL (ref 0.0–0.5)
HEMATOCRIT: 29.1 % — AB (ref 34.8–46.6)
HEMOGLOBIN: 9.1 g/dL — AB (ref 11.6–15.9)
LYMPH#: 0.8 10*3/uL — AB (ref 0.9–3.3)
LYMPH%: 11.1 % — ABNORMAL LOW (ref 14.0–49.7)
MCH: 29.8 pg (ref 25.1–34.0)
MCHC: 31.3 g/dL — ABNORMAL LOW (ref 31.5–36.0)
MCV: 95.1 fL (ref 79.5–101.0)
MONO#: 1.1 10*3/uL — ABNORMAL HIGH (ref 0.1–0.9)
MONO%: 15.3 % — ABNORMAL HIGH (ref 0.0–14.0)
NEUT%: 71.6 % (ref 38.4–76.8)
NEUTROS ABS: 5 10*3/uL (ref 1.5–6.5)
Platelets: 139 10*3/uL — ABNORMAL LOW (ref 145–400)
RBC: 3.06 10*6/uL — ABNORMAL LOW (ref 3.70–5.45)
RDW: 19.3 % — AB (ref 11.2–14.5)
WBC: 7 10*3/uL (ref 3.9–10.3)

## 2016-02-10 LAB — LACTATE DEHYDROGENASE: LDH: 143 U/L (ref 125–245)

## 2016-02-10 NOTE — Telephone Encounter (Signed)
per pof to sch pt appt-sent MW email tosch zometa-will call pt after reply

## 2016-02-10 NOTE — Progress Notes (Signed)
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  2) the patient had evidence for disease recurrence in August of 2008 and she was treated with 6 cycles of Velcade completed on 03/30/2008 with response to the treatment but was discontinued secondary to neuropathy.  3) maintenance treatment with Revlimid 10 mg by mouth daily by the does was later increase to 15 mg by mouth daily secondary to biochemical recurrence with stabilization of her disease. The treatment with Revlimid 15 mg by mouth daily was discontinued secondary to evidence for disease progression.  CURRENT THERAPY:  1) Revlimid 25 mg by mouth daily for 3 weeks every 4 weeks in addition to Decadron currently at 20 mg by mouth on a weekly basis. She is status post 8 cycles. She started cycle #10 on 02/10/2016 2) Zometa 4 mg IV every 3 months. Next dose in 05/05/2015  ADVANCED DIRECTIVE: She does not have advanced directive and was given information.  INTERVAL HISTORY: Darlene Howard 65 y.o. female returns to the clinic today for routine monthly followup visit. She is feeling fine today with no specific complaints. She was found recently to have evidence for cholecystitis and gallbladder stones. She was seen by Dr. Rosendo Gros and expected to have cholecystectomy in the next few weeks. She is tolerating her treatment with Revlimid and Decadron fairly well with no significant adverse effects. She status post 9 cycles. The patient denied having any fever or chills. She denied having any chest pain, shortness of breath, cough or hemoptysis. She has no weight loss or  night sweats. She had repeat myeloma panel performed earlier today and she is here for evaluation and discussion of her lab results.   MEDICAL HISTORY: Past Medical History  Diagnosis Date  . Glaucoma     uses eye drops as instructed  . Personal history of colon cancer     ASCENDING COLON--  S/P  RIGHT HEMICOLECTOMY WITH NEGATIVE NODES/    NO RECURRENCE  . Herpes     takes Valtrex as needed  . GERD (gastroesophageal reflux disease)     takes Pantoprazole daily  . Hypertension     takes Amlodipine and Diovan daily  . Bruises easily     d/t being on COumadin  . History of colon polyps   . Type 2 diabetes mellitus ([place])   . 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 H/O stem cell transplant ([place])     04-26-2006  AT [facility]  . Multiple myeloma DX  2006  STATE IIA,  IgA  KAPPA    STABLE  PER DR MOHAMED--  S/P STEM CELL TRANSPLANT 2007 /  RECURRENCE 2008  CHEMO ENDED 03-30-2008/   NOW ON MAINTANANCE REVLIMID )  . Vocal cord nodule     ASSESSED BY DR TEOH--  LMA  OK W/ PROCEDURE'S  . DDD (degenerative disc disease), lumbar   . Cholelithiasis   . Fatty liver     ALLERGIES:  is allergic to codeine.  MEDICATIONS:  Current Outpatient Prescriptions  Medication Sig Dispense Refill  . amLODipine (NORVASC) 2.5 MG tablet  Take 1 tablet by mouth  daily in the morning 90 tablet 0  . aspirin (ASPIRIN LOW DOSE) 81 MG EC tablet Take 81 mg by mouth daily.      . calcium-vitamin D (OSCAL 500/200 D-3) 500-200 MG-UNIT per tablet Take 1 tablet by mouth 3 (three) times daily.      . cholecalciferol (VITAMIN D) 1000 UNITS tablet Take 1,000 Units by mouth daily.    Marland Kitchen dexamethasone (DECADRON) 4 MG tablet TAKE 5 TABLETS BY MOUTH ON  A WEEKLY BASIS AS DIRECTED 80 tablet 1  . dorzolamide-timolol (COSOPT) 22.3-6.8 MG/ML ophthalmic solution     . gabapentin (NEURONTIN) 100 MG capsule Take 1 capsule by mouth 3  times daily 270 capsule 0  . lenalidomide  (REVLIMID) 25 MG capsule TAKE 1 CAPSULE BY MOUTH EVERY DAY FOR 21 DAYS, 7 DAYS REST Adult female not of childbearing potential. Auth number [id number] 12/23/15 21 capsule 1  . loratadine (CLARITIN) 10 MG tablet TAKE ONE TABLET BY MOUTH ONCE DAILY. 30 tablet 3  . LUMIGAN 0.01 % SOLN     . metFORMIN (GLUCOPHAGE) 500 MG tablet Take 1 tablet (500 mg total) by mouth daily with breakfast. 90 tablet 3  . Multiple Vitamins-Minerals (CENTRUM SILVER PO) Take 1 tablet by mouth daily.     . ONE TOUCH ULTRA TEST test strip USE AS DIRECTED TO CHECK DAILY. 50 each 3  . pantoprazole (PROTONIX) 40 MG tablet TAKE 1 TABLET BY MOUTH  DAILY FOR ACID REFLUX 90 tablet 0  . potassium chloride SA (K-DUR,KLOR-CON) 20 MEQ tablet Take 1 tablet by mouth  twice a day 180 tablet 0  . PREMARIN vaginal cream PLACE 1 APPLICATORFUL VAGINALLY TWICE A WEEK. 30 g 0  . valACYclovir (VALTREX) 1000 MG tablet Take 1 tablet by mouth  daily as needed 90 tablet 1  . valsartan-hydrochlorothiazide (DIOVAN-HCT) 320-25 MG tablet Take 1 tablet by mouth  daily 90 tablet 0  . warfarin (COUMADIN) 2 MG tablet Take 1 tablet by mouth  daily 90 tablet 0   No current facility-administered medications for this visit.    SURGICAL HISTORY:  Past Surgical History  Procedure Laterality Date  . Colonoscopy  10/07/2006    DZH:GDJMEQ rectum/Status post right hemicolectomy. Normal residual colon  . Cervical cone biopsy    . Cataract extraction w/phaco Left 01/20/2014    Procedure: CATARACT EXTRACTION PHACO AND INTRAOCULAR LENS PLACEMENT (IOC);  Surgeon: Adonis Brook, MD;  Location: [facility];  Service: Ophthalmology;  Laterality: Left;  . 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 Co2  ablation vulva perianal and vaginal dysplatic areas  02-15-2010  . Abdominal hysterectomy  1983    w/  appendectomy and left salpingoophorectom  . Hemicolectomy Right 04-27-2005    CARCINOMA   ASCENDING COLON  . Vulvectomy N/A 05/06/2014    Procedure: WIDE LOCAL  EXCISION LEFT LABIA MAJORA;  Surgeon: Janie Morning, MD;  Location: [facility];  Service: Gynecology;  Laterality: N/A;  . Vulvar lesion removal N/A 05/06/2014    Procedure: LASER OF VULVAR LESION;  Surgeon: Janie Morning, MD;  Location: [facility];  Service: Gynecology;  Laterality: N/A;  . Lesion removal N/A 05/06/2014    Procedure: LASER OF THE VAGINA;  Surgeon: Janie Morning, MD;  Location: [facility];  Service: Gynecology;  Laterality: N/A;  . Esophagogastroduodenoscopy  04/02/2005    AST:MHDQQI/WLNLGXQJJHER polyp with central erosion versus ulceration in the body of the stomach, resected and recovered  Remainder of the gastric mucosa, D1, D2 appeared normal  . Colonoscopy  10/2009    RMR: diminutive RS polyp (hyperplastic).   . Colonoscopy N/A 12/15/2014    Procedure: COLONOSCOPY;  Surgeon: Daneil Dolin, MD;  [facility];  Service: Endoscopy;  Laterality: N/A;  130     REVIEW OF SYSTEMS:  A comprehensive review of systems was negative except for: Constitutional: positive for fatigue   PHYSICAL EXAMINATION: General appearance: alert, cooperative and no distress Head: Normocephalic, without obvious abnormality, atraumatic Neck: no adenopathy Lymph nodes: Cervical, supraclavicular, and axillary nodes normal. Resp: clear to auscultation bilaterally Back: symmetric, no curvature. ROM normal. No CVA tenderness. Cardio: regular rate and rhythm, S1, S2 normal, no murmur, click, rub or gallop GI: soft, non-tender; bowel sounds normal; no masses,  no organomegaly Extremities: extremities normal, atraumatic, no cyanosis or edema Neurologic: Alert and oriented X 3, normal strength and tone. Normal symmetric reflexes. Normal coordination and gait  ECOG PERFORMANCE STATUS: 1 - Symptomatic but completely ambulatory  Blood pressure 115/59, pulse 77, temperature 98.1 F (36.7 C), temperature source Oral, resp. rate 19, weight 192 lb 1.6  oz (87.136 kg), SpO2 100 %.  LABORATORY DATA: Lab Results  Component Value Date   WBC 7.0 02/10/2016   HGB 9.1* 02/10/2016   HCT 29.1* 02/10/2016   MCV 95.1 02/10/2016   PLT 139* 02/10/2016      Chemistry      Component Value Date/Time   NA 143 01/26/2016 1646   NA 142 01/12/2016 1130   K 3.5 01/26/2016 1646   K 3.5 01/12/2016 1130   CL 98 01/26/2016 1646   CL 105 06/17/2013 1441   CO2 30 01/26/2016 1646   CO2 25 01/12/2016 1130   BUN 7 01/26/2016 1646   BUN 13.2 01/12/2016 1130   CREATININE 0.76 01/26/2016 1646   CREATININE 0.9 01/12/2016 1130   CREATININE 0.91 12/23/2015 0949      Component Value Date/Time   CALCIUM 9.6 01/26/2016 1646   CALCIUM 8.9 01/12/2016 1130   ALKPHOS 52 01/26/2016 1646   ALKPHOS 55 01/12/2016 1130   AST 14 01/26/2016 1646   AST 14 01/12/2016 1130   ALT 14 01/26/2016 1646   ALT 17 01/12/2016 1130   BILITOT 1.2 01/26/2016 1646   BILITOT 1.29* 01/12/2016 1130       RADIOGRAPHIC STUDIES: No results found.  ASSESSMENT AND PLAN: This is a very pleasant 65 years old [place] American female with history of multiple myeloma currently on treatment with Revlimid and low-dose Decadron and tolerated her treatment fairly well. The patient is doing fine today with no specific complaints except for mild fatigue.  Because of disease progression, I recommended for her to continue her treatment with Revlimid and Decadron but I increased the dose of Revlimid to 25 mg by mouth daily for 21 days every 4 weeks, status post 9 months of this regimen. The myeloma panel is still pending. I will call the patient with results once it is available.  She will start cycle #10 today. I discussed the lab result with the patient and recommended for her to continue her treatment as scheduled. She will come back for follow-up visit in one month for reevaluation with repeat CBC, comprehensive metabolic panel and LDH. The patient will continue her treatment with Zometa every  3 months as a scheduled. I also recommended for the patient to hold her treatment with Revlimid and Coumadin 3-4 days before and after her planned cholecystectomy. She was  advised to call immediately if she has any concerning symptoms and interval. The patient voices understanding of current disease status and treatment options and is in agreement with the current care plan.  All questions were answered. The patient knows to call the clinic with any problems, questions or concerns. We can certainly see the patient much sooner if necessary.  Disclaimer: This note was dictated with voice recognition software. Similar sounding words can inadvertently be transcribed and may not be corrected upon review.

## 2016-02-10 NOTE — Telephone Encounter (Signed)
Per staff message and POF I have scheduled appts. Advised scheduler of appts. JMW  

## 2016-02-10 NOTE — Telephone Encounter (Signed)
cld pt and lef tmessage of zometa appt on 4/28

## 2016-02-11 LAB — BETA 2 MICROGLOBULIN, SERUM: Beta-2: 3.4 mg/L — ABNORMAL HIGH (ref 0.6–2.4)

## 2016-02-11 LAB — IGG, IGA, IGM
IGM (IMMUNOGLOBIN M), SRM: 10 mg/dL — AB (ref 26–217)
IgG, Qn, Serum: 256 mg/dL — ABNORMAL LOW (ref 700–1600)

## 2016-02-13 LAB — KAPPA/LAMBDA LIGHT CHAINS
Ig Kappa Free Light Chain: 15.25 mg/L (ref 3.30–19.40)
Ig Lambda Free Light Chain: 5.34 mg/L — ABNORMAL LOW (ref 5.71–26.30)
KAPPA/LAMBDA FLC RATIO: 2.86 — AB (ref 0.26–1.65)

## 2016-02-14 DIAGNOSIS — B351 Tinea unguium: Secondary | ICD-10-CM | POA: Diagnosis not present

## 2016-02-14 DIAGNOSIS — E1165 Type 2 diabetes mellitus with hyperglycemia: Secondary | ICD-10-CM | POA: Diagnosis not present

## 2016-02-21 ENCOUNTER — Other Ambulatory Visit: Payer: Self-pay | Admitting: Internal Medicine

## 2016-02-22 ENCOUNTER — Encounter (HOSPITAL_COMMUNITY): Payer: Self-pay

## 2016-02-22 ENCOUNTER — Other Ambulatory Visit: Payer: Self-pay | Admitting: Internal Medicine

## 2016-02-22 ENCOUNTER — Other Ambulatory Visit (HOSPITAL_COMMUNITY): Payer: Self-pay | Admitting: *Deleted

## 2016-02-22 ENCOUNTER — Encounter (HOSPITAL_COMMUNITY)
Admission: RE | Admit: 2016-02-22 | Discharge: 2016-02-22 | Disposition: A | Discharge status: Home / Self Care | Payer: Medicare Other | Source: Ambulatory Visit | Attending: General Surgery | Admitting: General Surgery

## 2016-02-22 DIAGNOSIS — Z9484 Stem cells transplant status: Secondary | ICD-10-CM | POA: Insufficient documentation

## 2016-02-22 DIAGNOSIS — E119 Type 2 diabetes mellitus without complications: Secondary | ICD-10-CM | POA: Insufficient documentation

## 2016-02-22 DIAGNOSIS — Z7982 Long term (current) use of aspirin: Secondary | ICD-10-CM | POA: Insufficient documentation

## 2016-02-22 DIAGNOSIS — Z79899 Other long term (current) drug therapy: Secondary | ICD-10-CM | POA: Insufficient documentation

## 2016-02-22 DIAGNOSIS — Z01812 Encounter for preprocedural laboratory examination: Secondary | ICD-10-CM | POA: Diagnosis not present

## 2016-02-22 DIAGNOSIS — K8020 Calculus of gallbladder without cholecystitis without obstruction: Secondary | ICD-10-CM | POA: Insufficient documentation

## 2016-02-22 DIAGNOSIS — Z01818 Encounter for other preprocedural examination: Secondary | ICD-10-CM | POA: Insufficient documentation

## 2016-02-22 DIAGNOSIS — Z85038 Personal history of other malignant neoplasm of large intestine: Secondary | ICD-10-CM | POA: Diagnosis not present

## 2016-02-22 DIAGNOSIS — C9000 Multiple myeloma not having achieved remission: Secondary | ICD-10-CM | POA: Diagnosis not present

## 2016-02-22 DIAGNOSIS — I10 Essential (primary) hypertension: Secondary | ICD-10-CM | POA: Diagnosis not present

## 2016-02-22 DIAGNOSIS — H409 Unspecified glaucoma: Secondary | ICD-10-CM | POA: Insufficient documentation

## 2016-02-22 DIAGNOSIS — Z7901 Long term (current) use of anticoagulants: Secondary | ICD-10-CM | POA: Insufficient documentation

## 2016-02-22 LAB — BASIC METABOLIC PANEL
ANION GAP: 14 (ref 5–15)
BUN: 7 mg/dL (ref 6–20)
CO2: 24 mmol/L (ref 22–32)
Calcium: 9.5 mg/dL (ref 8.9–10.3)
Chloride: 103 mmol/L (ref 101–111)
Creatinine, Ser: 0.96 mg/dL (ref 0.44–1.00)
GFR calc Af Amer: >60 mL/min (ref >60)
GLUCOSE: 108 mg/dL — AB (ref 65–99)
POTASSIUM: 3.4 mmol/L — AB (ref 3.5–5.1)
Sodium: 141 mmol/L (ref 135–145)

## 2016-02-22 LAB — CBC
HEMATOCRIT: 28.7 % — AB (ref 36.0–46.0)
HEMOGLOBIN: 8.8 g/dL — AB (ref 12.0–15.0)
MCH: 29.8 pg (ref 26.0–34.0)
MCHC: 30.7 g/dL (ref 30.0–36.0)
MCV: 97.3 fL (ref 78.0–100.0)
Platelets: 132 10*3/uL — ABNORMAL LOW (ref 150–400)
RBC: 2.95 MIL/uL — ABNORMAL LOW (ref 3.87–5.11)
RDW: 18 % — ABNORMAL HIGH (ref 11.5–15.5)
WBC: 10.1 10*3/uL (ref 4.0–10.5)

## 2016-02-22 LAB — GLUCOSE, CAPILLARY: GLUCOSE-CAPILLARY: 111 mg/dL — AB (ref 65–99)

## 2016-02-22 NOTE — Pre-Procedure Instructions (Signed)
Darlene Howard  02/22/2016      [facility] - [place], [place] [address] Phone: [phone] Fax: [phone]  [facility] [place] - [address] [place] [facility] [place] [address] Phone: [phone] Fax: [phone]  [place] [place] [facility] [address] [place] [address] [place] [address] Phone: [phone] Fax: [phone]  [facility] [place] [place] [address] Phone: [phone] Fax: [phone]    Your procedure is scheduled on 02-28-2016   Tuesday .  Report to [facility] Admitting at 5:30A.M.   Call this number if you have problems the morning of surgery:  [phone]   Remember:  Do not eat food or drink liquids after midnight.   Take these medicines the morning of surgery with A SIP OF WATER Amlodipine(Norvasc),Cosopt eye drops,gabapentin(Neurotin),loratadine(Claritin),Pantoprazole(Protonix),Potassium,             Follow MD instructions for coumadin               How to Manage Your Diabetes Before Surgery   Why is it important to control my blood sugar before and after surgery?   Improving blood sugar levels before and after surgery helps healing and can limit problems.  A way of improving blood sugar control is eating a healthy diet by:  - Eating less sugar and carbohydrates  - Increasing activity/exercise  - Talk with your doctor about reaching your blood sugar goals  High blood sugars (greater than 180 mg/dL) can raise your risk of infections and slow down your recovery so you will need to focus on controlling your diabetes during the weeks before surgery.  Make sure that the doctor who takes care of your diabetes knows about your planned surgery including the date and location.  How do I manage my blood sugars before  surgery?   Check your blood sugar at least 4 times a day, 2 days before surgery to make sure that they are not too high or low.   Check your blood sugar the morning of your surgery when you wake up and every 2  hours until you get to the Short-Stay unit.  If your blood sugar is less than 70 mg/dL, you will need to treat for low blood sugar by:  Treat a low blood sugar (less than 70 mg/dL) with 1/2 cup of clear juice (cranberry or apple), 4 glucose tablets, OR glucose gel.  Recheck blood sugar in 15 minutes after treatment (to make sure it is greater than 70 mg/dL).  If blood sugar is not greater than 70 mg/dL on re-check, call [phone] for further instructions.   Report your blood sugar to the Short-Stay nurse when you get to Short-Stay.  References:  [facility], 2007 "How to Manage your Diabetes Before and After Surgery".  What do I do about my diabetes medications?   Do not take oral diabetes medicines (pills) the morning of surgery.                     Do not wear jewelry, make-up or nail polish.  Do not wear lotions, powders, or perfumes.  You may not wear deodorant.  Do not shave 48 hours prior to surgery.   .  Do not bring valuables to the hospital.  [facility]  is not responsible for any belongings or valuables.  Contacts, dentures or bridgework may not be worn into surgery.  Leave your suitcase in the car.  After surgery it may be brought to your room.  For patients admitted to the hospital, discharge time will be determined by your treatment team.  Patients discharged the day of surgery will not be allowed to drive home.    Special instructions:  See attached sheet for instructions on CHG showers  Please read over the following fact sheets that you were given. Pain Booklet, Coughing and Deep Breathing and Surgical Site Infection Prevention

## 2016-02-23 ENCOUNTER — Encounter (HOSPITAL_COMMUNITY): Payer: Self-pay

## 2016-02-23 LAB — HEMOGLOBIN A1C
Hgb A1c MFr Bld: 6.3 % — ABNORMAL HIGH (ref 4.8–5.6)
MEAN PLASMA GLUCOSE: 134 mg/dL

## 2016-02-23 NOTE — Progress Notes (Signed)
Anesthesia Chart Review:  Pt is a 65 year old female scheduled for laparoscopic cholecystectomy on 02/28/2016 with Dr. Rosendo Gros.   Oncologist is Dr. Curt Bears, who is aware of upcoming surgery.   PMH includes:  HTN, DM, fatty liver, multiple myeloma, s/p autologous stem cell transplant, colon cancer, glaucoma. Never smoker. BMI 31.   Medications include: amlodipine, ASA, dexamethasone, cosopt ophthalmic, metformin, protonix, potassium, revlimid, valsartan-hctz, coumadin. Last dose coumadin 02/22/16, last dose revlimid 3-4 days prior to surgery.   Preoperative labs reviewed.  H/H 8.8/28.7. This is consistent with recent lab results as followed by oncologist Dr. [name]. Will get T&S DOS. PT & PTT also to be obtained DOS after pt stops coumadin.   EKG 03/09/15: NSR.   If labs acceptable DOS, I anticipate pt can proceed with surgery as scheduled.   Willeen Cass, FNP-BC [facility] Stay Surgical [facility]/Anesthesiology Phone: [phone] 02/23/2016 1:36 PM

## 2016-02-27 MED ORDER — CEFAZOLIN SODIUM-DEXTROSE 2-3 GM-% IV SOLR
2.0000 g | INTRAVENOUS | Status: AC
  Administered 2016-02-28: 2 g via INTRAVENOUS
  Filled 2016-02-27: qty 50

## 2016-02-27 MED ORDER — CHLORHEXIDINE GLUCONATE 4 % EX LIQD: 1.0000 "application " | Freq: Once | CUTANEOUS | Status: DC

## 2016-02-27 NOTE — Anesthesia Preprocedure Evaluation (Addendum)
Anesthesia Evaluation  Patient identified by MRN, date of birth, ID band Patient awake    Reviewed: Allergy & Precautions, H&P , NPO status , Patient's Chart, lab work & pertinent test results  Airway Mallampati: II  TM Distance: >3 FB Neck ROM: Full    Dental no notable dental hx. (+) Teeth Intact, Dental Advisory Given   Pulmonary neg pulmonary ROS,    Pulmonary exam normal breath sounds clear to auscultation       Cardiovascular hypertension, Pt. on medications  Rhythm:Regular Rate:Normal     Neuro/Psych negative neurological ROS  negative psych ROS   GI/Hepatic Neg liver ROS, GERD  Medicated and Controlled,  Endo/Other  diabetes, Type 2, Oral Hypoglycemic Agents  Renal/GU negative Renal ROS  negative genitourinary   Musculoskeletal  (+) Arthritis , Osteoarthritis,    Abdominal   Peds  Hematology negative hematology ROS (+)   Anesthesia Other Findings   Reproductive/Obstetrics negative OB ROS                            Anesthesia Physical Anesthesia Plan  ASA: III  Anesthesia Plan: General   Post-op Pain Management:    Induction: Intravenous  Airway Management Planned: Oral ETT  Additional Equipment:   Intra-op Plan:   Post-operative Plan: Extubation in OR  Informed Consent: I have reviewed the patients History and Physical, chart, labs and discussed the procedure including the risks, benefits and alternatives for the proposed anesthesia with the patient or authorized representative who has indicated his/her understanding and acceptance.   Dental advisory given  Plan Discussed with: CRNA  Anesthesia Plan Comments:         Anesthesia Quick Evaluation

## 2016-02-28 ENCOUNTER — Encounter (HOSPITAL_COMMUNITY)
Admission: RE | Disposition: A | Discharge status: Home / Self Care | Payer: Self-pay | Source: Ambulatory Visit | Attending: General Surgery

## 2016-02-28 ENCOUNTER — Ambulatory Visit (HOSPITAL_COMMUNITY): Payer: Medicare Other | Admitting: Emergency Medicine

## 2016-02-28 ENCOUNTER — Ambulatory Visit (HOSPITAL_COMMUNITY)
Admission: RE | Admit: 2016-02-28 | Discharge: 2016-02-28 | Disposition: A | Discharge status: Home / Self Care | Payer: Medicare Other | Source: Ambulatory Visit | Attending: General Surgery | Admitting: General Surgery

## 2016-02-28 ENCOUNTER — Encounter (HOSPITAL_COMMUNITY): Payer: Self-pay | Admitting: *Deleted

## 2016-02-28 ENCOUNTER — Ambulatory Visit (HOSPITAL_COMMUNITY): Payer: Medicare Other | Admitting: Vascular Surgery

## 2016-02-28 DIAGNOSIS — Z7901 Long term (current) use of anticoagulants: Secondary | ICD-10-CM | POA: Insufficient documentation

## 2016-02-28 DIAGNOSIS — M1990 Unspecified osteoarthritis, unspecified site: Secondary | ICD-10-CM | POA: Insufficient documentation

## 2016-02-28 DIAGNOSIS — Z7984 Long term (current) use of oral hypoglycemic drugs: Secondary | ICD-10-CM | POA: Insufficient documentation

## 2016-02-28 DIAGNOSIS — E119 Type 2 diabetes mellitus without complications: Secondary | ICD-10-CM | POA: Diagnosis not present

## 2016-02-28 DIAGNOSIS — I10 Essential (primary) hypertension: Secondary | ICD-10-CM | POA: Diagnosis not present

## 2016-02-28 DIAGNOSIS — Z79890 Hormone replacement therapy (postmenopausal): Secondary | ICD-10-CM | POA: Insufficient documentation

## 2016-02-28 DIAGNOSIS — K8010 Calculus of gallbladder with chronic cholecystitis without obstruction: Secondary | ICD-10-CM | POA: Insufficient documentation

## 2016-02-28 DIAGNOSIS — C9000 Multiple myeloma not having achieved remission: Secondary | ICD-10-CM | POA: Insufficient documentation

## 2016-02-28 DIAGNOSIS — K219 Gastro-esophageal reflux disease without esophagitis: Secondary | ICD-10-CM | POA: Insufficient documentation

## 2016-02-28 DIAGNOSIS — Z79899 Other long term (current) drug therapy: Secondary | ICD-10-CM | POA: Diagnosis not present

## 2016-02-28 DIAGNOSIS — Z7982 Long term (current) use of aspirin: Secondary | ICD-10-CM | POA: Diagnosis not present

## 2016-02-28 DIAGNOSIS — K8020 Calculus of gallbladder without cholecystitis without obstruction: Secondary | ICD-10-CM | POA: Diagnosis present

## 2016-02-28 HISTORY — PX: CHOLECYSTECTOMY: SHX55

## 2016-02-28 LAB — GLUCOSE, CAPILLARY
GLUCOSE-CAPILLARY: 124 mg/dL — AB (ref 65–99)
GLUCOSE-CAPILLARY: 151 mg/dL — AB (ref 65–99)

## 2016-02-28 LAB — PROTIME-INR
INR: 1.15 (ref 0.00–1.49)
Prothrombin Time: 14.9 seconds (ref 11.6–15.2)

## 2016-02-28 LAB — APTT: aPTT: 27 seconds (ref 24–37)

## 2016-02-28 LAB — ABO/RH: ABO/RH(D): A POS

## 2016-02-28 SURGERY — LAPAROSCOPIC CHOLECYSTECTOMY
Anesthesia: General | Site: Abdomen

## 2016-02-28 MED ORDER — ROCURONIUM BROMIDE 100 MG/10ML IV SOLN
INTRAVENOUS | Status: DC | PRN
  Administered 2016-02-28: 40 mg via INTRAVENOUS

## 2016-02-28 MED ORDER — PROPOFOL 10 MG/ML IV BOLUS
INTRAVENOUS | Status: AC
  Filled 2016-02-28: qty 20

## 2016-02-28 MED ORDER — FENTANYL CITRATE (PF) 100 MCG/2ML IJ SOLN
INTRAMUSCULAR | Status: DC | PRN
  Administered 2016-02-28: 50 ug via INTRAVENOUS
  Administered 2016-02-28: 100 ug via INTRAVENOUS

## 2016-02-28 MED ORDER — SODIUM CHLORIDE 0.9% FLUSH: 3.0000 mL | Freq: Two times a day (BID) | INTRAVENOUS | Status: DC

## 2016-02-28 MED ORDER — ONDANSETRON 4 MG PO TBDP: 4.0000 mg | ORAL_TABLET | Freq: Three times a day (TID) | ORAL | Status: DC | PRN

## 2016-02-28 MED ORDER — LACTATED RINGERS IV SOLN
INTRAVENOUS | Status: DC | PRN
  Administered 2016-02-28: 07:00:00 via INTRAVENOUS

## 2016-02-28 MED ORDER — HYDROMORPHONE HCL 1 MG/ML IJ SOLN
INTRAMUSCULAR | Status: AC
  Administered 2016-02-28: 0.5 mg via INTRAVENOUS
  Filled 2016-02-28: qty 1

## 2016-02-28 MED ORDER — SUGAMMADEX SODIUM 200 MG/2ML IV SOLN
INTRAVENOUS | Status: AC
  Filled 2016-02-28: qty 2

## 2016-02-28 MED ORDER — PROPOFOL 10 MG/ML IV BOLUS
INTRAVENOUS | Status: DC | PRN
  Administered 2016-02-28: 120 mg via INTRAVENOUS

## 2016-02-28 MED ORDER — OXYCODONE HCL 5 MG PO TABS: 5.0000 mg | ORAL_TABLET | ORAL | Status: DC | PRN

## 2016-02-28 MED ORDER — 0.9 % SODIUM CHLORIDE (POUR BTL) OPTIME
TOPICAL | Status: DC | PRN
  Administered 2016-02-28: 1000 mL

## 2016-02-28 MED ORDER — SODIUM CHLORIDE 0.9 % IV SOLN: 250.0000 mL | INTRAVENOUS | Status: DC | PRN

## 2016-02-28 MED ORDER — ACETAMINOPHEN 325 MG PO TABS
ORAL_TABLET | ORAL | Status: AC
  Filled 2016-02-28: qty 2

## 2016-02-28 MED ORDER — BUPIVACAINE HCL (PF) 0.25 % IJ SOLN
INTRAMUSCULAR | Status: AC
  Filled 2016-02-28: qty 30

## 2016-02-28 MED ORDER — MIDAZOLAM HCL 2 MG/2ML IJ SOLN
INTRAMUSCULAR | Status: AC
  Filled 2016-02-28: qty 2

## 2016-02-28 MED ORDER — SUGAMMADEX SODIUM 200 MG/2ML IV SOLN
INTRAVENOUS | Status: DC | PRN
  Administered 2016-02-28: 200 mg via INTRAVENOUS

## 2016-02-28 MED ORDER — FENTANYL CITRATE (PF) 250 MCG/5ML IJ SOLN
INTRAMUSCULAR | Status: AC
  Filled 2016-02-28: qty 5

## 2016-02-28 MED ORDER — ONDANSETRON HCL 4 MG/2ML IJ SOLN
INTRAMUSCULAR | Status: DC | PRN
  Administered 2016-02-28: 4 mg via INTRAVENOUS

## 2016-02-28 MED ORDER — MIDAZOLAM HCL 5 MG/5ML IJ SOLN
INTRAMUSCULAR | Status: DC | PRN
  Administered 2016-02-28: 2 mg via INTRAVENOUS

## 2016-02-28 MED ORDER — LIDOCAINE HCL (CARDIAC) 20 MG/ML IV SOLN
INTRAVENOUS | Status: DC | PRN
  Administered 2016-02-28: 80 mg via INTRAVENOUS

## 2016-02-28 MED ORDER — MORPHINE SULFATE (PF) 2 MG/ML IV SOLN
2.0000 mg | INTRAVENOUS | Status: DC | PRN
Start: 2016-02-28 — End: 2016-02-28

## 2016-02-28 MED ORDER — SODIUM CHLORIDE 0.9% FLUSH: 3.0000 mL | INTRAVENOUS | Status: DC | PRN

## 2016-02-28 MED ORDER — SODIUM CHLORIDE 0.9 % IR SOLN
Status: DC | PRN
  Administered 2016-02-28: 1000 mL

## 2016-02-28 MED ORDER — BUPIVACAINE HCL 0.25 % IJ SOLN
INTRAMUSCULAR | Status: DC | PRN
  Administered 2016-02-28: 4 mL

## 2016-02-28 MED ORDER — ACETAMINOPHEN 650 MG RE SUPP: 650.0000 mg | RECTAL | Status: DC | PRN

## 2016-02-28 MED ORDER — OXYCODONE HCL 5 MG PO TABS
ORAL_TABLET | ORAL | Status: AC
  Filled 2016-02-28: qty 2

## 2016-02-28 MED ORDER — OXYCODONE HCL 5 MG PO TABS
5.0000 mg | ORAL_TABLET | ORAL | Status: DC | PRN
  Administered 2016-02-28: 10 mg via ORAL

## 2016-02-28 MED ORDER — HYDROMORPHONE HCL 1 MG/ML IJ SOLN
0.2500 mg | INTRAMUSCULAR | Status: DC | PRN
  Administered 2016-02-28 (×2): 0.5 mg via INTRAVENOUS

## 2016-02-28 MED ORDER — ACETAMINOPHEN 325 MG PO TABS
650.0000 mg | ORAL_TABLET | ORAL | Status: DC | PRN
  Administered 2016-02-28: 650 mg via ORAL

## 2016-02-28 SURGICAL SUPPLY — 39 items
APL SKNCLS STERI-STRIP NONHPOA (GAUZE/BANDAGES/DRESSINGS) ×1
BAG SPEC RTRVL 10 TROC 200 (ENDOMECHANICALS) ×1
BENZOIN TINCTURE PRP APPL 2/3 (GAUZE/BANDAGES/DRESSINGS) ×3 IMPLANT
CANISTER SUCTION 2500CC (MISCELLANEOUS) ×3 IMPLANT
CHLORAPREP W/TINT 26ML (MISCELLANEOUS) ×3 IMPLANT
CLIP LIGATING HEMO O LOK GREEN (MISCELLANEOUS) ×3 IMPLANT
CLOSURE STERI-STRIP 1/2X4 (GAUZE/BANDAGES/DRESSINGS) ×1
CLSR STERI-STRIP ANTIMIC 1/2X4 (GAUZE/BANDAGES/DRESSINGS) ×2 IMPLANT
COVER SURGICAL LIGHT HANDLE (MISCELLANEOUS) ×3 IMPLANT
COVER TRANSDUCER ULTRASND (DRAPES) ×1 IMPLANT
DEVICE TROCAR PUNCTURE CLOSURE (ENDOMECHANICALS) ×3 IMPLANT
ELECT REM PT RETURN 9FT ADLT (ELECTROSURGICAL) ×3
ELECTRODE REM PT RTRN 9FT ADLT (ELECTROSURGICAL) ×1 IMPLANT
GAUZE SPONGE 2X2 8PLY STRL LF (GAUZE/BANDAGES/DRESSINGS) ×1 IMPLANT
GLOVE BIO SURGEON STRL SZ7.5 (GLOVE) ×3 IMPLANT
GOWN STRL REUS W/ TWL LRG LVL3 (GOWN DISPOSABLE) ×2 IMPLANT
GOWN STRL REUS W/ TWL XL LVL3 (GOWN DISPOSABLE) ×1 IMPLANT
GOWN STRL REUS W/TWL LRG LVL3 (GOWN DISPOSABLE) ×6
GOWN STRL REUS W/TWL XL LVL3 (GOWN DISPOSABLE) ×3
KIT BASIN OR (CUSTOM PROCEDURE TRAY) ×3 IMPLANT
KIT ROOM TURNOVER OR (KITS) ×3 IMPLANT
NDL INSUFFLATION 14GA 120MM (NEEDLE) ×1 IMPLANT
NEEDLE INSUFFLATION 14GA 120MM (NEEDLE) ×3 IMPLANT
NS IRRIG 1000ML POUR BTL (IV SOLUTION) ×3 IMPLANT
PAD ARMBOARD 7.5X6 YLW CONV (MISCELLANEOUS) ×6 IMPLANT
POUCH RETRIEVAL ECOSAC 10 (ENDOMECHANICALS) IMPLANT
POUCH RETRIEVAL ECOSAC 10MM (ENDOMECHANICALS) ×2
SCISSORS LAP 5X35 DISP (ENDOMECHANICALS) ×3 IMPLANT
SET IRRIG TUBING LAPAROSCOPIC (IRRIGATION / IRRIGATOR) ×3 IMPLANT
SLEEVE ENDOPATH XCEL 5M (ENDOMECHANICALS) ×3 IMPLANT
SPECIMEN JAR SMALL (MISCELLANEOUS) ×3 IMPLANT
SPONGE GAUZE 2X2 STER 10/PKG (GAUZE/BANDAGES/DRESSINGS) ×2
SUT MNCRL AB 3-0 PS2 18 (SUTURE) ×3 IMPLANT
TOWEL OR 17X24 6PK STRL BLUE (TOWEL DISPOSABLE) ×3 IMPLANT
TOWEL OR 17X26 10 PK STRL BLUE (TOWEL DISPOSABLE) ×3 IMPLANT
TRAY LAPAROSCOPIC MC (CUSTOM PROCEDURE TRAY) ×3 IMPLANT
TROCAR XCEL NON-BLD 11X100MML (ENDOMECHANICALS) ×3 IMPLANT
TROCAR XCEL NON-BLD 5MMX100MML (ENDOMECHANICALS) ×3 IMPLANT
TUBING INSUFFLATION (TUBING) ×3 IMPLANT

## 2016-02-28 NOTE — Transfer of Care (Signed)
Immediate Anesthesia Transfer of Care Note  Patient: Darlene Howard  Procedure(s) Performed: Procedure(s): LAPAROSCOPIC CHOLECYSTECTOMY (N/A)  Patient Location: PACU  Anesthesia Type:General  Level of Consciousness: awake, alert  and oriented  Airway & Oxygen Therapy: Patient Spontanous Breathing and Patient connected to nasal cannula oxygen  Post-op Assessment: Report given to RN, Post -op Vital signs reviewed and stable and Patient moving all extremities X 4  Post vital signs: Reviewed and stable  Last Vitals:  Filed Vitals:   02/28/16 0711  BP: 126/58  Pulse: 79  Temp: 36.8 C  Resp: 16    Complications: No apparent anesthesia complications

## 2016-02-28 NOTE — Progress Notes (Signed)
Pt resting quietly; sleeping '@times'$ ; minimal pain; VSS. Pt unable to void @ this time. Spoke with Judeen Hammans in short stay for bed request. Charge RN will call back when room is available.

## 2016-02-28 NOTE — H&P (View-Only) (Signed)
History of Present Illness Ralene Ok MD; 02/09/2016 10:49 AM) Patient words: GB.  The patient is a 65 year old female who presents for evaluation of gall stones. The patient is a 65 year old female who is referred by Dr. [name] for an evaluation of symptomatic cholelithiasis. Patient states that over the last 3-4 months she is experienced epigastric/right upper quadrant pain that radiates to the back. She states that she noticed fried/high fatty foods cause the pain. Patient underwent ultrasound in the past, March 2015, which revealed multiple gallstones. Patient states she currently takes medication for her multiple myeloma. During the cycle she has had experiences of elevated LFTs.   Problem List/Past Medical [name], [place]; 02/09/2016 10:35 AM) MULTIPLE MYELOMA (C90.00)  Other Problems [name], CMA; 02/09/2016 10:35 AM) Arthritis Colon Cancer Diabetes Mellitus Gastroesophageal Reflux Disease High blood pressure  Past Surgical History [name], [place]; 02/09/2016 10:34 AM) Colon Removal - Partial Hysterectomy (not due to cancer) - Partial  Diagnostic Studies History [name], [place]; 02/09/2016 10:34 AM) Colonoscopy 1-5 years ago Mammogram within last year Pap Smear 1-5 years ago  Allergies [name], CMA; 02/09/2016 10:35 AM) Codeine Sulfate *ANALGESICS - OPIOID*  Medication History [name], CMA; 02/09/2016 10:40 AM) AmLODIPine Besylate (2.5MG Tablet, Oral) Active. Lumigan (0.01% Solution, Ophthalmic) Active. Dorzolamide HCl-Timolol Mal (22.3-6.8MG/ML Solution, Ophthalmic) Active. Gabapentin (100MG Capsule, Oral) Active. Premarin (0.625MG/GM Cream, Vaginal) Active. Revlimid (25MG Capsule, Oral) Active. MetFORMIN HCl (1000MG Tablet, Oral) Active. Pantoprazole Sodium (40MG Tablet DR, Oral) Active. Potassium Chloride Crys ER (20MEQ Tablet ER, Oral) Active. ValACYclovir HCl (1GM Tablet, Oral)  Active. Valsartan-Hydrochlorothiazide (320-25MG Tablet, Oral) Active. Warfarin Sodium (2MG Tablet, Oral) Active. Aspirin (81MG Tablet DR, Oral) Active. Calcium Carbonate-Vitamin D (500-200MG-UNIT Tablet, Oral) Active. Vitamin D (1000UNIT Tablet, Oral) Active. Dexamethasone (4MG Tablet, Oral) Active. Loratadine (10MG Tablet, Oral) Active. Multi Vitamin (Oral) Active. Imodium (2MG Capsule, Oral) Active. Tylenol (325MG Tablet, Oral) Active. Medications Reconciled  Social History [name], [place]; 02/09/2016 10:34 AM) Caffeine use Carbonated beverages, Tea. No alcohol use No drug use Tobacco use Never smoker.  Family History [name], [place]; 02/09/2016 10:34 AM) Arthritis Father, Mother. Hypertension Father, Mother. Kidney Disease Mother.  Pregnancy / Birth History [name], [place]; 02/09/2016 10:34 AM) Age at menarche 51 years. Age of menopause <43 Gravida 0 Para 0    Review of Systems [name] CMA; 02/09/2016 10:34 AM) General Not Present- Appetite Loss, Chills, Fatigue, Fever, Night Sweats, Weight Gain and Weight Loss. Skin Not Present- Change in Wart/Mole, Dryness, Hives, Jaundice, New Lesions, Non-Healing Wounds, Rash and Ulcer. HEENT Not Present- Earache, Hearing Loss, Hoarseness, Nose Bleed, Oral Ulcers, Ringing in the Ears, Seasonal Allergies, Sinus Pain, Sore Throat, Visual Disturbances, Wears glasses/contact lenses and Yellow Eyes. Respiratory Not Present- Bloody sputum, Chronic Cough, Difficulty Breathing, Snoring and Wheezing. Breast Not Present- Breast Mass, Breast Pain, Nipple Discharge and Skin Changes. Cardiovascular Not Present- Chest Pain, Difficulty Breathing Lying Down, Leg Cramps, Palpitations, Rapid Heart Rate, Shortness of Breath and Swelling of Extremities. Gastrointestinal Present- Abdominal Pain, Excessive gas and Indigestion. Not Present- Bloating, Bloody Stool, Change in Bowel Habits, Chronic diarrhea,  Constipation, Difficulty Swallowing, Gets full quickly at meals, Hemorrhoids, Nausea, Rectal Pain and Vomiting. Female Genitourinary Not Present- Frequency, Nocturia, Painful Urination, Pelvic Pain and Urgency. Musculoskeletal Not Present- Back Pain, Joint Pain, Joint Stiffness, Muscle Pain, Muscle Weakness and Swelling of Extremities. Neurological Not Present- Decreased Memory, Fainting, Headaches, Numbness, Seizures, Tingling, Tremor, Trouble walking and  Weakness. Psychiatric Not Present- Anxiety, Bipolar, Change in Sleep Pattern, Depression, Fearful and Frequent crying. Endocrine Not Present- Cold Intolerance, Excessive Hunger, Hair Changes, Heat Intolerance, Hot flashes and New Diabetes. Hematology Present- Easy Bruising. Not Present- Excessive bleeding, Gland problems, HIV and Persistent Infections.  Vitals [employer] R. Brooks CMA; 02/09/2016 10:34 AM) 02/09/2016 10:34 AM Weight: 190.25 lb Height: 66in Body Surface Area: 1.96 m Body Mass Index: 30.71 kg/m  BP: 126/74 (Sitting, Left Arm, Standard)       Physical Exam Ralene Ok, MD; 02/09/2016 10:50 AM) General Mental Status-Alert. General Appearance-Consistent with stated age. Hydration-Well hydrated. Voice-Normal.  Head and Neck Head-normocephalic, atraumatic with no lesions or palpable masses.  Eye Eyeball - Bilateral-Extraocular movements intact. Sclera/Conjunctiva - Bilateral-No scleral icterus.  Chest and Lung Exam Chest and lung exam reveals -quiet, even and easy respiratory effort with no use of accessory muscles. Inspection Chest Wall - Normal. Back - normal.  Cardiovascular Cardiovascular examination reveals -normal heart sounds, regular rate and rhythm with no murmurs.  Abdomen Inspection Normal Exam - No Hernias. Palpation/Percussion Normal exam - Soft, Non Tender, No Rebound tenderness, No Rigidity (guarding) and No hepatosplenomegaly. Auscultation Normal exam - Bowel sounds  normal.  Neurologic Neurologic evaluation reveals -alert and oriented x 3 with no impairment of recent or remote memory. Mental Status-Normal.  Musculoskeletal Normal Exam - Left-Upper Extremity Strength Normal and Lower Extremity Strength Normal. Normal Exam - Right-Upper Extremity Strength Normal, Lower Extremity Weakness.    Assessment & Plan Ralene Ok MD; 02/09/2016 10:49 AM) SYMPTOMATIC CHOLELITHIASIS (K80.20) Impression: 65 year old female with symptomatic cholelithiasis  1. The patient will like to proceed to the operative for laparoscopic cholecystectomy 2. Risks and benefits were discussed with the patient to generally include, but not limited to: infection, bleeding, possible need for post op ERCP, damage to the bile ducts, bile leak, and possible need for further surgery. Alternatives were offered and described. All questions were answered and the patient voiced understanding of the procedure and wishes to proceed at this point with a laparoscopic cholecystectomy Current Plans

## 2016-02-28 NOTE — Discharge Instructions (Signed)
[facility] ______[facility] °LAPAROSCOPIC SURGERY: POST OP INSTRUCTIONS °Always review your discharge instruction sheet given to you by the facility where your surgery was performed. °IF YOU HAVE DISABILITY OR FAMILY LEAVE FORMS, YOU MUST BRING THEM TO THE OFFICE FOR PROCESSING.   °DO NOT GIVE THEM TO YOUR DOCTOR. ° °1. A prescription for pain medication may be given to you upon discharge.  Take your pain medication as prescribed, if needed.  If narcotic pain medicine is not needed, then you may take acetaminophen (Tylenol) or ibuprofen (Advil) as needed. °2. Take your usually prescribed medications unless otherwise directed. °3. If you need a refill on your pain medication, please contact your pharmacy.  They will contact our office to request authorization. Prescriptions will not be filled after 5pm or on week-ends. °4. You should follow a light diet the first few days after arrival home, such as soup and crackers, etc.  Be sure to include lots of fluids daily. °5. Most patients will experience some swelling and bruising in the area of the incisions.  Ice packs will help.  Swelling and bruising can take several days to resolve.  °6. It is common to experience some constipation if taking pain medication after surgery.  Increasing fluid intake and taking a stool softener (such as Colace) will usually help or prevent this problem from occurring.  A mild laxative (Milk of Magnesia or Miralax) should be taken according to package instructions if there are no bowel movements after 48 hours. °7. Unless discharge instructions indicate otherwise, you may remove your bandages 24-48 hours after surgery, and you may shower at that time.  You may have steri-strips (small skin tapes) in place directly over the incision.  These strips should be left on the skin for 7-10 days.  If your surgeon used skin glue on the incision, you may shower in 24 hours.  The glue will flake off over the next 2-3 weeks.  Any sutures or  staples will be removed at the office during your follow-up visit. °8. ACTIVITIES:  You may resume regular (light) daily activities beginning the next day--such as daily self-care, walking, climbing stairs--gradually increasing activities as tolerated.  You may have sexual intercourse when it is comfortable.  Refrain from any heavy lifting or straining until approved by your doctor. °a. You may drive when you are no longer taking prescription pain medication, you can comfortably wear a seatbelt, and you can safely maneuver your car and apply brakes. °b. RETURN TO WORK:  __________________________________________________________ °9. You should see your doctor in the office for a follow-up appointment approximately 2-3 weeks after your surgery.  Make sure that you call for this appointment within a day or two after you arrive home to insure a convenient appointment time. °10. OTHER INSTRUCTIONS: __________________________________________________________________________________________________________________________ __________________________________________________________________________________________________________________________ °WHEN TO CALL YOUR DOCTOR: °1. Fever over 101.0 °2. Inability to urinate °3. Continued bleeding from incision. °4. Increased pain, redness, or drainage from the incision. °5. Increasing abdominal pain ° °The clinic staff is available to answer your questions during regular business hours.  Please don’t hesitate to call and ask to speak to one of the nurses for clinical concerns.  If you have a medical emergency, go to the nearest emergency room or call 911.  A surgeon from [facility] is always on call at the hospital. °[address], [address], [place], [place]  [address] ? [address] [address], [place], [place]   [address] °[phone] ? [phone] ? FAX [phone] °Web site:   [email or web address] °

## 2016-02-28 NOTE — Op Note (Signed)
02/28/2016  8:23 AM  PATIENT:  Darlene Howard  65 y.o. female  PRE-OPERATIVE DIAGNOSIS:  Gallstones  POST-OPERATIVE DIAGNOSIS:  Chronic cholecystitis, Gallstones  PROCEDURE:  Procedure(s): LAPAROSCOPIC CHOLECYSTECTOMY (N/A)  SURGEON:  Surgeon(s) and Role:    * Ralene Ok, MD - Primary  ANESTHESIA:   local and general  EBL:   <5cc  BLOOD ADMINISTERED:none  DRAINS: none   LOCAL MEDICATIONS USED:  BUPIVICAINE   SPECIMEN:  Source of Specimen:  gallbladder  DISPOSITION OF SPECIMEN:  PATHOLOGY  COUNTS:  YES  TOURNIQUET:  * No tourniquets in log *  DICTATION: .Dragon Dictation  EBL: <3ZH   Complications: none   Counts: reported as correct x 2   Findings:chronically inflamed gallbladder  Indications for procedure: Pt is a 65 y/o F with RUQ pain and seen to have gallstones.   Details of the procedure: The patient was taken to the operating and placed in the supine position with bilateral SCDs in place. A time out was called and all facts were verified. A pneumoperitoneum was obtained via A Veress needle technique to a pressure of 74m of mercury. A 573mtrochar was then placed in the right upper quadrant under visualization, and there were no injuries to any abdominal organs. A 11 mm port was then placed in the umbilical region after infiltrating with local anesthesia under direct visualization. A second epigastric port was placed under direct visualization.   There was a moderated amount of omental adhesions seen to the lateral abominal wall.  These were taken down sharply.  There was also omental adhesions over the gallbladder to the liver.  These were taken down with cautery.  The gallbladder was identified and retracted, the peritoneum was then sharply dissected from the gallbladder and this dissection was carried down to Calot's triangle. The cystic duct was identified and stripped away circumferentially and seen going into the gallbladder 360, the critical angle was  obtained.   2 clips were placed proximally one distally and the cystic duct transected. The cystic artery was identified and 2 clips placed proximally and one distally and transected. We then proceeded to remove the gallbladder off the hepatic fossa with Bovie cautery. A retrieval bag was then placed in the abdomen and gallbladder placed in the bag. The hepatic fossa was then reexamined and hemostasis was achieved with Bovie cautery and was excellent at this portion of the case. The subhepatic fossa and perihepatic fossa was then irrigated until the effluent was clear. The specimen bag and specimen were removed from the abdominal cavity.  The 11 mm trocar fascia was reapproximated with the Endo Close #1 Vicryl x2. The pneumoperitoneum was evacuated and all trochars removed under direct visulalization. The skin was then closed with 4-0 Monocryl and the skin dressed with Steri-Strips, gauze, and tape. The patient was awaken from general anesthesia and taken to the recovery room in stable condition.    PLAN OF CARE: Discharge to home after PACU  PATIENT DISPOSITION:  PACU - hemodynamically stable.   Delay start of Pharmacological VTE agent (>24hrs) due to surgical blood loss or risk of bleeding: not applicable

## 2016-02-28 NOTE — Interval H&P Note (Signed)
History and Physical Interval Note:  02/28/2016 6:49 AM  Darlene Howard  has presented today for surgery, with the diagnosis of Gallstones  The various methods of treatment have been discussed with the patient and family. After consideration of risks, benefits and other options for treatment, the patient has consented to  Procedure(s): LAPAROSCOPIC CHOLECYSTECTOMY (N/A) as a surgical intervention .  The patient's history has been reviewed, patient examined, no change in status, stable for surgery.  I have reviewed the patient's chart and labs.  Questions were answered to the patient's satisfaction.     Rosario Jacks., Anne Hahn

## 2016-02-28 NOTE — Anesthesia Postprocedure Evaluation (Signed)
Anesthesia Post Note  Patient: Darlene Howard  Procedure(s) Performed: Procedure(s) (LRB): LAPAROSCOPIC CHOLECYSTECTOMY (N/A)  Patient location during evaluation: PACU Anesthesia Type: General Level of consciousness: awake and alert Pain management: pain level controlled Vital Signs Assessment: post-procedure vital signs reviewed and stable Respiratory status: spontaneous breathing, nonlabored ventilation and respiratory function stable Cardiovascular status: blood pressure returned to baseline and stable Postop Assessment: no signs of nausea or vomiting Anesthetic complications: no    Last Vitals:  Filed Vitals:   02/28/16 1015 02/28/16 1030  BP: 124/64 115/63  Pulse: 63 64  Temp:    Resp: 17 18    Last Pain:  Filed Vitals:   02/28/16 1041  PainSc: 2                  Rastus Borton,W. EDMOND

## 2016-02-28 NOTE — Progress Notes (Signed)
Report given to jamie rn as caregiver 

## 2016-02-28 NOTE — Anesthesia Procedure Notes (Signed)
Procedure Name: Intubation Date/Time: 02/28/2016 7:38 AM Performed by: Mariea Clonts Pre-anesthesia Checklist: Emergency Drugs available, Timeout performed, Suction available, Patient identified and Patient being monitored Patient Re-evaluated:Patient Re-evaluated prior to inductionOxygen Delivery Method: Circle system utilized Preoxygenation: Pre-oxygenation with 100% oxygen Intubation Type: IV induction Ventilation: Mask ventilation without difficulty and Oral airway inserted - appropriate to patient size Laryngoscope Size: Sabra Heck and 2 Grade View: Grade II Tube type: Oral Tube size: 7.0 mm Number of attempts: 1 Placement Confirmation: ETT inserted through vocal cords under direct vision,  breath sounds checked- equal and bilateral and positive ETCO2 Tube secured with: Tape Dental Injury: Teeth and Oropharynx as per pre-operative assessment

## 2016-02-28 NOTE — Progress Notes (Signed)
Dana, from blood bank called, needed to know if type and screen needed to be irradiated? And how many units? Spoke to Dr. Alonza Smoker, stated yes the blood needed to be irradiated and have 2 units available. I informed dana in blood bank .

## 2016-02-29 ENCOUNTER — Encounter (HOSPITAL_COMMUNITY): Payer: Self-pay | Admitting: General Surgery

## 2016-02-29 LAB — TYPE AND SCREEN
ABO/RH(D): A POS
Antibody Screen: NEGATIVE
UNIT DIVISION: 0
Unit division: 0

## 2016-03-02 ENCOUNTER — Other Ambulatory Visit: Payer: Self-pay | Admitting: Gynecologic Oncology

## 2016-03-02 ENCOUNTER — Telehealth: Payer: Self-pay

## 2016-03-02 ENCOUNTER — Other Ambulatory Visit: Payer: Self-pay | Admitting: Family Medicine

## 2016-03-02 NOTE — Telephone Encounter (Signed)
Patient called stating that she was asked to stop revlimid and coumadin on 02/24/16 for surgery on 02/28/16.  Patient would like a return call from Dr. Worthy Flank nurse to instruct her on when to restart both medications.

## 2016-03-05 NOTE — Telephone Encounter (Signed)
Pt to restart Revlimid and coumadin. Pt advised she has restarted this medicine as of yesterday. No further concerns.

## 2016-03-09 ENCOUNTER — Other Ambulatory Visit: Payer: Self-pay

## 2016-03-09 DIAGNOSIS — C9000 Multiple myeloma not having achieved remission: Secondary | ICD-10-CM

## 2016-03-09 MED ORDER — REVLIMID 25 MG PO CAPS: 25.0000 mg | ORAL_CAPSULE | Freq: Every day | ORAL | Status: DC

## 2016-03-09 NOTE — Telephone Encounter (Signed)
Patient called requesting her revlimid and states that it is on hold at her speciality pharmacy.  Medication was sent to incorrect pharmacy without a authorization number.  Writer sent it to the correct pharmacy with an [place] #.  Pharmacy will ship it out today to the patient, patient is aware and states understanding.

## 2016-03-12 ENCOUNTER — Telehealth: Payer: Self-pay | Admitting: Internal Medicine

## 2016-03-12 ENCOUNTER — Encounter: Payer: Self-pay | Admitting: Internal Medicine

## 2016-03-12 ENCOUNTER — Ambulatory Visit (HOSPITAL_BASED_OUTPATIENT_CLINIC_OR_DEPARTMENT_OTHER): Payer: Medicare Other | Admitting: Internal Medicine

## 2016-03-12 ENCOUNTER — Other Ambulatory Visit (HOSPITAL_BASED_OUTPATIENT_CLINIC_OR_DEPARTMENT_OTHER): Payer: Medicare Other

## 2016-03-12 VITALS — BP 145/67 | HR 99 | Temp 98.1°F | Resp 18 | Ht 66.0 in | Wt 192.7 lb

## 2016-03-12 DIAGNOSIS — D649 Anemia, unspecified: Secondary | ICD-10-CM | POA: Diagnosis not present

## 2016-03-12 DIAGNOSIS — C9000 Multiple myeloma not having achieved remission: Secondary | ICD-10-CM

## 2016-03-12 LAB — CBC WITH DIFFERENTIAL/PLATELET
BASO%: 0.4 % (ref 0.0–2.0)
Basophils Absolute: 0 10*3/uL (ref 0.0–0.1)
EOS%: 1.4 % (ref 0.0–7.0)
Eosinophils Absolute: 0.2 10*3/uL (ref 0.0–0.5)
HCT: 27.3 % — ABNORMAL LOW (ref 34.8–46.6)
HGB: 8.4 g/dL — ABNORMAL LOW (ref 11.6–15.9)
LYMPH#: 1 10*3/uL (ref 0.9–3.3)
LYMPH%: 9.2 % — ABNORMAL LOW (ref 14.0–49.7)
MCH: 30 pg (ref 25.1–34.0)
MCHC: 30.8 g/dL — AB (ref 31.5–36.0)
MCV: 97.5 fL (ref 79.5–101.0)
MONO#: 1.5 10*3/uL — ABNORMAL HIGH (ref 0.1–0.9)
MONO%: 13.3 % (ref 0.0–14.0)
NEUT%: 75.7 % (ref 38.4–76.8)
NEUTROS ABS: 8.4 10*3/uL — AB (ref 1.5–6.5)
Platelets: 155 10*3/uL (ref 145–400)
RBC: 2.8 10*6/uL — AB (ref 3.70–5.45)
RDW: 17.7 % — ABNORMAL HIGH (ref 11.2–14.5)
WBC: 11.1 10*3/uL — AB (ref 3.9–10.3)

## 2016-03-12 LAB — COMPREHENSIVE METABOLIC PANEL
ALT: 22 U/L (ref 0–55)
AST: 16 U/L (ref 5–34)
Albumin: 3 g/dL — ABNORMAL LOW (ref 3.5–5.0)
Alkaline Phosphatase: 66 U/L (ref 40–150)
Anion Gap: 9 mEq/L (ref 3–11)
BUN: 13.6 mg/dL (ref 7.0–26.0)
CALCIUM: 9.1 mg/dL (ref 8.4–10.4)
CHLORIDE: 104 meq/L (ref 98–109)
CO2: 29 meq/L (ref 22–29)
CREATININE: 1.1 mg/dL (ref 0.6–1.1)
EGFR: 62 mL/min/{1.73_m2} — ABNORMAL LOW (ref >90)
Glucose: 103 mg/dl (ref 70–140)
Potassium: 3.8 mEq/L (ref 3.5–5.1)
Sodium: 141 mEq/L (ref 136–145)
TOTAL PROTEIN: 7.6 g/dL (ref 6.4–8.3)
Total Bilirubin: 1.22 mg/dL — ABNORMAL HIGH (ref 0.20–1.20)

## 2016-03-12 LAB — LACTATE DEHYDROGENASE: LDH: 162 U/L (ref 125–245)

## 2016-03-12 NOTE — Telephone Encounter (Signed)
Gave and printed appt sched and avs for pt for April °

## 2016-03-12 NOTE — Progress Notes (Signed)
[facility] [phone]   [phone]  OFFICE PROGRESS NOTE  [name], MD [address], [address] [place] [address]  DIAGNOSIS: Stage IIA IgA kappa multiple myeloma diagnosed in 2006   PRIOR THERAPY:  1) status post treatment with thalidomide and Decadron followed by autologous peripheral blood stem cell transplant with high-dose melphalan on 04/26/2006 at [facility].  2) the patient had evidence for disease recurrence in August of 2008 and she was treated with 6 cycles of Velcade completed on 03/30/2008 with response to the treatment but was discontinued secondary to neuropathy.  3) maintenance treatment with Revlimid 10 mg by mouth daily by the does was later increase to 15 mg by mouth daily secondary to biochemical recurrence with stabilization of her disease. The treatment with Revlimid 15 mg by mouth daily was discontinued secondary to evidence for disease progression.  CURRENT THERAPY:  1) Revlimid 25 mg by mouth daily for 3 weeks every 4 weeks in addition to Decadron currently at 20 mg by mouth on a weekly basis. She is status post 10 cycles. She started cycle #11 on 03/17/2016 2) Zometa 4 mg IV every 3 months. Next dose in 05/05/2015  ADVANCED DIRECTIVE: She does not have advanced directive and was given information.  INTERVAL HISTORY: Darlene Howard 65 y.o. female returns to the clinic today for routine monthly followup visit accompanied by her sister. She is feeling fine today with no specific complaints except for mild fatigue. She recently underwent cholecystectomy for cholecystitis and gallbladder stones. Her Revlimid and Coumadin were on hold during the 2 weeks of surgery. She resumed her treatment 2 days ago. She is tolerating her treatment with Revlimid and Decadron fairly well with no significant adverse effects. She status post 10 cycles. The patient denied having any fever or chills. She denied having any chest  pain, shortness of breath, cough or hemoptysis. She has no weight loss or night sweats. She had repeat CBC, comprehensive metabolic panel and LDH performed earlier today and she is here for evaluation and discussion of her lab results.  MEDICAL HISTORY: Past Medical History  Diagnosis Date  . Glaucoma     uses eye drops as instructed  . Personal history of colon cancer     ASCENDING COLON--  S/P  RIGHT HEMICOLECTOMY WITH NEGATIVE NODES/    NO RECURRENCE  . Herpes     takes Valtrex as needed  . GERD (gastroesophageal reflux disease)     takes Pantoprazole daily  . Hypertension     takes Amlodipine and Diovan daily  . Bruises easily     d/t being on COumadin  . History of colon polyps   . Type 2 diabetes mellitus ([place])   . 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 H/O stem cell transplant ([place])     04-26-2006  AT [facility]  . Multiple myeloma DX  2006  STATE IIA,  IgA  KAPPA    STABLE  PER DR Donnarae Rae--  S/P STEM CELL TRANSPLANT 2007 /  RECURRENCE 2008  CHEMO ENDED 03-30-2008/   NOW ON MAINTANANCE REVLIMID )  . Vocal cord nodule     resolved- see Dr. Deeann Saint notes in media tab 06/17/14  . DDD (degenerative disc disease), lumbar   . Cholelithiasis   . Fatty liver     ALLERGIES:  is allergic to codeine and tramadol.  MEDICATIONS:  Current Outpatient  Prescriptions  Medication Sig Dispense Refill  . acetaminophen (TYLENOL) 500 MG tablet Take 500 mg by mouth daily as needed for moderate pain.    Marland Kitchen amLODipine (NORVASC) 2.5 MG tablet Take 1 tablet by mouth  daily in the morning 90 tablet 0  . aspirin (ASPIRIN LOW DOSE) 81 MG EC tablet Take 81 mg by mouth daily.      . calcium-vitamin D (OSCAL 500/200 D-3) 500-200 MG-UNIT per tablet Take 1 tablet by mouth 3 (three) times daily.      . cholecalciferol (VITAMIN D) 1000 UNITS tablet Take 1,000 Units by mouth daily.    Marland Kitchen dexamethasone (DECADRON) 4 MG tablet TAKE 5 TABLETS BY MOUTH ON  A  WEEKLY BASIS AS DIRECTED (Patient taking differently: TAKE 5 TABLETS BY MOUTH weekly on Thursday) 80 tablet 1  . dorzolamide-timolol (COSOPT) 22.3-6.8 MG/ML ophthalmic solution Place 1 drop into the left eye 2 (two) times daily.     Marland Kitchen gabapentin (NEURONTIN) 100 MG capsule Take 1 capsule by mouth 3  times daily 270 capsule 0  . loperamide (IMODIUM A-D) 2 MG tablet Take 2 mg by mouth daily as needed for diarrhea or loose stools.    Marland Kitchen loratadine (CLARITIN) 10 MG tablet TAKE ONE TABLET BY MOUTH ONCE DAILY. 30 tablet 3  . LUMIGAN 0.01 % SOLN Place 1 drop into both eyes at bedtime.     . metFORMIN (GLUCOPHAGE) 500 MG tablet Take 1 tablet (500 mg total) by mouth daily with breakfast. 90 tablet 3  . Multiple Vitamins-Minerals (CENTRUM SILVER PO) Take 1 tablet by mouth daily.     . ondansetron (ZOFRAN ODT) 4 MG disintegrating tablet Take 1 tablet (4 mg total) by mouth every 8 (eight) hours as needed for nausea or vomiting. 20 tablet 0  . ONE TOUCH ULTRA TEST test strip USE AS DIRECTED TO CHECK DAILY. 50 each 3  . oxyCODONE (ROXICODONE) 5 MG immediate release tablet Take 1 tablet (5 mg total) by mouth every 4 (four) hours as needed for severe pain. 30 tablet 0  . pantoprazole (PROTONIX) 40 MG tablet TAKE 1 TABLET BY MOUTH  DAILY FOR ACID REFLUX 90 tablet 0  . potassium chloride SA (K-DUR,KLOR-CON) 20 MEQ tablet Take 1 tablet by mouth  twice a day 180 tablet 0  . PREMARIN vaginal cream PLACE 1 APPLICATORFUL VAGINALLY TWICE A WEEK. 30 g 0  . REVLIMID 25 MG capsule Take 1 capsule (25 mg total) by mouth daily. Take one tablet for 21 days. Authorization # [id number] 21 capsule 0  . valACYclovir (VALTREX) 1000 MG tablet Take 1 tablet by mouth  daily as needed (Patient taking differently: Take 1 tablet by mouth  daily as needed for outbreaks) 90 tablet 1  . valsartan-hydrochlorothiazide (DIOVAN-HCT) 320-25 MG tablet Take 1 tablet by mouth  daily 90 tablet 0  . warfarin (COUMADIN) 2 MG tablet Take 1 tablet by mouth   daily 90 tablet 0   No current facility-administered medications for this visit.    SURGICAL HISTORY:  Past Surgical History  Procedure Laterality Date  . Colonoscopy  10/07/2006    [id number] rectum/Status post right hemicolectomy. Normal residual colon  . Cervical cone biopsy    . Cataract extraction w/phaco Left 01/20/2014    Procedure: CATARACT EXTRACTION PHACO AND INTRAOCULAR LENS PLACEMENT (IOC);  Surgeon: Adonis Brook, MD;  Location: [facility];  Service: Ophthalmology;  Laterality: Left;  . 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 Co2  ablation vulva perianal  and vaginal dysplatic areas  02-15-2010  . Abdominal hysterectomy  1983    w/  appendectomy and left salpingoophorectom  . Hemicolectomy Right 04-27-2005    CARCINOMA   ASCENDING COLON  . Vulvectomy N/A 05/06/2014    Procedure: WIDE LOCAL EXCISION LEFT LABIA MAJORA;  Surgeon: Janie Morning, MD;  Location: [facility];  Service: Gynecology;  Laterality: N/A;  . Vulvar lesion removal N/A 05/06/2014    Procedure: LASER OF VULVAR LESION;  Surgeon: Janie Morning, MD;  [facility];  Service: Gynecology;  Laterality: N/A;  . Lesion removal N/A 05/06/2014    Procedure: LASER OF THE VAGINA;  Surgeon: Janie Morning, MD;  Location: [facility];  Service: Gynecology;  Laterality: N/A;  . Esophagogastroduodenoscopy  04/02/2005    [id number] polyp with central erosion versus ulceration in the body of the stomach, resected and recovered Remainder of the gastric mucosa, D1, D2 appeared normal  . Colonoscopy  10/2009    RMR: diminutive RS polyp (hyperplastic).   . Colonoscopy N/A 12/15/2014    Procedure: COLONOSCOPY;  Surgeon: Daneil Dolin, MD;  [facility];  Service: Endoscopy;  Laterality: N/A;  130   . Cholecystectomy N/A 02/28/2016    Procedure: LAPAROSCOPIC CHOLECYSTECTOMY;  Surgeon: Ralene Ok, MD;  Location: [facility];  Service: General;   Laterality: N/A;    REVIEW OF SYSTEMS:  A comprehensive review of systems was negative except for: Constitutional: positive for fatigue   PHYSICAL EXAMINATION: General appearance: alert, cooperative and no distress Head: Normocephalic, without obvious abnormality, atraumatic Neck: no adenopathy Lymph nodes: Cervical, supraclavicular, and axillary nodes normal. Resp: clear to auscultation bilaterally Back: symmetric, no curvature. ROM normal. No CVA tenderness. Cardio: regular rate and rhythm, S1, S2 normal, no murmur, click, rub or gallop GI: soft, non-tender; bowel sounds normal; no masses,  no organomegaly Extremities: extremities normal, atraumatic, no cyanosis or edema Neurologic: Alert and oriented X 3, normal strength and tone. Normal symmetric reflexes. Normal coordination and gait  ECOG PERFORMANCE STATUS: 1 - Symptomatic but completely ambulatory  Blood pressure 145/67, pulse 99, temperature 98.1 F (36.7 C), temperature source Oral, resp. rate 18, height _0  (1.676 m), weight 192 lb 11.2 oz (87.408 kg), SpO2 99 %.  LABORATORY DATA: Lab Results  Component Value Date   WBC 11.1* 03/12/2016   HGB 8.4* 03/12/2016   HCT 27.3* 03/12/2016   MCV 97.5 03/12/2016   PLT 155 03/12/2016      Chemistry      Component Value Date/Time   NA 141 02/22/2016 1500   NA 141 02/10/2016 0918   K 3.4* 02/22/2016 1500   K 3.8 02/10/2016 0918   CL 103 02/22/2016 1500   CL 105 06/17/2013 1441   CO2 24 02/22/2016 1500   CO2 22 02/10/2016 0918   BUN 7 02/22/2016 1500   BUN 9.9 02/10/2016 0918   CREATININE 0.96 02/22/2016 1500   CREATININE 1.1 02/10/2016 0918   CREATININE 0.76 01/26/2016 1646      Component Value Date/Time   CALCIUM 9.5 02/22/2016 1500   CALCIUM 9.3 02/10/2016 0918   ALKPHOS 66 02/10/2016 0918   ALKPHOS 52 01/26/2016 1646   AST 15 02/10/2016 0918   AST 14 01/26/2016 1646   ALT 17 02/10/2016 0918   ALT 14 01/26/2016 1646   BILITOT 0.98 02/10/2016 0918   BILITOT  1.2 01/26/2016 1646       RADIOGRAPHIC STUDIES: No results found.  ASSESSMENT AND PLAN: This is a  very pleasant 65 years old [place] American female with history of multiple myeloma currently on treatment with Revlimid and low-dose Decadron and tolerated her treatment fairly well. The patient is doing fine today with no specific complaints except for mild fatigue.  Because of disease progression, I recommended for her to continue her treatment with Revlimid and Decadron but I increased the dose of Revlimid to 25 mg by mouth daily for 21 days every 4 weeks, status post 10 months of this regimen. CBC today showed persistent anemia but no other significant abnormalities except for mild leukocytosis probably reactive in nature. She will start cycle #11 later this week. I discussed the lab result with the patient and recommended for her to continue her treatment as scheduled. She will come back for follow-up visit in one month for reevaluation with repeat CBC, comprehensive metabolic panel and LDH. The patient will continue her treatment with Zometa every 3 months as a scheduled. She was advised to call immediately if she has any concerning symptoms and interval. The patient voices understanding of current disease status and treatment options and is in agreement with the current care plan.  All questions were answered. The patient knows to call the clinic with any problems, questions or concerns. We can certainly see the patient much sooner if necessary.  Disclaimer: This note was dictated with voice recognition software. Similar sounding words can inadvertently be transcribed and may not be corrected upon review.

## 2016-03-21 ENCOUNTER — Other Ambulatory Visit: Payer: Self-pay | Admitting: Family Medicine

## 2016-03-29 ENCOUNTER — Other Ambulatory Visit: Payer: Self-pay | Admitting: Internal Medicine

## 2016-04-06 ENCOUNTER — Telehealth: Payer: Self-pay | Admitting: *Deleted

## 2016-04-06 ENCOUNTER — Telehealth: Payer: Self-pay

## 2016-04-06 MED ORDER — LENALIDOMIDE 25 MG PO CAPS: 25.0000 mg | ORAL_CAPSULE | Freq: Every day | ORAL | Status: DC

## 2016-04-06 NOTE — Telephone Encounter (Signed)
Pt called with request to refill Revlimid, authorization number [id number] obtained from [place] website. [facility] spoke with CSR Shawna gave Auth# and pt's rx will be filled today and sent out to address on file. No further concerns.

## 2016-04-06 NOTE — Telephone Encounter (Signed)
Pt called stating [facility] called her on Tuesday. They have not received the revilimid refill request. I noted the [facility]ill was [facility]. It is unclear if we need [facility] [place] [facility] [place].

## 2016-04-09 ENCOUNTER — Ambulatory Visit (HOSPITAL_BASED_OUTPATIENT_CLINIC_OR_DEPARTMENT_OTHER): Payer: Medicare Other | Admitting: Internal Medicine

## 2016-04-09 ENCOUNTER — Encounter: Payer: Self-pay | Admitting: Internal Medicine

## 2016-04-09 ENCOUNTER — Telehealth: Payer: Self-pay | Admitting: Internal Medicine

## 2016-04-09 ENCOUNTER — Other Ambulatory Visit (HOSPITAL_BASED_OUTPATIENT_CLINIC_OR_DEPARTMENT_OTHER): Payer: Medicare Other

## 2016-04-09 VITALS — BP 112/53 | HR 92 | Temp 98.3°F | Resp 18 | Ht 66.0 in | Wt 191.9 lb

## 2016-04-09 DIAGNOSIS — C9000 Multiple myeloma not having achieved remission: Secondary | ICD-10-CM

## 2016-04-09 LAB — CBC WITH DIFFERENTIAL/PLATELET
BASO%: 1.2 % (ref 0.0–2.0)
BASOS ABS: 0.1 10*3/uL (ref 0.0–0.1)
EOS ABS: 0.2 10*3/uL (ref 0.0–0.5)
EOS%: 4.1 % (ref 0.0–7.0)
HCT: 27.3 % — ABNORMAL LOW (ref 34.8–46.6)
HEMOGLOBIN: 8.5 g/dL — AB (ref 11.6–15.9)
LYMPH%: 9.4 % — AB (ref 14.0–49.7)
MCH: 29.9 pg (ref 25.1–34.0)
MCHC: 31.1 g/dL — ABNORMAL LOW (ref 31.5–36.0)
MCV: 96.1 fL (ref 79.5–101.0)
MONO#: 0.8 10*3/uL (ref 0.1–0.9)
MONO%: 16.4 % — AB (ref 0.0–14.0)
NEUT%: 68.9 % (ref 38.4–76.8)
NEUTROS ABS: 3.4 10*3/uL (ref 1.5–6.5)
NRBC: 10 % — AB (ref 0–0)
Platelets: 84 10*3/uL — ABNORMAL LOW (ref 145–400)
RBC: 2.84 10*6/uL — AB (ref 3.70–5.45)
RDW: 18.2 % — AB (ref 11.2–14.5)
WBC: 4.9 10*3/uL (ref 3.9–10.3)
lymph#: 0.5 10*3/uL — ABNORMAL LOW (ref 0.9–3.3)

## 2016-04-09 LAB — COMPREHENSIVE METABOLIC PANEL
ALBUMIN: 2.9 g/dL — AB (ref 3.5–5.0)
ALK PHOS: 56 U/L (ref 40–150)
ALT: 28 U/L (ref 0–55)
AST: 27 U/L (ref 5–34)
Anion Gap: 8 mEq/L (ref 3–11)
BILIRUBIN TOTAL: 1.73 mg/dL — AB (ref 0.20–1.20)
BUN: 13.7 mg/dL (ref 7.0–26.0)
CO2: 26 meq/L (ref 22–29)
CREATININE: 1 mg/dL (ref 0.6–1.1)
Calcium: 9.1 mg/dL (ref 8.4–10.4)
Chloride: 107 mEq/L (ref 98–109)
EGFR: 64 mL/min/{1.73_m2} — AB (ref >90)
GLUCOSE: 113 mg/dL (ref 70–140)
Potassium: 3.5 mEq/L (ref 3.5–5.1)
SODIUM: 141 meq/L (ref 136–145)
TOTAL PROTEIN: 7.4 g/dL (ref 6.4–8.3)

## 2016-04-09 LAB — LACTATE DEHYDROGENASE: LDH: 163 U/L (ref 125–245)

## 2016-04-09 LAB — TECHNOLOGIST REVIEW: Technologist Review: 14

## 2016-04-09 NOTE — Progress Notes (Signed)
[facility] Telephone:([phone]) [phone]   Fax:(336) [phone]  OFFICE PROGRESS NOTE  Darlene Nakayama, MD [address] [place] [address]  DIAGNOSIS: Stage IIA IgA kappa multiple myeloma diagnosed in 2006   PRIOR THERAPY:  1) status post treatment with thalidomide and Decadron followed by autologous peripheral blood stem cell transplant with high-dose melphalan on 04/26/2006 at [facility].  2) the patient had evidence for disease recurrence in August of 2008 and she was treated with 6 cycles of Velcade completed on 03/30/2008 with response to the treatment but was discontinued secondary to neuropathy.  3) maintenance treatment with Revlimid 10 mg by mouth daily by the does was later increase to 15 mg by mouth daily secondary to biochemical recurrence with stabilization of her disease. The treatment with Revlimid 15 mg by mouth daily was discontinued secondary to evidence for disease progression.  CURRENT THERAPY:  1) Revlimid 25 mg by mouth daily for 3 weeks every 4 weeks in addition to Decadron currently at 20 mg by mouth on a weekly basis. She is status post 11 cycles. She started cycle #12 on 04/21/2016 2) Zometa 4 mg IV every 3 months. Next dose in 05/05/2015  ADVANCED DIRECTIVE: She does not have advanced directive and was given information.  INTERVAL HISTORY: Darlene Howard 65 y.o. female returns to the clinic today for routine monthly followup visit. She is feeling fine today with no specific complaints except for fatigue. She is tolerating her treatment with Revlimid and Decadron fairly well with no significant adverse effects. She status post 11 cycles. The patient denied having any fever or chills. She denied having any chest pain, shortness of breath, cough or hemoptysis. She has no weight loss or night sweats. She had repeat CBC, comprehensive metabolic panel and LDH performed earlier today and she is here for evaluation and discussion of her  lab results.  MEDICAL HISTORY: Past Medical History  Diagnosis Date  . Glaucoma     uses eye drops as instructed  . Personal history of colon cancer     ASCENDING COLON--  S/P  RIGHT HEMICOLECTOMY WITH NEGATIVE NODES/    NO RECURRENCE  . Herpes     takes Valtrex as needed  . GERD (gastroesophageal reflux disease)     takes Pantoprazole daily  . Hypertension     takes Amlodipine and Diovan daily  . Bruises easily     d/t being on COumadin  . History of colon polyps   . Type 2 diabetes mellitus ([place])   . 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 H/O stem cell transplant ([place])     04-26-2006  AT [facility]  . Multiple myeloma DX  2006  STATE IIA,  IgA  KAPPA    STABLE  PER DR Varsha Knock--  S/P STEM CELL TRANSPLANT 2007 /  RECURRENCE 2008  CHEMO ENDED 03-30-2008/   NOW ON MAINTANANCE REVLIMID )  . Vocal cord nodule     resolved- see Dr. Deeann Saint notes in media tab 06/17/14  . DDD (degenerative disc disease), lumbar   . Cholelithiasis   . Fatty liver     ALLERGIES:  is allergic to codeine and tramadol.  MEDICATIONS:  Current Outpatient Prescriptions  Medication Sig Dispense Refill  . acetaminophen (TYLENOL) 500 MG tablet Take 500 mg by mouth daily as needed for moderate pain.    Marland Kitchen aspirin (ASPIRIN LOW DOSE) 81 MG  EC tablet Take 81 mg by mouth daily.      . calcium carbonate (TUMS EX) 750 MG chewable tablet Chew by mouth.    . calcium-vitamin D (OSCAL 500/200 D-3) 500-200 MG-UNIT per tablet Take 1 tablet by mouth 3 (three) times daily.      . cholecalciferol (VITAMIN D) 1000 UNITS tablet Take 1,000 Units by mouth daily.    Marland Kitchen dexamethasone (DECADRON) 4 MG tablet TAKE 5 TABLETS BY MOUTH ON  A WEEKLY BASIS AS DIRECTED (Patient taking differently: TAKE 5 TABLETS BY MOUTH weekly on Thursday) 80 tablet 1  . dorzolamide-timolol (COSOPT) 22.3-6.8 MG/ML ophthalmic solution Place 1 drop into the left eye 2 (two) times daily.     Marland Kitchen gabapentin  (NEURONTIN) 100 MG capsule Take 1 capsule by mouth 3  times daily 270 capsule 0  . lenalidomide (REVLIMID) 25 MG capsule Take 1 capsule (25 mg total) by mouth daily. 21 capsule 1  . loperamide (IMODIUM A-D) 2 MG tablet Take 2 mg by mouth daily as needed for diarrhea or loose stools.    Marland Kitchen loratadine (CLARITIN) 10 MG tablet TAKE ONE TABLET BY MOUTH ONCE DAILY. 30 tablet 3  . LUMIGAN 0.01 % SOLN Place 1 drop into both eyes at bedtime.     . metFORMIN (GLUCOPHAGE) 500 MG tablet Take 1 tablet (500 mg total) by mouth daily with breakfast. 90 tablet 3  . Multiple Vitamins-Minerals (CENTRUM SILVER PO) Take 1 tablet by mouth daily.     . ONE TOUCH ULTRA TEST test strip USE AS DIRECTED TO CHECK DAILY. 50 each 3  . pantoprazole (PROTONIX) 40 MG tablet TAKE 1 TABLET BY MOUTH  DAILY FOR ACID REFLUX 90 tablet 0  . potassium chloride SA (K-DUR,KLOR-CON) 20 MEQ tablet Take 1 tablet by mouth  twice a day 180 tablet 0  . PREMARIN vaginal cream PLACE 1 APPLICATORFUL VAGINALLY TWICE A WEEK. 30 g 0  . valACYclovir (VALTREX) 1000 MG tablet Take 1 tablet by mouth  daily as needed (Patient taking differently: Take 1 tablet by mouth  daily as needed for outbreaks) 90 tablet 1  . valsartan-hydrochlorothiazide (DIOVAN-HCT) 320-25 MG tablet Take 1 tablet by mouth  daily 90 tablet 0  . warfarin (COUMADIN) 2 MG tablet Take 1 tablet by mouth  daily 90 tablet 0  . amLODipine (NORVASC) 2.5 MG tablet Take 1 tablet by mouth  daily in the morning 90 tablet 0  . ondansetron (ZOFRAN ODT) 4 MG disintegrating tablet Take 1 tablet (4 mg total) by mouth every 8 (eight) hours as needed for nausea or vomiting. (Patient not taking: Reported on 04/09/2016) 20 tablet 0  . oxyCODONE (ROXICODONE) 5 MG immediate release tablet Take 1 tablet (5 mg total) by mouth every 4 (four) hours as needed for severe pain. (Patient not taking: Reported on 04/09/2016) 30 tablet 0   No current facility-administered medications for this visit.    SURGICAL  HISTORY:  Past Surgical History  Procedure Laterality Date  . Colonoscopy  10/07/2006    [id number] rectum/Status post right hemicolectomy. Normal residual colon  . Cervical cone biopsy    . Cataract extraction w/phaco Left 01/20/2014    Procedure: CATARACT EXTRACTION PHACO AND INTRAOCULAR LENS PLACEMENT (IOC);  Surgeon: Adonis Brook, MD;  Location: [facility];  Service: Ophthalmology;  Laterality: Left;  . 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 Co2  ablation vulva perianal and vaginal dysplatic areas  02-15-2010  . Abdominal hysterectomy  1983  w/  appendectomy and left salpingoophorectom  . Hemicolectomy Right 04-27-2005    CARCINOMA   ASCENDING COLON  . Vulvectomy N/A 05/06/2014    Procedure: WIDE LOCAL EXCISION LEFT LABIA MAJORA;  Surgeon: Janie Morning, MD;  Location: [facility];  Service: Gynecology;  Laterality: N/A;  . Vulvar lesion removal N/A 05/06/2014    Procedure: LASER OF VULVAR LESION;  Surgeon: Janie Morning, MD;  Location: [facility];  Service: Gynecology;  Laterality: N/A;  . Lesion removal N/A 05/06/2014    Procedure: LASER OF THE VAGINA;  Surgeon: Janie Morning, MD;  Location: [facility];  Service: Gynecology;  Laterality: N/A;  . Esophagogastroduodenoscopy  04/02/2005    [id number] polyp with central erosion versus ulceration in the body of the stomach, resected and recovered Remainder of the gastric mucosa, D1, D2 appeared normal  . Colonoscopy  10/2009    RMR: diminutive RS polyp (hyperplastic).   . Colonoscopy N/A 12/15/2014    Procedure: COLONOSCOPY;  Surgeon: Daneil Dolin, MD;  [facility];  Service: Endoscopy;  Laterality: N/A;  130   . Cholecystectomy N/A 02/28/2016    Procedure: LAPAROSCOPIC CHOLECYSTECTOMY;  Surgeon: Ralene Ok, MD;  Location: [facility];  Service: General;  Laterality: N/A;    REVIEW OF SYSTEMS:  A comprehensive review of systems was negative except for:  Constitutional: positive for fatigue   PHYSICAL EXAMINATION: General appearance: alert, cooperative and no distress Head: Normocephalic, without obvious abnormality, atraumatic Neck: no adenopathy Lymph nodes: Cervical, supraclavicular, and axillary nodes normal. Resp: clear to auscultation bilaterally Back: symmetric, no curvature. ROM normal. No CVA tenderness. Cardio: regular rate and rhythm, S1, S2 normal, no murmur, click, rub or gallop GI: soft, non-tender; bowel sounds normal; no masses,  no organomegaly Extremities: extremities normal, atraumatic, no cyanosis or edema Neurologic: Alert and oriented X 3, normal strength and tone. Normal symmetric reflexes. Normal coordination and gait  ECOG PERFORMANCE STATUS: 1 - Symptomatic but completely ambulatory  Blood pressure 112/53, pulse 92, temperature 98.3 F (36.8 C), temperature source Oral, resp. rate 18, height '5\' 6"'  (1.676 m), weight 191 lb 14.4 oz (87.045 kg), SpO2 100 %.  LABORATORY DATA: Lab Results  Component Value Date   WBC 4.9 04/09/2016   HGB 8.5* 04/09/2016   HCT 27.3* 04/09/2016   MCV 96.1 04/09/2016   PLT 84* 04/09/2016      Chemistry      Component Value Date/Time   NA 141 04/09/2016 1253   NA 141 02/22/2016 1500   K 3.5 04/09/2016 1253   K 3.4* 02/22/2016 1500   CL 103 02/22/2016 1500   CL 105 06/17/2013 1441   CO2 26 04/09/2016 1253   CO2 24 02/22/2016 1500   BUN 13.7 04/09/2016 1253   BUN 7 02/22/2016 1500   CREATININE 1.0 04/09/2016 1253   CREATININE 0.96 02/22/2016 1500   CREATININE 0.76 01/26/2016 1646      Component Value Date/Time   CALCIUM 9.1 04/09/2016 1253   CALCIUM 9.5 02/22/2016 1500   ALKPHOS 56 04/09/2016 1253   ALKPHOS 52 01/26/2016 1646   AST 27 04/09/2016 1253   AST 14 01/26/2016 1646   ALT 28 04/09/2016 1253   ALT 14 01/26/2016 1646   BILITOT 1.73* 04/09/2016 1253   BILITOT 1.2 01/26/2016 1646       RADIOGRAPHIC STUDIES: No results found.  ASSESSMENT AND PLAN: This  is a very pleasant 65 years old [place] American female with history of multiple myeloma currently on  treatment with Revlimid and low-dose Decadron and tolerated her treatment fairly well. The patient is doing fine today with no specific complaints except for mild fatigue.  Because of disease progression, I recommended for her to continue her treatment with Revlimid and Decadron but I increased the dose of Revlimid to 25 mg by mouth daily for 21 days every 4 weeks, status post 11 months of this regimen. CBC today showed persistent anemia in addition to thrombocytopenia. Her serum bilirubin is also elevated. I discussed the lab result with the patient and recommended for her to continue her treatment as scheduled but we will delay the start of cycle #12 until 04/21/2016. She will come back for follow-up visit in 5 weeks for reevaluation with repeat CBC, comprehensive metabolic panel and LDH. The patient will continue her treatment with Zometa every 3 months as a scheduled. She was advised to call immediately if she has any concerning symptoms and interval. The patient voices understanding of current disease status and treatment options and is in agreement with the current care plan.  All questions were answered. The patient knows to call the clinic with any problems, questions or concerns. We can certainly see the patient much sooner if necessary.  Disclaimer: This note was dictated with voice recognition software. Similar sounding words can inadvertently be transcribed and may not be corrected upon review.

## 2016-04-09 NOTE — Telephone Encounter (Signed)
per pf to sch pt appt-gave pt copy of avs °

## 2016-04-17 DIAGNOSIS — Z9484 Stem cells transplant status: Secondary | ICD-10-CM | POA: Diagnosis not present

## 2016-04-17 DIAGNOSIS — C9001 Multiple myeloma in remission: Secondary | ICD-10-CM | POA: Diagnosis not present

## 2016-04-23 ENCOUNTER — Telehealth: Payer: Self-pay

## 2016-04-23 NOTE — Telephone Encounter (Signed)
Advised patient to seek care at the urgent care or ED due to symptoms.

## 2016-04-24 ENCOUNTER — Emergency Department (HOSPITAL_COMMUNITY)
Admission: EM | Admit: 2016-04-24 | Discharge: 2016-04-25 | Disposition: A | Discharge status: Home / Self Care | Payer: Medicare Other | Attending: Emergency Medicine | Admitting: Emergency Medicine

## 2016-04-24 ENCOUNTER — Emergency Department (HOSPITAL_COMMUNITY): Payer: Medicare Other

## 2016-04-24 ENCOUNTER — Encounter (HOSPITAL_COMMUNITY): Payer: Self-pay | Admitting: *Deleted

## 2016-04-24 ENCOUNTER — Telehealth: Payer: Self-pay | Admitting: Medical Oncology

## 2016-04-24 DIAGNOSIS — Z8579 Personal history of other malignant neoplasms of lymphoid, hematopoietic and related tissues: Secondary | ICD-10-CM | POA: Diagnosis not present

## 2016-04-24 DIAGNOSIS — E1165 Type 2 diabetes mellitus with hyperglycemia: Secondary | ICD-10-CM | POA: Diagnosis not present

## 2016-04-24 DIAGNOSIS — Z79899 Other long term (current) drug therapy: Secondary | ICD-10-CM | POA: Diagnosis not present

## 2016-04-24 DIAGNOSIS — Z86010 Personal history of colonic polyps: Secondary | ICD-10-CM | POA: Diagnosis not present

## 2016-04-24 DIAGNOSIS — R1011 Right upper quadrant pain: Secondary | ICD-10-CM | POA: Diagnosis not present

## 2016-04-24 DIAGNOSIS — R0789 Other chest pain: Secondary | ICD-10-CM | POA: Diagnosis not present

## 2016-04-24 DIAGNOSIS — Z8619 Personal history of other infectious and parasitic diseases: Secondary | ICD-10-CM | POA: Diagnosis not present

## 2016-04-24 DIAGNOSIS — Z8709 Personal history of other diseases of the respiratory system: Secondary | ICD-10-CM | POA: Insufficient documentation

## 2016-04-24 DIAGNOSIS — H409 Unspecified glaucoma: Secondary | ICD-10-CM | POA: Insufficient documentation

## 2016-04-24 DIAGNOSIS — R918 Other nonspecific abnormal finding of lung field: Secondary | ICD-10-CM | POA: Diagnosis not present

## 2016-04-24 DIAGNOSIS — Z7984 Long term (current) use of oral hypoglycemic drugs: Secondary | ICD-10-CM | POA: Diagnosis not present

## 2016-04-24 DIAGNOSIS — Z9484 Stem cells transplant status: Secondary | ICD-10-CM | POA: Diagnosis not present

## 2016-04-24 DIAGNOSIS — I10 Essential (primary) hypertension: Secondary | ICD-10-CM | POA: Insufficient documentation

## 2016-04-24 DIAGNOSIS — Z8739 Personal history of other diseases of the musculoskeletal system and connective tissue: Secondary | ICD-10-CM | POA: Diagnosis not present

## 2016-04-24 DIAGNOSIS — B351 Tinea unguium: Secondary | ICD-10-CM | POA: Diagnosis not present

## 2016-04-24 DIAGNOSIS — Z7982 Long term (current) use of aspirin: Secondary | ICD-10-CM | POA: Insufficient documentation

## 2016-04-24 DIAGNOSIS — K219 Gastro-esophageal reflux disease without esophagitis: Secondary | ICD-10-CM | POA: Insufficient documentation

## 2016-04-24 DIAGNOSIS — R1010 Upper abdominal pain, unspecified: Secondary | ICD-10-CM | POA: Diagnosis not present

## 2016-04-24 DIAGNOSIS — E119 Type 2 diabetes mellitus without complications: Secondary | ICD-10-CM | POA: Insufficient documentation

## 2016-04-24 DIAGNOSIS — Z85038 Personal history of other malignant neoplasm of large intestine: Secondary | ICD-10-CM | POA: Diagnosis not present

## 2016-04-24 LAB — COMPREHENSIVE METABOLIC PANEL
ALBUMIN: 3.2 g/dL — AB (ref 3.5–5.0)
ALK PHOS: 58 U/L (ref 38–126)
ALT: 14 U/L (ref 14–54)
AST: 14 U/L — AB (ref 15–41)
Anion gap: 11 (ref 5–15)
BUN: 7 mg/dL (ref 6–20)
CALCIUM: 9.8 mg/dL (ref 8.9–10.3)
CHLORIDE: 101 mmol/L (ref 101–111)
CO2: 26 mmol/L (ref 22–32)
CREATININE: 0.93 mg/dL (ref 0.44–1.00)
GFR calc non Af Amer: >60 mL/min (ref >60)
GLUCOSE: 106 mg/dL — AB (ref 65–99)
Potassium: 3.6 mmol/L (ref 3.5–5.1)
SODIUM: 138 mmol/L (ref 135–145)
Total Bilirubin: 1.3 mg/dL — ABNORMAL HIGH (ref 0.3–1.2)
Total Protein: 7.8 g/dL (ref 6.5–8.1)

## 2016-04-24 LAB — CBC
HCT: 28.7 % — ABNORMAL LOW (ref 36.0–46.0)
Hemoglobin: 8.8 g/dL — ABNORMAL LOW (ref 12.0–15.0)
MCH: 29.4 pg (ref 26.0–34.0)
MCHC: 30.7 g/dL (ref 30.0–36.0)
MCV: 96 fL (ref 78.0–100.0)
PLATELETS: 163 10*3/uL (ref 150–400)
RBC: 2.99 MIL/uL — AB (ref 3.87–5.11)
RDW: 17.6 % — ABNORMAL HIGH (ref 11.5–15.5)
WBC: 7 10*3/uL (ref 4.0–10.5)

## 2016-04-24 LAB — URINALYSIS, ROUTINE W REFLEX MICROSCOPIC
BILIRUBIN URINE: NEGATIVE
GLUCOSE, UA: NEGATIVE mg/dL
Ketones, ur: NEGATIVE mg/dL
Leukocytes, UA: NEGATIVE
Nitrite: NEGATIVE
PROTEIN: NEGATIVE mg/dL
SPECIFIC GRAVITY, URINE: 1.005 (ref 1.005–1.030)
pH: 5.5 (ref 5.0–8.0)

## 2016-04-24 LAB — URINE MICROSCOPIC-ADD ON

## 2016-04-24 LAB — LIPASE, BLOOD: LIPASE: 30 U/L (ref 11–51)

## 2016-04-24 MED ORDER — FENTANYL CITRATE (PF) 100 MCG/2ML IJ SOLN
50.0000 ug | Freq: Once | INTRAMUSCULAR | Status: AC
  Administered 2016-04-25: 50 ug via INTRAVENOUS
  Filled 2016-04-24: qty 2

## 2016-04-24 MED ORDER — IBUPROFEN 200 MG PO TABS
600.0000 mg | ORAL_TABLET | Freq: Once | ORAL | Status: AC
  Administered 2016-04-24: 600 mg via ORAL
  Filled 2016-04-24: qty 3

## 2016-04-24 MED ORDER — ONDANSETRON HCL 4 MG/2ML IJ SOLN
4.0000 mg | Freq: Once | INTRAMUSCULAR | Status: AC
  Administered 2016-04-25: 4 mg via INTRAVENOUS
  Filled 2016-04-24: qty 2

## 2016-04-24 NOTE — Telephone Encounter (Signed)
Requests appt with [facility] per [place].

## 2016-04-24 NOTE — ED Notes (Signed)
UNABLE TO COLLECT LABS AT THIS TIME PATIENT IN THE RESTROOM

## 2016-04-24 NOTE — Telephone Encounter (Signed)
Per [name] keep appts as scheduled. Pt notified.

## 2016-04-24 NOTE — ED Provider Notes (Signed)
CSN: [id number]     Arrival date & time 04/24/16  1845 History   First MD Initiated Contact with Patient 04/24/16 2156     Chief Complaint  Patient presents with  . Abdominal Pain     Patient is a 65 y.o. female presenting with abdominal pain. The history is provided by the patient. No language interpreter was used.  Abdominal Pain  Darlene Howard is a 65 y.o. female who presents to the Emergency Department complaining of right side pain.  2 days ago she developed right upper quadrant/right flank pain. Pain is waxing and waning in nature and described as a discomfort type sensation. Episodes of pain last 15-25 minutes at a time. It is slightly worse when she lays on her right side and feels better when she lays on her left side or raises her right arm. No fevers, chest pain, shortness of breath, vomiting, nausea, leg swelling or pain.  She has a history of multiple myeloma and takes medication for this. She also takes Coumadin to prevent clots. In February she had surgery to have her gallbladder removed. It was an outpatient procedure and she had no difficulties the recovery.  Past Medical History  Diagnosis Date  . Glaucoma     uses eye drops as instructed  . Personal history of colon cancer     ASCENDING COLON--  S/P  RIGHT HEMICOLECTOMY WITH NEGATIVE NODES/    NO RECURRENCE  . Herpes     takes Valtrex as needed  . GERD (gastroesophageal reflux disease)     takes Pantoprazole daily  . Hypertension     takes Amlodipine and Diovan daily  . Bruises easily     d/t being on COumadin  . History of colon polyps   . Type 2 diabetes mellitus ([employer])   . 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 Multiple myeloma DX  2006  STATE IIA,  IgA  KAPPA    STABLE  PER DR MOHAMED--  S/P STEM CELL TRANSPLANT 2007 /  RECURRENCE 2008  CHEMO ENDED 03-30-2008/   NOW ON MAINTANANCE REVLIMID )   . Vocal cord nodule     resolved- see Dr. Deeann Saint notes in media tab 06/17/14  . DDD (degenerative disc disease), lumbar   . Cholelithiasis   . Fatty liver    Past Surgical History  Procedure Laterality Date  . Colonoscopy  10/07/2006    CMK:LKJZPH rectum/Status post right hemicolectomy. Normal residual colon  . Cervical cone biopsy    . Cataract extraction w/phaco Left 01/20/2014    Procedure: CATARACT EXTRACTION PHACO AND INTRAOCULAR LENS PLACEMENT (IOC);  Surgeon: Adonis Brook, MD;  Location: [place];  Service: Ophthalmology;  Laterality: Left;  . 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 Co2  ablation vulva perianal and vaginal dysplatic areas  02-15-2010  . Abdominal hysterectomy  1983    w/  appendectomy and left salpingoophorectom  . Hemicolectomy Right 04-27-2005    CARCINOMA   ASCENDING COLON  . Vulvectomy N/A 05/06/2014    Procedure: WIDE LOCAL EXCISION LEFT LABIA MAJORA;  Surgeon: Janie Morning, MD;  Location: [facility];  Service: Gynecology;  Laterality: N/A;  . Vulvar lesion removal N/A 05/06/2014    Procedure: LASER OF VULVAR LESION;  Surgeon: Janie Morning, MD;  Location: [facility];  Service: Gynecology;  Laterality: N/A;  . Lesion removal N/A 05/06/2014    Procedure: LASER OF THE VAGINA;  Surgeon: Janie Morning, MD;  Location: [facility];  Service: Gynecology;  Laterality: N/A;  . Esophagogastroduodenoscopy  04/02/2005    BDZ:[id number] polyp with central erosion versus ulceration in the body of the stomach, resected and recovered Remainder of the gastric mucosa, D1, D2 appeared normal  . Colonoscopy  10/2009    RMR: diminutive RS polyp (hyperplastic).   . Colonoscopy N/A 12/15/2014    Procedure: COLONOSCOPY;  Surgeon: Daneil Dolin, MD;  [facility];  Service: Endoscopy;  Laterality: N/A;  130   . Cholecystectomy N/A 02/28/2016    Procedure: LAPAROSCOPIC CHOLECYSTECTOMY;  Surgeon: Ralene Ok, MD;  Location: [facility];  Service: General;  Laterality: N/A;   Family History  Problem Relation Age of Onset  . Kidney failure Mother   . Hypertension Mother   . Prostate cancer Father 36  . Hypertension Father     vascular disease   . Stroke Father   . Hypertension Sister   . Hypertension Sister   . Colon cancer Neg Hx   . Breast cancer Neg Hx   . Hypertension Sister   . Hyperlipidemia Sister   . Hypertension Brother   . Hyperlipidemia Brother    Social History  Substance Use Topics  . Smoking status: Never Smoker   . Smokeless tobacco: Never Used  . Alcohol Use: No   OB History    No data available     Review of Systems  Gastrointestinal: Positive for abdominal pain.  All other systems reviewed and are negative.     Allergies  Codeine and Tramadol  Home Medications   Prior to Admission medications   Medication Sig Start Date End Date Taking? Authorizing Provider  acetaminophen (TYLENOL) 500 MG tablet Take 500 mg by mouth daily as needed for moderate pain.   Yes Historical Provider, MD  amLODipine (NORVASC) 2.5 MG tablet Take 1 tablet by mouth  daily in the morning 03/02/16  Yes Fayrene Helper, MD  aspirin (ASPIRIN LOW DOSE) 81 MG EC tablet Take 81 mg by mouth daily.     Yes Historical Provider, MD  calcium-vitamin D (OSCAL 500/200 D-3) 500-200 MG-UNIT per tablet Take 1 tablet by mouth 3 (three) times daily.     Yes Historical Provider, MD  cholecalciferol (VITAMIN D) 1000 UNITS tablet Take 1,000 Units by mouth daily. Reported on 04/24/2016   Yes Historical Provider, MD  dexamethasone (DECADRON) 4 MG tablet TAKE 5 TABLETS BY MOUTH ON  A WEEKLY BASIS AS DIRECTED Patient taking differently: TAKE 20 MG BY MOUTH weekly on Thursday 12/05/15  Yes Curt Bears, MD  dorzolamide-timolol (COSOPT) 22.3-6.8 MG/ML ophthalmic solution Place 1 drop into the left eye 2 (two) times daily.  11/12/15  Yes Historical Provider, MD  gabapentin (NEURONTIN) 100 MG capsule Take 1  capsule by mouth 3  times daily Patient taking differently: Take 300 mg by mouth at bedtime 12/16/15  Yes Fayrene Helper, MD  lenalidomide (REVLIMID) 25 MG capsule Take 1 capsule (25 mg total) by mouth daily. 04/06/16  Yes Curt Bears, MD  loperamide (IMODIUM A-D) 2 MG tablet Take 2 mg by mouth daily as needed for diarrhea or loose stools.   Yes Historical Provider, MD  loratadine (CLARITIN) 10 MG tablet TAKE ONE TABLET BY MOUTH ONCE DAILY. Patient taking differently: TAKE 10 MG BY MOUTH ONCE DAILY. 03/21/16  Yes Fayrene Helper,  MD  LUMIGAN 0.01 % SOLN Place 1 drop into both eyes at bedtime.  12/13/15  Yes Historical Provider, MD  metFORMIN (GLUCOPHAGE) 500 MG tablet Take 1 tablet (500 mg total) by mouth daily with breakfast. 01/11/16  Yes Fayrene Helper, MD  Multiple Vitamins-Minerals (CENTRUM SILVER PO) Take 1 tablet by mouth daily.    Yes Historical Provider, MD  ONE TOUCH ULTRA TEST test strip USE AS DIRECTED TO CHECK DAILY.   Yes Fayrene Helper, MD  pantoprazole (PROTONIX) 40 MG tablet TAKE 1 TABLET BY MOUTH  DAILY FOR ACID REFLUX Patient taking differently: TAKE 40 MG BY MOUTH  DAILY FOR ACID REFLUX 01/24/16  Yes Fayrene Helper, MD  potassium chloride SA (K-DUR,KLOR-CON) 20 MEQ tablet Take 1 tablet by mouth  twice a day Patient taking differently: Take 20 meq by mouth twice daily 03/02/16  Yes Fayrene Helper, MD  PREMARIN vaginal cream PLACE 1 APPLICATORFUL VAGINALLY TWICE A WEEK. 03/02/16  Yes Dorothyann Gibbs, NP  valACYclovir (VALTREX) 1000 MG tablet Take 1 tablet by mouth  daily as needed Patient taking differently: Take 1 tablet by mouth  daily as needed for outbreaks 01/16/16  Yes Fayrene Helper, MD  valsartan-hydrochlorothiazide (DIOVAN-HCT) 320-25 MG tablet Take 1 tablet by mouth  daily 03/02/16  Yes Fayrene Helper, MD  warfarin (COUMADIN) 2 MG tablet Take 1 tablet by mouth  daily Patient taking differently: Take 2 mg by mouth daily 02/21/16  Yes Curt Bears, MD  ondansetron (ZOFRAN ODT) 4 MG disintegrating tablet Take 1 tablet (4 mg total) by mouth every 8 (eight) hours as needed for nausea or vomiting. Patient not taking: Reported on 04/09/2016 02/28/16   Ralene Ok, MD  ondansetron (ZOFRAN) 4 MG tablet Take 1 tablet (4 mg total) by mouth every 6 (six) hours. 04/25/16   Quintella Reichert, MD  oxyCODONE (ROXICODONE) 5 MG immediate release tablet Take 1 tablet (5 mg total) by mouth every 4 (four) hours as needed for severe pain. Patient not taking: Reported on 04/09/2016 02/28/16   Ralene Ok, MD  oxyCODONE-acetaminophen (PERCOCET/ROXICET) 5-325 MG tablet Take 1 tablet by mouth every 8 (eight) hours as needed for severe pain. 04/25/16   Quintella Reichert, MD   BP 132/59 mmHg  Pulse 70  Temp(Src) 98.2 F (36.8 C) (Oral)  Resp 16  Wt 193 lb (87.544 kg)  SpO2 100% Physical Exam  Constitutional: She is oriented to person, place, and time. She appears well-developed and well-nourished.  HENT:  Head: Normocephalic and atraumatic.  Cardiovascular: Normal rate and regular rhythm.   No murmur heard. Pulmonary/Chest: Effort normal and breath sounds normal. No respiratory distress. She exhibits no tenderness.  Abdominal: Soft. There is no tenderness. There is no rebound and no guarding.  Musculoskeletal: She exhibits no edema or tenderness.  Neurological: She is alert and oriented to person, place, and time.  Skin: Skin is warm and dry.  Psychiatric: She has a normal mood and affect. Her behavior is normal.  Nursing note and vitals reviewed.   ED Course  Procedures (including critical care time) Labs Review Labs Reviewed  COMPREHENSIVE METABOLIC PANEL - Abnormal; Notable for the following:    Glucose, Bld 106 (*)    Albumin 3.2 (*)    AST 14 (*)    Total Bilirubin 1.3 (*)    All other components within normal limits  CBC - Abnormal; Notable for the following:    RBC 2.99 (*)    Hemoglobin 8.8 (*)  HCT 28.7 (*)    RDW 17.6 (*)     All other components within normal limits  URINALYSIS, ROUTINE W REFLEX MICROSCOPIC (NOT AT [facility]) - Abnormal; Notable for the following:    APPearance CLOUDY (*)    Hgb urine dipstick SMALL (*)    All other components within normal limits  URINE MICROSCOPIC-ADD ON - Abnormal; Notable for the following:    Squamous Epithelial / LPF 6-30 (*)    Bacteria, UA RARE (*)    All other components within normal limits  PROTIME-INR - Abnormal; Notable for the following:    Prothrombin Time 15.5 (*)    All other components within normal limits  LIPASE, BLOOD  I-STAT TROPOININ, ED    Imaging Review Dg Chest 2 View  04/24/2016  CLINICAL DATA:  Right flank pain EXAM: CHEST  2 VIEW COMPARISON:  12/05/2013 FINDINGS: Rounded 5.6 cm mass over the right chest which is newly seen, peripheral or subpleural. There is suggestion of a 1 cm left lung nodule. Appearance concerning for malignancy/metastatic disease. No associated air bronchograms. Normal heart size and mediastinal contours. No acute osseous finding. IMPRESSION: 6 cm mass over the right chest. Possible 1 cm left lung nodule. Recommend chest CT. Electronically Signed   By: Monte Fantasia M.D.   On: 04/24/2016 23:15   Ct Chest W Contrast  04/25/2016  CLINICAL DATA:  Right-sided chest pain. EXAM: CT CHEST WITH CONTRAST TECHNIQUE: Multidetector CT imaging of the chest was performed during intravenous contrast administration. CONTRAST:  15m ISOVUE-300 IOPAMIDOL (ISOVUE-300) INJECTION 61% COMPARISON:  None. FINDINGS: THORACIC INLET/BODY WALL: No acute abnormality. MEDIASTINUM: Mediastinal adenopathy with 13 mm subcarinal node and rounded esophageal hiatus node measuring 15 mm. Bilateral posterior mediastinal nodules, greater on the right at the level of the destructive rib mass where a discrete nodule measures up to 3 cm. The rib mass is discrete from these posterior mediastinal nodules. Normal heart size. Atherosclerosis, including the coronary arteries. No  acute vascular finding LUNG WINDOWS: Mild dependent atelectasis.  No primary mass lesion is seen. 5 mm right upper lobe pulmonary nodule. Trace pleural effusions. UPPER ABDOMEN: No acute findings. OSSEOUS: Large posterior right sixth rib mass with extraosseous subpleural growth accounting for the chest x-ray abnormality. The mass measures up to 52 mm in maximal axial dimension. There are are occasional tiny cortical lucencies which are likely myelomatous. Approximately 1 cm lytic lesion in the lateral margin right scapula. Marked osteopenia IMPRESSION: 1. The right chest mass is a large destructive sixth rib lesion. This and other lytic bone lesions are presumably related to patient's history of myeloma. 2. Posterior mediastinal nodules and mediastinal adenopathy could be myelomatous, lymphoma or less likely metastatic disease not excluded. 3. 5 mm upper lobe pulmonary nodule, attention on surveillance imaging. Electronically Signed   By: JMonte FantasiaM.D.   On: 04/25/2016 01:22   Ct Renal Stone Study  04/24/2016  CLINICAL DATA:  Acute onset of upper abdominal pain, radiating to the right and about the back. Initial encounter. EXAM: CT ABDOMEN AND PELVIS WITHOUT CONTRAST TECHNIQUE: Multidetector CT imaging of the abdomen and pelvis was performed following the standard protocol without IV contrast. COMPARISON:  CT of the chest, abdomen and pelvis from 06/02/2010, and MRI of the lumbar spine performed 03/01/2014 FINDINGS: Trace right-sided pleural fluid is noted. Minimal bibasilar atelectasis or scarring is seen. The liver and spleen are unremarkable in appearance. The patient is status post cholecystectomy. The pancreas and adrenal glands are unremarkable. Mild nonspecific perinephric stranding  is noted bilaterally. There is no evidence of hydronephrosis. No renal or ureteral stones are identified. No free fluid is identified. The small bowel is unremarkable in appearance. The stomach is within normal limits.  No acute vascular abnormalities are seen. A small umbilical hernia is seen, containing only fat. The patient is status post partial colectomy, with an ileocolic anastomosis at the proximal transverse colon. The anastomosis is unremarkable in appearance. The remaining colon is unremarkable in appearance. The bladder is decompressed and not well assessed. The patient is status post hysterectomy. No suspicious adnexal masses are seen. No inguinal lymphadenopathy is seen. No acute osseous abnormalities are identified. There is grade 1 anterolisthesis of L4 on L5, with underlying facet disease. There is mild chronic loss of height at the superior endplate of L2. Lateral osteophytes are noted along the lower thoracic spine. IMPRESSION: 1. No acute abnormality seen to explain the patient's symptoms. 2. Trace right-sided pleural fluid noted. Minimal bibasilar atelectasis or scarring seen. 3. Small umbilical hernia, containing only fat. 4. Ileocolic anastomosis is unremarkable in appearance. 5. Mild degenerative change along the lumbar spine. Electronically Signed   By: Garald Balding M.D.   On: 04/24/2016 23:37   I have personally reviewed and evaluated these images and lab results as part of my medical decision-making.   EKG Interpretation   Date/Time:  Tuesday April 24 2016 19:24:55 EDT Ventricular Rate:  83 PR Interval:  157 QRS Duration: 94 QT Interval:  386 QTC Calculation: 453 R Axis:   -37 Text Interpretation:  Sinus rhythm Atrial premature complex Left axis  deviation Abnormal R-wave progression, late transition Confirmed by Hazle Coca [phone]) on 04/24/2016 7:27:48 PM Also confirmed by Hazle Coca [phone]),  editor WALKER, CCT, [place] ([id number])  on 04/25/2016 7:26:15 AM      MDM   Final diagnoses:  Right-sided chest wall pain    Pt here with right flank pain, hx/o MM.  CT stone negative for stone.  CXR with chest mass.  CT chest mass concerning for MM.  D/w pt findings of studies, need for close  Oncology follow up, home care, return precautions.      Quintella Reichert, MD 04/25/16 1455

## 2016-04-24 NOTE — ED Notes (Signed)
Pt c/o upper abd pain that radiates to the right and around to her back; pt states that the pain is intermittent and differs in intensity; pt denies N/V; pt denies shortness of breath; pt states that she just recently had her gallbladder removed in Feb; pt states that the pain gets worse after eating; pt states that she has had decreased appetite since the pain began

## 2016-04-24 NOTE — ED Notes (Signed)
Pt transported to [facility], lab draw delayed.

## 2016-04-24 NOTE — ED Notes (Signed)
Pt reports that her Oncologist advised her that her Cancer is worse and she is concerned that the pain is something to do with her Cancer

## 2016-04-25 ENCOUNTER — Encounter (HOSPITAL_COMMUNITY): Payer: Self-pay

## 2016-04-25 ENCOUNTER — Telehealth: Payer: Self-pay | Admitting: Internal Medicine

## 2016-04-25 ENCOUNTER — Telehealth: Payer: Self-pay | Admitting: Medical Oncology

## 2016-04-25 ENCOUNTER — Other Ambulatory Visit: Payer: Self-pay | Admitting: Internal Medicine

## 2016-04-25 DIAGNOSIS — R918 Other nonspecific abnormal finding of lung field: Secondary | ICD-10-CM | POA: Diagnosis not present

## 2016-04-25 DIAGNOSIS — R0789 Other chest pain: Secondary | ICD-10-CM | POA: Diagnosis not present

## 2016-04-25 LAB — I-STAT TROPONIN, ED: TROPONIN I, POC: 0 ng/mL (ref 0.00–0.08)

## 2016-04-25 LAB — PROTIME-INR
INR: 1.21 (ref 0.00–1.49)
Prothrombin Time: 15.5 seconds — ABNORMAL HIGH (ref 11.6–15.2)

## 2016-04-25 MED ORDER — ONDANSETRON HCL 4 MG PO TABS: 4.0000 mg | ORAL_TABLET | Freq: Four times a day (QID) | ORAL | Status: DC

## 2016-04-25 MED ORDER — IOPAMIDOL (ISOVUE-300) INJECTION 61%
75.0000 mL | Freq: Once | INTRAVENOUS | Status: AC | PRN
  Administered 2016-04-25: 75 mL via INTRAVENOUS

## 2016-04-25 MED ORDER — IOPAMIDOL (ISOVUE-370) INJECTION 76%: 75.0000 mL | Freq: Once | INTRAVENOUS | Status: DC | PRN

## 2016-04-25 MED ORDER — OXYCODONE-ACETAMINOPHEN 5-325 MG PO TABS: 1.0000 | ORAL_TABLET | Freq: Three times a day (TID) | ORAL | Status: DC | PRN

## 2016-04-25 NOTE — Telephone Encounter (Signed)
I let a message for pt to return my call about appt this Friday at 0900 with mohamed and offered to move up her zometa appt .

## 2016-04-25 NOTE — Discharge Instructions (Signed)
Your INR was 1.2 today.  Contact the coumadin office for adjustments in your dosing.     Chest Wall Pain Chest wall pain is pain in or around the bones and muscles of your chest. Sometimes, an injury causes this pain. Sometimes, the cause may not be known. This pain may take several weeks or longer to get better. HOME CARE INSTRUCTIONS  Pay attention to any changes in your symptoms. Take these actions to help with your pain:   Rest as told by your health care provider.   Avoid activities that cause pain. These include any activities that use your chest muscles or your abdominal and side muscles to lift heavy items.   If directed, apply ice to the painful area:  Put ice in a plastic bag.  Place a towel between your skin and the bag.  Leave the ice on for 20 minutes, 2-3 times per day.  Take over-the-counter and prescription medicines only as told by your health care provider.  Do not use tobacco products, including cigarettes, chewing tobacco, and e-cigarettes. If you need help quitting, ask your health care provider.  Keep all follow-up visits as told by your health care provider. This is important. SEEK MEDICAL CARE IF:  You have a fever.  Your chest pain becomes worse.  You have new symptoms. SEEK IMMEDIATE MEDICAL CARE IF:  You have nausea or vomiting.  You feel sweaty or light-headed.  You have a cough with phlegm (sputum) or you cough up blood.  You develop shortness of breath.   This information is not intended to replace advice given to you by your health care provider. Make sure you discuss any questions you have with your health care provider.   Document Released: 12/17/2005 Document Revised: 09/07/2015 Document Reviewed: 03/14/2015 Elsevier Interactive Patient Education 2016 [employer]. Multiple Myeloma Multiple myeloma is a form of cancer that results from the uncontrolled growth of abnormal plasma cells. Plasma cells are a type of white blood cell  produced in the soft tissue inside bones (bone marrow). These are cells in your blood that normally help you fight infection. They are part of your body's defense system (immune system). Plasma cells that become cancerous will grow out of control. As a result, they interfere with normal blood cells and many important functions that normal cells perform in your body. With multiple myeloma, the abnormal plasma cells cause multiple tumors to form. Multiple myeloma damages your bones and causes other health problems because of its effect on blood cells. The disease progresses and reduces your ability to fight off infections. CAUSES The cause of multiple myeloma is not known. RISK FACTORS Risk factors include:  Being older than 28.  Being African American.  Having a family history of the disease. SIGNS AND SYMPTOMS Signs and symptoms of multiple myeloma may include:  Bone pain, especially in the back, ribs, and hips.  Broken bones (fractures).  Low blood counts, including reduced red blood cells (anemia), reduced white blood cells (leukopenia), and reduced platelets.  Fatigue.  Weakness.  Infections.  Bleeding, such as bleeding from the nose or gums, or increased bleeding from a scrape or cut.  High blood calcium levels.  Increased urination.  Confusion. DIAGNOSIS Your health care provider will do a physical exam and take your medical history. Tests will be done to help confirm the diagnosis. Tests may include:  Blood tests.  Urine tests.  X-rays.  MRI.  Bone marrow biopsy. In this test, a sample of marrow is removed  from one of your bones. The sample is viewed under a microscope to check for abnormal plasma cells. TREATMENT There is no cure for multiple myeloma. Treatment options may vary depending on how much the disease has advanced. Possible treatment options may include:  Medicines that kill cancer cells (chemotherapy).  Medicines that help prevent bone damage  (bisphosphonates).  Radiation therapy. High-energy rays are used to kill cancer cells.  Surgery. This may be done to repair damage to bone.  Targeted drug therapy. These medicines block the growth and spread of cancer cells.  Immunotherapy. This is also called biologic therapy. It involves the use of medicines to strengthen the ability of your immune system to fight cancer cells.  Stem cell transplant. Healthy stem cells are infused into your body. These stem cells produce new blood cells to replace those killed by the disease or by other treatments. The healthy cells that are transplanted may be your own or may come from another person.  Plasmapheresis. This is a procedure used to remove plasma cells from your blood.  Other medicines to treat problems such as infections or pain. HOME CARE INSTRUCTIONS  Take medicines only as directed by your health care provider.  Drink enough fluid to keep your urine clear or pale yellow.  Eat a well-balanced diet. Work with a dietitian to make sure you are getting the nutrition you need.  Take vitamins or dietary supplements as directed by your health care provider or dietitian.  Stay active. Talk to your health care provider about what types of exercises and activities are safe for you.  Avoid activities that cause increased pain.  Do not lift anything heavier than 10 lb (4.5 kg).  Consider joining a support group or seeking counseling to help you cope with the stress of having multiple myeloma.  Keep all follow-up visits as directed by your health care provider. This is important. SEEK MEDICAL CARE IF:  Your pain is not controlled with medicine or is getting worse.  You have a fever.  You have swollen legs.  You have weakness or dizziness.  You have unexplained weight loss.  You have unexplained bleeding or bruising.  You have a cough or cold symptoms.  You feel depressed.  You have changes in urination or bowel  movements. SEEK IMMEDIATE MEDICAL CARE IF:  You have sudden severe pain, especially back pain.  You have numbness or weakness in your arms, hands, legs, or feet.  You have confusion.  You have weakness on one side of your body.  You have slurred speech.  You have trouble staying awake.  You have shortness of breath.  You have blood in your stool or urine.  You vomit or cough up blood.   This information is not intended to replace advice given to you by your health care provider. Make sure you discuss any questions you have with your health care provider.   Document Released: 09/11/2001 Document Revised: 01/07/2015 Document Reviewed: 07/20/2014 Elsevier Interactive Patient Education [employer].

## 2016-04-25 NOTE — Telephone Encounter (Signed)
I returned pts call . Darlene Howard is having increased pain in right chest. " My coumadin level low , mass on right rib . I did not know the myeloma spread to the right side". Per Mohamed appt for Friday at 0900.

## 2016-04-25 NOTE — Telephone Encounter (Signed)
Added appt per pof...the patient aware

## 2016-04-26 ENCOUNTER — Telehealth: Payer: Self-pay

## 2016-04-26 ENCOUNTER — Telehealth: Payer: Self-pay | Admitting: Medical Oncology

## 2016-04-26 ENCOUNTER — Other Ambulatory Visit: Payer: Self-pay | Admitting: Gynecologic Oncology

## 2016-04-26 NOTE — Telephone Encounter (Signed)
Patient called requesting to speak to MD's nurse regarding her INR's, which supposedly are not controlled at the present time due to "being in the hospital".

## 2016-04-26 NOTE — Telephone Encounter (Signed)
Stay on same dose of coumadin per [facility].

## 2016-04-27 ENCOUNTER — Telehealth: Payer: Self-pay | Admitting: *Deleted

## 2016-04-27 ENCOUNTER — Encounter: Payer: Self-pay | Admitting: Internal Medicine

## 2016-04-27 ENCOUNTER — Ambulatory Visit (HOSPITAL_BASED_OUTPATIENT_CLINIC_OR_DEPARTMENT_OTHER): Payer: Medicare Other | Admitting: Internal Medicine

## 2016-04-27 ENCOUNTER — Telehealth: Payer: Self-pay | Admitting: Internal Medicine

## 2016-04-27 ENCOUNTER — Ambulatory Visit (HOSPITAL_BASED_OUTPATIENT_CLINIC_OR_DEPARTMENT_OTHER): Payer: Medicare Other

## 2016-04-27 ENCOUNTER — Other Ambulatory Visit: Payer: Self-pay | Admitting: Medical Oncology

## 2016-04-27 VITALS — BP 125/52 | HR 101 | Temp 98.3°F | Resp 18 | Ht 66.0 in | Wt 185.3 lb

## 2016-04-27 DIAGNOSIS — C9000 Multiple myeloma not having achieved remission: Secondary | ICD-10-CM

## 2016-04-27 DIAGNOSIS — Z9484 Stem cells transplant status: Secondary | ICD-10-CM

## 2016-04-27 DIAGNOSIS — R530 Neoplastic (malignant) related fatigue: Secondary | ICD-10-CM

## 2016-04-27 MED ORDER — DEXAMETHASONE 4 MG PO TABS: ORAL_TABLET | ORAL | Status: DC

## 2016-04-27 MED ORDER — OXYCODONE-ACETAMINOPHEN 5-325 MG PO TABS: 1.0000 | ORAL_TABLET | Freq: Three times a day (TID) | ORAL | Status: DC | PRN

## 2016-04-27 MED ORDER — ZOLEDRONIC ACID 4 MG/100ML IV SOLN
4.0000 mg | Freq: Once | INTRAVENOUS | Status: AC
  Administered 2016-04-27: 4 mg via INTRAVENOUS
  Filled 2016-04-27: qty 100

## 2016-04-27 MED ORDER — SODIUM CHLORIDE 0.9 % IV SOLN
Freq: Once | INTRAVENOUS | Status: AC
  Administered 2016-04-27: 09:00:00 via INTRAVENOUS

## 2016-04-27 NOTE — Telephone Encounter (Signed)
Gave and prnted appt sched and avs fo rpt for May and June

## 2016-04-27 NOTE — Patient Instructions (Signed)
Zoledronic Acid injection (Hypercalcemia, Oncology) (Zometa) What is this medicine? ZOLEDRONIC ACID (ZOE le dron ik AS id) lowers the amount of calcium loss from bone. It is used to treat too much calcium in your blood from cancer. It is also used to prevent complications of cancer that has spread to the bone. This medicine may be used for other purposes; ask your health care provider or pharmacist if you have questions. What should I tell my health care provider before I take this medicine? They need to know if you have any of these conditions: -aspirin-sensitive asthma -cancer, especially if you are receiving medicines used to treat cancer -dental disease or wear dentures -infection -kidney disease -receiving corticosteroids like dexamethasone or prednisone -an unusual or allergic reaction to zoledronic acid, other medicines, foods, dyes, or preservatives -pregnant or trying to get pregnant -breast-feeding How should I use this medicine? This medicine is for infusion into a vein. It is given by a health care professional in a hospital or clinic setting. Talk to your pediatrician regarding the use of this medicine in children. Special care may be needed. Overdosage: If you think you have taken too much of this medicine contact a poison control center or emergency room at once. NOTE: This medicine is only for you. Do not share this medicine with others. What if I miss a dose? It is important not to miss your dose. Call your doctor or health care professional if you are unable to keep an appointment. What may interact with this medicine? -certain antibiotics given by injection -NSAIDs, medicines for pain and inflammation, like ibuprofen or naproxen -some diuretics like bumetanide, furosemide -teriparatide -thalidomide This list may not describe all possible interactions. Give your health care provider a list of all the medicines, herbs, non-prescription drugs, or dietary supplements you  use. Also tell them if you smoke, drink alcohol, or use illegal drugs. Some items may interact with your medicine. What should I watch for while using this medicine? Visit your doctor or health care professional for regular checkups. It may be some time before you see the benefit from this medicine. Do not stop taking your medicine unless your doctor tells you to. Your doctor may order blood tests or other tests to see how you are doing. Women should inform their doctor if they wish to become pregnant or think they might be pregnant. There is a potential for serious side effects to an unborn child. Talk to your health care professional or pharmacist for more information. You should make sure that you get enough calcium and vitamin D while you are taking this medicine. Discuss the foods you eat and the vitamins you take with your health care professional. Some people who take this medicine have severe bone, joint, and/or muscle pain. This medicine may also increase your risk for jaw problems or a broken thigh bone. Tell your doctor right away if you have severe pain in your jaw, bones, joints, or muscles. Tell your doctor if you have any pain that does not go away or that gets worse. Tell your dentist and dental surgeon that you are taking this medicine. You should not have major dental surgery while on this medicine. See your dentist to have a dental exam and fix any dental problems before starting this medicine. Take good care of your teeth while on this medicine. Make sure you see your dentist for regular follow-up appointments. What side effects may I notice from receiving this medicine? Side effects that you should report  to your doctor or health care professional as soon as possible: -allergic reactions like skin rash, itching or hives, swelling of the face, lips, or tongue -anxiety, confusion, or depression -breathing problems -changes in vision -eye pain -feeling faint or lightheaded,  falls -jaw pain, especially after dental work -mouth sores -muscle cramps, stiffness, or weakness -redness, blistering, peeling or loosening of the skin, including inside the mouth -trouble passing urine or change in the amount of urine Side effects that usually do not require medical attention (report to your doctor or health care professional if they continue or are bothersome): -bone, joint, or muscle pain -constipation -diarrhea -fever -hair loss -irritation at site where injected -loss of appetite -nausea, vomiting -stomach upset -trouble sleeping -trouble swallowing -weak or tired This list may not describe all possible side effects. Call your doctor for medical advice about side effects. You may report side effects to FDA at 1-800-FDA-1088. Where should I keep my medicine? This drug is given in a hospital or clinic and will not be stored at home. NOTE: This sheet is a summary. It may not cover all possible information. If you have questions about this medicine, talk to your doctor, pharmacist, or health care provider.    2016, Elsevier/Gold Standard. (2014-05-15 14:19:39)

## 2016-04-27 NOTE — Telephone Encounter (Signed)
Per staff message and POF I have scheduled appts. Advised scheduler of appts. JMW  

## 2016-04-27 NOTE — Progress Notes (Signed)
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  2) the patient had evidence for disease recurrence in August of 2008 and she was treated with 6 cycles of Velcade completed on 03/30/2008 with response to the treatment but was discontinued secondary to neuropathy.  3) maintenance treatment with Revlimid 10 mg by mouth daily by the does was later increase to 15 mg by mouth daily secondary to biochemical recurrence with stabilization of her disease. The treatment with Revlimid 15 mg by mouth daily was discontinued secondary to evidence for disease progression. 4) Revlimid 25 mg by mouth daily for 3 weeks every 4 weeks in addition to Decadron currently at 20 mg by mouth on a weekly basis. She is status post 12 cycles. This was discontinued secondary to disease progression.   CURRENT THERAPY:  1) systemic chemotherapy with Daratumumab 16 MG/KG weekly for the first 9 weeks, Velcade 1.3 MG/M2 subcutaneously on days 1, 4, 8 and 11 every 3 weeks in addition to Decadron 20 mg by mouth on days 1, 2, 4, 5, 8, 9, 11 and 12 every 3 weeks. First dose 05/07/2016 2) Zometa 4 mg IV every 3 months. First dose in 05/05/2015  ADVANCED DIRECTIVE: She does not have advanced directive and was given information.  INTERVAL HISTORY: KIONI STAHL 65 y.o. female returns to the clinic today for routine monthly followup visit accompanied by her brother and daughter-in-law. The patient tolerated her previous course of Revlimid and Decadron fairly well except for fatigue. She was seen recently by Dr. Bearl Mulberry at [facility] and had repeat blood work showed evidence for disease progression. She was also recently seen at the emergency department at [facility] complaining of right sided chest pain and CT of the chest showed right chest wall mass with a large destructive sixth rib lesion in addition to posterior mediastinal nodules and mediastinal lymphadenopathy secondary to myelomatous changes. The patient was given prescription for Percocet and she felt much better. She is requesting refill of her pain medication. She was not eligible for any clinical trial at [facility] and Dr. Bearl Mulberry recommended for the patient had a regimen of Daratumumab, Velcade and Decadron. She is here today for discussion of this option and to receive her treatment locally. The patient denied having any fever or chills. She denied having any shortness of breath, cough or hemoptysis. She has no weight loss or night sweats.   MEDICAL HISTORY: Past Medical History  Diagnosis Date  . Glaucoma     uses eye drops as instructed  . Personal history of colon cancer     ASCENDING COLON--  S/P  RIGHT HEMICOLECTOMY WITH NEGATIVE NODES/    NO RECURRENCE  . Herpes     takes Valtrex as needed  . GERD (gastroesophageal reflux disease)     takes Pantoprazole daily  . Hypertension     takes Amlodipine and Diovan daily  . Bruises easily     d/t being on COumadin  . History of colon polyps   . Type 2 diabetes mellitus ([place])   . 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 H/O stem cell transplant ([place])     04-26-2006  AT [facility]  . Multiple myeloma DX  2006  STATE IIA,  IgA  KAPPA    STABLE  PER DR Marni Franzoni--  S/P STEM CELL TRANSPLANT 2007 /  RECURRENCE 2008  CHEMO ENDED 03-30-2008/   NOW ON MAINTANANCE REVLIMID )  . Vocal cord nodule     resolved- see Dr. Deeann Saint notes in media tab 06/17/14  . DDD (degenerative disc disease), lumbar   . Cholelithiasis   . Fatty liver      ALLERGIES:  is allergic to codeine and tramadol.  MEDICATIONS:  Current Outpatient Prescriptions  Medication Sig Dispense Refill  . acetaminophen (TYLENOL) 500 MG tablet Take 500 mg by mouth daily as needed for moderate pain.    Marland Kitchen amLODipine (NORVASC) 2.5 MG tablet Take 1 tablet by mouth  daily in the morning 90 tablet 0  . aspirin (ASPIRIN LOW DOSE) 81 MG EC tablet Take 81 mg by mouth daily.      . calcium-vitamin D (OSCAL 500/200 D-3) 500-200 MG-UNIT per tablet Take 1 tablet by mouth 3 (three) times daily.      . cholecalciferol (VITAMIN D) 1000 UNITS tablet Take 1,000 Units by mouth daily. Reported on 04/24/2016    . dexamethasone (DECADRON) 4 MG tablet TAKE 5 TABLETS BY MOUTH ON  A WEEKLY BASIS AS DIRECTED (Patient taking differently: TAKE 20 MG BY MOUTH weekly on Thursday) 80 tablet 1  . dorzolamide-timolol (COSOPT) 22.3-6.8 MG/ML ophthalmic solution Place 1 drop into the left eye 2 (two) times daily.     Marland Kitchen gabapentin (NEURONTIN) 100 MG capsule Take 1 capsule by mouth 3  times daily (Patient taking differently: Take 300 mg by mouth at bedtime) 270 capsule 0  . lenalidomide (REVLIMID) 25 MG capsule Take 1 capsule (25 mg total) by mouth daily. 21 capsule 1  . loperamide (IMODIUM A-D) 2 MG tablet Take 2 mg by mouth daily as needed for diarrhea or loose stools.    Marland Kitchen loratadine (CLARITIN) 10 MG tablet TAKE ONE TABLET BY MOUTH ONCE DAILY. (Patient taking differently: TAKE 10 MG BY MOUTH ONCE DAILY.) 30 tablet 3  . LUMIGAN 0.01 % SOLN Place 1 drop into both eyes at bedtime.     . metFORMIN (GLUCOPHAGE) 500 MG tablet Take 1 tablet (500 mg total) by mouth daily with breakfast. 90 tablet 3  . Multiple Vitamins-Minerals (CENTRUM SILVER PO) Take 1 tablet by mouth daily.     . ONE TOUCH ULTRA TEST test strip USE AS DIRECTED TO CHECK DAILY. 50 each 3  . oxyCODONE-acetaminophen (PERCOCET/ROXICET) 5-325 MG tablet Take 1 tablet by mouth every 8 (eight) hours as needed for severe pain. 40 tablet 0  .  pantoprazole (PROTONIX) 40 MG tablet TAKE 1 TABLET BY MOUTH  DAILY FOR ACID REFLUX (Patient taking differently: TAKE 40 MG BY MOUTH  DAILY FOR ACID REFLUX) 90 tablet 0  . potassium chloride SA (K-DUR,KLOR-CON) 20 MEQ tablet Take 1 tablet by mouth  twice a day (Patient taking differently: Take 20 meq by mouth twice daily) 180 tablet 0  . PREMARIN vaginal cream PLACE 1 APPLICATORFUL VAGINALLY TWICE A WEEK. 30 g 0  . valACYclovir (VALTREX) 1000 MG tablet Take 1 tablet by mouth  daily as needed (Patient taking differently: Take 1 tablet by mouth  daily as needed for outbreaks) 90 tablet 1  . warfarin (COUMADIN) 2 MG tablet Take 1 tablet by mouth  daily (  Patient taking differently: Take 2 mg by mouth daily) 90 tablet 0  . ondansetron (ZOFRAN ODT) 4 MG disintegrating tablet Take 1 tablet (4 mg total) by mouth every 8 (eight) hours as needed for nausea or vomiting. (Patient not taking: Reported on 04/09/2016) 20 tablet 0  . ondansetron (ZOFRAN) 4 MG tablet Take 1 tablet (4 mg total) by mouth every 6 (six) hours. (Patient not taking: Reported on 04/27/2016) 10 tablet 0  . oxyCODONE (ROXICODONE) 5 MG immediate release tablet Take 1 tablet (5 mg total) by mouth every 4 (four) hours as needed for severe pain. (Patient not taking: Reported on 04/09/2016) 30 tablet 0  . valsartan-hydrochlorothiazide (DIOVAN-HCT) 320-25 MG tablet Take 1 tablet by mouth  daily 90 tablet 0   No current facility-administered medications for this visit.    SURGICAL HISTORY:  Past Surgical History  Procedure Laterality Date  . Colonoscopy  10/07/2006    AJO:INOMVE rectum/Status post right hemicolectomy. Normal residual colon  . Cervical cone biopsy    . Cataract extraction w/phaco Left 01/20/2014    Procedure: CATARACT EXTRACTION PHACO AND INTRAOCULAR LENS PLACEMENT (IOC);  Surgeon: Adonis Brook, MD;  Location: [place];  Service: Ophthalmology;  Laterality: Left;  . 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 Co2  ablation  vulva perianal and vaginal dysplatic areas  02-15-2010  . Abdominal hysterectomy  1983    w/  appendectomy and left salpingoophorectom  . Hemicolectomy Right 04-27-2005    CARCINOMA   ASCENDING COLON  . Vulvectomy N/A 05/06/2014    Procedure: WIDE LOCAL EXCISION LEFT LABIA MAJORA;  Surgeon: Janie Morning, MD;  Location: [facility];  Service: Gynecology;  Laterality: N/A;  . Vulvar lesion removal N/A 05/06/2014    Procedure: LASER OF VULVAR LESION;  Surgeon: Janie Morning, MD;  Location: [facility];  Service: Gynecology;  Laterality: N/A;  . Lesion removal N/A 05/06/2014    Procedure: LASER OF THE VAGINA;  Surgeon: Janie Morning, MD;  Location: [facility];  Service: Gynecology;  Laterality: N/A;  . Esophagogastroduodenoscopy  04/02/2005    [facility]:NOBSJG/GEZMOQHUTMLY polyp with central erosion versus ulceration in the body of the stomach, resected and recovered Remainder of the gastric mucosa, D1, D2 appeared normal  . Colonoscopy  10/2009    RMR: diminutive RS polyp (hyperplastic).   . Colonoscopy N/A 12/15/2014    Procedure: COLONOSCOPY;  Surgeon: Daneil Dolin, MD;  [facility];  Service: Endoscopy;  Laterality: N/A;  130   . Cholecystectomy N/A 02/28/2016    Procedure: LAPAROSCOPIC CHOLECYSTECTOMY;  Surgeon: Ralene Ok, MD;  Location: [place];  Service: General;  Laterality: N/A;    REVIEW OF SYSTEMS:  Constitutional: positive for fatigue Eyes: negative Ears, nose, mouth, throat, and face: negative Respiratory: positive for pleurisy/chest pain Cardiovascular: negative Gastrointestinal: negative Genitourinary:negative Integument/breast: negative Hematologic/lymphatic: negative Musculoskeletal:negative Neurological: negative Behavioral/Psych: negative Endocrine: negative Allergic/Immunologic: negative   PHYSICAL EXAMINATION: General appearance: alert, cooperative and no distress Head: Normocephalic, without obvious  abnormality, atraumatic Neck: no adenopathy Lymph nodes: Cervical, supraclavicular, and axillary nodes normal. Resp: clear to auscultation bilaterally Back: symmetric, no curvature. ROM normal. No CVA tenderness. Cardio: regular rate and rhythm, S1, S2 normal, no murmur, click, rub or gallop GI: soft, non-tender; bowel sounds normal; no masses,  no organomegaly Extremities: extremities normal, atraumatic, no cyanosis or edema Neurologic: Alert and oriented X 3, normal strength and tone. Normal symmetric reflexes. Normal coordination and gait  ECOG PERFORMANCE STATUS: 1 -  Symptomatic but completely ambulatory  Blood pressure 125/52, pulse 101, temperature 98.3 F (36.8 C), temperature source Oral, resp. rate 18, height 5\' 6"  (1.676 m), weight 185 lb 4.8 oz (84.052 kg), SpO2 100 %.  LABORATORY DATA: Lab Results  Component Value Date   WBC 7.0 04/24/2016   HGB 8.8* 04/24/2016   HCT 28.7* 04/24/2016   MCV 96.0 04/24/2016   PLT 163 04/24/2016      Chemistry      Component Value Date/Time   NA 138 04/24/2016 2003   NA 141 04/09/2016 1253   K 3.6 04/24/2016 2003   K 3.5 04/09/2016 1253   CL 101 04/24/2016 2003   CL 105 06/17/2013 1441   CO2 26 04/24/2016 2003   CO2 26 04/09/2016 1253   BUN 7 04/24/2016 2003   BUN 13.7 04/09/2016 1253   CREATININE 0.93 04/24/2016 2003   CREATININE 1.0 04/09/2016 1253   CREATININE 0.76 01/26/2016 1646      Component Value Date/Time   CALCIUM 9.8 04/24/2016 2003   CALCIUM 9.1 04/09/2016 1253   ALKPHOS 58 04/24/2016 2003   ALKPHOS 56 04/09/2016 1253   AST 14* 04/24/2016 2003   AST 27 04/09/2016 1253   ALT 14 04/24/2016 2003   ALT 28 04/09/2016 1253   BILITOT 1.3* 04/24/2016 2003   BILITOT 1.73* 04/09/2016 1253       RADIOGRAPHIC STUDIES: Dg Chest 2 View  04/24/2016  CLINICAL DATA:  Right flank pain EXAM: CHEST  2 VIEW COMPARISON:  12/05/2013 FINDINGS: Rounded 5.6 cm mass over the right chest which is newly seen, peripheral or  subpleural. There is suggestion of a 1 cm left lung nodule. Appearance concerning for malignancy/metastatic disease. No associated air bronchograms. Normal heart size and mediastinal contours. No acute osseous finding. IMPRESSION: 6 cm mass over the right chest. Possible 1 cm left lung nodule. Recommend chest CT. Electronically Signed   By: 14/05/2013 M.D.   On: 04/24/2016 23:15   Ct Chest W Contrast  04/25/2016  CLINICAL DATA:  Right-sided chest pain. EXAM: CT CHEST WITH CONTRAST TECHNIQUE: Multidetector CT imaging of the chest was performed during intravenous contrast administration. CONTRAST:  42mL ISOVUE-300 IOPAMIDOL (ISOVUE-300) INJECTION 61% COMPARISON:  None. FINDINGS: THORACIC INLET/BODY WALL: No acute abnormality. MEDIASTINUM: Mediastinal adenopathy with 13 mm subcarinal node and rounded esophageal hiatus node measuring 15 mm. Bilateral posterior mediastinal nodules, greater on the right at the level of the destructive rib mass where a discrete nodule measures up to 3 cm. The rib mass is discrete from these posterior mediastinal nodules. Normal heart size. Atherosclerosis, including the coronary arteries. No acute vascular finding LUNG WINDOWS: Mild dependent atelectasis.  No primary mass lesion is seen. 5 mm right upper lobe pulmonary nodule. Trace pleural effusions. UPPER ABDOMEN: No acute findings. OSSEOUS: Large posterior right sixth rib mass with extraosseous subpleural growth accounting for the chest x-ray abnormality. The mass measures up to 52 mm in maximal axial dimension. There are are occasional tiny cortical lucencies which are likely myelomatous. Approximately 1 cm lytic lesion in the lateral margin right scapula. Marked osteopenia IMPRESSION: 1. The right chest mass is a large destructive sixth rib lesion. This and other lytic bone lesions are presumably related to patient's history of myeloma. 2. Posterior mediastinal nodules and mediastinal adenopathy could be myelomatous, lymphoma  or less likely metastatic disease not excluded. 3. 5 mm upper lobe pulmonary nodule, attention on surveillance imaging. Electronically Signed   By: 70m M.D.   On: 04/25/2016 01:22  Ct Renal Stone Study  04/24/2016  CLINICAL DATA:  Acute onset of upper abdominal pain, radiating to the right and about the back. Initial encounter. EXAM: CT ABDOMEN AND PELVIS WITHOUT CONTRAST TECHNIQUE: Multidetector CT imaging of the abdomen and pelvis was performed following the standard protocol without IV contrast. COMPARISON:  CT of the chest, abdomen and pelvis from 06/02/2010, and MRI of the lumbar spine performed 03/01/2014 FINDINGS: Trace right-sided pleural fluid is noted. Minimal bibasilar atelectasis or scarring is seen. The liver and spleen are unremarkable in appearance. The patient is status post cholecystectomy. The pancreas and adrenal glands are unremarkable. Mild nonspecific perinephric stranding is noted bilaterally. There is no evidence of hydronephrosis. No renal or ureteral stones are identified. No free fluid is identified. The small bowel is unremarkable in appearance. The stomach is within normal limits. No acute vascular abnormalities are seen. A small umbilical hernia is seen, containing only fat. The patient is status post partial colectomy, with an ileocolic anastomosis at the proximal transverse colon. The anastomosis is unremarkable in appearance. The remaining colon is unremarkable in appearance. The bladder is decompressed and not well assessed. The patient is status post hysterectomy. No suspicious adnexal masses are seen. No inguinal lymphadenopathy is seen. No acute osseous abnormalities are identified. There is grade 1 anterolisthesis of L4 on L5, with underlying facet disease. There is mild chronic loss of height at the superior endplate of L2. Lateral osteophytes are noted along the lower thoracic spine. IMPRESSION: 1. No acute abnormality seen to explain the patient's symptoms. 2.  Trace right-sided pleural fluid noted. Minimal bibasilar atelectasis or scarring seen. 3. Small umbilical hernia, containing only fat. 4. Ileocolic anastomosis is unremarkable in appearance. 5. Mild degenerative change along the lumbar spine. Electronically Signed   By: Garald Balding M.D.   On: 04/24/2016 23:37    ASSESSMENT AND PLAN: This is a very pleasant 64 years old [place] American female with history of multiple myeloma currently on treatment with Revlimid and low-dose Decadron and tolerated her treatment fairly well. The patient is doing fine today with no specific complaints except for mild fatigue.  Because of disease progression, I recommended for her to continue her treatment with Revlimid and Decadron but I increased the dose of Revlimid to 25 mg by mouth daily for 21 days every 4 weeks, status post 12 months of this regimen. The recent myeloma panel performed at [facility] showed evidence for disease progression. The patient also has enlarging right-sided chest wall lesion. I will arrange for the patient to have repeat skeletal bone survey to rule out any other progressive myeloma lesion. I discussed with the patient her treatment options and recommended for her to discontinue treatment with Revlimid by next week. I discussed with the patient her treatment options with Daratumumab, Velcade and Decadron. She is expected to start the first dose of this treatment on 05/07/2016. I discussed with the patient adverse effect of this treatment. For the painful right-sided chest wall lesion, I referred the patient to radiation oncology for consideration of palliative radiotherapy to this area. The patient will continue her treatment with Zometa every 3 months as a scheduled. She will come back for follow-up visit in 2 weeks for evaluation and management of any adverse effect of her treatment. She was advised to call immediately if she has any concerning symptoms and interval. The patient  voices understanding of current disease status and treatment options and is in agreement with the current care plan.  All questions were  answered. The patient knows to call the clinic with any problems, questions or concerns. We can certainly see the patient much sooner if necessary.  Disclaimer: This note was dictated with voice recognition software. Similar sounding words can inadvertently be transcribed and may not be corrected upon review.       

## 2016-04-27 NOTE — Progress Notes (Signed)
Thoracic Location of Tumor / Histology:  Multiple Myeloma (2006)progression now right sided chest wall lesion    Patient presented  months ago with symptoms of: right sided chest pain went to ED, CT showed chest  mass with a large  Destructive  6th rib lesion   Biopsies of  (if applicable) revealed: none  Tobacco/Marijuana/Snuff/ETOH use: non smoker, no alcohol, no tobacco products, no illicit drugs   Past/Anticipated interventions by cardiothoracic surgery, if any:   Past/Anticipated interventions by medical oncology, if any: Dr. [name] 04/27/16, CURRENT THERAPY:  1) systemic chemotherapy with Daratumumab 16 MG/KG weekly for the first 9 weeks, Velcade 1.3 MG/M2 subcutaneously on days 1, 4, 8 and 11 every 3 weeks in addition to Decadron 20 mg by mouth on days 1, 2, 4, 5, 8, 9, 11 and 12 every 3 weeks. First dose 05/07/2016 2) Zometa 4 mg IV every 3 months. First dose in 05/05/2015, Referral for palliative radiation , Follow up  2 weeks, Chemotherapy 8 hour infusion on 05/07/16 and 5/11./17  1 hour chemotherapy infusion, follow up 05/16/16  Signs/Symptoms  Weight changes, if any: 10 lb wt. Loss past 3 months,   Respiratory complaints, if any: no sob  Hemoptysis, if any: no  Pain issues, if SYH:NPMVA mid section side  SAFETY ISSUES: NO  Prior radiation? NO  Pacemaker/ICD? NO  Possible current pregnancy? NO  Is the patient on methotrexate?  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

## 2016-04-30 ENCOUNTER — Ambulatory Visit
Admission: RE | Admit: 2016-04-30 | Discharge: 2016-04-30 | Disposition: A | Discharge status: Home / Self Care | Payer: Medicare Other | Source: Ambulatory Visit | Attending: Radiation Oncology | Admitting: Radiation Oncology

## 2016-04-30 ENCOUNTER — Ambulatory Visit
Admission: RE | Admit: 2016-04-30 | Discharge: 2016-04-30 | Disposition: A | Discharge status: Home / Self Care | Payer: Medicare Other | Source: Ambulatory Visit | Admitting: Radiation Oncology

## 2016-04-30 ENCOUNTER — Encounter: Payer: Self-pay | Admitting: Radiation Oncology

## 2016-04-30 VITALS — BP 102/54 | HR 102 | Temp 98.1°F | Resp 20 | Ht 66.0 in | Wt 189.3 lb

## 2016-04-30 DIAGNOSIS — C9000 Multiple myeloma not having achieved remission: Secondary | ICD-10-CM | POA: Diagnosis not present

## 2016-04-30 DIAGNOSIS — E1142 Type 2 diabetes mellitus with diabetic polyneuropathy: Secondary | ICD-10-CM | POA: Insufficient documentation

## 2016-04-30 DIAGNOSIS — Z85038 Personal history of other malignant neoplasm of large intestine: Secondary | ICD-10-CM | POA: Insufficient documentation

## 2016-04-30 DIAGNOSIS — Z510 Encounter for antineoplastic radiation therapy: Secondary | ICD-10-CM | POA: Insufficient documentation

## 2016-04-30 DIAGNOSIS — C7951 Secondary malignant neoplasm of bone: Secondary | ICD-10-CM | POA: Insufficient documentation

## 2016-04-30 DIAGNOSIS — H409 Unspecified glaucoma: Secondary | ICD-10-CM | POA: Insufficient documentation

## 2016-04-30 HISTORY — DX: Malignant neoplasm of unspecified part of unspecified bronchus or lung: C34.90

## 2016-04-30 NOTE — Progress Notes (Signed)
Radiation Oncology         [phone] ________________________________  Name: Darlene Howard MRN: [id number]  Date: 04/30/2016  DOB: 03/14/1951  [id number] Darlene Cipro, MD  Darlene Bears, MD     REFERRING PHYSICIAN: Curt Bears, MD   DIAGNOSIS: The encounter diagnosis was Multiple myeloma not having achieved remission ([place]). Multiple myeloma with osseous metastasis to the right sixth rib  HISTORY OF PRESENT ILLNESS::Darlene Howard is a 65 y.o. female who is seen for an initial consultation visit regarding the patient's diagnosis of multiple myeloma (initial diagnosis in 2006).  The patient presented to the ED on 04/24/16 with right sided flank pain. X-ray noted a new 5.6 cm mass over the right chest and a 1 cm left lung nodule. CT of the chest with contrasted noted a 5.2 cm destructive right sixth rib lesion in addition to posterior mediastinal nodules and mediastinal lymphadenopathy secondary to myelomatous changes. The patient is currently being treated systemically by Dr. Julien Howard. The patient is not eligible for a clinical trial at [facility]. A recent myeloma panel performed there and the enlarging right-sided chest wall lesion indicate disease progression. The patient was referred to me today for consideration of palliative radiation to this region.  PREVIOUS RADIATION THERAPY: No  PAST MEDICAL HISTORY:  has a past medical history of Glaucoma; Personal history of colon cancer; Herpes; GERD (gastroesophageal reflux disease); Hypertension; Bruises easily; History of colon polyps; Type 2 diabetes mellitus ([place]); Neuropathy associated with multiple myeloma ([place]); H/O stem cell transplant ([place]); Multiple myeloma (DX  2006  STATE IIA,  IgA  KAPPA); Vocal cord nodule; DDD (degenerative disc disease), lumbar; Cholelithiasis; Fatty liver; and Lung cancer ([place]).     PAST SURGICAL HISTORY: Past Surgical History  Procedure Laterality Date  . Colonoscopy  10/07/2006    ERD:EYCXKG  rectum/Status post right hemicolectomy. Normal residual colon  . Cervical cone biopsy    . Cataract extraction w/phaco Left 01/20/2014    Procedure: CATARACT EXTRACTION PHACO AND INTRAOCULAR LENS PLACEMENT (IOC);  Surgeon: Adonis Brook, MD;  Location: [place];  Service: Ophthalmology;  Laterality: Left;  . 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 Co2  ablation vulva perianal and vaginal dysplatic areas  02-15-2010  . Abdominal hysterectomy  1983    w/  appendectomy and left salpingoophorectom  . Hemicolectomy Right 04-27-2005    CARCINOMA   ASCENDING COLON  . Vulvectomy N/A 05/06/2014    Procedure: WIDE LOCAL EXCISION LEFT LABIA MAJORA;  Surgeon: Janie Morning, MD;  Location[facility];  Service: Gynecology;  Laterality: N/A;  . Vulvar lesion removal N/A 05/06/2014    Procedure: LASER OF VULVAR LESION;  Surgeon: Janie Morning, MD;  Location: [facility];  Service: Gynecology;  Laterality: N/A;  . Lesion removal N/A 05/06/2014    Procedure: LASER OF THE VAGINA;  Surgeon: Janie Morning, MD;  Location: [facility];  Service: Gynecology;  Laterality: N/A;  . Esophagogastroduodenoscopy  04/02/2005    YJE:HUDJSH/FWYOVZCHYIFO polyp with central erosion versus ulceration in the body of the stomach, resected and recovered Remainder of the gastric mucosa, D1, D2 appeared normal  . Colonoscopy  10/2009    RMR: diminutive RS polyp (hyperplastic).   . Colonoscopy N/A 12/15/2014    Procedure: COLONOSCOPY;  Surgeon: Daneil Dolin, MD;  [facility];  Service: Endoscopy;  Laterality: N/A;  130   . Cholecystectomy N/A 02/28/2016    Procedure: LAPAROSCOPIC CHOLECYSTECTOMY;  Surgeon: Ralene Ok,  MD;  Location: [place];  Service: General;  Laterality: N/A;     FAMILY HISTORY: family history includes Hyperlipidemia in her brother and sister; Hypertension in her brother, father, mother, sister, sister, and sister; Kidney failure in her mother;  Prostate cancer (age of onset: 58) in her father; Stroke in her father. There is no history of Colon cancer or Breast cancer.   SOCIAL HISTORY:  reports that she has never smoked. She has never used smokeless tobacco. She reports that she does not drink alcohol or use illicit drugs.   ALLERGIES: Codeine and Tramadol   MEDICATIONS:  Current Outpatient Prescriptions  Medication Sig Dispense Refill  . acetaminophen (TYLENOL) 500 MG tablet Take 500 mg by mouth daily as needed for moderate pain.    Marland Kitchen amLODipine (NORVASC) 2.5 MG tablet Take 1 tablet by mouth  daily in the morning 90 tablet 0  . aspirin (ASPIRIN LOW DOSE) 81 MG EC tablet Take 81 mg by mouth daily.      . calcium-vitamin D (OSCAL 500/200 D-3) 500-200 MG-UNIT per tablet Take 1 tablet by mouth 3 (three) times daily.      . cholecalciferol (VITAMIN D) 1000 UNITS tablet Take 1,000 Units by mouth daily. Reported on 04/24/2016    . dorzolamide-timolol (COSOPT) 22.3-6.8 MG/ML ophthalmic solution Place 1 drop into the left eye 2 (two) times daily.     Marland Kitchen gabapentin (NEURONTIN) 100 MG capsule Take 1 capsule by mouth 3  times daily (Patient taking differently: Take 300 mg by mouth at bedtime) 270 capsule 0  . loperamide (IMODIUM A-D) 2 MG tablet Take 2 mg by mouth daily as needed for diarrhea or loose stools.    Marland Kitchen loratadine (CLARITIN) 10 MG tablet TAKE ONE TABLET BY MOUTH ONCE DAILY. (Patient taking differently: TAKE 10 MG BY MOUTH ONCE DAILY.) 30 tablet 3  . LUMIGAN 0.01 % SOLN Place 1 drop into both eyes at bedtime.     . metFORMIN (GLUCOPHAGE) 500 MG tablet Take 1 tablet (500 mg total) by mouth daily with breakfast. 90 tablet 3  . Multiple Vitamins-Minerals (CENTRUM SILVER PO) Take 1 tablet by mouth daily.     . ONE TOUCH ULTRA TEST test strip USE AS DIRECTED TO CHECK DAILY. 50 each 3  . oxyCODONE (ROXICODONE) 5 MG immediate release tablet Take 1 tablet (5 mg total) by mouth every 4 (four) hours as needed for severe pain. 30 tablet 0  .  potassium chloride SA (K-DUR,KLOR-CON) 20 MEQ tablet Take 1 tablet by mouth  twice a day (Patient taking differently: Take 20 meq by mouth twice daily) 180 tablet 0  . PREMARIN vaginal cream PLACE 1 APPLICATORFUL VAGINALLY TWICE A WEEK. 30 g 0  . REVLIMID 25 MG capsule Take 25 mg by mouth daily. At night    . valACYclovir (VALTREX) 1000 MG tablet Take 1 tablet by mouth  daily as needed (Patient taking differently: Take 1 tablet by mouth  daily as needed for outbreaks) 90 tablet 1  . valsartan-hydrochlorothiazide (DIOVAN-HCT) 320-25 MG tablet Take 1 tablet by mouth  daily 90 tablet 0  . warfarin (COUMADIN) 2 MG tablet Take 1 tablet by mouth  daily (Patient taking differently: Take 2 mg by mouth daily) 90 tablet 0  . dexamethasone (DECADRON) 4 MG tablet 5 tablets by mouth daily on days 1, 2, 4, 5, 8, 9, 11 and 12 every 3 weeks starting with the first dose of chemotherapy on 05/07/2016 (Patient not taking: Reported on 04/30/2016) 10 tablet 1  .  ondansetron (ZOFRAN ODT) 4 MG disintegrating tablet Take 1 tablet (4 mg total) by mouth every 8 (eight) hours as needed for nausea or vomiting. (Patient not taking: Reported on 04/09/2016) 20 tablet 0  . ondansetron (ZOFRAN) 4 MG tablet Take 1 tablet (4 mg total) by mouth every 6 (six) hours. (Patient not taking: Reported on 04/27/2016) 10 tablet 0  . oxyCODONE-acetaminophen (PERCOCET/ROXICET) 5-325 MG tablet Take 1 tablet by mouth every 8 (eight) hours as needed for severe pain. (Patient not taking: Reported on 04/30/2016) 40 tablet 0  . pantoprazole (PROTONIX) 40 MG tablet TAKE 1 TABLET BY MOUTH  DAILY FOR ACID REFLUX (Patient taking differently: TAKE 40 MG BY MOUTH  DAILY FOR ACID REFLUX) 90 tablet 0   No current facility-administered medications for this encounter.     REVIEW OF SYSTEMS:  A 15 point review of systems is documented in the electronic medical record. This was obtained by the nursing staff. However, I reviewed this with the patient to discuss relevant  findings and make appropriate changes.  The patient has pain in her right mid-section.  PHYSICAL EXAM:  height is _0  (1.676 m) and weight is 189 lb 4.8 oz (85.866 kg). Her oral temperature is 98.1 F (36.7 C). Her blood pressure is 102/54 and her pulse is 102. Her respiration is 20.    Lungs are clear to auscultation bilaterally. Heart has regular rate and rhythm. No palpable cervical, supraclavicular, or axillary adenopathy. Abdomen soft, non-tender, normal bowel sounds.  ECOG = 1  0 - Asymptomatic (Fully active, able to carry on all predisease activities without restriction)  1 - Symptomatic but completely ambulatory (Restricted in physically strenuous activity but ambulatory and able to carry out work of a light or sedentary nature. For example, light housework, office work)  2 - Symptomatic, <50% in bed during the day (Ambulatory and capable of all self care but unable to carry out any work activities. Up and about more than 50% of waking hours)  3 - Symptomatic, >50% in bed, but not bedbound (Capable of only limited self-care, confined to bed or chair 50% or more of waking hours)  4 - Bedbound (Completely disabled. Cannot carry on any self-care. Totally confined to bed or chair)  5 - Death   Eustace Pen MM, Creech RH, Tormey DC, et al. [phone]). "Toxicity and response criteria of the [facility]". [place] Oncol. 5 (6): 649-55   LABORATORY DATA:  Lab Results  Component Value Date   WBC 7.0 04/24/2016   HGB 8.8* 04/24/2016   HCT 28.7* 04/24/2016   MCV 96.0 04/24/2016   PLT 163 04/24/2016   Lab Results  Component Value Date   NA 138 04/24/2016   K 3.6 04/24/2016   CL 101 04/24/2016   CO2 26 04/24/2016   Lab Results  Component Value Date   ALT 14 04/24/2016   AST 14* 04/24/2016   ALKPHOS 58 04/24/2016   BILITOT 1.3* 04/24/2016      RADIOGRAPHY: Dg Chest 2 View  04/24/2016  CLINICAL DATA:  Right flank pain EXAM: CHEST  2 VIEW COMPARISON:   12/05/2013 FINDINGS: Rounded 5.6 cm mass over the right chest which is newly seen, peripheral or subpleural. There is suggestion of a 1 cm left lung nodule. Appearance concerning for malignancy/metastatic disease. No associated air bronchograms. Normal heart size and mediastinal contours. No acute osseous finding. IMPRESSION: 6 cm mass over the right chest. Possible 1 cm left lung nodule. Recommend chest CT. Electronically Signed  By: Monte Fantasia M.D.   On: 04/24/2016 23:15   Ct Chest W Contrast  04/25/2016  CLINICAL DATA:  Right-sided chest pain. EXAM: CT CHEST WITH CONTRAST TECHNIQUE: Multidetector CT imaging of the chest was performed during intravenous contrast administration. CONTRAST:  54m ISOVUE-300 IOPAMIDOL (ISOVUE-300) INJECTION 61% COMPARISON:  None. FINDINGS: THORACIC INLET/BODY WALL: No acute abnormality. MEDIASTINUM: Mediastinal adenopathy with 13 mm subcarinal node and rounded esophageal hiatus node measuring 15 mm. Bilateral posterior mediastinal nodules, greater on the right at the level of the destructive rib mass where a discrete nodule measures up to 3 cm. The rib mass is discrete from these posterior mediastinal nodules. Normal heart size. Atherosclerosis, including the coronary arteries. No acute vascular finding LUNG WINDOWS: Mild dependent atelectasis.  No primary mass lesion is seen. 5 mm right upper lobe pulmonary nodule. Trace pleural effusions. UPPER ABDOMEN: No acute findings. OSSEOUS: Large posterior right sixth rib mass with extraosseous subpleural growth accounting for the chest x-ray abnormality. The mass measures up to 52 mm in maximal axial dimension. There are are occasional tiny cortical lucencies which are likely myelomatous. Approximately 1 cm lytic lesion in the lateral margin right scapula. Marked osteopenia IMPRESSION: 1. The right chest mass is a large destructive sixth rib lesion. This and other lytic bone lesions are presumably related to patient's history of  myeloma. 2. Posterior mediastinal nodules and mediastinal adenopathy could be myelomatous, lymphoma or less likely metastatic disease not excluded. 3. 5 mm upper lobe pulmonary nodule, attention on surveillance imaging. Electronically Signed   By: JMonte FantasiaM.D.   On: 04/25/2016 01:22   Dg Bone Survey Met  05/01/2016  CLINICAL DATA:  Multiple myeloma. EXAM: METASTATIC BONE SURVEY COMPARISON:  04/24/2016 .  03/28/2012. FINDINGS: 6 cm mass lesion noted projected right upper chest with destruction of the right posterior sixth rib. Progressive nodular density noted projected over the left mid chest. Lytic lesions are noted in the skull. Lytic lesion in the right scapula noted. Lytic lesion in the distal right humerus noted. Lytic lesion in the right radius. Lytic lesion noted mid left radius. Mild new compression L2. Stable anterolisthesis L4 on L5. Diffuse osteopenia is present. Peripheral vascular calcification present. IMPRESSION: 1. 6 cm expansile mass involving the right posterior seventh rib. 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 Metastatic disease cannot be excluded . Progression of ill-defined pulmonary versus rib lesion noted over the left mid chest. 2. Widespread bony lucencies consistent with myeloma, particularly prominent within the skull. 3. Mild compression L2. Electronically Signed   By: TMarcello Moores Register   On: 05/01/2016 12:12   Ct Renal Stone Study  04/24/2016  CLINICAL DATA:  Acute onset of upper abdominal pain, radiating to the right and about the back. Initial encounter. EXAM: CT ABDOMEN AND PELVIS WITHOUT CONTRAST TECHNIQUE: Multidetector CT imaging of the abdomen and pelvis was performed following the standard protocol without IV contrast. COMPARISON:  CT of the chest, abdomen and pelvis from 06/02/2010, and MRI of the lumbar spine performed 03/01/2014 FINDINGS: Trace right-sided pleural fluid is noted. Minimal bibasilar atelectasis or scarring is seen.  The liver and spleen are unremarkable in appearance. The patient is status post cholecystectomy. The pancreas and adrenal glands are unremarkable. Mild nonspecific perinephric stranding is noted bilaterally. There is no evidence of hydronephrosis. No renal or ureteral stones are identified. No free fluid is identified. The small bowel is unremarkable in appearance. The stomach is within normal limits. No acute vascular abnormalities are seen.  A small umbilical hernia is seen, containing only fat. The patient is status post partial colectomy, with an ileocolic anastomosis at the proximal transverse colon. The anastomosis is unremarkable in appearance. The remaining colon is unremarkable in appearance. The bladder is decompressed and not well assessed. The patient is status post hysterectomy. No suspicious adnexal masses are seen. No inguinal lymphadenopathy is seen. No acute osseous abnormalities are identified. There is grade 1 anterolisthesis of L4 on L5, with underlying facet disease. There is mild chronic loss of height at the superior endplate of L2. Lateral osteophytes are noted along the lower thoracic spine. IMPRESSION: 1. No acute abnormality seen to explain the patient's symptoms. 2. Trace right-sided pleural fluid noted. Minimal bibasilar atelectasis or scarring seen. 3. Small umbilical hernia, containing only fat. 4. Ileocolic anastomosis is unremarkable in appearance. 5. Mild degenerative change along the lumbar spine. Electronically Signed   By: Garald Balding M.D.   On: 04/24/2016 23:37      IMPRESSION: Multiple myeloma with osseous metastasis to the right sixth rib  The patient is a good candidate for 10 fractions of palliative radiation to the right rib lesion. I discussed the role of radiation in this regard and potential side effects that may arise; such as skin irritation and nausea. I discussed the process of CT simulation and the placement of tattoos.  PLAN:  The patient agrees with this  course of treatment. The patient signed a consent form and this was placed in her medical chart. CT simulation will be scheduled soon as to begin treatment.  In the interim, Dr. Julien Howard has ordered a bone scan scheduled for tomorrow in order to rule out any other areas of disease progression. He has changed her systemic treatment options and she will begin her first dose of the new treatment next week. ________________________________   Jodelle Gross, MD, PhD  This document serves as a record of services personally performed by Kyung Rudd, MD. It was created on his behalf by Darcus Austin, a trained medical scribe. The creation of this record is based on the scribe's personal observations and the provider's statements to them. This document has been checked and approved by the attending provider.

## 2016-04-30 NOTE — Progress Notes (Signed)
Please see the Nurse Progress Note in the MD Initial Consult Encounter for this patient. 

## 2016-05-01 ENCOUNTER — Ambulatory Visit (HOSPITAL_COMMUNITY)
Admission: RE | Admit: 2016-05-01 | Discharge: 2016-05-01 | Disposition: A | Discharge status: Home / Self Care | Payer: Medicare Other | Source: Ambulatory Visit | Attending: Internal Medicine | Admitting: Internal Medicine

## 2016-05-01 DIAGNOSIS — M5106 Intervertebral disc disorders with myelopathy, lumbar region: Secondary | ICD-10-CM | POA: Insufficient documentation

## 2016-05-01 DIAGNOSIS — C9000 Multiple myeloma not having achieved remission: Secondary | ICD-10-CM

## 2016-05-01 DIAGNOSIS — R918 Other nonspecific abnormal finding of lung field: Secondary | ICD-10-CM | POA: Diagnosis not present

## 2016-05-04 ENCOUNTER — Ambulatory Visit
Admission: RE | Admit: 2016-05-04 | Discharge: 2016-05-04 | Disposition: A | Discharge status: Home / Self Care | Payer: Medicare Other | Source: Ambulatory Visit | Attending: Radiation Oncology | Admitting: Radiation Oncology

## 2016-05-04 DIAGNOSIS — E1142 Type 2 diabetes mellitus with diabetic polyneuropathy: Secondary | ICD-10-CM | POA: Diagnosis not present

## 2016-05-04 DIAGNOSIS — Z510 Encounter for antineoplastic radiation therapy: Secondary | ICD-10-CM | POA: Diagnosis not present

## 2016-05-04 DIAGNOSIS — Z85038 Personal history of other malignant neoplasm of large intestine: Secondary | ICD-10-CM | POA: Diagnosis not present

## 2016-05-04 DIAGNOSIS — C7951 Secondary malignant neoplasm of bone: Secondary | ICD-10-CM | POA: Diagnosis not present

## 2016-05-04 DIAGNOSIS — C9000 Multiple myeloma not having achieved remission: Secondary | ICD-10-CM | POA: Diagnosis not present

## 2016-05-04 DIAGNOSIS — H409 Unspecified glaucoma: Secondary | ICD-10-CM | POA: Diagnosis not present

## 2016-05-07 ENCOUNTER — Other Ambulatory Visit (HOSPITAL_BASED_OUTPATIENT_CLINIC_OR_DEPARTMENT_OTHER): Payer: Medicare Other

## 2016-05-07 ENCOUNTER — Ambulatory Visit (HOSPITAL_BASED_OUTPATIENT_CLINIC_OR_DEPARTMENT_OTHER): Payer: Medicare Other

## 2016-05-07 ENCOUNTER — Telehealth: Payer: Self-pay | Admitting: *Deleted

## 2016-05-07 VITALS — BP 120/56 | HR 75 | Temp 98.4°F | Resp 18

## 2016-05-07 DIAGNOSIS — Z5112 Encounter for antineoplastic immunotherapy: Secondary | ICD-10-CM | POA: Diagnosis not present

## 2016-05-07 DIAGNOSIS — C9000 Multiple myeloma not having achieved remission: Secondary | ICD-10-CM | POA: Diagnosis not present

## 2016-05-07 DIAGNOSIS — R197 Diarrhea, unspecified: Secondary | ICD-10-CM

## 2016-05-07 LAB — COMPREHENSIVE METABOLIC PANEL
ALBUMIN: 2.9 g/dL — AB (ref 3.5–5.0)
ALK PHOS: 66 U/L (ref 40–150)
ALT: 38 U/L (ref 0–55)
ANION GAP: 11 meq/L (ref 3–11)
AST: 26 U/L (ref 5–34)
BILIRUBIN TOTAL: 1.5 mg/dL — AB (ref 0.20–1.20)
BUN: 8.9 mg/dL (ref 7.0–26.0)
CALCIUM: 8.7 mg/dL (ref 8.4–10.4)
CO2: 25 mEq/L (ref 22–29)
Chloride: 106 mEq/L (ref 98–109)
Creatinine: 0.9 mg/dL (ref 0.6–1.1)
EGFR: 76 mL/min/{1.73_m2} — AB (ref >90)
Glucose: 119 mg/dl (ref 70–140)
Potassium: 2.9 mEq/L — CL (ref 3.5–5.1)
Sodium: 142 mEq/L (ref 136–145)
TOTAL PROTEIN: 7.3 g/dL (ref 6.4–8.3)

## 2016-05-07 LAB — CBC WITH DIFFERENTIAL/PLATELET
BASO%: 0.6 % (ref 0.0–2.0)
BASOS ABS: 0 10*3/uL (ref 0.0–0.1)
EOS ABS: 0.3 10*3/uL (ref 0.0–0.5)
EOS%: 3.5 % (ref 0.0–7.0)
HEMATOCRIT: 26 % — AB (ref 34.8–46.6)
HGB: 8.1 g/dL — ABNORMAL LOW (ref 11.6–15.9)
LYMPH%: 11.6 % — ABNORMAL LOW (ref 14.0–49.7)
MCH: 29.8 pg (ref 25.1–34.0)
MCHC: 31.2 g/dL — ABNORMAL LOW (ref 31.5–36.0)
MCV: 95.6 fL (ref 79.5–101.0)
MONO#: 0.9 10*3/uL (ref 0.1–0.9)
MONO%: 11.8 % (ref 0.0–14.0)
NEUT#: 5.2 10*3/uL (ref 1.5–6.5)
NEUT%: 72.5 % (ref 38.4–76.8)
NRBC: 4 % — AB (ref 0–0)
PLATELETS: 83 10*3/uL — AB (ref 145–400)
RBC: 2.72 10*6/uL — AB (ref 3.70–5.45)
RDW: 17.8 % — AB (ref 11.2–14.5)
WBC: 7.2 10*3/uL (ref 3.9–10.3)
lymph#: 0.8 10*3/uL — ABNORMAL LOW (ref 0.9–3.3)

## 2016-05-07 LAB — LACTATE DEHYDROGENASE: LDH: 191 U/L (ref 125–245)

## 2016-05-07 MED ORDER — METHYLPREDNISOLONE SODIUM SUCC 125 MG IJ SOLR
INTRAMUSCULAR | Status: AC
  Filled 2016-05-07: qty 2

## 2016-05-07 MED ORDER — ACETAMINOPHEN 325 MG PO TABS
650.0000 mg | ORAL_TABLET | Freq: Once | ORAL | Status: AC
  Administered 2016-05-07: 650 mg via ORAL

## 2016-05-07 MED ORDER — MONTELUKAST SODIUM 10 MG PO TABS
10.0000 mg | ORAL_TABLET | Freq: Once | ORAL | Status: AC
  Administered 2016-05-07: 10 mg via ORAL
  Filled 2016-05-07: qty 1

## 2016-05-07 MED ORDER — DIPHENHYDRAMINE HCL 25 MG PO CAPS
ORAL_CAPSULE | ORAL | Status: AC
  Filled 2016-05-07: qty 2

## 2016-05-07 MED ORDER — PROCHLORPERAZINE MALEATE 10 MG PO TABS
ORAL_TABLET | ORAL | Status: AC
  Filled 2016-05-07: qty 1

## 2016-05-07 MED ORDER — LOPERAMIDE HCL 2 MG PO CAPS
2.0000 mg | ORAL_CAPSULE | ORAL | Status: DC | PRN
  Administered 2016-05-07: 2 mg via ORAL
  Filled 2016-05-07: qty 1

## 2016-05-07 MED ORDER — ACETAMINOPHEN 325 MG PO TABS
ORAL_TABLET | ORAL | Status: AC
  Filled 2016-05-07: qty 2

## 2016-05-07 MED ORDER — PROCHLORPERAZINE MALEATE 10 MG PO TABS
10.0000 mg | ORAL_TABLET | Freq: Once | ORAL | Status: AC
  Administered 2016-05-07: 10 mg via ORAL

## 2016-05-07 MED ORDER — BORTEZOMIB CHEMO SQ INJECTION 3.5 MG (2.5MG/ML)
1.3000 mg/m2 | Freq: Once | INTRAMUSCULAR | Status: AC
  Administered 2016-05-07: 2.5 mg via SUBCUTANEOUS
  Filled 2016-05-07: qty 2.5

## 2016-05-07 MED ORDER — METHYLPREDNISOLONE SODIUM SUCC 125 MG IJ SOLR
125.0000 mg | Freq: Once | INTRAMUSCULAR | Status: AC
  Administered 2016-05-07: 125 mg via INTRAVENOUS

## 2016-05-07 MED ORDER — DIPHENHYDRAMINE HCL 25 MG PO CAPS
50.0000 mg | ORAL_CAPSULE | Freq: Once | ORAL | Status: AC
  Administered 2016-05-07: 50 mg via ORAL

## 2016-05-07 MED ORDER — SODIUM CHLORIDE 0.9 % IV SOLN
Freq: Once | INTRAVENOUS | Status: AC
  Administered 2016-05-07: 09:00:00 via INTRAVENOUS

## 2016-05-07 MED ORDER — SODIUM CHLORIDE 0.9 % IV SOLN
16.7000 mg/kg | Freq: Once | INTRAVENOUS | Status: AC
  Administered 2016-05-07: 1400 mg via INTRAVENOUS
  Filled 2016-05-07: qty 70

## 2016-05-07 NOTE — Progress Notes (Signed)
Patient having several loose BM's today prior to infusion - states this is normal with her diabetes medications but did request something to help with that today. MD [name] ordered med - see MAR.  Pt also instructed by Mechele Claude per MD [facility] to increase po Potassium to 3 tablets each day for 7 days instead of the normal 2 a day she is taking - she agreed.

## 2016-05-07 NOTE — Telephone Encounter (Signed)
Per MD-"Ok to treat with platelets 83".  CBC and CMEt reviewed with MD. Pt K+ 2.9, pt reports taking 1 ( 12mq tablet) 2x day. Instructed pt while in the infusion room to increase K+ to 3 tablets daily x 7 days. Pt advised she has plenty of potassium at home does not need refill at this time.

## 2016-05-07 NOTE — Patient Instructions (Addendum)
TAKE 3 POTASSIUM TABLETS FOR 7 DAYS - 3 TOTAL TABLETS IN A DAY PER DR MOHAMED.   [facility] Discharge Instructions for Patients Receiving Chemotherapy  Today you received the following chemotherapy agents Darzalex and Velcade. To help prevent nausea and vomiting after your treatment, we encourage you to take your nausea medication as directed.  If you develop nausea and vomiting that is not controlled by your nausea medication, call the clinic.   BELOW ARE SYMPTOMS THAT SHOULD BE REPORTED IMMEDIATELY:  *FEVER GREATER THAN 100.5 F  *CHILLS WITH OR WITHOUT FEVER  NAUSEA AND VOMITING THAT IS NOT CONTROLLED WITH YOUR NAUSEA MEDICATION  *UNUSUAL SHORTNESS OF BREATH  *UNUSUAL BRUISING OR BLEEDING  TENDERNESS IN MOUTH AND THROAT WITH OR WITHOUT PRESENCE OF ULCERS  *URINARY PROBLEMS  *BOWEL PROBLEMS  UNUSUAL RASH Items with * indicate a potential emergency and should be followed up as soon as possible.  Feel free to call the clinic you have any questions or concerns. The clinic phone number is [phone].  Please show the [facility] at check-in to the Emergency Department and triage nurse.   Daratumumab injection What is this medicine? DARATUMUMAB (dar a toom ue mab) is a monoclonal antibody. It is used to treat multiple myeloma. This medicine may be used for other purposes; ask your health care provider or pharmacist if you have questions. What should I tell my health care provider before I take this medicine? They need to know if you have any of these conditions: -infection (especially a virus infection such as chickenpox, cold sores, or herpes) -lung or breathing disease -pregnant or trying to get pregnant -breast-feeding -an unusual or allergic reaction to daratumumab, other medicines, foods, dyes, or preservatives How should I use this medicine? This medicine is for infusion into a vein. It is given by a health care professional in a hospital or  clinic setting. Talk to your pediatrician regarding the use of this medicine in children. Special care may be needed. Overdosage: If you think you have taken too much of this medicine contact a poison control center or emergency room at once. NOTE: This medicine is only for you. Do not share this medicine with others. What if I miss a dose? Keep appointments for follow-up doses as directed. It is important not to miss your dose. Call your doctor or health care professional if you are unable to keep an appointment. What may interact with this medicine? Interactions have not been studied. Give your health care provider a list of all the medicines, herbs, non-prescription drugs, or dietary supplements you use. Also tell them if you smoke, drink alcohol, or use illegal drugs. Some items may interact with your medicine. This list may not describe all possible interactions. Give your health care provider a list of all the medicines, herbs, non-prescription drugs, or dietary supplements you use. Also tell them if you smoke, drink alcohol, or use illegal drugs. Some items may interact with your medicine. What should I watch for while using this medicine? This drug may make you feel generally unwell. Report any side effects. Continue your course of treatment even though you feel ill unless your doctor tells you to stop. This medicine can cause serious allergic reactions. To reduce your risk you may need to take medicine before treatment with this medicine. Take your medicine as directed. This medicine can affect the results of blood tests to match your blood type. These changes can last for up to 6 months after  the final dose. Your healthcare provider will do blood tests to match your blood type before you start treatment. Tell all of your healthcare providers that you are being treated with this medicine before receiving a blood transfusion. This medicine can affect the results of some tests used to determine  treatment response; extra tests may be needed to evaluate response. Do not become pregnant while taking this medicine or for 3 months after stopping it. Women should inform their doctor if they wish to become pregnant or think they might be pregnant. There is a potential for serious side effects to an unborn child. Talk to your health care professional or pharmacist for more information. What side effects may I notice from receiving this medicine? Side effects that you should report to your doctor or health care professional as soon as possible: -allergic reactions like skin rash, itching or hives, swelling of the face, lips, or tongue -breathing problems -chills -cough -dizziness -feeling faint or lightheaded -headache -nausea, vomiting -shortness of breath Side effects that usually do not require medical attention (Report these to your doctor or health care professional if they continue or are bothersome.): -back pain -fever -joint pain -loss of appetite -tiredness This list may not describe all possible side effects. Call your doctor for medical advice about side effects. You may report side effects to FDA at 1-800-FDA-1088. Where should I keep my medicine? Keep out of the reach of children. This drug is given in a hospital or clinic and will not be stored at home. NOTE: This sheet is a summary. It may not cover all possible information. If you have questions about this medicine, talk to your doctor, pharmacist, or health care provider.    2016, Elsevier/Gold Standard. (2015-02-15 17:02:23)   Bortezomib injection What is this medicine? BORTEZOMIB (bor TEZ oh mib) is a medicine that targets proteins in cancer cells and stops the cancer cells from growing. It is used to treat multiple myeloma and mantle-cell lymphoma. This medicine may be used for other purposes; ask your health care provider or pharmacist if you have questions. What should I tell my health care provider before I  take this medicine? They need to know if you have any of these conditions: -diabetes -heart disease -irregular heartbeat -liver disease -on hemodialysis -low blood counts, like low white blood cells, platelets, or hemoglobin -peripheral neuropathy -taking medicine for blood pressure -an unusual or allergic reaction to bortezomib, mannitol, boron, other medicines, foods, dyes, or preservatives -pregnant or trying to get pregnant -breast-feeding How should I use this medicine? This medicine is for injection into a vein or for injection under the skin. It is given by a health care professional in a hospital or clinic setting. Talk to your pediatrician regarding the use of this medicine in children. Special care may be needed. Overdosage: If you think you have taken too much of this medicine contact a poison control center or emergency room at once. NOTE: This medicine is only for you. Do not share this medicine with others. What if I miss a dose? It is important not to miss your dose. Call your doctor or health care professional if you are unable to keep an appointment. What may interact with this medicine? This medicine may interact with the following medications: -ketoconazole -rifampin -ritonavir -St. John's Wort This list may not describe all possible interactions. Give your health care provider a list of all the medicines, herbs, non-prescription drugs, or dietary supplements you use. Also tell them if you smoke,  drink alcohol, or use illegal drugs. Some items may interact with your medicine. What should I watch for while using this medicine? Visit your doctor for checks on your progress. This drug may make you feel generally unwell. This is not uncommon, as chemotherapy can affect healthy cells as well as cancer cells. Report any side effects. Continue your course of treatment even though you feel ill unless your doctor tells you to stop. You may get drowsy or dizzy. Do not drive, use  machinery, or do anything that needs mental alertness until you know how this medicine affects you. Do not stand or sit up quickly, especially if you are an older patient. This reduces the risk of dizzy or fainting spells. In some cases, you may be given additional medicines to help with side effects. Follow all directions for their use. Call your doctor or health care professional for advice if you get a fever, chills or sore throat, or other symptoms of a cold or flu. Do not treat yourself. This drug decreases your body's ability to fight infections. Try to avoid being around people who are sick. This medicine may increase your risk to bruise or bleed. Call your doctor or health care professional if you notice any unusual bleeding. You may need blood work done while you are taking this medicine. In some patients, this medicine may cause a serious brain infection that may cause death. If you have any problems seeing, thinking, speaking, walking, or standing, tell your doctor right away. If you cannot reach your doctor, urgently seek other source of medical care. Do not become pregnant while taking this medicine. Women should inform their doctor if they wish to become pregnant or think they might be pregnant. There is a potential for serious side effects to an unborn child. Talk to your health care professional or pharmacist for more information. Do not breast-feed an infant while taking this medicine. Check with your doctor or health care professional if you get an attack of severe diarrhea, nausea and vomiting, or if you sweat a lot. The loss of too much body fluid can make it dangerous for you to take this medicine. What side effects may I notice from receiving this medicine? Side effects that you should report to your doctor or health care professional as soon as possible: -allergic reactions like skin rash, itching or hives, swelling of the face, lips, or tongue -breathing problems -changes in  hearing -changes in vision -fast, irregular heartbeat -feeling faint or lightheaded, falls -pain, tingling, numbness in the hands or feet -right upper belly pain -seizures -swelling of the ankles, feet, hands -unusual bleeding or bruising -unusually weak or tired -vomiting -yellowing of the eyes or skin Side effects that usually do not require medical attention (report to your doctor or health care professional if they continue or are bothersome): -changes in emotions or moods -constipation -diarrhea -loss of appetite -headache -irritation at site where injected -nausea This list may not describe all possible side effects. Call your doctor for medical advice about side effects. You may report side effects to FDA at 1-800-FDA-1088. Where should I keep my medicine? This drug is given in a hospital or clinic and will not be stored at home. NOTE: This sheet is a summary. It may not cover all possible information. If you have questions about this medicine, talk to your doctor, pharmacist, or health care provider.    2016, Elsevier/Gold Standard. (2015-02-15 14:47:04)  Potassium Content of Foods Potassium is a mineral found in  many foods and drinks. It helps keep fluids and minerals balanced in your body and affects how steadily your heart beats. Potassium also helps control your blood pressure and keep your muscles and nervous system healthy. Certain health conditions and medicines may change the balance of potassium in your body. When this happens, you can help balance your level of potassium through the foods that you do or do not eat. Your health care provider or dietitian may recommend an amount of potassium that you should have each day. The following lists of foods provide the amount of potassium (in parentheses) per serving in each item. HIGH IN POTASSIUM  The following foods and beverages have 200 mg or more of potassium per serving:  Apricots, 2 raw or 5 dry (200  mg).  Artichoke, 1 medium (345 mg).  Avocado, raw,  each (245 mg).  Banana, 1 medium (425 mg).  Beans, lima, or baked beans, canned,  cup (280 mg).  Beans, white, canned,  cup (595 mg).  Beef roast, 3 oz (320 mg).  Beef, ground, 3 oz (270 mg).  Beets, raw or cooked,  cup (260 mg).  Bran muffin, 2 oz (300 mg).  Broccoli,  cup (230 mg).  Brussels sprouts,  cup (250 mg).  Cantaloupe,  cup (215 mg).  Cereal, 100% bran,  cup (200-400 mg).  Cheeseburger, single, fast food, 1 each (225-400 mg).  Chicken, 3 oz (220 mg).  Clams, canned, 3 oz (535 mg).  Crab, 3 oz (225 mg).  Dates, 5 each (270 mg).  Dried beans and peas,  cup (300-475 mg).  Figs, dried, 2 each (260 mg).  Fish: halibut, tuna, cod, snapper, 3 oz (480 mg).  Fish: salmon, haddock, swordfish, perch, 3 oz (300 mg).  Fish, tuna, canned 3 oz (200 mg).  [place] fries, fast food, 3 oz (470 mg).  Granola with fruit and nuts,  cup (200 mg).  Grapefruit juice,  cup (200 mg).  Greens, beet,  cup (655 mg).  Honeydew melon,  cup (200 mg).  Kale, raw, 1 cup (300 mg).  Kiwi, 1 medium (240 mg).  Kohlrabi, rutabaga, parsnips,  cup (280 mg).  Lentils,  cup (365 mg).  Mango, 1 each (325 mg).  Milk, chocolate, 1 cup (420 mg).  Milk: nonfat, low-fat, whole, buttermilk, 1 cup (350-380 mg).  Molasses, 1 Tbsp (295 mg).  Mushrooms,  cup (280) mg.  Nectarine, 1 each (275 mg).  Nuts: almonds, peanuts, hazelnuts, [place], cashew, mixed, 1 oz (200 mg).  Nuts, pistachios, 1 oz (295 mg).  Orange, 1 each (240 mg).  Orange juice,  cup (235 mg).  Papaya, medium,  fruit (390 mg).  Peanut butter, chunky, 2 Tbsp (240 mg).  Peanut butter, smooth, 2 Tbsp (210 mg).  Pear, 1 medium (200 mg).  Pomegranate, 1 whole (400 mg).  Pomegranate juice,  cup (215 mg).  Pork, 3 oz (350 mg).  Potato chips, salted, 1 oz (465 mg).  Potato, baked with skin, 1 medium (925 mg).  Potatoes, boiled,   cup (255 mg).  Potatoes, mashed,  cup (330 mg).  Prune juice,  cup (370 mg).  Prunes, 5 each (305 mg).  Pudding, chocolate,  cup (230 mg).  Pumpkin, canned,  cup (250 mg).  Raisins, seedless,  cup (270 mg).  Seeds, sunflower or pumpkin, 1 oz (240 mg).  Soy milk, 1 cup (300 mg).  Spinach,  cup (420 mg).  Spinach, canned,  cup (370 mg).  Sweet potato, baked with skin, 1 medium (450 mg).  Swiss chard,  cup (480 mg).  Tomato or vegetable juice,  cup (275 mg).  Tomato sauce or puree,  cup (400-550 mg).  Tomato, raw, 1 medium (290 mg).  Tomatoes, canned,  cup (200-300 mg).  [place], 3 oz (250 mg).  Wheat germ, 1 oz (250 mg).  Winter squash,  cup (250 mg).  Yogurt, plain or fruited, 6 oz (260-435 mg).  Zucchini,  cup (220 mg). MODERATE IN POTASSIUM The following foods and beverages have 50-200 mg of potassium per serving:  Apple, 1 each (150 mg).  Apple juice,  cup (150 mg).  Applesauce,  cup (90 mg).  Apricot nectar,  cup (140 mg).  Asparagus, small spears,  cup or 6 spears (155 mg).  Bagel, cinnamon raisin, 1 each (130 mg).  Bagel, egg or plain, 4 in., 1 each (70 mg).  Beans, green,  cup (90 mg).  Beans, yellow,  cup (190 mg).  Beer, regular, 12 oz (100 mg).  Beets, canned,  cup (125 mg).  Blackberries,  cup (115 mg).  Blueberries,  cup (60 mg).  Bread, whole wheat, 1 slice (70 mg).  Broccoli, raw,  cup (145 mg).  Cabbage,  cup (150 mg).  Carrots, cooked or raw,  cup (180 mg).  Cauliflower, raw,  cup (150 mg).  Celery, raw,  cup (155 mg).  Cereal, bran flakes, cup (120-150 mg).  Cheese, cottage,  cup (110 mg).  Cherries, 10 each (150 mg).  Chocolate, 1 oz bar (165 mg).  Coffee, brewed 6 oz (90 mg).  Corn,  cup or 1 ear (195 mg).  Cucumbers,  cup (80 mg).  Egg, large, 1 each (60 mg).  Eggplant,  cup (60 mg).  Endive, raw, cup (80 mg).  English muffin, 1 each (65 mg).  Fish, orange  roughy, 3 oz (150 mg).  Frankfurter, beef or pork, 1 each (75 mg).  Fruit cocktail,  cup (115 mg).  Grape juice,  cup (170 mg).  Grapefruit,  fruit (175 mg).  Grapes,  cup (155 mg).  Greens: kale, turnip, collard,  cup (110-150 mg).  Ice cream or frozen yogurt, chocolate,  cup (175 mg).  Ice cream or frozen yogurt, vanilla,  cup (120-150 mg).  Lemons, limes, 1 each (80 mg).  Lettuce, all types, 1 cup (100 mg).  Mixed vegetables,  cup (150 mg).  Mushrooms, raw,  cup (110 mg).  Nuts: walnuts, pecans, or macadamia, 1 oz (125 mg).  Oatmeal,  cup (80 mg).  Okra,  cup (110 mg).  Onions, raw,  cup (120 mg).  Peach, 1 each (185 mg).  Peaches, canned,  cup (120 mg).  Pears, canned,  cup (120 mg).  Peas, green, frozen,  cup (90 mg).  Peppers, green,  cup (130 mg).  Peppers, red,  cup (160 mg).  Pineapple juice,  cup (165 mg).  Pineapple, fresh or canned,  cup (100 mg).  Plums, 1 each (105 mg).  Pudding, vanilla,  cup (150 mg).  Raspberries,  cup (90 mg).  Rhubarb,  cup (115 mg).  Rice, wild,  cup (80 mg).  Shrimp, 3 oz (155 mg).  Spinach, raw, 1 cup (170 mg).  Strawberries,  cup (125 mg).  Summer squash  cup (175-200 mg).  Swiss chard, raw, 1 cup (135 mg).  Tangerines, 1 each (140 mg).  Tea, brewed, 6 oz (65 mg).  Turnips,  cup (140 mg).  Watermelon,  cup (85 mg).  Wine, red, table, 5 oz (180 mg).  Wine, white, table, 5 oz (100  mg). LOW IN POTASSIUM The following foods and beverages have less than 50 mg of potassium per serving.  Bread, white, 1 slice (30 mg).  Carbonated beverages, 12 oz (less than 5 mg).  Cheese, 1 oz (20-30 mg).  Cranberries,  cup (45 mg).  Cranberry juice cocktail,  cup (20 mg).  Fats and oils, 1 Tbsp (less than 5 mg).  Hummus, 1 Tbsp (32 mg).  Nectar: papaya, mango, or pear,  cup (35 mg).  Rice, white or brown,  cup (50 mg).  Spaghetti or macaroni,  cup cooked (30  mg).  Tortilla, flour or corn, 1 each (50 mg).  Waffle, 4 in., 1 each (50 mg).  Water chestnuts,  cup (40 mg).   This information is not intended to replace advice given to you by your health care provider. Make sure you discuss any questions you have with your health care provider.   Document Released: 07/31/2005 Document Revised: 12/22/2013 Document Reviewed: 11/13/2013 Elsevier Interactive Patient Education [employer].

## 2016-05-07 NOTE — Progress Notes (Signed)
OK TO TREAT TODAY PER MARY RN FROM MD MOHAMED WITH PLT AND POTASSIUM LEVEL TODAY, PT WILL ALSO TAKE DECADRON SHE BROUGHT HERE '20MG'$  AND WILL GET THE SOLU MEDROL DOSE TODAY.

## 2016-05-08 ENCOUNTER — Telehealth: Payer: Self-pay

## 2016-05-08 NOTE — Addendum Note (Signed)
Encounter addended by: Kyung Rudd, MD on: 05/08/2016  5:15 PM<BR>     Documentation filed: Follow-up Section, LOS Section

## 2016-05-08 NOTE — Telephone Encounter (Signed)
-----   Message from Herschell Dimes, RN sent at 05/07/2016  3:23 PM EDT ----- Regarding: [name] first time Darzalex f/u Contact: [phone] This Mohamed pt tolerated her first time Velcade and Darzalex well today.  Dema Severin [phone]

## 2016-05-08 NOTE — Telephone Encounter (Signed)
Called Darlene Howard for chemotherapy F/U.  Patient is doing well. Patient complains of pain around her eyes and blurry vision.  Spoke with MM who said ti was not a usual side effect of the chemo.  Advised pt to keep and eye on it and call back if it worsens.  Pt verbalized understanding.  Denies n/v.  Bowel and bladder is functioning well.  Eating and drinking well and I instructed to drink 64 oz minimum daily or at least the day before, of and after treatment.  Denies questions at this time and encouraged to call if needed.  Reviewed how to call after hours in the case of an emergency.

## 2016-05-09 DIAGNOSIS — C7951 Secondary malignant neoplasm of bone: Secondary | ICD-10-CM | POA: Diagnosis not present

## 2016-05-09 DIAGNOSIS — E1142 Type 2 diabetes mellitus with diabetic polyneuropathy: Secondary | ICD-10-CM | POA: Diagnosis not present

## 2016-05-09 DIAGNOSIS — C9000 Multiple myeloma not having achieved remission: Secondary | ICD-10-CM | POA: Diagnosis not present

## 2016-05-09 DIAGNOSIS — Z510 Encounter for antineoplastic radiation therapy: Secondary | ICD-10-CM | POA: Diagnosis not present

## 2016-05-09 DIAGNOSIS — H409 Unspecified glaucoma: Secondary | ICD-10-CM | POA: Diagnosis not present

## 2016-05-09 DIAGNOSIS — Z85038 Personal history of other malignant neoplasm of large intestine: Secondary | ICD-10-CM | POA: Diagnosis not present

## 2016-05-09 NOTE — Telephone Encounter (Addendum)
Return call today reporting her "vision is still blurred.  Has not worsened but time and numbers on Cable Box are blurred.  Does Dr. [name] want to see me tomorrow?  I see PA on Monday.  Return number [phone].

## 2016-05-09 NOTE — Telephone Encounter (Signed)
Still has blurred vision with distance . She does not have blurred vision when she reads up close. She says when she wears her glasses the distance vision is better somewhat. If she needs to be seen tomorrow i told her to let receptionist know at the time she checks in for xrt.

## 2016-05-10 ENCOUNTER — Ambulatory Visit
Admission: RE | Admit: 2016-05-10 | Discharge: 2016-05-10 | Disposition: A | Discharge status: Home / Self Care | Payer: Medicare Other | Source: Ambulatory Visit | Attending: Radiation Oncology | Admitting: Radiation Oncology

## 2016-05-10 ENCOUNTER — Ambulatory Visit (HOSPITAL_BASED_OUTPATIENT_CLINIC_OR_DEPARTMENT_OTHER): Payer: Medicare Other

## 2016-05-10 ENCOUNTER — Other Ambulatory Visit: Payer: Self-pay | Admitting: Medical Oncology

## 2016-05-10 VITALS — BP 143/60 | HR 85 | Temp 98.5°F | Resp 16

## 2016-05-10 DIAGNOSIS — E1142 Type 2 diabetes mellitus with diabetic polyneuropathy: Secondary | ICD-10-CM | POA: Diagnosis not present

## 2016-05-10 DIAGNOSIS — C9000 Multiple myeloma not having achieved remission: Secondary | ICD-10-CM | POA: Insufficient documentation

## 2016-05-10 DIAGNOSIS — H409 Unspecified glaucoma: Secondary | ICD-10-CM | POA: Diagnosis not present

## 2016-05-10 DIAGNOSIS — Z510 Encounter for antineoplastic radiation therapy: Secondary | ICD-10-CM | POA: Diagnosis not present

## 2016-05-10 DIAGNOSIS — C7951 Secondary malignant neoplasm of bone: Secondary | ICD-10-CM | POA: Diagnosis not present

## 2016-05-10 DIAGNOSIS — Z5112 Encounter for antineoplastic immunotherapy: Secondary | ICD-10-CM | POA: Diagnosis not present

## 2016-05-10 DIAGNOSIS — Z85038 Personal history of other malignant neoplasm of large intestine: Secondary | ICD-10-CM | POA: Diagnosis not present

## 2016-05-10 MED ORDER — DEXAMETHASONE 4 MG PO TABS
20.0000 mg | ORAL_TABLET | Freq: Once | ORAL | Status: AC
  Administered 2016-05-10: 20 mg via ORAL

## 2016-05-10 MED ORDER — RADIAPLEXRX EX GEL
Freq: Once | CUTANEOUS | Status: AC
  Administered 2016-05-10: 14:00:00 via TOPICAL

## 2016-05-10 MED ORDER — PROCHLORPERAZINE MALEATE 10 MG PO TABS
ORAL_TABLET | ORAL | Status: AC
  Filled 2016-05-10: qty 1

## 2016-05-10 MED ORDER — PROCHLORPERAZINE MALEATE 10 MG PO TABS
10.0000 mg | ORAL_TABLET | Freq: Once | ORAL | Status: AC
  Administered 2016-05-10: 10 mg via ORAL

## 2016-05-10 MED ORDER — DEXAMETHASONE 4 MG PO TABS
ORAL_TABLET | ORAL | Status: AC
  Filled 2016-05-10: qty 10

## 2016-05-10 MED ORDER — BORTEZOMIB CHEMO SQ INJECTION 3.5 MG (2.5MG/ML)
1.3000 mg/m2 | Freq: Once | INTRAMUSCULAR | Status: AC
  Administered 2016-05-10: 2.5 mg via SUBCUTANEOUS
  Filled 2016-05-10: qty 2.5

## 2016-05-10 NOTE — Progress Notes (Signed)
Pt here for patient teaching.  Pt given Radiation and You booklet and Radiaplex gel. Pt reports they have not watched the Radiation Therapy Education video and has been given the link to watch at home.  Reviewed areas of pertinence such as fatigue and skin changes . Pt able to give teach back of to pat skin and use unscented/gentle soap,apply Radiaplex bid and avoid applying anything to skin within 4 hours of treatment. Pt demonstrated understanding and verbalizes understanding of information given and will contact nursing with any questions or concerns.

## 2016-05-10 NOTE — Patient Instructions (Signed)
[facility] Discharge Instructions for Patients Receiving Chemotherapy  Today you received the following chemotherapy agents Velcade. To help prevent nausea and vomiting after your treatment, we encourage you to take your nausea medication as directed.  If you develop nausea and vomiting that is not controlled by your nausea medication, call the clinic.   BELOW ARE SYMPTOMS THAT SHOULD BE REPORTED IMMEDIATELY:  *FEVER GREATER THAN 100.5 F  *CHILLS WITH OR WITHOUT FEVER  NAUSEA AND VOMITING THAT IS NOT CONTROLLED WITH YOUR NAUSEA MEDICATION  *UNUSUAL SHORTNESS OF BREATH  *UNUSUAL BRUISING OR BLEEDING  TENDERNESS IN MOUTH AND THROAT WITH OR WITHOUT PRESENCE OF ULCERS  *URINARY PROBLEMS  *BOWEL PROBLEMS  UNUSUAL RASH Items with * indicate a potential emergency and should be followed up as soon as possible.  Feel free to call the clinic you have any questions or concerns. The clinic phone number is [phone].  Please show the CHEMO ALERT CARD at check-in to the Emergency Department and triage nurse.    

## 2016-05-11 ENCOUNTER — Ambulatory Visit
Admission: RE | Admit: 2016-05-11 | Discharge: 2016-05-11 | Disposition: A | Discharge status: Home / Self Care | Payer: Medicare Other | Source: Ambulatory Visit | Attending: Radiation Oncology | Admitting: Radiation Oncology

## 2016-05-11 ENCOUNTER — Encounter: Payer: Self-pay | Admitting: Radiation Oncology

## 2016-05-11 ENCOUNTER — Ambulatory Visit: Payer: Medicare Other | Admitting: Radiation Oncology

## 2016-05-11 DIAGNOSIS — C7951 Secondary malignant neoplasm of bone: Secondary | ICD-10-CM | POA: Diagnosis not present

## 2016-05-11 DIAGNOSIS — H409 Unspecified glaucoma: Secondary | ICD-10-CM | POA: Diagnosis not present

## 2016-05-11 DIAGNOSIS — Z85038 Personal history of other malignant neoplasm of large intestine: Secondary | ICD-10-CM | POA: Diagnosis not present

## 2016-05-11 DIAGNOSIS — C9000 Multiple myeloma not having achieved remission: Secondary | ICD-10-CM | POA: Diagnosis not present

## 2016-05-11 DIAGNOSIS — Z510 Encounter for antineoplastic radiation therapy: Secondary | ICD-10-CM | POA: Diagnosis not present

## 2016-05-11 DIAGNOSIS — E1142 Type 2 diabetes mellitus with diabetic polyneuropathy: Secondary | ICD-10-CM | POA: Diagnosis not present

## 2016-05-11 NOTE — Progress Notes (Signed)
Ms. Donalson has received 2 fractions to her right rib.  Skin to right rib with normal color.   Using Radiaplex gel bid.  Appetite is good.  Having fatigue in the afternoon most of the time.  Denies pain,soreness of mouth or swallowing problems on [place]ardon '4mg'$  (20 mg) 5 tablets 4 days a week.   BP 139/53 mmHg  Pulse 68  Temp(Src) 97.7 F (36.5 C) (Oral)  Ht '5\' 6"'$  (1.676 m)  Wt 189 lb 12.8 oz (86.093 kg)  BMI 30.65 kg/m2  SpO2 100%

## 2016-05-13 NOTE — Progress Notes (Addendum)
  Radiation Oncology         [phone] ________________________________  Name: Darlene Howard MRN: [id number]  Date: 05/04/2016  DOB: September 05, 1951  SIMULATION AND TREATMENT PLANNING NOTE  DIAGNOSIS:     ICD-9-CM ICD-10-CM   1. Multiple myeloma not having achieved remission (HCC) 203.00 C90.00      Site:  Right sixth rib posteriorly  NARRATIVE:  The patient was brought to the [facility].  Identity was confirmed.  All relevant records and images related to the planned course of therapy were reviewed.   Written consent to proceed with treatment was confirmed which was freely given after reviewing the details related to the planned course of therapy had been reviewed with the patient.  Then, the patient was set-up in a stable reproducible  supine position for radiation therapy.  CT images were obtained.  Surface markings were placed.      The CT images were loaded into the planning software.  Then the target and avoidance structures were contoured.  Treatment planning then occurred.  The radiation prescription was entered and confirmed.  A total of 3 complex treatment devices were fabricated which relate to the designed radiation treatment fields. Each of these customized fields/ complex treatment devices will be used on a daily basis during the radiation course. I have requested : 3D Simulation  I have requested a DVH of the following structures: Target volume, lungs, spinal cord.   The patient will undergo daily image guidance to ensure accurate localization of the target, and adequate minimize dose to the normal surrounding structures in close proximity to the target.   PLAN:  The patient will receive 30 Gy in 10 fractions.  ________________________________   Jodelle Gross, MD, PhD

## 2016-05-14 ENCOUNTER — Telehealth: Payer: Self-pay | Admitting: Oncology

## 2016-05-14 ENCOUNTER — Ambulatory Visit: Payer: Medicare Other

## 2016-05-14 ENCOUNTER — Ambulatory Visit (HOSPITAL_BASED_OUTPATIENT_CLINIC_OR_DEPARTMENT_OTHER): Payer: Medicare Other | Admitting: Oncology

## 2016-05-14 ENCOUNTER — Ambulatory Visit
Admission: RE | Admit: 2016-05-14 | Discharge: 2016-05-14 | Disposition: A | Discharge status: Home / Self Care | Payer: Medicare Other | Source: Ambulatory Visit | Attending: Radiation Oncology | Admitting: Radiation Oncology

## 2016-05-14 ENCOUNTER — Other Ambulatory Visit (HOSPITAL_BASED_OUTPATIENT_CLINIC_OR_DEPARTMENT_OTHER): Payer: Medicare Other

## 2016-05-14 ENCOUNTER — Encounter: Payer: Self-pay | Admitting: Oncology

## 2016-05-14 ENCOUNTER — Telehealth: Payer: Self-pay | Admitting: *Deleted

## 2016-05-14 VITALS — BP 142/65 | HR 85 | Temp 98.1°F | Resp 18 | Ht 66.0 in | Wt 186.9 lb

## 2016-05-14 DIAGNOSIS — C9000 Multiple myeloma not having achieved remission: Secondary | ICD-10-CM

## 2016-05-14 DIAGNOSIS — R5383 Other fatigue: Secondary | ICD-10-CM | POA: Diagnosis not present

## 2016-05-14 DIAGNOSIS — Z85038 Personal history of other malignant neoplasm of large intestine: Secondary | ICD-10-CM | POA: Diagnosis not present

## 2016-05-14 DIAGNOSIS — E1142 Type 2 diabetes mellitus with diabetic polyneuropathy: Secondary | ICD-10-CM | POA: Diagnosis not present

## 2016-05-14 DIAGNOSIS — R197 Diarrhea, unspecified: Secondary | ICD-10-CM | POA: Diagnosis not present

## 2016-05-14 DIAGNOSIS — H409 Unspecified glaucoma: Secondary | ICD-10-CM | POA: Diagnosis not present

## 2016-05-14 DIAGNOSIS — C7951 Secondary malignant neoplasm of bone: Secondary | ICD-10-CM | POA: Diagnosis not present

## 2016-05-14 DIAGNOSIS — Z510 Encounter for antineoplastic radiation therapy: Secondary | ICD-10-CM | POA: Diagnosis not present

## 2016-05-14 LAB — COMPREHENSIVE METABOLIC PANEL
ALK PHOS: 63 U/L (ref 40–150)
ALT: 23 U/L (ref 0–55)
ANION GAP: 7 meq/L (ref 3–11)
AST: 12 U/L (ref 5–34)
Albumin: 2.9 g/dL — ABNORMAL LOW (ref 3.5–5.0)
BILIRUBIN TOTAL: 1.07 mg/dL (ref 0.20–1.20)
BUN: 12.3 mg/dL (ref 7.0–26.0)
CALCIUM: 9.1 mg/dL (ref 8.4–10.4)
CO2: 23 mEq/L (ref 22–29)
CREATININE: 0.9 mg/dL (ref 0.6–1.1)
Chloride: 109 mEq/L (ref 98–109)
EGFR: 79 mL/min/{1.73_m2} — AB (ref >90)
Glucose: 132 mg/dl (ref 70–140)
Potassium: 4.3 mEq/L (ref 3.5–5.1)
Sodium: 139 mEq/L (ref 136–145)
TOTAL PROTEIN: 6.7 g/dL (ref 6.4–8.3)

## 2016-05-14 LAB — CBC WITH DIFFERENTIAL/PLATELET
BASO%: 0.6 % (ref 0.0–2.0)
BASOS ABS: 0 10*3/uL (ref 0.0–0.1)
EOS ABS: 0.1 10*3/uL (ref 0.0–0.5)
EOS%: 1.8 % (ref 0.0–7.0)
HEMATOCRIT: 26.7 % — AB (ref 34.8–46.6)
HGB: 8.4 g/dL — ABNORMAL LOW (ref 11.6–15.9)
LYMPH%: 6.8 % — AB (ref 14.0–49.7)
MCH: 30 pg (ref 25.1–34.0)
MCHC: 31.5 g/dL (ref 31.5–36.0)
MCV: 95.4 fL (ref 79.5–101.0)
MONO#: 0.6 10*3/uL (ref 0.1–0.9)
MONO%: 16.6 % — AB (ref 0.0–14.0)
NEUT%: 74.2 % (ref 38.4–76.8)
NEUTROS ABS: 2.5 10*3/uL (ref 1.5–6.5)
NRBC: 11 % — AB (ref 0–0)
RBC: 2.8 10*6/uL — ABNORMAL LOW (ref 3.70–5.45)
RDW: 18.1 % — AB (ref 11.2–14.5)
WBC: 3.4 10*3/uL — AB (ref 3.9–10.3)
lymph#: 0.2 10*3/uL — ABNORMAL LOW (ref 0.9–3.3)

## 2016-05-14 NOTE — Telephone Encounter (Signed)
per pof to sch pt appt-gave pt copy of avs-sent MW email pt to get updated copy on 5/16

## 2016-05-14 NOTE — Progress Notes (Signed)
[place] [phone]   [phone]  OFFICE PROGRESS NOTE  Darlene Nakayama, MD [address] [place] [address]  DIAGNOSIS: Stage IIA IgA kappa multiple myeloma diagnosed in 2006   PRIOR THERAPY:  1) status post treatment with thalidomide and Decadron followed by autologous peripheral blood stem cell transplant with high-dose melphalan on 04/26/2006 at [facility].  2) the patient had evidence for disease recurrence in August of 2008 and she was treated with 6 cycles of Velcade completed on 03/30/2008 with response to the treatment but was discontinued secondary to neuropathy.  3) maintenance treatment with Revlimid 10 mg by mouth daily by the does was later increase to 15 mg by mouth daily secondary to biochemical recurrence with stabilization of her disease. The treatment with Revlimid 15 mg by mouth daily was discontinued secondary to evidence for disease progression. 4) Revlimid 25 mg by mouth daily for 3 weeks every 4 weeks in addition to Decadron currently at 20 mg by mouth on a weekly basis. She is status post 12 cycles. This was discontinued secondary to disease progression.   CURRENT THERAPY:  1) systemic chemotherapy with Daratumumab 16 MG/KG weekly for the first 9 weeks, Velcade 1.3 MG/M2 subcutaneously on days 1, 4, 8 and 11 every 3 weeks in addition to Decadron 20 mg by mouth on days 1, 2, 4, 5, 8, 9, 11 and 12 every 3 weeks. First dose 05/07/2016 2) Zometa 4 mg IV every 3 months. First dose in 05/05/2015  ADVANCED DIRECTIVE: She does not have advanced directive and was given information.  INTERVAL HISTORY: Darlene Howard 65 y.o. female returns to the clinic today for routine followup visit accompanied by her brother. The patient started treatment with Velcade and daratumumab on 05/07/2016. She seems to tolerated her first doses well. She does report ongoing fatigue, but unchanged from previous reports. She has started  radiation to her right chest wall. The patient denied having any fever or chills. She denied having any shortness of breath, cough or hemoptysis. She has no weight loss or night sweats. She developed loose stools during the night, but has only had 2 loose stools. She has taken Imodium which is been effective for her. Denies bleeding. Reports irritation and redness at the Velcade injection sites on her abdomen. Denies itching. She is here for consideration of cycle 1 day 8 of her chemotherapy.  MEDICAL HISTORY: Past Medical History  Diagnosis Date  . Glaucoma     uses eye drops as instructed  . Personal history of colon cancer     ASCENDING COLON--  S/P  RIGHT HEMICOLECTOMY WITH NEGATIVE NODES/    NO RECURRENCE  . Herpes     takes Valtrex as needed  . GERD (gastroesophageal reflux disease)     takes Pantoprazole daily  . Hypertension     takes Amlodipine and Diovan daily  . Bruises easily     d/t being on COumadin  . History of colon polyps   . Type 2 diabetes mellitus ([place])   . 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 H/O stem cell transplant ([place])     04-26-2006  AT [facility]  . Multiple myeloma DX  2006  STATE IIA,  IgA  KAPPA    STABLE  PER DR MOHAMED--  S/P STEM CELL TRANSPLANT 2007 /  RECURRENCE 2008  CHEMO ENDED 03-30-2008/   NOW ON  MAINTANANCE REVLIMID )  . Vocal cord nodule     resolved- see Dr. Deeann Saint notes in media tab 06/17/14  . DDD (degenerative disc disease), lumbar   . Cholelithiasis   . Fatty liver   . Lung cancer ([place])     chest wall lesion aadn 6th rib    ALLERGIES:  is allergic to codeine and tramadol.  MEDICATIONS:  Current Outpatient Prescriptions  Medication Sig Dispense Refill  . acetaminophen (TYLENOL) 500 MG tablet Take 500 mg by mouth daily as needed for moderate pain.    Marland Kitchen amLODipine (NORVASC) 2.5 MG tablet Take 1 tablet by mouth  daily in the morning 90 tablet 0  . aspirin (ASPIRIN LOW DOSE) 81  MG EC tablet Take 81 mg by mouth daily.      . calcium-vitamin D (OSCAL 500/200 D-3) 500-200 MG-UNIT per tablet Take 1 tablet by mouth 3 (three) times daily.      . cholecalciferol (VITAMIN D) 1000 UNITS tablet Take 1,000 Units by mouth daily. Reported on 04/24/2016    . dexamethasone (DECADRON) 4 MG tablet 5 tablets by mouth daily on days 1, 2, 4, 5, 8, 9, 11 and 12 every 3 weeks starting with the first dose of chemotherapy on 05/07/2016 10 tablet 1  . dorzolamide-timolol (COSOPT) 22.3-6.8 MG/ML ophthalmic solution Place 1 drop into the left eye 2 (two) times daily.     Marland Kitchen gabapentin (NEURONTIN) 100 MG capsule Take 1 capsule by mouth 3  times daily (Patient taking differently: Take 300 mg by mouth at bedtime) 270 capsule 0  . hyaluronate sodium (RADIAPLEXRX) GEL Apply 1 application topically 2 (two) times daily.    Marland Kitchen loperamide (IMODIUM A-D) 2 MG tablet Take 2 mg by mouth daily as needed for diarrhea or loose stools.    Marland Kitchen loratadine (CLARITIN) 10 MG tablet TAKE ONE TABLET BY MOUTH ONCE DAILY. (Patient taking differently: TAKE 10 MG BY MOUTH ONCE DAILY.) 30 tablet 3  . LUMIGAN 0.01 % SOLN Place 1 drop into both eyes at bedtime.     . metFORMIN (GLUCOPHAGE) 500 MG tablet Take 1 tablet (500 mg total) by mouth daily with breakfast. 90 tablet 3  . Multiple Vitamins-Minerals (CENTRUM SILVER PO) Take 1 tablet by mouth daily.     . ondansetron (ZOFRAN ODT) 4 MG disintegrating tablet Take 1 tablet (4 mg total) by mouth every 8 (eight) hours as needed for nausea or vomiting. 20 tablet 0  . ondansetron (ZOFRAN) 4 MG tablet Take 1 tablet (4 mg total) by mouth every 6 (six) hours. 10 tablet 0  . ONE TOUCH ULTRA TEST test strip USE AS DIRECTED TO CHECK DAILY. 50 each 3  . oxyCODONE-acetaminophen (PERCOCET/ROXICET) 5-325 MG tablet Take 1 tablet by mouth every 8 (eight) hours as needed for severe pain. 40 tablet 0  . pantoprazole (PROTONIX) 40 MG tablet TAKE 1 TABLET BY MOUTH  DAILY FOR ACID REFLUX (Patient taking  differently: TAKE 40 MG BY MOUTH  DAILY FOR ACID REFLUX) 90 tablet 0  . potassium chloride SA (K-DUR,KLOR-CON) 20 MEQ tablet Take 1 tablet by mouth  twice a day (Patient taking differently: Take 20 meq by mouth twice daily) 180 tablet 0  . PREMARIN vaginal cream PLACE 1 APPLICATORFUL VAGINALLY TWICE A WEEK. 30 g 0  . valACYclovir (VALTREX) 1000 MG tablet Take 1 tablet by mouth  daily as needed (Patient taking differently: Take 1 tablet by mouth  daily as needed for outbreaks) 90 tablet 1  . valsartan-hydrochlorothiazide (DIOVAN-HCT) 320-25 MG  tablet Take 1 tablet by mouth  daily 90 tablet 0  . oxyCODONE (ROXICODONE) 5 MG immediate release tablet Take 1 tablet (5 mg total) by mouth every 4 (four) hours as needed for severe pain. (Patient not taking: Reported on 05/11/2016) 30 tablet 0   No current facility-administered medications for this visit.    SURGICAL HISTORY:  Past Surgical History  Procedure Laterality Date  . Colonoscopy  10/07/2006    FWY:OVZCHY rectum/Status post right hemicolectomy. Normal residual colon  . Cervical cone biopsy    . Cataract extraction w/phaco Left 01/20/2014    Procedure: CATARACT EXTRACTION PHACO AND INTRAOCULAR LENS PLACEMENT (IOC);  Surgeon: Adonis Brook, MD;  Location: [facility];  Service: Ophthalmology;  Laterality: Left;  . 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 Co2  ablation vulva perianal and vaginal dysplatic areas  02-15-2010  . Abdominal hysterectomy  1983    w/  appendectomy and left salpingoophorectom  . Hemicolectomy Right 04-27-2005    CARCINOMA   ASCENDING COLON  . Vulvectomy N/A 05/06/2014    Procedure: WIDE LOCAL EXCISION LEFT LABIA MAJORA;  Surgeon: Janie Morning, MD;  Location: [facility];  Service: Gynecology;  Laterality: N/A;  . Vulvar lesion removal N/A 05/06/2014    Procedure: LASER OF VULVAR LESION;  Surgeon: Janie Morning, MD;  Location: [facility];  Service: Gynecology;  Laterality: N/A;  .  Lesion removal N/A 05/06/2014    Procedure: LASER OF THE VAGINA;  Surgeon: Janie Morning, MD;  Location: [facility];  Service: Gynecology;  Laterality: N/A;  . Esophagogastroduodenoscopy  04/02/2005    IFO:YDXAJO/INOMVEHMCNOB polyp with central erosion versus ulceration in the body of the stomach, resected and recovered Remainder of the gastric mucosa, D1, D2 appeared normal  . Colonoscopy  10/2009    RMR: diminutive RS polyp (hyperplastic).   . Colonoscopy N/A 12/15/2014    Procedure: COLONOSCOPY;  Surgeon: Daneil Dolin, MD;  [facility];  Service: Endoscopy;  Laterality: N/A;  130   . Cholecystectomy N/A 02/28/2016    Procedure: LAPAROSCOPIC CHOLECYSTECTOMY;  Surgeon: Ralene Ok, MD;  Location: [place];  Service: General;  Laterality: N/A;    REVIEW OF SYSTEMS:  Constitutional: positive for fatigue Eyes: negative Ears, nose, mouth, throat, and face: negative Respiratory: positive for pleurisy/chest pain Cardiovascular: negative Gastrointestinal: negative Genitourinary:negative Integument/breast: negative Hematologic/lymphatic: negative Musculoskeletal:negative Neurological: negative Behavioral/Psych: negative Endocrine: negative Allergic/Immunologic: negative   PHYSICAL EXAMINATION: General appearance: alert, cooperative and no distress Head: Normocephalic, without obvious abnormality, atraumatic Neck: no adenopathy Lymph nodes: Cervical, supraclavicular, and axillary nodes normal. Resp: clear to auscultation bilaterally Back: symmetric, no curvature. ROM normal. No CVA tenderness. Cardio: regular rate and rhythm, S1, S2 normal, no murmur, click, rub or gallop GI: soft, non-tender; bowel sounds normal; no masses,  no organomegaly Extremities: extremities normal, atraumatic, no cyanosis or edema Neurologic: Alert and oriented X 3, normal strength and tone. Normal symmetric reflexes. Normal coordination and gait  ECOG PERFORMANCE STATUS: 1 -  Symptomatic but completely ambulatory  Blood pressure 142/65, pulse 85, temperature 98.1 F (36.7 C), temperature source Oral, resp. rate 18, height '5\' 6"'  (1.676 m), weight 186 lb 14.4 oz (84.777 kg), SpO2 100 %.  LABORATORY DATA: Lab Results  Component Value Date   WBC 3.4* 05/14/2016   HGB 8.4* 05/14/2016   HCT 26.7* 05/14/2016   MCV 95.4 05/14/2016   PLT 41 Large platelets present* 05/14/2016      Chemistry  Component Value Date/Time   NA 139 05/14/2016 0805   NA 138 04/24/2016 2003   K 4.3 05/14/2016 0805   K 3.6 04/24/2016 2003   CL 101 04/24/2016 2003   CL 105 06/17/2013 1441   CO2 23 05/14/2016 0805   CO2 26 04/24/2016 2003   BUN 12.3 05/14/2016 0805   BUN 7 04/24/2016 2003   CREATININE 0.9 05/14/2016 0805   CREATININE 0.93 04/24/2016 2003   CREATININE 0.76 01/26/2016 1646      Component Value Date/Time   CALCIUM 9.1 05/14/2016 0805   CALCIUM 9.8 04/24/2016 2003   ALKPHOS 63 05/14/2016 0805   ALKPHOS 58 04/24/2016 2003   AST 12 05/14/2016 0805   AST 14* 04/24/2016 2003   ALT 23 05/14/2016 0805   ALT 14 04/24/2016 2003   BILITOT 1.07 05/14/2016 0805   BILITOT 1.3* 04/24/2016 2003       RADIOGRAPHIC STUDIES: Dg Chest 2 View  04/24/2016  CLINICAL DATA:  Right flank pain EXAM: CHEST  2 VIEW COMPARISON:  12/05/2013 FINDINGS: Rounded 5.6 cm mass over the right chest which is newly seen, peripheral or subpleural. There is suggestion of a 1 cm left lung nodule. Appearance concerning for malignancy/metastatic disease. No associated air bronchograms. Normal heart size and mediastinal contours. No acute osseous finding. IMPRESSION: 6 cm mass over the right chest. Possible 1 cm left lung nodule. Recommend chest CT. Electronically Signed   By: Monte Fantasia M.D.   On: 04/24/2016 23:15   Ct Chest W Contrast  04/25/2016  CLINICAL DATA:  Right-sided chest pain. EXAM: CT CHEST WITH CONTRAST TECHNIQUE: Multidetector CT imaging of the chest was performed during  intravenous contrast administration. CONTRAST:  71m ISOVUE-300 IOPAMIDOL (ISOVUE-300) INJECTION 61% COMPARISON:  None. FINDINGS: THORACIC INLET/BODY WALL: No acute abnormality. MEDIASTINUM: Mediastinal adenopathy with 13 mm subcarinal node and rounded esophageal hiatus node measuring 15 mm. Bilateral posterior mediastinal nodules, greater on the right at the level of the destructive rib mass where a discrete nodule measures up to 3 cm. The rib mass is discrete from these posterior mediastinal nodules. Normal heart size. Atherosclerosis, including the coronary arteries. No acute vascular finding LUNG WINDOWS: Mild dependent atelectasis.  No primary mass lesion is seen. 5 mm right upper lobe pulmonary nodule. Trace pleural effusions. UPPER ABDOMEN: No acute findings. OSSEOUS: Large posterior right sixth rib mass with extraosseous subpleural growth accounting for the chest x-ray abnormality. The mass measures up to 52 mm in maximal axial dimension. There are are occasional tiny cortical lucencies which are likely myelomatous. Approximately 1 cm lytic lesion in the lateral margin right scapula. Marked osteopenia IMPRESSION: 1. The right chest mass is a large destructive sixth rib lesion. This and other lytic bone lesions are presumably related to patient's history of myeloma. 2. Posterior mediastinal nodules and mediastinal adenopathy could be myelomatous, lymphoma or less likely metastatic disease not excluded. 3. 5 mm upper lobe pulmonary nodule, attention on surveillance imaging. Electronically Signed   By: JMonte FantasiaM.D.   On: 04/25/2016 01:22   Dg Bone Survey Met  05/01/2016  CLINICAL DATA:  Multiple myeloma. EXAM: METASTATIC BONE SURVEY COMPARISON:  04/24/2016 .  03/28/2012. FINDINGS: 6 cm mass lesion noted projected right upper chest with destruction of the right posterior sixth rib. Progressive nodular density noted projected over the left mid chest. Lytic lesions are noted in the skull. Lytic lesion in  the right scapula noted. Lytic lesion in the distal right humerus noted. Lytic lesion in the right radius. Lytic  lesion noted mid left radius. Mild new compression L2. Stable anterolisthesis L4 on L5. Diffuse osteopenia is present. Peripheral vascular calcification present. IMPRESSION: 1. 6 cm expansile mass involving the right posterior seventh rib. 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 Metastatic disease cannot be excluded . Progression of ill-defined pulmonary versus rib lesion noted over the left mid chest. 2. Widespread bony lucencies consistent with myeloma, particularly prominent within the skull. 3. Mild compression L2. Electronically Signed   By: Marcello Moores  Register   On: 05/01/2016 12:12   Ct Renal Stone Study  04/24/2016  CLINICAL DATA:  Acute onset of upper abdominal pain, radiating to the right and about the back. Initial encounter. EXAM: CT ABDOMEN AND PELVIS WITHOUT CONTRAST TECHNIQUE: Multidetector CT imaging of the abdomen and pelvis was performed following the standard protocol without IV contrast. COMPARISON:  CT of the chest, abdomen and pelvis from 06/02/2010, and MRI of the lumbar spine performed 03/01/2014 FINDINGS: Trace right-sided pleural fluid is noted. Minimal bibasilar atelectasis or scarring is seen. The liver and spleen are unremarkable in appearance. The patient is status post cholecystectomy. The pancreas and adrenal glands are unremarkable. Mild nonspecific perinephric stranding is noted bilaterally. There is no evidence of hydronephrosis. No renal or ureteral stones are identified. No free fluid is identified. The small bowel is unremarkable in appearance. The stomach is within normal limits. No acute vascular abnormalities are seen. A small umbilical hernia is seen, containing only fat. The patient is status post partial colectomy, with an ileocolic anastomosis at the proximal transverse colon. The anastomosis is unremarkable in appearance. The  remaining colon is unremarkable in appearance. The bladder is decompressed and not well assessed. The patient is status post hysterectomy. No suspicious adnexal masses are seen. No inguinal lymphadenopathy is seen. No acute osseous abnormalities are identified. There is grade 1 anterolisthesis of L4 on L5, with underlying facet disease. There is mild chronic loss of height at the superior endplate of L2. Lateral osteophytes are noted along the lower thoracic spine. IMPRESSION: 1. No acute abnormality seen to explain the patient's symptoms. 2. Trace right-sided pleural fluid noted. Minimal bibasilar atelectasis or scarring seen. 3. Small umbilical hernia, containing only fat. 4. Ileocolic anastomosis is unremarkable in appearance. 5. Mild degenerative change along the lumbar spine. Electronically Signed   By: Garald Balding M.D.   On: 04/24/2016 23:37    ASSESSMENT AND PLAN: This is a very pleasant 65 year old African American female with history of multiple myeloma currently on treatment with Revlimid and low-dose Decadron and tolerated her treatment fairly well. The patient is doing fine today with no specific complaints except for mild fatigue.  Because of disease progression, I recommended for her to continue her treatment with Revlimid and Decadron but I increased the dose of Revlimid to 25 mg by mouth daily for 21 days every 4 weeks, status post 12 months of this regimen. The recent myeloma panel performed at [facility] showed evidence for disease progression. The patient also has enlarging right-sided chest wall lesion. Repeat skeletal bone survey was overall stable.  The patient is now receiving Daratumumab, Velcade and Decadron. CBC reviewed with Dr. [name]. Platelet count is low today so we will hold off on her chemotherapy for this week. Discussed bleeding precautions with the patient. She was instructed to discontinue her Coumadin as she is no longer on Revlimid.  Continue radiation to  the right chest wall for palliation of her pain.  The patient will continue  her treatment with Zometa every 3 months as a scheduled. Last dose was given on 04/27/2016.  Discussed use of Imodium with the patient for control of her diarrhea. She will contact us if the Imodium was not controlling her symptoms.  She will come back for follow-up visit in 1 week for evaluation and management of any adverse effect of her treatment.  She was advised to call immediately if she has any concerning symptoms and interval.  The patient voices understanding of current disease status and treatment options and is in agreement with the current care plan.  All questions were answered. The patient knows to call the clinic with any problems, questions or concerns. We can certainly see the patient much sooner if necessary.   Mikey Bussing, DNP, AGPCNP-BC, AOCNP

## 2016-05-14 NOTE — Telephone Encounter (Signed)
Per staff message and POF I have scheduled appts. Advised scheduler of appts. JMW  

## 2016-05-14 NOTE — Patient Instructions (Signed)
May use imodium for diarrhea. Take 2 tabs after 1st loose stool and then 1 tab after each additional loose stool. Maximum of 8 tabs in a 24 hours period.

## 2016-05-15 ENCOUNTER — Ambulatory Visit
Admission: RE | Admit: 2016-05-15 | Discharge: 2016-05-15 | Disposition: A | Discharge status: Home / Self Care | Payer: Medicare Other | Source: Ambulatory Visit | Attending: Radiation Oncology | Admitting: Radiation Oncology

## 2016-05-15 DIAGNOSIS — E1142 Type 2 diabetes mellitus with diabetic polyneuropathy: Secondary | ICD-10-CM | POA: Diagnosis not present

## 2016-05-15 DIAGNOSIS — H409 Unspecified glaucoma: Secondary | ICD-10-CM | POA: Diagnosis not present

## 2016-05-15 DIAGNOSIS — C7951 Secondary malignant neoplasm of bone: Secondary | ICD-10-CM | POA: Diagnosis not present

## 2016-05-15 DIAGNOSIS — Z85038 Personal history of other malignant neoplasm of large intestine: Secondary | ICD-10-CM | POA: Diagnosis not present

## 2016-05-15 DIAGNOSIS — Z510 Encounter for antineoplastic radiation therapy: Secondary | ICD-10-CM | POA: Diagnosis not present

## 2016-05-15 DIAGNOSIS — C9000 Multiple myeloma not having achieved remission: Secondary | ICD-10-CM | POA: Diagnosis not present

## 2016-05-16 ENCOUNTER — Other Ambulatory Visit: Payer: Medicare Other

## 2016-05-16 ENCOUNTER — Ambulatory Visit
Admission: RE | Admit: 2016-05-16 | Discharge: 2016-05-16 | Disposition: A | Discharge status: Home / Self Care | Payer: Medicare Other | Source: Ambulatory Visit | Attending: Radiation Oncology | Admitting: Radiation Oncology

## 2016-05-16 ENCOUNTER — Ambulatory Visit: Payer: Medicare Other | Admitting: Internal Medicine

## 2016-05-16 DIAGNOSIS — Z510 Encounter for antineoplastic radiation therapy: Secondary | ICD-10-CM | POA: Diagnosis not present

## 2016-05-16 DIAGNOSIS — C7951 Secondary malignant neoplasm of bone: Secondary | ICD-10-CM | POA: Diagnosis not present

## 2016-05-16 DIAGNOSIS — C9000 Multiple myeloma not having achieved remission: Secondary | ICD-10-CM | POA: Diagnosis not present

## 2016-05-16 DIAGNOSIS — H409 Unspecified glaucoma: Secondary | ICD-10-CM | POA: Diagnosis not present

## 2016-05-16 DIAGNOSIS — Z85038 Personal history of other malignant neoplasm of large intestine: Secondary | ICD-10-CM | POA: Diagnosis not present

## 2016-05-16 DIAGNOSIS — E1142 Type 2 diabetes mellitus with diabetic polyneuropathy: Secondary | ICD-10-CM | POA: Diagnosis not present

## 2016-05-17 ENCOUNTER — Ambulatory Visit
Admission: RE | Admit: 2016-05-17 | Discharge: 2016-05-17 | Disposition: A | Discharge status: Home / Self Care | Payer: Medicare Other | Source: Ambulatory Visit | Attending: Radiation Oncology | Admitting: Radiation Oncology

## 2016-05-17 ENCOUNTER — Ambulatory Visit: Payer: Medicare Other

## 2016-05-17 DIAGNOSIS — H409 Unspecified glaucoma: Secondary | ICD-10-CM | POA: Diagnosis not present

## 2016-05-17 DIAGNOSIS — C7951 Secondary malignant neoplasm of bone: Secondary | ICD-10-CM | POA: Diagnosis not present

## 2016-05-17 DIAGNOSIS — E1142 Type 2 diabetes mellitus with diabetic polyneuropathy: Secondary | ICD-10-CM | POA: Diagnosis not present

## 2016-05-17 DIAGNOSIS — Z510 Encounter for antineoplastic radiation therapy: Secondary | ICD-10-CM | POA: Diagnosis not present

## 2016-05-17 DIAGNOSIS — Z85038 Personal history of other malignant neoplasm of large intestine: Secondary | ICD-10-CM | POA: Diagnosis not present

## 2016-05-17 DIAGNOSIS — C9000 Multiple myeloma not having achieved remission: Secondary | ICD-10-CM | POA: Diagnosis not present

## 2016-05-18 ENCOUNTER — Other Ambulatory Visit: Payer: Self-pay | Admitting: Family Medicine

## 2016-05-18 ENCOUNTER — Ambulatory Visit
Admission: RE | Admit: 2016-05-18 | Discharge: 2016-05-18 | Disposition: A | Discharge status: Home / Self Care | Payer: Medicare Other | Source: Ambulatory Visit | Attending: Radiation Oncology | Admitting: Radiation Oncology

## 2016-05-18 ENCOUNTER — Encounter: Payer: Self-pay | Admitting: Radiation Oncology

## 2016-05-18 ENCOUNTER — Other Ambulatory Visit: Payer: Self-pay | Admitting: Medical Oncology

## 2016-05-18 VITALS — BP 124/58 | HR 97 | Temp 97.7°F | Ht 66.0 in | Wt 185.5 lb

## 2016-05-18 DIAGNOSIS — E1142 Type 2 diabetes mellitus with diabetic polyneuropathy: Secondary | ICD-10-CM | POA: Diagnosis not present

## 2016-05-18 DIAGNOSIS — C7951 Secondary malignant neoplasm of bone: Secondary | ICD-10-CM | POA: Diagnosis not present

## 2016-05-18 DIAGNOSIS — C9000 Multiple myeloma not having achieved remission: Secondary | ICD-10-CM

## 2016-05-18 DIAGNOSIS — Z510 Encounter for antineoplastic radiation therapy: Secondary | ICD-10-CM | POA: Diagnosis not present

## 2016-05-18 DIAGNOSIS — Z85038 Personal history of other malignant neoplasm of large intestine: Secondary | ICD-10-CM | POA: Diagnosis not present

## 2016-05-18 DIAGNOSIS — H409 Unspecified glaucoma: Secondary | ICD-10-CM | POA: Diagnosis not present

## 2016-05-18 NOTE — Progress Notes (Signed)
Darlene Howard has received 7 fractions to the right rib region. She denies any pain presently, but reports fatigue, No changes in her normal pigmentation in the right rib region as compared to the same region on her left rib region.

## 2016-05-18 NOTE — Progress Notes (Signed)
Department of Radiation Oncology  Phone:  [phone] Fax:        [phone]  Weekly Treatment Note    Name: [name] Date: 05/18/2016 MRN: [id number] DOB: 1951/06/22   Diagnosis:     ICD-9-CM ICD-10-CM   1. Multiple myeloma not having achieved remission ([place]) 203.00 C90.00      Current dose: 21 Gy  Current fraction: 7   MEDICATIONS: Current Outpatient Prescriptions  Medication Sig Dispense Refill  . acetaminophen (TYLENOL) 500 MG tablet Take 500 mg by mouth daily as needed for moderate pain.    Marland Kitchen amLODipine (NORVASC) 2.5 MG tablet Take 1 tablet by mouth  daily in the morning 90 tablet 0  . aspirin (ASPIRIN LOW DOSE) 81 MG EC tablet Take 81 mg by mouth daily.      . calcium-vitamin D (OSCAL 500/200 D-3) 500-200 MG-UNIT per tablet Take 1 tablet by mouth 3 (three) times daily.      . cholecalciferol (VITAMIN D) 1000 UNITS tablet Take 1,000 Units by mouth daily. Reported on 04/24/2016    . dexamethasone (DECADRON) 4 MG tablet 5 tablets by mouth daily on days 1, 2, 4, 5, 8, 9, 11 and 12 every 3 weeks starting with the first dose of chemotherapy on 05/07/2016 10 tablet 1  . dorzolamide-timolol (COSOPT) 22.3-6.8 MG/ML ophthalmic solution Place 1 drop into the left eye 2 (two) times daily.     Marland Kitchen gabapentin (NEURONTIN) 100 MG capsule Take 1 capsule by mouth 3  times daily 270 capsule 1  . hyaluronate sodium (RADIAPLEXRX) GEL Apply 1 application topically 2 (two) times daily.    Marland Kitchen loperamide (IMODIUM A-D) 2 MG tablet Take 2 mg by mouth daily as needed for diarrhea or loose stools.    Marland Kitchen loratadine (CLARITIN) 10 MG tablet TAKE ONE TABLET BY MOUTH ONCE DAILY. (Patient taking differently: TAKE 10 MG BY MOUTH ONCE DAILY.) 30 tablet 3  . LUMIGAN 0.01 % SOLN Place 1 drop into both eyes at bedtime.     . metFORMIN (GLUCOPHAGE) 500 MG tablet Take 1 tablet (500 mg total) by mouth daily with breakfast. 90 tablet 3  . Multiple Vitamins-Minerals (CENTRUM SILVER PO) Take 1 tablet by mouth  daily.     . ondansetron (ZOFRAN ODT) 4 MG disintegrating tablet Take 1 tablet (4 mg total) by mouth every 8 (eight) hours as needed for nausea or vomiting. 20 tablet 0  . ondansetron (ZOFRAN) 4 MG tablet Take 1 tablet (4 mg total) by mouth every 6 (six) hours. 10 tablet 0  . ONE TOUCH ULTRA TEST test strip USE AS DIRECTED TO CHECK DAILY. 50 each 3  . oxyCODONE-acetaminophen (PERCOCET/ROXICET) 5-325 MG tablet Take 1 tablet by mouth every 8 (eight) hours as needed for severe pain. 40 tablet 0  . pantoprazole (PROTONIX) 40 MG tablet TAKE 1 TABLET BY MOUTH  DAILY FOR ACID REFLUX 90 tablet 1  . potassium chloride SA (K-DUR,KLOR-CON) 20 MEQ tablet Take 1 tablet by mouth  twice a day (Patient taking differently: Take 20 meq by mouth twice daily) 180 tablet 0  . PREMARIN vaginal cream PLACE 1 APPLICATORFUL VAGINALLY TWICE A WEEK. 30 g 0  . valACYclovir (VALTREX) 1000 MG tablet Take 1 tablet by mouth  daily as needed (Patient taking differently: Take 1 tablet by mouth  daily as needed for outbreaks) 90 tablet 1  . valsartan-hydrochlorothiazide (DIOVAN-HCT) 320-25 MG tablet Take 1 tablet by mouth  daily 90 tablet 0  . oxyCODONE (ROXICODONE) 5 MG immediate release  tablet Take 1 tablet (5 mg total) by mouth every 4 (four) hours as needed for severe pain. (Patient not taking: Reported on 05/11/2016) 30 tablet 0   No current facility-administered medications for this encounter.     ALLERGIES: Codeine and Tramadol   LABORATORY DATA:  Lab Results  Component Value Date   WBC 3.4* 05/14/2016   HGB 8.4* 05/14/2016   HCT 26.7* 05/14/2016   MCV 95.4 05/14/2016   PLT 41 Large platelets present* 05/14/2016   Lab Results  Component Value Date   NA 139 05/14/2016   K 4.3 05/14/2016   CL 101 04/24/2016   CO2 23 05/14/2016   Lab Results  Component Value Date   ALT 23 05/14/2016   AST 12 05/14/2016   ALKPHOS 63 05/14/2016   BILITOT 1.07 05/14/2016     NARRATIVE: Darlene Howard was seen today for  weekly treatment management. The chart was checked and the patient's films were reviewed.  Ms. Zarazua has received 7 fractions to the right rib region. She denies any pain presently, but reports fatigue, No changes in her normal pigmentation in the right rib region as compared to the same region on her left rib region.  PHYSICAL EXAMINATION: height is 5' 6" (1.676 m) and weight is 185 lb 8 oz (84.142 kg). Her temperature is 97.7 F (36.5 C). Her blood pressure is 124/58 and her pulse is 97.        ASSESSMENT: The patient is doing satisfactorily with treatment.  PLAN: We will continue with the patient's radiation treatment as planned. The patient will complete her course of radiation treatment next week.

## 2016-05-19 DIAGNOSIS — E119 Type 2 diabetes mellitus without complications: Secondary | ICD-10-CM | POA: Diagnosis not present

## 2016-05-19 DIAGNOSIS — E669 Obesity, unspecified: Secondary | ICD-10-CM | POA: Diagnosis not present

## 2016-05-19 LAB — BASIC METABOLIC PANEL WITH GFR
BUN: 12 mg/dL (ref 7–25)
CALCIUM: 8.8 mg/dL (ref 8.6–10.4)
CO2: 21 mmol/L (ref 20–31)
CREATININE: 0.77 mg/dL (ref 0.50–0.99)
Chloride: 106 mmol/L (ref 98–110)
GFR, EST NON AFRICAN AMERICAN: 81 mL/min (ref >=60)
Glucose, Bld: 126 mg/dL — ABNORMAL HIGH (ref 65–99)
Potassium: 3.7 mmol/L (ref 3.5–5.3)
Sodium: 138 mmol/L (ref 135–146)

## 2016-05-19 LAB — HEMOGLOBIN A1C
HEMOGLOBIN A1C: 5.4 % (ref <5.7)
MEAN PLASMA GLUCOSE: 108 mg/dL

## 2016-05-21 ENCOUNTER — Ambulatory Visit: Payer: Medicare Other

## 2016-05-21 ENCOUNTER — Other Ambulatory Visit: Payer: Self-pay | Admitting: Radiation Oncology

## 2016-05-21 ENCOUNTER — Telehealth: Payer: Self-pay | Admitting: Internal Medicine

## 2016-05-21 ENCOUNTER — Other Ambulatory Visit (HOSPITAL_BASED_OUTPATIENT_CLINIC_OR_DEPARTMENT_OTHER): Payer: Medicare Other

## 2016-05-21 ENCOUNTER — Ambulatory Visit (HOSPITAL_BASED_OUTPATIENT_CLINIC_OR_DEPARTMENT_OTHER): Payer: Medicare Other | Admitting: Oncology

## 2016-05-21 ENCOUNTER — Ambulatory Visit
Admission: RE | Admit: 2016-05-21 | Discharge: 2016-05-21 | Disposition: A | Discharge status: Home / Self Care | Payer: Medicare Other | Source: Ambulatory Visit | Attending: Radiation Oncology | Admitting: Radiation Oncology

## 2016-05-21 ENCOUNTER — Encounter: Payer: Self-pay | Admitting: Oncology

## 2016-05-21 VITALS — BP 111/55 | HR 98 | Temp 97.7°F | Resp 18 | Ht 66.0 in | Wt 184.3 lb

## 2016-05-21 DIAGNOSIS — Z510 Encounter for antineoplastic radiation therapy: Secondary | ICD-10-CM | POA: Diagnosis not present

## 2016-05-21 DIAGNOSIS — C9000 Multiple myeloma not having achieved remission: Secondary | ICD-10-CM

## 2016-05-21 DIAGNOSIS — K208 Other esophagitis: Secondary | ICD-10-CM

## 2016-05-21 DIAGNOSIS — T66XXXA Radiation sickness, unspecified, initial encounter: Secondary | ICD-10-CM

## 2016-05-21 DIAGNOSIS — R5383 Other fatigue: Secondary | ICD-10-CM

## 2016-05-21 DIAGNOSIS — D696 Thrombocytopenia, unspecified: Secondary | ICD-10-CM | POA: Diagnosis not present

## 2016-05-21 DIAGNOSIS — H409 Unspecified glaucoma: Secondary | ICD-10-CM | POA: Diagnosis not present

## 2016-05-21 DIAGNOSIS — C7951 Secondary malignant neoplasm of bone: Secondary | ICD-10-CM | POA: Diagnosis not present

## 2016-05-21 DIAGNOSIS — E1142 Type 2 diabetes mellitus with diabetic polyneuropathy: Secondary | ICD-10-CM | POA: Diagnosis not present

## 2016-05-21 DIAGNOSIS — C9002 Multiple myeloma in relapse: Secondary | ICD-10-CM

## 2016-05-21 DIAGNOSIS — Z85038 Personal history of other malignant neoplasm of large intestine: Secondary | ICD-10-CM | POA: Diagnosis not present

## 2016-05-21 LAB — COMPREHENSIVE METABOLIC PANEL
ALK PHOS: 60 U/L (ref 40–150)
ALT: 16 U/L (ref 0–55)
AST: 11 U/L (ref 5–34)
Albumin: 3 g/dL — ABNORMAL LOW (ref 3.5–5.0)
Anion Gap: 9 mEq/L (ref 3–11)
BUN: 14 mg/dL (ref 7.0–26.0)
CALCIUM: 9 mg/dL (ref 8.4–10.4)
CHLORIDE: 109 meq/L (ref 98–109)
CO2: 21 mEq/L — ABNORMAL LOW (ref 22–29)
Creatinine: 0.8 mg/dL (ref 0.6–1.1)
EGFR: 85 mL/min/{1.73_m2} — AB (ref >90)
Glucose: 127 mg/dl (ref 70–140)
POTASSIUM: 3.7 meq/L (ref 3.5–5.1)
Sodium: 139 mEq/L (ref 136–145)
Total Bilirubin: 0.85 mg/dL (ref 0.20–1.20)
Total Protein: 6.6 g/dL (ref 6.4–8.3)

## 2016-05-21 LAB — CBC WITH DIFFERENTIAL/PLATELET
BASO%: 0.4 % (ref 0.0–2.0)
BASOS ABS: 0 10*3/uL (ref 0.0–0.1)
EOS%: 0.4 % (ref 0.0–7.0)
Eosinophils Absolute: 0 10*3/uL (ref 0.0–0.5)
HEMATOCRIT: 27.4 % — AB (ref 34.8–46.6)
HGB: 8.6 g/dL — ABNORMAL LOW (ref 11.6–15.9)
LYMPH%: 3.2 % — AB (ref 14.0–49.7)
MCH: 30.2 pg (ref 25.1–34.0)
MCHC: 31.4 g/dL — AB (ref 31.5–36.0)
MCV: 96.1 fL (ref 79.5–101.0)
MONO#: 0.6 10*3/uL (ref 0.1–0.9)
MONO%: 22 % — ABNORMAL HIGH (ref 0.0–14.0)
NEUT#: 2.1 10*3/uL (ref 1.5–6.5)
NEUT%: 74 % (ref 38.4–76.8)
Platelets: 67 10*3/uL — ABNORMAL LOW (ref 145–400)
RBC: 2.85 10*6/uL — ABNORMAL LOW (ref 3.70–5.45)
RDW: 19.1 % — ABNORMAL HIGH (ref 11.2–14.5)
WBC: 2.8 10*3/uL — ABNORMAL LOW (ref 3.9–10.3)
lymph#: 0.1 10*3/uL — ABNORMAL LOW (ref 0.9–3.3)
nRBC: 1 % — ABNORMAL HIGH (ref 0–0)

## 2016-05-21 MED ORDER — SUCRALFATE 1 G PO TABS: 1.0000 g | ORAL_TABLET | Freq: Three times a day (TID) | ORAL | Status: DC

## 2016-05-21 NOTE — Progress Notes (Signed)
Patient came to nursing  After rad tx to right rib, c/o after swallowing feels it get stuck bottom of sternum, she does take protonix, but takes it after she eats, Dr. Lisbeth Renshaw is out of the office and our PA is with another patient , saw patient has chemotherapy now, will call her or come and talk with her after i speak with Alison,PA, to se if she needs carafet also,patient gave verbal understanding, 2 week f/u appt given 8:58 AM

## 2016-05-21 NOTE — Progress Notes (Signed)
[place] [phone]   [phone]  OFFICE PROGRESS NOTE  Darlene Nakayama, MD [address], [address] [place] [address]  DIAGNOSIS: Stage IIA IgA kappa multiple myeloma diagnosed in 2006   PRIOR THERAPY:  1) status post treatment with thalidomide and Decadron followed by autologous peripheral blood stem cell transplant with high-dose melphalan on 04/26/2006 at [facility].  2) the patient had evidence for disease recurrence in August of 2008 and she was treated with 6 cycles of Velcade completed on 03/30/2008 with response to the treatment but was discontinued secondary to neuropathy.  3) maintenance treatment with Revlimid 10 mg by mouth daily by the does was later increase to 15 mg by mouth daily secondary to biochemical recurrence with stabilization of her disease. The treatment with Revlimid 15 mg by mouth daily was discontinued secondary to evidence for disease progression. 4) Revlimid 25 mg by mouth daily for 3 weeks every 4 weeks in addition to Decadron currently at 20 mg by mouth on a weekly basis. She is status post 12 cycles. This was discontinued secondary to disease progression.   CURRENT THERAPY:  1) systemic chemotherapy with Daratumumab 16 MG/KG weekly for the first 9 weeks, Velcade 1.3 MG/M2 subcutaneously on days 1, 4, 8 and 11 every 3 weeks in addition to Decadron 20 mg by mouth on days 1, 2, 4, 5, 8, 9, 11 and 12 every 3 weeks. First dose 05/07/2016 2) Zometa 4 mg IV every 3 months. First dose in 05/05/2015  ADVANCED DIRECTIVE: She does not have advanced directive and was given information.  INTERVAL HISTORY: Darlene Howard 65 y.o. female returns to the clinic today for routine followup visit accompanied by her nephew. The patient started treatment with Velcade and daratumumab on 05/07/2016. Treatment was held last week due to thrombocytopenia. She continues on radiation to her right chest wall. The patient denied having  any fever or chills. She denied having any shortness of breath, cough or hemoptysis. She has no weight loss or night sweats. She has loose stools about 3 times per day and is using Imodium up to 4 tablets per day. Denies bleeding. She is here for for review of blood work.  MEDICAL HISTORY: Past Medical History  Diagnosis Date  . Glaucoma     uses eye drops as instructed  . Personal history of colon cancer     ASCENDING COLON--  S/P  RIGHT HEMICOLECTOMY WITH NEGATIVE NODES/    NO RECURRENCE  . Herpes     takes Valtrex as needed  . GERD (gastroesophageal reflux disease)     takes Pantoprazole daily  . Hypertension     takes Amlodipine and Diovan daily  . Bruises easily     d/t being on COumadin  . History of colon polyps   . Type 2 diabetes mellitus ([place])   . 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 H/O stem cell transplant ([place])     04-26-2006  AT [facility]  . Multiple myeloma DX  2006  STATE IIA,  IgA  KAPPA    STABLE  PER DR MOHAMED--  S/P STEM CELL TRANSPLANT 2007 /  RECURRENCE 2008  CHEMO ENDED 03-30-2008/   NOW ON MAINTANANCE REVLIMID )  . Vocal cord nodule     resolved- see Dr. Deeann Saint notes in media tab 06/17/14  . DDD (degenerative disc disease), lumbar   . Cholelithiasis   .  Fatty liver   . Lung cancer ([place])     chest wall lesion aadn 6th rib    ALLERGIES:  is allergic to codeine and tramadol.  MEDICATIONS:  Current Outpatient Prescriptions  Medication Sig Dispense Refill  . acetaminophen (TYLENOL) 500 MG tablet Take 500 mg by mouth daily as needed for moderate pain.    Marland Kitchen amLODipine (NORVASC) 2.5 MG tablet Take 1 tablet by mouth  daily in the morning 90 tablet 0  . aspirin (ASPIRIN LOW DOSE) 81 MG EC tablet Take 81 mg by mouth daily.      . calcium-vitamin D (OSCAL 500/200 D-3) 500-200 MG-UNIT per tablet Take 1 tablet by mouth 3 (three) times daily.      . cholecalciferol (VITAMIN D) 1000 UNITS tablet Take 1,000  Units by mouth daily. Reported on 04/24/2016    . dexamethasone (DECADRON) 4 MG tablet 5 tablets by mouth daily on days 1, 2, 4, 5, 8, 9, 11 and 12 every 3 weeks starting with the first dose of chemotherapy on 05/07/2016 10 tablet 1  . dorzolamide-timolol (COSOPT) 22.3-6.8 MG/ML ophthalmic solution Place 1 drop into the left eye 2 (two) times daily.     Marland Kitchen gabapentin (NEURONTIN) 100 MG capsule Take 1 capsule by mouth 3  times daily 270 capsule 1  . hyaluronate sodium (RADIAPLEXRX) GEL Apply 1 application topically 2 (two) times daily.    Marland Kitchen loperamide (IMODIUM A-D) 2 MG tablet Take 2 mg by mouth daily as needed for diarrhea or loose stools.    Marland Kitchen loratadine (CLARITIN) 10 MG tablet TAKE ONE TABLET BY MOUTH ONCE DAILY. (Patient taking differently: TAKE 10 MG BY MOUTH ONCE DAILY.) 30 tablet 3  . LUMIGAN 0.01 % SOLN Place 1 drop into both eyes at bedtime.     . metFORMIN (GLUCOPHAGE) 500 MG tablet Take 1 tablet (500 mg total) by mouth daily with breakfast. 90 tablet 3  . Multiple Vitamins-Minerals (CENTRUM SILVER PO) Take 1 tablet by mouth daily.     . ondansetron (ZOFRAN ODT) 4 MG disintegrating tablet Take 1 tablet (4 mg total) by mouth every 8 (eight) hours as needed for nausea or vomiting. 20 tablet 0  . ondansetron (ZOFRAN) 4 MG tablet Take 1 tablet (4 mg total) by mouth every 6 (six) hours. 10 tablet 0  . ONE TOUCH ULTRA TEST test strip USE AS DIRECTED TO CHECK DAILY. 50 each 3  . oxyCODONE (ROXICODONE) 5 MG immediate release tablet Take 1 tablet (5 mg total) by mouth every 4 (four) hours as needed for severe pain. (Patient not taking: Reported on 05/11/2016) 30 tablet 0  . oxyCODONE-acetaminophen (PERCOCET/ROXICET) 5-325 MG tablet Take 1 tablet by mouth every 8 (eight) hours as needed for severe pain. 40 tablet 0  . pantoprazole (PROTONIX) 40 MG tablet TAKE 1 TABLET BY MOUTH  DAILY FOR ACID REFLUX 90 tablet 1  . potassium chloride SA (K-DUR,KLOR-CON) 20 MEQ tablet Take 1 tablet by mouth  twice a day  (Patient taking differently: Take 20 meq by mouth twice daily) 180 tablet 0  . PREMARIN vaginal cream PLACE 1 APPLICATORFUL VAGINALLY TWICE A WEEK. 30 g 0  . valACYclovir (VALTREX) 1000 MG tablet Take 1 tablet by mouth  daily as needed (Patient taking differently: Take 1 tablet by mouth  daily as needed for outbreaks) 90 tablet 1  . valsartan-hydrochlorothiazide (DIOVAN-HCT) 320-25 MG tablet Take 1 tablet by mouth  daily 90 tablet 0   No current facility-administered medications for this visit.  SURGICAL HISTORY:  Past Surgical History  Procedure Laterality Date  . Colonoscopy  10/07/2006    EXH:BZJIRC rectum/Status post right hemicolectomy. Normal residual colon  . Cervical cone biopsy    . Cataract extraction w/phaco Left 01/20/2014    Procedure: CATARACT EXTRACTION PHACO AND INTRAOCULAR LENS PLACEMENT (IOC);  Surgeon: Adonis Brook, MD;  Location: [facility];  Service: Ophthalmology;  Laterality: Left;  . 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 Co2  ablation vulva perianal and vaginal dysplatic areas  02-15-2010  . Abdominal hysterectomy  1983    w/  appendectomy and left salpingoophorectom  . Hemicolectomy Right 04-27-2005    CARCINOMA   ASCENDING COLON  . Vulvectomy N/A 05/06/2014    Procedure: WIDE LOCAL EXCISION LEFT LABIA MAJORA;  Surgeon: Janie Morning, MD;  Location: [facility];  Service: Gynecology;  Laterality: N/A;  . Vulvar lesion removal N/A 05/06/2014    Procedure: LASER OF VULVAR LESION;  Surgeon: Janie Morning, MD;  Location: [facility];  Service: Gynecology;  Laterality: N/A;  . Lesion removal N/A 05/06/2014    Procedure: LASER OF THE VAGINA;  Surgeon: Janie Morning, MD;  Location: [facility];  Service: Gynecology;  Laterality: N/A;  . Esophagogastroduodenoscopy  04/02/2005    VEL:FYBOFB/PZWCHENIDPOE polyp with central erosion versus ulceration in the body of the stomach, resected and recovered Remainder of the  gastric mucosa, D1, D2 appeared normal  . Colonoscopy  10/2009    RMR: diminutive RS polyp (hyperplastic).   . Colonoscopy N/A 12/15/2014    Procedure: COLONOSCOPY;  Surgeon: Daneil Dolin, MD;  [facility];  Service: Endoscopy;  Laterality: N/A;  130   . Cholecystectomy N/A 02/28/2016    Procedure: LAPAROSCOPIC CHOLECYSTECTOMY;  Surgeon: Ralene Ok, MD;  Location: [facility];  Service: General;  Laterality: N/A;    REVIEW OF SYSTEMS:  Constitutional: positive for fatigue Eyes: negative Ears, nose, mouth, throat, and face: negative Respiratory: positive for pleurisy/chest pain Cardiovascular: negative Gastrointestinal: negative Genitourinary:negative Integument/breast: negative Hematologic/lymphatic: negative Musculoskeletal:negative Neurological: negative Behavioral/Psych: negative Endocrine: negative Allergic/Immunologic: negative   PHYSICAL EXAMINATION: General appearance: alert, cooperative and no distress Head: Normocephalic, without obvious abnormality, atraumatic Neck: no adenopathy Lymph nodes: Cervical, supraclavicular, and axillary nodes normal. Resp: clear to auscultation bilaterally Back: symmetric, no curvature. ROM normal. No CVA tenderness. Cardio: regular rate and rhythm, S1, S2 normal, no murmur, click, rub or gallop GI: soft, non-tender; bowel sounds normal; no masses,  no organomegaly Extremities: extremities normal, atraumatic, no cyanosis or edema Neurologic: Alert and oriented X 3, normal strength and tone. Normal symmetric reflexes. Normal coordination and gait  ECOG PERFORMANCE STATUS: 1 - Symptomatic but completely ambulatory  There were no vitals taken for this visit.  LABORATORY DATA: Lab Results  Component Value Date   WBC 2.8* 05/21/2016   HGB 8.6* 05/21/2016   HCT 27.4* 05/21/2016   MCV 96.1 05/21/2016   PLT 67* 05/21/2016      Chemistry      Component Value Date/Time   NA 138 05/19/2016 0823   NA 139 05/14/2016 0805   K  3.7 05/19/2016 0823   K 4.3 05/14/2016 0805   CL 106 05/19/2016 0823   CL 105 06/17/2013 1441   CO2 21 05/19/2016 0823   CO2 23 05/14/2016 0805   BUN 12 05/19/2016 0823   BUN 12.3 05/14/2016 0805   CREATININE 0.77 05/19/2016 0823   CREATININE 0.9 05/14/2016 0805   CREATININE 0.93 04/24/2016 2003  Component Value Date/Time   CALCIUM 8.8 05/19/2016 0823   CALCIUM 9.1 05/14/2016 0805   ALKPHOS 63 05/14/2016 0805   ALKPHOS 58 04/24/2016 2003   AST 12 05/14/2016 0805   AST 14* 04/24/2016 2003   ALT 23 05/14/2016 0805   ALT 14 04/24/2016 2003   BILITOT 1.07 05/14/2016 0805   BILITOT 1.3* 04/24/2016 2003       RADIOGRAPHIC STUDIES: Dg Chest 2 View  04/24/2016  CLINICAL DATA:  Right flank pain EXAM: CHEST  2 VIEW COMPARISON:  12/05/2013 FINDINGS: Rounded 5.6 cm mass over the right chest which is newly seen, peripheral or subpleural. There is suggestion of a 1 cm left lung nodule. Appearance concerning for malignancy/metastatic disease. No associated air bronchograms. Normal heart size and mediastinal contours. No acute osseous finding. IMPRESSION: 6 cm mass over the right chest. Possible 1 cm left lung nodule. Recommend chest CT. Electronically Signed   By: Monte Fantasia M.D.   On: 04/24/2016 23:15   Ct Chest W Contrast  04/25/2016  CLINICAL DATA:  Right-sided chest pain. EXAM: CT CHEST WITH CONTRAST TECHNIQUE: Multidetector CT imaging of the chest was performed during intravenous contrast administration. CONTRAST:  60m ISOVUE-300 IOPAMIDOL (ISOVUE-300) INJECTION 61% COMPARISON:  None. FINDINGS: THORACIC INLET/BODY WALL: No acute abnormality. MEDIASTINUM: Mediastinal adenopathy with 13 mm subcarinal node and rounded esophageal hiatus node measuring 15 mm. Bilateral posterior mediastinal nodules, greater on the right at the level of the destructive rib mass where a discrete nodule measures up to 3 cm. The rib mass is discrete from these posterior mediastinal nodules. Normal heart size.  Atherosclerosis, including the coronary arteries. No acute vascular finding LUNG WINDOWS: Mild dependent atelectasis.  No primary mass lesion is seen. 5 mm right upper lobe pulmonary nodule. Trace pleural effusions. UPPER ABDOMEN: No acute findings. OSSEOUS: Large posterior right sixth rib mass with extraosseous subpleural growth accounting for the chest x-ray abnormality. The mass measures up to 52 mm in maximal axial dimension. There are are occasional tiny cortical lucencies which are likely myelomatous. Approximately 1 cm lytic lesion in the lateral margin right scapula. Marked osteopenia IMPRESSION: 1. The right chest mass is a large destructive sixth rib lesion. This and other lytic bone lesions are presumably related to patient's history of myeloma. 2. Posterior mediastinal nodules and mediastinal adenopathy could be myelomatous, lymphoma or less likely metastatic disease not excluded. 3. 5 mm upper lobe pulmonary nodule, attention on surveillance imaging. Electronically Signed   By: JMonte FantasiaM.D.   On: 04/25/2016 01:22   Dg Bone Survey Met  05/01/2016  CLINICAL DATA:  Multiple myeloma. EXAM: METASTATIC BONE SURVEY COMPARISON:  04/24/2016 .  03/28/2012. FINDINGS: 6 cm mass lesion noted projected right upper chest with destruction of the right posterior sixth rib. Progressive nodular density noted projected over the left mid chest. Lytic lesions are noted in the skull. Lytic lesion in the right scapula noted. Lytic lesion in the distal right humerus noted. Lytic lesion in the right radius. Lytic lesion noted mid left radius. Mild new compression L2. Stable anterolisthesis L4 on L5. Diffuse osteopenia is present. Peripheral vascular calcification present. IMPRESSION: 1. 6 cm expansile mass involving the right posterior seventh rib. 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 Metastatic disease cannot be excluded . Progression of ill-defined pulmonary versus rib lesion noted  over the left mid chest. 2. Widespread bony lucencies consistent with myeloma, particularly prominent within the skull. 3. Mild compression L2. Electronically Signed   By: TMarcello Moores  Register   On: 05/01/2016 12:12   Ct Renal Stone Study  04/24/2016  CLINICAL DATA:  Acute onset of upper abdominal pain, radiating to the right and about the back. Initial encounter. EXAM: CT ABDOMEN AND PELVIS WITHOUT CONTRAST TECHNIQUE: Multidetector CT imaging of the abdomen and pelvis was performed following the standard protocol without IV contrast. COMPARISON:  CT of the chest, abdomen and pelvis from 06/02/2010, and MRI of the lumbar spine performed 03/01/2014 FINDINGS: Trace right-sided pleural fluid is noted. Minimal bibasilar atelectasis or scarring is seen. The liver and spleen are unremarkable in appearance. The patient is status post cholecystectomy. The pancreas and adrenal glands are unremarkable. Mild nonspecific perinephric stranding is noted bilaterally. There is no evidence of hydronephrosis. No renal or ureteral stones are identified. No free fluid is identified. The small bowel is unremarkable in appearance. The stomach is within normal limits. No acute vascular abnormalities are seen. A small umbilical hernia is seen, containing only fat. The patient is status post partial colectomy, with an ileocolic anastomosis at the proximal transverse colon. The anastomosis is unremarkable in appearance. The remaining colon is unremarkable in appearance. The bladder is decompressed and not well assessed. The patient is status post hysterectomy. No suspicious adnexal masses are seen. No inguinal lymphadenopathy is seen. No acute osseous abnormalities are identified. There is grade 1 anterolisthesis of L4 on L5, with underlying facet disease. There is mild chronic loss of height at the superior endplate of L2. Lateral osteophytes are noted along the lower thoracic spine. IMPRESSION: 1. No acute abnormality seen to explain the  patient's symptoms. 2. Trace right-sided pleural fluid noted. Minimal bibasilar atelectasis or scarring seen. 3. Small umbilical hernia, containing only fat. 4. Ileocolic anastomosis is unremarkable in appearance. 5. Mild degenerative change along the lumbar spine. Electronically Signed   By: Garald Balding M.D.   On: 04/24/2016 23:37    ASSESSMENT AND PLAN: This is a very pleasant 65 year old African American female with history of multiple myeloma currently on treatment with Revlimid and low-dose Decadron and tolerated her treatment fairly well. The patient is doing fine today with no specific complaints except for mild fatigue.  Because of disease progression, I recommended for her to continue her treatment with Revlimid and Decadron but I increased the dose of Revlimid to 25 mg by mouth daily for 21 days every 4 weeks, status post 12 months of this regimen. The recent myeloma panel performed at [facility] showed evidence for disease progression. The patient also has enlarging right-sided chest wall lesion. Repeat skeletal bone survey was overall stable.  The patient was seen and examined with Dr. [name]. Today will be her off week for the Daratumumab, Velcade and Decadron. Platelet count remains low, but is improving. Discussed with the patient and her nephew that she will return next week to begin cycle 2 of her chemotherapy.  Continue radiation to the right chest wall for palliation of her pain.  The patient will continue her treatment with Zometa every 3 months as a scheduled. Last dose was given on 04/27/2016.  Discussed use of Imodium with the patient for control of her diarrhea. She will contact us if the Imodium was not controlling her symptoms.  She will come back for follow-up visit in 2 weeks for evaluation and management of any adverse effect of her treatment.  She was advised to call immediately if she has any concerning symptoms and interval.  The patient voices  understanding of current disease status and treatment  options and is in agreement with the current care plan.  All questions were answered. The patient knows to call the clinic with any problems, questions or concerns. We can certainly see the patient much sooner if necessary.   Mikey Bussing, DNP, AGPCNP-BC, AOCNP  ADDENDUM: Hematology/Oncology Attending: I had a face to face encounter with the patient today. I recommended her care plan. This is a very pleasant 65 years old African-American female with relapsed multiple myeloma and currently on treatment with Daratumumab, Velcade and Decadron. She status post 1 cycle. She missed her treatment last week secondary to significant thrombocytopenia. She is off this week of treatment. I recommended for the patient to resume her treatment with day 1 of cycle #2 neck week. She will resume her treatment with Daratumumab, Decadron and Velcade next week. She would come back for follow-up visit in 2 weeks for reevaluation and management of any adverse effect of her treatment.  Disclaimer: This note was dictated with voice recognition software. Similar sounding words can inadvertently be transcribed and may be missed upon review. Eilleen Kempf., MD 05/21/2016

## 2016-05-21 NOTE — Telephone Encounter (Signed)
Gave and printed appt sched and avs for pt for May and June  °

## 2016-05-22 ENCOUNTER — Telehealth: Payer: Self-pay | Admitting: Oncology

## 2016-05-22 ENCOUNTER — Ambulatory Visit
Admission: RE | Admit: 2016-05-22 | Discharge: 2016-05-22 | Disposition: A | Discharge status: Home / Self Care | Payer: Medicare Other | Source: Ambulatory Visit | Attending: Radiation Oncology | Admitting: Radiation Oncology

## 2016-05-22 DIAGNOSIS — H409 Unspecified glaucoma: Secondary | ICD-10-CM | POA: Diagnosis not present

## 2016-05-22 DIAGNOSIS — E1142 Type 2 diabetes mellitus with diabetic polyneuropathy: Secondary | ICD-10-CM | POA: Diagnosis not present

## 2016-05-22 DIAGNOSIS — C7951 Secondary malignant neoplasm of bone: Secondary | ICD-10-CM | POA: Diagnosis not present

## 2016-05-22 DIAGNOSIS — C9000 Multiple myeloma not having achieved remission: Secondary | ICD-10-CM | POA: Diagnosis not present

## 2016-05-22 DIAGNOSIS — Z85038 Personal history of other malignant neoplasm of large intestine: Secondary | ICD-10-CM | POA: Diagnosis not present

## 2016-05-22 DIAGNOSIS — Z510 Encounter for antineoplastic radiation therapy: Secondary | ICD-10-CM | POA: Diagnosis not present

## 2016-05-22 NOTE — Telephone Encounter (Signed)
Pt came in to change 6/8 apt time

## 2016-05-23 ENCOUNTER — Ambulatory Visit (INDEPENDENT_AMBULATORY_CARE_PROVIDER_SITE_OTHER): Payer: Medicare Other | Admitting: Family Medicine

## 2016-05-23 ENCOUNTER — Encounter: Payer: Self-pay | Admitting: Family Medicine

## 2016-05-23 ENCOUNTER — Encounter: Payer: Self-pay | Admitting: Radiation Oncology

## 2016-05-23 ENCOUNTER — Ambulatory Visit
Admission: RE | Admit: 2016-05-23 | Discharge: 2016-05-23 | Disposition: A | Discharge status: Home / Self Care | Payer: Medicare Other | Source: Ambulatory Visit | Attending: Radiation Oncology | Admitting: Radiation Oncology

## 2016-05-23 VITALS — BP 106/64 | HR 100 | Resp 18 | Ht 66.0 in | Wt 181.0 lb

## 2016-05-23 VITALS — BP 113/59 | HR 102 | Temp 97.9°F | Resp 18 | Ht 66.0 in | Wt 181.2 lb

## 2016-05-23 DIAGNOSIS — G4700 Insomnia, unspecified: Secondary | ICD-10-CM | POA: Diagnosis not present

## 2016-05-23 DIAGNOSIS — C9000 Multiple myeloma not having achieved remission: Secondary | ICD-10-CM

## 2016-05-23 DIAGNOSIS — H409 Unspecified glaucoma: Secondary | ICD-10-CM

## 2016-05-23 DIAGNOSIS — C7951 Secondary malignant neoplasm of bone: Secondary | ICD-10-CM | POA: Diagnosis not present

## 2016-05-23 DIAGNOSIS — I10 Essential (primary) hypertension: Secondary | ICD-10-CM | POA: Diagnosis not present

## 2016-05-23 DIAGNOSIS — Z510 Encounter for antineoplastic radiation therapy: Secondary | ICD-10-CM | POA: Diagnosis not present

## 2016-05-23 DIAGNOSIS — E1142 Type 2 diabetes mellitus with diabetic polyneuropathy: Secondary | ICD-10-CM | POA: Diagnosis not present

## 2016-05-23 DIAGNOSIS — Z85038 Personal history of other malignant neoplasm of large intestine: Secondary | ICD-10-CM | POA: Diagnosis not present

## 2016-05-23 DIAGNOSIS — R195 Other fecal abnormalities: Secondary | ICD-10-CM

## 2016-05-23 DIAGNOSIS — E669 Obesity, unspecified: Secondary | ICD-10-CM

## 2016-05-23 DIAGNOSIS — E1169 Type 2 diabetes mellitus with other specified complication: Secondary | ICD-10-CM

## 2016-05-23 DIAGNOSIS — E119 Type 2 diabetes mellitus without complications: Secondary | ICD-10-CM

## 2016-05-23 MED ORDER — MAGIC MOUTHWASH W/LIDOCAINE: 5.0000 mL | Freq: Four times a day (QID) | ORAL | Status: DC | PRN

## 2016-05-23 MED ORDER — RADIAPLEXRX EX GEL
Freq: Once | CUTANEOUS | Status: AC
  Administered 2016-05-23: 12:00:00 via TOPICAL

## 2016-05-23 MED ORDER — TEMAZEPAM 15 MG PO CAPS: 15.0000 mg | ORAL_CAPSULE | Freq: Every evening | ORAL | Status: DC | PRN

## 2016-05-23 NOTE — Progress Notes (Signed)
Ms. Dobratz has received 10 fractions to her right rib.  Skin to right rib slight tanning.  Using Radiaplex gel bid.  Appetite is not good, having swallowing problems with pain 9/10 taking Tylenol.  Started Carafate 1 GM gid 05-21-16 has not helped very much and taking Protonix.. Having fatigue during the day.  Denies SOB, coughing some nonproductive. New Radiaplex gel given today.  EOT education done. One month FU card given to see Dr. Lisbeth Renshaw. Wt Readings from Last 3 Encounters:  05/23/16 181 lb 3.2 oz (82.192 kg)  05/21/16 184 lb 4.8 oz (83.598 kg)  05/18/16 185 lb 8 oz (84.142 kg)  BP 113/59 mmHg  Pulse 102  Temp(Src) 97.9 F (36.6 C) (Oral)  Resp 18  Ht '5\' 6"'$  (1.676 m)  Wt 181 lb 3.2 oz (82.192 kg)  BMI 29.26 kg/m2  SpO2 100%

## 2016-05-23 NOTE — Progress Notes (Signed)
Subjective:    Patient ID: Darlene Howard, female    DOB: Apr 20, 1951, 65 y.o.   MRN: [id number]  HPI   Darlene Howard     MRN: [id number]      DOB: June 07, 1951   HPI Darlene Howard is here for follow up and re-evaluation of chronic medical conditions, medication management and review of any available recent lab and radiology data.  Preventive health is updated, specifically  Cancer screening and Immunization.   Questions or concerns regarding consultations or procedures which the PT has had in the interim are  Addressed.Just completed radiation and awaiting chemo when platelets rise. The PT denies any adverse reactions to current medications since the last visit.  C/o insomnia   ROS Denies recent fever or chills. Denies sinus pressure, nasal congestion, ear pain or sore throat. Denies chest congestion, productive cough or wheezing. Denies chest pains, palpitations and leg swelling Denies abdominal pain, nausea, vomiting, or constipation.  C/o excessive loose stool Denies dysuria, frequency, hesitancy or incontinence. Denies uncontrolled  joint pain, swelling and limitation in mobility. Denies headaches, seizures, numbness, or tingling. Denies depression,c/o increased anxiety and mild  insomnia. Denies skin break down or rash.   PE  BP 106/64 mmHg  Pulse 100  Resp 18  Ht _0  (1.676 m)  Wt 181 lb (82.101 kg)  BMI 29.23 kg/m2  SpO2 99%  Patient alert and oriented and in no cardiopulmonary distress.  HEENT: No facial asymmetry, EOMI,   oropharynx pink and moist.  Neck adequate ROM no JVD, no mass.  Chest: Clear to auscultation bilaterally.  CVS: S1, S2 no murmurs, no S3.Regular rate.  ABD: Soft non tender.   Ext: No edema  MS: Adequate though reduced  ROM spine, shoulders, hips and knees.  Skin: Intact, no ulcerations or rash noted.  Psych: Good eye contact, normal affect. Memory intact anxious not  depressed appearing.  CNS: CN 2-12 intact, power,  normal  throughout.no focal deficits noted.   Assessment & Plan   Essential hypertension DASH diet and commitment to daily physical activity for a minimum of 30 minutes discussed and encouraged, as a part of hypertension management. The importance of attaining a healthy weight is also discussed.  BP/Weight 05/23/2016 05/23/2016 05/21/2016 05/18/2016 05/14/2016 05/11/2016 05/02/5464  Systolic BP 681 275 170 017 494 496 759  Diastolic BP 59 64 55 58 65 53 60  Wt. (Lbs) 181.2 181 184.3 185.5 186.9 189.8 -  BMI 29.26 29.23 29.76 29.95 30.18 30.65 -   Over corrected , so amlodipine discontinued     Diabetes mellitus type 2 in obese (HCC) D/c metformin Darlene Howard is reminded of the importance of commitment to daily physical activity for 30 minutes or more, as able and the need to limit carbohydrate intake to 30 to 60 grams per meal to help with blood sugar control.   The need to take medication as prescribed, test blood sugar as directed, and to call between visits if there is a concern that blood sugar is uncontrolled is also discussed.   Darlene Howard is reminded of the importance of daily foot exam, annual eye examination, and good blood sugar, blood pressure and cholesterol control.  Diabetic Labs Latest Ref Rng 05/21/2016 05/19/2016 05/14/2016 05/07/2016 04/24/2016  HbA1c <5.7 % - 5.4 - - -  Microalbumin <2.0 mg/dL - - - - -  Micro/Creat Ratio 0.0 - 30.0 mg/g - - - - -  Chol 125 - 200 mg/dL - - - - -  HDL >=46 mg/dL - - - - -  Calc LDL <130 mg/dL - - - - -  Triglycerides <150 mg/dL - - - - -  Creatinine 0.6 - 1.1 mg/dL 0.8 0.77 0.9 0.9 0.93   BP/Weight 05/23/2016 05/23/2016 05/21/2016 05/18/2016 05/14/2016 05/11/2016 01/30/8656  Systolic BP 846 962 952 841 324 401 027  Diastolic BP 59 64 55 58 65 53 60  Wt. (Lbs) 181.2 181 184.3 185.5 186.9 189.8 -  BMI 29.26 29.23 29.76 29.95 30.18 30.65 -   Foot/eye exam completion dates Latest Ref Rng 12/27/2015 07/21/2015  Eye Exam No Retinopathy No Retinopathy -    Foot exam Order - - -  Foot Form Completion - - Done         Loose stools Excessive loos stool , advised d/c metformin, she is to call back if persists for GI eval  Glaucoma Followed closely by opthalmology and under control  Multiple myeloma ([place]) Just completed radiation treatment today, recurrence is in posterior right rib,  Chemo on hold as platelet count is low, anxious to start treatment  Insomnia Sleep hygiene reviewed and written information offered also. Prescription sent for  medication needed.       Review of Systems     Objective:   Physical Exam        Assessment & Plan:

## 2016-05-23 NOTE — Patient Instructions (Addendum)
F/u in 3 month, call if you need me before  STOP metformin and amlodipine, no other med changes  New for sleep when needed is restoril  If stomach burning and /or loose stool continues or worsens, call for referral to Dr Sydell Axon  Platelets WILL rise, thankful that you "rang the bell " on radiation treatments today!  HBA1C, cmp and eGFR and microalb in 3 month

## 2016-05-23 NOTE — Progress Notes (Signed)
Department of Radiation Oncology  Phone:  [phone] Fax:        [phone]  Weekly Treatment Note    Name: Darlene Howard Date: 05/23/2016 MRN: [id number] DOB: [birth year]   Current dose: 30 Gy  Current fraction: 10   MEDICATIONS: Current Outpatient Prescriptions  Medication Sig Dispense Refill  . acetaminophen (TYLENOL) 500 MG tablet Take 500 mg by mouth daily as needed for moderate pain.    Marland Kitchen amLODipine (NORVASC) 2.5 MG tablet Take 1 tablet by mouth  daily in the morning 90 tablet 0  . aspirin (ASPIRIN LOW DOSE) 81 MG EC tablet Take 81 mg by mouth daily.      . calcium-vitamin D (OSCAL 500/200 D-3) 500-200 MG-UNIT per tablet Take 1 tablet by mouth 3 (three) times daily.      . cholecalciferol (VITAMIN D) 1000 UNITS tablet Take 1,000 Units by mouth daily. Reported on 04/24/2016    . dexamethasone (DECADRON) 4 MG tablet 5 tablets by mouth daily on days 1, 2, 4, 5, 8, 9, 11 and 12 every 3 weeks starting with the first dose of chemotherapy on 05/07/2016 10 tablet 1  . dorzolamide-timolol (COSOPT) 22.3-6.8 MG/ML ophthalmic solution Place 1 drop into the left eye 2 (two) times daily.     Marland Kitchen gabapentin (NEURONTIN) 100 MG capsule Take 1 capsule by mouth 3  times daily 270 capsule 1  . hyaluronate sodium (RADIAPLEXRX) GEL Apply 1 application topically 2 (two) times daily.    Marland Kitchen loperamide (IMODIUM A-D) 2 MG tablet Take 2 mg by mouth daily as needed for diarrhea or loose stools.    Marland Kitchen loratadine (CLARITIN) 10 MG tablet TAKE ONE TABLET BY MOUTH ONCE DAILY. (Patient taking differently: TAKE 10 MG BY MOUTH ONCE DAILY.) 30 tablet 3  . LUMIGAN 0.01 % SOLN Place 1 drop into both eyes at bedtime.     . metFORMIN (GLUCOPHAGE) 500 MG tablet Take 1 tablet (500 mg total) by mouth daily with breakfast. 90 tablet 3  . Multiple Vitamins-Minerals (CENTRUM SILVER PO) Take 1 tablet by mouth daily.     . ondansetron (ZOFRAN ODT) 4 MG disintegrating tablet Take 1 tablet (4 mg total) by mouth every 8 (eight)  hours as needed for nausea or vomiting. 20 tablet 0  . ondansetron (ZOFRAN) 4 MG tablet Take 1 tablet (4 mg total) by mouth every 6 (six) hours. 10 tablet 0  . ONE TOUCH ULTRA TEST test strip USE AS DIRECTED TO CHECK DAILY. 50 each 3  . oxyCODONE (ROXICODONE) 5 MG immediate release tablet Take 1 tablet (5 mg total) by mouth every 4 (four) hours as needed for severe pain. 30 tablet 0  . pantoprazole (PROTONIX) 40 MG tablet TAKE 1 TABLET BY MOUTH  DAILY FOR ACID REFLUX 90 tablet 1  . potassium chloride SA (K-DUR,KLOR-CON) 20 MEQ tablet Take 1 tablet by mouth  twice a day (Patient taking differently: Take 20 meq by mouth twice daily) 180 tablet 0  . PREMARIN vaginal cream PLACE 1 APPLICATORFUL VAGINALLY TWICE A WEEK. 30 g 0  . sucralfate (CARAFATE) 1 g tablet Take 1 tablet (1 g total) by mouth 4 (four) times daily -  with meals and at bedtime. Mix in 2 teaspoons of water to make a slurry and take 5-30 minutes before meals and hs 120 tablet 0  . valACYclovir (VALTREX) 1000 MG tablet Take 1 tablet by mouth  daily as needed (Patient taking differently: Take 1 tablet by mouth  daily as needed for outbreaks) 90  tablet 1  . valsartan-hydrochlorothiazide (DIOVAN-HCT) 320-25 MG tablet Take 1 tablet by mouth  daily 90 tablet 0  . warfarin (COUMADIN) 2 MG tablet Take 2 mg by mouth daily.    Marland Kitchen oxyCODONE-acetaminophen (PERCOCET/ROXICET) 5-325 MG tablet Take 1 tablet by mouth every 8 (eight) hours as needed for severe pain. (Patient not taking: Reported on 05/23/2016) 40 tablet 0   No current facility-administered medications for this encounter.     ALLERGIES: Codeine and Tramadol   LABORATORY DATA:  Lab Results  Component Value Date   WBC 2.8* 05/21/2016   HGB 8.6* 05/21/2016   HCT 27.4* 05/21/2016   MCV 96.1 05/21/2016   PLT 67* 05/21/2016   Lab Results  Component Value Date   NA 139 05/21/2016   K 3.7 05/21/2016   CL 106 05/19/2016   CO2 21* 05/21/2016   Lab Results  Component Value Date    ALT 16 05/21/2016   AST 11 05/21/2016   ALKPHOS 60 05/21/2016   BILITOT 0.85 05/21/2016     NARRATIVE: Darlene Howard was seen today for weekly treatment management. The chart was checked and the patient's films were reviewed.  [name] has received 10 fractions to her right rib.  Skin to right rib slight tanning.  Using Radiaplex gel bid.  Appetite is not good, having swallowing problems with pain 9/10 taking Tylenol.  Started Carafate 1 GM gid 05-21-16 has not helped very much and taking Protonix.. Having fatigue during the day.  Denies SOB, coughing some nonproductive. New Radiaplex gel given today.  EOT education done. One month FU card given to see Dr. Lisbeth Howard. Wt Readings from Last 3 Encounters:  05/23/16 181 lb 3.2 oz (82.192 kg)  05/21/16 184 lb 4.8 oz (83.598 kg)  05/18/16 185 lb 8 oz (84.142 kg)  BP 113/59 mmHg  Pulse 102  Temp(Src) 97.9 F (36.6 C) (Oral)  Resp 18  Ht '5\' 6"'$  (1.676 m)  Wt 181 lb 3.2 oz (82.192 kg)  BMI 29.26 kg/m2  SpO2 100%  PHYSICAL EXAMINATION: height is '5\' 6"'$  (1.676 m) and weight is 181 lb 3.2 oz (82.192 kg). Her oral temperature is 97.9 F (36.6 C). Her blood pressure is 113/59 and her pulse is 102. Her respiration is 18 and oxygen saturation is 100%.        ASSESSMENT: The patient did satisfactorily with treatment. The patient is having some ongoing significant esophagitis to start taking Prilosec and Carafate. I have called in a prescription for Magic mouthwash with lidocaine.  PLAN: The patient will follow-up in our clinic in 1 month.

## 2016-05-24 ENCOUNTER — Ambulatory Visit: Payer: Medicare Other

## 2016-05-25 ENCOUNTER — Ambulatory Visit: Payer: Medicare Other

## 2016-05-28 NOTE — Assessment & Plan Note (Signed)
Followed closely by opthalmology and under control

## 2016-05-28 NOTE — Assessment & Plan Note (Signed)
Sleep hygiene reviewed and written information offered also. Prescription sent for  medication needed.  

## 2016-05-28 NOTE — Assessment & Plan Note (Signed)
D/c metformin Darlene Howard is reminded of the importance of commitment to daily physical activity for 30 minutes or more, as able and the need to limit carbohydrate intake to 30 to 60 grams per meal to help with blood sugar control.   The need to take medication as prescribed, test blood sugar as directed, and to call between visits if there is a concern that blood sugar is uncontrolled is also discussed.   Darlene Howard is reminded of the importance of daily foot exam, annual eye examination, and good blood sugar, blood pressure and cholesterol control.  Diabetic Labs Latest Ref Rng 05/21/2016 05/19/2016 05/14/2016 05/07/2016 04/24/2016  HbA1c <5.7 % - 5.4 - - -  Microalbumin <2.0 mg/dL - - - - -  Micro/Creat Ratio 0.0 - 30.0 mg/g - - - - -  Chol 125 - 200 mg/dL - - - - -  HDL >=46 mg/dL - - - - -  Calc LDL <130 mg/dL - - - - -  Triglycerides <150 mg/dL - - - - -  Creatinine 0.6 - 1.1 mg/dL 0.8 0.77 0.9 0.9 0.93   BP/Weight 05/23/2016 05/23/2016 05/21/2016 05/18/2016 05/14/2016 05/11/2016 4/49/6759  Systolic BP 163 846 659 935 701 779 390  Diastolic BP 59 64 55 58 65 53 60  Wt. (Lbs) 181.2 181 184.3 185.5 186.9 189.8 -  BMI 29.26 29.23 29.76 29.95 30.18 30.65 -   Foot/eye exam completion dates Latest Ref Rng 12/27/2015 07/21/2015  Eye Exam No Retinopathy No Retinopathy -  Foot exam Order - - -  Foot Form Completion - - Done

## 2016-05-28 NOTE — Assessment & Plan Note (Signed)
Just completed radiation treatment today, recurrence is in posterior right rib,  Chemo on hold as platelet count is low, anxious to start treatment

## 2016-05-28 NOTE — Assessment & Plan Note (Signed)
DASH diet and commitment to daily physical activity for a minimum of 30 minutes discussed and encouraged, as a part of hypertension management. The importance of attaining a healthy weight is also discussed.  BP/Weight 05/23/2016 05/23/2016 05/21/2016 05/18/2016 05/14/2016 05/11/2016 2/70/3500  Systolic BP 938 [phone] 810  Diastolic BP 59 64 55 58 65 53 60  Wt. (Lbs) 181.2 181 184.3 185.5 186.9 189.8 -  BMI 29.26 29.23 29.76 29.95 30.18 30.65 -   Over corrected , so amlodipine discontinued

## 2016-05-28 NOTE — Assessment & Plan Note (Signed)
Excessive loos stool , advised d/c metformin, she is to call back if persists for GI eval

## 2016-05-29 ENCOUNTER — Other Ambulatory Visit: Payer: Self-pay | Admitting: *Deleted

## 2016-05-29 ENCOUNTER — Telehealth: Payer: Self-pay | Admitting: Family Medicine

## 2016-05-29 ENCOUNTER — Other Ambulatory Visit (HOSPITAL_BASED_OUTPATIENT_CLINIC_OR_DEPARTMENT_OTHER): Payer: Medicare Other

## 2016-05-29 ENCOUNTER — Ambulatory Visit: Payer: Medicare Other

## 2016-05-29 DIAGNOSIS — C9000 Multiple myeloma not having achieved remission: Secondary | ICD-10-CM

## 2016-05-29 DIAGNOSIS — C9002 Multiple myeloma in relapse: Secondary | ICD-10-CM | POA: Diagnosis not present

## 2016-05-29 DIAGNOSIS — R1310 Dysphagia, unspecified: Secondary | ICD-10-CM

## 2016-05-29 LAB — CBC WITH DIFFERENTIAL/PLATELET
BASO%: 0.2 % (ref 0.0–2.0)
BASOS ABS: 0 10*3/uL (ref 0.0–0.1)
EOS%: 0.7 % (ref 0.0–7.0)
Eosinophils Absolute: 0 10*3/uL (ref 0.0–0.5)
HEMATOCRIT: 27.3 % — AB (ref 34.8–46.6)
HEMOGLOBIN: 8.6 g/dL — AB (ref 11.6–15.9)
LYMPH#: 0.3 10*3/uL — AB (ref 0.9–3.3)
LYMPH%: 7.5 % — AB (ref 14.0–49.7)
MCH: 30.2 pg (ref 25.1–34.0)
MCHC: 31.5 g/dL (ref 31.5–36.0)
MCV: 95.8 fL (ref 79.5–101.0)
MONO#: 0.3 10*3/uL (ref 0.1–0.9)
MONO%: 6.1 % (ref 0.0–14.0)
NEUT#: 3.6 10*3/uL (ref 1.5–6.5)
NEUT%: 85.5 % — AB (ref 38.4–76.8)
Platelets: 54 10*3/uL — ABNORMAL LOW (ref 145–400)
RBC: 2.85 10*6/uL — ABNORMAL LOW (ref 3.70–5.45)
RDW: 18.6 % — ABNORMAL HIGH (ref 11.2–14.5)
WBC: 4.3 10*3/uL (ref 3.9–10.3)
nRBC: 2 % — ABNORMAL HIGH (ref 0–0)

## 2016-05-29 LAB — COMPREHENSIVE METABOLIC PANEL
ALBUMIN: 2.9 g/dL — AB (ref 3.5–5.0)
ALK PHOS: 74 U/L (ref 40–150)
ALT: 15 U/L (ref 0–55)
ANION GAP: 10 meq/L (ref 3–11)
AST: 20 U/L (ref 5–34)
BUN: 14.1 mg/dL (ref 7.0–26.0)
CALCIUM: 8.6 mg/dL (ref 8.4–10.4)
CHLORIDE: 106 meq/L (ref 98–109)
CO2: 23 mEq/L (ref 22–29)
Creatinine: 1 mg/dL (ref 0.6–1.1)
EGFR: 66 mL/min/{1.73_m2} — AB (ref >90)
Glucose: 147 mg/dl — ABNORMAL HIGH (ref 70–140)
POTASSIUM: 3.7 meq/L (ref 3.5–5.1)
Sodium: 139 mEq/L (ref 136–145)
Total Bilirubin: 1.04 mg/dL (ref 0.20–1.20)
Total Protein: 6.6 g/dL (ref 6.4–8.3)

## 2016-05-29 NOTE — Telephone Encounter (Signed)
Do you agree to this?

## 2016-05-29 NOTE — Progress Notes (Signed)
Dr. [name] reviewed lab results today.  Postpone chemo to 1 week as per md.  Explanations given to pt and nephew.  Pt has appt for lab and chemo on 6/5 prior to discharge home.

## 2016-05-29 NOTE — Telephone Encounter (Signed)
Darlene Howard has decided to go ahead and get a referral to [facility] for difficulty swallowing, please advise?

## 2016-05-29 NOTE — Telephone Encounter (Signed)
Referral entered pls let her know

## 2016-06-01 ENCOUNTER — Other Ambulatory Visit: Payer: Self-pay | Admitting: Medical Oncology

## 2016-06-01 ENCOUNTER — Ambulatory Visit: Payer: Medicare Other

## 2016-06-01 DIAGNOSIS — C9000 Multiple myeloma not having achieved remission: Secondary | ICD-10-CM

## 2016-06-04 ENCOUNTER — Ambulatory Visit (HOSPITAL_BASED_OUTPATIENT_CLINIC_OR_DEPARTMENT_OTHER): Payer: Medicare Other | Admitting: Oncology

## 2016-06-04 ENCOUNTER — Other Ambulatory Visit (HOSPITAL_BASED_OUTPATIENT_CLINIC_OR_DEPARTMENT_OTHER): Payer: Medicare Other

## 2016-06-04 ENCOUNTER — Encounter: Payer: Self-pay | Admitting: Oncology

## 2016-06-04 ENCOUNTER — Encounter: Payer: Self-pay | Admitting: Internal Medicine

## 2016-06-04 ENCOUNTER — Ambulatory Visit (HOSPITAL_BASED_OUTPATIENT_CLINIC_OR_DEPARTMENT_OTHER): Payer: Medicare Other

## 2016-06-04 ENCOUNTER — Telehealth: Payer: Self-pay | Admitting: *Deleted

## 2016-06-04 VITALS — BP 92/54 | HR 115 | Temp 98.3°F | Resp 18 | Ht 66.0 in | Wt 179.5 lb

## 2016-06-04 DIAGNOSIS — C9002 Multiple myeloma in relapse: Secondary | ICD-10-CM

## 2016-06-04 DIAGNOSIS — Z5112 Encounter for antineoplastic immunotherapy: Secondary | ICD-10-CM

## 2016-06-04 DIAGNOSIS — C9000 Multiple myeloma not having achieved remission: Secondary | ICD-10-CM

## 2016-06-04 LAB — COMPREHENSIVE METABOLIC PANEL
ALK PHOS: 84 U/L (ref 40–150)
ALT: 21 U/L (ref 0–55)
ANION GAP: 11 meq/L (ref 3–11)
AST: 19 U/L (ref 5–34)
Albumin: 2.8 g/dL — ABNORMAL LOW (ref 3.5–5.0)
BILIRUBIN TOTAL: 0.9 mg/dL (ref 0.20–1.20)
BUN: 22.2 mg/dL (ref 7.0–26.0)
CALCIUM: 9.5 mg/dL (ref 8.4–10.4)
CO2: 21 meq/L — AB (ref 22–29)
CREATININE: 1.3 mg/dL — AB (ref 0.6–1.1)
Chloride: 107 mEq/L (ref 98–109)
EGFR: 49 mL/min/{1.73_m2} — AB (ref >90)
Glucose: 159 mg/dl — ABNORMAL HIGH (ref 70–140)
Potassium: 3.9 mEq/L (ref 3.5–5.1)
SODIUM: 139 meq/L (ref 136–145)
TOTAL PROTEIN: 6.7 g/dL (ref 6.4–8.3)

## 2016-06-04 LAB — CBC WITH DIFFERENTIAL/PLATELET
BASO%: 0.3 % (ref 0.0–2.0)
Basophils Absolute: 0 10*3/uL (ref 0.0–0.1)
EOS%: 1 % (ref 0.0–7.0)
Eosinophils Absolute: 0.1 10*3/uL (ref 0.0–0.5)
HCT: 25.5 % — ABNORMAL LOW (ref 34.8–46.6)
HEMOGLOBIN: 8 g/dL — AB (ref 11.6–15.9)
LYMPH#: 0.8 10*3/uL — AB (ref 0.9–3.3)
LYMPH%: 10.6 % — ABNORMAL LOW (ref 14.0–49.7)
MCH: 30.2 pg (ref 25.1–34.0)
MCHC: 31.4 g/dL — ABNORMAL LOW (ref 31.5–36.0)
MCV: 96.2 fL (ref 79.5–101.0)
MONO#: 0.4 10*3/uL (ref 0.1–0.9)
MONO%: 5.5 % (ref 0.0–14.0)
NEUT#: 6.5 10*3/uL (ref 1.5–6.5)
NEUT%: 82.6 % — ABNORMAL HIGH (ref 38.4–76.8)
NRBC: 1 % — AB (ref 0–0)
Platelets: 45 10*3/uL — ABNORMAL LOW (ref 145–400)
RBC: 2.65 10*6/uL — AB (ref 3.70–5.45)
RDW: 18.8 % — AB (ref 11.2–14.5)
WBC: 7.8 10*3/uL (ref 3.9–10.3)

## 2016-06-04 LAB — TECHNOLOGIST REVIEW

## 2016-06-04 MED ORDER — PROCHLORPERAZINE MALEATE 10 MG PO TABS
ORAL_TABLET | ORAL | Status: AC
  Filled 2016-06-04: qty 1

## 2016-06-04 MED ORDER — BORTEZOMIB CHEMO SQ INJECTION 3.5 MG (2.5MG/ML)
1.3000 mg/m2 | Freq: Once | INTRAMUSCULAR | Status: AC
  Administered 2016-06-04: 2.5 mg via SUBCUTANEOUS
  Filled 2016-06-04: qty 2.5

## 2016-06-04 MED ORDER — PROCHLORPERAZINE MALEATE 10 MG PO TABS
10.0000 mg | ORAL_TABLET | Freq: Once | ORAL | Status: AC
  Administered 2016-06-04: 10 mg via ORAL

## 2016-06-04 NOTE — Telephone Encounter (Signed)
Per staff message and POF I have scheduled appts. Advised scheduler of appts. JMW  

## 2016-06-04 NOTE — Progress Notes (Signed)
[place] [phone]   [phone]  OFFICE PROGRESS NOTE  Darlene Nakayama, MD [address], [address] [place] [address]  DIAGNOSIS: Stage IIA IgA kappa multiple myeloma diagnosed in 2006   PRIOR THERAPY:  1) status post treatment with thalidomide and Decadron followed by autologous peripheral blood stem cell transplant with high-dose melphalan on 04/26/2006 at [facility].  2) the patient had evidence for disease recurrence in August of 2008 and she was treated with 6 cycles of Velcade completed on 03/30/2008 with response to the treatment but was discontinued secondary to neuropathy.  3) maintenance treatment with Revlimid 10 mg by mouth daily by the does was later increase to 15 mg by mouth daily secondary to biochemical recurrence with stabilization of her disease. The treatment with Revlimid 15 mg by mouth daily was discontinued secondary to evidence for disease progression. 4) Revlimid 25 mg by mouth daily for 3 weeks every 4 weeks in addition to Decadron currently at 20 mg by mouth on a weekly basis. She is status post 12 cycles. This was discontinued secondary to disease progression.   CURRENT THERAPY:  1) systemic chemotherapy with Daratumumab 16 MG/KG weekly for the first 9 weeks, Velcade 1.3 MG/M2 subcutaneously on days 1, 4, 8 and 11 every 3 weeks in addition to Decadron 20 mg by mouth on days 1, 2, 4, 5, 8, 9, 11 and 12 every 3 weeks. First dose 05/07/2016 2) Zometa 4 mg IV every 3 months. First dose in 05/05/2015  ADVANCED DIRECTIVE: She does not have advanced directive and was given information.  INTERVAL HISTORY: TALOR DESROSIERS 65 y.o. female returns to the clinic today for routine followup visit accompanied by her brother and nephew. The patient started treatment with Velcade and daratumumab on 05/07/2016. Treatment has been held since 05/10/2016 due to thrombocytopenia. She has completed radiation to her right chest wall.  The patient denied having any fever or chills. She denied having any shortness of breath, cough or hemoptysis. Denies dyspnea on exertion. She has no weight loss or night sweats. She has loose stools about 3 times per day and is using Imodium only one tablet per day. Denies bleeding. She is here for for review of blood work and for consideration of resuming her chemotherapy.  MEDICAL HISTORY: Past Medical History  Diagnosis Date  . Glaucoma     uses eye drops as instructed  . Personal history of colon cancer     ASCENDING COLON--  S/P  RIGHT HEMICOLECTOMY WITH NEGATIVE NODES/    NO RECURRENCE  . Herpes     takes Valtrex as needed  . GERD (gastroesophageal reflux disease)     takes Pantoprazole daily  . Hypertension     takes Amlodipine and Diovan daily  . Bruises easily     d/t being on COumadin  . History of colon polyps   . Type 2 diabetes mellitus ([place])   . 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 H/O stem cell transplant ([place])     04-26-2006  AT [facility]  . Multiple myeloma DX  2006  STATE IIA,  IgA  [facility]    STABLE  PER DR MOHAMED--  S/P STEM CELL TRANSPLANT 2007 /  RECURRENCE 2008  CHEMO ENDED 03-30-2008/   NOW ON MAINTANANCE REVLIMID )  . Vocal cord nodule     resolved- see Dr. Deeann Saint notes in media tab  06/17/14  . DDD (degenerative disc disease), lumbar   . Cholelithiasis   . Fatty liver   . Lung cancer ([place])     chest wall lesion aadn 6th rib    ALLERGIES:  is allergic to codeine and tramadol.  MEDICATIONS:  Current Outpatient Prescriptions  Medication Sig Dispense Refill  . acetaminophen (TYLENOL) 500 MG tablet Take 500 mg by mouth daily as needed for moderate pain.    Marland Kitchen aspirin (ASPIRIN LOW DOSE) 81 MG EC tablet Take 81 mg by mouth daily.      . calcium-vitamin D (OSCAL 500/200 D-3) 500-200 MG-UNIT per tablet Take 1 tablet by mouth 3 (three) times daily.      . cholecalciferol (VITAMIN D) 1000 UNITS tablet Take  1,000 Units by mouth daily. Reported on 04/24/2016    . dexamethasone (DECADRON) 4 MG tablet 5 tablets by mouth daily on days 1, 2, 4, 5, 8, 9, 11 and 12 every 3 weeks starting with the first dose of chemotherapy on 05/07/2016 10 tablet 1  . dorzolamide-timolol (COSOPT) 22.3-6.8 MG/ML ophthalmic solution Place 1 drop into the left eye 2 (two) times daily.     Marland Kitchen gabapentin (NEURONTIN) 100 MG capsule Take 1 capsule by mouth 3  times daily 270 capsule 1  . hyaluronate sodium (RADIAPLEXRX) GEL Apply 1 application topically 2 (two) times daily.    Marland Kitchen loperamide (IMODIUM A-D) 2 MG tablet Take 2 mg by mouth daily as needed for diarrhea or loose stools.    Marland Kitchen loratadine (CLARITIN) 10 MG tablet TAKE ONE TABLET BY MOUTH ONCE DAILY. (Patient taking differently: TAKE 10 MG BY MOUTH ONCE DAILY.) 30 tablet 3  . LUMIGAN 0.01 % SOLN Place 1 drop into both eyes at bedtime.     . magic mouthwash w/lidocaine SOLN Take 5 mLs by mouth 4 (four) times daily as needed for mouth pain. 400 mL 0  . Multiple Vitamins-Minerals (CENTRUM SILVER PO) Take 1 tablet by mouth daily.     . ondansetron (ZOFRAN ODT) 4 MG disintegrating tablet Take 1 tablet (4 mg total) by mouth every 8 (eight) hours as needed for nausea or vomiting. 20 tablet 0  . ondansetron (ZOFRAN) 4 MG tablet Take 1 tablet (4 mg total) by mouth every 6 (six) hours. 10 tablet 0  . ONE TOUCH ULTRA TEST test strip USE AS DIRECTED TO CHECK DAILY. 50 each 3  . oxyCODONE (ROXICODONE) 5 MG immediate release tablet Take 1 tablet (5 mg total) by mouth every 4 (four) hours as needed for severe pain. 30 tablet 0  . oxyCODONE-acetaminophen (PERCOCET/ROXICET) 5-325 MG tablet Take 1 tablet by mouth every 8 (eight) hours as needed for severe pain. 40 tablet 0  . pantoprazole (PROTONIX) 40 MG tablet TAKE 1 TABLET BY MOUTH  DAILY FOR ACID REFLUX 90 tablet 1  . potassium chloride SA (K-DUR,KLOR-CON) 20 MEQ tablet Take 1 tablet by mouth  twice a day (Patient taking differently: Take 20 meq  by mouth twice daily) 180 tablet 0  . PREMARIN vaginal cream PLACE 1 APPLICATORFUL VAGINALLY TWICE A WEEK. 30 g 0  . sucralfate (CARAFATE) 1 g tablet Take 1 tablet (1 g total) by mouth 4 (four) times daily -  with meals and at bedtime. Mix in 2 teaspoons of water to make a slurry and take 5-30 minutes before meals and hs 120 tablet 0  . temazepam (RESTORIL) 15 MG capsule Take 1 capsule (15 mg total) by mouth at bedtime as needed for sleep. 30 capsule 1  .  valACYclovir (VALTREX) 1000 MG tablet Take 1 tablet by mouth  daily as needed (Patient taking differently: Take 1 tablet by mouth  daily as needed for outbreaks) 90 tablet 1  . valsartan-hydrochlorothiazide (DIOVAN-HCT) 320-25 MG tablet Take 1 tablet by mouth  daily 90 tablet 0   No current facility-administered medications for this visit.    SURGICAL HISTORY:  Past Surgical History  Procedure Laterality Date  . Colonoscopy  10/07/2006    FIE:PPIRJJ rectum/Status post right hemicolectomy. Normal residual colon  . Cervical cone biopsy    . Cataract extraction w/phaco Left 01/20/2014    Procedure: CATARACT EXTRACTION PHACO AND INTRAOCULAR LENS PLACEMENT (IOC);  Surgeon: Adonis Brook, MD;  Location: [facility];  Service: Ophthalmology;  Laterality: Left;  . 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 Co2  ablation vulva perianal and vaginal dysplatic areas  02-15-2010  . Abdominal hysterectomy  1983    w/  appendectomy and left salpingoophorectom  . Hemicolectomy Right 04-27-2005    CARCINOMA   ASCENDING COLON  . Vulvectomy N/A 05/06/2014    Procedure: WIDE LOCAL EXCISION LEFT LABIA MAJORA;  Surgeon: Janie Morning, MD;  Location: [facility];  Service: Gynecology;  Laterality: N/A;  . Vulvar lesion removal N/A 05/06/2014    Procedure: LASER OF VULVAR LESION;  Surgeon: Janie Morning, MD;  Location[facility];  Service: Gynecology;  Laterality: N/A;  . Lesion removal N/A 05/06/2014    Procedure: LASER OF THE  VAGINA;  Surgeon: Janie Morning, MD;  Location: [facility];  Service: Gynecology;  Laterality: N/A;  . Esophagogastroduodenoscopy  04/02/2005    OAC:ZYSAYT/KZSWFUXNATFT polyp with central erosion versus ulceration in the body of the stomach, resected and recovered Remainder of the gastric mucosa, D1, D2 appeared normal  . Colonoscopy  10/2009    RMR: diminutive RS polyp (hyperplastic).   . Colonoscopy N/A 12/15/2014    Procedure: COLONOSCOPY;  Surgeon: Daneil Dolin, MD;  [facility];  Service: Endoscopy;  Laterality: N/A;  130   . Cholecystectomy N/A 02/28/2016    Procedure: LAPAROSCOPIC CHOLECYSTECTOMY;  Surgeon: Ralene Ok, MD;  Location: [place];  Service: General;  Laterality: N/A;    REVIEW OF SYSTEMS:  Constitutional: positive for fatigue Eyes: negative Ears, nose, mouth, throat, and face: negative Respiratory: positive for pleurisy/chest pain Cardiovascular: negative Gastrointestinal: negative Genitourinary:negative Integument/breast: negative Hematologic/lymphatic: negative Musculoskeletal:negative Neurological: negative Behavioral/Psych: negative Endocrine: negative Allergic/Immunologic: negative   PHYSICAL EXAMINATION: General appearance: alert, cooperative and no distress Head: Normocephalic, without obvious abnormality, atraumatic Neck: no adenopathy Lymph nodes: Cervical, supraclavicular, and axillary nodes normal. Resp: clear to auscultation bilaterally Back: symmetric, no curvature. ROM normal. No CVA tenderness. Cardio: regular rate and rhythm, S1, S2 normal, no murmur, click, rub or gallop GI: soft, non-tender; bowel sounds normal; no masses,  no organomegaly Extremities: extremities normal, atraumatic, no cyanosis or edema Neurologic: Alert and oriented X 3, normal strength and tone. Normal symmetric reflexes. Normal coordination and gait  ECOG PERFORMANCE STATUS: 1 - Symptomatic but completely ambulatory  Blood pressure  92/54, pulse 115, temperature 98.3 F (36.8 C), temperature source Oral, resp. rate 18, height '5\' 6"'  (1.676 m), weight 179 lb 8 oz (81.421 kg), SpO2 100 %.  LABORATORY DATA: Lab Results  Component Value Date   WBC 7.8 06/04/2016   HGB 8.0* 06/04/2016   HCT 25.5* 06/04/2016   MCV 96.2 06/04/2016   PLT 45* 06/04/2016      Chemistry  Component Value Date/Time   NA 139 06/04/2016 0808   NA 138 05/19/2016 0823   K 3.9 06/04/2016 0808   K 3.7 05/19/2016 0823   CL 106 05/19/2016 0823   CL 105 06/17/2013 1441   CO2 21* 06/04/2016 0808   CO2 21 05/19/2016 0823   BUN 22.2 06/04/2016 0808   BUN 12 05/19/2016 0823   CREATININE 1.3* 06/04/2016 0808   CREATININE 0.77 05/19/2016 0823   CREATININE 0.93 04/24/2016 2003      Component Value Date/Time   CALCIUM 9.5 06/04/2016 0808   CALCIUM 8.8 05/19/2016 0823   ALKPHOS 84 06/04/2016 0808   ALKPHOS 58 04/24/2016 2003   AST 19 06/04/2016 0808   AST 14* 04/24/2016 2003   ALT 21 06/04/2016 0808   ALT 14 04/24/2016 2003   BILITOT 0.90 06/04/2016 0808   BILITOT 1.3* 04/24/2016 2003       RADIOGRAPHIC STUDIES: No results found.  ASSESSMENT AND PLAN: This is a very pleasant 65 year old African American female with history of multiple myeloma currently on treatment with Revlimid and low-dose Decadron and tolerated her treatment fairly well. The patient is doing fine today with no specific complaints except for mild fatigue.  Because of disease progression, I recommended for her to continue her treatment with Revlimid and Decadron but I increased the dose of Revlimid to 25 mg by mouth daily for 21 days every 4 weeks, status post 12 months of this regimen. The recent myeloma panel performed at [facility] showed evidence for disease progression. The patient also has enlarging right-sided chest wall lesion. Repeat skeletal bone survey was overall stable.  The patient was seen and examined with Dr. [name]. The patient's platelets  remain low at 45,000. Discussed with patient and we will need to hold her Daratumumab, but we will continue to proceed with the Velcade and Decadron. She will continue the Velcade and dexamethasone on a day 1, day 4, day 8, and day 11 schedule. We will plan to recheck her labs next week. We have encouraged the patient to contact [facility] as she may need another appointment to discuss further treatment options. I have left a message for the nurse practitioner, Windell Moulding, and have asked her to return my call.  The patient will continue her treatment with Zometa every 3 months as a scheduled. Last dose was given on 04/27/2016.  Discussed use of Imodium with the patient for control of her diarrhea. She will contact us if the Imodium was not controlling her symptoms.  I have instructed the patient to hold her Diovan HCT because her blood pressure is low today. I have encouraged her to check her blood pressure daily and to not take her blood pressure medication unless her blood pressure is above 120/70.  She will come back for follow-up visit in 1-2 weeks for evaluation and management of any adverse effect of her treatment.  She was advised to call immediately if she has any concerning symptoms and interval.  The patient voices understanding of current disease status and treatment options and is in agreement with the current care plan.  All questions were answered. The patient knows to call the clinic with any problems, questions or concerns. We can certainly see the patient much sooner if necessary.   Mikey Bussing, DNP, AGPCNP-BC, AOCNP  ADDENDUM: Hematology/Oncology Attending: I had a face to face encounter with the patient today. I recommended her care plan. She is a very pleasant 65 years old African-American female with relapsed  multiple myeloma who was started recently on treatment with Daratumumab, Velcade and Decadron. She also received palliative radiotherapy to the right sixth rib. Her  platelets count has been low over the last few weeks likely secondary to her systemic therapy as well as the palliative radiation but we cannot rule out the possibility of further disease progression. I recommended for the patient to proceed with the second cycle of her chemotherapy but only with Velcade and Decadron for now. We will monitor her platelets count closely during her treatment and if she continues to have worsening of her platelets count, we will consider repeating her myeloma panel as well as a bone marrow biopsy and aspirate to rule out any evidence of disease progression. I discussed her case with Dr. Bearl Mulberry at [facility] who agreed with the current plan. She would come back for follow-up visit in one week for evaluation and repeat blood work. The patient was advised to call immediately if she has any concerning symptoms in the interval.  Disclaimer: This note was dictated with voice recognition software. Similar sounding words can inadvertently be transcribed and may be missed upon review. Eilleen Kempf., MD 06/04/2016

## 2016-06-04 NOTE — Progress Notes (Signed)
Pt okay to treat with platelet of 45 today. No Dara infusion today, per Altamese Dilling, NP

## 2016-06-04 NOTE — Patient Instructions (Signed)
[facility] Discharge Instructions for Patients Receiving Chemotherapy  Today you received the following chemotherapy agents Velcade. To help prevent nausea and vomiting after your treatment, we encourage you to take your nausea medication as directed.  If you develop nausea and vomiting that is not controlled by your nausea medication, call the clinic.   BELOW ARE SYMPTOMS THAT SHOULD BE REPORTED IMMEDIATELY:  *FEVER GREATER THAN 100.5 F  *CHILLS WITH OR WITHOUT FEVER  NAUSEA AND VOMITING THAT IS NOT CONTROLLED WITH YOUR NAUSEA MEDICATION  *UNUSUAL SHORTNESS OF BREATH  *UNUSUAL BRUISING OR BLEEDING  TENDERNESS IN MOUTH AND THROAT WITH OR WITHOUT PRESENCE OF ULCERS  *URINARY PROBLEMS  *BOWEL PROBLEMS  UNUSUAL RASH Items with * indicate a potential emergency and should be followed up as soon as possible.  Feel free to call the clinic you have any questions or concerns. The clinic phone number is [phone].  Please show the CHEMO ALERT CARD at check-in to the Emergency Department and triage nurse.    

## 2016-06-07 ENCOUNTER — Ambulatory Visit (HOSPITAL_BASED_OUTPATIENT_CLINIC_OR_DEPARTMENT_OTHER): Payer: Medicare Other

## 2016-06-07 ENCOUNTER — Ambulatory Visit: Payer: Medicare Other

## 2016-06-07 VITALS — BP 134/82 | HR 108 | Temp 97.7°F | Resp 18

## 2016-06-07 DIAGNOSIS — C9002 Multiple myeloma in relapse: Secondary | ICD-10-CM | POA: Diagnosis not present

## 2016-06-07 DIAGNOSIS — Z5112 Encounter for antineoplastic immunotherapy: Secondary | ICD-10-CM | POA: Diagnosis not present

## 2016-06-07 DIAGNOSIS — C9000 Multiple myeloma not having achieved remission: Secondary | ICD-10-CM

## 2016-06-07 MED ORDER — PROCHLORPERAZINE MALEATE 10 MG PO TABS
ORAL_TABLET | ORAL | Status: AC
  Filled 2016-06-07: qty 1

## 2016-06-07 MED ORDER — BORTEZOMIB CHEMO SQ INJECTION 3.5 MG (2.5MG/ML)
1.3000 mg/m2 | Freq: Once | INTRAMUSCULAR | Status: AC
  Administered 2016-06-07: 2.5 mg via SUBCUTANEOUS
  Filled 2016-06-07: qty 2.5

## 2016-06-07 MED ORDER — PROCHLORPERAZINE MALEATE 10 MG PO TABS
10.0000 mg | ORAL_TABLET | Freq: Once | ORAL | Status: AC
  Administered 2016-06-07: 10 mg via ORAL

## 2016-06-07 NOTE — Progress Notes (Signed)
Patient reports that she did take her steroids this morning as ordered. Pt also reported that she has had multiple loose stools over the last two days but was hesitant to take Imodium. Pt educated that she can take up to 8 tablets daily and she agreed to call the clinic if the Imodium does not control her loose stools.

## 2016-06-07 NOTE — Patient Instructions (Signed)
[facility] Discharge Instructions for Patients Receiving Chemotherapy  Today you received the following chemotherapy agents Velcade. To help prevent nausea and vomiting after your treatment, we encourage you to take your nausea medication as directed.  If you develop nausea and vomiting that is not controlled by your nausea medication, call the clinic.   BELOW ARE SYMPTOMS THAT SHOULD BE REPORTED IMMEDIATELY:  *FEVER GREATER THAN 100.5 F  *CHILLS WITH OR WITHOUT FEVER  NAUSEA AND VOMITING THAT IS NOT CONTROLLED WITH YOUR NAUSEA MEDICATION  *UNUSUAL SHORTNESS OF BREATH  *UNUSUAL BRUISING OR BLEEDING  TENDERNESS IN MOUTH AND THROAT WITH OR WITHOUT PRESENCE OF ULCERS  *URINARY PROBLEMS  *BOWEL PROBLEMS  UNUSUAL RASH Items with * indicate a potential emergency and should be followed up as soon as possible.  Feel free to call the clinic you have any questions or concerns. The clinic phone number is [phone].  Please show the CHEMO ALERT CARD at check-in to the Emergency Department and triage nurse.    

## 2016-06-08 ENCOUNTER — Other Ambulatory Visit: Payer: Self-pay | Admitting: Medical Oncology

## 2016-06-08 DIAGNOSIS — C9000 Multiple myeloma not having achieved remission: Secondary | ICD-10-CM

## 2016-06-11 ENCOUNTER — Ambulatory Visit (HOSPITAL_BASED_OUTPATIENT_CLINIC_OR_DEPARTMENT_OTHER): Payer: Medicare Other

## 2016-06-11 ENCOUNTER — Other Ambulatory Visit: Payer: Self-pay | Admitting: Medical Oncology

## 2016-06-11 ENCOUNTER — Telehealth: Payer: Self-pay | Admitting: *Deleted

## 2016-06-11 ENCOUNTER — Other Ambulatory Visit: Payer: Self-pay | Admitting: Internal Medicine

## 2016-06-11 ENCOUNTER — Ambulatory Visit (HOSPITAL_COMMUNITY)
Admission: RE | Admit: 2016-06-11 | Discharge: 2016-06-11 | Disposition: A | Discharge status: Home / Self Care | Payer: Medicare Other | Source: Ambulatory Visit | Attending: Internal Medicine | Admitting: Internal Medicine

## 2016-06-11 ENCOUNTER — Other Ambulatory Visit: Payer: Self-pay | Admitting: *Deleted

## 2016-06-11 ENCOUNTER — Other Ambulatory Visit (HOSPITAL_BASED_OUTPATIENT_CLINIC_OR_DEPARTMENT_OTHER): Payer: Medicare Other

## 2016-06-11 VITALS — BP 136/67 | HR 77 | Temp 97.8°F | Resp 18

## 2016-06-11 DIAGNOSIS — D649 Anemia, unspecified: Secondary | ICD-10-CM | POA: Insufficient documentation

## 2016-06-11 DIAGNOSIS — C9002 Multiple myeloma in relapse: Secondary | ICD-10-CM

## 2016-06-11 DIAGNOSIS — C9000 Multiple myeloma not having achieved remission: Secondary | ICD-10-CM

## 2016-06-11 LAB — CBC WITH DIFFERENTIAL/PLATELET
BASO%: 0.1 % (ref 0.0–2.0)
BASOS ABS: 0 10*3/uL (ref 0.0–0.1)
EOS ABS: 0 10*3/uL (ref 0.0–0.5)
EOS%: 0 % (ref 0.0–7.0)
HEMATOCRIT: 24.5 % — AB (ref 34.8–46.6)
HEMOGLOBIN: 7.6 g/dL — AB (ref 11.6–15.9)
LYMPH#: 0.4 10*3/uL — AB (ref 0.9–3.3)
LYMPH%: 4.7 % — AB (ref 14.0–49.7)
MCH: 30.5 pg (ref 25.1–34.0)
MCHC: 31 g/dL — ABNORMAL LOW (ref 31.5–36.0)
MCV: 98.4 fL (ref 79.5–101.0)
MONO#: 1.4 10*3/uL — AB (ref 0.1–0.9)
MONO%: 15 % — ABNORMAL HIGH (ref 0.0–14.0)
NEUT#: 7.6 10*3/uL — ABNORMAL HIGH (ref 1.5–6.5)
NEUT%: 80.2 % — AB (ref 38.4–76.8)
PLATELETS: 25 10*3/uL — AB (ref 145–400)
RBC: 2.49 10*6/uL — ABNORMAL LOW (ref 3.70–5.45)
RDW: 19.8 % — ABNORMAL HIGH (ref 11.2–14.5)
WBC: 9.4 10*3/uL (ref 3.9–10.3)
nRBC: 4 % — ABNORMAL HIGH (ref 0–0)

## 2016-06-11 LAB — COMPREHENSIVE METABOLIC PANEL
ALT: 17 U/L (ref 0–55)
AST: 11 U/L (ref 5–34)
Albumin: 3 g/dL — ABNORMAL LOW (ref 3.5–5.0)
Alkaline Phosphatase: 75 U/L (ref 40–150)
Anion Gap: 8 mEq/L (ref 3–11)
BUN: 22 mg/dL (ref 7.0–26.0)
CHLORIDE: 113 meq/L — AB (ref 98–109)
CO2: 19 meq/L — AB (ref 22–29)
CREATININE: 0.9 mg/dL (ref 0.6–1.1)
Calcium: 8.8 mg/dL (ref 8.4–10.4)
EGFR: 77 mL/min/{1.73_m2} — ABNORMAL LOW (ref >90)
GLUCOSE: 125 mg/dL (ref 70–140)
Potassium: 4.3 mEq/L (ref 3.5–5.1)
SODIUM: 140 meq/L (ref 136–145)
Total Bilirubin: 0.69 mg/dL (ref 0.20–1.20)
Total Protein: 6.6 g/dL (ref 6.4–8.3)

## 2016-06-11 LAB — PREPARE RBC (CROSSMATCH)

## 2016-06-11 LAB — TECHNOLOGIST REVIEW

## 2016-06-11 MED ORDER — ACETAMINOPHEN 325 MG PO TABS
ORAL_TABLET | ORAL | Status: AC
  Filled 2016-06-11: qty 2

## 2016-06-11 MED ORDER — HEPARIN SOD (PORK) LOCK FLUSH 100 UNIT/ML IV SOLN
500.0000 [IU] | Freq: Every day | INTRAVENOUS | Status: DC | PRN
  Filled 2016-06-11: qty 5

## 2016-06-11 MED ORDER — DIPHENHYDRAMINE HCL 25 MG PO CAPS
ORAL_CAPSULE | ORAL | Status: AC
  Filled 2016-06-11: qty 1

## 2016-06-11 MED ORDER — SODIUM CHLORIDE 0.9 % IV SOLN
250.0000 mL | Freq: Once | INTRAVENOUS | Status: AC
  Administered 2016-06-11: 250 mL via INTRAVENOUS

## 2016-06-11 MED ORDER — SODIUM CHLORIDE 0.9% FLUSH
10.0000 mL | INTRAVENOUS | Status: DC | PRN
  Filled 2016-06-11: qty 10

## 2016-06-11 MED ORDER — SODIUM CHLORIDE 0.9% FLUSH
3.0000 mL | INTRAVENOUS | Status: DC | PRN
  Filled 2016-06-11: qty 10

## 2016-06-11 MED ORDER — DIPHENHYDRAMINE HCL 25 MG PO CAPS
25.0000 mg | ORAL_CAPSULE | Freq: Once | ORAL | Status: AC
  Administered 2016-06-11: 25 mg via ORAL

## 2016-06-11 MED ORDER — HEPARIN SOD (PORK) LOCK FLUSH 100 UNIT/ML IV SOLN
250.0000 [IU] | INTRAVENOUS | Status: DC | PRN
  Filled 2016-06-11: qty 5

## 2016-06-11 MED ORDER — ACETAMINOPHEN 325 MG PO TABS
650.0000 mg | ORAL_TABLET | Freq: Once | ORAL | Status: AC
  Administered 2016-06-11: 650 mg via ORAL

## 2016-06-11 NOTE — Telephone Encounter (Signed)
I have called and left the patient a message to the office. Need to schedule her blood transfusion appt

## 2016-06-11 NOTE — Patient Instructions (Signed)

## 2016-06-11 NOTE — Progress Notes (Signed)
Lab results called to Dr. [name], will not get treatment today.

## 2016-06-11 NOTE — Progress Notes (Unsigned)
Per MD, no treatment, pt will get 2units RBC today. Pt will need BMBX MD has communicated with Dr. Minna Antis at [facility]. Pt will be notified with additional information.

## 2016-06-12 ENCOUNTER — Telehealth: Payer: Self-pay

## 2016-06-12 ENCOUNTER — Telehealth: Payer: Self-pay | Admitting: Internal Medicine

## 2016-06-12 ENCOUNTER — Encounter: Payer: Self-pay | Admitting: Medical Oncology

## 2016-06-12 ENCOUNTER — Other Ambulatory Visit: Payer: Self-pay | Admitting: Medical Oncology

## 2016-06-12 ENCOUNTER — Ambulatory Visit (HOSPITAL_BASED_OUTPATIENT_CLINIC_OR_DEPARTMENT_OTHER): Payer: Medicare Other

## 2016-06-12 VITALS — BP 120/70 | HR 82 | Temp 98.2°F | Resp 18

## 2016-06-12 DIAGNOSIS — D649 Anemia, unspecified: Secondary | ICD-10-CM

## 2016-06-12 LAB — PREPARE RBC (CROSSMATCH)

## 2016-06-12 MED ORDER — ACETAMINOPHEN 325 MG PO TABS
ORAL_TABLET | ORAL | Status: AC
  Filled 2016-06-12: qty 2

## 2016-06-12 MED ORDER — ACETAMINOPHEN 325 MG PO TABS
650.0000 mg | ORAL_TABLET | Freq: Once | ORAL | Status: AC
  Administered 2016-06-12: 650 mg via ORAL

## 2016-06-12 MED ORDER — DIPHENHYDRAMINE HCL 25 MG PO CAPS
25.0000 mg | ORAL_CAPSULE | Freq: Once | ORAL | Status: AC
  Administered 2016-06-12: 25 mg via ORAL

## 2016-06-12 MED ORDER — SODIUM CHLORIDE 0.9 % IV SOLN
250.0000 mL | Freq: Once | INTRAVENOUS | Status: AC
  Administered 2016-06-12: 250 mL via INTRAVENOUS

## 2016-06-12 MED ORDER — DIPHENHYDRAMINE HCL 25 MG PO CAPS
ORAL_CAPSULE | ORAL | Status: AC
  Filled 2016-06-12: qty 1

## 2016-06-12 NOTE — Telephone Encounter (Signed)
Cancelled 6/15 chemo per pof

## 2016-06-12 NOTE — Patient Instructions (Signed)

## 2016-06-12 NOTE — Progress Notes (Signed)
Per [name] it is okay to transfuse blood today per Blood bank matching results.

## 2016-06-12 NOTE — Progress Notes (Signed)
Unable to get blood transfusion today due to unable to type and cross. Sent to lab for more blood work. Center will call when type and cross completed and schedule and time to come back for transfusion.

## 2016-06-13 LAB — TYPE AND SCREEN
ABO/RH(D): A POS
Antibody Screen: POSITIVE
DAT, IgG: NEGATIVE
UNIT DIVISION: 0
UNIT DIVISION: 0

## 2016-06-14 ENCOUNTER — Other Ambulatory Visit: Payer: Self-pay | Admitting: General Surgery

## 2016-06-14 ENCOUNTER — Ambulatory Visit: Payer: Medicare Other

## 2016-06-14 ENCOUNTER — Other Ambulatory Visit: Payer: Self-pay | Admitting: Radiology

## 2016-06-15 ENCOUNTER — Other Ambulatory Visit: Payer: Self-pay | Admitting: *Deleted

## 2016-06-15 ENCOUNTER — Ambulatory Visit (HOSPITAL_COMMUNITY)
Admission: RE | Admit: 2016-06-15 | Discharge: 2016-06-15 | Disposition: A | Discharge status: Home / Self Care | Payer: Medicare Other | Source: Ambulatory Visit | Attending: Internal Medicine | Admitting: Internal Medicine

## 2016-06-15 ENCOUNTER — Encounter (HOSPITAL_COMMUNITY): Payer: Self-pay

## 2016-06-15 DIAGNOSIS — D7581 Myelofibrosis: Secondary | ICD-10-CM | POA: Insufficient documentation

## 2016-06-15 DIAGNOSIS — K8020 Calculus of gallbladder without cholecystitis without obstruction: Secondary | ICD-10-CM | POA: Insufficient documentation

## 2016-06-15 DIAGNOSIS — K760 Fatty (change of) liver, not elsewhere classified: Secondary | ICD-10-CM | POA: Diagnosis not present

## 2016-06-15 DIAGNOSIS — I10 Essential (primary) hypertension: Secondary | ICD-10-CM | POA: Insufficient documentation

## 2016-06-15 DIAGNOSIS — K219 Gastro-esophageal reflux disease without esophagitis: Secondary | ICD-10-CM | POA: Insufficient documentation

## 2016-06-15 DIAGNOSIS — Z85038 Personal history of other malignant neoplasm of large intestine: Secondary | ICD-10-CM | POA: Diagnosis not present

## 2016-06-15 DIAGNOSIS — B009 Herpesviral infection, unspecified: Secondary | ICD-10-CM | POA: Insufficient documentation

## 2016-06-15 DIAGNOSIS — M899 Disorder of bone, unspecified: Secondary | ICD-10-CM | POA: Diagnosis not present

## 2016-06-15 DIAGNOSIS — D649 Anemia, unspecified: Secondary | ICD-10-CM | POA: Insufficient documentation

## 2016-06-15 DIAGNOSIS — Z9221 Personal history of antineoplastic chemotherapy: Secondary | ICD-10-CM | POA: Diagnosis not present

## 2016-06-15 DIAGNOSIS — C9000 Multiple myeloma not having achieved remission: Secondary | ICD-10-CM | POA: Diagnosis not present

## 2016-06-15 DIAGNOSIS — Z7982 Long term (current) use of aspirin: Secondary | ICD-10-CM | POA: Diagnosis not present

## 2016-06-15 DIAGNOSIS — D696 Thrombocytopenia, unspecified: Secondary | ICD-10-CM | POA: Diagnosis not present

## 2016-06-15 DIAGNOSIS — H409 Unspecified glaucoma: Secondary | ICD-10-CM | POA: Diagnosis not present

## 2016-06-15 DIAGNOSIS — Z86010 Personal history of colonic polyps: Secondary | ICD-10-CM | POA: Insufficient documentation

## 2016-06-15 DIAGNOSIS — E1140 Type 2 diabetes mellitus with diabetic neuropathy, unspecified: Secondary | ICD-10-CM | POA: Insufficient documentation

## 2016-06-15 DIAGNOSIS — M5136 Other intervertebral disc degeneration, lumbar region: Secondary | ICD-10-CM | POA: Diagnosis not present

## 2016-06-15 LAB — CBC
HEMATOCRIT: 31.9 % — AB (ref 36.0–46.0)
HEMOGLOBIN: 10.4 g/dL — AB (ref 12.0–15.0)
MCH: 29.7 pg (ref 26.0–34.0)
MCHC: 32.6 g/dL (ref 30.0–36.0)
MCV: 91.1 fL (ref 78.0–100.0)
Platelets: 38 10*3/uL — ABNORMAL LOW (ref 150–400)
RBC: 3.5 MIL/uL — ABNORMAL LOW (ref 3.87–5.11)
RDW: 21.7 % — AB (ref 11.5–15.5)
WBC: 7 10*3/uL (ref 4.0–10.5)

## 2016-06-15 LAB — PROTIME-INR
INR: 1.19 (ref 0.00–1.49)
PROTHROMBIN TIME: 14.8 s (ref 11.6–15.2)

## 2016-06-15 LAB — BONE MARROW EXAM: Bone Marrow Exam: 432

## 2016-06-15 LAB — APTT: APTT: 20 s — AB (ref 24–37)

## 2016-06-15 MED ORDER — FENTANYL CITRATE (PF) 100 MCG/2ML IJ SOLN
INTRAMUSCULAR | Status: AC | PRN
  Administered 2016-06-15: 50 ug via INTRAVENOUS

## 2016-06-15 MED ORDER — MIDAZOLAM HCL 2 MG/2ML IJ SOLN
INTRAMUSCULAR | Status: AC | PRN
  Administered 2016-06-15 (×4): 1 mg via INTRAVENOUS

## 2016-06-15 MED ORDER — MIDAZOLAM HCL 2 MG/2ML IJ SOLN
INTRAMUSCULAR | Status: AC
  Filled 2016-06-15: qty 6

## 2016-06-15 MED ORDER — FENTANYL CITRATE (PF) 100 MCG/2ML IJ SOLN
INTRAMUSCULAR | Status: AC
  Filled 2016-06-15: qty 4

## 2016-06-15 MED ORDER — SODIUM CHLORIDE 0.9 % IV SOLN
INTRAVENOUS | Status: DC
  Administered 2016-06-15: 07:00:00 via INTRAVENOUS

## 2016-06-15 NOTE — Procedures (Signed)
BM aspirate and core No comp/EBL

## 2016-06-15 NOTE — H&P (Signed)
Chief Complaint: thrombocytopenia, multiple myeloma  Referring Physician:Dr. Curt Bears  Supervising Physician: Marybelle Killings  Patient Status: Out-pt  HPI: Darlene Howard is an 65 y.o. female with a history of multiple myeloma currently undergoing treatment; however, she has had to stop or cut back on her treatment secondary to thrombocytopenia.  This has been low, but over the last couple of weeks has dramatically decreased even more.  A request has been made for a bone marrow biopsy.  She has been feeling well recently with the exception of feeling minimally tired.  Past Medical History:  Past Medical History  Diagnosis Date  . Glaucoma     uses eye drops as instructed  . Personal history of colon cancer     ASCENDING COLON--  S/P  RIGHT HEMICOLECTOMY WITH NEGATIVE NODES/    NO RECURRENCE  . Herpes     takes Valtrex as needed  . GERD (gastroesophageal reflux disease)     takes Pantoprazole daily  . Hypertension     takes Amlodipine and Diovan daily  . Bruises easily     d/t being on COumadin  . History of colon polyps   . Type 2 diabetes mellitus ([place])   . 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 H/O stem cell transplant ([place])     04-26-2006  AT [facility]  . Multiple myeloma DX  2006  STATE IIA,  IgA  KAPPA    STABLE  PER DR MOHAMED--  S/P STEM CELL TRANSPLANT 2007 /  RECURRENCE 2008  CHEMO ENDED 03-30-2008/   NOW ON MAINTANANCE REVLIMID )  . Vocal cord nodule     resolved- see Dr. Deeann Saint notes in media tab 06/17/14  . DDD (degenerative disc disease), lumbar   . Cholelithiasis   . Fatty liver   . Lung cancer ([employer])     chest wall lesion aadn 6th rib    Past Surgical History:  Past Surgical History  Procedure Laterality Date  . Colonoscopy  10/07/2006    HYQ:MVHQIO rectum/Status post right hemicolectomy. Normal residual colon  . Cervical cone biopsy    . Cataract extraction w/phaco Left 01/20/2014   Procedure: CATARACT EXTRACTION PHACO AND INTRAOCULAR LENS PLACEMENT (IOC);  Surgeon: Adonis Brook, MD;  Location: [place];  Service: Ophthalmology;  Laterality: Left;  . 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 Co2  ablation vulva perianal and vaginal dysplatic areas  02-15-2010  . Abdominal hysterectomy  1983    w/  appendectomy and left salpingoophorectom  . Hemicolectomy Right 04-27-2005    CARCINOMA   ASCENDING COLON  . Vulvectomy N/A 05/06/2014    Procedure: WIDE LOCAL EXCISION LEFT LABIA MAJORA;  Surgeon: Janie Morning, MD;  Location: [facility];  Service: Gynecology;  Laterality: N/A;  . Vulvar lesion removal N/A 05/06/2014    Procedure: LASER OF VULVAR LESION;  Surgeon: Janie Morning, MD;  Location: [facility];  Service: Gynecology;  Laterality: N/A;  . Lesion removal N/A 05/06/2014    Procedure: LASER OF THE VAGINA;  Surgeon: Janie Morning, MD;  Location: [facility];  Service: Gynecology;  Laterality: N/A;  . Esophagogastroduodenoscopy  04/02/2005    NGE:[id number] polyp with central erosion versus ulceration in the body of the stomach, resected and recovered Remainder of the gastric mucosa, D1, D2 appeared normal  . Colonoscopy  10/2009    RMR: diminutive RS polyp (hyperplastic).   Marland Kitchen  Colonoscopy N/A 12/15/2014    Procedure: COLONOSCOPY;  Surgeon: Daneil Dolin, MD;  [facility];  Service: Endoscopy;  Laterality: N/A;  130   . Cholecystectomy N/A 02/28/2016    Procedure: LAPAROSCOPIC CHOLECYSTECTOMY;  Surgeon: Ralene Ok, MD;  Location: [facility] OR;  Service: General;  Laterality: N/A;    Family History:  Family History  Problem Relation Age of Onset  . Kidney failure Mother   . Hypertension Mother   . Prostate cancer Father 54  . Hypertension Father     vascular disease   . Stroke Father   . Hypertension Sister   . Hypertension Sister   . Colon cancer Neg Hx   . Breast cancer Neg Hx   .  Hypertension Sister   . Hyperlipidemia Sister   . Hypertension Brother   . Hyperlipidemia Brother     Social History:  reports that she has never smoked. She has never used smokeless tobacco. She reports that she does not drink alcohol or use illicit drugs.  Allergies:  Allergies  Allergen Reactions  . Codeine Nausea And Vomiting  . Tramadol Nausea And Vomiting    Medications:   Medication List    ASK your doctor about these medications        acetaminophen 500 MG tablet  Commonly known as:  TYLENOL  Take 500 mg by mouth daily as needed for moderate pain.     ASPIRIN LOW DOSE 81 MG EC tablet  Generic drug:  aspirin  Take 81 mg by mouth daily.     CENTRUM SILVER PO  Take 1 tablet by mouth daily.     cholecalciferol 1000 units tablet  Commonly known as:  VITAMIN D  Take 1,000 Units by mouth daily. Reported on 04/24/2016     dexamethasone 4 MG tablet  Commonly known as:  DECADRON  5 tablets by mouth daily on days 1, 2, 4, 5, 8, 9, 11 and 12 every 3 weeks starting with the first dose of chemotherapy on 05/07/2016     dorzolamide-timolol 22.3-6.8 MG/ML ophthalmic solution  Commonly known as:  COSOPT  Place 1 drop into the left eye 2 (two) times daily.     gabapentin 100 MG capsule  Commonly known as:  NEURONTIN  Take 1 capsule by mouth 3  times daily     hyaluronate sodium Gel  Apply 1 application topically 2 (two) times daily.     IMODIUM A-D 2 MG tablet  Generic drug:  loperamide  Take 2 mg by mouth daily as needed for diarrhea or loose stools.     loratadine 10 MG tablet  Commonly known as:  CLARITIN  TAKE ONE TABLET BY MOUTH ONCE DAILY.     LUMIGAN 0.01 % Soln  Generic drug:  bimatoprost  Place 1 drop into both eyes at bedtime.     magic mouthwash w/lidocaine Soln  Take 5 mLs by mouth 4 (four) times daily as needed for mouth pain.     ondansetron 4 MG disintegrating tablet  Commonly known as:  ZOFRAN ODT  Take 1 tablet (4 mg total) by mouth every 8  (eight) hours as needed for nausea or vomiting.     ondansetron 4 MG tablet  Commonly known as:  ZOFRAN  Take 1 tablet (4 mg total) by mouth every 6 (six) hours.     ONE TOUCH ULTRA TEST test strip  Generic drug:  glucose blood  USE AS DIRECTED TO CHECK DAILY.     OSCAL 500/200  D-3 500-200 MG-UNIT tablet  Generic drug:  calcium-vitamin D  Take 1 tablet by mouth 3 (three) times daily.     oxyCODONE 5 MG immediate release tablet  Commonly known as:  ROXICODONE  Take 1 tablet (5 mg total) by mouth every 4 (four) hours as needed for severe pain.     oxyCODONE-acetaminophen 5-325 MG tablet  Commonly known as:  PERCOCET/ROXICET  Take 1 tablet by mouth every 8 (eight) hours as needed for severe pain.     pantoprazole 40 MG tablet  Commonly known as:  PROTONIX  TAKE 1 TABLET BY MOUTH  DAILY FOR ACID REFLUX     potassium chloride SA 20 MEQ tablet  Commonly known as:  K-DUR,KLOR-CON  Take 1 tablet by mouth  twice a day     PREMARIN vaginal cream  Generic drug:  conjugated estrogens  PLACE 1 APPLICATORFUL VAGINALLY TWICE A WEEK.     sucralfate 1 g tablet  Commonly known as:  CARAFATE  Take 1 tablet (1 g total) by mouth 4 (four) times daily -  with meals and at bedtime. Mix in 2 teaspoons of water to make a slurry and take 5-30 minutes before meals and hs     temazepam 15 MG capsule  Commonly known as:  RESTORIL  Take 1 capsule (15 mg total) by mouth at bedtime as needed for sleep.     valACYclovir 1000 MG tablet  Commonly known as:  VALTREX  Take 1 tablet by mouth  daily as needed     valsartan-hydrochlorothiazide 320-25 MG tablet  Commonly known as:  DIOVAN-HCT  Take 1 tablet by mouth  daily        Please HPI for pertinent positives, otherwise complete 10 system ROS negative.  Mallampati Score: MD Evaluation Airway: WNL Heart: WNL Abdomen: WNL Chest/ Lungs: WNL ASA  Classification: 3 Mallampati/Airway Score: Two  Physical Exam: BP 138/84 mmHg  Pulse 94   Temp(Src) 99.4 F (37.4 C) (Oral)  Resp 18  Ht '5\' 6"'  (1.676 m)  Wt 178 lb 10 oz (81.024 kg)  BMI 28.84 kg/m2  SpO2 100% Body mass index is 28.84 kg/(m^2). General: pleasant, obese black female who is laying in bed in NAD HEENT: head is normocephalic, atraumatic.  Sclera are noninjected.  PERRL.  Ears and nose without any masses or lesions.  Mouth is pink and moist Heart: regular, rate, and rhythm.  Normal s1,s2. No obvious murmurs, gallops, or rubs noted.  Palpable radial and pedal pulses bilaterally Lungs: CTAB, no wheezes, rhonchi, or rales noted.  Respiratory effort nonlabored Abd: soft, NT, obese, +BS, no masses, hernias, or organomegaly MS: all 4 extremities are symmetrical with no cyanosis, clubbing, or edema. Psych: A&Ox3 with an appropriate affect.   Labs: Results for orders placed or performed during the hospital encounter of 06/15/16 (from the past 48 hour(s))  APTT upon arrival     Status: Abnormal   Collection Time: 06/15/16  7:03 AM  Result Value Ref Range   aPTT 20 (L) 24 - 37 seconds  CBC upon arrival     Status: Abnormal   Collection Time: 06/15/16  7:03 AM  Result Value Ref Range   WBC 7.0 4.0 - 10.5 K/uL   RBC 3.50 (L) 3.87 - 5.11 MIL/uL   Hemoglobin 10.4 (L) 12.0 - 15.0 g/dL   HCT 31.9 (L) 36.0 - 46.0 %   MCV 91.1 78.0 - 100.0 fL   MCH 29.7 26.0 - 34.0 pg   MCHC 32.6 30.0 -  36.0 g/dL   RDW 21.7 (H) 11.5 - 15.5 %   Platelets 38 (L) 150 - 400 K/uL    Comment: REPEATED TO VERIFY SPECIMEN CHECKED FOR CLOTS PLATELET COUNT CONFIRMED BY SMEAR   Protime-INR upon arrival     Status: None   Collection Time: 06/15/16  7:03 AM  Result Value Ref Range   Prothrombin Time 14.8 11.6 - 15.2 seconds   INR 1.19 0.00 - 1.49   *Note: Due to a large number of results and/or encounters for the requested time period, some results have not been displayed. A complete set of results can be found in Results Review.    Imaging: No results found.  Assessment/Plan 1.  Thrombocytopenia, multiple myeloma -we will plan for a bone marrow biopsy today -labs and vitals have been reviewed -Risks and Benefits discussed with the patient including, but not limited to bleeding, infection, damage to adjacent structures or low yield requiring additional tests. All of the patient's questions were answered, patient is agreeable to proceed. Consent signed and in chart.   Thank you for this interesting consult.  I greatly enjoyed meeting Reeve P Reber and look forward to participating in their care.  A copy of this report was sent to the requesting provider on this date.  Electronically Signed: Henreitta Cea 06/15/2016, 8:42 AM   I spent a total of  30 Minutes  in face to face in clinical consultation, greater than 50% of which was counseling/coordinating care for thrombocytopenia

## 2016-06-15 NOTE — Discharge Instructions (Signed)
Bone Marrow Aspiration and Bone Marrow Biopsy °Bone marrow aspiration and bone marrow biopsy are procedures that are done to diagnose blood disorders. You may also have one of these procedures to help diagnose infections or some types of cancer. °Bone marrow is the soft tissue that is inside your bones. Blood cells are produced in bone marrow. For bone marrow aspiration, a sample of tissue in liquid form is removed from inside your bone. For a bone marrow biopsy, a small core of bone marrow tissue is removed. Then these samples are examined under a microscope or tested in a lab. °You may need these procedures if you have an abnormal complete blood count (CBC). The aspiration or biopsy sample is usually taken from the top of your hip bone. Sometimes, an aspiration sample is taken from your chest bone (sternum). °LET YOUR HEALTH CARE PROVIDER KNOW ABOUT: °· Any allergies you have. °· All medicines you are taking, including vitamins, herbs, eye drops, creams, and over-the-counter medicines. °· Previous problems you or members of your family have had with the use of anesthetics. °· Any blood disorders you have. °· Previous surgeries you have had. °· Any medical conditions you may have. °· Whether you are pregnant or you think that you may be pregnant. °RISKS AND COMPLICATIONS °Generally, this is a safe procedure. However, problems may occur, including: °· Infection. °· Bleeding. °BEFORE THE PROCEDURE °· Ask your health care provider about: °¨ Changing or stopping your regular medicines. This is especially important if you are taking diabetes medicines or blood thinners. °¨ Taking medicines such as aspirin and ibuprofen. These medicines can thin your blood. Do not take these medicines before your procedure if your health care provider instructs you not to. °· Plan to have someone take you home after the procedure. °· If you go home right after the procedure, plan to have someone with you for 24 hours. °PROCEDURE  °· An  IV tube may be inserted into one of your veins. °· The injection site will be cleaned with a germ-killing solution (antiseptic). °· You will be given one or more of the following: °¨ A medicine that helps you relax (sedative). °¨ A medicine that numbs the area (local anesthetic). °· The bone marrow sample will be removed as follows: °¨ For an aspiration, a hollow needle will be inserted through your skin and into your bone. Bone marrow fluid will be drawn up into a syringe. °¨ For a biopsy, your health care provider will use a hollow needle to remove a core of tissue from your bone marrow. °· The needle will be removed. °· A bandage (dressing) will be placed over the insertion site and taped in place. °The procedure may vary among health care providers and hospitals. °AFTER THE PROCEDURE °· Your blood pressure, heart rate, breathing rate, and blood oxygen level will be monitored often until the medicines you were given have worn off. °· Return to your normal activities as directed by your health care provider. °  °This information is not intended to replace advice given to you by your health care provider. Make sure you discuss any questions you have with your health care provider. °  °Document Released: 12/20/2004 Document Revised: 05/03/2015 Document Reviewed: 12/08/2014 °Elsevier Interactive Patient Education ©2016 [employer] ° °Bone Marrow Aspiration and Bone Marrow Biopsy, Care After °Refer to this sheet in the next few weeks. These instructions provide you with information about caring for yourself after your procedure. Your health care provider may also give   you more specific instructions. Your treatment has been planned according to current medical practices, but problems sometimes occur. Call your health care provider if you have any problems or questions after your procedure. °WHAT TO EXPECT AFTER THE PROCEDURE °After your procedure, it is common to have: °· Soreness or tenderness around the puncture  site. °· Bruising. °HOME CARE INSTRUCTIONS °· Take medicines only as directed by your health care provider. °· Follow your health care provider's instructions about: °¨ Puncture site care. °¨ Bandage (dressing) changes and removal. °· Bathe and shower as directed by your health care provider. °· Check your puncture site every day for signs of infection. Watch for: °¨ Redness, swelling, or pain. °¨ Fluid, blood, or pus. °· Return to your normal activities as directed by your health care provider. °· Keep all follow-up visits as directed by your health care provider. This is important. °SEEK MEDICAL CARE IF: °· You have a fever. °· You have uncontrollable bleeding. °· You have redness, swelling, or pain at the site of your puncture. °· You have fluid, blood, or pus coming from your puncture site. °  °This information is not intended to replace advice given to you by your health care provider. Make sure you discuss any questions you have with your health care provider. °  °Document Released: 07/06/2005 Document Revised: 05/03/2015 Document Reviewed: 12/08/2014 °Elsevier Interactive Patient Education ©2016 [employer] ° °Moderate Conscious Sedation, Adult, Care After °Refer to this sheet in the next few weeks. These instructions provide you with information on caring for yourself after your procedure. Your health care provider may also give you more specific instructions. Your treatment has been planned according to current medical practices, but problems sometimes occur. Call your health care provider if you have any problems or questions after your procedure. °WHAT TO EXPECT AFTER THE PROCEDURE  °After your procedure: °· You may feel sleepy, clumsy, and have poor balance for several hours. °· Vomiting may occur if you eat too soon after the procedure. °HOME CARE INSTRUCTIONS °· Do not participate in any activities where you could become injured for at least 24 hours. Do not: °¨ Drive. °¨ Swim. °¨ Ride a  bicycle. °¨ Operate heavy machinery. °¨ Cook. °¨ Use power tools. °¨ Climb ladders. °¨ Work from a high place. °· Do not make important decisions or sign legal documents until you are improved. °· If you vomit, drink water, juice, or soup when you can drink without vomiting. Make sure you have little or no nausea before eating solid foods. °· Only take over-the-counter or prescription medicines for pain, discomfort, or fever as directed by your health care provider. °· Make sure you and your family fully understand everything about the medicines given to you, including what side effects may occur. °· You should not drink alcohol, take sleeping pills, or take medicines that cause drowsiness for at least 24 hours. °· If you smoke, do not smoke without supervision. °· If you are feeling better, you may resume normal activities 24 hours after you were sedated. °· Keep all appointments with your health care provider. °SEEK MEDICAL CARE IF: °· Your skin is pale or bluish in color. °· You continue to feel nauseous or vomit. °· Your pain is getting worse and is not helped by medicine. °· You have bleeding or swelling. °· You are still sleepy or feeling clumsy after 24 hours. °SEEK IMMEDIATE MEDICAL CARE IF: °· You develop a rash. °· You have difficulty breathing. °· You develop any   type of allergic problem.  You have a fever. MAKE SURE YOU:  Understand these instructions.  Will watch your condition.  Will get help right away if you are not doing well or get worse.   This information is not intended to replace advice given to you by your health care provider. Make sure you discuss any questions you have with your health care provider.   Document Released: 10/07/2013 Document Revised: 01/07/2015 Document Reviewed: 10/07/2013 Elsevier Interactive Patient Education [employer].

## 2016-06-18 ENCOUNTER — Encounter: Payer: Self-pay | Admitting: Internal Medicine

## 2016-06-18 ENCOUNTER — Other Ambulatory Visit (HOSPITAL_BASED_OUTPATIENT_CLINIC_OR_DEPARTMENT_OTHER): Payer: Medicare Other

## 2016-06-18 ENCOUNTER — Ambulatory Visit (HOSPITAL_BASED_OUTPATIENT_CLINIC_OR_DEPARTMENT_OTHER): Payer: Medicare Other | Admitting: Internal Medicine

## 2016-06-18 ENCOUNTER — Telehealth: Payer: Self-pay | Admitting: Internal Medicine

## 2016-06-18 VITALS — BP 141/69 | HR 110 | Temp 98.9°F | Resp 18 | Ht 66.0 in | Wt 181.1 lb

## 2016-06-18 DIAGNOSIS — C9000 Multiple myeloma not having achieved remission: Secondary | ICD-10-CM

## 2016-06-18 DIAGNOSIS — C9002 Multiple myeloma in relapse: Secondary | ICD-10-CM | POA: Diagnosis not present

## 2016-06-18 DIAGNOSIS — D696 Thrombocytopenia, unspecified: Secondary | ICD-10-CM | POA: Diagnosis not present

## 2016-06-18 LAB — CBC WITH DIFFERENTIAL/PLATELET
BASO%: 0.3 % (ref 0.0–2.0)
BASOS ABS: 0 10*3/uL (ref 0.0–0.1)
EOS ABS: 0.1 10*3/uL (ref 0.0–0.5)
EOS%: 0.6 % (ref 0.0–7.0)
HEMATOCRIT: 32.3 % — AB (ref 34.8–46.6)
HEMOGLOBIN: 10.4 g/dL — AB (ref 11.6–15.9)
LYMPH#: 0.4 10*3/uL — AB (ref 0.9–3.3)
LYMPH%: 4.4 % — ABNORMAL LOW (ref 14.0–49.7)
MCH: 29.4 pg (ref 25.1–34.0)
MCHC: 32.2 g/dL (ref 31.5–36.0)
MCV: 91.4 fL (ref 79.5–101.0)
MONO#: 1.3 10*3/uL — AB (ref 0.1–0.9)
MONO%: 13.4 % (ref 0.0–14.0)
NEUT#: 7.6 10*3/uL — ABNORMAL HIGH (ref 1.5–6.5)
NEUT%: 81.3 % — AB (ref 38.4–76.8)
PLATELETS: 51 10*3/uL — AB (ref 145–400)
RBC: 3.53 10*6/uL — AB (ref 3.70–5.45)
RDW: 23.4 % — AB (ref 11.2–14.5)
WBC: 9.3 10*3/uL (ref 3.9–10.3)

## 2016-06-18 LAB — COMPREHENSIVE METABOLIC PANEL
ALT: 20 U/L (ref 0–55)
ANION GAP: 10 meq/L (ref 3–11)
AST: 22 U/L (ref 5–34)
Albumin: 2.8 g/dL — ABNORMAL LOW (ref 3.5–5.0)
Alkaline Phosphatase: 74 U/L (ref 40–150)
BUN: 13.4 mg/dL (ref 7.0–26.0)
CHLORIDE: 108 meq/L (ref 98–109)
CO2: 24 meq/L (ref 22–29)
CREATININE: 0.9 mg/dL (ref 0.6–1.1)
Calcium: 9.3 mg/dL (ref 8.4–10.4)
EGFR: 74 mL/min/{1.73_m2} — AB (ref >90)
Glucose: 129 mg/dl (ref 70–140)
Potassium: 4.3 mEq/L (ref 3.5–5.1)
Sodium: 142 mEq/L (ref 136–145)
Total Bilirubin: 1.3 mg/dL — ABNORMAL HIGH (ref 0.20–1.20)
Total Protein: 6.7 g/dL (ref 6.4–8.3)

## 2016-06-18 NOTE — Progress Notes (Signed)
[facility] [phone]   [phone]  OFFICE PROGRESS NOTE  Darlene Nakayama, MD [address], [address] [place] [address]  DIAGNOSIS: Stage IIA IgA kappa multiple myeloma diagnosed in 2006   PRIOR THERAPY:  1) status post treatment with thalidomide and Decadron followed by autologous peripheral blood stem cell transplant with high-dose melphalan on 04/26/2006 at [facility].  2) the patient had evidence for disease recurrence in August of 2008 and she was treated with 6 cycles of Velcade completed on 03/30/2008 with response to the treatment but was discontinued secondary to neuropathy.  3) maintenance treatment with Revlimid 10 mg by mouth daily by the does was later increase to 15 mg by mouth daily secondary to biochemical recurrence with stabilization of her disease. The treatment with Revlimid 15 mg by mouth daily was discontinued secondary to evidence for disease progression. 4) Revlimid 25 mg by mouth daily for 3 weeks every 4 weeks in addition to Decadron currently at 20 mg by mouth on a weekly basis. She is status post 12 cycles. This was discontinued secondary to disease progression.   CURRENT THERAPY:  1) systemic chemotherapy with Daratumumab 16 MG/KG weekly for the first 9 weeks, Velcade 1.3 MG/M2 subcutaneously on days 1, 4, 8 and 11 every 3 weeks in addition to Decadron 20 mg by mouth on days 1, 2, 4, 5, 8, 9, 11 and 12 every 3 weeks. First dose 05/07/2016. Status post one cycle discontinued secondary to intolerance. 2) Zometa 4 mg IV every 3 months. First dose in 05/05/2015  ADVANCED DIRECTIVE: She does not have advanced directive and was given information.  INTERVAL HISTORY: Darlene Howard 65 y.o. female returns to the clinic today for routine monthly followup visit accompanied by several family members. The patient is doing fine today with no specific complaints. The patient denied having any fever or chills. She denied  having any shortness of breath, cough or hemoptysis. She has no weight loss or night sweats. Her treatment has been on hold secondary to significant pancytopenia. She underwent a bone marrow biopsy and aspirate on 06/15/2016 but the results are still pending. She also received 2 units of PRBCs transfusion last week. She is here today for evaluation and discussion of her lab results.  MEDICAL HISTORY: Past Medical History  Diagnosis Date  . Glaucoma     uses eye drops as instructed  . Personal history of colon cancer     ASCENDING COLON--  S/P  RIGHT HEMICOLECTOMY WITH NEGATIVE NODES/    NO RECURRENCE  . Herpes     takes Valtrex as needed  . GERD (gastroesophageal reflux disease)     takes Pantoprazole daily  . Hypertension     takes Amlodipine and Diovan daily  . Bruises easily     d/t being on COumadin  . History of colon polyps   . Type 2 diabetes mellitus ([place])   . 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 H/O stem cell transplant ([place])     04-26-2006  AT [facility]  . Multiple myeloma DX  2006  STATE IIA,  IgA  [facility]    STABLE  PER DR Merrin Mcvicker--  S/P STEM CELL TRANSPLANT 2007 /  RECURRENCE 2008  CHEMO ENDED 03-30-2008/   NOW ON MAINTANANCE REVLIMID )  . Vocal cord nodule     resolved- see Dr. Deeann Saint notes in media tab 06/17/14  .  DDD (degenerative disc disease), lumbar   . Cholelithiasis   . Fatty liver   . Lung cancer ([place])     chest wall lesion aadn 6th rib    ALLERGIES:  is allergic to codeine and tramadol.  MEDICATIONS:  Current Outpatient Prescriptions  Medication Sig Dispense Refill  . acetaminophen (TYLENOL) 500 MG tablet Take 500 mg by mouth daily as needed for moderate pain.    Marland Kitchen aspirin (ASPIRIN LOW DOSE) 81 MG EC tablet Take 81 mg by mouth daily.      . calcium-vitamin D (OSCAL 500/200 D-3) 500-200 MG-UNIT per tablet Take 1 tablet by mouth 3 (three) times daily.      . cholecalciferol (VITAMIN D) 1000 UNITS  tablet Take 1,000 Units by mouth daily. Reported on 04/24/2016    . dexamethasone (DECADRON) 4 MG tablet 5 tablets by mouth daily on days 1, 2, 4, 5, 8, 9, 11 and 12 every 3 weeks starting with the first dose of chemotherapy on 05/07/2016 10 tablet 1  . dorzolamide-timolol (COSOPT) 22.3-6.8 MG/ML ophthalmic solution Place 1 drop into the left eye 2 (two) times daily.     Marland Kitchen gabapentin (NEURONTIN) 100 MG capsule Take 1 capsule by mouth 3  times daily 270 capsule 1  . hyaluronate sodium (RADIAPLEXRX) GEL Apply 1 application topically 2 (two) times daily.    Marland Kitchen loperamide (IMODIUM A-D) 2 MG tablet Take 2 mg by mouth daily as needed for diarrhea or loose stools.    Marland Kitchen loratadine (CLARITIN) 10 MG tablet TAKE ONE TABLET BY MOUTH ONCE DAILY. (Patient taking differently: TAKE 10 MG BY MOUTH ONCE DAILY.) 30 tablet 3  . LUMIGAN 0.01 % SOLN Place 1 drop into both eyes at bedtime.     . magic mouthwash w/lidocaine SOLN Take 5 mLs by mouth 4 (four) times daily as needed for mouth pain. 400 mL 0  . Multiple Vitamins-Minerals (CENTRUM SILVER PO) Take 1 tablet by mouth daily.     . ondansetron (ZOFRAN ODT) 4 MG disintegrating tablet Take 1 tablet (4 mg total) by mouth every 8 (eight) hours as needed for nausea or vomiting. 20 tablet 0  . ondansetron (ZOFRAN) 4 MG tablet Take 1 tablet (4 mg total) by mouth every 6 (six) hours. 10 tablet 0  . ONE TOUCH ULTRA TEST test strip USE AS DIRECTED TO CHECK DAILY. 50 each 3  . oxyCODONE (ROXICODONE) 5 MG immediate release tablet Take 1 tablet (5 mg total) by mouth every 4 (four) hours as needed for severe pain. 30 tablet 0  . oxyCODONE-acetaminophen (PERCOCET/ROXICET) 5-325 MG tablet Take 1 tablet by mouth every 8 (eight) hours as needed for severe pain. 40 tablet 0  . pantoprazole (PROTONIX) 40 MG tablet TAKE 1 TABLET BY MOUTH  DAILY FOR ACID REFLUX 90 tablet 1  . potassium chloride SA (K-DUR,KLOR-CON) 20 MEQ tablet Take 1 tablet by mouth  twice a day (Patient taking differently:  Take 20 meq by mouth twice daily) 180 tablet 0  . PREMARIN vaginal cream PLACE 1 APPLICATORFUL VAGINALLY TWICE A WEEK. 30 g 0  . sucralfate (CARAFATE) 1 g tablet Take 1 tablet (1 g total) by mouth 4 (four) times daily -  with meals and at bedtime. Mix in 2 teaspoons of water to make a slurry and take 5-30 minutes before meals and hs 120 tablet 0  . temazepam (RESTORIL) 15 MG capsule Take 1 capsule (15 mg total) by mouth at bedtime as needed for sleep. 30 capsule 1  . valACYclovir (  VALTREX) 1000 MG tablet Take 1 tablet by mouth  daily as needed (Patient taking differently: Take 1 tablet by mouth  daily as needed for outbreaks) 90 tablet 1  . valsartan-hydrochlorothiazide (DIOVAN-HCT) 320-25 MG tablet Take 1 tablet by mouth  daily 90 tablet 0   No current facility-administered medications for this visit.    SURGICAL HISTORY:  Past Surgical History  Procedure Laterality Date  . Colonoscopy  10/07/2006    [facility]:XNATFT rectum/Status post right hemicolectomy. Normal residual colon  . Cervical cone biopsy    . Cataract extraction w/phaco Left 01/20/2014    Procedure: CATARACT EXTRACTION PHACO AND INTRAOCULAR LENS PLACEMENT (IOC);  Surgeon: Adonis Brook, MD;  Location: [facility];  Service: Ophthalmology;  Laterality: Left;  . 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 Co2  ablation vulva perianal and vaginal dysplatic areas  02-15-2010  . Abdominal hysterectomy  1983    w/  appendectomy and left salpingoophorectom  . Hemicolectomy Right 04-27-2005    CARCINOMA   ASCENDING COLON  . Vulvectomy N/A 05/06/2014    Procedure: WIDE LOCAL EXCISION LEFT LABIA MAJORA;  Surgeon: Janie Morning, MD;  Location: [facility];  Service: Gynecology;  Laterality: N/A;  . Vulvar lesion removal N/A 05/06/2014    Procedure: LASER OF VULVAR LESION;  Surgeon: Janie Morning, MD;  Location: [facility];  Service: Gynecology;  Laterality: N/A;  . Lesion removal N/A 05/06/2014    Procedure:  LASER OF THE VAGINA;  Surgeon: Janie Morning, MD;  Location: [facility];  Service: Gynecology;  Laterality: N/A;  . Esophagogastroduodenoscopy  04/02/2005    [facility]:[id number] polyp with central erosion versus ulceration in the body of the stomach, resected and recovered Remainder of the gastric mucosa, D1, D2 appeared normal  . Colonoscopy  10/2009    RMR: diminutive RS polyp (hyperplastic).   . Colonoscopy N/A 12/15/2014    Procedure: COLONOSCOPY;  Surgeon: Daneil Dolin, MD;  [facility];  Service: Endoscopy;  Laterality: N/A;  130   . Cholecystectomy N/A 02/28/2016    Procedure: LAPAROSCOPIC CHOLECYSTECTOMY;  Surgeon: Ralene Ok, MD;  Location: [facility];  Service: General;  Laterality: N/A;    REVIEW OF SYSTEMS:  A comprehensive review of systems was negative except for: Constitutional: positive for fatigue   PHYSICAL EXAMINATION: General appearance: alert, cooperative and no distress Head: Normocephalic, without obvious abnormality, atraumatic Neck: no adenopathy Lymph nodes: Cervical, supraclavicular, and axillary nodes normal. Resp: clear to auscultation bilaterally Back: symmetric, no curvature. ROM normal. No CVA tenderness. Cardio: regular rate and rhythm, S1, S2 normal, no murmur, click, rub or gallop GI: soft, non-tender; bowel sounds normal; no masses,  no organomegaly Extremities: extremities normal, atraumatic, no cyanosis or edema Neurologic: Alert and oriented X 3, normal strength and tone. Normal symmetric reflexes. Normal coordination and gait  ECOG PERFORMANCE STATUS: 1 - Symptomatic but completely ambulatory  Blood pressure 141/69, pulse 110, temperature 98.9 F (37.2 C), temperature source Oral, resp. rate 18, height '5\' 6"'  (1.676 m), weight 181 lb 1.6 oz (82.146 kg), SpO2 98 %.  LABORATORY DATA: Lab Results  Component Value Date   WBC 9.3 06/18/2016   HGB 10.4* 06/18/2016   HCT 32.3* 06/18/2016   MCV 91.4 06/18/2016   PLT  51* 06/18/2016      Chemistry      Component Value Date/Time   NA 142 06/18/2016 0757   NA 138 05/19/2016 0823   K 4.3 06/18/2016 0757  K 3.7 05/19/2016 0823   CL 106 05/19/2016 0823   CL 105 06/17/2013 1441   CO2 24 06/18/2016 0757   CO2 21 05/19/2016 0823   BUN 13.4 06/18/2016 0757   BUN 12 05/19/2016 0823   CREATININE 0.9 06/18/2016 0757   CREATININE 0.77 05/19/2016 0823   CREATININE 0.93 04/24/2016 2003      Component Value Date/Time   CALCIUM 9.3 06/18/2016 0757   CALCIUM 8.8 05/19/2016 0823   ALKPHOS 74 06/18/2016 0757   ALKPHOS 58 04/24/2016 2003   AST 22 06/18/2016 0757   AST 14* 04/24/2016 2003   ALT 20 06/18/2016 0757   ALT 14 04/24/2016 2003   BILITOT 1.30* 06/18/2016 0757   BILITOT 1.3* 04/24/2016 2003       RADIOGRAPHIC STUDIES: Ct Biopsy  06/15/2016  INDICATION: Multiple myeloma EXAM: CT BIOPSY MEDICATIONS: None. ANESTHESIA/SEDATION: Moderate (conscious) sedation was employed during this procedure. A total of Versed for mg and Fentanyl 50 mcg was administered intravenously. Moderate Sedation Time: 16 minutes. The patient's level of consciousness and vital signs were monitored continuously by radiology nursing throughout the procedure under my direct supervision. FLUOROSCOPY TIME:  Fluoroscopy Time:  minutes  seconds ( mGy). COMPLICATIONS: None immediate. PROCEDURE: Informed written consent was obtained from the patient after a thorough discussion of the procedural risks, benefits and alternatives. All questions were addressed. Maximal Sterile Barrier Technique was utilized including caps, mask, sterile gowns, sterile gloves, sterile drape, hand hygiene and skin antiseptic. A timeout was performed prior to the initiation of the procedure. 1% lidocaine was utilized for local infiltration. Under CT guidance, an 11 gauge needle was advanced into the right iliac bone via posterior approach. Aspirates were obtained. A core was obtained. FINDINGS: Images document needle  placement in the right iliac bone. Post biopsy images demonstrate no evidence of hemorrhage. IMPRESSION: Successful right iliac bone marrow aspirate and core. Electronically Signed   By: Marybelle Killings M.D.   On: 06/15/2016 11:31    ASSESSMENT AND PLAN: This is a very pleasant 65 years old [place] American female with history of multiple myeloma currently on treatment with Revlimid and low-dose Decadron and tolerated her treatment fairly well. The patient is doing fine today with no specific complaints except for mild fatigue.  Because of disease progression, I recommended for her to continue her treatment with Revlimid and Decadron but I increased the dose of Revlimid to 25 mg by mouth daily for 21 days every 4 weeks, status post 12 months of this regimen. The recent myeloma panel performed at [facility] showed evidence for disease progression. The patient also has enlarging right-sided chest wall lesion. She underwent palliative radiotherapy to the enlarging right-posterior seventh rib lesion. She was treated with 1 cycle of chemotherapy with Daratumumab, Velcade and Decadron but this was discontinued secondary 20 rd and pancytopenia. The results of the bone marrow biopsy are still pending. CBC today showed improvement of her hemoglobin and hematocrit as well as platelets count. I recommended for the patient to come back for follow-up visit in one week for reevaluation and discussion of her biopsy results and further recommendation regarding her condition. The patient will continue her treatment with Zometa every 3 months as a scheduled. She was advised to call immediately if she has any concerning symptoms and interval. The patient voices understanding of current disease status and treatment options and is in agreement with the current care plan.  All questions were answered. The patient knows to call the clinic with any problems,  questions or concerns. We can certainly see the patient much  sooner if necessary.  Disclaimer: This note was dictated with voice recognition software. Similar sounding words can inadvertently be transcribed and may not be corrected upon review.

## 2016-06-18 NOTE — Telephone Encounter (Signed)
Gave and printed appt sched and avs for pt for June and July  °

## 2016-06-19 ENCOUNTER — Ambulatory Visit: Payer: Medicare Other | Admitting: Gastroenterology

## 2016-06-22 ENCOUNTER — Other Ambulatory Visit: Payer: Self-pay | Admitting: Medical Oncology

## 2016-06-22 DIAGNOSIS — C9000 Multiple myeloma not having achieved remission: Secondary | ICD-10-CM

## 2016-06-25 ENCOUNTER — Telehealth: Payer: Self-pay | Admitting: Nurse Practitioner

## 2016-06-25 ENCOUNTER — Encounter: Payer: Self-pay | Admitting: Nurse Practitioner

## 2016-06-25 ENCOUNTER — Telehealth: Payer: Self-pay | Admitting: Internal Medicine

## 2016-06-25 ENCOUNTER — Other Ambulatory Visit (HOSPITAL_BASED_OUTPATIENT_CLINIC_OR_DEPARTMENT_OTHER): Payer: Medicare Other

## 2016-06-25 ENCOUNTER — Ambulatory Visit (HOSPITAL_BASED_OUTPATIENT_CLINIC_OR_DEPARTMENT_OTHER): Payer: Medicare Other

## 2016-06-25 ENCOUNTER — Ambulatory Visit (HOSPITAL_BASED_OUTPATIENT_CLINIC_OR_DEPARTMENT_OTHER): Payer: Medicare Other | Admitting: Nurse Practitioner

## 2016-06-25 VITALS — BP 122/74 | HR 107

## 2016-06-25 VITALS — BP 120/39 | HR 116 | Temp 98.5°F | Resp 18 | Ht 66.0 in | Wt 176.9 lb

## 2016-06-25 DIAGNOSIS — C9000 Multiple myeloma not having achieved remission: Secondary | ICD-10-CM

## 2016-06-25 DIAGNOSIS — C9002 Multiple myeloma in relapse: Secondary | ICD-10-CM | POA: Diagnosis not present

## 2016-06-25 DIAGNOSIS — Z5112 Encounter for antineoplastic immunotherapy: Secondary | ICD-10-CM

## 2016-06-25 DIAGNOSIS — D6959 Other secondary thrombocytopenia: Secondary | ICD-10-CM | POA: Diagnosis not present

## 2016-06-25 LAB — COMPREHENSIVE METABOLIC PANEL
ALBUMIN: 2.7 g/dL — AB (ref 3.5–5.0)
ALK PHOS: 76 U/L (ref 40–150)
ALT: 21 U/L (ref 0–55)
AST: 25 U/L (ref 5–34)
Anion Gap: 11 mEq/L (ref 3–11)
BUN: 14.1 mg/dL (ref 7.0–26.0)
CALCIUM: 9.5 mg/dL (ref 8.4–10.4)
CHLORIDE: 105 meq/L (ref 98–109)
CO2: 26 mEq/L (ref 22–29)
Creatinine: 1 mg/dL (ref 0.6–1.1)
EGFR: 70 mL/min/{1.73_m2} — AB (ref >90)
Glucose: 131 mg/dl (ref 70–140)
POTASSIUM: 4.1 meq/L (ref 3.5–5.1)
SODIUM: 141 meq/L (ref 136–145)
Total Bilirubin: 1.03 mg/dL (ref 0.20–1.20)
Total Protein: 7.4 g/dL (ref 6.4–8.3)

## 2016-06-25 LAB — CBC WITH DIFFERENTIAL/PLATELET
BASO%: 0.2 % (ref 0.0–2.0)
BASOS ABS: 0 10*3/uL (ref 0.0–0.1)
EOS%: 1.1 % (ref 0.0–7.0)
Eosinophils Absolute: 0.1 10*3/uL (ref 0.0–0.5)
HEMATOCRIT: 30 % — AB (ref 34.8–46.6)
HGB: 9.5 g/dL — ABNORMAL LOW (ref 11.6–15.9)
LYMPH%: 11.5 % — AB (ref 14.0–49.7)
MCH: 29.2 pg (ref 25.1–34.0)
MCHC: 31.7 g/dL (ref 31.5–36.0)
MCV: 92.3 fL (ref 79.5–101.0)
MONO#: 1 10*3/uL — ABNORMAL HIGH (ref 0.1–0.9)
MONO%: 10.7 % (ref 0.0–14.0)
NEUT#: 6.9 10*3/uL — ABNORMAL HIGH (ref 1.5–6.5)
NEUT%: 76.5 % (ref 38.4–76.8)
Platelets: 59 10*3/uL — ABNORMAL LOW (ref 145–400)
RBC: 3.25 10*6/uL — ABNORMAL LOW (ref 3.70–5.45)
RDW: 21.6 % — ABNORMAL HIGH (ref 11.2–14.5)
WBC: 9 10*3/uL (ref 3.9–10.3)
lymph#: 1 10*3/uL (ref 0.9–3.3)
nRBC: 1 % — ABNORMAL HIGH (ref 0–0)

## 2016-06-25 MED ORDER — BORTEZOMIB CHEMO SQ INJECTION 3.5 MG (2.5MG/ML)
1.3000 mg/m2 | Freq: Once | INTRAMUSCULAR | Status: AC
  Administered 2016-06-25: 2.5 mg via SUBCUTANEOUS
  Filled 2016-06-25: qty 2.5

## 2016-06-25 MED ORDER — PROCHLORPERAZINE MALEATE 10 MG PO TABS
10.0000 mg | ORAL_TABLET | Freq: Once | ORAL | Status: AC
  Administered 2016-06-25: 10 mg via ORAL

## 2016-06-25 MED ORDER — DEXAMETHASONE 4 MG PO TABS: ORAL_TABLET | ORAL | Status: DC

## 2016-06-25 MED ORDER — PROCHLORPERAZINE MALEATE 10 MG PO TABS
ORAL_TABLET | ORAL | Status: AC
  Filled 2016-06-25: qty 1

## 2016-06-25 NOTE — Patient Instructions (Signed)
[facility] Discharge Instructions for Patients Receiving Chemotherapy  Today you received the following chemotherapy agents Velcade  To help prevent nausea and vomiting after your treatment, we encourage you to take your nausea medication    If you develop nausea and vomiting that is not controlled by your nausea medication, call the clinic.   BELOW ARE SYMPTOMS THAT SHOULD BE REPORTED IMMEDIATELY:  *FEVER GREATER THAN 100.5 F  *CHILLS WITH OR WITHOUT FEVER  NAUSEA AND VOMITING THAT IS NOT CONTROLLED WITH YOUR NAUSEA MEDICATION  *UNUSUAL SHORTNESS OF BREATH  *UNUSUAL BRUISING OR BLEEDING  TENDERNESS IN MOUTH AND THROAT WITH OR WITHOUT PRESENCE OF ULCERS  *URINARY PROBLEMS  *BOWEL PROBLEMS  UNUSUAL RASH Items with * indicate a potential emergency and should be followed up as soon as possible.  Feel free to call the clinic you have any questions or concerns. The clinic phone number is [phone].  Please show the CHEMO ALERT CARD at check-in to the Emergency Department and triage nurse.   

## 2016-06-25 NOTE — Progress Notes (Signed)
Per Selena Lesser NP, okay to tx with platelets 59 per Dr. [name].

## 2016-06-25 NOTE — Telephone Encounter (Signed)
Gave pt cal & avs °

## 2016-06-25 NOTE — Progress Notes (Signed)
HPI:  Darlene Howard 65 y.o. female diagnosed with multiple myeloma.  Here today for follow-up.   Patient presents today for follow-up and review of most recent bone marrow pathology report.  She states that she's been doing fairly well; but is anxious to have the results.  She denies any new symptoms; and she also denies any recent fevers or chills.  Bone marrow pathology report revealed:  Bone Marrow, Aspirate,Biopsy, and Clot - PLASMA CELL MYELOMA (20% PLASMA CELLS), CLINICALLY PERSISTENT. - MILD MYELOFIBROSIS. - SEE COMMENT.  Labs obtained today reveal a WBC of 9.0, ANC 6.9, hemoglobin 9.5, platelet count 59.  Dr. [name] in to review all results with the patient and discuss treatment options.  Decision was made to start weekly Velcade injections today.  Also, patient was instructed to increase her dexamethasone from 20 mg once weekly up to 40 mg once weekly.  Patient will proceed today with cycle one of the weekly Velcade injections.  Patient was able to take dexamethasone 40 mg orally from her on dexamethasone medication she had in her purse.  Patient will continue with weekly labs and weekly Velcade injections.  She is scheduled for follow-up in radiation oncology on Wednesday, 06/27/2016.  She will also have a follow-up visit with Dr. [name] on 07/09/2016.   No history exists.    Review of Systems  Skin:       Scattered bruising  All other systems reviewed and are negative.   Past Medical History  Diagnosis Date  . Glaucoma     uses eye drops as instructed  . Personal history of colon cancer     ASCENDING COLON--  S/P  RIGHT HEMICOLECTOMY WITH NEGATIVE NODES/    NO RECURRENCE  . Herpes     takes Valtrex as needed  . GERD (gastroesophageal reflux disease)     takes Pantoprazole daily  . Hypertension     takes Amlodipine and Diovan daily  . Bruises easily     d/t being on COumadin  . History of colon polyps   . Type 2 diabetes mellitus ([place])   . Neuropathy  associated with multiple myeloma (HCC)     POST CHEMOTHERAPY AND STEM CELL TRANSPLANT  . H/O stem cell transplant ([place])     04-26-2006  AT [facility]  . Multiple myeloma DX  2006  STATE IIA,  IgA  K[place]    STABLE  PER DR MOHAMED--  S/P STEM CELL TRANSPLANT 2007 /  RECURRENCE 2008  CHEMO ENDED 03-30-2008/   NOW ON MAINTANANCE REVLIMID )  . Vocal cord nodule     resolved- see Dr. Deeann Saint notes in media tab 06/17/14  . DDD (degenerative disc disease), lumbar   . Cholelithiasis   . Fatty liver   . Lung cancer ([place])     chest wall lesion aadn 6th rib    Past Surgical History  Procedure Laterality Date  . Colonoscopy  10/07/2006    MPN:TIRWER rectum/Status post right hemicolectomy. Normal residual colon  . Cervical cone biopsy    . Cataract extraction w/phaco Left 01/20/2014    Procedure: CATARACT EXTRACTION PHACO AND INTRAOCULAR LENS PLACEMENT (IOC);  Surgeon: Adonis Brook, MD;  Location: [name];  Service: Ophthalmology;  Laterality: Left;  . 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 Co2  ablation vulva perianal and vaginal dysplatic areas  02-15-2010  . Abdominal hysterectomy  1983    w/  appendectomy and left salpingoophorectom  . Hemicolectomy Right 04-27-2005  CARCINOMA   ASCENDING COLON  . Vulvectomy N/A 05/06/2014    Procedure: WIDE LOCAL EXCISION LEFT LABIA MAJORA;  Surgeon: Janie Morning, MD;  Location: [facility];  Service: Gynecology;  Laterality: N/A;  . Vulvar lesion removal N/A 05/06/2014    Procedure: LASER OF VULVAR LESION;  Surgeon: Janie Morning, MD;  Location: [facility];  Service: Gynecology;  Laterality: N/A;  . Lesion removal N/A 05/06/2014    Procedure: LASER OF THE VAGINA;  Surgeon: Janie Morning, MD;  Location: [facility];  Service: Gynecology;  Laterality: N/A;  . Esophagogastroduodenoscopy  04/02/2005    LRJ:PVGKKD/PTELMRAJHHID polyp with central erosion versus ulceration in the body of the stomach, resected and  recovered Remainder of the gastric mucosa, D1, D2 appeared normal  . Colonoscopy  10/2009    RMR: diminutive RS polyp (hyperplastic).   . Colonoscopy N/A 12/15/2014    Procedure: COLONOSCOPY;  Surgeon: Daneil Dolin, MD;  [facility];  Service: Endoscopy;  Laterality: N/A;  130   . Cholecystectomy N/A 02/28/2016    Procedure: LAPAROSCOPIC CHOLECYSTECTOMY;  Surgeon: Ralene Ok, MD;  Location: [place];  Service: General;  Laterality: N/A;    has Multiple myeloma ([place]); Carcinoma in situ of colon; Diabetes mellitus type 2 in obese ([place]); Glaucoma; Essential hypertension; Allergic rhinitis; DISORDER OF BONE AND CARTILAGE UNSPECIFIED; Back pain with left-sided radiculopathy; Goiter; Abnormal LFTs; VAIN III (vaginal intraepithelial neoplasia grade III); VIN III (vulvar intraepithelial neoplasia III); GERD (gastroesophageal reflux disease); Fatty liver; H/O colon cancer, stage I; Multiple myeloma not having achieved remission ([place]); Thrombocytopenia ([place]); Insomnia; and Loose stools on her problem list.    is allergic to codeine and tramadol.    Medication List       This list is accurate as of: 06/25/16 10:47 AM.  Always use your most recent med list.               acetaminophen 500 MG tablet  Commonly known as:  TYLENOL  Take 500 mg by mouth daily as needed for moderate pain. Reported on 06/25/2016     ASPIRIN LOW DOSE 81 MG EC tablet  Generic drug:  aspirin  Take 81 mg by mouth daily.     CENTRUM SILVER PO  Take 1 tablet by mouth daily.     cholecalciferol 1000 units tablet  Commonly known as:  VITAMIN D  Take 1,000 Units by mouth daily. Reported on 04/24/2016     dexamethasone 4 MG tablet  Commonly known as:  DECADRON  Take 10 tabs (40 mg) once weekly while undergoing Velcade injections.     dorzolamide-timolol 22.3-6.8 MG/ML ophthalmic solution  Commonly known as:  COSOPT  Place 1 drop into the left eye 2 (two) times daily.     gabapentin 100 MG capsule    Commonly known as:  NEURONTIN  Take 1 capsule by mouth 3  times daily     hyaluronate sodium Gel  Apply 1 application topically 2 (two) times daily. Reported on 06/25/2016     IMODIUM A-D 2 MG tablet  Generic drug:  loperamide  Take 2 mg by mouth daily as needed for diarrhea or loose stools. Reported on 06/25/2016     loratadine 10 MG tablet  Commonly known as:  CLARITIN  TAKE ONE TABLET BY MOUTH ONCE DAILY.     LUMIGAN 0.01 % Soln  Generic drug:  bimatoprost  Place 1 drop into both eyes at bedtime.     magic mouthwash  w/lidocaine Soln  Take 5 mLs by mouth 4 (four) times daily as needed for mouth pain.     ondansetron 4 MG disintegrating tablet  Commonly known as:  ZOFRAN ODT  Take 1 tablet (4 mg total) by mouth every 8 (eight) hours as needed for nausea or vomiting.     ondansetron 4 MG tablet  Commonly known as:  ZOFRAN  Take 1 tablet (4 mg total) by mouth every 6 (six) hours.     ONE TOUCH ULTRA TEST test strip  Generic drug:  glucose blood  USE AS DIRECTED TO CHECK DAILY.     OSCAL 500/200 D-3 500-200 MG-UNIT tablet  Generic drug:  calcium-vitamin D  Take 1 tablet by mouth 3 (three) times daily.     oxyCODONE 5 MG immediate release tablet  Commonly known as:  ROXICODONE  Take 1 tablet (5 mg total) by mouth every 4 (four) hours as needed for severe pain.     oxyCODONE-acetaminophen 5-325 MG tablet  Commonly known as:  PERCOCET/ROXICET  Take 1 tablet by mouth every 8 (eight) hours as needed for severe pain.     pantoprazole 40 MG tablet  Commonly known as:  PROTONIX  TAKE 1 TABLET BY MOUTH  DAILY FOR ACID REFLUX     potassium chloride SA 20 MEQ tablet  Commonly known as:  K-DUR,KLOR-CON  Take 1 tablet by mouth  twice a day     PREMARIN vaginal cream  Generic drug:  conjugated estrogens  PLACE 1 APPLICATORFUL VAGINALLY TWICE A WEEK.     sucralfate 1 g tablet  Commonly known as:  CARAFATE  Take 1 tablet (1 g total) by mouth 4 (four) times daily -  with meals  and at bedtime. Mix in 2 teaspoons of water to make a slurry and take 5-30 minutes before meals and hs     temazepam 15 MG capsule  Commonly known as:  RESTORIL  Take 1 capsule (15 mg total) by mouth at bedtime as needed for sleep.     valACYclovir 1000 MG tablet  Commonly known as:  VALTREX  Take 1 tablet by mouth  daily as needed     valsartan-hydrochlorothiazide 320-25 MG tablet  Commonly known as:  DIOVAN-HCT  Take 1 tablet by mouth  daily         PHYSICAL EXAMINATION  Oncology Vitals 06/25/2016 06/25/2016  Height - 168 cm  Weight - 80.241 kg  Weight (lbs) - 176 lbs 14 oz  BMI (kg/m2) - 28.55 kg/m2  Temp - 98.5  Pulse 107 116  Resp - 18  SpO2 - 97  BSA (m2) - 1.93 m2   BP Readings from Last 2 Encounters:  06/25/16 122/74  06/25/16 120/39    Physical Exam  Constitutional: She is oriented to person, place, and time and well-developed, well-nourished, and in no distress.  HENT:  Head: Normocephalic and atraumatic.  Eyes: Conjunctivae and EOM are normal. Pupils are equal, round, and reactive to light. Right eye exhibits no discharge. Left eye exhibits no discharge. No scleral icterus.  Neck: Normal range of motion.  Pulmonary/Chest: Effort normal. No respiratory distress.  Musculoskeletal: Normal range of motion.  Neurological: She is alert and oriented to person, place, and time. Gait normal.  Psychiatric: Affect normal.  Nursing note and vitals reviewed.   LABORATORY DATA:. Appointment on 06/25/2016  Component Date Value Ref Range Status  . WBC 06/25/2016 9.0  3.9 - 10.3 10e3/uL Final  . NEUT# 06/25/2016 6.9* 1.5 - 6.5 10e3/uL  Final  . HGB 06/25/2016 9.5* 11.6 - 15.9 g/dL Final  . HCT 06/25/2016 30.0* 34.8 - 46.6 % Final  . Platelets 06/25/2016 59* 145 - 400 10e3/uL Final  . MCV 06/25/2016 92.3  79.5 - 101.0 fL Final  . MCH 06/25/2016 29.2  25.1 - 34.0 pg Final  . MCHC 06/25/2016 31.7  31.5 - 36.0 g/dL Final  . RBC 06/25/2016 3.25* 3.70 - 5.45 10e6/uL Final    . RDW 06/25/2016 21.6* 11.2 - 14.5 % Final  . lymph# 06/25/2016 1.0  0.9 - 3.3 10e3/uL Final  . MONO# 06/25/2016 1.0* 0.1 - 0.9 10e3/uL Final  . Eosinophils Absolute 06/25/2016 0.1  0.0 - 0.5 10e3/uL Final  . Basophils Absolute 06/25/2016 0.0  0.0 - 0.1 10e3/uL Final  . NEUT% 06/25/2016 76.5  38.4 - 76.8 % Final  . LYMPH% 06/25/2016 11.5* 14.0 - 49.7 % Final  . MONO% 06/25/2016 10.7  0.0 - 14.0 % Final  . EOS% 06/25/2016 1.1  0.0 - 7.0 % Final  . BASO% 06/25/2016 0.2  0.0 - 2.0 % Final  . nRBC 06/25/2016 1* 0 - 0 % Final  . Sodium 06/25/2016 141  136 - 145 mEq/L Final  . Potassium 06/25/2016 4.1  3.5 - 5.1 mEq/L Final  . Chloride 06/25/2016 105  98 - 109 mEq/L Final  . CO2 06/25/2016 26  22 - 29 mEq/L Final  . Glucose 06/25/2016 131  70 - 140 mg/dl Final   Glucose reference range is for nonfasting patients. Fasting glucose reference range is 70- 100.  Marland Kitchen BUN 06/25/2016 14.1  7.0 - 26.0 mg/dL Final  . Creatinine 06/25/2016 1.0  0.6 - 1.1 mg/dL Final  . Total Bilirubin 06/25/2016 1.03  0.20 - 1.20 mg/dL Final  . Alkaline Phosphatase 06/25/2016 76  40 - 150 U/L Final  . AST 06/25/2016 25  5 - 34 U/L Final  . ALT 06/25/2016 21  0 - 55 U/L Final  . Total Protein 06/25/2016 7.4  6.4 - 8.3 g/dL Final  . Albumin 06/25/2016 2.7* 3.5 - 5.0 g/dL Final  . Calcium 06/25/2016 9.5  8.4 - 10.4 mg/dL Final  . Anion Gap 06/25/2016 11  3 - 11 mEq/L Final  . EGFR 06/25/2016 70* >90 ml/min/1.73 m2 Final   eGFR is calculated using the CKD-EPI Creatinine Equation (2009)    RADIOGRAPHIC STUDIES: No results found.  ASSESSMENT/PLAN:    Multiple myeloma [facility]) Patient presents today for follow-up and review of most recent bone marrow pathology report.  She states that she's been doing fairly well; but is anxious to have the results.  She denies any new symptoms; and she also denies any recent fevers or chills.  Bone marrow pathology report revealed:  Bone Marrow, Aspirate,Biopsy, and Clot - PLASMA CELL  MYELOMA (20% PLASMA CELLS), CLINICALLY PERSISTENT. - MILD MYELOFIBROSIS. - SEE COMMENT.  Labs obtained today reveal a WBC of 9.0, ANC 6.9, hemoglobin 9.5, platelet count 59.  Dr. [name] in to review all results with the patient and discuss treatment options.  Decision was made to start weekly Velcade injections today.  Also, patient was instructed to increase her dexamethasone from 20 mg once weekly up to 40 mg once weekly.  Patient will proceed today with cycle one of the weekly Velcade injections.  Patient was able to take dexamethasone 40 mg orally from her on dexamethasone medication she had in her purse.  Patient will continue with weekly labs and weekly Velcade injections.  She is scheduled for follow-up in  radiation oncology on Wednesday, 06/27/2016.  She will also have a follow-up visit with Dr. [name] on 07/09/2016.   Patient stated understanding of all instructions; and was in agreement with this plan of care. The patient knows to call the clinic with any problems, questions or concerns.   Total time spent with patient was 25 minutes;  with greater than 75 percent of that time spent in face to face counseling regarding patient's symptoms,  and coordination of care and follow up.  Disclaimer:This dictation was prepared with Dragon/digital dictation along with [employer]. Any transcriptional errors that result from this process are unintentional.  Drue Second, NP 06/25/2016   ADDENDUM: Hematology/Oncology Attending: I had a face to face encounter with the patient. I recommended her care plan. This is a very pleasant 65 years old African-American female with relapsed multiple myeloma was started on treatment with Daratumumab, Velcade and Decadron status post 1 cycle but this was discontinued secondary to intolerance and severe thrombocytopenia. Recent bone marrow biopsy and aspirate was performed to rule out disease progression and it showed plasma cell count of 20%  which is very similar to that the patient had over the last few years. I sent her severe thrombocytopenia could be related to the treatment with Daratumumab which was discontinued. I recommended for the patient to resume her systemic treatment but was only subcutaneous Velcade 1.3 MG/M2 weekly in addition to Decadron 40 mg on a weekly basis. The patient will proceed with her treatment today despite the low platelet count. We will continue to monitor her platelets count closely on weekly lab work. She would come back for follow-up visit in 2 weeks for evaluation and management of any adverse effect of her treatment. The patient was advised to call immediately if she has any concerning symptoms in the interval.  Disclaimer: This note was dictated with voice recognition software. Similar sounding words can inadvertently be transcribed and may be missed upon review. Eilleen Kempf., MD 06/27/2016

## 2016-06-25 NOTE — Telephone Encounter (Signed)
Pt will get updated sched in tx room °

## 2016-06-25 NOTE — Assessment & Plan Note (Signed)
Patient presents today for follow-up and review of most recent bone marrow pathology report.  She states that she's been doing fairly well; but is anxious to have the results.  She denies any new symptoms; and she also denies any recent fevers or chills.  Bone marrow pathology report revealed:  Bone Marrow, Aspirate,Biopsy, and Clot - PLASMA CELL MYELOMA (20% PLASMA CELLS), CLINICALLY PERSISTENT. - MILD MYELOFIBROSIS. - SEE COMMENT.  Labs obtained today reveal a WBC of 9.0, ANC 6.9, hemoglobin 9.5, platelet count 59.  Dr. [name] in to review all results with the patient and discuss treatment options.  Decision was made to start weekly Velcade injections today.  Also, patient was instructed to increase her dexamethasone from 20 mg once weekly up to 40 mg once weekly.  Patient will proceed today with cycle one of the weekly Velcade injections.  Patient was able to take dexamethasone 40 mg orally from her on dexamethasone medication she had in her purse.  Patient will continue with weekly labs and weekly Velcade injections.  She is scheduled for follow-up in radiation oncology on Wednesday, 06/27/2016.  She will also have a follow-up visit with Dr. [name] on 07/09/2016.

## 2016-06-25 NOTE — Telephone Encounter (Signed)
spoke w/ pt confirmed 7/10 apt times

## 2016-06-26 ENCOUNTER — Other Ambulatory Visit: Payer: Self-pay | Admitting: Family Medicine

## 2016-06-26 NOTE — Progress Notes (Signed)
  Radiation Oncology         ([phone]) [phone] ________________________________  Name: Darlene Howard MRN: [id number]  Date: 05/23/2016  DOB: 09/11/1951  End of Treatment Note  Diagnosis:   Multiple myeloma     Indication for treatment::  palliative       Radiation treatment dates:   05/10/2016 through 05/23/2016  Site/dose:   The patient received 30 gray in 10 fractions to a lesion within the right sixth rib  Narrative: The patient tolerated radiation treatment relatively well.     Plan: The patient has completed radiation treatment. The patient will return to radiation oncology clinic for routine followup in one month. I advised the patient to call or return sooner if they have any questions or concerns related to their recovery or treatment. ________________________________  Jodelle Gross, M.D., Ph.D.

## 2016-06-27 ENCOUNTER — Ambulatory Visit
Admission: RE | Admit: 2016-06-27 | Discharge: 2016-06-27 | Disposition: A | Discharge status: Home / Self Care | Payer: Medicare Other | Source: Ambulatory Visit | Attending: Radiation Oncology | Admitting: Radiation Oncology

## 2016-06-27 ENCOUNTER — Encounter: Payer: Self-pay | Admitting: Radiation Oncology

## 2016-06-27 VITALS — BP 114/72 | HR 108 | Temp 97.9°F | Resp 18 | Ht 66.0 in | Wt 179.7 lb

## 2016-06-27 DIAGNOSIS — C9000 Multiple myeloma not having achieved remission: Secondary | ICD-10-CM | POA: Insufficient documentation

## 2016-06-27 NOTE — Progress Notes (Signed)
Darlene Howard is here for one month follow up visit for multiple myeloma. After rad tx to right rib, no complaints of swallowing taking  Protonix. Still received chemotherapy Monday 06-25-16.  Appetite is not the best most days.  Having fatigue in the morning.  Denies pain this afternoon. Wt Readings from Last 3 Encounters:  06/27/16 179 lb 11.2 oz (81.511 kg)  06/25/16 176 lb 14.4 oz (80.241 kg)  06/18/16 181 lb 1.6 oz (82.146 kg)  BP 114/72 mmHg  Pulse 108  Temp(Src) 97.9 F (36.6 C) (Oral)  Resp 18  Ht _0  (1.676 m)  Wt 179 lb 11.2 oz (81.511 kg)  BMI 29.02 kg/m2  SpO2 100%

## 2016-06-28 ENCOUNTER — Other Ambulatory Visit: Payer: Self-pay

## 2016-06-28 ENCOUNTER — Other Ambulatory Visit: Payer: Self-pay | Admitting: Medical Oncology

## 2016-06-28 DIAGNOSIS — C9000 Multiple myeloma not having achieved remission: Secondary | ICD-10-CM

## 2016-06-28 LAB — TISSUE HYBRIDIZATION (BONE MARROW)-NCBH

## 2016-06-28 LAB — CHROMOSOME ANALYSIS, BONE MARROW

## 2016-06-28 MED ORDER — VALSARTAN-HYDROCHLOROTHIAZIDE 320-25 MG PO TABS: ORAL_TABLET | ORAL | Status: DC

## 2016-06-28 NOTE — Progress Notes (Signed)
Radiation Oncology         [phone] ________________________________  Name: Darlene Howard MRN: [id number]  Date: 06/27/2016  DOB: [birth year]  Follow-Up Visit Note  CC: [name], MD  Curt Bears, MD  Diagnosis:   Multiple myeloma with metastatic disease to the right sixth rib    ICD-9-CM ICD-10-CM   1. Multiple myeloma not having achieved remission ([place]) 203.00 C90.00     Interval Since Last Radiation:  4  weeks   05/10/2016-05/23/2016: The patient received 30 gray in 10 fractions to a lesion within the right sixth rib  Narrative:  The patient returns today for routine follow-up.  The patient did very well and tolerating her radiotherapy. Since completing treatment, she reports that her pain is nearly resolved. She denies any difficulty with skin breakdown, chest pain or shortness of breath. She is not experiencing any fevers or chills. No other complaints or verbalized.                             ALLERGIES:  is allergic to codeine and tramadol.  Meds: Current Outpatient Prescriptions  Medication Sig Dispense Refill  . acetaminophen (TYLENOL) 500 MG tablet Take 500 mg by mouth daily as needed for moderate pain. Reported on 06/25/2016    . aspirin (ASPIRIN LOW DOSE) 81 MG EC tablet Take 81 mg by mouth daily.      . calcium-vitamin D (OSCAL 500/200 D-3) 500-200 MG-UNIT per tablet Take 1 tablet by mouth 3 (three) times daily.      . cholecalciferol (VITAMIN D) 1000 UNITS tablet Take 1,000 Units by mouth daily. Reported on 04/24/2016    . dexamethasone (DECADRON) 4 MG tablet Take 10 tabs (40 mg) once weekly while undergoing Velcade injections. 40 tablet 2  . dorzolamide-timolol (COSOPT) 22.3-6.8 MG/ML ophthalmic solution Place 1 drop into the left eye 2 (two) times daily.     Marland Kitchen gabapentin (NEURONTIN) 100 MG capsule Take 1 capsule by mouth 3  times daily 270 capsule 1  . hyaluronate sodium (RADIAPLEXRX) GEL Apply 1 application topically 2 (two) times daily. Reported on  06/25/2016    . loperamide (IMODIUM A-D) 2 MG tablet Take 2 mg by mouth daily as needed for diarrhea or loose stools. Reported on 06/25/2016    . loratadine (CLARITIN) 10 MG tablet TAKE ONE TABLET BY MOUTH ONCE DAILY. (Patient taking differently: TAKE 10 MG BY MOUTH ONCE DAILY.) 30 tablet 3  . LUMIGAN 0.01 % SOLN Place 1 drop into both eyes at bedtime.     . Multiple Vitamins-Minerals (CENTRUM SILVER PO) Take 1 tablet by mouth daily.     . ONE TOUCH ULTRA TEST test strip USE AS DIRECTED TO CHECK DAILY. 50 each 3  . pantoprazole (PROTONIX) 40 MG tablet TAKE 1 TABLET BY MOUTH  DAILY FOR ACID REFLUX 90 tablet 1  . potassium chloride SA (K-DUR,KLOR-CON) 20 MEQ tablet Take 1 tablet by mouth  twice a day (Patient taking differently: Take 20 meq by mouth twice daily) 180 tablet 0  . PREMARIN vaginal cream PLACE 1 APPLICATORFUL VAGINALLY TWICE A WEEK. 30 g 0  . temazepam (RESTORIL) 15 MG capsule Take 1 capsule (15 mg total) by mouth at bedtime as needed for sleep. 30 capsule 1  . valACYclovir (VALTREX) 1000 MG tablet Take 1 tablet by mouth  daily as needed 90 tablet 0  . valsartan-hydrochlorothiazide (DIOVAN-HCT) 320-25 MG tablet Take 1 tablet by mouth  daily 90 tablet  0  . magic mouthwash w/lidocaine SOLN Take 5 mLs by mouth 4 (four) times daily as needed for mouth pain. (Patient not taking: Reported on 06/25/2016) 400 mL 0  . ondansetron (ZOFRAN ODT) 4 MG disintegrating tablet Take 1 tablet (4 mg total) by mouth every 8 (eight) hours as needed for nausea or vomiting. (Patient not taking: Reported on 06/25/2016) 20 tablet 0  . ondansetron (ZOFRAN) 4 MG tablet Take 1 tablet (4 mg total) by mouth every 6 (six) hours. (Patient not taking: Reported on 06/25/2016) 10 tablet 0  . oxyCODONE (ROXICODONE) 5 MG immediate release tablet Take 1 tablet (5 mg total) by mouth every 4 (four) hours as needed for severe pain. (Patient not taking: Reported on 06/25/2016) 30 tablet 0  . oxyCODONE-acetaminophen (PERCOCET/ROXICET)  5-325 MG tablet Take 1 tablet by mouth every 8 (eight) hours as needed for severe pain. (Patient not taking: Reported on 06/25/2016) 40 tablet 0  . sucralfate (CARAFATE) 1 g tablet Take 1 tablet (1 g total) by mouth 4 (four) times daily -  with meals and at bedtime. Mix in 2 teaspoons of water to make a slurry and take 5-30 minutes before meals and hs (Patient not taking: Reported on 06/25/2016) 120 tablet 0   No current facility-administered medications for this encounter.    Physical Findings:   height is '5\' 6"'  (1.676 m) and weight is 179 lb 11.2 oz (81.511 kg). Her oral temperature is 97.9 F (36.6 C). Her blood pressure is 114/72 and her pulse is 108. Her respiration is 18 and oxygen saturation is 100%.   In general this is a well appearing African-American female in no acute distress. She's alert and oriented x4 and appropriate throughout the examination. Cardiopulmonary assessment is negative for acute distress and she exhibits normal effort. The skin along the right chest wall and flank does not reveal any hyperpigmentation or desquamation.   Lab Findings: Lab Results  Component Value Date   WBC 9.0 06/25/2016   WBC 7.0 06/15/2016   HGB 9.5* 06/25/2016   HGB 10.4* 06/15/2016   HCT 30.0* 06/25/2016   HCT 31.9* 06/15/2016   PLT 59* 06/25/2016   PLT 38* 06/15/2016    Lab Results  Component Value Date   NA 141 06/25/2016   NA 138 05/19/2016   K 4.1 06/25/2016   K 3.7 05/19/2016   CHLORIDE 105 06/25/2016   CO2 26 06/25/2016   CO2 21 05/19/2016   GLUCOSE 131 06/25/2016   GLUCOSE 126* 05/19/2016   GLUCOSE 126* 06/17/2013   BUN 14.1 06/25/2016   BUN 12 05/19/2016   CREATININE 1.0 06/25/2016   CREATININE 0.77 05/19/2016   CREATININE 0.93 04/24/2016   BILITOT 1.03 06/25/2016   BILITOT 1.3* 04/24/2016   ALKPHOS 76 06/25/2016   ALKPHOS 58 04/24/2016   AST 25 06/25/2016   AST 14* 04/24/2016   ALT 21 06/25/2016   ALT 14 04/24/2016   PROT 7.4 06/25/2016   PROT 7.8 04/24/2016    ALBUMIN 2.7* 06/25/2016   ALBUMIN 3.2* 04/24/2016   CALCIUM 9.5 06/25/2016   CALCIUM 8.8 05/19/2016   ANIONGAP 11 06/25/2016   ANIONGAP 11 04/24/2016    Radiographic Findings: Ct Biopsy  06/15/2016  INDICATION: Multiple myeloma EXAM: CT BIOPSY MEDICATIONS: None. ANESTHESIA/SEDATION: Moderate (conscious) sedation was employed during this procedure. A total of Versed for mg and Fentanyl 50 mcg was administered intravenously. Moderate Sedation Time: 16 minutes. The patient's level of consciousness and vital signs were monitored continuously by radiology nursing throughout the  procedure under my direct supervision. FLUOROSCOPY TIME:  Fluoroscopy Time:  minutes  seconds ( mGy). COMPLICATIONS: None immediate. PROCEDURE: Informed written consent was obtained from the patient after a thorough discussion of the procedural risks, benefits and alternatives. All questions were addressed. Maximal Sterile Barrier Technique was utilized including caps, mask, sterile gowns, sterile gloves, sterile drape, hand hygiene and skin antiseptic. A timeout was performed prior to the initiation of the procedure. 1% lidocaine was utilized for local infiltration. Under CT guidance, an 11 gauge needle was advanced into the right iliac bone via posterior approach. Aspirates were obtained. A core was obtained. FINDINGS: Images document needle placement in the right iliac bone. Post biopsy images demonstrate no evidence of hemorrhage. IMPRESSION: Successful right iliac bone marrow aspirate and core. Electronically Signed   By: Marybelle Killings M.D.   On: 06/15/2016 11:31    Impression/Plan: 1. Multiple Myeloma with metastatic disease to the right 6th rib. The patient has completed radiotherapy and has done well. Her pain has nearly resolved. She will continue her therapy with Dr. [name], and we would be happy to see her back for follow up as needed. She states agreement and understanding.     Carola Rhine, PAC

## 2016-06-29 ENCOUNTER — Other Ambulatory Visit: Payer: Self-pay | Admitting: Gynecologic Oncology

## 2016-07-02 ENCOUNTER — Ambulatory Visit (HOSPITAL_BASED_OUTPATIENT_CLINIC_OR_DEPARTMENT_OTHER): Payer: Medicare Other

## 2016-07-02 ENCOUNTER — Other Ambulatory Visit (HOSPITAL_BASED_OUTPATIENT_CLINIC_OR_DEPARTMENT_OTHER): Payer: Medicare Other

## 2016-07-02 VITALS — BP 116/72 | HR 94 | Temp 98.3°F | Resp 18

## 2016-07-02 DIAGNOSIS — C9002 Multiple myeloma in relapse: Secondary | ICD-10-CM

## 2016-07-02 DIAGNOSIS — Z5112 Encounter for antineoplastic immunotherapy: Secondary | ICD-10-CM | POA: Diagnosis present

## 2016-07-02 DIAGNOSIS — C9000 Multiple myeloma not having achieved remission: Secondary | ICD-10-CM

## 2016-07-02 LAB — COMPREHENSIVE METABOLIC PANEL
ALBUMIN: 2.8 g/dL — AB (ref 3.5–5.0)
ALK PHOS: 76 U/L (ref 40–150)
ALT: 19 U/L (ref 0–55)
ANION GAP: 11 meq/L (ref 3–11)
AST: 22 U/L (ref 5–34)
BUN: 14.4 mg/dL (ref 7.0–26.0)
CALCIUM: 9.2 mg/dL (ref 8.4–10.4)
CO2: 24 mEq/L (ref 22–29)
CREATININE: 1 mg/dL (ref 0.6–1.1)
Chloride: 106 mEq/L (ref 98–109)
EGFR: 64 mL/min/{1.73_m2} — ABNORMAL LOW (ref >90)
Glucose: 125 mg/dl (ref 70–140)
Potassium: 4.2 mEq/L (ref 3.5–5.1)
SODIUM: 141 meq/L (ref 136–145)
TOTAL PROTEIN: 7 g/dL (ref 6.4–8.3)
Total Bilirubin: 0.58 mg/dL (ref 0.20–1.20)

## 2016-07-02 LAB — CBC WITH DIFFERENTIAL/PLATELET
BASO%: 0.2 % (ref 0.0–2.0)
BASOS ABS: 0 10*3/uL (ref 0.0–0.1)
EOS%: 0.7 % (ref 0.0–7.0)
Eosinophils Absolute: 0.1 10*3/uL (ref 0.0–0.5)
HCT: 29.7 % — ABNORMAL LOW (ref 34.8–46.6)
HGB: 9.3 g/dL — ABNORMAL LOW (ref 11.6–15.9)
LYMPH%: 9.9 % — ABNORMAL LOW (ref 14.0–49.7)
MCH: 29.5 pg (ref 25.1–34.0)
MCHC: 31.3 g/dL — AB (ref 31.5–36.0)
MCV: 94.3 fL (ref 79.5–101.0)
MONO#: 1.2 10*3/uL — ABNORMAL HIGH (ref 0.1–0.9)
MONO%: 13.7 % (ref 0.0–14.0)
NEUT#: 6.8 10*3/uL — ABNORMAL HIGH (ref 1.5–6.5)
NEUT%: 75.5 % (ref 38.4–76.8)
NRBC: 3 % — AB (ref 0–0)
Platelets: 64 10*3/uL — ABNORMAL LOW (ref 145–400)
RBC: 3.15 10*6/uL — AB (ref 3.70–5.45)
RDW: 22.8 % — AB (ref 11.2–14.5)
WBC: 9 10*3/uL (ref 3.9–10.3)
lymph#: 0.9 10*3/uL (ref 0.9–3.3)

## 2016-07-02 LAB — TECHNOLOGIST REVIEW

## 2016-07-02 MED ORDER — BORTEZOMIB CHEMO SQ INJECTION 3.5 MG (2.5MG/ML)
1.3000 mg/m2 | Freq: Once | INTRAMUSCULAR | Status: AC
  Administered 2016-07-02: 2.5 mg via SUBCUTANEOUS
  Filled 2016-07-02: qty 2.5

## 2016-07-02 MED ORDER — PROCHLORPERAZINE MALEATE 10 MG PO TABS
ORAL_TABLET | ORAL | Status: AC
  Filled 2016-07-02: qty 1

## 2016-07-02 MED ORDER — PROCHLORPERAZINE MALEATE 10 MG PO TABS
10.0000 mg | ORAL_TABLET | Freq: Once | ORAL | Status: AC
  Administered 2016-07-02: 10 mg via ORAL

## 2016-07-02 NOTE — Patient Instructions (Signed)
[facility] Discharge Instructions for Patients Receiving Chemotherapy  Today you received the following chemotherapy agents Velcade. To help prevent nausea and vomiting after your treatment, we encourage you to take your nausea medication as directed.  If you develop nausea and vomiting that is not controlled by your nausea medication, call the clinic.   BELOW ARE SYMPTOMS THAT SHOULD BE REPORTED IMMEDIATELY:  *FEVER GREATER THAN 100.5 F  *CHILLS WITH OR WITHOUT FEVER  NAUSEA AND VOMITING THAT IS NOT CONTROLLED WITH YOUR NAUSEA MEDICATION  *UNUSUAL SHORTNESS OF BREATH  *UNUSUAL BRUISING OR BLEEDING  TENDERNESS IN MOUTH AND THROAT WITH OR WITHOUT PRESENCE OF ULCERS  *URINARY PROBLEMS  *BOWEL PROBLEMS  UNUSUAL RASH Items with * indicate a potential emergency and should be followed up as soon as possible.  Feel free to call the clinic you have any questions or concerns. The clinic phone number is [phone].  Please show the CHEMO ALERT CARD at check-in to the Emergency Department and triage nurse.    

## 2016-07-02 NOTE — Progress Notes (Signed)
Okay to treat today despite platelet count 64K per Dr. [name].    Pt reporting continued diarrhea.  Pt states MD aware and previously told her to take immodium but pt questioning if there is anything else she could do.  Pt reports max of 4 loose BMs per day.  Upon further investigation, pt states she takes 2 immodium after first loose stool and then 1 after the next loose stool.  I discussed with pt that she should try to take additional immodium doses to better control diarrhea.  Reviewed with pt to take 2 immodium after first loose stool followed by 1 immodium following each subsequent loose stool for a max of 8 doses in a 24 hour period.  Pt informed to call us if her diarrhea does not improve and resolve with 8 immodium per day.  Pt verbalized understanding and will contact us with further questions should they arise.

## 2016-07-05 ENCOUNTER — Telehealth: Payer: Self-pay | Admitting: Gynecologic Oncology

## 2016-07-05 ENCOUNTER — Encounter (HOSPITAL_COMMUNITY): Payer: Self-pay

## 2016-07-05 NOTE — Telephone Encounter (Signed)
Informed patient that she could use 1 g of premarin cream twice weekly per Dr. Skeet Latch instead of 2G.  Verbalizing understanding

## 2016-07-05 NOTE — Telephone Encounter (Signed)
Patient states she is using 2 grams (applicator full) or premarin twice a week.  Odell notified for her prescription to be adjusted.

## 2016-07-09 ENCOUNTER — Telehealth: Payer: Self-pay | Admitting: Internal Medicine

## 2016-07-09 ENCOUNTER — Other Ambulatory Visit: Payer: Medicare Other

## 2016-07-09 ENCOUNTER — Ambulatory Visit (HOSPITAL_BASED_OUTPATIENT_CLINIC_OR_DEPARTMENT_OTHER): Payer: Medicare Other | Admitting: Internal Medicine

## 2016-07-09 ENCOUNTER — Ambulatory Visit (HOSPITAL_BASED_OUTPATIENT_CLINIC_OR_DEPARTMENT_OTHER): Payer: Medicare Other

## 2016-07-09 ENCOUNTER — Encounter: Payer: Self-pay | Admitting: Internal Medicine

## 2016-07-09 ENCOUNTER — Ambulatory Visit: Payer: Medicare Other

## 2016-07-09 ENCOUNTER — Other Ambulatory Visit (HOSPITAL_BASED_OUTPATIENT_CLINIC_OR_DEPARTMENT_OTHER): Payer: Medicare Other

## 2016-07-09 VITALS — BP 147/69 | HR 97 | Temp 97.7°F | Resp 18 | Ht 66.0 in | Wt 183.9 lb

## 2016-07-09 DIAGNOSIS — R5383 Other fatigue: Secondary | ICD-10-CM

## 2016-07-09 DIAGNOSIS — C9000 Multiple myeloma not having achieved remission: Secondary | ICD-10-CM | POA: Diagnosis not present

## 2016-07-09 DIAGNOSIS — C9002 Multiple myeloma in relapse: Secondary | ICD-10-CM | POA: Diagnosis present

## 2016-07-09 DIAGNOSIS — Z5112 Encounter for antineoplastic immunotherapy: Secondary | ICD-10-CM | POA: Diagnosis present

## 2016-07-09 LAB — CBC WITH DIFFERENTIAL/PLATELET
BASO%: 0.2 % (ref 0.0–2.0)
Basophils Absolute: 0 10*3/uL (ref 0.0–0.1)
EOS ABS: 0.1 10*3/uL (ref 0.0–0.5)
EOS%: 0.4 % (ref 0.0–7.0)
HCT: 27.9 % — ABNORMAL LOW (ref 34.8–46.6)
HGB: 8.9 g/dL — ABNORMAL LOW (ref 11.6–15.9)
LYMPH%: 8.4 % — AB (ref 14.0–49.7)
MCH: 30.2 pg (ref 25.1–34.0)
MCHC: 31.9 g/dL (ref 31.5–36.0)
MCV: 94.6 fL (ref 79.5–101.0)
MONO#: 1.6 10*3/uL — ABNORMAL HIGH (ref 0.1–0.9)
MONO%: 13.2 % (ref 0.0–14.0)
NEUT%: 77.8 % — ABNORMAL HIGH (ref 38.4–76.8)
NEUTROS ABS: 9.5 10*3/uL — AB (ref 1.5–6.5)
PLATELETS: 59 10*3/uL — AB (ref 145–400)
RBC: 2.95 10*6/uL — AB (ref 3.70–5.45)
RDW: 24.3 % — ABNORMAL HIGH (ref 11.2–14.5)
WBC: 12.2 10*3/uL — AB (ref 3.9–10.3)
lymph#: 1 10*3/uL (ref 0.9–3.3)
nRBC: 2 % — ABNORMAL HIGH (ref 0–0)

## 2016-07-09 LAB — COMPREHENSIVE METABOLIC PANEL
ALT: 58 U/L — AB (ref 0–55)
ANION GAP: 7 meq/L (ref 3–11)
AST: 74 U/L — ABNORMAL HIGH (ref 5–34)
Albumin: 2.8 g/dL — ABNORMAL LOW (ref 3.5–5.0)
Alkaline Phosphatase: 78 U/L (ref 40–150)
BUN: 13.1 mg/dL (ref 7.0–26.0)
CHLORIDE: 110 meq/L — AB (ref 98–109)
CO2: 27 meq/L (ref 22–29)
Calcium: 9 mg/dL (ref 8.4–10.4)
Creatinine: 0.8 mg/dL (ref 0.6–1.1)
EGFR: 85 mL/min/{1.73_m2} — AB (ref >90)
Glucose: 110 mg/dl (ref 70–140)
Potassium: 4.5 mEq/L (ref 3.5–5.1)
SODIUM: 144 meq/L (ref 136–145)
Total Bilirubin: 1.1 mg/dL (ref 0.20–1.20)
Total Protein: 6.7 g/dL (ref 6.4–8.3)

## 2016-07-09 LAB — TECHNOLOGIST REVIEW

## 2016-07-09 MED ORDER — PROCHLORPERAZINE MALEATE 10 MG PO TABS
ORAL_TABLET | ORAL | Status: AC
  Filled 2016-07-09: qty 1

## 2016-07-09 MED ORDER — BORTEZOMIB CHEMO SQ INJECTION 3.5 MG (2.5MG/ML)
1.3000 mg/m2 | Freq: Once | INTRAMUSCULAR | Status: AC
  Administered 2016-07-09: 2.5 mg via SUBCUTANEOUS
  Filled 2016-07-09: qty 2.5

## 2016-07-09 MED ORDER — PROCHLORPERAZINE MALEATE 10 MG PO TABS
10.0000 mg | ORAL_TABLET | Freq: Once | ORAL | Status: AC
  Administered 2016-07-09: 10 mg via ORAL

## 2016-07-09 MED ORDER — SODIUM CHLORIDE 0.9 % IV SOLN: Freq: Once | INTRAVENOUS | Status: DC

## 2016-07-09 MED ORDER — ZOLEDRONIC ACID 4 MG/5ML IV CONC
4.0000 mg | Freq: Once | INTRAVENOUS | Status: DC
Start: 2016-07-09 — End: 2016-07-09

## 2016-07-09 NOTE — Progress Notes (Signed)
Dr. [name] aware of platelet count. OK to treat.

## 2016-07-09 NOTE — Progress Notes (Signed)
[facility] [phone]   [phone]  OFFICE PROGRESS NOTE  Darlene Nakayama, MD [address], [address] [address]  DIAGNOSIS: Stage IIA IgA kappa multiple myeloma diagnosed in 2006   PRIOR THERAPY:  1) status post treatment with thalidomide and Decadron followed by autologous peripheral blood stem cell transplant with high-dose melphalan on 04/26/2006 at [facility].  2) the patient had evidence for disease recurrence in August of 2008 and she was treated with 6 cycles of Velcade completed on 03/30/2008 with response to the treatment but was discontinued secondary to neuropathy.  3) maintenance treatment with Revlimid 10 mg by mouth daily by the does was later increase to 15 mg by mouth daily secondary to biochemical recurrence with stabilization of her disease. The treatment with Revlimid 15 mg by mouth daily was discontinued secondary to evidence for disease progression. 4) Revlimid 25 mg by mouth daily for 3 weeks every 4 weeks in addition to Decadron currently at 20 mg by mouth on a weekly basis. She is status post 12 cycles. This was discontinued secondary to disease progression. 5) systemic chemotherapy with Daratumumab 16 MG/KG weekly for the first 9 weeks, Velcade 1.3 MG/M2 subcutaneously on days 1, 4, 8 and 11 every 3 weeks in addition to Decadron 20 mg by mouth on days 1, 2, 4, 5, 8, 9, 11 and 12 every 3 weeks. First dose 05/07/2016. Status post one cycle discontinued secondary to intolerance.   CURRENT THERAPY:  1) systemic chemotherapy with Velcade 1.3 MG/M2 subcutaneously weekly in addition to Decadron 40 mg by mouth daily. 2) Zometa 4 mg IV every 3 months. First dose in 05/05/2015  ADVANCED DIRECTIVE: She does not have advanced directive and was given information.  INTERVAL HISTORY: Darlene Howard 65 y.o. female returns to the clinic today for routine monthly followup visit accompanied by several family members. The  patient is doing fine today with no specific complaints. She is tolerating her treatment with weekly subcutaneous Velcade and Decadron fairly well. She status post 2 cycles. The patient denied having any fever or chills. She denied having any shortness of breath, cough or hemoptysis. She has no weight loss or night sweats. She is here today for evaluation before starting cycle #3.  MEDICAL HISTORY: Past Medical History  Diagnosis Date  . Glaucoma     uses eye drops as instructed  . Personal history of colon cancer     ASCENDING COLON--  S/P  RIGHT HEMICOLECTOMY WITH NEGATIVE NODES/    NO RECURRENCE  . Herpes     takes Valtrex as needed  . GERD (gastroesophageal reflux disease)     takes Pantoprazole daily  . Hypertension     takes Amlodipine and Diovan daily  . Bruises easily     d/t being on COumadin  . History of colon polyps   . Type 2 diabetes mellitus ([place])   . 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 H/O stem cell transplant ([place])     04-26-2006  AT [facility]  . Multiple myeloma DX  2006  STATE IIA,  IgA  [facility]    STABLE  PER DR Oswin Johal--  S/P STEM CELL TRANSPLANT 2007 /  RECURRENCE 2008  CHEMO ENDED 03-30-2008/   NOW ON MAINTANANCE REVLIMID )  . Vocal cord nodule     resolved- see Dr. Deeann Saint notes in media tab 06/17/14  . DDD (  degenerative disc disease), lumbar   . Cholelithiasis   . Fatty liver   . Lung cancer ([place])     chest wall lesion aadn 6th rib    ALLERGIES:  is allergic to codeine and tramadol.  MEDICATIONS:  Current Outpatient Prescriptions  Medication Sig Dispense Refill  . acetaminophen (TYLENOL) 500 MG tablet Take 500 mg by mouth daily as needed for moderate pain. Reported on 06/25/2016    . aspirin (ASPIRIN LOW DOSE) 81 MG EC tablet Take 81 mg by mouth daily.      . calcium-vitamin D (OSCAL 500/200 D-3) 500-200 MG-UNIT per tablet Take 1 tablet by mouth 3 (three) times daily.      . cholecalciferol  (VITAMIN D) 1000 UNITS tablet Take 1,000 Units by mouth daily. Reported on 04/24/2016    . dexamethasone (DECADRON) 4 MG tablet Take 10 tabs (40 mg) once weekly while undergoing Velcade injections. 40 tablet 2  . dorzolamide-timolol (COSOPT) 22.3-6.8 MG/ML ophthalmic solution Place 1 drop into the left eye 2 (two) times daily.     Marland Kitchen gabapentin (NEURONTIN) 100 MG capsule Take 1 capsule by mouth 3  times daily 270 capsule 1  . hyaluronate sodium (RADIAPLEXRX) GEL Apply 1 application topically 2 (two) times daily. Reported on 06/25/2016    . loratadine (CLARITIN) 10 MG tablet TAKE ONE TABLET BY MOUTH ONCE DAILY. (Patient taking differently: TAKE 10 MG BY MOUTH ONCE DAILY.) 30 tablet 3  . LUMIGAN 0.01 % SOLN Place 1 drop into both eyes at bedtime.     . Multiple Vitamins-Minerals (CENTRUM SILVER PO) Take 1 tablet by mouth daily.     . pantoprazole (PROTONIX) 40 MG tablet TAKE 1 TABLET BY MOUTH  DAILY FOR ACID REFLUX 90 tablet 1  . potassium chloride SA (K-DUR,KLOR-CON) 20 MEQ tablet Take 1 tablet by mouth  twice a day (Patient taking differently: Take 20 meq by mouth twice daily) 180 tablet 0  . PREMARIN vaginal cream PLACE 1 APPLICATORFUL VAGINALLY TWICE A WEEK. 30 g 0  . temazepam (RESTORIL) 15 MG capsule Take 1 capsule (15 mg total) by mouth at bedtime as needed for sleep. 30 capsule 1  . valACYclovir (VALTREX) 1000 MG tablet Take 1 tablet by mouth  daily as needed 90 tablet 0  . valsartan-hydrochlorothiazide (DIOVAN-HCT) 320-25 MG tablet Take 1 tablet by mouth  daily 90 tablet 1  . loperamide (IMODIUM A-D) 2 MG tablet Take 2 mg by mouth daily as needed for diarrhea or loose stools. Reported on 07/09/2016    . ondansetron (ZOFRAN ODT) 4 MG disintegrating tablet Take 1 tablet (4 mg total) by mouth every 8 (eight) hours as needed for nausea or vomiting. (Patient not taking: Reported on 06/25/2016) 20 tablet 0  . ondansetron (ZOFRAN) 4 MG tablet Take 1 tablet (4 mg total) by mouth every 6 (six) hours.  (Patient not taking: Reported on 06/25/2016) 10 tablet 0  . ONE TOUCH ULTRA TEST test strip USE AS DIRECTED TO CHECK DAILY. (Patient not taking: Reported on 07/09/2016) 50 each 3  . oxyCODONE (ROXICODONE) 5 MG immediate release tablet Take 1 tablet (5 mg total) by mouth every 4 (four) hours as needed for severe pain. (Patient not taking: Reported on 06/25/2016) 30 tablet 0  . oxyCODONE-acetaminophen (PERCOCET/ROXICET) 5-325 MG tablet Take 1 tablet by mouth every 8 (eight) hours as needed for severe pain. (Patient not taking: Reported on 06/25/2016) 40 tablet 0   No current facility-administered medications for this visit.    SURGICAL HISTORY:  Past  Surgical History  Procedure Laterality Date  . Colonoscopy  10/07/2006    [id number] rectum/Status post right hemicolectomy. Normal residual colon  . Cervical cone biopsy    . Cataract extraction w/phaco Left 01/20/2014    Procedure: CATARACT EXTRACTION PHACO AND INTRAOCULAR LENS PLACEMENT (IOC);  Surgeon: Adonis Brook, MD;  Location: [facility];  Service: Ophthalmology;  Laterality: Left;  . 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 Co2  ablation vulva perianal and vaginal dysplatic areas  02-15-2010  . Abdominal hysterectomy  1983    w/  appendectomy and left salpingoophorectom  . Hemicolectomy Right 04-27-2005    CARCINOMA   ASCENDING COLON  . Vulvectomy N/A 05/06/2014    Procedure: WIDE LOCAL EXCISION LEFT LABIA MAJORA;  Surgeon: Janie Morning, MD;  Location: [facility];  Service: Gynecology;  Laterality: N/A;  . Vulvar lesion removal N/A 05/06/2014    Procedure: LASER OF VULVAR LESION;  Surgeon: Janie Morning, MD;  Location: [facility];  Service: Gynecology;  Laterality: N/A;  . Lesion removal N/A 05/06/2014    Procedure: LASER OF THE VAGINA;  Surgeon: Janie Morning, MD;  Location: [facility];  Service: Gynecology;  Laterality: N/A;  . Esophagogastroduodenoscopy  04/02/2005     [id number] polyp with central erosion versus ulceration in the body of the stomach, resected and recovered Remainder of the gastric mucosa, D1, D2 appeared normal  . Colonoscopy  10/2009    RMR: diminutive RS polyp (hyperplastic).   . Colonoscopy N/A 12/15/2014    Procedure: COLONOSCOPY;  Surgeon: Daneil Dolin, MD;  [facility];  Service: Endoscopy;  Laterality: N/A;  130   . Cholecystectomy N/A 02/28/2016    Procedure: LAPAROSCOPIC CHOLECYSTECTOMY;  Surgeon: Ralene Ok, MD;  Location: [facility];  Service: General;  Laterality: N/A;    REVIEW OF SYSTEMS:  A comprehensive review of systems was negative except for: Constitutional: positive for fatigue   PHYSICAL EXAMINATION: General appearance: alert, cooperative and no distress Head: Normocephalic, without obvious abnormality, atraumatic Neck: no adenopathy Lymph nodes: Cervical, supraclavicular, and axillary nodes normal. Resp: clear to auscultation bilaterally Back: symmetric, no curvature. ROM normal. No CVA tenderness. Cardio: regular rate and rhythm, S1, S2 normal, no murmur, click, rub or gallop GI: soft, non-tender; bowel sounds normal; no masses,  no organomegaly Extremities: extremities normal, atraumatic, no cyanosis or edema Neurologic: Alert and oriented X 3, normal strength and tone. Normal symmetric reflexes. Normal coordination and gait  ECOG PERFORMANCE STATUS: 1 - Symptomatic but completely ambulatory  Blood pressure 147/69, pulse 97, temperature 97.7 F (36.5 C), temperature source Oral, resp. rate 18, height _0  (1.676 m), weight 183 lb 14.4 oz (83.416 kg), SpO2 99 %.  LABORATORY DATA: Lab Results  Component Value Date   WBC 12.2* 07/09/2016   HGB 8.9* 07/09/2016   HCT 27.9* 07/09/2016   MCV 94.6 07/09/2016   PLT 59* 07/09/2016      Chemistry      Component Value Date/Time   NA 144 07/09/2016 1128   NA 138 05/19/2016 0823   K 4.5 07/09/2016 1128   K 3.7 05/19/2016 0823   CL  106 05/19/2016 0823   CL 105 06/17/2013 1441   CO2 27 07/09/2016 1128   CO2 21 05/19/2016 0823   BUN 13.1 07/09/2016 1128   BUN 12 05/19/2016 0823   CREATININE 0.8 07/09/2016 1128   CREATININE 0.77 05/19/2016 0823   CREATININE 0.93 04/24/2016 2003  Component Value Date/Time   CALCIUM 9.0 07/09/2016 1128   CALCIUM 8.8 05/19/2016 0823   ALKPHOS 78 07/09/2016 1128   ALKPHOS 58 04/24/2016 2003   AST 74* 07/09/2016 1128   AST 14* 04/24/2016 2003   ALT 58* 07/09/2016 1128   ALT 14 04/24/2016 2003   BILITOT 1.10 07/09/2016 1128   BILITOT 1.3* 04/24/2016 2003       RADIOGRAPHIC STUDIES: Ct Biopsy  06/15/2016  INDICATION: Multiple myeloma EXAM: CT BIOPSY MEDICATIONS: None. ANESTHESIA/SEDATION: Moderate (conscious) sedation was employed during this procedure. A total of Versed for mg and Fentanyl 50 mcg was administered intravenously. Moderate Sedation Time: 16 minutes. The patient's level of consciousness and vital signs were monitored continuously by radiology nursing throughout the procedure under my direct supervision. FLUOROSCOPY TIME:  Fluoroscopy Time:  minutes  seconds ( mGy). COMPLICATIONS: None immediate. PROCEDURE: Informed written consent was obtained from the patient after a thorough discussion of the procedural risks, benefits and alternatives. All questions were addressed. Maximal Sterile Barrier Technique was utilized including caps, mask, sterile gowns, sterile gloves, sterile drape, hand hygiene and skin antiseptic. A timeout was performed prior to the initiation of the procedure. 1% lidocaine was utilized for local infiltration. Under CT guidance, an 11 gauge needle was advanced into the right iliac bone via posterior approach. Aspirates were obtained. A core was obtained. FINDINGS: Images document needle placement in the right iliac bone. Post biopsy images demonstrate no evidence of hemorrhage. IMPRESSION: Successful right iliac bone marrow aspirate and core. Electronically  Signed   By: Marybelle Killings M.D.   On: 06/15/2016 11:31    ASSESSMENT AND PLAN: This is a very pleasant 65 years old [place] American female with history of multiple myeloma currently on treatment with Revlimid and low-dose Decadron and tolerated her treatment fairly well. The patient is doing fine today with no specific complaints except for mild fatigue.  Because of disease progression, I recommended for her to continue her treatment with Revlimid and Decadron but I increased the dose of Revlimid to 25 mg by mouth daily for 21 days every 4 weeks, status post 12 months of this regimen. The recent myeloma panel performed at [facility] showed evidence for disease progression. The patient also has enlarging right-sided chest wall lesion. She underwent palliative radiotherapy to the enlarging right-posterior seventh rib lesion. She was treated with 1 cycle of chemotherapy with Daratumumab, Velcade and Decadron but this was discontinued secondary to intolerance and pancytopenia. She is currently undergoing treatment with weekly subcutaneous Velcade in addition to weekly Decadron. Status post 2 cycles. I recommended for the patient to proceed with cycle #3 today as a scheduled. I recommended for the patient to come back for follow-up visit in 3 weeks for reevaluation and management of any adverse effect of her treatment.  Because mild elevation of her liver enzyme and I will continue to monitor this closely on upcoming lab work. The patient will continue her treatment with Zometa every 3 months as a scheduled. She was advised to call immediately if she has any concerning symptoms and interval. The patient voices understanding of current disease status and treatment options and is in agreement with the current care plan.  All questions were answered. The patient knows to call the clinic with any problems, questions or concerns. We can certainly see the patient much sooner if necessary.  Disclaimer:  This note was dictated with voice recognition software. Similar sounding words can inadvertently be transcribed and may not be corrected upon review.

## 2016-07-09 NOTE — Patient Instructions (Signed)
[facility] Discharge Instructions for Patients Receiving Chemotherapy  Today you received the following chemotherapy agents Velcade. To help prevent nausea and vomiting after your treatment, we encourage you to take your nausea medication as directed.  If you develop nausea and vomiting that is not controlled by your nausea medication, call the clinic.   BELOW ARE SYMPTOMS THAT SHOULD BE REPORTED IMMEDIATELY:  *FEVER GREATER THAN 100.5 F  *CHILLS WITH OR WITHOUT FEVER  NAUSEA AND VOMITING THAT IS NOT CONTROLLED WITH YOUR NAUSEA MEDICATION  *UNUSUAL SHORTNESS OF BREATH  *UNUSUAL BRUISING OR BLEEDING  TENDERNESS IN MOUTH AND THROAT WITH OR WITHOUT PRESENCE OF ULCERS  *URINARY PROBLEMS  *BOWEL PROBLEMS  UNUSUAL RASH Items with * indicate a potential emergency and should be followed up as soon as possible.  Feel free to call the clinic you have any questions or concerns. The clinic phone number is [phone].  Please show the CHEMO ALERT CARD at check-in to the Emergency Department and triage nurse.    

## 2016-07-09 NOTE — Telephone Encounter (Signed)
Gave patient avs report and appointments  For July and August.

## 2016-07-10 ENCOUNTER — Other Ambulatory Visit: Payer: Self-pay | Admitting: Family Medicine

## 2016-07-10 DIAGNOSIS — Z1231 Encounter for screening mammogram for malignant neoplasm of breast: Secondary | ICD-10-CM

## 2016-07-16 ENCOUNTER — Ambulatory Visit (HOSPITAL_COMMUNITY)
Admission: RE | Admit: 2016-07-16 | Discharge: 2016-07-16 | Disposition: A | Discharge status: Home / Self Care | Payer: Medicare Other | Source: Ambulatory Visit | Attending: Nurse Practitioner | Admitting: Nurse Practitioner

## 2016-07-16 ENCOUNTER — Encounter: Payer: Self-pay | Admitting: Nurse Practitioner

## 2016-07-16 ENCOUNTER — Ambulatory Visit (HOSPITAL_BASED_OUTPATIENT_CLINIC_OR_DEPARTMENT_OTHER): Payer: Medicare Other

## 2016-07-16 ENCOUNTER — Telehealth: Payer: Self-pay | Admitting: *Deleted

## 2016-07-16 ENCOUNTER — Other Ambulatory Visit: Payer: Self-pay | Admitting: Nurse Practitioner

## 2016-07-16 ENCOUNTER — Other Ambulatory Visit (HOSPITAL_BASED_OUTPATIENT_CLINIC_OR_DEPARTMENT_OTHER): Payer: Medicare Other

## 2016-07-16 ENCOUNTER — Ambulatory Visit (HOSPITAL_BASED_OUTPATIENT_CLINIC_OR_DEPARTMENT_OTHER): Payer: Medicare Other | Admitting: Nurse Practitioner

## 2016-07-16 VITALS — BP 139/68 | HR 82 | Temp 98.1°F | Resp 18

## 2016-07-16 DIAGNOSIS — C9000 Multiple myeloma not having achieved remission: Secondary | ICD-10-CM

## 2016-07-16 DIAGNOSIS — R600 Localized edema: Secondary | ICD-10-CM | POA: Diagnosis not present

## 2016-07-16 DIAGNOSIS — Z5112 Encounter for antineoplastic immunotherapy: Secondary | ICD-10-CM | POA: Diagnosis present

## 2016-07-16 LAB — CBC WITH DIFFERENTIAL/PLATELET
BASO%: 0.2 % (ref 0.0–2.0)
Basophils Absolute: 0 10*3/uL (ref 0.0–0.1)
EOS%: 0.9 % (ref 0.0–7.0)
Eosinophils Absolute: 0.1 10*3/uL (ref 0.0–0.5)
HEMATOCRIT: 26.4 % — AB (ref 34.8–46.6)
HEMOGLOBIN: 8.4 g/dL — AB (ref 11.6–15.9)
LYMPH#: 1.3 10*3/uL (ref 0.9–3.3)
LYMPH%: 11 % — ABNORMAL LOW (ref 14.0–49.7)
MCH: 30.5 pg (ref 25.1–34.0)
MCHC: 31.8 g/dL (ref 31.5–36.0)
MCV: 96 fL (ref 79.5–101.0)
MONO#: 0.9 10*3/uL (ref 0.1–0.9)
MONO%: 7.4 % (ref 0.0–14.0)
NEUT%: 80.5 % — ABNORMAL HIGH (ref 38.4–76.8)
NEUTROS ABS: 9.4 10*3/uL — AB (ref 1.5–6.5)
NRBC: 1 % — AB (ref 0–0)
Platelets: 52 10*3/uL — ABNORMAL LOW (ref 145–400)
RBC: 2.75 10*6/uL — ABNORMAL LOW (ref 3.70–5.45)
RDW: 24.6 % — AB (ref 11.2–14.5)
WBC: 11.7 10*3/uL — AB (ref 3.9–10.3)

## 2016-07-16 LAB — COMPREHENSIVE METABOLIC PANEL
ALBUMIN: 2.8 g/dL — AB (ref 3.5–5.0)
ALK PHOS: 77 U/L (ref 40–150)
ALT: 31 U/L (ref 0–55)
AST: 24 U/L (ref 5–34)
Anion Gap: 10 mEq/L (ref 3–11)
BUN: 8.3 mg/dL (ref 7.0–26.0)
CO2: 28 mEq/L (ref 22–29)
CREATININE: 0.8 mg/dL (ref 0.6–1.1)
Calcium: 9 mg/dL (ref 8.4–10.4)
Chloride: 106 mEq/L (ref 98–109)
EGFR: 87 mL/min/{1.73_m2} — ABNORMAL LOW (ref >90)
GLUCOSE: 108 mg/dL (ref 70–140)
POTASSIUM: 3.7 meq/L (ref 3.5–5.1)
SODIUM: 144 meq/L (ref 136–145)
TOTAL PROTEIN: 6.4 g/dL (ref 6.4–8.3)
Total Bilirubin: 0.83 mg/dL (ref 0.20–1.20)

## 2016-07-16 LAB — TECHNOLOGIST REVIEW: Technologist Review: 1

## 2016-07-16 MED ORDER — PROCHLORPERAZINE MALEATE 10 MG PO TABS
10.0000 mg | ORAL_TABLET | Freq: Once | ORAL | Status: AC
  Administered 2016-07-16: 10 mg via ORAL

## 2016-07-16 MED ORDER — BORTEZOMIB CHEMO SQ INJECTION 3.5 MG (2.5MG/ML)
1.3000 mg/m2 | Freq: Once | INTRAMUSCULAR | Status: AC
  Administered 2016-07-16: 2.5 mg via SUBCUTANEOUS
  Filled 2016-07-16: qty 2.5

## 2016-07-16 MED ORDER — PROCHLORPERAZINE MALEATE 10 MG PO TABS
ORAL_TABLET | ORAL | Status: AC
  Filled 2016-07-16: qty 1

## 2016-07-16 NOTE — Progress Notes (Signed)
*  PRELIMINARY RESULTS* Vascular Ultrasound Left lower extremity venous duplex has been completed.  Preliminary findings: No evidence of DVT or baker's cyst.   Called results to [employer].   Landry Mellow, RDMS, RVT  07/16/2016, 1:11 PM

## 2016-07-16 NOTE — Telephone Encounter (Signed)
RN from Infusion room where with concerns, pt c/o pain in left leg/calf, tenderness and warm to touch. After review with MD, request for pt to be evaluated by [facility]. POF to scheduling. Message routed to Selena Lesser, NP.

## 2016-07-16 NOTE — Patient Instructions (Signed)
[facility] Discharge Instructions for Patients Receiving Chemotherapy  Today you received the following chemotherapy agents Velcade. To help prevent nausea and vomiting after your treatment, we encourage you to take your nausea medication as directed.  If you develop nausea and vomiting that is not controlled by your nausea medication, call the clinic.   BELOW ARE SYMPTOMS THAT SHOULD BE REPORTED IMMEDIATELY:  *FEVER GREATER THAN 100.5 F  *CHILLS WITH OR WITHOUT FEVER  NAUSEA AND VOMITING THAT IS NOT CONTROLLED WITH YOUR NAUSEA MEDICATION  *UNUSUAL SHORTNESS OF BREATH  *UNUSUAL BRUISING OR BLEEDING  TENDERNESS IN MOUTH AND THROAT WITH OR WITHOUT PRESENCE OF ULCERS  *URINARY PROBLEMS  *BOWEL PROBLEMS  UNUSUAL RASH Items with * indicate a potential emergency and should be followed up as soon as possible.  Feel free to call the clinic you have any questions or concerns. The clinic phone number is [phone].  Please show the CHEMO ALERT CARD at check-in to the Emergency Department and triage nurse.    

## 2016-07-16 NOTE — Assessment & Plan Note (Signed)
Patient states that she has noticed some increased left leg edema for the past few days.  She denies any known injury or trauma to her leg.  She denies any calf pain.  She denies any chest pain, chest pressure, shortness of breath, or pain with inspiration.  Exam today reveals a left leg edema; but no erythema, warmth, tenderness, or red streaks.  Arrange for patient to undergo a Doppler ultrasound at 1 PM today.  Since patient has finished her Velcade injection.  This morning-she states that she will leave the hospital return at 1 PM.  Patient was encouraged to go directly to the emergency department for any worsening symptoms in the meantime.

## 2016-07-16 NOTE — Assessment & Plan Note (Signed)
Patient presented to the [facility] today to receive cycle 4 of her weekly Velcade injections.  She states that she's been doing fairly well recently; with her only complaint some left leg edema for the past few days.  See further notes for details.  Patient also continues to receive Zometa infusions on an every three-month basis.  Her last Zometa infusion was 05/07/2016.  Her next Zometa infusion should be scheduled for sometime around 08/07/2016.  Blood counts obtained today reveal a WBC of 9.0, ANC 6.8, hemoglobin 9.3, and platelet count 64.  Patient is scheduled to return for labs and her next Velcade injection on 07/23/2016.  She is scheduled for labs, visit, and a Velcade injection again on 07/30/2016.

## 2016-07-16 NOTE — Progress Notes (Signed)
SYMPTOM MANAGEMENT CLINIC    Chief Complaint: Leg edema  HPI:  Darlene Howard 65 y.o. female diagnosed with multiple myeloma. Currently undergoing weekly Velcade injections and Zometa on an every three-month basis.   Patient states that she has noticed some increased left leg edema for the past few days.  She denies any known injury or trauma to her leg.  She denies any calf pain.  She denies any chest pain, chest pressure, shortness of breath, or pain with inspiration.  Exam today reveals a left leg edema; but no erythema, warmth, tenderness, or red streaks.  Arrange for patient to undergo a Doppler ultrasound at 1 PM today.  Since patient has finished her Velcade injection.  This morning-she states that she will leave the hospital return at 1 PM.  Patient was encouraged to go directly to the emergency department for any worsening symptoms in the meantime.   No history exists.    Review of Systems  Cardiovascular: Positive for leg swelling.  All other systems reviewed and are negative.   Past Medical History  Diagnosis Date  . Glaucoma     uses eye drops as instructed  . Personal history of colon cancer     ASCENDING COLON--  S/P  RIGHT HEMICOLECTOMY WITH NEGATIVE NODES/    NO RECURRENCE  . Herpes     takes Valtrex as needed  . GERD (gastroesophageal reflux disease)     takes Pantoprazole daily  . Hypertension     takes Amlodipine and Diovan daily  . Bruises easily     d/t being on COumadin  . History of colon polyps   . Type 2 diabetes mellitus ([place])   . 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 Multiple myeloma DX  2006  STATE IIA,  IgA  K[facility]PA    STABLE  PER DR MOHAMED--  S/P STEM CELL TRANSPLANT 2007 /  RECURRENCE 2008  CHEMO ENDED 03-30-2008/   NOW ON MAINTANANCE REVLIMID )  . Vocal cord nodule     resolved- see Dr. Deeann Saint notes in media tab 06/17/14   . DDD (degenerative disc disease), lumbar   . Cholelithiasis   . Fatty liver   . Lung cancer ([place])     chest wall lesion aadn 6th rib    Past Surgical History  Procedure Laterality Date  . Colonoscopy  10/07/2006    ZOX:[id number] rectum/Status post right hemicolectomy. Normal residual colon  . Cervical cone biopsy    . Cataract extraction w/phaco Left 01/20/2014    Procedure: CATARACT EXTRACTION PHACO AND INTRAOCULAR LENS PLACEMENT (IOC);  Surgeon: Adonis Brook, MD;  Location: [facility];  Service: Ophthalmology;  Laterality: Left;  . 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 Co2  ablation vulva perianal and vaginal dysplatic areas  02-15-2010  . Abdominal hysterectomy  1983    w/  appendectomy and left salpingoophorectom  . Hemicolectomy Right 04-27-2005    CARCINOMA   ASCENDING COLON  . Vulvectomy N/A 05/06/2014    Procedure: WIDE LOCAL EXCISION LEFT LABIA MAJORA;  Surgeon: Janie Morning, MD;  Location: [facility];  Service: Gynecology;  Laterality: N/A;  . Vulvar lesion removal N/A 05/06/2014    Procedure: LASER OF VULVAR LESION;  Surgeon: Janie Morning, MD;  Location: [facility];  Service: Gynecology;  Laterality: N/A;  .  Lesion removal N/A 05/06/2014    Procedure: LASER OF THE VAGINA;  Surgeon: Laurette Schimke, MD;  Location: [facility];  Service: Gynecology;  Laterality: N/A;  . Esophagogastroduodenoscopy  04/02/2005    [id number] polyp with central erosion versus ulceration in the body of the stomach, resected and recovered Remainder of the gastric mucosa, D1, D2 appeared normal  . Colonoscopy  10/2009    RMR: diminutive RS polyp (hyperplastic).   . Colonoscopy N/A 12/15/2014    Procedure: COLONOSCOPY;  Surgeon: Corbin Ade, MD;  [facility];  Service: Endoscopy;  Laterality: N/A;  130   . Cholecystectomy N/A 02/28/2016    Procedure: LAPAROSCOPIC CHOLECYSTECTOMY;  Surgeon: Axel Filler, MD;   Location: [facility] OR;  Service: General;  Laterality: N/A;    has Multiple myeloma (HCC); Carcinoma in situ of colon; Diabetes mellitus type 2 in obese (HCC); Glaucoma; Essential hypertension; Allergic rhinitis; DISORDER OF BONE AND CARTILAGE UNSPECIFIED; Back pain with left-sided radiculopathy; Goiter; Abnormal LFTs; VAIN III (vaginal intraepithelial neoplasia grade III); VIN III (vulvar intraepithelial neoplasia III); GERD (gastroesophageal reflux disease); Fatty liver; H/O colon cancer, stage I; Thrombocytopenia (HCC); Insomnia; Loose stools; and Leg edema, left on her problem list.    is allergic to codeine and tramadol.    Medication List       This list is accurate as of: 07/16/16 10:20 AM.  Always use your most recent med list.               acetaminophen 500 MG tablet  Commonly known as:  TYLENOL  Take 500 mg by mouth daily as needed for moderate pain. Reported on 06/25/2016     ASPIRIN LOW DOSE 81 MG EC tablet  Generic drug:  aspirin  Take 81 mg by mouth daily.     CENTRUM SILVER PO  Take 1 tablet by mouth daily.     cholecalciferol 1000 units tablet  Commonly known as:  VITAMIN D  Take 1,000 Units by mouth daily. Reported on 04/24/2016     dexamethasone 4 MG tablet  Commonly known as:  DECADRON  Take 10 tabs (40 mg) once weekly while undergoing Velcade injections.     dorzolamide-timolol 22.3-6.8 MG/ML ophthalmic solution  Commonly known as:  COSOPT  Place 1 drop into the left eye 2 (two) times daily.     gabapentin 100 MG capsule  Commonly known as:  NEURONTIN  Take 1 capsule by mouth 3  times daily     hyaluronate sodium Gel  Apply 1 application topically 2 (two) times daily. Reported on 06/25/2016     IMODIUM A-D 2 MG tablet  Generic drug:  loperamide  Take 2 mg by mouth daily as needed for diarrhea or loose stools. Reported on 07/09/2016     loratadine 10 MG tablet  Commonly known as:  CLARITIN  TAKE ONE TABLET BY MOUTH ONCE DAILY.     LUMIGAN 0.01 % Soln    Generic drug:  bimatoprost  Place 1 drop into both eyes at bedtime.     ondansetron 4 MG disintegrating tablet  Commonly known as:  ZOFRAN ODT  Take 1 tablet (4 mg total) by mouth every 8 (eight) hours as needed for nausea or vomiting.     ondansetron 4 MG tablet  Commonly known as:  ZOFRAN  Take 1 tablet (4 mg total) by mouth every 6 (six) hours.     ONE TOUCH ULTRA TEST test strip  Generic drug:  glucose blood  USE AS  DIRECTED TO CHECK DAILY.     OSCAL 500/200 D-3 500-200 MG-UNIT tablet  Generic drug:  calcium-vitamin D  Take 1 tablet by mouth 3 (three) times daily.     oxyCODONE 5 MG immediate release tablet  Commonly known as:  ROXICODONE  Take 1 tablet (5 mg total) by mouth every 4 (four) hours as needed for severe pain.     oxyCODONE-acetaminophen 5-325 MG tablet  Commonly known as:  PERCOCET/ROXICET  Take 1 tablet by mouth every 8 (eight) hours as needed for severe pain.     pantoprazole 40 MG tablet  Commonly known as:  PROTONIX  TAKE 1 TABLET BY MOUTH  DAILY FOR ACID REFLUX     potassium chloride SA 20 MEQ tablet  Commonly known as:  K-DUR,KLOR-CON  Take 1 tablet by mouth  twice a day     PREMARIN vaginal cream  Generic drug:  conjugated estrogens  PLACE 1 APPLICATORFUL VAGINALLY TWICE A WEEK.     temazepam 15 MG capsule  Commonly known as:  RESTORIL  Take 1 capsule (15 mg total) by mouth at bedtime as needed for sleep.     valACYclovir 1000 MG tablet  Commonly known as:  VALTREX  Take 1 tablet by mouth  daily as needed     valsartan-hydrochlorothiazide 320-25 MG tablet  Commonly known as:  DIOVAN-HCT  Take 1 tablet by mouth  daily         PHYSICAL EXAMINATION  Oncology Vitals 07/16/2016 07/09/2016  Height - 168 cm  Weight - 83.416 kg  Weight (lbs) - 183 lbs 14 oz  BMI (kg/m2) - 29.68 kg/m2  Temp 98.1 97.7  Pulse 82 97  Resp 18 18  SpO2 100 99  BSA (m2) - 1.97 m2   BP Readings from Last 2 Encounters:  07/16/16 139/68  07/09/16 147/69     Physical Exam  Constitutional: She is oriented to person, place, and time and well-developed, well-nourished, and in no distress.  HENT:  Head: Normocephalic and atraumatic.  Eyes: Conjunctivae and EOM are normal. Pupils are equal, round, and reactive to light. Right eye exhibits no discharge. Left eye exhibits no discharge. No scleral icterus.  Neck: Normal range of motion.  Pulmonary/Chest: Effort normal. No respiratory distress.  Musculoskeletal: Normal range of motion. She exhibits edema. She exhibits no tenderness.  Left leg edema. No calf tenderness.   Neurological: She is alert and oriented to person, place, and time. Gait normal.  Skin: Skin is warm and dry. No rash noted. No erythema. No pallor.  Psychiatric: Affect normal.  Nursing note and vitals reviewed.   LABORATORY DATA:. Appointment on 07/16/2016  Component Date Value Ref Range Status  . WBC 07/16/2016 11.7* 3.9 - 10.3 10e3/uL Final  . NEUT# 07/16/2016 9.4* 1.5 - 6.5 10e3/uL Final  . HGB 07/16/2016 8.4* 11.6 - 15.9 g/dL Final  . HCT 07/16/2016 26.4* 34.8 - 46.6 % Final  . Platelets 07/16/2016 52* 145 - 400 10e3/uL Final  . MCV 07/16/2016 96.0  79.5 - 101.0 fL Final  . MCH 07/16/2016 30.5  25.1 - 34.0 pg Final  . MCHC 07/16/2016 31.8  31.5 - 36.0 g/dL Final  . RBC 07/16/2016 2.75* 3.70 - 5.45 10e6/uL Final  . RDW 07/16/2016 24.6* 11.2 - 14.5 % Final  . lymph# 07/16/2016 1.3  0.9 - 3.3 10e3/uL Final  . MONO# 07/16/2016 0.9  0.1 - 0.9 10e3/uL Final  . Eosinophils Absolute 07/16/2016 0.1  0.0 - 0.5 10e3/uL Final  . Basophils Absolute  07/16/2016 0.0  0.0 - 0.1 10e3/uL Final  . NEUT% 07/16/2016 80.5* 38.4 - 76.8 % Final  . LYMPH% 07/16/2016 11.0* 14.0 - 49.7 % Final  . MONO% 07/16/2016 7.4  0.0 - 14.0 % Final  . EOS% 07/16/2016 0.9  0.0 - 7.0 % Final  . BASO% 07/16/2016 0.2  0.0 - 2.0 % Final  . nRBC 07/16/2016 1* 0 - 0 % Final  . Sodium 07/16/2016 144  136 - 145 mEq/L Final  . Potassium 07/16/2016 3.7  3.5 -  5.1 mEq/L Final  . Chloride 07/16/2016 106  98 - 109 mEq/L Final  . CO2 07/16/2016 28  22 - 29 mEq/L Final  . Glucose 07/16/2016 108  70 - 140 mg/dl Final   Glucose reference range is for nonfasting patients. Fasting glucose reference range is 70- 100.  Marland Kitchen BUN 07/16/2016 8.3  7.0 - 26.0 mg/dL Final  . Creatinine 07/16/2016 0.8  0.6 - 1.1 mg/dL Final  . Total Bilirubin 07/16/2016 0.83  0.20 - 1.20 mg/dL Final  . Alkaline Phosphatase 07/16/2016 77  40 - 150 U/L Final  . AST 07/16/2016 24  5 - 34 U/L Final  . ALT 07/16/2016 31  0 - 55 U/L Final  . Total Protein 07/16/2016 6.4  6.4 - 8.3 g/dL Final  . Albumin 07/16/2016 2.8* 3.5 - 5.0 g/dL Final  . Calcium 07/16/2016 9.0  8.4 - 10.4 mg/dL Final  . Anion Gap 07/16/2016 10  3 - 11 mEq/L Final  . EGFR 07/16/2016 87* >90 ml/min/1.73 m2 Final   eGFR is calculated using the CKD-EPI Creatinine Equation (2009)  . Technologist Review 07/16/2016 1% myelocyte.   Final    RADIOGRAPHIC STUDIES: No results found.  ASSESSMENT/PLAN:    Multiple myeloma [facility]) Patient presented to the [place] today to receive cycle 4 of her weekly Velcade injections.  She states that she's been doing fairly well recently; with her only complaint some left leg edema for the past few days.  See further notes for details.  Patient also continues to receive Zometa infusions on an every three-month basis.  Her last Zometa infusion was 05/07/2016.  Her next Zometa infusion should be scheduled for sometime around 08/07/2016.  Blood counts obtained today reveal a WBC of 9.0, ANC 6.8, hemoglobin 9.3, and platelet count 64.  Patient is scheduled to return for labs and her next Velcade injection on 07/23/2016.  She is scheduled for labs, visit, and a Velcade injection again on 07/30/2016.    Leg edema, left Patient states that she has noticed some increased left leg edema for the past few days.  She denies any known injury or trauma to her leg.  She denies any calf pain.   She denies any chest pain, chest pressure, shortness of breath, or pain with inspiration.  Exam today reveals a left leg edema; but no erythema, warmth, tenderness, or red streaks.  Arrange for patient to undergo a Doppler ultrasound at 1 PM today.  Since patient has finished her Velcade injection.  This morning-she states that she will leave the hospital return at 1 PM.  Patient was encouraged to go directly to the emergency department for any worsening symptoms in the meantime.   Patient stated understanding of all instructions; and was in agreement with this plan of care. The patient knows to call the clinic with any problems, questions or concerns.   Total time spent with patient was 25 minutes;  with greater than 75 percent of that time spent  in face to face counseling regarding patient's symptoms,  and coordination of care and follow up.  Disclaimer:This dictation was prepared with Dragon/digital dictation along with [employer]. Any transcriptional errors that result from this process are unintentional.  Drue Second, NP 07/16/2016

## 2016-07-16 NOTE — Progress Notes (Signed)
0830: Upon assessment, pt mentioned she noticed swelling in her left ankle and foot.  Pt reports she noticed it Saturday and feels it has worsened yesterday and today.  Pt denies any pain in this area and denies any known injury.  LLE slightly warmer than upper leg and right leg.  Dr. [name] notified and wants pt to be seen by Selena Lesser, NP and for pt to have doppler of this extremity.  Stanton Kidney, collaborative RN notified of plan of care.     0840: Okay to treat today with platelet count of 52 per Dr. [name].   [id number] Lesser, NP arrived chairside for evaluation.  Per [place], pt to leave infusion and have doppler of left lower extremity.

## 2016-07-18 ENCOUNTER — Telehealth: Payer: Self-pay | Admitting: Medical Oncology

## 2016-07-18 NOTE — Telephone Encounter (Signed)
Form signed by [name] and faxed to [facility]

## 2016-07-19 ENCOUNTER — Telehealth: Payer: Self-pay

## 2016-07-19 NOTE — Telephone Encounter (Signed)
Spoke with patient regarding leg pain from 7/17 visit.  Pt states the she is feeling much better.  Swelling is down and she is "doing great".  Advised pt to call u s if anything changes betweekn now and next week when she returns for a velcade appt 7/24.  Pt verbalized understanding.

## 2016-07-20 ENCOUNTER — Encounter: Payer: Self-pay | Admitting: Internal Medicine

## 2016-07-20 NOTE — Progress Notes (Signed)
Received fax from [facility] for physician to sign. Delivered to Mary(RN) for FDr.Mohamed to sign when he returns and have sent back to me to fax.

## 2016-07-23 ENCOUNTER — Ambulatory Visit (HOSPITAL_BASED_OUTPATIENT_CLINIC_OR_DEPARTMENT_OTHER): Payer: Medicare Other

## 2016-07-23 ENCOUNTER — Other Ambulatory Visit (HOSPITAL_BASED_OUTPATIENT_CLINIC_OR_DEPARTMENT_OTHER): Payer: Medicare Other

## 2016-07-23 VITALS — BP 152/88 | HR 61 | Temp 98.6°F | Resp 18

## 2016-07-23 DIAGNOSIS — Z5112 Encounter for antineoplastic immunotherapy: Secondary | ICD-10-CM

## 2016-07-23 DIAGNOSIS — C9000 Multiple myeloma not having achieved remission: Secondary | ICD-10-CM | POA: Diagnosis present

## 2016-07-23 LAB — CBC WITH DIFFERENTIAL/PLATELET
BASO%: 0.3 % (ref 0.0–2.0)
Basophils Absolute: 0 10*3/uL (ref 0.0–0.1)
EOS%: 0.9 % (ref 0.0–7.0)
Eosinophils Absolute: 0.1 10*3/uL (ref 0.0–0.5)
HEMATOCRIT: 27.3 % — AB (ref 34.8–46.6)
HEMOGLOBIN: 8.6 g/dL — AB (ref 11.6–15.9)
LYMPH#: 0.5 10*3/uL — AB (ref 0.9–3.3)
LYMPH%: 4.6 % — ABNORMAL LOW (ref 14.0–49.7)
MCH: 30.6 pg (ref 25.1–34.0)
MCHC: 31.3 g/dL — ABNORMAL LOW (ref 31.5–36.0)
MCV: 97.5 fL (ref 79.5–101.0)
MONO#: 1.5 10*3/uL — ABNORMAL HIGH (ref 0.1–0.9)
MONO%: 12.8 % (ref 0.0–14.0)
NEUT%: 81.4 % — ABNORMAL HIGH (ref 38.4–76.8)
NEUTROS ABS: 9.3 10*3/uL — AB (ref 1.5–6.5)
Platelets: 65 10*3/uL — ABNORMAL LOW (ref 145–400)
RBC: 2.8 10*6/uL — ABNORMAL LOW (ref 3.70–5.45)
RDW: 27.3 % — AB (ref 11.2–14.5)
WBC: 11.5 10*3/uL — AB (ref 3.9–10.3)

## 2016-07-23 LAB — COMPREHENSIVE METABOLIC PANEL
ALBUMIN: 2.9 g/dL — AB (ref 3.5–5.0)
ALK PHOS: 71 U/L (ref 40–150)
ALT: 15 U/L (ref 0–55)
AST: 18 U/L (ref 5–34)
Anion Gap: 11 mEq/L (ref 3–11)
BILIRUBIN TOTAL: 0.91 mg/dL (ref 0.20–1.20)
BUN: 14.8 mg/dL (ref 7.0–26.0)
CALCIUM: 8.7 mg/dL (ref 8.4–10.4)
CO2: 27 mEq/L (ref 22–29)
CREATININE: 0.9 mg/dL (ref 0.6–1.1)
Chloride: 106 mEq/L (ref 98–109)
EGFR: 79 mL/min/{1.73_m2} — ABNORMAL LOW (ref >90)
GLUCOSE: 111 mg/dL (ref 70–140)
POTASSIUM: 3.7 meq/L (ref 3.5–5.1)
SODIUM: 144 meq/L (ref 136–145)
TOTAL PROTEIN: 6.6 g/dL (ref 6.4–8.3)

## 2016-07-23 MED ORDER — PROCHLORPERAZINE MALEATE 10 MG PO TABS
10.0000 mg | ORAL_TABLET | Freq: Once | ORAL | Status: AC
  Administered 2016-07-23: 10 mg via ORAL

## 2016-07-23 MED ORDER — SODIUM CHLORIDE 0.9 % IV SOLN
Freq: Once | INTRAVENOUS | Status: AC
  Administered 2016-07-23: 09:00:00 via INTRAVENOUS

## 2016-07-23 MED ORDER — BORTEZOMIB CHEMO SQ INJECTION 3.5 MG (2.5MG/ML)
1.3000 mg/m2 | Freq: Once | INTRAMUSCULAR | Status: AC
  Administered 2016-07-23: 2.5 mg via SUBCUTANEOUS
  Filled 2016-07-23: qty 2.5

## 2016-07-23 MED ORDER — ZOLEDRONIC ACID 4 MG/100ML IV SOLN
4.0000 mg | Freq: Once | INTRAVENOUS | Status: AC
  Administered 2016-07-23: 4 mg via INTRAVENOUS
  Filled 2016-07-23: qty 100

## 2016-07-23 NOTE — Patient Instructions (Signed)
Zoledronic Acid injection (Hypercalcemia, Oncology)  What is this medicine?  ZOLEDRONIC ACID (ZOE le dron ik AS id) lowers the amount of calcium loss from bone. It is used to treat too much calcium in your blood from cancer. It is also used to prevent complications of cancer that has spread to the bone.  This medicine may be used for other purposes; ask your health care provider or pharmacist if you have questions.  What should I tell my health care provider before I take this medicine?  They need to know if you have any of these conditions:  -aspirin-sensitive asthma  -cancer, especially if you are receiving medicines used to treat cancer  -dental disease or wear dentures  -infection  -kidney disease  -receiving corticosteroids like dexamethasone or prednisone  -an unusual or allergic reaction to zoledronic acid, other medicines, foods, dyes, or preservatives  -pregnant or trying to get pregnant  -breast-feeding  How should I use this medicine?  This medicine is for infusion into a vein. It is given by a health care professional in a hospital or clinic setting.  Talk to your pediatrician regarding the use of this medicine in children. Special care may be needed.  Overdosage: If you think you have taken too much of this medicine contact a poison control center or emergency room at once.  NOTE: This medicine is only for you. Do not share this medicine with others.  What if I miss a dose?  It is important not to miss your dose. Call your doctor or health care professional if you are unable to keep an appointment.  What may interact with this medicine?  -certain antibiotics given by injection  -NSAIDs, medicines for pain and inflammation, like ibuprofen or naproxen  -some diuretics like bumetanide, furosemide  -teriparatide  -thalidomide  This list may not describe all possible interactions. Give your health care provider a list of all the medicines, herbs, non-prescription drugs, or dietary supplements you use. Also  tell them if you smoke, drink alcohol, or use illegal drugs. Some items may interact with your medicine.  What should I watch for while using this medicine?  Visit your doctor or health care professional for regular checkups. It may be some time before you see the benefit from this medicine. Do not stop taking your medicine unless your doctor tells you to. Your doctor may order blood tests or other tests to see how you are doing.  Women should inform their doctor if they wish to become pregnant or think they might be pregnant. There is a potential for serious side effects to an unborn child. Talk to your health care professional or pharmacist for more information.  You should make sure that you get enough calcium and vitamin D while you are taking this medicine. Discuss the foods you eat and the vitamins you take with your health care professional.  Some people who take this medicine have severe bone, joint, and/or muscle pain. This medicine may also increase your risk for jaw problems or a broken thigh bone. Tell your doctor right away if you have severe pain in your jaw, bones, joints, or muscles. Tell your doctor if you have any pain that does not go away or that gets worse.  Tell your dentist and dental surgeon that you are taking this medicine. You should not have major dental surgery while on this medicine. See your dentist to have a dental exam and fix any dental problems before starting this medicine. Take good care   of your teeth while on this medicine. Make sure you see your dentist for regular follow-up appointments.  What side effects may I notice from receiving this medicine?  Side effects that you should report to your doctor or health care professional as soon as possible:  -allergic reactions like skin rash, itching or hives, swelling of the face, lips, or tongue  -anxiety, confusion, or depression  -breathing problems  -changes in vision  -eye pain  -feeling faint or lightheaded, falls  -jaw pain,  especially after dental work  -mouth sores  -muscle cramps, stiffness, or weakness  -redness, blistering, peeling or loosening of the skin, including inside the mouth  -trouble passing urine or change in the amount of urine  Side effects that usually do not require medical attention (report to your doctor or health care professional if they continue or are bothersome):  -bone, joint, or muscle pain  -constipation  -diarrhea  -fever  -hair loss  -irritation at site where injected  -loss of appetite  -nausea, vomiting  -stomach upset  -trouble sleeping  -trouble swallowing  -weak or tired  This list may not describe all possible side effects. Call your doctor for medical advice about side effects. You may report side effects to FDA at 1-800-FDA-1088.  Where should I keep my medicine?  This drug is given in a hospital or clinic and will not be stored at home.  NOTE: This sheet is a summary. It may not cover all possible information. If you have questions about this medicine, talk to your doctor, pharmacist, or health care provider.      2016, Elsevier/Gold Standard. (2014-05-15 14:19:39)  Bortezomib injection  What is this medicine?  BORTEZOMIB (bor TEZ oh mib) is a medicine that targets proteins in cancer cells and stops the cancer cells from growing. It is used to treat multiple myeloma and mantle-cell lymphoma.  This medicine may be used for other purposes; ask your health care provider or pharmacist if you have questions.  What should I tell my health care provider before I take this medicine?  They need to know if you have any of these conditions:  -diabetes  -heart disease  -irregular heartbeat  -liver disease  -on hemodialysis  -low blood counts, like low white blood cells, platelets, or hemoglobin  -peripheral neuropathy  -taking medicine for blood pressure  -an unusual or allergic reaction to bortezomib, mannitol, boron, other medicines, foods, dyes, or preservatives  -pregnant or trying to get  pregnant  -breast-feeding  How should I use this medicine?  This medicine is for injection into a vein or for injection under the skin. It is given by a health care professional in a hospital or clinic setting.  Talk to your pediatrician regarding the use of this medicine in children. Special care may be needed.  Overdosage: If you think you have taken too much of this medicine contact a poison control center or emergency room at once.  NOTE: This medicine is only for you. Do not share this medicine with others.  What if I miss a dose?  It is important not to miss your dose. Call your doctor or health care professional if you are unable to keep an appointment.  What may interact with this medicine?  This medicine may interact with the following medications:  -ketoconazole  -rifampin  -ritonavir  -St. John's Wort  This list may not describe all possible interactions. Give your health care provider a list of all the medicines, herbs, non-prescription drugs,   or dietary supplements you use. Also tell them if you smoke, drink alcohol, or use illegal drugs. Some items may interact with your medicine.  What should I watch for while using this medicine?  Visit your doctor for checks on your progress. This drug may make you feel generally unwell. This is not uncommon, as chemotherapy can affect healthy cells as well as cancer cells. Report any side effects. Continue your course of treatment even though you feel ill unless your doctor tells you to stop.  You may get drowsy or dizzy. Do not drive, use machinery, or do anything that needs mental alertness until you know how this medicine affects you. Do not stand or sit up quickly, especially if you are an older patient. This reduces the risk of dizzy or fainting spells.  In some cases, you may be given additional medicines to help with side effects. Follow all directions for their use.  Call your doctor or health care professional for advice if you get a fever, chills or sore  throat, or other symptoms of a cold or flu. Do not treat yourself. This drug decreases your body's ability to fight infections. Try to avoid being around people who are sick.  This medicine may increase your risk to bruise or bleed. Call your doctor or health care professional if you notice any unusual bleeding.  You may need blood work done while you are taking this medicine.  In some patients, this medicine may cause a serious brain infection that may cause death. If you have any problems seeing, thinking, speaking, walking, or standing, tell your doctor right away. If you cannot reach your doctor, urgently seek other source of medical care.  Do not become pregnant while taking this medicine. Women should inform their doctor if they wish to become pregnant or think they might be pregnant. There is a potential for serious side effects to an unborn child. Talk to your health care professional or pharmacist for more information. Do not breast-feed an infant while taking this medicine.  Check with your doctor or health care professional if you get an attack of severe diarrhea, nausea and vomiting, or if you sweat a lot. The loss of too much body fluid can make it dangerous for you to take this medicine.  What side effects may I notice from receiving this medicine?  Side effects that you should report to your doctor or health care professional as soon as possible:  -allergic reactions like skin rash, itching or hives, swelling of the face, lips, or tongue  -breathing problems  -changes in hearing  -changes in vision  -fast, irregular heartbeat  -feeling faint or lightheaded, falls  -pain, tingling, numbness in the hands or feet  -right upper belly pain  -seizures  -swelling of the ankles, feet, hands  -unusual bleeding or bruising  -unusually weak or tired  -vomiting  -yellowing of the eyes or skin  Side effects that usually do not require medical attention (report to your doctor or health care professional if they  continue or are bothersome):  -changes in emotions or moods  -constipation  -diarrhea  -loss of appetite  -headache  -irritation at site where injected  -nausea  This list may not describe all possible side effects. Call your doctor for medical advice about side effects. You may report side effects to FDA at 1-800-FDA-1088.  Where should I keep my medicine?  This drug is given in a hospital or clinic and will not be stored at home.

## 2016-07-23 NOTE — Progress Notes (Signed)
0900:  OK to treat with plt 65 per Dr [name]

## 2016-07-24 ENCOUNTER — Encounter: Payer: Self-pay | Admitting: Internal Medicine

## 2016-07-24 NOTE — Progress Notes (Unsigned)
Received signed physician form from Dr.Mohamed for [facility]. Faxed to [facility]. Fax received ok per confirmation sheet.

## 2016-07-30 ENCOUNTER — Ambulatory Visit (HOSPITAL_BASED_OUTPATIENT_CLINIC_OR_DEPARTMENT_OTHER): Payer: Medicare Other

## 2016-07-30 ENCOUNTER — Encounter: Payer: Self-pay | Admitting: Internal Medicine

## 2016-07-30 ENCOUNTER — Telehealth: Payer: Self-pay | Admitting: Internal Medicine

## 2016-07-30 ENCOUNTER — Other Ambulatory Visit (HOSPITAL_BASED_OUTPATIENT_CLINIC_OR_DEPARTMENT_OTHER): Payer: Medicare Other

## 2016-07-30 ENCOUNTER — Ambulatory Visit (HOSPITAL_BASED_OUTPATIENT_CLINIC_OR_DEPARTMENT_OTHER): Payer: Medicare Other | Admitting: Internal Medicine

## 2016-07-30 VITALS — BP 146/83 | HR 102 | Temp 98.1°F | Resp 17 | Ht 66.0 in | Wt 183.4 lb

## 2016-07-30 DIAGNOSIS — C9000 Multiple myeloma not having achieved remission: Secondary | ICD-10-CM

## 2016-07-30 DIAGNOSIS — R5383 Other fatigue: Secondary | ICD-10-CM

## 2016-07-30 DIAGNOSIS — D696 Thrombocytopenia, unspecified: Secondary | ICD-10-CM

## 2016-07-30 DIAGNOSIS — C9002 Multiple myeloma in relapse: Secondary | ICD-10-CM

## 2016-07-30 DIAGNOSIS — Z5112 Encounter for antineoplastic immunotherapy: Secondary | ICD-10-CM

## 2016-07-30 LAB — COMPREHENSIVE METABOLIC PANEL
ALT: 47 U/L (ref 0–55)
ANION GAP: 9 meq/L (ref 3–11)
AST: 48 U/L — ABNORMAL HIGH (ref 5–34)
Albumin: 3 g/dL — ABNORMAL LOW (ref 3.5–5.0)
Alkaline Phosphatase: 104 U/L (ref 40–150)
BUN: 12.2 mg/dL (ref 7.0–26.0)
CHLORIDE: 107 meq/L (ref 98–109)
CO2: 27 meq/L (ref 22–29)
CREATININE: 0.8 mg/dL (ref 0.6–1.1)
Calcium: 9.4 mg/dL (ref 8.4–10.4)
EGFR: 83 mL/min/{1.73_m2} — ABNORMAL LOW (ref >90)
Glucose: 115 mg/dl (ref 70–140)
Potassium: 4.2 mEq/L (ref 3.5–5.1)
Sodium: 143 mEq/L (ref 136–145)
Total Bilirubin: 1.07 mg/dL (ref 0.20–1.20)
Total Protein: 6.8 g/dL (ref 6.4–8.3)

## 2016-07-30 LAB — CBC WITH DIFFERENTIAL/PLATELET
BASO%: 0.2 % (ref 0.0–2.0)
Basophils Absolute: 0 10*3/uL (ref 0.0–0.1)
EOS%: 0.6 % (ref 0.0–7.0)
Eosinophils Absolute: 0.1 10*3/uL (ref 0.0–0.5)
HCT: 28.3 % — ABNORMAL LOW (ref 34.8–46.6)
HGB: 8.8 g/dL — ABNORMAL LOW (ref 11.6–15.9)
LYMPH%: 4.9 % — AB (ref 14.0–49.7)
MCH: 30.7 pg (ref 25.1–34.0)
MCHC: 30.9 g/dL — AB (ref 31.5–36.0)
MCV: 99.3 fL (ref 79.5–101.0)
MONO#: 1.3 10*3/uL — AB (ref 0.1–0.9)
MONO%: 11.8 % (ref 0.0–14.0)
NEUT#: 9.3 10*3/uL — ABNORMAL HIGH (ref 1.5–6.5)
NEUT%: 82.5 % — AB (ref 38.4–76.8)
PLATELETS: 73 10*3/uL — AB (ref 145–400)
RBC: 2.85 10*6/uL — AB (ref 3.70–5.45)
RDW: 27.5 % — ABNORMAL HIGH (ref 11.2–14.5)
WBC: 11.3 10*3/uL — ABNORMAL HIGH (ref 3.9–10.3)
lymph#: 0.6 10*3/uL — ABNORMAL LOW (ref 0.9–3.3)

## 2016-07-30 MED ORDER — PROCHLORPERAZINE MALEATE 10 MG PO TABS
ORAL_TABLET | ORAL | Status: AC
  Filled 2016-07-30: qty 1

## 2016-07-30 MED ORDER — PROCHLORPERAZINE MALEATE 10 MG PO TABS
10.0000 mg | ORAL_TABLET | Freq: Once | ORAL | Status: AC
  Administered 2016-07-30: 10 mg via ORAL

## 2016-07-30 MED ORDER — BORTEZOMIB CHEMO SQ INJECTION 3.5 MG (2.5MG/ML)
1.3000 mg/m2 | Freq: Once | INTRAMUSCULAR | Status: AC
  Administered 2016-07-30: 2.5 mg via SUBCUTANEOUS
  Filled 2016-07-30: qty 2.5

## 2016-07-30 NOTE — Patient Instructions (Signed)
[facility] Discharge Instructions for Patients Receiving Chemotherapy  Today you received the following chemotherapy agents Velcade. To help prevent nausea and vomiting after your treatment, we encourage you to take your nausea medication as directed.  If you develop nausea and vomiting that is not controlled by your nausea medication, call the clinic.   BELOW ARE SYMPTOMS THAT SHOULD BE REPORTED IMMEDIATELY:  *FEVER GREATER THAN 100.5 F  *CHILLS WITH OR WITHOUT FEVER  NAUSEA AND VOMITING THAT IS NOT CONTROLLED WITH YOUR NAUSEA MEDICATION  *UNUSUAL SHORTNESS OF BREATH  *UNUSUAL BRUISING OR BLEEDING  TENDERNESS IN MOUTH AND THROAT WITH OR WITHOUT PRESENCE OF ULCERS  *URINARY PROBLEMS  *BOWEL PROBLEMS  UNUSUAL RASH Items with * indicate a potential emergency and should be followed up as soon as possible.  Feel free to call the clinic you have any questions or concerns. The clinic phone number is [phone].  Please show the CHEMO ALERT CARD at check-in to the Emergency Department and triage nurse.    

## 2016-07-30 NOTE — Telephone Encounter (Signed)
Gave pt cal & avs °

## 2016-07-30 NOTE — Progress Notes (Signed)
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  2) the patient had evidence for disease recurrence in August of 2008 and she was treated with 6 cycles of Velcade completed on 03/30/2008 with response to the treatment but was discontinued secondary to neuropathy.  3) maintenance treatment with Revlimid 10 mg by mouth daily by the does was later increase to 15 mg by mouth daily secondary to biochemical recurrence with stabilization of her disease. The treatment with Revlimid 15 mg by mouth daily was discontinued secondary to evidence for disease progression. 4) Revlimid 25 mg by mouth daily for 3 weeks every 4 weeks in addition to Decadron currently at 20 mg by mouth on a weekly basis. She is status post 12 cycles. This was discontinued secondary to disease progression. 5) systemic chemotherapy with Daratumumab 16 MG/KG weekly for the first 9 weeks, Velcade 1.3 MG/M2 subcutaneously on days 1, 4, 8 and 11 every 3 weeks in addition to Decadron 20 mg by mouth on days 1, 2, 4, 5, 8, 9, 11 and 12 every 3 weeks. First dose 05/07/2016. Status post one cycle discontinued secondary to intolerance.   CURRENT THERAPY:  1) systemic chemotherapy with Velcade 1.3 MG/M2 subcutaneously weekly in addition to Decadron 40 mg by mouth daily. 2) Zometa 4 mg IV every 3 months. First dose in 05/05/2015  ADVANCED DIRECTIVE: She does not have advanced directive and was given information.  INTERVAL HISTORY: Darlene Howard 65 y.o. female returns to the clinic today for routine monthly followup visit accompanied by several family members. The  patient is doing fine today with no specific complaints. She continues to tolerate her treatment with weekly subcutaneous Velcade and Decadron fairly well. She status post 5 cycles. The patient denied having any fever or chills. She denied having any shortness of breath, cough or hemoptysis. She has no weight loss or night sweats. She is here today for evaluation before starting cycle #6  MEDICAL HISTORY: Past Medical History:  Diagnosis Date  . Bruises easily    d/t being on COumadin  . Cholelithiasis   . DDD (degenerative disc disease), lumbar   . Fatty liver   . GERD (gastroesophageal reflux disease)    takes Pantoprazole daily  . Glaucoma    uses eye drops as instructed  . H/O stem cell transplant ([place])    04-26-2006  AT [facility]  . Herpes    takes Valtrex as needed  . History of colon polyps   . Hypertension    takes Amlodipine and Diovan daily  . Lung cancer ([place])    chest wall lesion aadn 6th rib  . Multiple myeloma DX  2006  STATE IIA,  IgA  KAPPA   STABLE  PER DR Sayre Mazor--  S/P STEM CELL TRANSPLANT 2007 /  RECURRENCE 2008  CHEMO ENDED 03-30-2008/   NOW ON MAINTANANCE REVLIMID )  . 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 Personal history of colon cancer    ASCENDING COLON--  S/P  RIGHT HEMICOLECTOMY WITH NEGATIVE NODES/    NO RECURRENCE  . Type 2 diabetes mellitus (  [place])   . Vocal cord nodule    resolved- see Dr. Deeann Saint notes in media tab 06/17/14    ALLERGIES:  is allergic to codeine and tramadol.  MEDICATIONS:  Current Outpatient Prescriptions  Medication Sig Dispense Refill  . acetaminophen (TYLENOL) 500 MG tablet Take 500 mg by mouth daily as needed for moderate pain. Reported on 06/25/2016    . aspirin (ASPIRIN LOW DOSE) 81 MG EC tablet Take 81 mg by mouth daily.      . calcium-vitamin D (OSCAL 500/200 D-3) 500-200 MG-UNIT per tablet Take 1 tablet by mouth 3 (three) times daily.      . cholecalciferol (VITAMIN  D) 1000 UNITS tablet Take 1,000 Units by mouth daily. Reported on 04/24/2016    . dexamethasone (DECADRON) 4 MG tablet Take 10 tabs (40 mg) once weekly while undergoing Velcade injections. 40 tablet 2  . dorzolamide-timolol (COSOPT) 22.3-6.8 MG/ML ophthalmic solution Place 1 drop into the left eye 2 (two) times daily.     Marland Kitchen gabapentin (NEURONTIN) 100 MG capsule Take 1 capsule by mouth 3  times daily 270 capsule 1  . hyaluronate sodium (RADIAPLEXRX) GEL Apply 1 application topically 2 (two) times daily. Reported on 06/25/2016    . loperamide (IMODIUM A-D) 2 MG tablet Take 2 mg by mouth daily as needed for diarrhea or loose stools. Reported on 07/09/2016    . loratadine (CLARITIN) 10 MG tablet TAKE ONE TABLET BY MOUTH ONCE DAILY. (Patient taking differently: TAKE 10 MG BY MOUTH ONCE DAILY.) 30 tablet 3  . LUMIGAN 0.01 % SOLN Place 1 drop into both eyes at bedtime.     . Multiple Vitamins-Minerals (CENTRUM SILVER PO) Take 1 tablet by mouth daily.     . ondansetron (ZOFRAN ODT) 4 MG disintegrating tablet Take 1 tablet (4 mg total) by mouth every 8 (eight) hours as needed for nausea or vomiting. (Patient not taking: Reported on 06/25/2016) 20 tablet 0  . ondansetron (ZOFRAN) 4 MG tablet Take 1 tablet (4 mg total) by mouth every 6 (six) hours. (Patient not taking: Reported on 06/25/2016) 10 tablet 0  . ONE TOUCH ULTRA TEST test strip USE AS DIRECTED TO CHECK DAILY. (Patient not taking: Reported on 07/09/2016) 50 each 3  . oxyCODONE (ROXICODONE) 5 MG immediate release tablet Take 1 tablet (5 mg total) by mouth every 4 (four) hours as needed for severe pain. (Patient not taking: Reported on 06/25/2016) 30 tablet 0  . oxyCODONE-acetaminophen (PERCOCET/ROXICET) 5-325 MG tablet Take 1 tablet by mouth every 8 (eight) hours as needed for severe pain. (Patient not taking: Reported on 06/25/2016) 40 tablet 0  . pantoprazole (PROTONIX) 40 MG tablet TAKE 1 TABLET BY MOUTH  DAILY FOR ACID REFLUX 90 tablet 1  . potassium  chloride SA (K-DUR,KLOR-CON) 20 MEQ tablet Take 1 tablet by mouth  twice a day (Patient taking differently: Take 20 meq by mouth twice daily) 180 tablet 0  . PREMARIN vaginal cream PLACE 1 APPLICATORFUL VAGINALLY TWICE A WEEK. 30 g 0  . temazepam (RESTORIL) 15 MG capsule Take 1 capsule (15 mg total) by mouth at bedtime as needed for sleep. 30 capsule 1  . valACYclovir (VALTREX) 1000 MG tablet Take 1 tablet by mouth  daily as needed 90 tablet 0  . valsartan-hydrochlorothiazide (DIOVAN-HCT) 320-25 MG tablet Take 1 tablet by mouth  daily 90 tablet 1   No current facility-administered medications for this visit.     SURGICAL HISTORY:  Past Surgical History:  Procedure Laterality Date  . ABDOMINAL  HYSTERECTOMY  1983   w/  appendectomy and left salpingoophorectom  . CATARACT EXTRACTION W/PHACO Left 01/20/2014   Procedure: CATARACT EXTRACTION PHACO AND INTRAOCULAR LENS PLACEMENT (IOC);  Surgeon: Adonis Brook, MD;  Location: [facility];  Service: Ophthalmology;  Laterality: Left;  . CERVICAL CONE BIOPSY    . CHOLECYSTECTOMY N/A 02/28/2016   Procedure: LAPAROSCOPIC CHOLECYSTECTOMY;  Surgeon: Ralene Ok, MD;  Location: [facility];  Service: General;  Laterality: N/A;  . CO2  ABLATION VULVA PERIANAL AND VAGINAL DYSPLATIC AREAS  02-15-2010  . COLONOSCOPY  10/07/2006   ZSM:OLMBEM rectum/Status post right hemicolectomy. Normal residual colon  . COLONOSCOPY  10/2009   RMR: diminutive RS polyp (hyperplastic).   . COLONOSCOPY N/A 12/15/2014   Procedure: COLONOSCOPY;  Surgeon: Daneil Dolin, MD;  [facility];  Service: Endoscopy;  Laterality: N/A;  130   . ESOPHAGOGASTRODUODENOSCOPY  04/02/2005   LJQ:GBEEFE/OFHQRFXJOITG polyp with central erosion versus ulceration in the body of the stomach, resected and recovered Remainder of the gastric mucosa, D1, D2 appeared normal  . HEMICOLECTOMY Right 04-27-2005   CARCINOMA   ASCENDING COLON  . LESION REMOVAL N/A 05/06/2014   Procedure: LASER OF THE VAGINA;   Surgeon: Janie Morning, MD;  Location: [facility];  Service: Gynecology;  Laterality: N/A;  . LIMBAL STEM CELL TRANSPLANT  04-26-2006    ([facility])  . VULVAR LESION REMOVAL N/A 05/06/2014   Procedure: LASER OF VULVAR LESION;  Surgeon: Janie Morning, MD;  Location: [facility];  Service: Gynecology;  Laterality: N/A;  . VULVECTOMY N/A 05/06/2014   Procedure: WIDE LOCAL EXCISION LEFT LABIA MAJORA;  Surgeon: Janie Morning, MD;  Location: [facility];  Service: Gynecology;  Laterality: N/A;    REVIEW OF SYSTEMS:  A comprehensive review of systems was negative except for: Constitutional: positive for fatigue   PHYSICAL EXAMINATION: General appearance: alert, cooperative and no distress Head: Normocephalic, without obvious abnormality, atraumatic Neck: no adenopathy Lymph nodes: Cervical, supraclavicular, and axillary nodes normal. Resp: clear to auscultation bilaterally Back: symmetric, no curvature. ROM normal. No CVA tenderness. Cardio: regular rate and rhythm, S1, S2 normal, no murmur, click, rub or gallop GI: soft, non-tender; bowel sounds normal; no masses,  no organomegaly Extremities: extremities normal, atraumatic, no cyanosis or edema Neurologic: Alert and oriented X 3, normal strength and tone. Normal symmetric reflexes. Normal coordination and gait  ECOG PERFORMANCE STATUS: 1 - Symptomatic but completely ambulatory  Blood pressure (!) 146/83, pulse (!) 102, temperature 98.1 F (36.7 C), temperature source Oral, resp. rate 17, height '5\' 6"'  (1.676 m), weight 183 lb 6.4 oz (83.2 kg), SpO2 97 %.  LABORATORY DATA: Lab Results  Component Value Date   WBC 11.3 (H) 07/30/2016   HGB 8.8 (L) 07/30/2016   HCT 28.3 (L) 07/30/2016   MCV 99.3 07/30/2016   PLT 73 (L) 07/30/2016      Chemistry      Component Value Date/Time   NA 143 07/30/2016 1047   K 4.2 07/30/2016 1047   CL 106 05/19/2016 0823   CL 105 06/17/2013 1441   CO2 27 07/30/2016  1047   BUN 12.2 07/30/2016 1047   CREATININE 0.8 07/30/2016 1047      Component Value Date/Time   CALCIUM 9.4 07/30/2016 1047   ALKPHOS 104 07/30/2016 1047   AST 48 (H) 07/30/2016 1047   ALT 47 07/30/2016 1047   BILITOT 1.07 07/30/2016 1047       RADIOGRAPHIC STUDIES: No results found.  ASSESSMENT AND PLAN:  This is a very pleasant 65 years old [place] American female with history of multiple myeloma currently on treatment with Revlimid and low-dose Decadron and tolerated her treatment fairly well. The patient is doing fine today with no specific complaints except for mild fatigue.  Because of disease progression, I recommended for her to continue her treatment with Revlimid and Decadron but I increased the dose of Revlimid to 25 mg by mouth daily for 21 days every 4 weeks, status post 12 months of this regimen. The recent myeloma panel performed at [facility] showed evidence for disease progression. The patient also has enlarging right-sided chest wall lesion. She underwent palliative radiotherapy to the enlarging right-posterior seventh rib lesion. She was treated with 1 cycle of chemotherapy with Daratumumab, Velcade and Decadron but this was discontinued secondary to intolerance and pancytopenia. She is currently undergoing treatment with weekly subcutaneous Velcade in addition to weekly Decadron. Status post 5 cycles. I recommended for the patient to proceed with cycle #6 today as a scheduled. I recommended for the patient to come back for follow-up visit in 4 weeks with repeating myeloma panel for reevaluation of her disease. The patient will continue her treatment with Zometa every 3 months as a scheduled. She was advised to call immediately if she has any concerning symptoms and interval. The patient voices understanding of current disease status and treatment options and is in agreement with the current care plan.  All questions were answered. The patient knows to call the  clinic with any problems, questions or concerns. We can certainly see the patient much sooner if necessary.  Disclaimer: This note was dictated with voice recognition software. Similar sounding words can inadvertently be transcribed and may not be corrected upon review.

## 2016-07-30 NOTE — Progress Notes (Signed)
Reviewed pt labs (CBC) with Dr. [name]. Pt is ok to treat with platelets of 73 per Dr. [name].

## 2016-07-30 NOTE — Progress Notes (Signed)
[place] [phone]   [phone]  OFFICE PROGRESS NOTE  Darlene Nakayama, MD [address], [address] [address]  DIAGNOSIS: Stage IIA IgA kappa multiple myeloma diagnosed in 2006   PRIOR THERAPY:  1) status post treatment with thalidomide and Decadron followed by autologous peripheral blood stem cell transplant with high-dose melphalan on 04/26/2006 at [facility].  2) the patient had evidence for disease recurrence in August of 2008 and she was treated with 6 cycles of Velcade completed on 03/30/2008 with response to the treatment but was discontinued secondary to neuropathy.  3) maintenance treatment with Revlimid 10 mg by mouth daily by the does was later increase to 15 mg by mouth daily secondary to biochemical recurrence with stabilization of her disease. The treatment with Revlimid 15 mg by mouth daily was discontinued secondary to evidence for disease progression. 4) Revlimid 25 mg by mouth daily for 3 weeks every 4 weeks in addition to Decadron currently at 20 mg by mouth on a weekly basis. She is status post 12 cycles. This was discontinued secondary to disease progression. 5) systemic chemotherapy with Daratumumab 16 MG/KG weekly for the first 9 weeks, Velcade 1.3 MG/M2 subcutaneously on days 1, 4, 8 and 11 every 3 weeks in addition to Decadron 20 mg by mouth on days 1, 2, 4, 5, 8, 9, 11 and 12 every 3 weeks. First dose 05/07/2016. Status post one cycle discontinued secondary to intolerance.   CURRENT THERAPY:  1) systemic chemotherapy with Velcade 1.3 MG/M2 subcutaneously weekly in addition to Decadron 40 mg by mouth daily. 2) Zometa 4 mg IV every 3 months. First dose in 05/05/2015  ADVANCED DIRECTIVE: She does not have advanced directive and was given information.  INTERVAL HISTORY: Darlene Howard 65 y.o. female returns to the clinic today for routine monthly followup visit accompanied by several family members. The  patient is doing fine today with no specific complaints. She is tolerating her treatment with weekly subcutaneous Velcade and Decadron fairly well. She status post 2 cycles. The patient denied having any fever or chills. She denied having any shortness of breath, cough or hemoptysis. She has no weight loss or night sweats. She is here today for evaluation before starting cycle #3.  MEDICAL HISTORY: Past Medical History:  Diagnosis Date  . Bruises easily    d/t being on COumadin  . Cholelithiasis   . DDD (degenerative disc disease), lumbar   . Fatty liver   . GERD (gastroesophageal reflux disease)    takes Pantoprazole daily  . Glaucoma    uses eye drops as instructed  . H/O stem cell transplant ([place])    04-26-2006  AT [facility]  . Herpes    takes Valtrex as needed  . History of colon polyps   . Hypertension    takes Amlodipine and Diovan daily  . Lung cancer ([place])    chest wall lesion aadn 6th rib  . Multiple myeloma DX  2006  STATE IIA,  IgA  KAPPA   STABLE  PER DR Darlene Howard--  S/P STEM CELL TRANSPLANT 2007 /  RECURRENCE 2008  CHEMO ENDED 03-30-2008/   NOW ON MAINTANANCE REVLIMID )  . 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 Personal history of colon cancer    ASCENDING COLON--  S/P  RIGHT HEMICOLECTOMY WITH NEGATIVE NODES/    NO RECURRENCE  . Type 2 diabetes mellitus ([place])   .  Vocal cord nodule    resolved- see Dr. Deeann Howard notes in media tab 06/17/14    ALLERGIES:  is allergic to codeine and tramadol.  MEDICATIONS:  Current Outpatient Prescriptions  Medication Sig Dispense Refill  . acetaminophen (TYLENOL) 500 MG tablet Take 500 mg by mouth daily as needed for moderate pain. Reported on 06/25/2016    . aspirin (ASPIRIN LOW DOSE) 81 MG EC tablet Take 81 mg by mouth daily.      . calcium-vitamin D (OSCAL 500/200 D-3) 500-200 MG-UNIT per tablet Take 1 tablet by mouth 3 (three) times daily.      . cholecalciferol (VITAMIN D) 1000  UNITS tablet Take 1,000 Units by mouth daily. Reported on 04/24/2016    . dexamethasone (DECADRON) 4 MG tablet Take 10 tabs (40 mg) once weekly while undergoing Velcade injections. 40 tablet 2  . dorzolamide-timolol (COSOPT) 22.3-6.8 MG/ML ophthalmic solution Place 1 drop into the left eye 2 (two) times daily.     Marland Kitchen gabapentin (NEURONTIN) 100 MG capsule Take 1 capsule by mouth 3  times daily 270 capsule 1  . hyaluronate sodium (RADIAPLEXRX) GEL Apply 1 application topically 2 (two) times daily. Reported on 06/25/2016    . loperamide (IMODIUM A-D) 2 MG tablet Take 2 mg by mouth daily as needed for diarrhea or loose stools. Reported on 07/09/2016    . loratadine (CLARITIN) 10 MG tablet TAKE ONE TABLET BY MOUTH ONCE DAILY. (Patient taking differently: TAKE 10 MG BY MOUTH ONCE DAILY.) 30 tablet 3  . LUMIGAN 0.01 % SOLN Place 1 drop into both eyes at bedtime.     . Multiple Vitamins-Minerals (CENTRUM SILVER PO) Take 1 tablet by mouth daily.     . ondansetron (ZOFRAN ODT) 4 MG disintegrating tablet Take 1 tablet (4 mg total) by mouth every 8 (eight) hours as needed for nausea or vomiting. (Patient not taking: Reported on 06/25/2016) 20 tablet 0  . ondansetron (ZOFRAN) 4 MG tablet Take 1 tablet (4 mg total) by mouth every 6 (six) hours. (Patient not taking: Reported on 06/25/2016) 10 tablet 0  . ONE TOUCH ULTRA TEST test strip USE AS DIRECTED TO CHECK DAILY. (Patient not taking: Reported on 07/09/2016) 50 each 3  . oxyCODONE (ROXICODONE) 5 MG immediate release tablet Take 1 tablet (5 mg total) by mouth every 4 (four) hours as needed for severe pain. (Patient not taking: Reported on 06/25/2016) 30 tablet 0  . oxyCODONE-acetaminophen (PERCOCET/ROXICET) 5-325 MG tablet Take 1 tablet by mouth every 8 (eight) hours as needed for severe pain. (Patient not taking: Reported on 06/25/2016) 40 tablet 0  . pantoprazole (PROTONIX) 40 MG tablet TAKE 1 TABLET BY MOUTH  DAILY FOR ACID REFLUX 90 tablet 1  . potassium chloride SA  (K-DUR,KLOR-CON) 20 MEQ tablet Take 1 tablet by mouth  twice a day (Patient taking differently: Take 20 meq by mouth twice daily) 180 tablet 0  . PREMARIN vaginal cream PLACE 1 APPLICATORFUL VAGINALLY TWICE A WEEK. 30 g 0  . temazepam (RESTORIL) 15 MG capsule Take 1 capsule (15 mg total) by mouth at bedtime as needed for sleep. 30 capsule 1  . valACYclovir (VALTREX) 1000 MG tablet Take 1 tablet by mouth  daily as needed 90 tablet 0  . valsartan-hydrochlorothiazide (DIOVAN-HCT) 320-25 MG tablet Take 1 tablet by mouth  daily 90 tablet 1   No current facility-administered medications for this visit.     SURGICAL HISTORY:  Past Surgical History:  Procedure Laterality Date  . [place]  w/  appendectomy and left salpingoophorectom  . CATARACT EXTRACTION W/PHACO Left 01/20/2014   Procedure: CATARACT EXTRACTION PHACO AND INTRAOCULAR LENS PLACEMENT (IOC);  Surgeon: Adonis Brook, MD;  Location: [facility];  Service: Ophthalmology;  Laterality: Left;  . CERVICAL CONE BIOPSY    . CHOLECYSTECTOMY N/A 02/28/2016   Procedure: LAPAROSCOPIC CHOLECYSTECTOMY;  Surgeon: Ralene Ok, MD;  Location: [facility];  Service: General;  Laterality: N/A;  . CO2  ABLATION VULVA PERIANAL AND VAGINAL DYSPLATIC AREAS  02-15-2010  . COLONOSCOPY  10/07/2006   YBW:LSLHTD rectum/Status post right hemicolectomy. Normal residual colon  . COLONOSCOPY  10/2009   RMR: diminutive RS polyp (hyperplastic).   . COLONOSCOPY N/A 12/15/2014   Procedure: COLONOSCOPY;  Surgeon: Daneil Dolin, MD;  [facility];  Service: Endoscopy;  Laterality: N/A;  130   . ESOPHAGOGASTRODUODENOSCOPY  04/02/2005   SKA:[id number] polyp with central erosion versus ulceration in the body of the stomach, resected and recovered Remainder of the gastric mucosa, D1, D2 appeared normal  . HEMICOLECTOMY Right 04-27-2005   CARCINOMA   ASCENDING COLON  . LESION REMOVAL N/A 05/06/2014   Procedure: LASER OF THE VAGINA;  Surgeon:  Janie Morning, MD;  [facility];  Service: Gynecology;  Laterality: N/A;  . LIMBAL STEM CELL TRANSPLANT  04-26-2006    ([facility])  . VULVAR LESION REMOVAL N/A 05/06/2014   Procedure: LASER OF VULVAR LESION;  Surgeon: Janie Morning, MD;  Location: [facility];  Service: Gynecology;  Laterality: N/A;  . VULVECTOMY N/A 05/06/2014   Procedure: WIDE LOCAL EXCISION LEFT LABIA MAJORA;  Surgeon: Janie Morning, MD;  Location: [place];  Service: Gynecology;  Laterality: N/A;    REVIEW OF SYSTEMS:  A comprehensive review of systems was negative except for: Constitutional: positive for fatigue   PHYSICAL EXAMINATION: General appearance: alert, cooperative and no distress Head: Normocephalic, without obvious abnormality, atraumatic Neck: no adenopathy Lymph nodes: Cervical, supraclavicular, and axillary nodes normal. Resp: clear to auscultation bilaterally Back: symmetric, no curvature. ROM normal. No CVA tenderness. Cardio: regular rate and rhythm, S1, S2 normal, no murmur, click, rub or gallop GI: soft, non-tender; bowel sounds normal; no masses,  no organomegaly Extremities: extremities normal, atraumatic, no cyanosis or edema Neurologic: Alert and oriented X 3, normal strength and tone. Normal symmetric reflexes. Normal coordination and gait  ECOG PERFORMANCE STATUS: 1 - Symptomatic but completely ambulatory  Blood pressure (!) 146/83, pulse (!) 102, temperature 98.1 F (36.7 C), temperature source Oral, resp. rate 17, height _0  (1.676 m), weight 183 lb 6.4 oz (83.2 kg), SpO2 97 %.  LABORATORY DATA: Lab Results  Component Value Date   WBC 11.3 (H) 07/30/2016   HGB 8.8 (L) 07/30/2016   HCT 28.3 (L) 07/30/2016   MCV 99.3 07/30/2016   PLT 73 (L) 07/30/2016      Chemistry      Component Value Date/Time   NA 143 07/30/2016 1047   K 4.2 07/30/2016 1047   CL 106 05/19/2016 0823   CL 105 06/17/2013 1441   CO2 27 07/30/2016 1047   BUN  12.2 07/30/2016 1047   CREATININE 0.8 07/30/2016 1047      Component Value Date/Time   CALCIUM 9.4 07/30/2016 1047   ALKPHOS 104 07/30/2016 1047   AST 48 (H) 07/30/2016 1047   ALT 47 07/30/2016 1047   BILITOT 1.07 07/30/2016 1047       RADIOGRAPHIC STUDIES: No results found.  ASSESSMENT AND PLAN: This is a very pleasant  66 years old [place] American female with history of multiple myeloma currently on treatment with Revlimid and low-dose Decadron and tolerated her treatment fairly well. The patient is doing fine today with no specific complaints except for mild fatigue.  Because of disease progression, I recommended for her to continue her treatment with Revlimid and Decadron but I increased the dose of Revlimid to 25 mg by mouth daily for 21 days every 4 weeks, status post 12 months of this regimen. The recent myeloma panel performed at [facility] showed evidence for disease progression. The patient also has enlarging right-sided chest wall lesion. She underwent palliative radiotherapy to the enlarging right-posterior seventh rib lesion. She was treated with 1 cycle of chemotherapy with Daratumumab, Velcade and Decadron but this was discontinued secondary to intolerance and pancytopenia. She is currently undergoing treatment with weekly subcutaneous Velcade in addition to weekly Decadron. Status post 2 cycles. I recommended for the patient to proceed with cycle #3 today as a scheduled. I recommended for the patient to come back for follow-up visit in 3 weeks for reevaluation and management of any adverse effect of her treatment.  Because mild elevation of her liver enzyme and I will continue to monitor this closely on upcoming lab work. The patient will continue her treatment with Zometa every 3 months as a scheduled. She was advised to call immediately if she has any concerning symptoms and interval. The patient voices understanding of current disease status and treatment options  and is in agreement with the current care plan.  All questions were answered. The patient knows to call the clinic with any problems, questions or concerns. We can certainly see the patient much sooner if necessary.  Disclaimer: This note was dictated with voice recognition software. Similar sounding words can inadvertently be transcribed and may not be corrected upon review.

## 2016-08-06 ENCOUNTER — Other Ambulatory Visit (HOSPITAL_BASED_OUTPATIENT_CLINIC_OR_DEPARTMENT_OTHER): Payer: Medicare Other

## 2016-08-06 ENCOUNTER — Ambulatory Visit (HOSPITAL_BASED_OUTPATIENT_CLINIC_OR_DEPARTMENT_OTHER): Payer: Medicare Other

## 2016-08-06 VITALS — BP 139/82 | HR 96 | Temp 98.1°F | Resp 17

## 2016-08-06 DIAGNOSIS — Z5112 Encounter for antineoplastic immunotherapy: Secondary | ICD-10-CM | POA: Diagnosis present

## 2016-08-06 DIAGNOSIS — C9002 Multiple myeloma in relapse: Secondary | ICD-10-CM

## 2016-08-06 DIAGNOSIS — C9000 Multiple myeloma not having achieved remission: Secondary | ICD-10-CM

## 2016-08-06 LAB — COMPREHENSIVE METABOLIC PANEL
ALBUMIN: 3.1 g/dL — AB (ref 3.5–5.0)
ALT: 21 U/L (ref 0–55)
ANION GAP: 11 meq/L (ref 3–11)
AST: 15 U/L (ref 5–34)
Alkaline Phosphatase: 86 U/L (ref 40–150)
BUN: 11.2 mg/dL (ref 7.0–26.0)
CO2: 25 meq/L (ref 22–29)
CREATININE: 0.8 mg/dL (ref 0.6–1.1)
Calcium: 9.5 mg/dL (ref 8.4–10.4)
Chloride: 106 mEq/L (ref 98–109)
EGFR: 83 mL/min/{1.73_m2} — ABNORMAL LOW (ref >90)
Glucose: 119 mg/dl (ref 70–140)
POTASSIUM: 4.1 meq/L (ref 3.5–5.1)
Sodium: 142 mEq/L (ref 136–145)
TOTAL PROTEIN: 6.9 g/dL (ref 6.4–8.3)
Total Bilirubin: 0.77 mg/dL (ref 0.20–1.20)

## 2016-08-06 LAB — CBC WITH DIFFERENTIAL/PLATELET
BASO%: 0.2 % (ref 0.0–2.0)
BASOS ABS: 0 10*3/uL (ref 0.0–0.1)
EOS ABS: 0.1 10*3/uL (ref 0.0–0.5)
EOS%: 1 % (ref 0.0–7.0)
HEMATOCRIT: 28.7 % — AB (ref 34.8–46.6)
HEMOGLOBIN: 8.9 g/dL — AB (ref 11.6–15.9)
LYMPH#: 1.2 10*3/uL (ref 0.9–3.3)
LYMPH%: 10.1 % — ABNORMAL LOW (ref 14.0–49.7)
MCH: 30.6 pg (ref 25.1–34.0)
MCHC: 31 g/dL — ABNORMAL LOW (ref 31.5–36.0)
MCV: 98.6 fL (ref 79.5–101.0)
MONO#: 1 10*3/uL — ABNORMAL HIGH (ref 0.1–0.9)
MONO%: 8.4 % (ref 0.0–14.0)
NEUT#: 9.8 10*3/uL — ABNORMAL HIGH (ref 1.5–6.5)
NEUT%: 80.3 % — AB (ref 38.4–76.8)
NRBC: 1 % — AB (ref 0–0)
PLATELETS: 78 10*3/uL — AB (ref 145–400)
RBC: 2.91 10*6/uL — ABNORMAL LOW (ref 3.70–5.45)
RDW: 23.6 % — AB (ref 11.2–14.5)
WBC: 12.2 10*3/uL — ABNORMAL HIGH (ref 3.9–10.3)

## 2016-08-06 MED ORDER — BORTEZOMIB CHEMO SQ INJECTION 3.5 MG (2.5MG/ML)
1.3000 mg/m2 | Freq: Once | INTRAMUSCULAR | Status: AC
  Administered 2016-08-06: 2.5 mg via SUBCUTANEOUS
  Filled 2016-08-06: qty 2.5

## 2016-08-06 MED ORDER — PROCHLORPERAZINE MALEATE 10 MG PO TABS
10.0000 mg | ORAL_TABLET | Freq: Once | ORAL | Status: AC
  Administered 2016-08-06: 10 mg via ORAL

## 2016-08-06 MED ORDER — PROCHLORPERAZINE MALEATE 10 MG PO TABS
ORAL_TABLET | ORAL | Status: AC
  Filled 2016-08-06: qty 1

## 2016-08-06 NOTE — Progress Notes (Signed)
OK for treatment today with PLT of 78

## 2016-08-06 NOTE — Patient Instructions (Signed)
[facility] Discharge Instructions for Patients Receiving Chemotherapy  Today you received the following chemotherapy agents:  Velcade  To help prevent nausea and vomiting after your treatment, we encourage you to take your nausea medication as prescribed.   If you develop nausea and vomiting that is not controlled by your nausea medication, call the clinic.   BELOW ARE SYMPTOMS THAT SHOULD BE REPORTED IMMEDIATELY:  *FEVER GREATER THAN 100.5 F  *CHILLS WITH OR WITHOUT FEVER  NAUSEA AND VOMITING THAT IS NOT CONTROLLED WITH YOUR NAUSEA MEDICATION  *UNUSUAL SHORTNESS OF BREATH  *UNUSUAL BRUISING OR BLEEDING  TENDERNESS IN MOUTH AND THROAT WITH OR WITHOUT PRESENCE OF ULCERS  *URINARY PROBLEMS  *BOWEL PROBLEMS  UNUSUAL RASH Items with * indicate a potential emergency and should be followed up as soon as possible.  Feel free to call the clinic you have any questions or concerns. The clinic phone number is [phone].  Please show the CHEMO ALERT CARD at check-in to the Emergency Department and triage nurse.     Bortezomib injection What is this medicine? BORTEZOMIB (bor TEZ oh mib) is a medicine that targets proteins in cancer cells and stops the cancer cells from growing. It is used to treat multiple myeloma and mantle-cell lymphoma. This medicine may be used for other purposes; ask your health care provider or pharmacist if you have questions. What should I tell my health care provider before I take this medicine? They need to know if you have any of these conditions: -diabetes -heart disease -irregular heartbeat -liver disease -on hemodialysis -low blood counts, like low white blood cells, platelets, or hemoglobin -peripheral neuropathy -taking medicine for blood pressure -an unusual or allergic reaction to bortezomib, mannitol, boron, other medicines, foods, dyes, or preservatives -pregnant or trying to get pregnant -breast-feeding How should I  use this medicine? This medicine is for injection into a vein or for injection under the skin. It is given by a health care professional in a hospital or clinic setting. Talk to your pediatrician regarding the use of this medicine in children. Special care may be needed. Overdosage: If you think you have taken too much of this medicine contact a poison control center or emergency room at once. NOTE: This medicine is only for you. Do not share this medicine with others. What if I miss a dose? It is important not to miss your dose. Call your doctor or health care professional if you are unable to keep an appointment. What may interact with this medicine? This medicine may interact with the following medications: -ketoconazole -rifampin -ritonavir -St. John's Wort This list may not describe all possible interactions. Give your health care provider a list of all the medicines, herbs, non-prescription drugs, or dietary supplements you use. Also tell them if you smoke, drink alcohol, or use illegal drugs. Some items may interact with your medicine. What should I watch for while using this medicine? Visit your doctor for checks on your progress. This drug may make you feel generally unwell. This is not uncommon, as chemotherapy can affect healthy cells as well as cancer cells. Report any side effects. Continue your course of treatment even though you feel ill unless your doctor tells you to stop. You may get drowsy or dizzy. Do not drive, use machinery, or do anything that needs mental alertness until you know how this medicine affects you. Do not stand or sit up quickly, especially if you are an older patient. This reduces the risk of dizzy or   fainting spells. In some cases, you may be given additional medicines to help with side effects. Follow all directions for their use. Call your doctor or health care professional for advice if you get a fever, chills or sore throat, or other symptoms of a cold or  flu. Do not treat yourself. This drug decreases your body's ability to fight infections. Try to avoid being around people who are sick. This medicine may increase your risk to bruise or bleed. Call your doctor or health care professional if you notice any unusual bleeding. You may need blood work done while you are taking this medicine. In some patients, this medicine may cause a serious brain infection that may cause death. If you have any problems seeing, thinking, speaking, walking, or standing, tell your doctor right away. If you cannot reach your doctor, urgently seek other source of medical care. Do not become pregnant while taking this medicine. Women should inform their doctor if they wish to become pregnant or think they might be pregnant. There is a potential for serious side effects to an unborn child. Talk to your health care professional or pharmacist for more information. Do not breast-feed an infant while taking this medicine. Check with your doctor or health care professional if you get an attack of severe diarrhea, nausea and vomiting, or if you sweat a lot. The loss of too much body fluid can make it dangerous for you to take this medicine. What side effects may I notice from receiving this medicine? Side effects that you should report to your doctor or health care professional as soon as possible: -allergic reactions like skin rash, itching or hives, swelling of the face, lips, or tongue -breathing problems -changes in hearing -changes in vision -fast, irregular heartbeat -feeling faint or lightheaded, falls -pain, tingling, numbness in the hands or feet -right upper belly pain -seizures -swelling of the ankles, feet, hands -unusual bleeding or bruising -unusually weak or tired -vomiting -yellowing of the eyes or skin Side effects that usually do not require medical attention (report to your doctor or health care professional if they continue or are bothersome): -changes in  emotions or moods -constipation -diarrhea -loss of appetite -headache -irritation at site where injected -nausea This list may not describe all possible side effects. Call your doctor for medical advice about side effects. You may report side effects to FDA at 1-800-FDA-1088. Where should I keep my medicine? This drug is given in a hospital or clinic and will not be stored at home. NOTE: This sheet is a summary. It may not cover all possible information. If you have questions about this medicine, talk to your doctor, pharmacist, or health care provider.    2016, Elsevier/Gold Standard. (2015-02-15 14:47:04)  

## 2016-08-09 ENCOUNTER — Other Ambulatory Visit: Payer: Self-pay | Admitting: Family Medicine

## 2016-08-10 ENCOUNTER — Ambulatory Visit
Admission: RE | Admit: 2016-08-10 | Discharge: 2016-08-10 | Disposition: A | Discharge status: Home / Self Care | Payer: Medicare Other | Source: Ambulatory Visit | Attending: Family Medicine | Admitting: Family Medicine

## 2016-08-10 DIAGNOSIS — Z1231 Encounter for screening mammogram for malignant neoplasm of breast: Secondary | ICD-10-CM | POA: Diagnosis not present

## 2016-08-13 ENCOUNTER — Other Ambulatory Visit (HOSPITAL_BASED_OUTPATIENT_CLINIC_OR_DEPARTMENT_OTHER): Payer: Medicare Other

## 2016-08-13 ENCOUNTER — Ambulatory Visit (HOSPITAL_BASED_OUTPATIENT_CLINIC_OR_DEPARTMENT_OTHER): Payer: Medicare Other

## 2016-08-13 VITALS — BP 111/63 | HR 91 | Temp 98.1°F | Resp 20

## 2016-08-13 DIAGNOSIS — C9002 Multiple myeloma in relapse: Secondary | ICD-10-CM

## 2016-08-13 DIAGNOSIS — C9000 Multiple myeloma not having achieved remission: Secondary | ICD-10-CM

## 2016-08-13 DIAGNOSIS — Z5112 Encounter for antineoplastic immunotherapy: Secondary | ICD-10-CM | POA: Diagnosis present

## 2016-08-13 LAB — CBC WITH DIFFERENTIAL/PLATELET
BASO%: 0.2 % (ref 0.0–2.0)
BASOS ABS: 0 10*3/uL (ref 0.0–0.1)
EOS ABS: 0.1 10*3/uL (ref 0.0–0.5)
EOS%: 0.6 % (ref 0.0–7.0)
HCT: 29.5 % — ABNORMAL LOW (ref 34.8–46.6)
HGB: 9.4 g/dL — ABNORMAL LOW (ref 11.6–15.9)
LYMPH%: 6.1 % — AB (ref 14.0–49.7)
MCH: 31.8 pg (ref 25.1–34.0)
MCHC: 31.8 g/dL (ref 31.5–36.0)
MCV: 100.1 fL (ref 79.5–101.0)
MONO#: 1.2 10*3/uL — AB (ref 0.1–0.9)
MONO%: 11.2 % (ref 0.0–14.0)
NEUT%: 81.9 % — AB (ref 38.4–76.8)
NEUTROS ABS: 8.8 10*3/uL — AB (ref 1.5–6.5)
PLATELETS: 87 10*3/uL — AB (ref 145–400)
RBC: 2.95 10*6/uL — AB (ref 3.70–5.45)
RDW: 24.9 % — ABNORMAL HIGH (ref 11.2–14.5)
WBC: 10.8 10*3/uL — ABNORMAL HIGH (ref 3.9–10.3)
lymph#: 0.7 10*3/uL — ABNORMAL LOW (ref 0.9–3.3)

## 2016-08-13 LAB — COMPREHENSIVE METABOLIC PANEL
ALK PHOS: 81 U/L (ref 40–150)
ALT: 28 U/L (ref 0–55)
ANION GAP: 9 meq/L (ref 3–11)
AST: 22 U/L (ref 5–34)
Albumin: 3.1 g/dL — ABNORMAL LOW (ref 3.5–5.0)
BILIRUBIN TOTAL: 0.62 mg/dL (ref 0.20–1.20)
BUN: 15.2 mg/dL (ref 7.0–26.0)
CO2: 23 meq/L (ref 22–29)
Calcium: 8.8 mg/dL (ref 8.4–10.4)
Chloride: 108 mEq/L (ref 98–109)
Creatinine: 0.9 mg/dL (ref 0.6–1.1)
EGFR: 75 mL/min/{1.73_m2} — AB (ref >90)
Glucose: 114 mg/dl (ref 70–140)
Potassium: 3.9 mEq/L (ref 3.5–5.1)
Sodium: 140 mEq/L (ref 136–145)
TOTAL PROTEIN: 6.7 g/dL (ref 6.4–8.3)

## 2016-08-13 MED ORDER — PROCHLORPERAZINE MALEATE 10 MG PO TABS
10.0000 mg | ORAL_TABLET | Freq: Once | ORAL | Status: AC
  Administered 2016-08-13: 10 mg via ORAL

## 2016-08-13 MED ORDER — PROCHLORPERAZINE MALEATE 10 MG PO TABS
ORAL_TABLET | ORAL | Status: AC
  Filled 2016-08-13: qty 1

## 2016-08-13 MED ORDER — BORTEZOMIB CHEMO SQ INJECTION 3.5 MG (2.5MG/ML)
1.3000 mg/m2 | Freq: Once | INTRAMUSCULAR | Status: AC
  Administered 2016-08-13: 2.5 mg via SUBCUTANEOUS
  Filled 2016-08-13: qty 2.5

## 2016-08-13 NOTE — Progress Notes (Signed)
OK to treat with platelet count of 87 per Dr. [name].

## 2016-08-13 NOTE — Patient Instructions (Signed)
[facility] Discharge Instructions for Patients Receiving Chemotherapy  Today you received the following chemotherapy agents:  Velcade  To help prevent nausea and vomiting after your treatment, we encourage you to take your nausea medication as ordered per MD.   If you develop nausea and vomiting that is not controlled by your nausea medication, call the clinic.   BELOW ARE SYMPTOMS THAT SHOULD BE REPORTED IMMEDIATELY:  *FEVER GREATER THAN 100.5 F  *CHILLS WITH OR WITHOUT FEVER  NAUSEA AND VOMITING THAT IS NOT CONTROLLED WITH YOUR NAUSEA MEDICATION  *UNUSUAL SHORTNESS OF BREATH  *UNUSUAL BRUISING OR BLEEDING  TENDERNESS IN MOUTH AND THROAT WITH OR WITHOUT PRESENCE OF ULCERS  *URINARY PROBLEMS  *BOWEL PROBLEMS  UNUSUAL RASH Items with * indicate a potential emergency and should be followed up as soon as possible.  Feel free to call the clinic you have any questions or concerns. The clinic phone number is [phone].  Please show the [place] at check-in to the Emergency Department and triage nurse.

## 2016-08-14 ENCOUNTER — Other Ambulatory Visit: Payer: Self-pay | Admitting: Family Medicine

## 2016-08-17 NOTE — Addendum Note (Signed)
Encounter addended by: Benn Moulder, RN on: 08/17/2016  8:31 PM<BR>    Actions taken: Charge Capture section accepted

## 2016-08-20 ENCOUNTER — Ambulatory Visit (HOSPITAL_BASED_OUTPATIENT_CLINIC_OR_DEPARTMENT_OTHER): Payer: Medicare Other

## 2016-08-20 ENCOUNTER — Other Ambulatory Visit (HOSPITAL_BASED_OUTPATIENT_CLINIC_OR_DEPARTMENT_OTHER): Payer: Medicare Other

## 2016-08-20 VITALS — BP 139/70 | HR 88 | Temp 98.1°F | Resp 18

## 2016-08-20 DIAGNOSIS — Z5112 Encounter for antineoplastic immunotherapy: Secondary | ICD-10-CM

## 2016-08-20 DIAGNOSIS — C9002 Multiple myeloma in relapse: Secondary | ICD-10-CM | POA: Diagnosis present

## 2016-08-20 DIAGNOSIS — D696 Thrombocytopenia, unspecified: Secondary | ICD-10-CM

## 2016-08-20 DIAGNOSIS — C9000 Multiple myeloma not having achieved remission: Secondary | ICD-10-CM | POA: Diagnosis not present

## 2016-08-20 LAB — COMPREHENSIVE METABOLIC PANEL
ALT: 54 U/L (ref 0–55)
ANION GAP: 7 meq/L (ref 3–11)
AST: 72 U/L — ABNORMAL HIGH (ref 5–34)
Albumin: 3 g/dL — ABNORMAL LOW (ref 3.5–5.0)
Alkaline Phosphatase: 86 U/L (ref 40–150)
BUN: 19.1 mg/dL (ref 7.0–26.0)
CALCIUM: 9.3 mg/dL (ref 8.4–10.4)
CHLORIDE: 108 meq/L (ref 98–109)
CO2: 27 meq/L (ref 22–29)
Creatinine: 1.1 mg/dL (ref 0.6–1.1)
EGFR: 60 mL/min/{1.73_m2} — AB (ref >90)
Glucose: 98 mg/dl (ref 70–140)
POTASSIUM: 3.9 meq/L (ref 3.5–5.1)
Sodium: 142 mEq/L (ref 136–145)
Total Bilirubin: 0.89 mg/dL (ref 0.20–1.20)
Total Protein: 6.4 g/dL (ref 6.4–8.3)

## 2016-08-20 LAB — CBC WITH DIFFERENTIAL/PLATELET
BASO%: 0.3 % (ref 0.0–2.0)
BASOS ABS: 0 10*3/uL (ref 0.0–0.1)
EOS%: 0.4 % (ref 0.0–7.0)
Eosinophils Absolute: 0 10*3/uL (ref 0.0–0.5)
HCT: 29 % — ABNORMAL LOW (ref 34.8–46.6)
HGB: 9 g/dL — ABNORMAL LOW (ref 11.6–15.9)
LYMPH%: 4.6 % — AB (ref 14.0–49.7)
MCH: 31.6 pg (ref 25.1–34.0)
MCHC: 31.1 g/dL — AB (ref 31.5–36.0)
MCV: 101.7 fL — AB (ref 79.5–101.0)
MONO#: 1.3 10*3/uL — AB (ref 0.1–0.9)
MONO%: 10.8 % (ref 0.0–14.0)
NEUT#: 10.1 10*3/uL — ABNORMAL HIGH (ref 1.5–6.5)
NEUT%: 83.9 % — AB (ref 38.4–76.8)
PLATELETS: 108 10*3/uL — AB (ref 145–400)
RBC: 2.85 10*6/uL — AB (ref 3.70–5.45)
RDW: 24.6 % — ABNORMAL HIGH (ref 11.2–14.5)
WBC: 12 10*3/uL — ABNORMAL HIGH (ref 3.9–10.3)
lymph#: 0.6 10*3/uL — ABNORMAL LOW (ref 0.9–3.3)

## 2016-08-20 LAB — LACTATE DEHYDROGENASE: LDH: 231 U/L (ref 125–245)

## 2016-08-20 MED ORDER — PROCHLORPERAZINE MALEATE 10 MG PO TABS
ORAL_TABLET | ORAL | Status: AC
  Filled 2016-08-20: qty 1

## 2016-08-20 MED ORDER — BORTEZOMIB CHEMO SQ INJECTION 3.5 MG (2.5MG/ML)
1.3000 mg/m2 | Freq: Once | INTRAMUSCULAR | Status: AC
  Administered 2016-08-20: 2.5 mg via SUBCUTANEOUS
  Filled 2016-08-20: qty 2.5

## 2016-08-20 MED ORDER — ACETAMINOPHEN 500 MG PO TABS
ORAL_TABLET | ORAL | Status: AC
  Filled 2016-08-20: qty 1

## 2016-08-20 MED ORDER — PROCHLORPERAZINE MALEATE 10 MG PO TABS
10.0000 mg | ORAL_TABLET | Freq: Once | ORAL | Status: AC
Start: 2016-08-20 — End: 2016-08-20
  Administered 2016-08-20: 10 mg via ORAL

## 2016-08-20 MED ORDER — ACETAMINOPHEN 500 MG PO TABS
500.0000 mg | ORAL_TABLET | Freq: Once | ORAL | Status: AC
  Administered 2016-08-20: 500 mg via ORAL

## 2016-08-20 NOTE — Patient Instructions (Signed)
[facility] Discharge Instructions for Patients Receiving Chemotherapy  Today you received the following chemotherapy agents Velcade. To help prevent nausea and vomiting after your treatment, we encourage you to take your nausea medication as directed.  If you develop nausea and vomiting that is not controlled by your nausea medication, call the clinic.   BELOW ARE SYMPTOMS THAT SHOULD BE REPORTED IMMEDIATELY:  *FEVER GREATER THAN 100.5 F  *CHILLS WITH OR WITHOUT FEVER  NAUSEA AND VOMITING THAT IS NOT CONTROLLED WITH YOUR NAUSEA MEDICATION  *UNUSUAL SHORTNESS OF BREATH  *UNUSUAL BRUISING OR BLEEDING  TENDERNESS IN MOUTH AND THROAT WITH OR WITHOUT PRESENCE OF ULCERS  *URINARY PROBLEMS  *BOWEL PROBLEMS  UNUSUAL RASH Items with * indicate a potential emergency and should be followed up as soon as possible.  Feel free to call the clinic you have any questions or concerns. The clinic phone number is [phone].  Please show the CHEMO ALERT CARD at check-in to the Emergency Department and triage nurse.    

## 2016-08-21 LAB — IGG, IGA, IGM
IGG (IMMUNOGLOBIN G), SERUM: 212 mg/dL — AB (ref 700–1600)
IgM, Qn, Serum: 25 mg/dL — ABNORMAL LOW (ref 26–217)

## 2016-08-21 LAB — KAPPA/LAMBDA LIGHT CHAINS
IG KAPPA FREE LIGHT CHAIN: 6.9 mg/L (ref 3.3–19.4)
Ig Lambda Free Light Chain: 3.6 mg/L — ABNORMAL LOW (ref 5.7–26.3)
Kappa/Lambda FluidC Ratio: 1.92 — ABNORMAL HIGH (ref 0.26–1.65)

## 2016-08-21 LAB — BETA 2 MICROGLOBULIN, SERUM: BETA 2: 2 mg/L (ref 0.6–2.4)

## 2016-08-23 ENCOUNTER — Other Ambulatory Visit: Payer: Self-pay | Admitting: Family Medicine

## 2016-08-23 DIAGNOSIS — G4700 Insomnia, unspecified: Secondary | ICD-10-CM

## 2016-08-23 DIAGNOSIS — E669 Obesity, unspecified: Secondary | ICD-10-CM | POA: Diagnosis not present

## 2016-08-23 DIAGNOSIS — E119 Type 2 diabetes mellitus without complications: Secondary | ICD-10-CM | POA: Diagnosis not present

## 2016-08-24 LAB — COMPLETE METABOLIC PANEL WITH GFR
ALT: 29 U/L (ref 6–29)
AST: 14 U/L (ref 10–35)
Albumin: 3.6 g/dL (ref 3.6–5.1)
Alkaline Phosphatase: 71 U/L (ref 33–130)
BILIRUBIN TOTAL: 1 mg/dL (ref 0.2–1.2)
BUN: 21 mg/dL (ref 7–25)
CO2: 27 mmol/L (ref 20–31)
Calcium: 8.7 mg/dL (ref 8.6–10.4)
Chloride: 110 mmol/L (ref 98–110)
Creat: 0.87 mg/dL (ref 0.50–0.99)
GFR, EST NON AFRICAN AMERICAN: 69 mL/min (ref >=60)
GFR, Est African American: 80 mL/min (ref >=60)
GLUCOSE: 108 mg/dL — AB (ref 65–99)
Potassium: 4 mmol/L (ref 3.5–5.3)
SODIUM: 146 mmol/L (ref 135–146)
TOTAL PROTEIN: 6.1 g/dL (ref 6.1–8.1)

## 2016-08-24 LAB — HEMOGLOBIN A1C
Hgb A1c MFr Bld: 5.2 % (ref <5.7)
MEAN PLASMA GLUCOSE: 103 mg/dL

## 2016-08-24 LAB — MICROALBUMIN / CREATININE URINE RATIO
Creatinine, Urine: 101 mg/dL (ref 20–320)
MICROALB/CREAT RATIO: 26 ug/mg{creat} (ref <30)
Microalb, Ur: 2.6 mg/dL

## 2016-08-27 ENCOUNTER — Telehealth: Payer: Self-pay | Admitting: Internal Medicine

## 2016-08-27 ENCOUNTER — Encounter: Payer: Self-pay | Admitting: Internal Medicine

## 2016-08-27 ENCOUNTER — Other Ambulatory Visit (HOSPITAL_BASED_OUTPATIENT_CLINIC_OR_DEPARTMENT_OTHER): Payer: Medicare Other

## 2016-08-27 ENCOUNTER — Ambulatory Visit (HOSPITAL_BASED_OUTPATIENT_CLINIC_OR_DEPARTMENT_OTHER): Payer: Medicare Other

## 2016-08-27 ENCOUNTER — Ambulatory Visit (HOSPITAL_BASED_OUTPATIENT_CLINIC_OR_DEPARTMENT_OTHER): Payer: Medicare Other | Admitting: Internal Medicine

## 2016-08-27 VITALS — BP 158/71 | HR 88 | Temp 97.8°F | Resp 18 | Wt 183.6 lb

## 2016-08-27 DIAGNOSIS — Z5112 Encounter for antineoplastic immunotherapy: Secondary | ICD-10-CM | POA: Diagnosis present

## 2016-08-27 DIAGNOSIS — Z9484 Stem cells transplant status: Secondary | ICD-10-CM

## 2016-08-27 DIAGNOSIS — C9000 Multiple myeloma not having achieved remission: Secondary | ICD-10-CM

## 2016-08-27 DIAGNOSIS — C9002 Multiple myeloma in relapse: Secondary | ICD-10-CM

## 2016-08-27 DIAGNOSIS — R5383 Other fatigue: Secondary | ICD-10-CM

## 2016-08-27 DIAGNOSIS — Z5111 Encounter for antineoplastic chemotherapy: Secondary | ICD-10-CM

## 2016-08-27 LAB — CBC WITH DIFFERENTIAL/PLATELET
BASO%: 0.1 % (ref 0.0–2.0)
BASOS ABS: 0 10*3/uL (ref 0.0–0.1)
EOS ABS: 0.1 10*3/uL (ref 0.0–0.5)
EOS%: 0.4 % (ref 0.0–7.0)
HCT: 29.3 % — ABNORMAL LOW (ref 34.8–46.6)
HGB: 9.2 g/dL — ABNORMAL LOW (ref 11.6–15.9)
LYMPH%: 7.7 % — AB (ref 14.0–49.7)
MCH: 31.7 pg (ref 25.1–34.0)
MCHC: 31.4 g/dL — AB (ref 31.5–36.0)
MCV: 101 fL (ref 79.5–101.0)
MONO#: 1 10*3/uL — ABNORMAL HIGH (ref 0.1–0.9)
MONO%: 8.7 % (ref 0.0–14.0)
NEUT#: 9.9 10*3/uL — ABNORMAL HIGH (ref 1.5–6.5)
NEUT%: 83.1 % — AB (ref 38.4–76.8)
NRBC: 1 % — AB (ref 0–0)
PLATELETS: 116 10*3/uL — AB (ref 145–400)
RBC: 2.9 10*6/uL — AB (ref 3.70–5.45)
RDW: 20.8 % — ABNORMAL HIGH (ref 11.2–14.5)
WBC: 11.9 10*3/uL — ABNORMAL HIGH (ref 3.9–10.3)
lymph#: 0.9 10*3/uL (ref 0.9–3.3)

## 2016-08-27 LAB — COMPREHENSIVE METABOLIC PANEL
ALT: 27 U/L (ref 0–55)
ANION GAP: 11 meq/L (ref 3–11)
AST: 24 U/L (ref 5–34)
Albumin: 3 g/dL — ABNORMAL LOW (ref 3.5–5.0)
Alkaline Phosphatase: 75 U/L (ref 40–150)
BILIRUBIN TOTAL: 0.72 mg/dL (ref 0.20–1.20)
BUN: 13.9 mg/dL (ref 7.0–26.0)
CO2: 24 meq/L (ref 22–29)
CREATININE: 0.9 mg/dL (ref 0.6–1.1)
Calcium: 9.3 mg/dL (ref 8.4–10.4)
Chloride: 109 mEq/L (ref 98–109)
EGFR: 81 mL/min/{1.73_m2} — AB (ref >90)
Glucose: 111 mg/dl (ref 70–140)
Potassium: 4 mEq/L (ref 3.5–5.1)
Sodium: 143 mEq/L (ref 136–145)
TOTAL PROTEIN: 6.3 g/dL — AB (ref 6.4–8.3)

## 2016-08-27 LAB — TECHNOLOGIST REVIEW

## 2016-08-27 MED ORDER — PROCHLORPERAZINE MALEATE 10 MG PO TABS
10.0000 mg | ORAL_TABLET | Freq: Once | ORAL | Status: AC
  Administered 2016-08-27: 10 mg via ORAL

## 2016-08-27 MED ORDER — PROCHLORPERAZINE MALEATE 10 MG PO TABS
ORAL_TABLET | ORAL | Status: AC
  Filled 2016-08-27: qty 1

## 2016-08-27 MED ORDER — BORTEZOMIB CHEMO SQ INJECTION 3.5 MG (2.5MG/ML)
1.3000 mg/m2 | Freq: Once | INTRAMUSCULAR | Status: AC
  Administered 2016-08-27: 2.5 mg via SUBCUTANEOUS
  Filled 2016-08-27: qty 2.5

## 2016-08-27 NOTE — Patient Instructions (Signed)
[facility] Discharge Instructions for Patients Receiving Chemotherapy  Today you received the following chemotherapy agents Velcade. To help prevent nausea and vomiting after your treatment, we encourage you to take your nausea medication as directed.  If you develop nausea and vomiting that is not controlled by your nausea medication, call the clinic.   BELOW ARE SYMPTOMS THAT SHOULD BE REPORTED IMMEDIATELY:  *FEVER GREATER THAN 100.5 F  *CHILLS WITH OR WITHOUT FEVER  NAUSEA AND VOMITING THAT IS NOT CONTROLLED WITH YOUR NAUSEA MEDICATION  *UNUSUAL SHORTNESS OF BREATH  *UNUSUAL BRUISING OR BLEEDING  TENDERNESS IN MOUTH AND THROAT WITH OR WITHOUT PRESENCE OF ULCERS  *URINARY PROBLEMS  *BOWEL PROBLEMS  UNUSUAL RASH Items with * indicate a potential emergency and should be followed up as soon as possible.  Feel free to call the clinic you have any questions or concerns. The clinic phone number is [phone].  Please show the CHEMO ALERT CARD at check-in to the Emergency Department and triage nurse.    

## 2016-08-27 NOTE — Telephone Encounter (Signed)
Gave patient avs report and appointments for September and October  °

## 2016-08-27 NOTE — Progress Notes (Signed)
[place] [phone]   [phone]  OFFICE PROGRESS NOTE  [name], MD [address], [address] [place] [address]  DIAGNOSIS: Stage IIA IgA kappa multiple myeloma diagnosed in 2006   PRIOR THERAPY:  1) status post treatment with thalidomide and Decadron followed by autologous peripheral blood stem cell transplant with high-dose melphalan on 04/26/2006 at [facility].  2) the patient had evidence for disease recurrence in August of 2008 and she was treated with 6 cycles of Velcade completed on 03/30/2008 with response to the treatment but was discontinued secondary to neuropathy.  3) maintenance treatment with Revlimid 10 mg by mouth daily by the does was later increase to 15 mg by mouth daily secondary to biochemical recurrence with stabilization of her disease. The treatment with Revlimid 15 mg by mouth daily was discontinued secondary to evidence for disease progression. 4) Revlimid 25 mg by mouth daily for 3 weeks every 4 weeks in addition to Decadron currently at 20 mg by mouth on a weekly basis. She is status post 12 cycles. This was discontinued secondary to disease progression. 5) systemic chemotherapy with Daratumumab 16 MG/KG weekly for the first 9 weeks, Velcade 1.3 MG/M2 subcutaneously on days 1, 4, 8 and 11 every 3 weeks in addition to Decadron 20 mg by mouth on days 1, 2, 4, 5, 8, 9, 11 and 12 every 3 weeks. First dose 05/07/2016. Status post one cycle discontinued secondary to intolerance.   CURRENT THERAPY:  1) systemic chemotherapy with Velcade 1.3 MG/M2 subcutaneously weekly in addition to Decadron 40 mg by mouth daily. Status post 9 weekly cycles. 2) Zometa 4 mg IV every 3 months. First dose in 05/05/2015  ADVANCED DIRECTIVE: She does not have advanced directive and was given information.  INTERVAL HISTORY: Darlene Howard 65 y.o. female returns to the clinic today for routine monthly followup visit accompanied by  several family members. The patient is doing fine today with no specific complaints. She continues to tolerate her treatment with weekly subcutaneous Velcade and Decadron fairly well. She status post 9 cycles. The patient denied having any fever or chills. She denied having any shortness of breath, cough or hemoptysis. She has no weight loss or night sweats. She denied having any peripheral neuropathy. She has repeat myeloma panel recently and she is here for evaluation and discussion of her lab results before proceeding with cycle #10.  MEDICAL HISTORY: Past Medical History:  Diagnosis Date  . Bruises easily    d/t being on COumadin  . Cholelithiasis   . DDD (degenerative disc disease), lumbar   . Fatty liver   . GERD (gastroesophageal reflux disease)    takes Pantoprazole daily  . Glaucoma    uses eye drops as instructed  . H/O stem cell transplant ([place])    04-26-2006  AT [facility]  . Herpes    takes Valtrex as needed  . History of colon polyps   . Hypertension    takes Amlodipine and Diovan daily  . Lung cancer ([place])    chest wall lesion aadn 6th rib  . Multiple myeloma DX  2006  STATE IIA,  IgA  KAPPA   STABLE  PER DR Arelene Moroni--  S/P STEM CELL TRANSPLANT 2007 /  RECURRENCE 2008  CHEMO ENDED 03-30-2008/   NOW ON MAINTANANCE REVLIMID )  . 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 Personal history of colon cancer  ASCENDING COLON--  S/P  RIGHT HEMICOLECTOMY WITH NEGATIVE NODES/    NO RECURRENCE  . Type 2 diabetes mellitus ([place])   . Vocal cord nodule    resolved- see Dr. Deeann Saint notes in media tab 06/17/14    ALLERGIES:  is allergic to codeine and tramadol.  MEDICATIONS:  Current Outpatient Prescriptions  Medication Sig Dispense Refill  . acetaminophen (TYLENOL) 500 MG tablet Take 500 mg by mouth daily as needed for moderate pain. Reported on 06/25/2016    . aspirin (ASPIRIN LOW DOSE) 81 MG EC tablet Take 81 mg by mouth daily.       . calcium-vitamin D (OSCAL 500/200 D-3) 500-200 MG-UNIT per tablet Take 1 tablet by mouth 3 (three) times daily.      . cholecalciferol (VITAMIN D) 1000 UNITS tablet Take 1,000 Units by mouth daily. Reported on 04/24/2016    . dexamethasone (DECADRON) 4 MG tablet Take 10 tabs (40 mg) once weekly while undergoing Velcade injections. 40 tablet 2  . dorzolamide-timolol (COSOPT) 22.3-6.8 MG/ML ophthalmic solution Place 1 drop into the left eye 2 (two) times daily.     Marland Kitchen gabapentin (NEURONTIN) 100 MG capsule Take 1 capsule by mouth 3  times daily 270 capsule 1  . loperamide (IMODIUM A-D) 2 MG tablet Take 2 mg by mouth daily as needed for diarrhea or loose stools. Reported on 07/09/2016    . loratadine (CLARITIN) 10 MG tablet TAKE ONE TABLET BY MOUTH ONCE DAILY. 30 tablet 3  . LUMIGAN 0.01 % SOLN Place 1 drop into both eyes at bedtime.     . Multiple Vitamins-Minerals (CENTRUM SILVER PO) Take 1 tablet by mouth daily.     . ondansetron (ZOFRAN) 4 MG tablet Take 1 tablet (4 mg total) by mouth every 6 (six) hours. (Patient not taking: Reported on 06/25/2016) 10 tablet 0  . ONE TOUCH ULTRA TEST test strip USE AS DIRECTED TO CHECK DAILY. (Patient not taking: Reported on 07/09/2016) 50 each 3  . oxyCODONE-acetaminophen (PERCOCET/ROXICET) 5-325 MG tablet Take 1 tablet by mouth every 8 (eight) hours as needed for severe pain. (Patient not taking: Reported on 06/25/2016) 40 tablet 0  . pantoprazole (PROTONIX) 40 MG tablet TAKE 1 TABLET BY MOUTH  DAILY FOR ACID REFLUX 90 tablet 1  . potassium chloride SA (K-DUR,KLOR-CON) 20 MEQ tablet Take 1 tablet by mouth  twice a day 180 tablet 0  . PREMARIN vaginal cream PLACE 1 APPLICATORFUL VAGINALLY TWICE A WEEK. 30 g 0  . temazepam (RESTORIL) 15 MG capsule TAKE 1 CAPSULE AT BEDTIME AS NEEDED FOR SLEEP. 30 capsule 2  . valACYclovir (VALTREX) 1000 MG tablet Take 1 tablet by mouth  daily as needed 90 tablet 0  . valsartan-hydrochlorothiazide (DIOVAN-HCT) 320-25 MG tablet Take  1 tablet by mouth  daily 90 tablet 1   No current facility-administered medications for this visit.     SURGICAL HISTORY:  Past Surgical History:  Procedure Laterality Date  . ABDOMINAL HYSTERECTOMY  1983   w/  appendectomy and left salpingoophorectom  . CATARACT EXTRACTION W/PHACO Left 01/20/2014   Procedure: CATARACT EXTRACTION PHACO AND INTRAOCULAR LENS PLACEMENT (IOC);  Surgeon: Adonis Brook, MD;  Location: [place] [facility];  Service: Ophthalmology;  Laterality: Left;  . CERVICAL CONE BIOPSY    . CHOLECYSTECTOMY N/A 02/28/2016   Procedure: LAPAROSCOPIC CHOLECYSTECTOMY;  Surgeon: Ralene Ok, MD;  Location: [place] [facility];  Service: General;  Laterality: N/A;  . CO2  ABLATION VULVA PERIANAL AND VAGINAL DYSPLATIC AREAS  02-15-2010  . COLONOSCOPY  10/07/2006  VQM:GQQPYP rectum/Status post right hemicolectomy. Normal residual colon  . COLONOSCOPY  10/2009   RMR: diminutive RS polyp (hyperplastic).   . COLONOSCOPY N/A 12/15/2014   Procedure: COLONOSCOPY;  Surgeon: Daneil Dolin, MD;  [facility];  Service: Endoscopy;  Laterality: N/A;  130   . ESOPHAGOGASTRODUODENOSCOPY  04/02/2005   PJK:[id number]/[id number] polyp with central erosion versus ulceration in the body of the stomach, resected and recovered Remainder of the gastric mucosa, D1, D2 appeared normal  . HEMICOLECTOMY Right 04-27-2005   CARCINOMA   ASCENDING COLON  . LESION REMOVAL N/A 05/06/2014   Procedure: LASER OF THE VAGINA;  Surgeon: Janie Morning, MD;  Location: [facility];  Service: Gynecology;  Laterality: N/A;  . LIMBAL STEM CELL TRANSPLANT  04-26-2006    ([facility])  . VULVAR LESION REMOVAL N/A 05/06/2014   Procedure: LASER OF VULVAR LESION;  Surgeon: Janie Morning, MD;  Location: [facility];  Service: Gynecology;  Laterality: N/A;  . VULVECTOMY N/A 05/06/2014   Procedure: WIDE LOCAL EXCISION LEFT LABIA MAJORA;  Surgeon: Janie Morning, MD;  Location: [place];  Service: Gynecology;   Laterality: N/A;    REVIEW OF SYSTEMS:  Constitutional: negative Eyes: negative Ears, nose, mouth, throat, and face: negative Respiratory: negative Cardiovascular: negative Gastrointestinal: negative Genitourinary:negative Integument/breast: negative Hematologic/lymphatic: negative Musculoskeletal:negative Neurological: negative Behavioral/Psych: negative Endocrine: negative Allergic/Immunologic: negative   PHYSICAL EXAMINATION: General appearance: alert, cooperative and no distress Head: Normocephalic, without obvious abnormality, atraumatic Neck: no adenopathy Lymph nodes: Cervical, supraclavicular, and axillary nodes normal. Resp: clear to auscultation bilaterally Back: symmetric, no curvature. ROM normal. No CVA tenderness. Cardio: regular rate and rhythm, S1, S2 normal, no murmur, click, rub or gallop GI: soft, non-tender; bowel sounds normal; no masses,  no organomegaly Extremities: extremities normal, atraumatic, no cyanosis or edema Neurologic: Alert and oriented X 3, normal strength and tone. Normal symmetric reflexes. Normal coordination and gait  ECOG PERFORMANCE STATUS: 1 - Symptomatic but completely ambulatory  Blood pressure (!) 158/71, pulse 88, temperature 97.8 F (36.6 C), temperature source Oral, resp. rate 18, weight 183 lb 9.6 oz (83.3 kg), SpO2 100 %.  LABORATORY DATA: Lab Results  Component Value Date   WBC 11.9 (H) 08/27/2016   HGB 9.2 (L) 08/27/2016   HCT 29.3 (L) 08/27/2016   MCV 101.0 08/27/2016   PLT 116 (L) 08/27/2016      Chemistry      Component Value Date/Time   NA 143 08/27/2016 0849   K 4.0 08/27/2016 0849   CL 110 08/23/2016 1000   CL 105 06/17/2013 1441   CO2 24 08/27/2016 0849   BUN 13.9 08/27/2016 0849   CREATININE 0.9 08/27/2016 0849      Component Value Date/Time   CALCIUM 9.3 08/27/2016 0849   ALKPHOS 75 08/27/2016 0849   AST 24 08/27/2016 0849   ALT 27 08/27/2016 0849   BILITOT 0.72 08/27/2016 0849        RADIOGRAPHIC STUDIES: Mm Screening Breast Tomo Bilateral  Result Date: 08/13/2016 CLINICAL DATA:  Screening. EXAM: 2D DIGITAL SCREENING BILATERAL MAMMOGRAM WITH CAD AND ADJUNCT TOMO COMPARISON:  Previous exam(s). ACR Breast Density Category b: There are scattered areas of fibroglandular density. FINDINGS: There are no findings suspicious for malignancy. Images were processed with CAD. IMPRESSION: No mammographic evidence of malignancy. A result letter of this screening mammogram will be mailed directly to the patient. RECOMMENDATION: Screening mammogram in one year. (Code:SM-B-01Y) BI-RADS CATEGORY  1: Negative. Electronically Signed   By: Oval Linsey  Glennon Mac M.D.   On: 08/13/2016 14:19    ASSESSMENT AND PLAN: This is a very pleasant 65 years old [place] American female with history of multiple myeloma currently on treatment with Revlimid and low-dose Decadron and tolerated her treatment fairly well. The patient is doing fine today with no specific complaints except for mild fatigue.  Because of disease progression, I recommended for her to continue her treatment with Revlimid and Decadron but I increased the dose of Revlimid to 25 mg by mouth daily for 21 days every 4 weeks, status post 12 months of this regimen. The recent myeloma panel performed at [facility] showed evidence for disease progression. The patient also has enlarging right-sided chest wall lesion. She underwent palliative radiotherapy to the enlarging right-posterior seventh rib lesion. She was treated with 1 cycle of chemotherapy with Daratumumab, Velcade and Decadron but this was discontinued secondary to intolerance and pancytopenia. She is currently undergoing treatment with weekly subcutaneous Velcade in addition to weekly Decadron. Status post 9 cycles. The myeloma panel performed recently showed improvement of her disease. I discussed the lab result with a copy of the report to the patient. I recommended for the  patient to proceed with cycle #10 today as a scheduled. I recommended for the patient to come back for follow-up visit in 3 weeks with repeating myeloma panel for reevaluation of her disease. The patient will continue her treatment with Zometa every 3 months as a scheduled. She was advised to call immediately if she has any concerning symptoms and interval. The patient voices understanding of current disease status and treatment options and is in agreement with the current care plan.  All questions were answered. The patient knows to call the clinic with any problems, questions or concerns. We can certainly see the patient much sooner if necessary.  Disclaimer: This note was dictated with voice recognition software. Similar sounding words can inadvertently be transcribed and may not be corrected upon review.

## 2016-08-28 ENCOUNTER — Encounter: Payer: Self-pay | Admitting: Family Medicine

## 2016-08-28 ENCOUNTER — Ambulatory Visit (INDEPENDENT_AMBULATORY_CARE_PROVIDER_SITE_OTHER): Payer: Medicare Other | Admitting: Family Medicine

## 2016-08-28 VITALS — BP 124/72 | HR 99 | Resp 16 | Ht 66.0 in | Wt 186.0 lb

## 2016-08-28 DIAGNOSIS — Z23 Encounter for immunization: Secondary | ICD-10-CM

## 2016-08-28 DIAGNOSIS — M5410 Radiculopathy, site unspecified: Secondary | ICD-10-CM | POA: Diagnosis not present

## 2016-08-28 DIAGNOSIS — G4700 Insomnia, unspecified: Secondary | ICD-10-CM

## 2016-08-28 DIAGNOSIS — K219 Gastro-esophageal reflux disease without esophagitis: Secondary | ICD-10-CM

## 2016-08-28 DIAGNOSIS — I10 Essential (primary) hypertension: Secondary | ICD-10-CM

## 2016-08-28 MED ORDER — LORATADINE 10 MG PO TABS: 10.0000 mg | ORAL_TABLET | Freq: Every day | ORAL | 5 refills | Status: DC

## 2016-08-28 MED ORDER — POTASSIUM CHLORIDE CRYS ER 20 MEQ PO TBCR: 20.0000 meq | EXTENDED_RELEASE_TABLET | Freq: Two times a day (BID) | ORAL | 1 refills | Status: DC

## 2016-08-28 MED ORDER — VALSARTAN-HYDROCHLOROTHIAZIDE 320-25 MG PO TABS: ORAL_TABLET | ORAL | 1 refills | Status: DC

## 2016-08-28 NOTE — Patient Instructions (Addendum)
Wellness in 3 month, call if you need me before  Flu vaccine today   Excellent Labs you are no longer diabetic, and blood pressure is excellent  Thank you  for choosing [facility]. We consider it a privelige to serve you.  Delivering excellent health care in a caring and  compassionate way is our goal.  Partnering with you,  so that together we can achieve this goal is our strategy.

## 2016-08-31 DIAGNOSIS — H401132 Primary open-angle glaucoma, bilateral, moderate stage: Secondary | ICD-10-CM | POA: Diagnosis not present

## 2016-08-31 DIAGNOSIS — H2513 Age-related nuclear cataract, bilateral: Secondary | ICD-10-CM | POA: Diagnosis not present

## 2016-08-31 DIAGNOSIS — H04123 Dry eye syndrome of bilateral lacrimal glands: Secondary | ICD-10-CM | POA: Diagnosis not present

## 2016-09-03 DIAGNOSIS — Z23 Encounter for immunization: Secondary | ICD-10-CM | POA: Insufficient documentation

## 2016-09-03 NOTE — Assessment & Plan Note (Signed)
Sleep hygiene reviewed and written information offered also. Prescription sent for  medication needed.  

## 2016-09-03 NOTE — Assessment & Plan Note (Signed)
After obtaining informed consent, the vaccine is  administered by LPN.  

## 2016-09-03 NOTE — Assessment & Plan Note (Signed)
Controlled, no change in medication  

## 2016-09-03 NOTE — Progress Notes (Signed)
   EVON DEJARNETT     MRN: [id number]      DOB: 1951/02/18   HPI Ms. Evans is here for follow up and re-evaluation of chronic medical conditions, medication management and review of any available recent lab and radiology data.  Preventive health is updated, specifically  Cancer screening and Immunization.   Questions or concerns regarding consultations or procedures which the PT has had in the interim are  addressed. The PT denies any adverse reactions to current medications since the last visit.  There are no new concerns.  There are no specific complaints   ROS Denies recent fever or chills. Denies sinus pressure, nasal congestion, ear pain or sore throat. Denies chest congestion, productive cough or wheezing. Denies chest pains, palpitations and leg swelling Denies abdominal pain, nausea, vomiting,diarrhea or constipation.   Denies dysuria, frequency, hesitancy or incontinence. Denies uncontrolled  joint pain, swelling and limitation in mobility. Denies headaches, seizures, numbness, or tingling. Denies depression, anxiety or insomnia. Denies skin break down or rash.   PE  BP 124/72   Pulse 99   Resp 16   Ht '5\' 6"'$  (1.676 m)   Wt 186 lb (84.4 kg)   SpO2 96%   BMI 30.02 kg/m   Patient alert and oriented and in no cardiopulmonary distress.  HEENT: No facial asymmetry, EOMI,   oropharynx pink and moist.  Neck supple no JVD, no mass.  Chest: Clear to auscultation bilaterally.  CVS: S1, S2 no murmurs, no S3.Regular rate.  ABD: Soft non tender.   Ext: No edema  MS: Adequate though educed ROM spine, shoulders, hips and knees.  Skin: Intact, no ulcerations or rash noted.  Psych: Good eye contact, normal affect. Memory intact not anxious or depressed appearing.  CNS: CN 2-12 intact, power,  normal throughout.no focal deficits noted.   Assessment & Plan  Essential hypertension Controlled, no change in medication DASH diet and commitment to daily physical activity for  a minimum of 30 minutes discussed and encouraged, as a part of hypertension management. The importance of attaining a healthy weight is also discussed.  BP/Weight 08/28/2016 08/27/2016 08/20/2016 08/13/2016 08/06/2016 07/30/2016 7/71/1657  Systolic BP 903 833 383 291 916 606 004  Diastolic BP 72 71 70 63 82 83 88  Wt. (Lbs) 186 183.6 - - - 183.4 -  BMI 30.02 29.63 - - - 29.6 -       Back pain with left-sided radiculopathy Controlled, no change in medication   GERD (gastroesophageal reflux disease) Controlled, no change in medication   Insomnia Sleep hygiene reviewed and written information offered also. Prescription sent for  medication needed.   Need for prophylactic vaccination and inoculation against influenza After obtaining informed consent, the vaccine is  administered by LPN.

## 2016-09-03 NOTE — Assessment & Plan Note (Signed)
Controlled, no change in medication DASH diet and commitment to daily physical activity for a minimum of 30 minutes discussed and encouraged, as a part of hypertension management. The importance of attaining a healthy weight is also discussed.  BP/Weight 08/28/2016 08/27/2016 08/20/2016 08/13/2016 08/06/2016 07/30/2016 7/82/9562  Systolic BP 130 865 784 696 295 284 132  Diastolic BP 72 71 70 63 82 83 88  Wt. (Lbs) 186 183.6 - - - 183.4 -  BMI 30.02 29.63 - - - 29.6 -

## 2016-09-04 ENCOUNTER — Other Ambulatory Visit (HOSPITAL_BASED_OUTPATIENT_CLINIC_OR_DEPARTMENT_OTHER): Payer: Medicare Other

## 2016-09-04 ENCOUNTER — Ambulatory Visit (HOSPITAL_BASED_OUTPATIENT_CLINIC_OR_DEPARTMENT_OTHER): Payer: Medicare Other

## 2016-09-04 VITALS — BP 150/74 | HR 88 | Temp 98.5°F | Resp 17

## 2016-09-04 DIAGNOSIS — C9002 Multiple myeloma in relapse: Secondary | ICD-10-CM

## 2016-09-04 DIAGNOSIS — Z5112 Encounter for antineoplastic immunotherapy: Secondary | ICD-10-CM | POA: Diagnosis present

## 2016-09-04 DIAGNOSIS — C9000 Multiple myeloma not having achieved remission: Secondary | ICD-10-CM

## 2016-09-04 LAB — CBC WITH DIFFERENTIAL/PLATELET
BASO%: 0.2 % (ref 0.0–2.0)
BASOS ABS: 0 10*3/uL (ref 0.0–0.1)
EOS ABS: 0.1 10*3/uL (ref 0.0–0.5)
EOS%: 0.5 % (ref 0.0–7.0)
HCT: 30.5 % — ABNORMAL LOW (ref 34.8–46.6)
HEMOGLOBIN: 9.4 g/dL — AB (ref 11.6–15.9)
LYMPH%: 9.8 % — AB (ref 14.0–49.7)
MCH: 31.3 pg (ref 25.1–34.0)
MCHC: 30.8 g/dL — ABNORMAL LOW (ref 31.5–36.0)
MCV: 101.7 fL — AB (ref 79.5–101.0)
MONO#: 1.1 10*3/uL — ABNORMAL HIGH (ref 0.1–0.9)
MONO%: 9.9 % (ref 0.0–14.0)
NEUT%: 79.6 % — ABNORMAL HIGH (ref 38.4–76.8)
NEUTROS ABS: 8.8 10*3/uL — AB (ref 1.5–6.5)
Platelets: 124 10*3/uL — ABNORMAL LOW (ref 145–400)
RBC: 3 10*6/uL — AB (ref 3.70–5.45)
RDW: 18.9 % — AB (ref 11.2–14.5)
WBC: 11.1 10*3/uL — AB (ref 3.9–10.3)
lymph#: 1.1 10*3/uL (ref 0.9–3.3)

## 2016-09-04 LAB — COMPREHENSIVE METABOLIC PANEL
ALBUMIN: 3 g/dL — AB (ref 3.5–5.0)
ALT: 65 U/L — ABNORMAL HIGH (ref 0–55)
AST: 44 U/L — AB (ref 5–34)
Alkaline Phosphatase: 97 U/L (ref 40–150)
Anion Gap: 10 mEq/L (ref 3–11)
BUN: 14.5 mg/dL (ref 7.0–26.0)
CO2: 28 meq/L (ref 22–29)
Calcium: 9.6 mg/dL (ref 8.4–10.4)
Chloride: 106 mEq/L (ref 98–109)
Creatinine: 0.9 mg/dL (ref 0.6–1.1)
EGFR: 74 mL/min/{1.73_m2} — AB (ref >90)
GLUCOSE: 124 mg/dL (ref 70–140)
POTASSIUM: 4 meq/L (ref 3.5–5.1)
SODIUM: 144 meq/L (ref 136–145)
Total Bilirubin: 0.94 mg/dL (ref 0.20–1.20)
Total Protein: 6.6 g/dL (ref 6.4–8.3)

## 2016-09-04 MED ORDER — PROCHLORPERAZINE MALEATE 10 MG PO TABS
10.0000 mg | ORAL_TABLET | Freq: Once | ORAL | Status: AC
  Administered 2016-09-04: 10 mg via ORAL

## 2016-09-04 MED ORDER — PROCHLORPERAZINE MALEATE 10 MG PO TABS
ORAL_TABLET | ORAL | Status: AC
  Filled 2016-09-04: qty 1

## 2016-09-04 MED ORDER — BORTEZOMIB CHEMO SQ INJECTION 3.5 MG (2.5MG/ML)
1.3000 mg/m2 | Freq: Once | INTRAMUSCULAR | Status: AC
  Administered 2016-09-04: 2.5 mg via SUBCUTANEOUS
  Filled 2016-09-04: qty 2.5

## 2016-09-04 NOTE — Patient Instructions (Signed)
[facility] Discharge Instructions for Patients Receiving Chemotherapy  Today you received the following chemotherapy agents Velcade. To help prevent nausea and vomiting after your treatment, we encourage you to take your nausea medication as directed.  If you develop nausea and vomiting that is not controlled by your nausea medication, call the clinic.   BELOW ARE SYMPTOMS THAT SHOULD BE REPORTED IMMEDIATELY:  *FEVER GREATER THAN 100.5 F  *CHILLS WITH OR WITHOUT FEVER  NAUSEA AND VOMITING THAT IS NOT CONTROLLED WITH YOUR NAUSEA MEDICATION  *UNUSUAL SHORTNESS OF BREATH  *UNUSUAL BRUISING OR BLEEDING  TENDERNESS IN MOUTH AND THROAT WITH OR WITHOUT PRESENCE OF ULCERS  *URINARY PROBLEMS  *BOWEL PROBLEMS  UNUSUAL RASH Items with * indicate a potential emergency and should be followed up as soon as possible.  Feel free to call the clinic you have any questions or concerns. The clinic phone number is [phone].  Please show the CHEMO ALERT CARD at check-in to the Emergency Department and triage nurse.    

## 2016-09-04 NOTE — Progress Notes (Signed)
Labs (CBC, CMET) reviewed by MD, ok to treat today.

## 2016-09-10 ENCOUNTER — Other Ambulatory Visit (HOSPITAL_BASED_OUTPATIENT_CLINIC_OR_DEPARTMENT_OTHER): Payer: Medicare Other

## 2016-09-10 ENCOUNTER — Ambulatory Visit (HOSPITAL_BASED_OUTPATIENT_CLINIC_OR_DEPARTMENT_OTHER): Payer: Medicare Other

## 2016-09-10 VITALS — BP 145/78 | HR 88 | Temp 98.0°F | Resp 17

## 2016-09-10 DIAGNOSIS — C9000 Multiple myeloma not having achieved remission: Secondary | ICD-10-CM

## 2016-09-10 DIAGNOSIS — Z5112 Encounter for antineoplastic immunotherapy: Secondary | ICD-10-CM

## 2016-09-10 DIAGNOSIS — C9002 Multiple myeloma in relapse: Secondary | ICD-10-CM

## 2016-09-10 LAB — COMPREHENSIVE METABOLIC PANEL
ALT: 45 U/L (ref 0–55)
ANION GAP: 9 meq/L (ref 3–11)
AST: 46 U/L — ABNORMAL HIGH (ref 5–34)
Albumin: 2.9 g/dL — ABNORMAL LOW (ref 3.5–5.0)
Alkaline Phosphatase: 93 U/L (ref 40–150)
BILIRUBIN TOTAL: 1.14 mg/dL (ref 0.20–1.20)
BUN: 12.9 mg/dL (ref 7.0–26.0)
CALCIUM: 8.9 mg/dL (ref 8.4–10.4)
CHLORIDE: 106 meq/L (ref 98–109)
CO2: 27 meq/L (ref 22–29)
CREATININE: 0.8 mg/dL (ref 0.6–1.1)
EGFR: 87 mL/min/{1.73_m2} — AB (ref >90)
Glucose: 120 mg/dl (ref 70–140)
Potassium: 3.6 mEq/L (ref 3.5–5.1)
Sodium: 142 mEq/L (ref 136–145)
TOTAL PROTEIN: 6.4 g/dL (ref 6.4–8.3)

## 2016-09-10 LAB — CBC WITH DIFFERENTIAL/PLATELET
BASO%: 0.1 % (ref 0.0–2.0)
Basophils Absolute: 0 10*3/uL (ref 0.0–0.1)
EOS ABS: 0.1 10*3/uL (ref 0.0–0.5)
EOS%: 0.5 % (ref 0.0–7.0)
HEMATOCRIT: 29.9 % — AB (ref 34.8–46.6)
HGB: 9.5 g/dL — ABNORMAL LOW (ref 11.6–15.9)
LYMPH#: 0.7 10*3/uL — AB (ref 0.9–3.3)
LYMPH%: 5 % — AB (ref 14.0–49.7)
MCH: 32 pg (ref 25.1–34.0)
MCHC: 31.8 g/dL (ref 31.5–36.0)
MCV: 100.7 fL (ref 79.5–101.0)
MONO#: 1.5 10*3/uL — AB (ref 0.1–0.9)
MONO%: 11.2 % (ref 0.0–14.0)
NEUT#: 10.8 10*3/uL — ABNORMAL HIGH (ref 1.5–6.5)
NEUT%: 83.2 % — AB (ref 38.4–76.8)
PLATELETS: 114 10*3/uL — AB (ref 145–400)
RBC: 2.97 10*6/uL — AB (ref 3.70–5.45)
RDW: 18.1 % — ABNORMAL HIGH (ref 11.2–14.5)
WBC: 12.9 10*3/uL — ABNORMAL HIGH (ref 3.9–10.3)

## 2016-09-10 MED ORDER — BORTEZOMIB CHEMO SQ INJECTION 3.5 MG (2.5MG/ML)
1.3000 mg/m2 | Freq: Once | INTRAMUSCULAR | Status: AC
  Administered 2016-09-10: 2.5 mg via SUBCUTANEOUS
  Filled 2016-09-10: qty 2.5

## 2016-09-10 MED ORDER — PROCHLORPERAZINE MALEATE 10 MG PO TABS
ORAL_TABLET | ORAL | Status: AC
  Filled 2016-09-10: qty 1

## 2016-09-10 MED ORDER — PROCHLORPERAZINE MALEATE 10 MG PO TABS
10.0000 mg | ORAL_TABLET | Freq: Once | ORAL | Status: AC
  Administered 2016-09-10: 10 mg via ORAL

## 2016-09-10 NOTE — Patient Instructions (Signed)
[facility] Discharge Instructions for Patients Receiving Chemotherapy  Today you received the following chemotherapy agents Velcade. To help prevent nausea and vomiting after your treatment, we encourage you to take your nausea medication as directed.  If you develop nausea and vomiting that is not controlled by your nausea medication, call the clinic.   BELOW ARE SYMPTOMS THAT SHOULD BE REPORTED IMMEDIATELY:  *FEVER GREATER THAN 100.5 F  *CHILLS WITH OR WITHOUT FEVER  NAUSEA AND VOMITING THAT IS NOT CONTROLLED WITH YOUR NAUSEA MEDICATION  *UNUSUAL SHORTNESS OF BREATH  *UNUSUAL BRUISING OR BLEEDING  TENDERNESS IN MOUTH AND THROAT WITH OR WITHOUT PRESENCE OF ULCERS  *URINARY PROBLEMS  *BOWEL PROBLEMS  UNUSUAL RASH Items with * indicate a potential emergency and should be followed up as soon as possible.  Feel free to call the clinic you have any questions or concerns. The clinic phone number is [phone].  Please show the CHEMO ALERT CARD at check-in to the Emergency Department and triage nurse.    

## 2016-09-17 ENCOUNTER — Ambulatory Visit (HOSPITAL_BASED_OUTPATIENT_CLINIC_OR_DEPARTMENT_OTHER): Payer: Medicare Other

## 2016-09-17 ENCOUNTER — Other Ambulatory Visit (HOSPITAL_BASED_OUTPATIENT_CLINIC_OR_DEPARTMENT_OTHER): Payer: Medicare Other

## 2016-09-17 ENCOUNTER — Ambulatory Visit (HOSPITAL_BASED_OUTPATIENT_CLINIC_OR_DEPARTMENT_OTHER): Payer: Medicare Other | Admitting: Internal Medicine

## 2016-09-17 ENCOUNTER — Encounter: Payer: Self-pay | Admitting: Internal Medicine

## 2016-09-17 VITALS — BP 148/76 | HR 91 | Temp 97.7°F | Resp 18 | Ht 66.0 in | Wt 187.5 lb

## 2016-09-17 DIAGNOSIS — Z5112 Encounter for antineoplastic immunotherapy: Secondary | ICD-10-CM | POA: Diagnosis present

## 2016-09-17 DIAGNOSIS — C9002 Multiple myeloma in relapse: Secondary | ICD-10-CM

## 2016-09-17 DIAGNOSIS — I10 Essential (primary) hypertension: Secondary | ICD-10-CM

## 2016-09-17 DIAGNOSIS — C9000 Multiple myeloma not having achieved remission: Secondary | ICD-10-CM

## 2016-09-17 DIAGNOSIS — Z5111 Encounter for antineoplastic chemotherapy: Secondary | ICD-10-CM

## 2016-09-17 LAB — CBC WITH DIFFERENTIAL/PLATELET
BASO%: 0.3 % (ref 0.0–2.0)
Basophils Absolute: 0 10*3/uL (ref 0.0–0.1)
EOS%: 0.4 % (ref 0.0–7.0)
Eosinophils Absolute: 0.1 10*3/uL (ref 0.0–0.5)
HEMATOCRIT: 31.7 % — AB (ref 34.8–46.6)
HEMOGLOBIN: 10 g/dL — AB (ref 11.6–15.9)
LYMPH#: 0.6 10*3/uL — AB (ref 0.9–3.3)
LYMPH%: 4.8 % — ABNORMAL LOW (ref 14.0–49.7)
MCH: 31.7 pg (ref 25.1–34.0)
MCHC: 31.4 g/dL — ABNORMAL LOW (ref 31.5–36.0)
MCV: 100.8 fL (ref 79.5–101.0)
MONO#: 1.2 10*3/uL — AB (ref 0.1–0.9)
MONO%: 9.6 % (ref 0.0–14.0)
NEUT%: 84.9 % — ABNORMAL HIGH (ref 38.4–76.8)
NEUTROS ABS: 11 10*3/uL — AB (ref 1.5–6.5)
PLATELETS: 119 10*3/uL — AB (ref 145–400)
RBC: 3.14 10*6/uL — AB (ref 3.70–5.45)
RDW: 19.9 % — AB (ref 11.2–14.5)
WBC: 12.9 10*3/uL — AB (ref 3.9–10.3)

## 2016-09-17 LAB — COMPREHENSIVE METABOLIC PANEL
ALT: 34 U/L (ref 0–55)
ANION GAP: 9 meq/L (ref 3–11)
AST: 41 U/L — ABNORMAL HIGH (ref 5–34)
Albumin: 3 g/dL — ABNORMAL LOW (ref 3.5–5.0)
Alkaline Phosphatase: 90 U/L (ref 40–150)
BILIRUBIN TOTAL: 1.01 mg/dL (ref 0.20–1.20)
BUN: 10 mg/dL (ref 7.0–26.0)
CALCIUM: 8.9 mg/dL (ref 8.4–10.4)
CO2: 29 meq/L (ref 22–29)
CREATININE: 0.8 mg/dL (ref 0.6–1.1)
Chloride: 107 mEq/L (ref 98–109)
EGFR: >90 mL/min/{1.73_m2} (ref >90)
Glucose: 119 mg/dl (ref 70–140)
Potassium: 4 mEq/L (ref 3.5–5.1)
Sodium: 144 mEq/L (ref 136–145)
TOTAL PROTEIN: 6.5 g/dL (ref 6.4–8.3)

## 2016-09-17 MED ORDER — PROCHLORPERAZINE MALEATE 10 MG PO TABS
ORAL_TABLET | ORAL | Status: AC
  Filled 2016-09-17: qty 1

## 2016-09-17 MED ORDER — BORTEZOMIB CHEMO SQ INJECTION 3.5 MG (2.5MG/ML)
1.3000 mg/m2 | Freq: Once | INTRAMUSCULAR | Status: AC
  Administered 2016-09-17: 2.5 mg via SUBCUTANEOUS
  Filled 2016-09-17: qty 2.5

## 2016-09-17 MED ORDER — PROCHLORPERAZINE MALEATE 10 MG PO TABS
10.0000 mg | ORAL_TABLET | Freq: Once | ORAL | Status: AC
  Administered 2016-09-17: 10 mg via ORAL

## 2016-09-17 NOTE — Patient Instructions (Signed)
[facility] Discharge Instructions for Patients Receiving Chemotherapy  Today you received the following chemotherapy agents Velcade. To help prevent nausea and vomiting after your treatment, we encourage you to take your nausea medication as directed.  If you develop nausea and vomiting that is not controlled by your nausea medication, call the clinic.   BELOW ARE SYMPTOMS THAT SHOULD BE REPORTED IMMEDIATELY:  *FEVER GREATER THAN 100.5 F  *CHILLS WITH OR WITHOUT FEVER  NAUSEA AND VOMITING THAT IS NOT CONTROLLED WITH YOUR NAUSEA MEDICATION  *UNUSUAL SHORTNESS OF BREATH  *UNUSUAL BRUISING OR BLEEDING  TENDERNESS IN MOUTH AND THROAT WITH OR WITHOUT PRESENCE OF ULCERS  *URINARY PROBLEMS  *BOWEL PROBLEMS  UNUSUAL RASH Items with * indicate a potential emergency and should be followed up as soon as possible.  Feel free to call the clinic you have any questions or concerns. The clinic phone number is [phone].  Please show the CHEMO ALERT CARD at check-in to the Emergency Department and triage nurse.    

## 2016-09-17 NOTE — Progress Notes (Signed)
[facility] [phone]   [phone]  OFFICE PROGRESS NOTE  Darlene Nakayama, MD [address], [address] [place] [address]  DIAGNOSIS: Stage IIA IgA kappa multiple myeloma diagnosed in 2006   PRIOR THERAPY:  1) status post treatment with thalidomide and Decadron followed by autologous peripheral blood stem cell transplant with high-dose melphalan on 04/26/2006 at [facility].  2) the patient had evidence for disease recurrence in August of 2008 and she was treated with 6 cycles of Velcade completed on 03/30/2008 with response to the treatment but was discontinued secondary to neuropathy.  3) maintenance treatment with Revlimid 10 mg by mouth daily by the does was later increase to 15 mg by mouth daily secondary to biochemical recurrence with stabilization of her disease. The treatment with Revlimid 15 mg by mouth daily was discontinued secondary to evidence for disease progression. 4) Revlimid 25 mg by mouth daily for 3 weeks every 4 weeks in addition to Decadron currently at 20 mg by mouth on a weekly basis. She is status post 12 cycles. This was discontinued secondary to disease progression. 5) systemic chemotherapy with Daratumumab 16 MG/KG weekly for the first 9 weeks, Velcade 1.3 MG/M2 subcutaneously on days 1, 4, 8 and 11 every 3 weeks in addition to Decadron 20 mg by mouth on days 1, 2, 4, 5, 8, 9, 11 and 12 every 3 weeks. First dose 05/07/2016. Status post one cycle discontinued secondary to intolerance.   CURRENT THERAPY:  1) systemic chemotherapy with Velcade 1.3 MG/M2 subcutaneously weekly in addition to Decadron 40 mg by mouth daily. Status post 12 weekly cycles. 2) Zometa 4 mg IV every 3 months. First dose in 05/05/2015  ADVANCED DIRECTIVE: She does not have advanced directive and was given information.  INTERVAL HISTORY: Darlene Howard 65 y.o. female returns to the clinic today for routine monthly followup visit accompanied  by her brother. The patient is doing fine today with no specific complaints. She continues to tolerate her treatment with weekly subcutaneous Velcade and Decadron fairly well. She status post 12 cycles. The patient denied having any fever or chills. She denied having any shortness of breath, cough or hemoptysis. She has no weight loss or night sweats. She denied having any peripheral neuropathy. She is here today for evaluation before proceeding with cycle #13.  MEDICAL HISTORY: Past Medical History:  Diagnosis Date  . Bruises easily    d/t being on COumadin  . Cholelithiasis   . DDD (degenerative disc disease), lumbar   . Fatty liver   . GERD (gastroesophageal reflux disease)    takes Pantoprazole daily  . Glaucoma    uses eye drops as instructed  . H/O stem cell transplant ([place])    04-26-2006  AT [facility]  . Herpes    takes Valtrex as needed  . History of colon polyps   . Hypertension    takes Amlodipine and Diovan daily  . Lung cancer ([place])    chest wall lesion aadn 6th rib  . Multiple myeloma DX  2006  STATE IIA,  IgA  KAPPA   STABLE  PER DR Lukasz Rogus--  S/P STEM CELL TRANSPLANT 2007 /  RECURRENCE 2008  CHEMO ENDED 03-30-2008/   NOW ON MAINTANANCE REVLIMID )  . 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 Personal history of colon cancer    ASCENDING COLON--  S/P  RIGHT HEMICOLECTOMY WITH NEGATIVE NODES/  NO RECURRENCE  . Type 2 diabetes mellitus ([place])   . Vocal cord nodule    resolved- see Dr. Deeann Saint notes in media tab 06/17/14    ALLERGIES:  is allergic to codeine and tramadol.  MEDICATIONS:  Current Outpatient Prescriptions  Medication Sig Dispense Refill  . acetaminophen (TYLENOL) 500 MG tablet Take 500 mg by mouth daily as needed for moderate pain. Reported on 06/25/2016    . aspirin (ASPIRIN LOW DOSE) 81 MG EC tablet Take 81 mg by mouth daily.      . calcium-vitamin D (OSCAL 500/200 D-3) 500-200 MG-UNIT per tablet Take  1 tablet by mouth 3 (three) times daily.      . cholecalciferol (VITAMIN D) 1000 UNITS tablet Take 1,000 Units by mouth daily. Reported on 04/24/2016    . dexamethasone (DECADRON) 4 MG tablet Take 10 tabs (40 mg) once weekly while undergoing Velcade injections. 40 tablet 2  . dorzolamide-timolol (COSOPT) 22.3-6.8 MG/ML ophthalmic solution Place 1 drop into the left eye 2 (two) times daily.     Marland Kitchen gabapentin (NEURONTIN) 100 MG capsule Take 1 capsule by mouth 3  times daily 270 capsule 1  . loperamide (IMODIUM A-D) 2 MG tablet Take 2 mg by mouth daily as needed for diarrhea or loose stools. Reported on 07/09/2016    . loratadine (CLARITIN) 10 MG tablet Take 1 tablet (10 mg total) by mouth daily. 30 tablet 5  . LUMIGAN 0.01 % SOLN Place 1 drop into both eyes at bedtime.     . Multiple Vitamins-Minerals (CENTRUM SILVER PO) Take 1 tablet by mouth daily.     . ondansetron (ZOFRAN) 4 MG tablet Take 1 tablet (4 mg total) by mouth every 6 (six) hours. 10 tablet 0  . oxyCODONE-acetaminophen (PERCOCET/ROXICET) 5-325 MG tablet Take 1 tablet by mouth every 8 (eight) hours as needed for severe pain. 40 tablet 0  . pantoprazole (PROTONIX) 40 MG tablet TAKE 1 TABLET BY MOUTH  DAILY FOR ACID REFLUX 90 tablet 1  . potassium chloride SA (K-DUR,KLOR-CON) 20 MEQ tablet Take 1 tablet (20 mEq total) by mouth 2 (two) times daily. 180 tablet 1  . PREMARIN vaginal cream PLACE 1 APPLICATORFUL VAGINALLY TWICE A WEEK. 30 g 0  . temazepam (RESTORIL) 15 MG capsule TAKE 1 CAPSULE AT BEDTIME AS NEEDED FOR SLEEP. 30 capsule 2  . valACYclovir (VALTREX) 1000 MG tablet Take 1 tablet by mouth  daily as needed 90 tablet 0  . valsartan-hydrochlorothiazide (DIOVAN-HCT) 320-25 MG tablet Take 1 tablet by mouth  daily 90 tablet 1   No current facility-administered medications for this visit.     SURGICAL HISTORY:  Past Surgical History:  Procedure Laterality Date  . ABDOMINAL HYSTERECTOMY  1983   w/  appendectomy and left  salpingoophorectom  . CATARACT EXTRACTION W/PHACO Left 01/20/2014   Procedure: CATARACT EXTRACTION PHACO AND INTRAOCULAR LENS PLACEMENT (IOC);  Surgeon: Adonis Brook, MD;  Location: [facility];  Service: Ophthalmology;  Laterality: Left;  . CERVICAL CONE BIOPSY    . CHOLECYSTECTOMY N/A 02/28/2016   Procedure: LAPAROSCOPIC CHOLECYSTECTOMY;  Surgeon: Ralene Ok, MD;  Location: [facility];  Service: General;  Laterality: N/A;  . CO2  ABLATION VULVA PERIANAL AND VAGINAL DYSPLATIC AREAS  02-15-2010  . COLONOSCOPY  10/07/2006   BWI:OMBTDH rectum/Status post right hemicolectomy. Normal residual colon  . COLONOSCOPY  10/2009   RMR: diminutive RS polyp (hyperplastic).   . COLONOSCOPY N/A 12/15/2014   Procedure: COLONOSCOPY;  Surgeon: Daneil Dolin, MD;  [facility];  Service: Endoscopy;  Laterality: N/A;  130   . ESOPHAGOGASTRODUODENOSCOPY  04/02/2005   WER:[id number] polyp with central erosion versus ulceration in the body of the stomach, resected and recovered Remainder of the gastric mucosa, D1, D2 appeared normal  . HEMICOLECTOMY Right 04-27-2005   CARCINOMA   ASCENDING COLON  . LESION REMOVAL N/A 05/06/2014   Procedure: LASER OF THE VAGINA;  Surgeon: Janie Morning, MD;  Location[facility];  Service: Gynecology;  Laterality: N/A;  . LIMBAL STEM CELL TRANSPLANT  04-26-2006    ([facility])  . VULVAR LESION REMOVAL N/A 05/06/2014   Procedure: LASER OF VULVAR LESION;  Surgeon: Janie Morning, MD;  Location: [facility];  Service: Gynecology;  Laterality: N/A;  . VULVECTOMY N/A 05/06/2014   Procedure: WIDE LOCAL EXCISION LEFT LABIA MAJORA;  Surgeon: Janie Morning, MD;  Location: [facility];  Service: Gynecology;  Laterality: N/A;    REVIEW OF SYSTEMS:  A comprehensive review of systems was negative.   PHYSICAL EXAMINATION: General appearance: alert, cooperative and no distress Head: Normocephalic, without obvious abnormality, atraumatic Neck:  no adenopathy Lymph nodes: Cervical, supraclavicular, and axillary nodes normal. Resp: clear to auscultation bilaterally Back: symmetric, no curvature. ROM normal. No CVA tenderness. Cardio: regular rate and rhythm, S1, S2 normal, no murmur, click, rub or gallop GI: soft, non-tender; bowel sounds normal; no masses,  no organomegaly Extremities: extremities normal, atraumatic, no cyanosis or edema Neurologic: Alert and oriented X 3, normal strength and tone. Normal symmetric reflexes. Normal coordination and gait  ECOG PERFORMANCE STATUS: 1 - Symptomatic but completely ambulatory  Blood pressure (!) 148/76, pulse 91, temperature 97.7 F (36.5 C), temperature source Oral, resp. rate 18, height _0  (1.676 m), weight 187 lb 8 oz (85 kg), SpO2 98 %.  LABORATORY DATA: Lab Results  Component Value Date   WBC 12.9 (H) 09/17/2016   HGB 10.0 (L) 09/17/2016   HCT 31.7 (L) 09/17/2016   MCV 100.8 09/17/2016   PLT 119 (L) 09/17/2016      Chemistry      Component Value Date/Time   NA 144 09/17/2016 1038   K 4.0 09/17/2016 1038   CL 110 08/23/2016 1000   CL 105 06/17/2013 1441   CO2 29 09/17/2016 1038   BUN 10.0 09/17/2016 1038   CREATININE 0.8 09/17/2016 1038      Component Value Date/Time   CALCIUM 8.9 09/17/2016 1038   ALKPHOS 90 09/17/2016 1038   AST 41 (H) 09/17/2016 1038   ALT 34 09/17/2016 1038   BILITOT 1.01 09/17/2016 1038       RADIOGRAPHIC STUDIES: No results found.  ASSESSMENT AND PLAN: This is a very pleasant 65 years old [place] American female with history of multiple myeloma currently on treatment with Revlimid and low-dose Decadron and tolerated her treatment fairly well. The patient is doing fine today with no specific complaints except for mild fatigue.  Because of disease progression, I recommended for her to continue her treatment with Revlimid and Decadron but I increased the dose of Revlimid to 25 mg by mouth daily for 21 days every 4 weeks, status post 12  months of this regimen. The recent myeloma panel performed at [facility] showed evidence for disease progression. The patient also has enlarging right-sided chest wall lesion. She underwent palliative radiotherapy to the enlarging right-posterior seventh rib lesion. She was treated with 1 cycle of chemotherapy with Daratumumab, Velcade and Decadron but this was discontinued secondary to intolerance and pancytopenia. She is currently  undergoing treatment with weekly subcutaneous Velcade in addition to weekly Decadron. Status post 12 cycles. She is tolerating her treatment well. I recommended for the patient to proceed with cycle #13 today as a scheduled. I recommended for the patient to come back for follow-up visit in 3 weeks with repeating myeloma panel for reevaluation of her disease. The patient will continue her treatment with Zometa every 3 months as a scheduled. She was advised to call immediately if she has any concerning symptoms and interval. The patient voices understanding of current disease status and treatment options and is in agreement with the current care plan.  All questions were answered. The patient knows to call the clinic with any problems, questions or concerns. We can certainly see the patient much sooner if necessary.  Disclaimer: This note was dictated with voice recognition software. Similar sounding words can inadvertently be transcribed and may not be corrected upon review.

## 2016-09-22 ENCOUNTER — Other Ambulatory Visit: Payer: Self-pay | Admitting: Nurse Practitioner

## 2016-09-24 ENCOUNTER — Ambulatory Visit (HOSPITAL_BASED_OUTPATIENT_CLINIC_OR_DEPARTMENT_OTHER): Payer: Medicare Other

## 2016-09-24 ENCOUNTER — Other Ambulatory Visit (HOSPITAL_BASED_OUTPATIENT_CLINIC_OR_DEPARTMENT_OTHER): Payer: Medicare Other

## 2016-09-24 VITALS — BP 136/69 | HR 89 | Temp 97.8°F | Resp 16

## 2016-09-24 DIAGNOSIS — C9002 Multiple myeloma in relapse: Secondary | ICD-10-CM

## 2016-09-24 DIAGNOSIS — Z5112 Encounter for antineoplastic immunotherapy: Secondary | ICD-10-CM

## 2016-09-24 DIAGNOSIS — C9000 Multiple myeloma not having achieved remission: Secondary | ICD-10-CM

## 2016-09-24 LAB — CBC WITH DIFFERENTIAL/PLATELET
BASO%: 0.2 % (ref 0.0–2.0)
Basophils Absolute: 0 10*3/uL (ref 0.0–0.1)
EOS%: 0.5 % (ref 0.0–7.0)
Eosinophils Absolute: 0.1 10*3/uL (ref 0.0–0.5)
HCT: 33.5 % — ABNORMAL LOW (ref 34.8–46.6)
HGB: 10.5 g/dL — ABNORMAL LOW (ref 11.6–15.9)
LYMPH%: 7.1 % — AB (ref 14.0–49.7)
MCH: 31.7 pg (ref 25.1–34.0)
MCHC: 31.3 g/dL — AB (ref 31.5–36.0)
MCV: 101.3 fL — AB (ref 79.5–101.0)
MONO#: 1.1 10*3/uL — ABNORMAL HIGH (ref 0.1–0.9)
MONO%: 9.9 % (ref 0.0–14.0)
NEUT#: 9.5 10*3/uL — ABNORMAL HIGH (ref 1.5–6.5)
NEUT%: 82.3 % — ABNORMAL HIGH (ref 38.4–76.8)
PLATELETS: 133 10*3/uL — AB (ref 145–400)
RBC: 3.31 10*6/uL — AB (ref 3.70–5.45)
RDW: 19 % — AB (ref 11.2–14.5)
WBC: 11.5 10*3/uL — AB (ref 3.9–10.3)
lymph#: 0.8 10*3/uL — ABNORMAL LOW (ref 0.9–3.3)

## 2016-09-24 LAB — COMPREHENSIVE METABOLIC PANEL
ALT: 21 U/L (ref 0–55)
ANION GAP: 10 meq/L (ref 3–11)
AST: 20 U/L (ref 5–34)
Albumin: 3.1 g/dL — ABNORMAL LOW (ref 3.5–5.0)
Alkaline Phosphatase: 79 U/L (ref 40–150)
BUN: 17 mg/dL (ref 7.0–26.0)
CALCIUM: 9.3 mg/dL (ref 8.4–10.4)
CHLORIDE: 106 meq/L (ref 98–109)
CO2: 27 meq/L (ref 22–29)
Creatinine: 0.9 mg/dL (ref 0.6–1.1)
EGFR: 81 mL/min/{1.73_m2} — AB (ref >90)
Glucose: 123 mg/dl (ref 70–140)
POTASSIUM: 4.2 meq/L (ref 3.5–5.1)
Sodium: 143 mEq/L (ref 136–145)
Total Bilirubin: 0.83 mg/dL (ref 0.20–1.20)
Total Protein: 6.6 g/dL (ref 6.4–8.3)

## 2016-09-24 MED ORDER — PROCHLORPERAZINE MALEATE 10 MG PO TABS
ORAL_TABLET | ORAL | Status: AC
  Filled 2016-09-24: qty 1

## 2016-09-24 MED ORDER — BORTEZOMIB CHEMO SQ INJECTION 3.5 MG (2.5MG/ML)
1.3000 mg/m2 | Freq: Once | INTRAMUSCULAR | Status: AC
  Administered 2016-09-24: 2.5 mg via SUBCUTANEOUS
  Filled 2016-09-24: qty 2.5

## 2016-09-24 MED ORDER — PROCHLORPERAZINE MALEATE 10 MG PO TABS
10.0000 mg | ORAL_TABLET | Freq: Once | ORAL | Status: AC
  Administered 2016-09-24: 10 mg via ORAL

## 2016-09-24 NOTE — Patient Instructions (Signed)
[facility] Discharge Instructions for Patients Receiving Chemotherapy  Today you received the following chemotherapy agents Velcade. To help prevent nausea and vomiting after your treatment, we encourage you to take your nausea medication as directed.  If you develop nausea and vomiting that is not controlled by your nausea medication, call the clinic.   BELOW ARE SYMPTOMS THAT SHOULD BE REPORTED IMMEDIATELY:  *FEVER GREATER THAN 100.5 F  *CHILLS WITH OR WITHOUT FEVER  NAUSEA AND VOMITING THAT IS NOT CONTROLLED WITH YOUR NAUSEA MEDICATION  *UNUSUAL SHORTNESS OF BREATH  *UNUSUAL BRUISING OR BLEEDING  TENDERNESS IN MOUTH AND THROAT WITH OR WITHOUT PRESENCE OF ULCERS  *URINARY PROBLEMS  *BOWEL PROBLEMS  UNUSUAL RASH Items with * indicate a potential emergency and should be followed up as soon as possible.  Feel free to call the clinic you have any questions or concerns. The clinic phone number is [phone].  Please show the CHEMO ALERT CARD at check-in to the Emergency Department and triage nurse.    

## 2016-09-25 ENCOUNTER — Other Ambulatory Visit: Payer: Self-pay | Admitting: Nurse Practitioner

## 2016-09-26 ENCOUNTER — Other Ambulatory Visit: Payer: Self-pay | Admitting: *Deleted

## 2016-09-26 DIAGNOSIS — C9000 Multiple myeloma not having achieved remission: Secondary | ICD-10-CM

## 2016-09-26 MED ORDER — DEXAMETHASONE 4 MG PO TABS: ORAL_TABLET | ORAL | 2 refills | Status: DC

## 2016-10-01 ENCOUNTER — Ambulatory Visit (HOSPITAL_BASED_OUTPATIENT_CLINIC_OR_DEPARTMENT_OTHER): Payer: Medicare Other

## 2016-10-01 ENCOUNTER — Other Ambulatory Visit (HOSPITAL_BASED_OUTPATIENT_CLINIC_OR_DEPARTMENT_OTHER): Payer: Medicare Other

## 2016-10-01 VITALS — BP 116/61 | HR 94 | Temp 98.0°F | Resp 18

## 2016-10-01 DIAGNOSIS — C9002 Multiple myeloma in relapse: Secondary | ICD-10-CM

## 2016-10-01 DIAGNOSIS — Z5112 Encounter for antineoplastic immunotherapy: Secondary | ICD-10-CM | POA: Diagnosis present

## 2016-10-01 DIAGNOSIS — C9000 Multiple myeloma not having achieved remission: Secondary | ICD-10-CM

## 2016-10-01 LAB — COMPREHENSIVE METABOLIC PANEL
ALBUMIN: 3.1 g/dL — AB (ref 3.5–5.0)
ALK PHOS: 83 U/L (ref 40–150)
ALT: 56 U/L — ABNORMAL HIGH (ref 0–55)
ANION GAP: 10 meq/L (ref 3–11)
AST: 81 U/L — ABNORMAL HIGH (ref 5–34)
BILIRUBIN TOTAL: 0.87 mg/dL (ref 0.20–1.20)
BUN: 17.4 mg/dL (ref 7.0–26.0)
CO2: 26 mEq/L (ref 22–29)
Calcium: 9.2 mg/dL (ref 8.4–10.4)
Chloride: 106 mEq/L (ref 98–109)
Creatinine: 0.8 mg/dL (ref 0.6–1.1)
EGFR: 86 mL/min/{1.73_m2} — AB (ref >90)
Glucose: 131 mg/dl (ref 70–140)
Potassium: 4 mEq/L (ref 3.5–5.1)
Sodium: 141 mEq/L (ref 136–145)
TOTAL PROTEIN: 6.4 g/dL (ref 6.4–8.3)

## 2016-10-01 LAB — CBC WITH DIFFERENTIAL/PLATELET
BASO%: 0.1 % (ref 0.0–2.0)
BASOS ABS: 0 10*3/uL (ref 0.0–0.1)
EOS ABS: 0.1 10*3/uL (ref 0.0–0.5)
EOS%: 0.5 % (ref 0.0–7.0)
HCT: 33 % — ABNORMAL LOW (ref 34.8–46.6)
HEMOGLOBIN: 10.5 g/dL — AB (ref 11.6–15.9)
LYMPH%: 4.8 % — AB (ref 14.0–49.7)
MCH: 31.7 pg (ref 25.1–34.0)
MCHC: 31.7 g/dL (ref 31.5–36.0)
MCV: 100 fL (ref 79.5–101.0)
MONO#: 1.4 10*3/uL — AB (ref 0.1–0.9)
MONO%: 10.6 % (ref 0.0–14.0)
NEUT%: 84 % — ABNORMAL HIGH (ref 38.4–76.8)
NEUTROS ABS: 11.4 10*3/uL — AB (ref 1.5–6.5)
PLATELETS: 134 10*3/uL — AB (ref 145–400)
RBC: 3.3 10*6/uL — ABNORMAL LOW (ref 3.70–5.45)
RDW: 18 % — AB (ref 11.2–14.5)
WBC: 13.6 10*3/uL — AB (ref 3.9–10.3)
lymph#: 0.7 10*3/uL — ABNORMAL LOW (ref 0.9–3.3)

## 2016-10-01 MED ORDER — PROCHLORPERAZINE MALEATE 10 MG PO TABS
10.0000 mg | ORAL_TABLET | Freq: Once | ORAL | Status: AC
  Administered 2016-10-01: 10 mg via ORAL

## 2016-10-01 MED ORDER — BORTEZOMIB CHEMO SQ INJECTION 3.5 MG (2.5MG/ML)
1.3000 mg/m2 | Freq: Once | INTRAMUSCULAR | Status: AC
  Administered 2016-10-01: 2.5 mg via SUBCUTANEOUS
  Filled 2016-10-01: qty 2.5

## 2016-10-01 MED ORDER — PROCHLORPERAZINE MALEATE 10 MG PO TABS
ORAL_TABLET | ORAL | Status: AC
  Filled 2016-10-01: qty 1

## 2016-10-01 NOTE — Patient Instructions (Addendum)
[facility] Discharge Instructions for Patients Receiving Chemotherapy  Today you received the following chemotherapy agents Velcade. To help prevent nausea and vomiting after your treatment, we encourage you to take your nausea medication as directed.  If you develop nausea and vomiting that is not controlled by your nausea medication, call the clinic.   BELOW ARE SYMPTOMS THAT SHOULD BE REPORTED IMMEDIATELY:  *FEVER GREATER THAN 100.5 F  *CHILLS WITH OR WITHOUT FEVER  NAUSEA AND VOMITING THAT IS NOT CONTROLLED WITH YOUR NAUSEA MEDICATION  *UNUSUAL SHORTNESS OF BREATH  *UNUSUAL BRUISING OR BLEEDING  TENDERNESS IN MOUTH AND THROAT WITH OR WITHOUT PRESENCE OF ULCERS  *URINARY PROBLEMS  *BOWEL PROBLEMS  UNUSUAL RASH Items with * indicate a potential emergency and should be followed up as soon as possible.  Feel free to call the clinic you have any questions or concerns. The clinic phone number is [phone].  Please show the CHEMO ALERT CARD at check-in to the Emergency Department and triage nurse.    

## 2016-10-02 DIAGNOSIS — E1165 Type 2 diabetes mellitus with hyperglycemia: Secondary | ICD-10-CM | POA: Diagnosis not present

## 2016-10-02 DIAGNOSIS — B351 Tinea unguium: Secondary | ICD-10-CM | POA: Diagnosis not present

## 2016-10-08 ENCOUNTER — Ambulatory Visit (HOSPITAL_BASED_OUTPATIENT_CLINIC_OR_DEPARTMENT_OTHER): Payer: Medicare Other

## 2016-10-08 ENCOUNTER — Ambulatory Visit (HOSPITAL_BASED_OUTPATIENT_CLINIC_OR_DEPARTMENT_OTHER): Payer: Medicare Other | Admitting: Internal Medicine

## 2016-10-08 ENCOUNTER — Telehealth: Payer: Self-pay | Admitting: Internal Medicine

## 2016-10-08 ENCOUNTER — Encounter: Payer: Self-pay | Admitting: Internal Medicine

## 2016-10-08 ENCOUNTER — Other Ambulatory Visit (HOSPITAL_BASED_OUTPATIENT_CLINIC_OR_DEPARTMENT_OTHER): Payer: Medicare Other

## 2016-10-08 VITALS — BP 136/74 | HR 97 | Temp 98.0°F | Resp 18 | Ht 66.0 in | Wt 188.2 lb

## 2016-10-08 DIAGNOSIS — R5383 Other fatigue: Secondary | ICD-10-CM

## 2016-10-08 DIAGNOSIS — Z5112 Encounter for antineoplastic immunotherapy: Secondary | ICD-10-CM

## 2016-10-08 DIAGNOSIS — C9002 Multiple myeloma in relapse: Secondary | ICD-10-CM

## 2016-10-08 DIAGNOSIS — M5410 Radiculopathy, site unspecified: Secondary | ICD-10-CM | POA: Diagnosis not present

## 2016-10-08 DIAGNOSIS — C9000 Multiple myeloma not having achieved remission: Secondary | ICD-10-CM

## 2016-10-08 DIAGNOSIS — Z5111 Encounter for antineoplastic chemotherapy: Secondary | ICD-10-CM

## 2016-10-08 LAB — COMPREHENSIVE METABOLIC PANEL
ALT: 28 U/L (ref 0–55)
AST: 22 U/L (ref 5–34)
Albumin: 3.2 g/dL — ABNORMAL LOW (ref 3.5–5.0)
Alkaline Phosphatase: 75 U/L (ref 40–150)
Anion Gap: 9 mEq/L (ref 3–11)
BILIRUBIN TOTAL: 0.95 mg/dL (ref 0.20–1.20)
BUN: 16.6 mg/dL (ref 7.0–26.0)
CHLORIDE: 105 meq/L (ref 98–109)
CO2: 28 meq/L (ref 22–29)
CREATININE: 0.9 mg/dL (ref 0.6–1.1)
Calcium: 9.5 mg/dL (ref 8.4–10.4)
EGFR: 74 mL/min/{1.73_m2} — ABNORMAL LOW (ref >90)
Glucose: 125 mg/dl (ref 70–140)
Potassium: 4.1 mEq/L (ref 3.5–5.1)
Sodium: 141 mEq/L (ref 136–145)
TOTAL PROTEIN: 6.4 g/dL (ref 6.4–8.3)

## 2016-10-08 LAB — CBC WITH DIFFERENTIAL/PLATELET
BASO%: 0.6 % (ref 0.0–2.0)
Basophils Absolute: 0.1 10*3/uL (ref 0.0–0.1)
EOS%: 1.2 % (ref 0.0–7.0)
Eosinophils Absolute: 0.1 10*3/uL (ref 0.0–0.5)
HEMATOCRIT: 33.6 % — AB (ref 34.8–46.6)
HGB: 10.5 g/dL — ABNORMAL LOW (ref 11.6–15.9)
LYMPH#: 1 10*3/uL (ref 0.9–3.3)
LYMPH%: 8.3 % — AB (ref 14.0–49.7)
MCH: 31.5 pg (ref 25.1–34.0)
MCHC: 31.4 g/dL — AB (ref 31.5–36.0)
MCV: 100.5 fL (ref 79.5–101.0)
MONO#: 1 10*3/uL — AB (ref 0.1–0.9)
MONO%: 8.6 % (ref 0.0–14.0)
NEUT%: 81.3 % — AB (ref 38.4–76.8)
NEUTROS ABS: 9.8 10*3/uL — AB (ref 1.5–6.5)
PLATELETS: 137 10*3/uL — AB (ref 145–400)
RBC: 3.34 10*6/uL — AB (ref 3.70–5.45)
RDW: 17.7 % — ABNORMAL HIGH (ref 11.2–14.5)
WBC: 12 10*3/uL — AB (ref 3.9–10.3)

## 2016-10-08 MED ORDER — OXYCODONE-ACETAMINOPHEN 5-325 MG PO TABS: 1.0000 | ORAL_TABLET | Freq: Three times a day (TID) | ORAL | 0 refills | Status: DC | PRN

## 2016-10-08 MED ORDER — BORTEZOMIB CHEMO SQ INJECTION 3.5 MG (2.5MG/ML)
1.3000 mg/m2 | Freq: Once | INTRAMUSCULAR | Status: AC
  Administered 2016-10-08: 2.5 mg via SUBCUTANEOUS
  Filled 2016-10-08: qty 2.5

## 2016-10-08 MED ORDER — PROCHLORPERAZINE MALEATE 10 MG PO TABS
ORAL_TABLET | ORAL | Status: AC
  Filled 2016-10-08: qty 1

## 2016-10-08 MED ORDER — PROCHLORPERAZINE MALEATE 10 MG PO TABS
10.0000 mg | ORAL_TABLET | Freq: Once | ORAL | Status: AC
  Administered 2016-10-08: 10 mg via ORAL

## 2016-10-08 NOTE — Patient Instructions (Signed)

## 2016-10-08 NOTE — Progress Notes (Signed)
[name] [phone]   [phone]  OFFICE PROGRESS NOTE  [name], MD [address], [address] [place] [address]  DIAGNOSIS: Stage IIA IgA kappa multiple myeloma diagnosed in 2006   PRIOR THERAPY:  1) status post treatment with thalidomide and Decadron followed by autologous peripheral blood stem cell transplant with high-dose melphalan on 04/26/2006 at [facility].  2) the patient had evidence for disease recurrence in August of 2008 and she was treated with 6 cycles of Velcade completed on 03/30/2008 with response to the treatment but was discontinued secondary to neuropathy.  3) maintenance treatment with Revlimid 10 mg by mouth daily by the does was later increase to 15 mg by mouth daily secondary to biochemical recurrence with stabilization of her disease. The treatment with Revlimid 15 mg by mouth daily was discontinued secondary to evidence for disease progression. 4) Revlimid 25 mg by mouth daily for 3 weeks every 4 weeks in addition to Decadron currently at 20 mg by mouth on a weekly basis. She is status post 12 cycles. This was discontinued secondary to disease progression. 5) systemic chemotherapy with Daratumumab 16 MG/KG weekly for the first 9 weeks, Velcade 1.3 MG/M2 subcutaneously on days 1, 4, 8 and 11 every 3 weeks in addition to Decadron 20 mg by mouth on days 1, 2, 4, 5, 8, 9, 11 and 12 every 3 weeks. First dose 05/07/2016. Status post one cycle discontinued secondary to intolerance.   CURRENT THERAPY:  1) systemic chemotherapy with Velcade 1.3 MG/M2 subcutaneously weekly in addition to Decadron 40 mg by mouth daily. Status post 15 weekly cycles. 2) Zometa 4 mg IV every 3 months. First dose in 05/05/2015  ADVANCED DIRECTIVE: She does not have advanced directive and was given information.  INTERVAL HISTORY: Darlene Howard 65 y.o. female returns to the clinic today for routine monthly followup visit accompanied  by her brother and sister-in-law. The patient is doing fine today with no specific complaints except for back pain with mild radiation to the front. It is located at the site of previous radiotherapy. She is requesting refill of Percocet. She continues to tolerate her treatment with weekly subcutaneous Velcade and Decadron fairly well. She status post 15 cycles. The patient denied having any fever or chills. She denied having any shortness of breath, cough or hemoptysis. She has no weight loss or night sweats. She denied having any peripheral neuropathy. She is here today for evaluation before proceeding with cycle #16.  MEDICAL HISTORY: Past Medical History:  Diagnosis Date  . Bruises easily    d/t being on COumadin  . Cholelithiasis   . DDD (degenerative disc disease), lumbar   . Fatty liver   . GERD (gastroesophageal reflux disease)    takes Pantoprazole daily  . Glaucoma    uses eye drops as instructed  . H/O stem cell transplant ([place])    04-26-2006  AT [facility]  . Herpes    takes Valtrex as needed  . History of colon polyps   . Hypertension    takes Amlodipine and Diovan daily  . Lung cancer ([place])    chest wall lesion aadn 6th rib  . Multiple myeloma DX  2006  STATE IIA,  IgA  KAPPA   STABLE  PER DR Maree Ainley--  S/P STEM CELL TRANSPLANT 2007 /  RECURRENCE 2008  CHEMO ENDED 03-30-2008/   NOW ON MAINTANANCE REVLIMID )  . 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 Personal history of colon cancer    ASCENDING COLON--  S/P  RIGHT HEMICOLECTOMY WITH NEGATIVE NODES/    NO RECURRENCE  . Type 2 diabetes mellitus ([place])   . Vocal cord nodule    resolved- see Dr. Deeann Saint notes in media tab 06/17/14    ALLERGIES:  is allergic to codeine and tramadol.  MEDICATIONS:  Current Outpatient Prescriptions  Medication Sig Dispense Refill  . acetaminophen (TYLENOL) 500 MG tablet Take 500 mg by mouth daily as needed for moderate pain. Reported on  06/25/2016    . aspirin (ASPIRIN LOW DOSE) 81 MG EC tablet Take 81 mg by mouth daily.      . calcium-vitamin D (OSCAL 500/200 D-3) 500-200 MG-UNIT per tablet Take 1 tablet by mouth 3 (three) times daily.      . cholecalciferol (VITAMIN D) 1000 UNITS tablet Take 1,000 Units by mouth daily. Reported on 04/24/2016    . dexamethasone (DECADRON) 4 MG tablet Take 10 tabs (40 mg) once weekly while undergoing Velcade injections. 40 tablet 2  . dorzolamide-timolol (COSOPT) 22.3-6.8 MG/ML ophthalmic solution Place 1 drop into the left eye 2 (two) times daily.     Marland Kitchen gabapentin (NEURONTIN) 100 MG capsule Take 1 capsule by mouth 3  times daily 270 capsule 1  . loperamide (IMODIUM A-D) 2 MG tablet Take 2 mg by mouth daily as needed for diarrhea or loose stools. Reported on 07/09/2016    . loratadine (CLARITIN) 10 MG tablet Take 1 tablet (10 mg total) by mouth daily. 30 tablet 5  . LUMIGAN 0.01 % SOLN Place 1 drop into both eyes at bedtime.     . Multiple Vitamins-Minerals (CENTRUM SILVER PO) Take 1 tablet by mouth daily.     . ondansetron (ZOFRAN) 4 MG tablet Take 1 tablet (4 mg total) by mouth every 6 (six) hours. 10 tablet 0  . oxyCODONE-acetaminophen (PERCOCET/ROXICET) 5-325 MG tablet Take 1 tablet by mouth every 8 (eight) hours as needed for severe pain. 40 tablet 0  . pantoprazole (PROTONIX) 40 MG tablet TAKE 1 TABLET BY MOUTH  DAILY FOR ACID REFLUX 90 tablet 1  . potassium chloride SA (K-DUR,KLOR-CON) 20 MEQ tablet Take 1 tablet (20 mEq total) by mouth 2 (two) times daily. 180 tablet 1  . PREMARIN vaginal cream PLACE 1 APPLICATORFUL VAGINALLY TWICE A WEEK. 30 g 0  . temazepam (RESTORIL) 15 MG capsule TAKE 1 CAPSULE AT BEDTIME AS NEEDED FOR SLEEP. 30 capsule 2  . valACYclovir (VALTREX) 1000 MG tablet Take 1 tablet by mouth  daily as needed 90 tablet 0  . valsartan-hydrochlorothiazide (DIOVAN-HCT) 320-25 MG tablet Take 1 tablet by mouth  daily 90 tablet 1   No current facility-administered medications for  this visit.     SURGICAL HISTORY:  Past Surgical History:  Procedure Laterality Date  . ABDOMINAL HYSTERECTOMY  1983   w/  appendectomy and left salpingoophorectom  . CATARACT EXTRACTION W/PHACO Left 01/20/2014   Procedure: CATARACT EXTRACTION PHACO AND INTRAOCULAR LENS PLACEMENT (IOC);  Surgeon: Adonis Brook, MD;  Location: [facility];  Service: Ophthalmology;  Laterality: Left;  . CERVICAL CONE BIOPSY    . CHOLECYSTECTOMY N/A 02/28/2016   Procedure: LAPAROSCOPIC CHOLECYSTECTOMY;  Surgeon: Ralene Ok, MD;  Location: [facility];  Service: General;  Laterality: N/A;  . CO2  ABLATION VULVA PERIANAL AND VAGINAL DYSPLATIC AREAS  02-15-2010  . COLONOSCOPY  10/07/2006   [id number] rectum/Status post right hemicolectomy. Normal residual colon  . COLONOSCOPY  10/2009   RMR: diminutive RS polyp (hyperplastic).   . COLONOSCOPY N/A 12/15/2014   Procedure: COLONOSCOPY;  Surgeon: Daneil Dolin, MD;  [facility];  Service: Endoscopy;  Laterality: N/A;  130   . ESOPHAGOGASTRODUODENOSCOPY  04/02/2005   [id number] polyp with central erosion versus ulceration in the body of the stomach, resected and recovered Remainder of the gastric mucosa, D1, D2 appeared normal  . HEMICOLECTOMY Right 04-27-2005   CARCINOMA   ASCENDING COLON  . LESION REMOVAL N/A 05/06/2014   Procedure: LASER OF THE VAGINA;  Surgeon: Janie Morning, MD;  [facility];  Service: Gynecology;  Laterality: N/A;  . LIMBAL STEM CELL TRANSPLANT  04-26-2006    ([facility])  . VULVAR LESION REMOVAL N/A 05/06/2014   Procedure: LASER OF VULVAR LESION;  Surgeon: Janie Morning, MD;  Location: [facility];  Service: Gynecology;  Laterality: N/A;  . VULVECTOMY N/A 05/06/2014   Procedure: WIDE LOCAL EXCISION LEFT LABIA MAJORA;  Surgeon: Janie Morning, MD;  Location: [facility];  Service: Gynecology;  Laterality: N/A;    REVIEW OF SYSTEMS:  A comprehensive review of systems was  negative except for: Musculoskeletal: positive for back pain   PHYSICAL EXAMINATION: General appearance: alert, cooperative and no distress Head: Normocephalic, without obvious abnormality, atraumatic Neck: no adenopathy Lymph nodes: Cervical, supraclavicular, and axillary nodes normal. Resp: clear to auscultation bilaterally Back: symmetric, no curvature. ROM normal. No CVA tenderness. Cardio: regular rate and rhythm, S1, S2 normal, no murmur, click, rub or gallop GI: soft, non-tender; bowel sounds normal; no masses,  no organomegaly Extremities: extremities normal, atraumatic, no cyanosis or edema Neurologic: Alert and oriented X 3, normal strength and tone. Normal symmetric reflexes. Normal coordination and gait  ECOG PERFORMANCE STATUS: 1 - Symptomatic but completely ambulatory  Blood pressure 136/74, pulse 97, temperature 98 F (36.7 C), temperature source Oral, resp. rate 18, height '5\' 6"'  (1.676 m), weight 188 lb 3.2 oz (85.4 kg), SpO2 98 %.  LABORATORY DATA: Lab Results  Component Value Date   WBC 12.0 (H) 10/08/2016   HGB 10.5 (L) 10/08/2016   HCT 33.6 (L) 10/08/2016   MCV 100.5 10/08/2016   PLT 137 (L) 10/08/2016      Chemistry      Component Value Date/Time   NA 141 10/01/2016 0928   K 4.0 10/01/2016 0928   CL 110 08/23/2016 1000   CL 105 06/17/2013 1441   CO2 26 10/01/2016 0928   BUN 17.4 10/01/2016 0928   CREATININE 0.8 10/01/2016 0928      Component Value Date/Time   CALCIUM 9.2 10/01/2016 0928   ALKPHOS 83 10/01/2016 0928   AST 81 (H) 10/01/2016 0928   ALT 56 (H) 10/01/2016 0928   BILITOT 0.87 10/01/2016 0928       RADIOGRAPHIC STUDIES: No results found.  ASSESSMENT AND PLAN: This is a very pleasant 65 years old [place] American female with history of multiple myeloma currently on treatment with Revlimid and low-dose Decadron and tolerated her treatment fairly well. The patient is doing fine today with no specific complaints except for mild fatigue.    Because of disease progression, I recommended for her to continue her treatment with Revlimid and Decadron but I increased the dose of Revlimid to 25 mg by mouth daily for 21 days every 4 weeks, status post 12 months of this regimen. The recent myeloma panel performed at [facility] showed evidence for disease progression. The patient also has enlarging right-sided chest  wall lesion. She underwent palliative radiotherapy to the enlarging right-posterior seventh rib lesion. She was treated with 1 cycle of chemotherapy with Daratumumab, Velcade and Decadron but this was discontinued secondary to intolerance and pancytopenia. She is currently undergoing treatment with weekly subcutaneous Velcade in addition to weekly Decadron. Status post 15 cycles. She is tolerating her treatment well. I recommended for the patient to proceed with cycle #16 today as a scheduled. I recommended for the patient to come back for follow-up visit in 3 weeks with repeating myeloma panel for reevaluation of her disease. The patient will continue her treatment with Zometa every 3 months as a scheduled. For the low back pain, I gave the patient refill of Percocet. I will continue to monitor this closely and consider her for MRI of the back with no improvement. She was advised to call immediately if she has any concerning symptoms and interval. The patient voices understanding of current disease status and treatment options and is in agreement with the current care plan.  All questions were answered. The patient knows to call the clinic with any problems, questions or concerns. We can certainly see the patient much sooner if necessary.  Disclaimer: This note was dictated with voice recognition software. Similar sounding words can inadvertently be transcribed and may not be corrected upon review.

## 2016-10-08 NOTE — Telephone Encounter (Signed)
Gave patient avs report and appointments for October and November.  °

## 2016-10-15 ENCOUNTER — Ambulatory Visit (HOSPITAL_BASED_OUTPATIENT_CLINIC_OR_DEPARTMENT_OTHER): Payer: Medicare Other

## 2016-10-15 ENCOUNTER — Other Ambulatory Visit (HOSPITAL_BASED_OUTPATIENT_CLINIC_OR_DEPARTMENT_OTHER): Payer: Medicare Other

## 2016-10-15 VITALS — BP 116/52 | HR 99 | Temp 98.1°F | Resp 17

## 2016-10-15 DIAGNOSIS — C9002 Multiple myeloma in relapse: Secondary | ICD-10-CM

## 2016-10-15 DIAGNOSIS — C9000 Multiple myeloma not having achieved remission: Secondary | ICD-10-CM

## 2016-10-15 DIAGNOSIS — Z5112 Encounter for antineoplastic immunotherapy: Secondary | ICD-10-CM

## 2016-10-15 LAB — CBC WITH DIFFERENTIAL/PLATELET
BASO%: 0.3 % (ref 0.0–2.0)
BASOS ABS: 0 10*3/uL (ref 0.0–0.1)
EOS ABS: 0.1 10*3/uL (ref 0.0–0.5)
EOS%: 0.4 % (ref 0.0–7.0)
HEMATOCRIT: 33.1 % — AB (ref 34.8–46.6)
HEMOGLOBIN: 10.6 g/dL — AB (ref 11.6–15.9)
LYMPH%: 5 % — ABNORMAL LOW (ref 14.0–49.7)
MCH: 32.4 pg (ref 25.1–34.0)
MCHC: 32 g/dL (ref 31.5–36.0)
MCV: 101.2 fL — AB (ref 79.5–101.0)
MONO#: 1.3 10*3/uL — AB (ref 0.1–0.9)
MONO%: 10.3 % (ref 0.0–14.0)
NEUT%: 84 % — ABNORMAL HIGH (ref 38.4–76.8)
NEUTROS ABS: 10.6 10*3/uL — AB (ref 1.5–6.5)
PLATELETS: 130 10*3/uL — AB (ref 145–400)
RBC: 3.27 10*6/uL — ABNORMAL LOW (ref 3.70–5.45)
RDW: 17.5 % — AB (ref 11.2–14.5)
WBC: 12.7 10*3/uL — AB (ref 3.9–10.3)
lymph#: 0.6 10*3/uL — ABNORMAL LOW (ref 0.9–3.3)

## 2016-10-15 LAB — COMPREHENSIVE METABOLIC PANEL
ALBUMIN: 3.1 g/dL — AB (ref 3.5–5.0)
ALK PHOS: 85 U/L (ref 40–150)
ALT: 45 U/L (ref 0–55)
ANION GAP: 10 meq/L (ref 3–11)
AST: 37 U/L — ABNORMAL HIGH (ref 5–34)
BILIRUBIN TOTAL: 1.03 mg/dL (ref 0.20–1.20)
BUN: 13.9 mg/dL (ref 7.0–26.0)
CALCIUM: 9.5 mg/dL (ref 8.4–10.4)
CO2: 27 mEq/L (ref 22–29)
Chloride: 105 mEq/L (ref 98–109)
Creatinine: 0.9 mg/dL (ref 0.6–1.1)
EGFR: 79 mL/min/{1.73_m2} — AB (ref >90)
GLUCOSE: 136 mg/dL (ref 70–140)
POTASSIUM: 4.4 meq/L (ref 3.5–5.1)
SODIUM: 142 meq/L (ref 136–145)
TOTAL PROTEIN: 6.6 g/dL (ref 6.4–8.3)

## 2016-10-15 MED ORDER — PROCHLORPERAZINE MALEATE 10 MG PO TABS
10.0000 mg | ORAL_TABLET | Freq: Once | ORAL | Status: AC
  Administered 2016-10-15: 10 mg via ORAL

## 2016-10-15 MED ORDER — ZOLEDRONIC ACID 4 MG/100ML IV SOLN
4.0000 mg | Freq: Once | INTRAVENOUS | Status: AC
  Administered 2016-10-15: 4 mg via INTRAVENOUS
  Filled 2016-10-15: qty 100

## 2016-10-15 MED ORDER — BORTEZOMIB CHEMO SQ INJECTION 3.5 MG (2.5MG/ML)
1.3000 mg/m2 | Freq: Once | INTRAMUSCULAR | Status: AC
Start: 2016-10-15 — End: 2016-10-15
  Administered 2016-10-15: 2.5 mg via SUBCUTANEOUS
  Filled 2016-10-15: qty 2.5

## 2016-10-15 MED ORDER — SODIUM CHLORIDE 0.9 % IV SOLN
Freq: Once | INTRAVENOUS | Status: AC
  Administered 2016-10-15: 12:00:00 via INTRAVENOUS

## 2016-10-15 MED ORDER — HEPARIN SOD (PORK) LOCK FLUSH 100 UNIT/ML IV SOLN
500.0000 [IU] | Freq: Once | INTRAVENOUS | Status: DC | PRN
  Filled 2016-10-15: qty 5

## 2016-10-15 MED ORDER — PROCHLORPERAZINE MALEATE 10 MG PO TABS
ORAL_TABLET | ORAL | Status: AC
  Filled 2016-10-15: qty 1

## 2016-10-15 MED ORDER — SODIUM CHLORIDE 0.9 % IJ SOLN
10.0000 mL | INTRAMUSCULAR | Status: DC | PRN
  Filled 2016-10-15: qty 10

## 2016-10-15 NOTE — Patient Instructions (Signed)
[facility] Discharge Instructions for Patients Receiving Chemotherapy  Today you received the following chemotherapy agents Velcade.  You also had your Zometa today. To help prevent nausea and vomiting after your treatment, we encourage you to take your nausea medication as directed.  If you develop nausea and vomiting that is not controlled by your nausea medication, call the clinic.   BELOW ARE SYMPTOMS THAT SHOULD BE REPORTED IMMEDIATELY:  *FEVER GREATER THAN 100.5 F  *CHILLS WITH OR WITHOUT FEVER  NAUSEA AND VOMITING THAT IS NOT CONTROLLED WITH YOUR NAUSEA MEDICATION  *UNUSUAL SHORTNESS OF BREATH  *UNUSUAL BRUISING OR BLEEDING  TENDERNESS IN MOUTH AND THROAT WITH OR WITHOUT PRESENCE OF ULCERS  *URINARY PROBLEMS  *BOWEL PROBLEMS  UNUSUAL RASH Items with * indicate a potential emergency and should be followed up as soon as possible.  Feel free to call the clinic you have any questions or concerns. The clinic phone number is [phone].  Please show the [place] at check-in to the Emergency Department and triage nurse.

## 2016-10-17 ENCOUNTER — Other Ambulatory Visit: Payer: Self-pay | Admitting: Family Medicine

## 2016-10-22 ENCOUNTER — Ambulatory Visit (HOSPITAL_BASED_OUTPATIENT_CLINIC_OR_DEPARTMENT_OTHER): Payer: Medicare Other

## 2016-10-22 ENCOUNTER — Other Ambulatory Visit (HOSPITAL_BASED_OUTPATIENT_CLINIC_OR_DEPARTMENT_OTHER): Payer: Medicare Other

## 2016-10-22 VITALS — BP 112/54 | HR 98 | Temp 98.2°F | Resp 19

## 2016-10-22 DIAGNOSIS — Z5112 Encounter for antineoplastic immunotherapy: Secondary | ICD-10-CM

## 2016-10-22 DIAGNOSIS — C9000 Multiple myeloma not having achieved remission: Secondary | ICD-10-CM

## 2016-10-22 DIAGNOSIS — C9002 Multiple myeloma in relapse: Secondary | ICD-10-CM

## 2016-10-22 LAB — COMPREHENSIVE METABOLIC PANEL
ALBUMIN: 3.1 g/dL — AB (ref 3.5–5.0)
ALK PHOS: 81 U/L (ref 40–150)
ALT: 35 U/L (ref 0–55)
ANION GAP: 9 meq/L (ref 3–11)
AST: 42 U/L — ABNORMAL HIGH (ref 5–34)
BILIRUBIN TOTAL: 0.77 mg/dL (ref 0.20–1.20)
BUN: 15.1 mg/dL (ref 7.0–26.0)
CALCIUM: 9.3 mg/dL (ref 8.4–10.4)
CHLORIDE: 105 meq/L (ref 98–109)
CO2: 28 mEq/L (ref 22–29)
CREATININE: 0.9 mg/dL (ref 0.6–1.1)
EGFR: 82 mL/min/{1.73_m2} — ABNORMAL LOW (ref >90)
Glucose: 121 mg/dl (ref 70–140)
Potassium: 4.1 mEq/L (ref 3.5–5.1)
Sodium: 141 mEq/L (ref 136–145)
Total Protein: 6.4 g/dL (ref 6.4–8.3)

## 2016-10-22 LAB — CBC WITH DIFFERENTIAL/PLATELET
BASO%: 0.1 % (ref 0.0–2.0)
BASOS ABS: 0 10*3/uL (ref 0.0–0.1)
EOS%: 0.4 % (ref 0.0–7.0)
Eosinophils Absolute: 0.1 10*3/uL (ref 0.0–0.5)
HEMATOCRIT: 33 % — AB (ref 34.8–46.6)
HEMOGLOBIN: 10.6 g/dL — AB (ref 11.6–15.9)
LYMPH#: 0.9 10*3/uL (ref 0.9–3.3)
LYMPH%: 6.7 % — ABNORMAL LOW (ref 14.0–49.7)
MCH: 32.2 pg (ref 25.1–34.0)
MCHC: 32.1 g/dL (ref 31.5–36.0)
MCV: 100.3 fL (ref 79.5–101.0)
MONO#: 1.2 10*3/uL — AB (ref 0.1–0.9)
MONO%: 8.7 % (ref 0.0–14.0)
NEUT#: 11.7 10*3/uL — ABNORMAL HIGH (ref 1.5–6.5)
NEUT%: 84.1 % — AB (ref 38.4–76.8)
PLATELETS: 116 10*3/uL — AB (ref 145–400)
RBC: 3.29 10*6/uL — ABNORMAL LOW (ref 3.70–5.45)
RDW: 15.4 % — ABNORMAL HIGH (ref 11.2–14.5)
WBC: 13.9 10*3/uL — ABNORMAL HIGH (ref 3.9–10.3)

## 2016-10-22 LAB — TECHNOLOGIST REVIEW

## 2016-10-22 MED ORDER — PROCHLORPERAZINE MALEATE 10 MG PO TABS
10.0000 mg | ORAL_TABLET | Freq: Once | ORAL | Status: AC
  Administered 2016-10-22: 10 mg via ORAL

## 2016-10-22 MED ORDER — BORTEZOMIB CHEMO SQ INJECTION 3.5 MG (2.5MG/ML)
1.3000 mg/m2 | Freq: Once | INTRAMUSCULAR | Status: AC
  Administered 2016-10-22: 2.5 mg via SUBCUTANEOUS
  Filled 2016-10-22: qty 2.5

## 2016-10-22 MED ORDER — PROCHLORPERAZINE MALEATE 10 MG PO TABS
ORAL_TABLET | ORAL | Status: AC
  Filled 2016-10-22: qty 1

## 2016-10-22 NOTE — Patient Instructions (Signed)
[facility] Discharge Instructions for Patients Receiving Chemotherapy  Today you received the following chemotherapy agents Velcade. To help prevent nausea and vomiting after your treatment, we encourage you to take your nausea medication as directed.  If you develop nausea and vomiting that is not controlled by your nausea medication, call the clinic.   BELOW ARE SYMPTOMS THAT SHOULD BE REPORTED IMMEDIATELY:  *FEVER GREATER THAN 100.5 F  *CHILLS WITH OR WITHOUT FEVER  NAUSEA AND VOMITING THAT IS NOT CONTROLLED WITH YOUR NAUSEA MEDICATION  *UNUSUAL SHORTNESS OF BREATH  *UNUSUAL BRUISING OR BLEEDING  TENDERNESS IN MOUTH AND THROAT WITH OR WITHOUT PRESENCE OF ULCERS  *URINARY PROBLEMS  *BOWEL PROBLEMS  UNUSUAL RASH Items with * indicate a potential emergency and should be followed up as soon as possible.  Feel free to call the clinic you have any questions or concerns. The clinic phone number is [phone].  Please show the CHEMO ALERT CARD at check-in to the Emergency Department and triage nurse.    

## 2016-10-24 ENCOUNTER — Other Ambulatory Visit: Payer: Self-pay | Admitting: Family Medicine

## 2016-10-29 ENCOUNTER — Ambulatory Visit (HOSPITAL_BASED_OUTPATIENT_CLINIC_OR_DEPARTMENT_OTHER): Payer: Medicare Other | Admitting: Internal Medicine

## 2016-10-29 ENCOUNTER — Telehealth: Payer: Self-pay | Admitting: Internal Medicine

## 2016-10-29 ENCOUNTER — Other Ambulatory Visit (HOSPITAL_BASED_OUTPATIENT_CLINIC_OR_DEPARTMENT_OTHER): Payer: Medicare Other

## 2016-10-29 ENCOUNTER — Encounter: Payer: Self-pay | Admitting: Internal Medicine

## 2016-10-29 ENCOUNTER — Ambulatory Visit (HOSPITAL_BASED_OUTPATIENT_CLINIC_OR_DEPARTMENT_OTHER): Payer: Medicare Other

## 2016-10-29 VITALS — BP 149/77 | HR 100 | Temp 98.3°F | Resp 17 | Ht 66.0 in | Wt 190.8 lb

## 2016-10-29 DIAGNOSIS — C9000 Multiple myeloma not having achieved remission: Secondary | ICD-10-CM | POA: Diagnosis not present

## 2016-10-29 DIAGNOSIS — C9002 Multiple myeloma in relapse: Secondary | ICD-10-CM

## 2016-10-29 DIAGNOSIS — M545 Low back pain: Secondary | ICD-10-CM

## 2016-10-29 DIAGNOSIS — Z5112 Encounter for antineoplastic immunotherapy: Secondary | ICD-10-CM | POA: Diagnosis present

## 2016-10-29 DIAGNOSIS — Z5111 Encounter for antineoplastic chemotherapy: Secondary | ICD-10-CM

## 2016-10-29 LAB — COMPREHENSIVE METABOLIC PANEL
ALBUMIN: 3 g/dL — AB (ref 3.5–5.0)
ALT: 15 U/L (ref 0–55)
ANION GAP: 9 meq/L (ref 3–11)
AST: 15 U/L (ref 5–34)
Alkaline Phosphatase: 70 U/L (ref 40–150)
BUN: 18.1 mg/dL (ref 7.0–26.0)
CALCIUM: 9.5 mg/dL (ref 8.4–10.4)
CO2: 27 mEq/L (ref 22–29)
Chloride: 107 mEq/L (ref 98–109)
Creatinine: 0.9 mg/dL (ref 0.6–1.1)
EGFR: 75 mL/min/{1.73_m2} — AB (ref >90)
Glucose: 116 mg/dl (ref 70–140)
Potassium: 4.3 mEq/L (ref 3.5–5.1)
Sodium: 142 mEq/L (ref 136–145)
TOTAL PROTEIN: 6.2 g/dL — AB (ref 6.4–8.3)
Total Bilirubin: 0.78 mg/dL (ref 0.20–1.20)

## 2016-10-29 LAB — CBC WITH DIFFERENTIAL/PLATELET
BASO%: 0.3 % (ref 0.0–2.0)
Basophils Absolute: 0 10*3/uL (ref 0.0–0.1)
EOS ABS: 0.1 10*3/uL (ref 0.0–0.5)
EOS%: 0.6 % (ref 0.0–7.0)
HEMATOCRIT: 33.5 % — AB (ref 34.8–46.6)
HGB: 10.6 g/dL — ABNORMAL LOW (ref 11.6–15.9)
LYMPH#: 0.7 10*3/uL — AB (ref 0.9–3.3)
LYMPH%: 6.1 % — ABNORMAL LOW (ref 14.0–49.7)
MCH: 31.6 pg (ref 25.1–34.0)
MCHC: 31.6 g/dL (ref 31.5–36.0)
MCV: 100 fL (ref 79.5–101.0)
MONO#: 1.3 10*3/uL — AB (ref 0.1–0.9)
MONO%: 10.2 % (ref 0.0–14.0)
NEUT%: 82.8 % — ABNORMAL HIGH (ref 38.4–76.8)
NEUTROS ABS: 10.2 10*3/uL — AB (ref 1.5–6.5)
PLATELETS: 123 10*3/uL — AB (ref 145–400)
RBC: 3.35 10*6/uL — ABNORMAL LOW (ref 3.70–5.45)
RDW: 16.3 % — ABNORMAL HIGH (ref 11.2–14.5)
WBC: 12.3 10*3/uL — AB (ref 3.9–10.3)

## 2016-10-29 MED ORDER — PROCHLORPERAZINE MALEATE 10 MG PO TABS
10.0000 mg | ORAL_TABLET | Freq: Once | ORAL | Status: AC
  Administered 2016-10-29: 10 mg via ORAL

## 2016-10-29 MED ORDER — PROCHLORPERAZINE MALEATE 10 MG PO TABS
ORAL_TABLET | ORAL | Status: AC
  Filled 2016-10-29: qty 1

## 2016-10-29 MED ORDER — BORTEZOMIB CHEMO SQ INJECTION 3.5 MG (2.5MG/ML)
1.3000 mg/m2 | Freq: Once | INTRAMUSCULAR | Status: AC
  Administered 2016-10-29: 2.5 mg via SUBCUTANEOUS
  Filled 2016-10-29: qty 2.5

## 2016-10-29 NOTE — Progress Notes (Signed)
[place] [phone]   [phone]  OFFICE PROGRESS NOTE  [name], MD [address], [address] [place] [address]  DIAGNOSIS: Stage IIA IgA kappa multiple myeloma diagnosed in 2006   PRIOR THERAPY:  1) status post treatment with thalidomide and Decadron followed by autologous peripheral blood stem cell transplant with high-dose melphalan on 04/26/2006 at [facility].  2) the patient had evidence for disease recurrence in August of 2008 and she was treated with 6 cycles of Velcade completed on 03/30/2008 with response to the treatment but was discontinued secondary to neuropathy.  3) maintenance treatment with Revlimid 10 mg by mouth daily by the does was later increase to 15 mg by mouth daily secondary to biochemical recurrence with stabilization of her disease. The treatment with Revlimid 15 mg by mouth daily was discontinued secondary to evidence for disease progression. 4) Revlimid 25 mg by mouth daily for 3 weeks every 4 weeks in addition to Decadron currently at 20 mg by mouth on a weekly basis. She is status post 12 cycles. This was discontinued secondary to disease progression. 5) systemic chemotherapy with Daratumumab 16 MG/KG weekly for the first 9 weeks, Velcade 1.3 MG/M2 subcutaneously on days 1, 4, 8 and 11 every 3 weeks in addition to Decadron 20 mg by mouth on days 1, 2, 4, 5, 8, 9, 11 and 12 every 3 weeks. First dose 05/07/2016. Status post one cycle discontinued secondary to intolerance.   CURRENT THERAPY:  1) systemic chemotherapy with Velcade 1.3 MG/M2 subcutaneously weekly in addition to Decadron 40 mg by mouth daily. Status post 18 weekly cycles. 2) Zometa 4 mg IV every 3 months. First dose in 05/05/2015  ADVANCED DIRECTIVE: She does not have advanced directive and was given information.  INTERVAL HISTORY: Darlene Howard 65 y.o. female returns to the clinic today for routine monthly followup visit accompanied  by her brother and sister-in-law. The patient is doing fine today with no specific complaints. She continues to tolerate her treatment with weekly subcutaneous Velcade and Decadron fairly well. She status post 18 cycles. The patient denied having any fever or chills. She denied having any shortness of breath, cough or hemoptysis. She has no weight loss or night sweats. She denied having any peripheral neuropathy. She is here today for evaluation before proceeding with cycle #19.  MEDICAL HISTORY: Past Medical History:  Diagnosis Date  . Bruises easily    d/t being on COumadin  . Cholelithiasis   . DDD (degenerative disc disease), lumbar   . Fatty liver   . GERD (gastroesophageal reflux disease)    takes Pantoprazole daily  . Glaucoma    uses eye drops as instructed  . H/O stem cell transplant ([place])    04-26-2006  AT [facility]  . Herpes    takes Valtrex as needed  . History of colon polyps   . Hypertension    takes Amlodipine and Diovan daily  . Lung cancer ([place])    chest wall lesion aadn 6th rib  . Multiple myeloma DX  2006  STATE IIA,  IgA  KAPPA   STABLE  PER DR Milca Sytsma--  S/P STEM CELL TRANSPLANT 2007 /  RECURRENCE 2008  CHEMO ENDED 03-30-2008/   NOW ON MAINTANANCE REVLIMID )  . 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 Personal history of colon cancer    ASCENDING COLON--  S/P  RIGHT HEMICOLECTOMY WITH  NEGATIVE NODES/    NO RECURRENCE  . Type 2 diabetes mellitus ([place])   . Vocal cord nodule    resolved- see Dr. Deeann Saint notes in media tab 06/17/14    ALLERGIES:  is allergic to codeine and tramadol.  MEDICATIONS:  Current Outpatient Prescriptions  Medication Sig Dispense Refill  . aspirin (ASPIRIN LOW DOSE) 81 MG EC tablet Take 81 mg by mouth daily.      . calcium-vitamin D (OSCAL 500/200 D-3) 500-200 MG-UNIT per tablet Take 1 tablet by mouth 3 (three) times daily.      . cholecalciferol (VITAMIN D) 1000 UNITS tablet Take  1,000 Units by mouth daily. Reported on 04/24/2016    . dexamethasone (DECADRON) 4 MG tablet Take 10 tabs (40 mg) once weekly while undergoing Velcade injections. 40 tablet 2  . dorzolamide-timolol (COSOPT) 22.3-6.8 MG/ML ophthalmic solution Place 1 drop into the left eye 2 (two) times daily.     Marland Kitchen gabapentin (NEURONTIN) 100 MG capsule Take 1 capsule by mouth 3  times daily 270 capsule 1  . loperamide (IMODIUM A-D) 2 MG tablet Take 2 mg by mouth daily as needed for diarrhea or loose stools. Reported on 07/09/2016    . loratadine (CLARITIN) 10 MG tablet Take 1 tablet (10 mg total) by mouth daily. 30 tablet 5  . LUMIGAN 0.01 % SOLN Place 1 drop into both eyes at bedtime.     . Multiple Vitamins-Minerals (CENTRUM SILVER PO) Take 1 tablet by mouth daily.     . ondansetron (ZOFRAN) 4 MG tablet Take 1 tablet (4 mg total) by mouth every 6 (six) hours. 10 tablet 0  . oxyCODONE-acetaminophen (PERCOCET/ROXICET) 5-325 MG tablet Take 1 tablet by mouth every 8 (eight) hours as needed for severe pain. 40 tablet 0  . pantoprazole (PROTONIX) 40 MG tablet TAKE 1 TABLET BY MOUTH  DAILY FOR ACID REFLUX 90 tablet 1  . potassium chloride SA (K-DUR,KLOR-CON) 20 MEQ tablet TAKE 1 TABLET BY MOUTH  TWICE A DAY 180 tablet 1  . PREMARIN vaginal cream PLACE 1 APPLICATORFUL VAGINALLY TWICE A WEEK. 30 g 0  . temazepam (RESTORIL) 15 MG capsule TAKE 1 CAPSULE AT BEDTIME AS NEEDED FOR SLEEP. 30 capsule 2  . valACYclovir (VALTREX) 1000 MG tablet TAKE 1 TABLET BY MOUTH  DAILY AS NEEDED 90 tablet 0  . valsartan-hydrochlorothiazide (DIOVAN-HCT) 320-25 MG tablet Take 1 tablet by mouth  daily 90 tablet 1  . acetaminophen (TYLENOL) 500 MG tablet Take 500 mg by mouth daily as needed for moderate pain. Reported on 06/25/2016     No current facility-administered medications for this visit.     SURGICAL HISTORY:  Past Surgical History:  Procedure Laterality Date  . ABDOMINAL HYSTERECTOMY  1983   w/  appendectomy and left  salpingoophorectom  . CATARACT EXTRACTION W/PHACO Left 01/20/2014   Procedure: CATARACT EXTRACTION PHACO AND INTRAOCULAR LENS PLACEMENT (IOC);  Surgeon: Adonis Brook, MD;  Location: [place];  Service: Ophthalmology;  Laterality: Left;  . CERVICAL CONE BIOPSY    . CHOLECYSTECTOMY N/A 02/28/2016   Procedure: LAPAROSCOPIC CHOLECYSTECTOMY;  Surgeon: Ralene Ok, MD;  Location: [place] [facility];  Service: General;  Laterality: N/A;  . CO2  ABLATION VULVA PERIANAL AND VAGINAL DYSPLATIC AREAS  02-15-2010  . COLONOSCOPY  10/07/2006   DXA:JOINOM rectum/Status post right hemicolectomy. Normal residual colon  . COLONOSCOPY  10/2009   RMR: diminutive RS polyp (hyperplastic).   . COLONOSCOPY N/A 12/15/2014   Procedure: COLONOSCOPY;  Surgeon: Daneil Dolin, MD;  [facility];  Service: Endoscopy;  Laterality: N/A;  130   . ESOPHAGOGASTRODUODENOSCOPY  04/02/2005   KDX:IPJASN/[id number] polyp with central erosion versus ulceration in the body of the stomach, resected and recovered Remainder of the gastric mucosa, D1, D2 appeared normal  . HEMICOLECTOMY Right 04-27-2005   CARCINOMA   ASCENDING COLON  . LESION REMOVAL N/A 05/06/2014   Procedure: LASER OF THE VAGINA;  Surgeon: Janie Morning, MD;  Location: [facility];  Service: Gynecology;  Laterality: N/A;  . LIMBAL STEM CELL TRANSPLANT  04-26-2006    ([facility])  . VULVAR LESION REMOVAL N/A 05/06/2014   Procedure: LASER OF VULVAR LESION;  Surgeon: Janie Morning, MD;  Location: [facility];  Service: Gynecology;  Laterality: N/A;  . VULVECTOMY N/A 05/06/2014   Procedure: WIDE LOCAL EXCISION LEFT LABIA MAJORA;  Surgeon: Janie Morning, MD;  Location: [place];  Service: Gynecology;  Laterality: N/A;    REVIEW OF SYSTEMS:  A comprehensive review of systems was negative except for: Constitutional: positive for fatigue   PHYSICAL EXAMINATION: General appearance: alert, cooperative and no distress Head:  Normocephalic, without obvious abnormality, atraumatic Neck: no adenopathy Lymph nodes: Cervical, supraclavicular, and axillary nodes normal. Resp: clear to auscultation bilaterally Back: symmetric, no curvature. ROM normal. No CVA tenderness. Cardio: regular rate and rhythm, S1, S2 normal, no murmur, click, rub or gallop GI: soft, non-tender; bowel sounds normal; no masses,  no organomegaly Extremities: extremities normal, atraumatic, no cyanosis or edema Neurologic: Alert and oriented X 3, normal strength and tone. Normal symmetric reflexes. Normal coordination and gait  ECOG PERFORMANCE STATUS: 1 - Symptomatic but completely ambulatory  Blood pressure (!) 149/77, pulse 100, temperature 98.3 F (36.8 C), temperature source Oral, resp. rate 17, height '5\' 6"'  (1.676 m), weight 190 lb 12.8 oz (86.5 kg), SpO2 98 %.  LABORATORY DATA: Lab Results  Component Value Date   WBC 12.3 (H) 10/29/2016   HGB 10.6 (L) 10/29/2016   HCT 33.5 (L) 10/29/2016   MCV 100.0 10/29/2016   PLT 123 (L) 10/29/2016      Chemistry      Component Value Date/Time   NA 142 10/29/2016 1004   K 4.3 10/29/2016 1004   CL 110 08/23/2016 1000   CL 105 06/17/2013 1441   CO2 27 10/29/2016 1004   BUN 18.1 10/29/2016 1004   CREATININE 0.9 10/29/2016 1004      Component Value Date/Time   CALCIUM 9.5 10/29/2016 1004   ALKPHOS 70 10/29/2016 1004   AST 15 10/29/2016 1004   ALT 15 10/29/2016 1004   BILITOT 0.78 10/29/2016 1004       RADIOGRAPHIC STUDIES: No results found.  ASSESSMENT AND PLAN: This is a very pleasant 65 years old [place] American female with history of multiple myeloma currently on treatment with Revlimid and low-dose Decadron and tolerated her treatment fairly well. The patient is doing fine today with no specific complaints except for mild fatigue.  Because of disease progression, I recommended for her to continue her treatment with Revlimid and Decadron but I increased the dose of Revlimid to  25 mg by mouth daily for 21 days every 4 weeks, status post 12 months of this regimen. The recent myeloma panel performed at [facility] showed evidence for disease progression. The patient also has enlarging right-sided chest wall lesion. She underwent palliative radiotherapy to the enlarging right-posterior seventh rib lesion. She was treated with 1 cycle of chemotherapy with Daratumumab, Velcade and Decadron but this was discontinued  secondary to intolerance and pancytopenia. She is currently undergoing treatment with weekly subcutaneous Velcade in addition to weekly Decadron. Status post 18 cycles. She is tolerating her treatment well. I recommended for the patient to proceed with cycle #19 today as a scheduled.  I recommended for the patient to come back for follow-up visit in 3 weeks with repeating myeloma panel for reevaluation of her disease. The patient will continue her treatment with Zometa every 3 months as a scheduled. For the low back pain, she will continue on Percocet as needed basis.  She was advised to call immediately if she has any concerning symptoms and interval. The patient voices understanding of current disease status and treatment options and is in agreement with the current care plan.  All questions were answered. The patient knows to call the clinic with any problems, questions or concerns. We can certainly see the patient much sooner if necessary.  Disclaimer: This note was dictated with voice recognition software. Similar sounding words can inadvertently be transcribed and may not be corrected upon review.

## 2016-10-29 NOTE — Patient Instructions (Signed)
[facility] Discharge Instructions for Patients Receiving Chemotherapy  Today you received the following chemotherapy agents Velcade. To help prevent nausea and vomiting after your treatment, we encourage you to take your nausea medication as directed.  If you develop nausea and vomiting that is not controlled by your nausea medication, call the clinic.   BELOW ARE SYMPTOMS THAT SHOULD BE REPORTED IMMEDIATELY:  *FEVER GREATER THAN 100.5 F  *CHILLS WITH OR WITHOUT FEVER  NAUSEA AND VOMITING THAT IS NOT CONTROLLED WITH YOUR NAUSEA MEDICATION  *UNUSUAL SHORTNESS OF BREATH  *UNUSUAL BRUISING OR BLEEDING  TENDERNESS IN MOUTH AND THROAT WITH OR WITHOUT PRESENCE OF ULCERS  *URINARY PROBLEMS  *BOWEL PROBLEMS  UNUSUAL RASH Items with * indicate a potential emergency and should be followed up as soon as possible.  Feel free to call the clinic you have any questions or concerns. The clinic phone number is [phone].  Please show the CHEMO ALERT CARD at check-in to the Emergency Department and triage nurse.    

## 2016-10-29 NOTE — Telephone Encounter (Signed)
Appointments scheduled. Patient given AVS report and calendars with future scheduled appointments.

## 2016-11-05 ENCOUNTER — Ambulatory Visit (HOSPITAL_BASED_OUTPATIENT_CLINIC_OR_DEPARTMENT_OTHER): Payer: Medicare Other

## 2016-11-05 ENCOUNTER — Other Ambulatory Visit (HOSPITAL_BASED_OUTPATIENT_CLINIC_OR_DEPARTMENT_OTHER): Payer: Medicare Other

## 2016-11-05 VITALS — BP 134/78 | HR 94 | Temp 98.4°F | Resp 18

## 2016-11-05 DIAGNOSIS — C9002 Multiple myeloma in relapse: Secondary | ICD-10-CM | POA: Diagnosis present

## 2016-11-05 DIAGNOSIS — Z5112 Encounter for antineoplastic immunotherapy: Secondary | ICD-10-CM | POA: Diagnosis present

## 2016-11-05 DIAGNOSIS — C9000 Multiple myeloma not having achieved remission: Secondary | ICD-10-CM

## 2016-11-05 LAB — COMPREHENSIVE METABOLIC PANEL
ALBUMIN: 3.1 g/dL — AB (ref 3.5–5.0)
ALK PHOS: 74 U/L (ref 40–150)
ALT: 13 U/L (ref 0–55)
AST: 18 U/L (ref 5–34)
Anion Gap: 11 mEq/L (ref 3–11)
BILIRUBIN TOTAL: 1.01 mg/dL (ref 0.20–1.20)
BUN: 16.2 mg/dL (ref 7.0–26.0)
CO2: 26 mEq/L (ref 22–29)
Calcium: 9.5 mg/dL (ref 8.4–10.4)
Chloride: 106 mEq/L (ref 98–109)
Creatinine: 0.9 mg/dL (ref 0.6–1.1)
EGFR: 74 mL/min/{1.73_m2} — AB (ref >90)
GLUCOSE: 123 mg/dL (ref 70–140)
Potassium: 4.3 mEq/L (ref 3.5–5.1)
SODIUM: 143 meq/L (ref 136–145)
TOTAL PROTEIN: 6.7 g/dL (ref 6.4–8.3)

## 2016-11-05 LAB — CBC WITH DIFFERENTIAL/PLATELET
BASO%: 0.2 % (ref 0.0–2.0)
Basophils Absolute: 0 10*3/uL (ref 0.0–0.1)
EOS ABS: 0.1 10*3/uL (ref 0.0–0.5)
EOS%: 0.6 % (ref 0.0–7.0)
HCT: 33.4 % — ABNORMAL LOW (ref 34.8–46.6)
HEMOGLOBIN: 10.6 g/dL — AB (ref 11.6–15.9)
LYMPH#: 0.7 10*3/uL — AB (ref 0.9–3.3)
LYMPH%: 5.7 % — ABNORMAL LOW (ref 14.0–49.7)
MCH: 31.4 pg (ref 25.1–34.0)
MCHC: 31.7 g/dL (ref 31.5–36.0)
MCV: 99.1 fL (ref 79.5–101.0)
MONO#: 1.5 10*3/uL — AB (ref 0.1–0.9)
MONO%: 11.4 % (ref 0.0–14.0)
NEUT%: 82.1 % — ABNORMAL HIGH (ref 38.4–76.8)
NEUTROS ABS: 10.8 10*3/uL — AB (ref 1.5–6.5)
PLATELETS: 135 10*3/uL — AB (ref 145–400)
RBC: 3.37 10*6/uL — ABNORMAL LOW (ref 3.70–5.45)
RDW: 16.1 % — AB (ref 11.2–14.5)
WBC: 13.2 10*3/uL — AB (ref 3.9–10.3)

## 2016-11-05 MED ORDER — BORTEZOMIB CHEMO SQ INJECTION 3.5 MG (2.5MG/ML)
1.3000 mg/m2 | Freq: Once | INTRAMUSCULAR | Status: AC
  Administered 2016-11-05: 2.5 mg via SUBCUTANEOUS
  Filled 2016-11-05: qty 2.5

## 2016-11-05 MED ORDER — PROCHLORPERAZINE MALEATE 10 MG PO TABS
10.0000 mg | ORAL_TABLET | Freq: Once | ORAL | Status: AC
  Administered 2016-11-05: 10 mg via ORAL

## 2016-11-05 MED ORDER — PROCHLORPERAZINE MALEATE 10 MG PO TABS
ORAL_TABLET | ORAL | Status: AC
  Filled 2016-11-05: qty 1

## 2016-11-05 NOTE — Patient Instructions (Signed)
[facility] Discharge Instructions for Patients Receiving Chemotherapy  Today you received the following chemotherapy agents Velcade. To help prevent nausea and vomiting after your treatment, we encourage you to take your nausea medication as directed.  If you develop nausea and vomiting that is not controlled by your nausea medication, call the clinic.   BELOW ARE SYMPTOMS THAT SHOULD BE REPORTED IMMEDIATELY:  *FEVER GREATER THAN 100.5 F  *CHILLS WITH OR WITHOUT FEVER  NAUSEA AND VOMITING THAT IS NOT CONTROLLED WITH YOUR NAUSEA MEDICATION  *UNUSUAL SHORTNESS OF BREATH  *UNUSUAL BRUISING OR BLEEDING  TENDERNESS IN MOUTH AND THROAT WITH OR WITHOUT PRESENCE OF ULCERS  *URINARY PROBLEMS  *BOWEL PROBLEMS  UNUSUAL RASH Items with * indicate a potential emergency and should be followed up as soon as possible.  Feel free to call the clinic you have any questions or concerns. The clinic phone number is [phone].  Please show the CHEMO ALERT CARD at check-in to the Emergency Department and triage nurse.    

## 2016-11-12 ENCOUNTER — Ambulatory Visit (HOSPITAL_BASED_OUTPATIENT_CLINIC_OR_DEPARTMENT_OTHER): Payer: Medicare Other

## 2016-11-12 ENCOUNTER — Other Ambulatory Visit (HOSPITAL_BASED_OUTPATIENT_CLINIC_OR_DEPARTMENT_OTHER): Payer: Medicare Other

## 2016-11-12 VITALS — BP 122/76 | HR 97 | Resp 20

## 2016-11-12 DIAGNOSIS — C9000 Multiple myeloma not having achieved remission: Secondary | ICD-10-CM | POA: Diagnosis not present

## 2016-11-12 DIAGNOSIS — Z5112 Encounter for antineoplastic immunotherapy: Secondary | ICD-10-CM | POA: Diagnosis present

## 2016-11-12 DIAGNOSIS — C9002 Multiple myeloma in relapse: Secondary | ICD-10-CM

## 2016-11-12 LAB — CBC WITH DIFFERENTIAL/PLATELET
BASO%: 0.1 % (ref 0.0–2.0)
Basophils Absolute: 0 10*3/uL (ref 0.0–0.1)
EOS ABS: 0.1 10*3/uL (ref 0.0–0.5)
EOS%: 0.4 % (ref 0.0–7.0)
HCT: 32.8 % — ABNORMAL LOW (ref 34.8–46.6)
HEMOGLOBIN: 10.3 g/dL — AB (ref 11.6–15.9)
LYMPH#: 1 10*3/uL (ref 0.9–3.3)
LYMPH%: 7 % — AB (ref 14.0–49.7)
MCH: 31.7 pg (ref 25.1–34.0)
MCHC: 31.4 g/dL — ABNORMAL LOW (ref 31.5–36.0)
MCV: 100.9 fL (ref 79.5–101.0)
MONO#: 1.2 10*3/uL — AB (ref 0.1–0.9)
MONO%: 8.5 % (ref 0.0–14.0)
NEUT%: 84 % — ABNORMAL HIGH (ref 38.4–76.8)
NEUTROS ABS: 11.4 10*3/uL — AB (ref 1.5–6.5)
PLATELETS: 128 10*3/uL — AB (ref 145–400)
RBC: 3.25 10*6/uL — AB (ref 3.70–5.45)
RDW: 15.2 % — AB (ref 11.2–14.5)
WBC: 13.5 10*3/uL — AB (ref 3.9–10.3)

## 2016-11-12 LAB — COMPREHENSIVE METABOLIC PANEL
ALBUMIN: 3.1 g/dL — AB (ref 3.5–5.0)
ALT: 76 U/L — AB (ref 0–55)
ANION GAP: 10 meq/L (ref 3–11)
AST: 66 U/L — ABNORMAL HIGH (ref 5–34)
Alkaline Phosphatase: 101 U/L (ref 40–150)
BUN: 16.3 mg/dL (ref 7.0–26.0)
CO2: 27 meq/L (ref 22–29)
Calcium: 9.8 mg/dL (ref 8.4–10.4)
Chloride: 105 mEq/L (ref 98–109)
Creatinine: 0.9 mg/dL (ref 0.6–1.1)
EGFR: 78 mL/min/{1.73_m2} — AB (ref >90)
GLUCOSE: 152 mg/dL — AB (ref 70–140)
Potassium: 4 mEq/L (ref 3.5–5.1)
SODIUM: 142 meq/L (ref 136–145)
TOTAL PROTEIN: 6.3 g/dL — AB (ref 6.4–8.3)
Total Bilirubin: 1.25 mg/dL — ABNORMAL HIGH (ref 0.20–1.20)

## 2016-11-12 LAB — LACTATE DEHYDROGENASE: LDH: 237 U/L (ref 125–245)

## 2016-11-12 MED ORDER — PROCHLORPERAZINE MALEATE 10 MG PO TABS
ORAL_TABLET | ORAL | Status: AC
  Filled 2016-11-12: qty 1

## 2016-11-12 MED ORDER — BORTEZOMIB CHEMO SQ INJECTION 3.5 MG (2.5MG/ML)
1.3000 mg/m2 | Freq: Once | INTRAMUSCULAR | Status: AC
  Administered 2016-11-12: 2.5 mg via SUBCUTANEOUS
  Filled 2016-11-12: qty 2.5

## 2016-11-12 MED ORDER — PROCHLORPERAZINE MALEATE 10 MG PO TABS
10.0000 mg | ORAL_TABLET | Freq: Once | ORAL | Status: AC
  Administered 2016-11-12: 10 mg via ORAL

## 2016-11-12 NOTE — Patient Instructions (Signed)
[facility] Discharge Instructions for Patients Receiving Chemotherapy  Today you received the following chemotherapy agents Velcade. To help prevent nausea and vomiting after your treatment, we encourage you to take your nausea medication as directed.  If you develop nausea and vomiting that is not controlled by your nausea medication, call the clinic.   BELOW ARE SYMPTOMS THAT SHOULD BE REPORTED IMMEDIATELY:  *FEVER GREATER THAN 100.5 F  *CHILLS WITH OR WITHOUT FEVER  NAUSEA AND VOMITING THAT IS NOT CONTROLLED WITH YOUR NAUSEA MEDICATION  *UNUSUAL SHORTNESS OF BREATH  *UNUSUAL BRUISING OR BLEEDING  TENDERNESS IN MOUTH AND THROAT WITH OR WITHOUT PRESENCE OF ULCERS  *URINARY PROBLEMS  *BOWEL PROBLEMS  UNUSUAL RASH Items with * indicate a potential emergency and should be followed up as soon as possible.  Feel free to call the clinic you have any questions or concerns. The clinic phone number is [phone].  Please show the CHEMO ALERT CARD at check-in to the Emergency Department and triage nurse.    

## 2016-11-13 LAB — KAPPA/LAMBDA LIGHT CHAINS
IG KAPPA FREE LIGHT CHAIN: 6.8 mg/L (ref 3.3–19.4)
Ig Lambda Free Light Chain: 2.7 mg/L — ABNORMAL LOW (ref 5.7–26.3)
Kappa/Lambda FluidC Ratio: 2.52 — ABNORMAL HIGH (ref 0.26–1.65)

## 2016-11-13 LAB — IGG, IGA, IGM
IGG (IMMUNOGLOBIN G), SERUM: 225 mg/dL — AB (ref 700–1600)
IGM (IMMUNOGLOBIN M), SRM: 21 mg/dL — AB (ref 26–217)
IgA, Qn, Serum: 504 mg/dL — ABNORMAL HIGH (ref 87–352)

## 2016-11-13 LAB — BETA 2 MICROGLOBULIN, SERUM: BETA 2: 2.4 mg/L (ref 0.6–2.4)

## 2016-11-19 ENCOUNTER — Ambulatory Visit (HOSPITAL_BASED_OUTPATIENT_CLINIC_OR_DEPARTMENT_OTHER): Payer: Medicare Other | Admitting: Internal Medicine

## 2016-11-19 ENCOUNTER — Ambulatory Visit (HOSPITAL_BASED_OUTPATIENT_CLINIC_OR_DEPARTMENT_OTHER): Payer: Medicare Other

## 2016-11-19 ENCOUNTER — Encounter: Payer: Self-pay | Admitting: Internal Medicine

## 2016-11-19 ENCOUNTER — Telehealth: Payer: Self-pay | Admitting: *Deleted

## 2016-11-19 ENCOUNTER — Telehealth: Payer: Self-pay | Admitting: Internal Medicine

## 2016-11-19 ENCOUNTER — Other Ambulatory Visit (HOSPITAL_BASED_OUTPATIENT_CLINIC_OR_DEPARTMENT_OTHER): Payer: Medicare Other

## 2016-11-19 DIAGNOSIS — C9002 Multiple myeloma in relapse: Secondary | ICD-10-CM

## 2016-11-19 DIAGNOSIS — C9000 Multiple myeloma not having achieved remission: Secondary | ICD-10-CM

## 2016-11-19 DIAGNOSIS — M545 Low back pain: Secondary | ICD-10-CM

## 2016-11-19 DIAGNOSIS — Z5112 Encounter for antineoplastic immunotherapy: Secondary | ICD-10-CM

## 2016-11-19 LAB — COMPREHENSIVE METABOLIC PANEL
ALT: 22 U/L (ref 0–55)
AST: 20 U/L (ref 5–34)
Albumin: 3.2 g/dL — ABNORMAL LOW (ref 3.5–5.0)
Alkaline Phosphatase: 85 U/L (ref 40–150)
Anion Gap: 10 mEq/L (ref 3–11)
BILIRUBIN TOTAL: 1.49 mg/dL — AB (ref 0.20–1.20)
BUN: 13.6 mg/dL (ref 7.0–26.0)
CHLORIDE: 103 meq/L (ref 98–109)
CO2: 28 meq/L (ref 22–29)
Calcium: 10.3 mg/dL (ref 8.4–10.4)
Creatinine: 0.9 mg/dL (ref 0.6–1.1)
EGFR: 74 mL/min/{1.73_m2} — AB (ref >90)
GLUCOSE: 146 mg/dL — AB (ref 70–140)
Potassium: 4.5 mEq/L (ref 3.5–5.1)
SODIUM: 140 meq/L (ref 136–145)
TOTAL PROTEIN: 6.9 g/dL (ref 6.4–8.3)

## 2016-11-19 LAB — CBC WITH DIFFERENTIAL/PLATELET
BASO%: 0 % (ref 0.0–2.0)
Basophils Absolute: 0 10*3/uL (ref 0.0–0.1)
EOS%: 0.1 % (ref 0.0–7.0)
Eosinophils Absolute: 0 10*3/uL (ref 0.0–0.5)
HCT: 35.1 % (ref 34.8–46.6)
HGB: 11.3 g/dL — ABNORMAL LOW (ref 11.6–15.9)
LYMPH%: 5.8 % — AB (ref 14.0–49.7)
MCH: 31.7 pg (ref 25.1–34.0)
MCHC: 32.2 g/dL (ref 31.5–36.0)
MCV: 98.6 fL (ref 79.5–101.0)
MONO#: 2.1 10*3/uL — ABNORMAL HIGH (ref 0.1–0.9)
MONO%: 8.3 % (ref 0.0–14.0)
NEUT%: 85.8 % — ABNORMAL HIGH (ref 38.4–76.8)
NEUTROS ABS: 21.5 10*3/uL — AB (ref 1.5–6.5)
NRBC: 0 % (ref 0–0)
Platelets: 165 10*3/uL (ref 145–400)
RBC: 3.56 10*6/uL — AB (ref 3.70–5.45)
RDW: 14.9 % — AB (ref 11.2–14.5)
WBC: 25.1 10*3/uL — AB (ref 3.9–10.3)
lymph#: 1.5 10*3/uL (ref 0.9–3.3)

## 2016-11-19 MED ORDER — OXYCODONE-ACETAMINOPHEN 5-325 MG PO TABS: 1.0000 | ORAL_TABLET | Freq: Three times a day (TID) | ORAL | 0 refills | Status: DC | PRN

## 2016-11-19 MED ORDER — BORTEZOMIB CHEMO SQ INJECTION 3.5 MG (2.5MG/ML)
1.3000 mg/m2 | Freq: Once | INTRAMUSCULAR | Status: AC
  Administered 2016-11-19: 2.5 mg via SUBCUTANEOUS
  Filled 2016-11-19: qty 2.5

## 2016-11-19 MED ORDER — PROCHLORPERAZINE MALEATE 10 MG PO TABS
10.0000 mg | ORAL_TABLET | Freq: Once | ORAL | Status: AC
  Administered 2016-11-19: 10 mg via ORAL

## 2016-11-19 MED ORDER — PROCHLORPERAZINE MALEATE 10 MG PO TABS
ORAL_TABLET | ORAL | Status: AC
  Filled 2016-11-19: qty 1

## 2016-11-19 MED ORDER — DEXAMETHASONE 4 MG PO TABS: ORAL_TABLET | ORAL | 2 refills | Status: DC

## 2016-11-19 NOTE — Telephone Encounter (Signed)
Per LOS I have scheduled appts and notified the sceduler

## 2016-11-19 NOTE — Patient Instructions (Signed)
[facility] Discharge Instructions for Patients Receiving Chemotherapy  Today you received the following chemotherapy agents Velcade. To help prevent nausea and vomiting after your treatment, we encourage you to take your nausea medication as directed.  If you develop nausea and vomiting that is not controlled by your nausea medication, call the clinic.   BELOW ARE SYMPTOMS THAT SHOULD BE REPORTED IMMEDIATELY:  *FEVER GREATER THAN 100.5 F  *CHILLS WITH OR WITHOUT FEVER  NAUSEA AND VOMITING THAT IS NOT CONTROLLED WITH YOUR NAUSEA MEDICATION  *UNUSUAL SHORTNESS OF BREATH  *UNUSUAL BRUISING OR BLEEDING  TENDERNESS IN MOUTH AND THROAT WITH OR WITHOUT PRESENCE OF ULCERS  *URINARY PROBLEMS  *BOWEL PROBLEMS  UNUSUAL RASH Items with * indicate a potential emergency and should be followed up as soon as possible.  Feel free to call the clinic you have any questions or concerns. The clinic phone number is [phone].  Please show the CHEMO ALERT CARD at check-in to the Emergency Department and triage nurse.    

## 2016-11-19 NOTE — Telephone Encounter (Signed)
Appointments scheduled per 11/20 LOS. Patient had to leave for infusion before finishing with her schedule. The patient will pick up her report and calendars in infusion.

## 2016-11-19 NOTE — Progress Notes (Signed)
[place] [phone]   [phone]  OFFICE PROGRESS NOTE  [name], MD [address], [place] [address]  DIAGNOSIS: Stage IIA IgA kappa multiple myeloma diagnosed in 2006   PRIOR THERAPY:  1) status post treatment with thalidomide and Decadron followed by autologous peripheral blood stem cell transplant with high-dose melphalan on 04/26/2006 at [facility].  2) the patient had evidence for disease recurrence in August of 2008 and she was treated with 6 cycles of Velcade completed on 03/30/2008 with response to the treatment but was discontinued secondary to neuropathy.  3) maintenance treatment with Revlimid 10 mg by mouth daily by the does was later increase to 15 mg by mouth daily secondary to biochemical recurrence with stabilization of her disease. The treatment with Revlimid 15 mg by mouth daily was discontinued secondary to evidence for disease progression. 4) Revlimid 25 mg by mouth daily for 3 weeks every 4 weeks in addition to Decadron currently at 20 mg by mouth on a weekly basis. She is status post 12 cycles. This was discontinued secondary to disease progression. 5) systemic chemotherapy with Daratumumab 16 MG/KG weekly for the first 9 weeks, Velcade 1.3 MG/M2 subcutaneously on days 1, 4, 8 and 11 every 3 weeks in addition to Decadron 20 mg by mouth on days 1, 2, 4, 5, 8, 9, 11 and 12 every 3 weeks. First dose 05/07/2016. Status post one cycle discontinued secondary to intolerance.   CURRENT THERAPY:  1) systemic chemotherapy with Velcade 1.3 MG/M2 subcutaneously weekly in addition to Decadron 40 mg by mouth daily. Status post 21 weekly cycles. 2) Zometa 4 mg IV every 3 months. First dose in 05/05/2015  ADVANCED DIRECTIVE: She does not have advanced directive and was given information.  INTERVAL HISTORY: Darlene Howard 65 y.o. female returns to the clinic today for routine monthly followup visit accompanied  by her brother and sister-in-law. The patient is doing fine today with no specific complaints. She had an episode of abdominal pain earlier today but this resolved with pain medication. She continues to tolerate her treatment with weekly subcutaneous Velcade and Decadron fairly well. She status post 21 cycles. The patient denied having any fever or chills. She denied having any shortness of breath, cough or hemoptysis. She has no weight loss or night sweats. She denied having any peripheral neuropathy. She had repeat myeloma panel performed recently and she is here for evaluation and discussion of her lab results.  MEDICAL HISTORY: Past Medical History:  Diagnosis Date  . Bruises easily    d/t being on COumadin  . Cholelithiasis   . DDD (degenerative disc disease), lumbar   . Fatty liver   . GERD (gastroesophageal reflux disease)    takes Pantoprazole daily  . Glaucoma    uses eye drops as instructed  . H/O stem cell transplant ([place])    04-26-2006  AT [facility]  . Herpes    takes Valtrex as needed  . History of colon polyps   . Hypertension    takes Amlodipine and Diovan daily  . Lung cancer ([place])    chest wall lesion aadn 6th rib  . Multiple myeloma DX  2006  STATE IIA,  IgA  KAPPA   STABLE  PER DR Pegge Cumberledge--  S/P STEM CELL TRANSPLANT 2007 /  RECURRENCE 2008  CHEMO ENDED 03-30-2008/   NOW ON MAINTANANCE REVLIMID )  . Neuropathy associated with multiple myeloma ([facility])    POST  CHEMOTHERAPY AND STEM CELL TRANSPLANT  . Personal history of colon cancer    ASCENDING COLON--  S/P  RIGHT HEMICOLECTOMY WITH NEGATIVE NODES/    NO RECURRENCE  . Type 2 diabetes mellitus ([place])   . Vocal cord nodule    resolved- see Dr. Deeann Saint notes in media tab 06/17/14    ALLERGIES:  is allergic to codeine and tramadol.  MEDICATIONS:  Current Outpatient Prescriptions  Medication Sig Dispense Refill  . acetaminophen (TYLENOL) 500 MG tablet Take 500 mg by mouth daily as needed for moderate pain. Reported on  06/25/2016    . aspirin (ASPIRIN LOW DOSE) 81 MG EC tablet Take 81 mg by mouth daily.      . calcium-vitamin D (OSCAL 500/200 D-3) 500-200 MG-UNIT per tablet Take 1 tablet by mouth 3 (three) times daily.      . cholecalciferol (VITAMIN D) 1000 UNITS tablet Take 1,000 Units by mouth daily. Reported on 04/24/2016    . dexamethasone (DECADRON) 4 MG tablet Take 10 tabs (40 mg) once weekly while undergoing Velcade injections. 40 tablet 2  . dorzolamide-timolol (COSOPT) 22.3-6.8 MG/ML ophthalmic solution Place 1 drop into the left eye 2 (two) times daily.     Marland Kitchen gabapentin (NEURONTIN) 100 MG capsule Take 1 capsule by mouth 3  times daily 270 capsule 1  . loperamide (IMODIUM A-D) 2 MG tablet Take 2 mg by mouth daily as needed for diarrhea or loose stools. Reported on 07/09/2016    . loratadine (CLARITIN) 10 MG tablet Take 1 tablet (10 mg total) by mouth daily. 30 tablet 5  . LUMIGAN 0.01 % SOLN Place 1 drop into both eyes at bedtime.     . Multiple Vitamins-Minerals (CENTRUM SILVER PO) Take 1 tablet by mouth daily.     . ondansetron (ZOFRAN) 4 MG tablet Take 1 tablet (4 mg total) by mouth every 6 (six) hours. 10 tablet 0  . oxyCODONE-acetaminophen (PERCOCET/ROXICET) 5-325 MG tablet Take 1 tablet by mouth every 8 (eight) hours as needed for severe pain. 40 tablet 0  . pantoprazole (PROTONIX) 40 MG tablet TAKE 1 TABLET BY MOUTH  DAILY FOR ACID REFLUX 90 tablet 1  . potassium chloride SA (K-DUR,KLOR-CON) 20 MEQ tablet TAKE 1 TABLET BY MOUTH  TWICE A DAY 180 tablet 1  . PREMARIN vaginal cream PLACE 1 APPLICATORFUL VAGINALLY TWICE A WEEK. 30 g 0  . temazepam (RESTORIL) 15 MG capsule TAKE 1 CAPSULE AT BEDTIME AS NEEDED FOR SLEEP. 30 capsule 2  . valACYclovir (VALTREX) 1000 MG tablet TAKE 1 TABLET BY MOUTH  DAILY AS NEEDED 90 tablet 0  . valsartan-hydrochlorothiazide (DIOVAN-HCT) 320-25 MG tablet Take 1 tablet by mouth  daily 90 tablet 1   No current facility-administered medications for this visit.      SURGICAL HISTORY:  Past Surgical History:  Procedure Laterality Date  . ABDOMINAL HYSTERECTOMY  1983   w/  appendectomy and left salpingoophorectom  . CATARACT EXTRACTION W/PHACO Left 01/20/2014   Procedure: CATARACT EXTRACTION PHACO AND INTRAOCULAR LENS PLACEMENT (IOC);  Surgeon: Adonis Brook, MD;  Location: [facility];  Service: Ophthalmology;  Laterality: Left;  . CERVICAL CONE BIOPSY    . CHOLECYSTECTOMY N/A 02/28/2016   Procedure: LAPAROSCOPIC CHOLECYSTECTOMY;  Surgeon: Ralene Ok, MD;  Location: [facility];  Service: General;  Laterality: N/A;  . CO2  ABLATION VULVA PERIANAL AND VAGINAL DYSPLATIC AREAS  02-15-2010  . COLONOSCOPY  10/07/2006   INO:[facility] rectum/Status post right hemicolectomy. Normal residual colon  . COLONOSCOPY  10/2009   RMR:  diminutive RS polyp (hyperplastic).   . COLONOSCOPY N/A 12/15/2014   Procedure: COLONOSCOPY;  Surgeon: Daneil Dolin, MD;  [facility];  Service: Endoscopy;  Laterality: N/A;  130   . ESOPHAGOGASTRODUODENOSCOPY  04/02/2005   LGX:QJJHER/DEYCXKGYJEHU polyp with central erosion versus ulceration in the body of the stomach, resected and recovered Remainder of the gastric mucosa, D1, D2 appeared normal  . HEMICOLECTOMY Right 04-27-2005   CARCINOMA   ASCENDING COLON  . LESION REMOVAL N/A 05/06/2014   Procedure: LASER OF THE VAGINA;  Surgeon: Janie Morning, MD;  Location: [facility];  Service: Gynecology;  Laterality: N/A;  . LIMBAL STEM CELL TRANSPLANT  04-26-2006    ([facility])  . VULVAR LESION REMOVAL N/A 05/06/2014   Procedure: LASER OF VULVAR LESION;  Surgeon: Janie Morning, MD;  Location: [facility];  Service: Gynecology;  Laterality: N/A;  . VULVECTOMY N/A 05/06/2014   Procedure: WIDE LOCAL EXCISION LEFT LABIA MAJORA;  Surgeon: Janie Morning, MD;  Location: [facility];  Service: Gynecology;  Laterality: N/A;    REVIEW OF SYSTEMS:  Constitutional: positive for fatigue Eyes:  negative Ears, nose, mouth, throat, and face: negative Respiratory: negative Cardiovascular: negative Gastrointestinal: negative Genitourinary:negative Integument/breast: negative Hematologic/lymphatic: negative Musculoskeletal:positive for back pain Neurological: negative Behavioral/Psych: negative Endocrine: negative Allergic/Immunologic: negative   PHYSICAL EXAMINATION: General appearance: alert, cooperative and no distress Head: Normocephalic, without obvious abnormality, atraumatic Neck: no adenopathy Lymph nodes: Cervical, supraclavicular, and axillary nodes normal. Resp: clear to auscultation bilaterally Back: symmetric, no curvature. ROM normal. No CVA tenderness. Cardio: regular rate and rhythm, S1, S2 normal, no murmur, click, rub or gallop GI: soft, non-tender; bowel sounds normal; no masses,  no organomegaly Extremities: extremities normal, atraumatic, no cyanosis or edema Neurologic: Alert and oriented X 3, normal strength and tone. Normal symmetric reflexes. Normal coordination and gait  ECOG PERFORMANCE STATUS: 1 - Symptomatic but completely ambulatory  Blood pressure 117/68, pulse (!) 114, temperature 98.3 F (36.8 C), temperature source Oral, resp. rate 17, height '5\' 6"'  (1.676 m), weight 186 lb (84.4 kg), SpO2 100 %.  LABORATORY DATA: Lab Results  Component Value Date   WBC 25.1 (H) 11/19/2016   HGB 11.3 (L) 11/19/2016   HCT 35.1 11/19/2016   MCV 98.6 11/19/2016   PLT 165 11/19/2016      Chemistry      Component Value Date/Time   NA 142 11/12/2016 1101   K 4.0 11/12/2016 1101   CL 110 08/23/2016 1000   CL 105 06/17/2013 1441   CO2 27 11/12/2016 1101   BUN 16.3 11/12/2016 1101   CREATININE 0.9 11/12/2016 1101      Component Value Date/Time   CALCIUM 9.8 11/12/2016 1101   ALKPHOS 101 11/12/2016 1101   AST 66 (H) 11/12/2016 1101   ALT 76 (H) 11/12/2016 1101   BILITOT 1.25 (H) 11/12/2016 1101       RADIOGRAPHIC STUDIES: No results  found.  ASSESSMENT AND PLAN: This is a very pleasant 65 years old [place] American female with history of multiple myeloma currently on treatment with Revlimid and low-dose Decadron and tolerated her treatment fairly well. The patient is doing fine today with no specific complaints except for mild fatigue.  Because of disease progression, I recommended for her to continue her treatment with Revlimid and Decadron but I increased the dose of Revlimid to 25 mg by mouth daily for 21 days every 4 weeks, status post 12 months of this regimen. The recent myeloma panel performed  at [facility] showed evidence for disease progression. The patient also has enlarging right-sided chest wall lesion. She underwent palliative radiotherapy to the enlarging right-posterior seventh rib lesion. She was treated with 1 cycle of chemotherapy with Daratumumab, Velcade and Decadron but this was discontinued secondary to intolerance and pancytopenia. She is currently undergoing treatment with weekly subcutaneous Velcade in addition to weekly Decadron. Status post 21 cycles. She is tolerating her treatment well. The myeloma panel performed recently showed further improvement of her disease. I discussed the lab result with the patient and her family. I recommended for her to continue her current treatment with Velcade and Decadron. I recommended for the patient to proceed with cycle #22 today as a scheduled.  I recommended for the patient to come back for follow-up visit in 3 weeks.  The patient will continue her treatment with Zometa every 3 months as a scheduled. For the low back pain, she will continue on Percocet as needed basis. I gave her refill of Percocet today. She was encouraged to increase her hydration. She was advised to call immediately if she has any concerning symptoms and interval. The patient voices understanding of current disease status and treatment options and is in agreement with the current care  plan.  All questions were answered. The patient knows to call the clinic with any problems, questions or concerns. We can certainly see the patient much sooner if necessary.  Disclaimer: This note was dictated with voice recognition software. Similar sounding words can inadvertently be transcribed and may not be corrected upon review.

## 2016-11-26 ENCOUNTER — Ambulatory Visit (HOSPITAL_BASED_OUTPATIENT_CLINIC_OR_DEPARTMENT_OTHER): Payer: Medicare Other

## 2016-11-26 ENCOUNTER — Other Ambulatory Visit (HOSPITAL_BASED_OUTPATIENT_CLINIC_OR_DEPARTMENT_OTHER): Payer: Medicare Other

## 2016-11-26 VITALS — BP 115/64 | HR 99 | Temp 98.4°F | Resp 16

## 2016-11-26 DIAGNOSIS — Z5112 Encounter for antineoplastic immunotherapy: Secondary | ICD-10-CM

## 2016-11-26 DIAGNOSIS — C9000 Multiple myeloma not having achieved remission: Secondary | ICD-10-CM

## 2016-11-26 DIAGNOSIS — C9002 Multiple myeloma in relapse: Secondary | ICD-10-CM

## 2016-11-26 LAB — COMPREHENSIVE METABOLIC PANEL
ALT: 13 U/L (ref 0–55)
AST: 17 U/L (ref 5–34)
Albumin: 2.9 g/dL — ABNORMAL LOW (ref 3.5–5.0)
Alkaline Phosphatase: 78 U/L (ref 40–150)
Anion Gap: 10 mEq/L (ref 3–11)
BUN: 16.2 mg/dL (ref 7.0–26.0)
CALCIUM: 9.5 mg/dL (ref 8.4–10.4)
CHLORIDE: 104 meq/L (ref 98–109)
CO2: 26 meq/L (ref 22–29)
Creatinine: 0.9 mg/dL (ref 0.6–1.1)
EGFR: 74 mL/min/{1.73_m2} — ABNORMAL LOW (ref >90)
Glucose: 141 mg/dl — ABNORMAL HIGH (ref 70–140)
POTASSIUM: 3.8 meq/L (ref 3.5–5.1)
Sodium: 139 mEq/L (ref 136–145)
Total Bilirubin: 1.01 mg/dL (ref 0.20–1.20)
Total Protein: 6.6 g/dL (ref 6.4–8.3)

## 2016-11-26 LAB — CBC WITH DIFFERENTIAL/PLATELET
BASO%: 0.2 % (ref 0.0–2.0)
BASOS ABS: 0 10*3/uL (ref 0.0–0.1)
EOS%: 0.2 % (ref 0.0–7.0)
Eosinophils Absolute: 0 10*3/uL (ref 0.0–0.5)
HEMATOCRIT: 32 % — AB (ref 34.8–46.6)
HGB: 10.2 g/dL — ABNORMAL LOW (ref 11.6–15.9)
LYMPH#: 0.7 10*3/uL — AB (ref 0.9–3.3)
LYMPH%: 3.9 % — AB (ref 14.0–49.7)
MCH: 31.3 pg (ref 25.1–34.0)
MCHC: 31.8 g/dL (ref 31.5–36.0)
MCV: 98.2 fL (ref 79.5–101.0)
MONO#: 1.7 10*3/uL — ABNORMAL HIGH (ref 0.1–0.9)
MONO%: 9.7 % (ref 0.0–14.0)
NEUT#: 15.1 10*3/uL — ABNORMAL HIGH (ref 1.5–6.5)
NEUT%: 86 % — AB (ref 38.4–76.8)
Platelets: 174 10*3/uL (ref 145–400)
RBC: 3.25 10*6/uL — AB (ref 3.70–5.45)
RDW: 16 % — ABNORMAL HIGH (ref 11.2–14.5)
WBC: 17.6 10*3/uL — ABNORMAL HIGH (ref 3.9–10.3)

## 2016-11-26 MED ORDER — BORTEZOMIB CHEMO SQ INJECTION 3.5 MG (2.5MG/ML)
1.3000 mg/m2 | Freq: Once | INTRAMUSCULAR | Status: AC
  Administered 2016-11-26: 2.5 mg via SUBCUTANEOUS
  Filled 2016-11-26: qty 2.5

## 2016-11-26 MED ORDER — PROCHLORPERAZINE MALEATE 10 MG PO TABS
10.0000 mg | ORAL_TABLET | Freq: Once | ORAL | Status: AC
  Administered 2016-11-26: 10 mg via ORAL

## 2016-11-26 MED ORDER — PROCHLORPERAZINE MALEATE 10 MG PO TABS
ORAL_TABLET | ORAL | Status: AC
  Filled 2016-11-26: qty 1

## 2016-11-26 NOTE — Patient Instructions (Signed)
[facility] Discharge Instructions for Patients Receiving Chemotherapy  Today you received the following chemotherapy agents Velcade. To help prevent nausea and vomiting after your treatment, we encourage you to take your nausea medication as directed.  If you develop nausea and vomiting that is not controlled by your nausea medication, call the clinic.   BELOW ARE SYMPTOMS THAT SHOULD BE REPORTED IMMEDIATELY:  *FEVER GREATER THAN 100.5 F  *CHILLS WITH OR WITHOUT FEVER  NAUSEA AND VOMITING THAT IS NOT CONTROLLED WITH YOUR NAUSEA MEDICATION  *UNUSUAL SHORTNESS OF BREATH  *UNUSUAL BRUISING OR BLEEDING  TENDERNESS IN MOUTH AND THROAT WITH OR WITHOUT PRESENCE OF ULCERS  *URINARY PROBLEMS  *BOWEL PROBLEMS  UNUSUAL RASH Items with * indicate a potential emergency and should be followed up as soon as possible.  Feel free to call the clinic you have any questions or concerns. The clinic phone number is [phone].  Please show the CHEMO ALERT CARD at check-in to the Emergency Department and triage nurse.    

## 2016-11-29 ENCOUNTER — Ambulatory Visit (INDEPENDENT_AMBULATORY_CARE_PROVIDER_SITE_OTHER): Payer: Medicare Other

## 2016-11-29 VITALS — BP 120/80 | HR 102 | Resp 18 | Ht 66.0 in | Wt 187.0 lb

## 2016-11-29 DIAGNOSIS — Z0000 Encounter for general adult medical examination without abnormal findings: Secondary | ICD-10-CM

## 2016-11-29 NOTE — Patient Instructions (Signed)
Thank you for choosing [facility] for your health care needs  The Annual Wellness Visit is designed to allow [place] the chance to assist you in preserving and improving you health.   Dr. Moshe Cipro will see you back in February  Any labs needed will be mailed to you to have done before visit  If you have any concerns please feel free to contact the office  Have a happy and safe holiday!

## 2016-11-30 NOTE — Progress Notes (Signed)
Subjective:    Darlene Howard is a 65 y.o. female who presents for Medicare Annual/Subsequent preventive examination.  Preventive Screening-Counseling & Management  Tobacco History  Smoking Status  . Never Smoker  Smokeless Tobacco  . Never Used      Current Problems (verified) Patient Active Problem List   Diagnosis Date Noted  . Need for prophylactic vaccination and inoculation against influenza 09/03/2016  . Encounter for antineoplastic chemotherapy 08/27/2016  . Insomnia 05/23/2016  . Thrombocytopenia ([place]) 05/21/2016  . Multiple myeloma not having achieved remission ([place]) 03/12/2016  . H/O colon cancer, stage I   . Fatty liver 12/06/2014  . GERD (gastroesophageal reflux disease) 11/02/2014  . 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 Back pain with left-sided radiculopathy 02/22/2014  . Goiter 02/22/2014  . DISORDER OF BONE AND CARTILAGE UNSPECIFIED 08/16/2010  . Allergic rhinitis 04/30/2010  . Carcinoma in situ of colon 09/28/2008  . Multiple myeloma ([place]) 05/03/2008  . Glaucoma 05/03/2008  . Essential hypertension 05/03/2008    Medications Prior to Visit Current Outpatient Prescriptions on File Prior to Visit  Medication Sig Dispense Refill  . acetaminophen (TYLENOL) 500 MG tablet Take 500 mg by mouth daily as needed for moderate pain. Reported on 06/25/2016    . aspirin (ASPIRIN LOW DOSE) 81 MG EC tablet Take 81 mg by mouth daily.      . calcium-vitamin D (OSCAL 500/200 D-3) 500-200 MG-UNIT per tablet Take 1 tablet by mouth 3 (three) times daily.      . cholecalciferol (VITAMIN D) 1000 UNITS tablet Take 1,000 Units by mouth daily. Reported on 04/24/2016    . dexamethasone (DECADRON) 4 MG tablet Take 10 tabs (40 mg) once weekly while undergoing Velcade injections. 40 tablet 2  . dorzolamide-timolol (COSOPT) 22.3-6.8 MG/ML ophthalmic solution Place 1 drop into the left eye 2 (two)  times daily.     Marland Kitchen gabapentin (NEURONTIN) 100 MG capsule Take 1 capsule by mouth 3  times daily 270 capsule 1  . loperamide (IMODIUM A-D) 2 MG tablet Take 2 mg by mouth daily as needed for diarrhea or loose stools. Reported on 07/09/2016    . loratadine (CLARITIN) 10 MG tablet Take 1 tablet (10 mg total) by mouth daily. 30 tablet 5  . LUMIGAN 0.01 % SOLN Place 1 drop into both eyes at bedtime.     . Multiple Vitamins-Minerals (CENTRUM SILVER PO) Take 1 tablet by mouth daily.     . ondansetron (ZOFRAN) 4 MG tablet Take 1 tablet (4 mg total) by mouth every 6 (six) hours. 10 tablet 0  . oxyCODONE-acetaminophen (PERCOCET/ROXICET) 5-325 MG tablet Take 1 tablet by mouth every 8 (eight) hours as needed for severe pain. 40 tablet 0  . pantoprazole (PROTONIX) 40 MG tablet TAKE 1 TABLET BY MOUTH  DAILY FOR ACID REFLUX 90 tablet 1  . potassium chloride SA (K-DUR,KLOR-CON) 20 MEQ tablet TAKE 1 TABLET BY MOUTH  TWICE A DAY 180 tablet 1  . PREMARIN vaginal cream PLACE 1 APPLICATORFUL VAGINALLY TWICE A WEEK. 30 g 0  . temazepam (RESTORIL) 15 MG capsule TAKE 1 CAPSULE AT BEDTIME AS NEEDED FOR SLEEP. 30 capsule 2  . valACYclovir (VALTREX) 1000 MG tablet TAKE 1 TABLET BY MOUTH  DAILY AS NEEDED 90 tablet 0  . valsartan-hydrochlorothiazide (DIOVAN-HCT) 320-25 MG tablet Take 1 tablet by mouth  daily 90 tablet 1   No current facility-administered medications on file prior to visit.  Current Medications (verified) Current Outpatient Prescriptions  Medication Sig Dispense Refill  . acetaminophen (TYLENOL) 500 MG tablet Take 500 mg by mouth daily as needed for moderate pain. Reported on 06/25/2016    . aspirin (ASPIRIN LOW DOSE) 81 MG EC tablet Take 81 mg by mouth daily.      . calcium-vitamin D (OSCAL 500/200 D-3) 500-200 MG-UNIT per tablet Take 1 tablet by mouth 3 (three) times daily.      . cholecalciferol (VITAMIN D) 1000 UNITS tablet Take 1,000 Units by mouth daily. Reported on 04/24/2016    . dexamethasone  (DECADRON) 4 MG tablet Take 10 tabs (40 mg) once weekly while undergoing Velcade injections. 40 tablet 2  . dorzolamide-timolol (COSOPT) 22.3-6.8 MG/ML ophthalmic solution Place 1 drop into the left eye 2 (two) times daily.     Marland Kitchen gabapentin (NEURONTIN) 100 MG capsule Take 1 capsule by mouth 3  times daily 270 capsule 1  . loperamide (IMODIUM A-D) 2 MG tablet Take 2 mg by mouth daily as needed for diarrhea or loose stools. Reported on 07/09/2016    . loratadine (CLARITIN) 10 MG tablet Take 1 tablet (10 mg total) by mouth daily. 30 tablet 5  . LUMIGAN 0.01 % SOLN Place 1 drop into both eyes at bedtime.     . Multiple Vitamins-Minerals (CENTRUM SILVER PO) Take 1 tablet by mouth daily.     . ondansetron (ZOFRAN) 4 MG tablet Take 1 tablet (4 mg total) by mouth every 6 (six) hours. 10 tablet 0  . oxyCODONE-acetaminophen (PERCOCET/ROXICET) 5-325 MG tablet Take 1 tablet by mouth every 8 (eight) hours as needed for severe pain. 40 tablet 0  . pantoprazole (PROTONIX) 40 MG tablet TAKE 1 TABLET BY MOUTH  DAILY FOR ACID REFLUX 90 tablet 1  . potassium chloride SA (K-DUR,KLOR-CON) 20 MEQ tablet TAKE 1 TABLET BY MOUTH  TWICE A DAY 180 tablet 1  . PREMARIN vaginal cream PLACE 1 APPLICATORFUL VAGINALLY TWICE A WEEK. 30 g 0  . temazepam (RESTORIL) 15 MG capsule TAKE 1 CAPSULE AT BEDTIME AS NEEDED FOR SLEEP. 30 capsule 2  . valACYclovir (VALTREX) 1000 MG tablet TAKE 1 TABLET BY MOUTH  DAILY AS NEEDED 90 tablet 0  . valsartan-hydrochlorothiazide (DIOVAN-HCT) 320-25 MG tablet Take 1 tablet by mouth  daily 90 tablet 1   No current facility-administered medications for this visit.      Allergies (verified) Codeine and Tramadol   PAST HISTORY  Family History Family History  Problem Relation Age of Onset  . Kidney failure Mother   . Hypertension Mother   . Prostate cancer Father 2  . Hypertension Father     vascular disease   . Stroke Father   . Hypertension Sister   . Hyperlipidemia Sister   .  Hypertension Brother   . Hyperlipidemia Brother   . Hypertension Sister   . Hypertension Sister   . Colon cancer Neg Hx   . Breast cancer Neg Hx     Social History Social History  Substance Use Topics  . Smoking status: Never Smoker  . Smokeless tobacco: Never Used  . Alcohol use No     Are there smokers in your home (other than you)? No  Risk Factors Current exercise habits: The patient does not participate in regular exercise at present.  Dietary issues discussed: Changes with food labels    Cardiac risk factors: hypertension,   Depression Screen (Note: if answer to either of the following is "Yes", a more complete depression screening is  indicated)   Over the past two weeks, have you felt down, depressed or hopeless? No  Over the past two weeks, have you felt little interest or pleasure in doing things? No  Have you lost interest or pleasure in daily life? No  Do you often feel hopeless? No  Do you cry easily over simple problems? No  Activities of Daily Living In your present state of health, do you have any difficulty performing the following activities?:  Driving? No Managing money?  No Feeding yourself? No Getting from bed to chair? No Climbing a flight of stairs? No Preparing food and eating?: No Bathing or showering? No Getting dressed: No Getting to the toilet? No Using the toilet:No Moving around from place to place: No In the past year have you fallen or had a near fall?:No   Are you sexually active?  No  Do you have more than one partner?  Na  Hearing Difficulties: No Do you often ask people to speak up or repeat themselves? No Do you experience ringing or noises in your ears? No Do you have difficulty understanding soft or whispered voices? No   Do you feel that you have a problem with memory? No  Do you often misplace items? No  Do you feel safe at home?  Yes  Cognitive Testing  Alert? Yes  Normal Appearance?Yes  Oriented to person? Yes   Place? Yes   Time? Yes  Recall of three objects?  Yes  Can perform simple calculations? Yes  Displays appropriate judgment?Yes  Can read the correct time from a watch face?Yes   Advanced Directives have been discussed with the patient? Yes  List the Names of Other Physician/Practitioners you currently use: 1.  Dr. [name] (oncology)  2.  Dr. Venetia Maxon (opthamology)  3.  Dr. Rosendo Gros (general surgeon)  4.  Dr. Skeet Latch (gynecology)   Indicate any recent Medical Services you may have received from other than [facility] providers in the past year (date may be approximate).  Immunization History  Administered Date(s) Administered  . H1N1 12/03/2008, 02/02/2009  . Influenza Split 10/05/2011, 10/27/2012  . Influenza,inj,Quad PF,36+ Mos 09/28/2013, 10/06/2014, 09/26/2015, 08/28/2016  . Pneumococcal Conjugate-13 03/09/2015  . Pneumococcal Polysaccharide-23 04/12/2011  . Tdap 06/27/2011    Screening Tests Health Maintenance  Topic Date Due  . ZOSTAVAX  07/07/2009  . PNA vac Low Risk Adult (2 of 2 - PPSV23) 04/11/2016  . OPHTHALMOLOGY EXAM  12/26/2016  . HEMOGLOBIN A1C  02/23/2017  . FOOT EXAM  08/28/2017  . MAMMOGRAM  08/10/2018  . COLONOSCOPY  12/16/2019  . TETANUS/TDAP  06/26/2021  . INFLUENZA VACCINE  Completed  . DEXA SCAN  Completed  . Hepatitis C Screening  Completed    All answers were reviewed with the patient and necessary referrals were made:  Vanetta Mulders, LPN   30/07/6577   History reviewed: allergies, current medications, past family history, past medical history, past social history, past surgical history and problem list  Review of Systems A comprehensive review of systems was negative.    Objective:     Vision by Snellen chart: right eye:20/20, left eye:20/20  Body mass index is 30.18 kg/m. BP 120/80   Pulse (!) 102   Resp 18   Ht '5\' 6"'  (1.676 m)   Wt 187 lb (84.8 kg)   SpO2 97%   BMI 30.18 kg/m   No exam performed today, annual wellness  without physical exam.     Assessment:      Encounter  for Medicare annual wellness exam Annual exam as documented. Counseling done  re healthy lifestyle involving commitment to 150 minutes exercise per week, heart healthy diet, and attaining healthy weight.The importance of adequate sleep also discussed.  Immunization and cancer screening needs are specifically addressed at this visit.      Plan:     During the course of the visit the patient was educated and counseled about appropriate screening and preventive services including:     Diet review for nutrition referral? Yes ____  Not Indicated __x__   Patient Instructions (the written plan) was given to the patient.  Medicare Attestation I have personally reviewed: The patient's medical and social history Their use of alcohol, tobacco or illicit drugs Their current medications and supplements The patient's functional ability including ADLs,fall risks, home safety risks, cognitive, and hearing and visual impairment Diet and physical activities Evidence for depression or mood disorders  The patient's weight, height, BMI, and visual acuity have been recorded in the chart.  I have made referrals, counseling, and provided education to the patient based on review of the above and I have provided the patient with a written personalized care plan for preventive services.     Denman George [place], [place]   44/05/9506

## 2016-12-02 ENCOUNTER — Telehealth: Payer: Self-pay | Admitting: Family Medicine

## 2016-12-02 DIAGNOSIS — I10 Essential (primary) hypertension: Secondary | ICD-10-CM

## 2016-12-02 DIAGNOSIS — R7301 Impaired fasting glucose: Secondary | ICD-10-CM

## 2016-12-02 DIAGNOSIS — E049 Nontoxic goiter, unspecified: Secondary | ICD-10-CM

## 2016-12-02 DIAGNOSIS — Z0000 Encounter for general adult medical examination without abnormal findings: Secondary | ICD-10-CM | POA: Insufficient documentation

## 2016-12-02 DIAGNOSIS — Z7952 Long term (current) use of systemic steroids: Secondary | ICD-10-CM

## 2016-12-02 NOTE — Telephone Encounter (Signed)
Labs needed 1 week before Feb appt, fasting lipid, cmp and EGFr, hBa1C and TSH please

## 2016-12-02 NOTE — Assessment & Plan Note (Signed)
Annual exam as documented. Counseling done  re healthy lifestyle involving commitment to 150 minutes exercise per week, heart healthy diet, and attaining healthy weight.The importance of adequate sleep also discussed.  Immunization and cancer screening needs are specifically addressed at this visit.  

## 2016-12-03 ENCOUNTER — Ambulatory Visit (HOSPITAL_BASED_OUTPATIENT_CLINIC_OR_DEPARTMENT_OTHER): Payer: Medicare Other

## 2016-12-03 ENCOUNTER — Other Ambulatory Visit (HOSPITAL_BASED_OUTPATIENT_CLINIC_OR_DEPARTMENT_OTHER): Payer: Medicare Other

## 2016-12-03 VITALS — BP 118/63 | HR 100 | Temp 97.8°F | Resp 18

## 2016-12-03 DIAGNOSIS — C9000 Multiple myeloma not having achieved remission: Secondary | ICD-10-CM

## 2016-12-03 DIAGNOSIS — Z5112 Encounter for antineoplastic immunotherapy: Secondary | ICD-10-CM

## 2016-12-03 DIAGNOSIS — C9002 Multiple myeloma in relapse: Secondary | ICD-10-CM

## 2016-12-03 LAB — CBC WITH DIFFERENTIAL/PLATELET
BASO%: 0.1 % (ref 0.0–2.0)
BASOS ABS: 0 10*3/uL (ref 0.0–0.1)
EOS ABS: 0.1 10*3/uL (ref 0.0–0.5)
EOS%: 0.3 % (ref 0.0–7.0)
HEMATOCRIT: 31.9 % — AB (ref 34.8–46.6)
HEMOGLOBIN: 10 g/dL — AB (ref 11.6–15.9)
LYMPH#: 0.5 10*3/uL — AB (ref 0.9–3.3)
LYMPH%: 3 % — ABNORMAL LOW (ref 14.0–49.7)
MCH: 31.2 pg (ref 25.1–34.0)
MCHC: 31.3 g/dL — ABNORMAL LOW (ref 31.5–36.0)
MCV: 99.4 fL (ref 79.5–101.0)
MONO#: 1.9 10*3/uL — ABNORMAL HIGH (ref 0.1–0.9)
MONO%: 12.1 % (ref 0.0–14.0)
NEUT%: 84.5 % — ABNORMAL HIGH (ref 38.4–76.8)
NEUTROS ABS: 13.1 10*3/uL — AB (ref 1.5–6.5)
Platelets: 151 10*3/uL (ref 145–400)
RBC: 3.21 10*6/uL — ABNORMAL LOW (ref 3.70–5.45)
RDW: 15.3 % — AB (ref 11.2–14.5)
WBC: 15.5 10*3/uL — AB (ref 3.9–10.3)

## 2016-12-03 LAB — COMPREHENSIVE METABOLIC PANEL
ALBUMIN: 3 g/dL — AB (ref 3.5–5.0)
ALK PHOS: 88 U/L (ref 40–150)
ALT: 47 U/L (ref 0–55)
AST: 69 U/L — AB (ref 5–34)
Anion Gap: 10 mEq/L (ref 3–11)
BILIRUBIN TOTAL: 0.92 mg/dL (ref 0.20–1.20)
BUN: 21.3 mg/dL (ref 7.0–26.0)
CALCIUM: 10.1 mg/dL (ref 8.4–10.4)
CO2: 26 mEq/L (ref 22–29)
Chloride: 105 mEq/L (ref 98–109)
Creatinine: 1 mg/dL (ref 0.6–1.1)
EGFR: 66 mL/min/{1.73_m2} — AB (ref >90)
GLUCOSE: 121 mg/dL (ref 70–140)
POTASSIUM: 4.2 meq/L (ref 3.5–5.1)
SODIUM: 141 meq/L (ref 136–145)
TOTAL PROTEIN: 6.5 g/dL (ref 6.4–8.3)

## 2016-12-03 MED ORDER — PROCHLORPERAZINE MALEATE 10 MG PO TABS
10.0000 mg | ORAL_TABLET | Freq: Once | ORAL | Status: AC
  Administered 2016-12-03: 10 mg via ORAL

## 2016-12-03 MED ORDER — BORTEZOMIB CHEMO SQ INJECTION 3.5 MG (2.5MG/ML)
1.3000 mg/m2 | Freq: Once | INTRAMUSCULAR | Status: AC
  Administered 2016-12-03: 2.5 mg via SUBCUTANEOUS
  Filled 2016-12-03: qty 2.5

## 2016-12-03 MED ORDER — PROCHLORPERAZINE MALEATE 10 MG PO TABS
ORAL_TABLET | ORAL | Status: AC
  Filled 2016-12-03: qty 1

## 2016-12-03 NOTE — Patient Instructions (Signed)
[facility] Discharge Instructions for Patients Receiving Chemotherapy  Today you received the following chemotherapy agents Velcade. To help prevent nausea and vomiting after your treatment, we encourage you to take your nausea medication as directed.  If you develop nausea and vomiting that is not controlled by your nausea medication, call the clinic.   BELOW ARE SYMPTOMS THAT SHOULD BE REPORTED IMMEDIATELY:  *FEVER GREATER THAN 100.5 F  *CHILLS WITH OR WITHOUT FEVER  NAUSEA AND VOMITING THAT IS NOT CONTROLLED WITH YOUR NAUSEA MEDICATION  *UNUSUAL SHORTNESS OF BREATH  *UNUSUAL BRUISING OR BLEEDING  TENDERNESS IN MOUTH AND THROAT WITH OR WITHOUT PRESENCE OF ULCERS  *URINARY PROBLEMS  *BOWEL PROBLEMS  UNUSUAL RASH Items with * indicate a potential emergency and should be followed up as soon as possible.  Feel free to call the clinic you have any questions or concerns. The clinic phone number is [phone].  Please show the CHEMO ALERT CARD at check-in to the Emergency Department and triage nurse.    

## 2016-12-10 ENCOUNTER — Encounter: Payer: Self-pay | Admitting: Internal Medicine

## 2016-12-10 ENCOUNTER — Ambulatory Visit (HOSPITAL_BASED_OUTPATIENT_CLINIC_OR_DEPARTMENT_OTHER): Payer: Medicare Other | Admitting: Internal Medicine

## 2016-12-10 ENCOUNTER — Other Ambulatory Visit (HOSPITAL_BASED_OUTPATIENT_CLINIC_OR_DEPARTMENT_OTHER): Payer: Medicare Other

## 2016-12-10 ENCOUNTER — Telehealth: Payer: Self-pay | Admitting: Internal Medicine

## 2016-12-10 ENCOUNTER — Other Ambulatory Visit: Payer: Self-pay | Admitting: Medical Oncology

## 2016-12-10 ENCOUNTER — Ambulatory Visit (HOSPITAL_BASED_OUTPATIENT_CLINIC_OR_DEPARTMENT_OTHER): Payer: Medicare Other

## 2016-12-10 VITALS — BP 126/64 | HR 109 | Temp 98.3°F | Resp 18 | Ht 66.0 in | Wt 189.2 lb

## 2016-12-10 DIAGNOSIS — Z5112 Encounter for antineoplastic immunotherapy: Secondary | ICD-10-CM

## 2016-12-10 DIAGNOSIS — C9002 Multiple myeloma in relapse: Secondary | ICD-10-CM | POA: Diagnosis not present

## 2016-12-10 DIAGNOSIS — C9000 Multiple myeloma not having achieved remission: Secondary | ICD-10-CM

## 2016-12-10 DIAGNOSIS — Z9484 Stem cells transplant status: Secondary | ICD-10-CM | POA: Diagnosis not present

## 2016-12-10 DIAGNOSIS — Z5111 Encounter for antineoplastic chemotherapy: Secondary | ICD-10-CM

## 2016-12-10 LAB — COMPREHENSIVE METABOLIC PANEL
ALT: 21 U/L (ref 0–55)
ANION GAP: 9 meq/L (ref 3–11)
AST: 16 U/L (ref 5–34)
Albumin: 3 g/dL — ABNORMAL LOW (ref 3.5–5.0)
Alkaline Phosphatase: 79 U/L (ref 40–150)
BUN: 14.6 mg/dL (ref 7.0–26.0)
CALCIUM: 9.8 mg/dL (ref 8.4–10.4)
CHLORIDE: 105 meq/L (ref 98–109)
CO2: 27 meq/L (ref 22–29)
Creatinine: 0.9 mg/dL (ref 0.6–1.1)
EGFR: 77 mL/min/{1.73_m2} — ABNORMAL LOW (ref >90)
Glucose: 130 mg/dl (ref 70–140)
POTASSIUM: 4 meq/L (ref 3.5–5.1)
Sodium: 142 mEq/L (ref 136–145)
Total Bilirubin: 1.5 mg/dL — ABNORMAL HIGH (ref 0.20–1.20)
Total Protein: 6.4 g/dL (ref 6.4–8.3)

## 2016-12-10 LAB — CBC WITH DIFFERENTIAL/PLATELET
BASO%: 0.2 % (ref 0.0–2.0)
BASOS ABS: 0 10*3/uL (ref 0.0–0.1)
EOS%: 0.3 % (ref 0.0–7.0)
Eosinophils Absolute: 0 10*3/uL (ref 0.0–0.5)
HEMATOCRIT: 31.8 % — AB (ref 34.8–46.6)
HGB: 9.9 g/dL — ABNORMAL LOW (ref 11.6–15.9)
LYMPH#: 0.8 10*3/uL — AB (ref 0.9–3.3)
LYMPH%: 4.4 % — AB (ref 14.0–49.7)
MCH: 30.8 pg (ref 25.1–34.0)
MCHC: 31.1 g/dL — AB (ref 31.5–36.0)
MCV: 99.1 fL (ref 79.5–101.0)
MONO#: 2.1 10*3/uL — ABNORMAL HIGH (ref 0.1–0.9)
MONO%: 12.5 % (ref 0.0–14.0)
NEUT#: 14.1 10*3/uL — ABNORMAL HIGH (ref 1.5–6.5)
NEUT%: 82.6 % — AB (ref 38.4–76.8)
Platelets: 142 10*3/uL — ABNORMAL LOW (ref 145–400)
RBC: 3.21 10*6/uL — AB (ref 3.70–5.45)
RDW: 17.1 % — ABNORMAL HIGH (ref 11.2–14.5)
WBC: 17 10*3/uL — ABNORMAL HIGH (ref 3.9–10.3)

## 2016-12-10 MED ORDER — OXYCODONE-ACETAMINOPHEN 5-325 MG PO TABS: 1.0000 | ORAL_TABLET | Freq: Three times a day (TID) | ORAL | 0 refills | Status: DC | PRN

## 2016-12-10 MED ORDER — BORTEZOMIB CHEMO SQ INJECTION 3.5 MG (2.5MG/ML)
1.3000 mg/m2 | Freq: Once | INTRAMUSCULAR | Status: AC
  Administered 2016-12-10: 2.5 mg via SUBCUTANEOUS
  Filled 2016-12-10: qty 2.5

## 2016-12-10 MED ORDER — ONDANSETRON HCL 4 MG PO TABS: 4.0000 mg | ORAL_TABLET | Freq: Four times a day (QID) | ORAL | 0 refills | Status: DC

## 2016-12-10 MED ORDER — PROCHLORPERAZINE MALEATE 10 MG PO TABS
ORAL_TABLET | ORAL | Status: AC
  Filled 2016-12-10: qty 1

## 2016-12-10 MED ORDER — PROCHLORPERAZINE MALEATE 10 MG PO TABS
10.0000 mg | ORAL_TABLET | Freq: Once | ORAL | Status: AC
  Administered 2016-12-10: 10 mg via ORAL

## 2016-12-10 NOTE — Telephone Encounter (Signed)
Error entry

## 2016-12-10 NOTE — Progress Notes (Signed)
[facility] [phone]   [phone]  OFFICE PROGRESS NOTE  [name], MD [address] [place] [address]  DIAGNOSIS: Stage II a high IgA kappa multiple myeloma diagnosed in 2006.  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  2) the patient had evidence for disease recurrence in August of 2008 and she was treated with 6 cycles of Velcade completed on 03/30/2008 with response to the treatment but was discontinued secondary to neuropathy.  3) maintenance treatment with Revlimid 10 mg by mouth daily by the does was later increase to 15 mg by mouth daily secondary to biochemical recurrence with stabilization of her disease. The treatment with Revlimid 15 mg by mouth daily was discontinued secondary to evidence for disease progression. 4) Revlimid 25 mg by mouth daily for 3 weeks every 4 weeks in addition to Decadron currently at 20 mg by mouth on a weekly basis. She is status post 12 cycles. This was discontinued secondary to disease progression. 5) systemic chemotherapy with Daratumumab 16 MG/KG weekly for the first 9 weeks, Velcade 1.3 MG/M2 subcutaneously on days 1, 4, 8 and 11 every 3 weeks in addition to Decadron 20 mg by mouth on days 1, 2, 4, 5, 8, 9, 11 and 12 every 3 weeks. First dose 05/07/2016. Status post one cycle discontinued secondary to intolerance.  CURRENT THERAPY: 1) systemic chemotherapy with Velcade 1.3 MG/M2 subcutaneously weekly in addition to Decadron 40 mg by mouth daily weekly status post 24 cycles. 2) Zometa 4 mg IV every 3 months. First dose 05/05/2015.  INTERVAL HISTORY: Darlene Howard 65 y.o. female returns to the clinic today for follow-up visit accompanied by her sister-in-law. The patient is feeling fine today with no specific complaints. She continues to  tolerate her treatment with Velcade and Decadron fairly well. She has no weight loss or night sweats. She has no nausea or vomiting. She denied having any peripheral neuropathy. She is here today to start cycle #25 of her treatment.  MEDICAL HISTORY: Past Medical History:  Diagnosis Date  . Bruises easily    d/t being on COumadin  . Cholelithiasis   . DDD (degenerative disc disease), lumbar   . Fatty liver   . GERD (gastroesophageal reflux disease)    takes Pantoprazole daily  . Glaucoma    uses eye drops as instructed  . H/O stem cell transplant ([place])    04-26-2006  AT [facility]  . Herpes    takes Valtrex as needed  . History of colon polyps   . Hypertension    takes Amlodipine and Diovan daily  . Lung cancer ([place])    chest wall lesion aadn 6th rib  . Multiple myeloma DX  2006  STATE IIA,  IgA  KAPPA   STABLE  PER DR Osmel Dykstra--  S/P STEM CELL TRANSPLANT 2007 /  RECURRENCE 2008  CHEMO ENDED 03-30-2008/   NOW ON MAINTANANCE REVLIMID )  . 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 Personal history of colon cancer    ASCENDING COLON--  S/P  RIGHT HEMICOLECTOMY WITH NEGATIVE NODES/    NO RECURRENCE  . Type 2 diabetes mellitus ([place])   . Vocal cord nodule    resolved- see Dr. Deeann Saint notes in media tab 06/17/14    ALLERGIES:  is allergic  to codeine and tramadol.  MEDICATIONS:  Current Outpatient Prescriptions  Medication Sig Dispense Refill  . acetaminophen (TYLENOL) 500 MG tablet Take 500 mg by mouth daily as needed for moderate pain. Reported on 06/25/2016    . aspirin (ASPIRIN LOW DOSE) 81 MG EC tablet Take 81 mg by mouth daily.      . calcium-vitamin D (OSCAL 500/200 D-3) 500-200 MG-UNIT per tablet Take 1 tablet by mouth 3 (three) times daily.      . cholecalciferol (VITAMIN D) 1000 UNITS tablet Take 1,000 Units by mouth daily. Reported on 04/24/2016    . dexamethasone (DECADRON) 4 MG tablet Take 10 tabs (40 mg) once weekly while  undergoing Velcade injections. 40 tablet 2  . dorzolamide-timolol (COSOPT) 22.3-6.8 MG/ML ophthalmic solution Place 1 drop into the left eye 2 (two) times daily.     Marland Kitchen gabapentin (NEURONTIN) 100 MG capsule Take 1 capsule by mouth 3  times daily 270 capsule 1  . loperamide (IMODIUM A-D) 2 MG tablet Take 2 mg by mouth daily as needed for diarrhea or loose stools. Reported on 07/09/2016    . loratadine (CLARITIN) 10 MG tablet Take 1 tablet (10 mg total) by mouth daily. 30 tablet 5  . LUMIGAN 0.01 % SOLN Place 1 drop into both eyes at bedtime.     . Multiple Vitamins-Minerals (CENTRUM SILVER PO) Take 1 tablet by mouth daily.     . pantoprazole (PROTONIX) 40 MG tablet TAKE 1 TABLET BY MOUTH  DAILY FOR ACID REFLUX 90 tablet 1  . potassium chloride SA (K-DUR,KLOR-CON) 20 MEQ tablet TAKE 1 TABLET BY MOUTH  TWICE A DAY 180 tablet 1  . PREMARIN vaginal cream PLACE 1 APPLICATORFUL VAGINALLY TWICE A WEEK. 30 g 0  . temazepam (RESTORIL) 15 MG capsule TAKE 1 CAPSULE AT BEDTIME AS NEEDED FOR SLEEP. 30 capsule 2  . valACYclovir (VALTREX) 1000 MG tablet TAKE 1 TABLET BY MOUTH  DAILY AS NEEDED 90 tablet 0  . valsartan-hydrochlorothiazide (DIOVAN-HCT) 320-25 MG tablet Take 1 tablet by mouth  daily 90 tablet 1  . ondansetron (ZOFRAN) 4 MG tablet Take 1 tablet (4 mg total) by mouth every 6 (six) hours. 20 tablet 0  . oxyCODONE-acetaminophen (PERCOCET/ROXICET) 5-325 MG tablet Take 1 tablet by mouth every 8 (eight) hours as needed for severe pain. 40 tablet 0   No current facility-administered medications for this visit.     SURGICAL HISTORY:  Past Surgical History:  Procedure Laterality Date  . ABDOMINAL HYSTERECTOMY  1983   w/  appendectomy and left salpingoophorectom  . CATARACT EXTRACTION W/PHACO Left 01/20/2014   Procedure: CATARACT EXTRACTION PHACO AND INTRAOCULAR LENS PLACEMENT (IOC);  Surgeon: Adonis Brook, MD;  Location: [facility];  Service: Ophthalmology;  Laterality: Left;  . CERVICAL CONE BIOPSY    .  CHOLECYSTECTOMY N/A 02/28/2016   Procedure: LAPAROSCOPIC CHOLECYSTECTOMY;  Surgeon: Ralene Ok, MD;  Location: [facility];  Service: General;  Laterality: N/A;  . CO2  ABLATION VULVA PERIANAL AND VAGINAL DYSPLATIC AREAS  02-15-2010  . COLONOSCOPY  10/07/2006   PPI:RJJOAC rectum/Status post right hemicolectomy. Normal residual colon  . COLONOSCOPY  10/2009   RMR: diminutive RS polyp (hyperplastic).   . COLONOSCOPY N/A 12/15/2014   Procedure: COLONOSCOPY;  Surgeon: Daneil Dolin, MD;  [facility];  Service: Endoscopy;  Laterality: N/A;  130   . ESOPHAGOGASTRODUODENOSCOPY  04/02/2005   ZYS:[id number] polyp with central erosion versus ulceration in the body of the stomach, resected and recovered Remainder of the gastric mucosa,  D1, D2 appeared normal  . HEMICOLECTOMY Right 04-27-2005   CARCINOMA   ASCENDING COLON  . LESION REMOVAL N/A 05/06/2014   Procedure: LASER OF THE VAGINA;  Surgeon: Janie Morning, MD;  Location: [facility];  Service: Gynecology;  Laterality: N/A;  . LIMBAL STEM CELL TRANSPLANT  04-26-2006    ([facility])  . VULVAR LESION REMOVAL N/A 05/06/2014   Procedure: LASER OF VULVAR LESION;  Surgeon: Janie Morning, MD;  Location: [facility];  Service: Gynecology;  Laterality: N/A;  . VULVECTOMY N/A 05/06/2014   Procedure: WIDE LOCAL EXCISION LEFT LABIA MAJORA;  Surgeon: Janie Morning, MD;  Location: [facility];  Service: Gynecology;  Laterality: N/A;    REVIEW OF SYSTEMS:  A comprehensive review of systems was negative except for: Musculoskeletal: positive for arthralgias   PHYSICAL EXAMINATION: General appearance: alert, cooperative and no distress Head: Normocephalic, without obvious abnormality, atraumatic Neck: no adenopathy, no JVD, supple, symmetrical, trachea midline and thyroid not enlarged, symmetric, no tenderness/mass/nodules Lymph nodes: Cervical, supraclavicular, and axillary nodes normal. Resp: clear to  auscultation bilaterally Back: symmetric, no curvature. ROM normal. No CVA tenderness. Cardio: regular rate and rhythm, S1, S2 normal, no murmur, click, rub or gallop GI: soft, non-tender; bowel sounds normal; no masses,  no organomegaly Extremities: extremities normal, atraumatic, no cyanosis or edema  ECOG PERFORMANCE STATUS: 1 - Symptomatic but completely ambulatory  Blood pressure 126/64, pulse (!) 109, temperature 98.3 F (36.8 C), temperature source Oral, resp. rate 18, height '5\' 6"'  (1.676 m), weight 189 lb 3.2 oz (85.8 kg), SpO2 99 %.  LABORATORY DATA: Lab Results  Component Value Date   WBC 17.0 (H) 12/10/2016   HGB 9.9 (L) 12/10/2016   HCT 31.8 (L) 12/10/2016   MCV 99.1 12/10/2016   PLT 142 (L) 12/10/2016      Chemistry      Component Value Date/Time   NA 142 12/10/2016 1133   K 4.0 12/10/2016 1133   CL 110 08/23/2016 1000   CL 105 06/17/2013 1441   CO2 27 12/10/2016 1133   BUN 14.6 12/10/2016 1133   CREATININE 0.9 12/10/2016 1133      Component Value Date/Time   CALCIUM 9.8 12/10/2016 1133   ALKPHOS 79 12/10/2016 1133   AST 16 12/10/2016 1133   ALT 21 12/10/2016 1133   BILITOT 1.50 (H) 12/10/2016 1133       RADIOGRAPHIC STUDIES: No results found.  ASSESSMENT AND PLAN: This is a very pleasant 65 years old African-American female with recurrent multiple myeloma. She is currently on treatment with Velcade and Decadron status post 24 weekly doses. She is tolerating the treatment well with no significant adverse effects. I recommended for the patient to proceed with cycle #25 today as a scheduled. I will see her back for follow-up visit in 3 weeks for evaluation. For pain management, I gave the patient refill of Vicodin. The patient voices understanding of current disease status and treatment options and is in agreement with the current care plan.  All questions were answered. The patient knows to call the clinic with any problems, questions or concerns. We can  certainly see the patient much sooner if necessary.  I spent 10 minutes counseling the patient face to face. The total time spent in the appointment was 15 minutes.  Disclaimer: This note was dictated with voice recognition software. Similar sounding words can inadvertently be transcribed and may not be corrected upon review.

## 2016-12-10 NOTE — Patient Instructions (Signed)
[facility] Discharge Instructions for Patients Receiving Chemotherapy  Today you received the following chemotherapy agents Velcade. To help prevent nausea and vomiting after your treatment, we encourage you to take your nausea medication as directed.  If you develop nausea and vomiting that is not controlled by your nausea medication, call the clinic.   BELOW ARE SYMPTOMS THAT SHOULD BE REPORTED IMMEDIATELY:  *FEVER GREATER THAN 100.5 F  *CHILLS WITH OR WITHOUT FEVER  NAUSEA AND VOMITING THAT IS NOT CONTROLLED WITH YOUR NAUSEA MEDICATION  *UNUSUAL SHORTNESS OF BREATH  *UNUSUAL BRUISING OR BLEEDING  TENDERNESS IN MOUTH AND THROAT WITH OR WITHOUT PRESENCE OF ULCERS  *URINARY PROBLEMS  *BOWEL PROBLEMS  UNUSUAL RASH Items with * indicate a potential emergency and should be followed up as soon as possible.  Feel free to call the clinic you have any questions or concerns. The clinic phone number is [phone].  Please show the CHEMO ALERT CARD at check-in to the Emergency Department and triage nurse.    

## 2016-12-10 NOTE — Telephone Encounter (Signed)
Message sent to chemo scheduler to be added per 12/10/16 los. Appointments scheduled per 12/10/16 los. A copy of the appointment schedule was given to the patient, per 12/10/16 los.

## 2016-12-10 NOTE — Telephone Encounter (Signed)
Labs ordered and mailed to patient's address on file.

## 2016-12-10 NOTE — Addendum Note (Signed)
Addended by: Denman George B on: 12/10/2016 05:12 PM   Modules accepted: Orders

## 2016-12-11 ENCOUNTER — Telehealth: Payer: Self-pay | Admitting: *Deleted

## 2016-12-11 DIAGNOSIS — E1165 Type 2 diabetes mellitus with hyperglycemia: Secondary | ICD-10-CM | POA: Diagnosis not present

## 2016-12-11 DIAGNOSIS — B351 Tinea unguium: Secondary | ICD-10-CM | POA: Diagnosis not present

## 2016-12-11 NOTE — Telephone Encounter (Signed)
Per LOS I have scheduled appts and notified the scheduler 

## 2016-12-12 ENCOUNTER — Other Ambulatory Visit: Payer: Self-pay | Admitting: Family Medicine

## 2016-12-17 ENCOUNTER — Other Ambulatory Visit (HOSPITAL_BASED_OUTPATIENT_CLINIC_OR_DEPARTMENT_OTHER): Payer: Medicare Other

## 2016-12-17 ENCOUNTER — Ambulatory Visit (HOSPITAL_BASED_OUTPATIENT_CLINIC_OR_DEPARTMENT_OTHER): Payer: Medicare Other

## 2016-12-17 VITALS — BP 121/73 | HR 100 | Temp 96.9°F | Resp 18

## 2016-12-17 DIAGNOSIS — Z5112 Encounter for antineoplastic immunotherapy: Secondary | ICD-10-CM

## 2016-12-17 DIAGNOSIS — C9002 Multiple myeloma in relapse: Secondary | ICD-10-CM

## 2016-12-17 LAB — CBC WITH DIFFERENTIAL/PLATELET
BASO%: 0.3 % (ref 0.0–2.0)
Basophils Absolute: 0 10*3/uL (ref 0.0–0.1)
EOS ABS: 0.1 10*3/uL (ref 0.0–0.5)
EOS%: 0.4 % (ref 0.0–7.0)
HCT: 32.6 % — ABNORMAL LOW (ref 34.8–46.6)
HGB: 10.1 g/dL — ABNORMAL LOW (ref 11.6–15.9)
LYMPH%: 5.2 % — AB (ref 14.0–49.7)
MCH: 31.2 pg (ref 25.1–34.0)
MCHC: 31.1 g/dL — AB (ref 31.5–36.0)
MCV: 100.1 fL (ref 79.5–101.0)
MONO#: 1.4 10*3/uL — AB (ref 0.1–0.9)
MONO%: 9.9 % (ref 0.0–14.0)
NEUT#: 12 10*3/uL — ABNORMAL HIGH (ref 1.5–6.5)
NEUT%: 84.2 % — AB (ref 38.4–76.8)
PLATELETS: 157 10*3/uL (ref 145–400)
RBC: 3.25 10*6/uL — AB (ref 3.70–5.45)
RDW: 17 % — ABNORMAL HIGH (ref 11.2–14.5)
WBC: 14.3 10*3/uL — ABNORMAL HIGH (ref 3.9–10.3)
lymph#: 0.7 10*3/uL — ABNORMAL LOW (ref 0.9–3.3)

## 2016-12-17 LAB — COMPREHENSIVE METABOLIC PANEL
ALT: 17 U/L (ref 0–55)
ANION GAP: 8 meq/L (ref 3–11)
AST: 18 U/L (ref 5–34)
Albumin: 3.1 g/dL — ABNORMAL LOW (ref 3.5–5.0)
Alkaline Phosphatase: 67 U/L (ref 40–150)
BUN: 14.6 mg/dL (ref 7.0–26.0)
CHLORIDE: 107 meq/L (ref 98–109)
CO2: 27 meq/L (ref 22–29)
Calcium: 10.2 mg/dL (ref 8.4–10.4)
Creatinine: 0.9 mg/dL (ref 0.6–1.1)
EGFR: 77 mL/min/{1.73_m2} — AB (ref >90)
Glucose: 118 mg/dl (ref 70–140)
POTASSIUM: 4.1 meq/L (ref 3.5–5.1)
Sodium: 142 mEq/L (ref 136–145)
Total Bilirubin: 1.09 mg/dL (ref 0.20–1.20)
Total Protein: 6.5 g/dL (ref 6.4–8.3)

## 2016-12-17 MED ORDER — PROCHLORPERAZINE MALEATE 10 MG PO TABS
10.0000 mg | ORAL_TABLET | Freq: Once | ORAL | Status: AC
  Administered 2016-12-17: 10 mg via ORAL

## 2016-12-17 MED ORDER — PROCHLORPERAZINE MALEATE 10 MG PO TABS
ORAL_TABLET | ORAL | Status: AC
  Filled 2016-12-17: qty 1

## 2016-12-17 MED ORDER — BORTEZOMIB CHEMO SQ INJECTION 3.5 MG (2.5MG/ML)
1.3000 mg/m2 | Freq: Once | INTRAMUSCULAR | Status: AC
  Administered 2016-12-17: 2.5 mg via SUBCUTANEOUS
  Filled 2016-12-17: qty 2.5

## 2016-12-17 NOTE — Patient Instructions (Signed)
[facility] Discharge Instructions for Patients Receiving Chemotherapy  Today you received the following chemotherapy agents Velcade. To help prevent nausea and vomiting after your treatment, we encourage you to take your nausea medication as directed.  If you develop nausea and vomiting that is not controlled by your nausea medication, call the clinic.   BELOW ARE SYMPTOMS THAT SHOULD BE REPORTED IMMEDIATELY:  *FEVER GREATER THAN 100.5 F  *CHILLS WITH OR WITHOUT FEVER  NAUSEA AND VOMITING THAT IS NOT CONTROLLED WITH YOUR NAUSEA MEDICATION  *UNUSUAL SHORTNESS OF BREATH  *UNUSUAL BRUISING OR BLEEDING  TENDERNESS IN MOUTH AND THROAT WITH OR WITHOUT PRESENCE OF ULCERS  *URINARY PROBLEMS  *BOWEL PROBLEMS  UNUSUAL RASH Items with * indicate a potential emergency and should be followed up as soon as possible.  Feel free to call the clinic you have any questions or concerns. The clinic phone number is [phone].  Please show the CHEMO ALERT CARD at check-in to the Emergency Department and triage nurse.    

## 2016-12-18 ENCOUNTER — Telehealth: Payer: Self-pay | Admitting: Family Medicine

## 2016-12-18 ENCOUNTER — Other Ambulatory Visit: Payer: Self-pay | Admitting: Family Medicine

## 2016-12-18 MED ORDER — BENZONATATE 100 MG PO CAPS: 100.0000 mg | ORAL_CAPSULE | Freq: Two times a day (BID) | ORAL | 0 refills | Status: DC | PRN

## 2016-12-18 NOTE — Telephone Encounter (Signed)
Patient aware.  Will call back if she develops and additional symptoms.

## 2016-12-18 NOTE — Telephone Encounter (Signed)
Graceanna is asking if Dr. Moshe Cipro would send her something in for a scratchy throat, it started yesterday denies fever, please advise?

## 2016-12-18 NOTE — Telephone Encounter (Signed)
Advise salt water gargles, use of oTC allergy med like claritin daily if on none, I have sent tessalon perles for cough if needed, call back if fevber, chills , or discolored sputum , I am aware that her immune function is compromised

## 2016-12-18 NOTE — Telephone Encounter (Signed)
Please advise 

## 2016-12-25 ENCOUNTER — Other Ambulatory Visit (HOSPITAL_BASED_OUTPATIENT_CLINIC_OR_DEPARTMENT_OTHER): Payer: Medicare Other

## 2016-12-25 ENCOUNTER — Ambulatory Visit (HOSPITAL_BASED_OUTPATIENT_CLINIC_OR_DEPARTMENT_OTHER): Payer: Medicare Other

## 2016-12-25 VITALS — BP 130/69 | HR 100 | Temp 97.9°F | Resp 18

## 2016-12-25 DIAGNOSIS — Z5112 Encounter for antineoplastic immunotherapy: Secondary | ICD-10-CM

## 2016-12-25 DIAGNOSIS — C9002 Multiple myeloma in relapse: Secondary | ICD-10-CM

## 2016-12-25 LAB — COMPREHENSIVE METABOLIC PANEL
ALBUMIN: 3.2 g/dL — AB (ref 3.5–5.0)
ALK PHOS: 71 U/L (ref 40–150)
ALT: 18 U/L (ref 0–55)
AST: 18 U/L (ref 5–34)
Anion Gap: 8 mEq/L (ref 3–11)
BUN: 14.7 mg/dL (ref 7.0–26.0)
CO2: 28 mEq/L (ref 22–29)
Calcium: 10.1 mg/dL (ref 8.4–10.4)
Chloride: 110 mEq/L — ABNORMAL HIGH (ref 98–109)
Creatinine: 0.8 mg/dL (ref 0.6–1.1)
EGFR: 87 mL/min/{1.73_m2} — ABNORMAL LOW (ref >90)
GLUCOSE: 103 mg/dL (ref 70–140)
POTASSIUM: 4.2 meq/L (ref 3.5–5.1)
SODIUM: 145 meq/L (ref 136–145)
TOTAL PROTEIN: 6.6 g/dL (ref 6.4–8.3)
Total Bilirubin: 1.08 mg/dL (ref 0.20–1.20)

## 2016-12-25 LAB — CBC WITH DIFFERENTIAL/PLATELET
BASO%: 0.2 % (ref 0.0–2.0)
Basophils Absolute: 0 10*3/uL (ref 0.0–0.1)
EOS%: 0.9 % (ref 0.0–7.0)
Eosinophils Absolute: 0.1 10*3/uL (ref 0.0–0.5)
HEMATOCRIT: 33 % — AB (ref 34.8–46.6)
HEMOGLOBIN: 10.4 g/dL — AB (ref 11.6–15.9)
LYMPH#: 0.7 10*3/uL — AB (ref 0.9–3.3)
LYMPH%: 4.8 % — ABNORMAL LOW (ref 14.0–49.7)
MCH: 31.8 pg (ref 25.1–34.0)
MCHC: 31.5 g/dL (ref 31.5–36.0)
MCV: 100.9 fL (ref 79.5–101.0)
MONO#: 1.6 10*3/uL — ABNORMAL HIGH (ref 0.1–0.9)
MONO%: 11.7 % (ref 0.0–14.0)
NEUT%: 82.4 % — ABNORMAL HIGH (ref 38.4–76.8)
NEUTROS ABS: 11.5 10*3/uL — AB (ref 1.5–6.5)
Platelets: 164 10*3/uL (ref 145–400)
RBC: 3.27 10*6/uL — ABNORMAL LOW (ref 3.70–5.45)
RDW: 15.3 % — AB (ref 11.2–14.5)
WBC: 14 10*3/uL — AB (ref 3.9–10.3)

## 2016-12-25 MED ORDER — BORTEZOMIB CHEMO SQ INJECTION 3.5 MG (2.5MG/ML)
1.3000 mg/m2 | Freq: Once | INTRAMUSCULAR | Status: AC
  Administered 2016-12-25: 2.5 mg via SUBCUTANEOUS
  Filled 2016-12-25: qty 2.5

## 2016-12-25 MED ORDER — PROCHLORPERAZINE MALEATE 10 MG PO TABS
ORAL_TABLET | ORAL | Status: AC
  Filled 2016-12-25: qty 1

## 2016-12-25 MED ORDER — PROCHLORPERAZINE MALEATE 10 MG PO TABS
10.0000 mg | ORAL_TABLET | Freq: Once | ORAL | Status: AC
  Administered 2016-12-25: 10 mg via ORAL

## 2016-12-25 NOTE — Patient Instructions (Signed)
[facility] Discharge Instructions for Patients Receiving Chemotherapy  Today you received the following chemotherapy agents Velcade. To help prevent nausea and vomiting after your treatment, we encourage you to take your nausea medication as directed.  If you develop nausea and vomiting that is not controlled by your nausea medication, call the clinic.   BELOW ARE SYMPTOMS THAT SHOULD BE REPORTED IMMEDIATELY:  *FEVER GREATER THAN 100.5 F  *CHILLS WITH OR WITHOUT FEVER  NAUSEA AND VOMITING THAT IS NOT CONTROLLED WITH YOUR NAUSEA MEDICATION  *UNUSUAL SHORTNESS OF BREATH  *UNUSUAL BRUISING OR BLEEDING  TENDERNESS IN MOUTH AND THROAT WITH OR WITHOUT PRESENCE OF ULCERS  *URINARY PROBLEMS  *BOWEL PROBLEMS  UNUSUAL RASH Items with * indicate a potential emergency and should be followed up as soon as possible.  Feel free to call the clinic you have any questions or concerns. The clinic phone number is [phone].  Please show the CHEMO ALERT CARD at check-in to the Emergency Department and triage nurse.    

## 2016-12-28 ENCOUNTER — Encounter: Payer: Self-pay | Admitting: Family Medicine

## 2016-12-28 ENCOUNTER — Ambulatory Visit (INDEPENDENT_AMBULATORY_CARE_PROVIDER_SITE_OTHER): Payer: Medicare Other | Admitting: Family Medicine

## 2016-12-28 VITALS — BP 128/82 | HR 101 | Temp 98.4°F | Resp 16 | Ht 66.0 in | Wt 187.0 lb

## 2016-12-28 DIAGNOSIS — J0100 Acute maxillary sinusitis, unspecified: Secondary | ICD-10-CM

## 2016-12-28 DIAGNOSIS — I10 Essential (primary) hypertension: Secondary | ICD-10-CM

## 2016-12-28 DIAGNOSIS — J209 Acute bronchitis, unspecified: Secondary | ICD-10-CM

## 2016-12-28 MED ORDER — PROMETHAZINE-DM 6.25-15 MG/5ML PO SYRP: ORAL_SOLUTION | ORAL | 0 refills | Status: DC

## 2016-12-28 MED ORDER — BENZONATATE 100 MG PO CAPS: 100.0000 mg | ORAL_CAPSULE | Freq: Three times a day (TID) | ORAL | 0 refills | Status: DC | PRN

## 2016-12-28 MED ORDER — CEFTRIAXONE SODIUM 500 MG IJ SOLR
500.0000 mg | Freq: Once | INTRAMUSCULAR | Status: AC
  Administered 2016-12-28: 500 mg via INTRAMUSCULAR

## 2016-12-28 MED ORDER — PENICILLIN V POTASSIUM 500 MG PO TABS: 500.0000 mg | ORAL_TABLET | Freq: Three times a day (TID) | ORAL | 0 refills | Status: DC

## 2016-12-28 NOTE — Progress Notes (Signed)
   Darlene Howard     MRN: [id number]      DOB: July 30, 1951   HPI Ms. Harpole  1 week h/o worsening head and chest congestion, associated with no  fever or chills . Nasal drainage has thickened , and is yellowish green, and at times bloody. Sputum is thick and yellow. C/o bilateral ear pressure, denies hearing loss and sore throat. Increasing fatigue , poor appetitie and sleep disturbed by cough. No improvement with OTC medication.   ROS Denies chest pains, palpitations and leg swelling Denies abdominal pain, nausea, vomiting,diarrhea or constipation.   Denies dysuria, frequency, hesitancy or incontinence. Denies joint pain, swelling and limitation in mobility. Denies headaches, seizures, numbness, or tingling. Denies depression, anxiety or insomnia. Denies skin break down or rash.   PE  BP 128/82   Pulse (!) 101   Temp 98.4 F (36.9 C) (Oral)   Resp 16   Ht '5\' 6"'$  (1.676 m)   Wt 187 lb (84.8 kg)   SpO2 97%   BMI 30.18 kg/m   Patient alert and oriented and in no cardiopulmonary distress.  HEENT: No facial asymmetry, EOMI,   oropharynx pink and moist.  Neck supple no JVD, no mass.TM clear bilaterally, maxillary sinus tenderness Chest: adequate air entry, scattered crackled and few wheezes  CVS: S1, S2 no murmurs, no S3.Regular rate.  ABD: Soft non tender.   Ext: No edema  MS: Adequate ROM spine, shoulders, hips and knees.  Skin: Intact, no ulcerations or rash noted.  Psych: Good eye contact, normal affect. Memory intact not anxious or depressed appearing.  CNS: CN 2-12 intact, power,  normal throughout.no focal deficits noted.   Assessment & Plan  Acute bronchitis 1 week h/o worsening chest congestion and sinus infection Rocephin in office  Tessalon perle and pen v x 1 10 days   Maxillary sinusitis, acute Rocephin administered  and course of penicillin prescribed  Essential hypertension Controlled, no change in medication DASH diet and commitment to daily  physical activity for a minimum of 30 minutes discussed and encouraged, as a part of hypertension management. The importance of attaining a healthy weight is also discussed.  BP/Weight 12/28/2016 12/25/2016 12/17/2016 12/10/2016 12/03/2016 11/29/2016 40/98/1191  Systolic BP 478 295 621 308 [phone]  Diastolic BP 82 69 73 64 63 80 64  Wt. (Lbs) 187 - - 189.2 - 187 -  BMI 30.18 - - 30.54 - 30.18 -

## 2016-12-28 NOTE — Patient Instructions (Signed)
F/u as before, call if you need me sooner  Rocephin in office for acute bronchitis and sinusitis, and penicillin, tessalon perles, and phenergan DM are sent to your pharmacy  Rest , fluids, medication wand chicken soup will all get you better!  Thank you  for choosing [facility]. We consider it a privelige to serve you.  Delivering excellent health care in a caring and  compassionate way is our goal.  Partnering with you,  so that together we can achieve this goal is our strategy.

## 2016-12-28 NOTE — Assessment & Plan Note (Signed)
Controlled, no change in medication DASH diet and commitment to daily physical activity for a minimum of 30 minutes discussed and encouraged, as a part of hypertension management. The importance of attaining a healthy weight is also discussed.  BP/Weight 12/28/2016 12/25/2016 12/17/2016 12/10/2016 12/03/2016 11/29/2016 76/81/1572  Systolic BP 620 [phone]  Diastolic BP 82 69 73 64 63 80 64  Wt. (Lbs) 187 - - 189.2 - 187 -  BMI 30.18 - - 30.54 - 30.18 -

## 2016-12-28 NOTE — Assessment & Plan Note (Signed)
Rocephin administered  and course of penicillin prescribed

## 2016-12-28 NOTE — Assessment & Plan Note (Signed)
1 week h/o worsening chest congestion and sinus infection Rocephin in office  Tessalon perle and pen v x 1 10 days

## 2017-01-01 ENCOUNTER — Ambulatory Visit (HOSPITAL_BASED_OUTPATIENT_CLINIC_OR_DEPARTMENT_OTHER): Payer: Medicare Other

## 2017-01-01 ENCOUNTER — Other Ambulatory Visit (HOSPITAL_BASED_OUTPATIENT_CLINIC_OR_DEPARTMENT_OTHER): Payer: Medicare Other

## 2017-01-01 ENCOUNTER — Ambulatory Visit (HOSPITAL_BASED_OUTPATIENT_CLINIC_OR_DEPARTMENT_OTHER): Payer: Medicare Other | Admitting: Nurse Practitioner

## 2017-01-01 DIAGNOSIS — R52 Pain, unspecified: Secondary | ICD-10-CM | POA: Diagnosis not present

## 2017-01-01 DIAGNOSIS — Z5112 Encounter for antineoplastic immunotherapy: Secondary | ICD-10-CM | POA: Diagnosis present

## 2017-01-01 DIAGNOSIS — Z9484 Stem cells transplant status: Secondary | ICD-10-CM

## 2017-01-01 DIAGNOSIS — C9002 Multiple myeloma in relapse: Secondary | ICD-10-CM

## 2017-01-01 DIAGNOSIS — C9000 Multiple myeloma not having achieved remission: Secondary | ICD-10-CM

## 2017-01-01 LAB — CBC WITH DIFFERENTIAL/PLATELET
BASO%: 0.3 % (ref 0.0–2.0)
Basophils Absolute: 0 10*3/uL (ref 0.0–0.1)
EOS%: 0.4 % (ref 0.0–7.0)
Eosinophils Absolute: 0.1 10*3/uL (ref 0.0–0.5)
HCT: 32.4 % — ABNORMAL LOW (ref 34.8–46.6)
HGB: 10.3 g/dL — ABNORMAL LOW (ref 11.6–15.9)
LYMPH%: 3.7 % — ABNORMAL LOW (ref 14.0–49.7)
MCH: 31.7 pg (ref 25.1–34.0)
MCHC: 31.8 g/dL (ref 31.5–36.0)
MCV: 99.8 fL (ref 79.5–101.0)
MONO#: 1.7 10*3/uL — ABNORMAL HIGH (ref 0.1–0.9)
MONO%: 10.3 % (ref 0.0–14.0)
NEUT#: 13.7 10*3/uL — ABNORMAL HIGH (ref 1.5–6.5)
NEUT%: 85.3 % — AB (ref 38.4–76.8)
Platelets: 162 10*3/uL (ref 145–400)
RBC: 3.24 10*6/uL — ABNORMAL LOW (ref 3.70–5.45)
RDW: 17 % — ABNORMAL HIGH (ref 11.2–14.5)
WBC: 16.1 10*3/uL — ABNORMAL HIGH (ref 3.9–10.3)
lymph#: 0.6 10*3/uL — ABNORMAL LOW (ref 0.9–3.3)

## 2017-01-01 LAB — COMPREHENSIVE METABOLIC PANEL
ALT: 54 U/L (ref 0–55)
AST: 68 U/L — AB (ref 5–34)
Albumin: 3.2 g/dL — ABNORMAL LOW (ref 3.5–5.0)
Alkaline Phosphatase: 89 U/L (ref 40–150)
Anion Gap: 10 mEq/L (ref 3–11)
BUN: 16.6 mg/dL (ref 7.0–26.0)
CHLORIDE: 104 meq/L (ref 98–109)
CO2: 27 meq/L (ref 22–29)
CREATININE: 0.9 mg/dL (ref 0.6–1.1)
Calcium: 10.6 mg/dL — ABNORMAL HIGH (ref 8.4–10.4)
EGFR: 77 mL/min/{1.73_m2} — ABNORMAL LOW (ref >90)
Glucose: 114 mg/dl (ref 70–140)
Potassium: 4.4 mEq/L (ref 3.5–5.1)
SODIUM: 141 meq/L (ref 136–145)
Total Bilirubin: 1.15 mg/dL (ref 0.20–1.20)
Total Protein: 6.9 g/dL (ref 6.4–8.3)

## 2017-01-01 MED ORDER — BORTEZOMIB CHEMO SQ INJECTION 3.5 MG (2.5MG/ML)
1.3000 mg/m2 | Freq: Once | INTRAMUSCULAR | Status: AC
  Administered 2017-01-01: 2.5 mg via SUBCUTANEOUS
  Filled 2017-01-01: qty 2.5

## 2017-01-01 MED ORDER — PROCHLORPERAZINE MALEATE 10 MG PO TABS
ORAL_TABLET | ORAL | Status: AC
  Filled 2017-01-01: qty 1

## 2017-01-01 MED ORDER — PROCHLORPERAZINE MALEATE 10 MG PO TABS
10.0000 mg | ORAL_TABLET | Freq: Once | ORAL | Status: AC
  Administered 2017-01-01: 10 mg via ORAL

## 2017-01-01 MED ORDER — OXYCODONE-ACETAMINOPHEN 5-325 MG PO TABS: 1.0000 | ORAL_TABLET | Freq: Three times a day (TID) | ORAL | 0 refills | Status: DC | PRN

## 2017-01-01 NOTE — Patient Instructions (Signed)
[facility] Discharge Instructions for Patients Receiving Chemotherapy  Today you received the following chemotherapy agents Velcade. To help prevent nausea and vomiting after your treatment, we encourage you to take your nausea medication as directed.  If you develop nausea and vomiting that is not controlled by your nausea medication, call the clinic.   BELOW ARE SYMPTOMS THAT SHOULD BE REPORTED IMMEDIATELY:  *FEVER GREATER THAN 100.5 F  *CHILLS WITH OR WITHOUT FEVER  NAUSEA AND VOMITING THAT IS NOT CONTROLLED WITH YOUR NAUSEA MEDICATION  *UNUSUAL SHORTNESS OF BREATH  *UNUSUAL BRUISING OR BLEEDING  TENDERNESS IN MOUTH AND THROAT WITH OR WITHOUT PRESENCE OF ULCERS  *URINARY PROBLEMS  *BOWEL PROBLEMS  UNUSUAL RASH Items with * indicate a potential emergency and should be followed up as soon as possible.  Feel free to call the clinic you have any questions or concerns. The clinic phone number is [phone].  Please show the CHEMO ALERT CARD at check-in to the Emergency Department and triage nurse.    

## 2017-01-01 NOTE — Progress Notes (Signed)
  [facility] OFFICE PROGRESS NOTE   DIAGNOSIS: Stage II a IgA kappa multiple myeloma diagnosed in 2006.  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  2) the patient had evidence for disease recurrence in August of 2008 and she was treated with 6 cycles of Velcade completed on 03/30/2008 with response to the treatment but was discontinued secondary to neuropathy.  3) maintenance treatment with Revlimid 10 mg by mouth daily by the does was later increase to 15 mg by mouth daily secondary to biochemical recurrence with stabilization of her disease. The treatment with Revlimid 15 mg by mouth daily was discontinued secondary to evidence for disease progression. 4) Revlimid 25 mg by mouth daily for 3 weeks every 4 weeks in addition to Decadron currently at 20 mg by mouth on a weekly basis. She is status post 12 cycles. This was discontinued secondary to disease progression. 5) systemic chemotherapy with Daratumumab 16 MG/KG weekly for the first 9 weeks, Velcade 1.3 MG/M2 subcutaneously on days 1, 4, 8 and 11 every 3 weeks in addition to Decadron 20 mg by mouth on days 1, 2, 4, 5, 8, 9, 11 and 12 every 3 weeks. First dose 05/07/2016. Status post one cycle discontinued secondary to intolerance.  CURRENT THERAPY: 1) systemic chemotherapy with Velcade 1.3 MG/M2 subcutaneously weekly in addition to Decadron 40 mg by mouth daily weekly status post 27 cycles. 2) Zometa 4 mg IV every 3 months. First dose 05/05/2015.    INTERVAL HISTORY:   Ms. Giaimo returns as scheduled. She continues weekly Velcade/dexamethasone. She completed cycle 27 12/25/2016. She denies nausea/vomiting. No mouth sores. No diarrhea or constipation. No rash. No numbness or tingling in her hands or feet. Stable intermittent pain involving the right hip. She takes Percocet as needed.  No shortness of breath.  Objective:  Vital signs in last 24 hours:  Blood pressure 124/72, pulse 88, temperature 98 F (36.7 C), temperature source Oral, resp. rate 18, height 5' 6" (1.676 m), weight 186 lb (84.4 kg), SpO2 100 %.    HEENT: No thrush or ulcers. Resp: Lungs clear bilaterally. Cardio: Regular rate and rhythm. GI: Abdomen soft and nontender. No organomegaly. Vascular: No leg edema. Calves soft and nontender.    Lab Results:  Lab Results  Component Value Date   WBC 16.1 (H) 01/01/2017   HGB 10.3 (L) 01/01/2017   HCT 32.4 (L) 01/01/2017   MCV 99.8 01/01/2017   PLT 162 01/01/2017   NEUTROABS 13.7 (H) 01/01/2017    Imaging:  No results found.  Medications: I have reviewed the patient's current medications.  Assessment/Plan: 1. Recurrent multiple myeloma currently on active treatment with weekly Velcade/dexamethasone. 2. Pain management. She takes Percocet as needed.   Disposition: Darlene Howard appears stable. She has completed 27 cycles of weekly Velcade/dexamethasone. Plan to proceed with cycle 28 today as scheduled. She will return for a follow-up visit in 3 weeks. She will contact the office in the interim with any problems.   She was provided with a new Percocet prescription at today's visit.  Plan reviewed with Dr. [name].    Ned Card ANP/GNP-BC   01/01/2017  11:38 AM

## 2017-01-02 ENCOUNTER — Telehealth: Payer: Self-pay | Admitting: Internal Medicine

## 2017-01-02 NOTE — Telephone Encounter (Signed)
Per 1/2 los zometa q45molast given 10/15/16. Extended 1/15 infusion appointment to account for zometa. No change in appointment times for 1/15. No other orders per 1/2 los.

## 2017-01-07 ENCOUNTER — Other Ambulatory Visit (HOSPITAL_BASED_OUTPATIENT_CLINIC_OR_DEPARTMENT_OTHER): Payer: Medicare Other

## 2017-01-07 ENCOUNTER — Ambulatory Visit (HOSPITAL_BASED_OUTPATIENT_CLINIC_OR_DEPARTMENT_OTHER): Payer: Medicare Other

## 2017-01-07 VITALS — BP 128/81 | HR 115 | Temp 98.4°F | Resp 20

## 2017-01-07 DIAGNOSIS — C9002 Multiple myeloma in relapse: Secondary | ICD-10-CM | POA: Diagnosis present

## 2017-01-07 DIAGNOSIS — Z5112 Encounter for antineoplastic immunotherapy: Secondary | ICD-10-CM | POA: Diagnosis present

## 2017-01-07 LAB — CBC WITH DIFFERENTIAL/PLATELET
BASO%: 0.1 % (ref 0.0–2.0)
Basophils Absolute: 0 10*3/uL (ref 0.0–0.1)
EOS ABS: 0.1 10*3/uL (ref 0.0–0.5)
EOS%: 0.3 % (ref 0.0–7.0)
HCT: 33.6 % — ABNORMAL LOW (ref 34.8–46.6)
HGB: 10.6 g/dL — ABNORMAL LOW (ref 11.6–15.9)
LYMPH%: 4.8 % — AB (ref 14.0–49.7)
MCH: 31.5 pg (ref 25.1–34.0)
MCHC: 31.5 g/dL (ref 31.5–36.0)
MCV: 99.7 fL (ref 79.5–101.0)
MONO#: 1.8 10*3/uL — AB (ref 0.1–0.9)
MONO%: 11.2 % (ref 0.0–14.0)
NEUT#: 13.8 10*3/uL — ABNORMAL HIGH (ref 1.5–6.5)
NEUT%: 83.6 % — AB (ref 38.4–76.8)
PLATELETS: 140 10*3/uL — AB (ref 145–400)
RBC: 3.37 10*6/uL — AB (ref 3.70–5.45)
RDW: 15.2 % — ABNORMAL HIGH (ref 11.2–14.5)
WBC: 16.5 10*3/uL — ABNORMAL HIGH (ref 3.9–10.3)
lymph#: 0.8 10*3/uL — ABNORMAL LOW (ref 0.9–3.3)

## 2017-01-07 LAB — COMPREHENSIVE METABOLIC PANEL
ALT: 18 U/L (ref 0–55)
ANION GAP: 10 meq/L (ref 3–11)
AST: 16 U/L (ref 5–34)
Albumin: 3.1 g/dL — ABNORMAL LOW (ref 3.5–5.0)
Alkaline Phosphatase: 84 U/L (ref 40–150)
BILIRUBIN TOTAL: 1.13 mg/dL (ref 0.20–1.20)
BUN: 12.3 mg/dL (ref 7.0–26.0)
CHLORIDE: 102 meq/L (ref 98–109)
CO2: 29 meq/L (ref 22–29)
Calcium: 10.3 mg/dL (ref 8.4–10.4)
Creatinine: 0.8 mg/dL (ref 0.6–1.1)
EGFR: 84 mL/min/{1.73_m2} — ABNORMAL LOW (ref >90)
Glucose: 119 mg/dl (ref 70–140)
Potassium: 3.9 mEq/L (ref 3.5–5.1)
SODIUM: 141 meq/L (ref 136–145)
TOTAL PROTEIN: 6.8 g/dL (ref 6.4–8.3)

## 2017-01-07 MED ORDER — BORTEZOMIB CHEMO SQ INJECTION 3.5 MG (2.5MG/ML)
1.3000 mg/m2 | Freq: Once | INTRAMUSCULAR | Status: AC
  Administered 2017-01-07: 2.5 mg via SUBCUTANEOUS
  Filled 2017-01-07: qty 2.5

## 2017-01-07 MED ORDER — PROCHLORPERAZINE MALEATE 10 MG PO TABS
10.0000 mg | ORAL_TABLET | Freq: Once | ORAL | Status: AC
  Administered 2017-01-07: 10 mg via ORAL

## 2017-01-07 MED ORDER — PROCHLORPERAZINE MALEATE 10 MG PO TABS
ORAL_TABLET | ORAL | Status: AC
  Filled 2017-01-07: qty 1

## 2017-01-07 MED ORDER — SODIUM CHLORIDE 0.9 % IV SOLN
Freq: Once | INTRAVENOUS | Status: AC
  Administered 2017-01-07: 13:00:00 via INTRAVENOUS

## 2017-01-07 NOTE — Patient Instructions (Signed)
[facility] Discharge Instructions for Patients Receiving Chemotherapy  Today you received the following chemotherapy agents:  Velcade.  To help prevent nausea and vomiting after your treatment, we encourage you to take your nausea medication as directed.   If you develop nausea and vomiting that is not controlled by your nausea medication, call the clinic.   BELOW ARE SYMPTOMS THAT SHOULD BE REPORTED IMMEDIATELY:  *FEVER GREATER THAN 100.5 F  *CHILLS WITH OR WITHOUT FEVER  NAUSEA AND VOMITING THAT IS NOT CONTROLLED WITH YOUR NAUSEA MEDICATION  *UNUSUAL SHORTNESS OF BREATH  *UNUSUAL BRUISING OR BLEEDING  TENDERNESS IN MOUTH AND THROAT WITH OR WITHOUT PRESENCE OF ULCERS  *URINARY PROBLEMS  *BOWEL PROBLEMS  UNUSUAL RASH Items with * indicate a potential emergency and should be followed up as soon as possible.  Feel free to call the clinic you have any questions or concerns. The clinic phone number is [phone].  Please show the [place] at check-in to the Emergency Department and triage nurse.    Dehydration, Adult Dehydration is a condition in which there is not enough fluid or water in the body. This happens when you lose more fluids than you take in. Important organs, such as the kidneys, brain, and heart, cannot function without a proper amount of fluids. Any loss of fluids from the body can lead to dehydration. Dehydration can range from mild to severe. This condition should be treated right away to prevent it from becoming severe. What are the causes? This condition may be caused by:  Vomiting.  Diarrhea.  Excessive sweating, such as from heat exposure or exercise.  Not drinking enough fluid, especially:  When ill.  While doing activity that requires a lot of energy.  Excessive urination.  Fever.  Infection.  Certain medicines, such as medicines that cause the body to lose excess fluid (diuretics).  Inability to access safe  drinking water.  Reduced physical ability to get adequate water and food. What increases the risk? This condition is more likely to develop in people:  Who have a poorly controlled long-term (chronic) illness, such as diabetes, heart disease, or kidney disease.  Who are age 69 or older.  Who are disabled.  Who live in a place with high altitude.  Who play endurance sports. What are the signs or symptoms? Symptoms of mild dehydration may include:  Thirst.  Dry lips.  Slightly dry mouth.  Dry, warm skin.  Dizziness. Symptoms of moderate dehydration may include:  Very dry mouth.  Muscle cramps.  Dark urine. Urine may be the color of tea.  Decreased urine production.  Decreased tear production.  Heartbeat that is irregular or faster than normal (palpitations).  Headache.  Light-headedness, especially when you stand up from a sitting position.  Fainting (syncope). Symptoms of severe dehydration may include:  Changes in skin, such as:  Cold and clammy skin.  Blotchy (mottled) or pale skin.  Skin that does not quickly return to normal after being lightly pinched and released (poor skin turgor).  Changes in body fluids, such as:  Extreme thirst.  No tear production.  Inability to sweat when body temperature is high, such as in hot weather.  Very little urine production.  Changes in vital signs, such as:  Weak pulse.  Pulse that is more than 100 beats a minute when sitting still.  Rapid breathing.  Low blood pressure.  Other changes, such as:  Sunken eyes.  Cold hands and feet.  Confusion.  Lack of  energy (lethargy).  Difficulty waking up from sleep.  Short-term weight loss.  Unconsciousness. How is this diagnosed? This condition is diagnosed based on your symptoms and a physical exam. Blood and urine tests may be done to help confirm the diagnosis. How is this treated? Treatment for this condition depends on the severity. Mild or  moderate dehydration can often be treated at home. Treatment should be started right away. Do not wait until dehydration becomes severe. Severe dehydration is an emergency and it needs to be treated in a hospital. Treatment for mild dehydration may include:  Drinking more fluids.  Replacing salts and minerals in your blood (electrolytes) that you may have lost. Treatment for moderate dehydration may include:  Drinking an oral rehydration solution (ORS). This is a drink that helps you replace fluids and electrolytes (rehydrate). It can be found at pharmacies and retail stores. Treatment for severe dehydration may include:  Receiving fluids through an IV tube.  Receiving an electrolyte solution through a feeding tube that is passed through your nose and into your stomach (nasogastric tube, or NG tube).  Correcting any abnormalities in electrolytes.  Treating the underlying cause of dehydration. Follow these instructions at home:  If directed by your health care provider, drink an ORS:  Make an ORS by following instructions on the package.  Start by drinking small amounts, about  cup (120 mL) every 5-10 minutes.  Slowly increase how much you drink until you have taken the amount recommended by your health care provider.  Drink enough clear fluid to keep your urine clear or pale yellow. If you were told to drink an ORS, finish the ORS first, then start slowly drinking other clear fluids. Drink fluids such as:  Water. Do not drink only water. Doing that can lead to having too little salt (sodium) in the body (hyponatremia).  Ice chips.  Fruit juice that you have added water to (diluted fruit juice).  Low-calorie sports drinks.  Avoid:  Alcohol.  Drinks that contain a lot of sugar. These include high-calorie sports drinks, fruit juice that is not diluted, and soda.  Caffeine.  Foods that are greasy or contain a lot of fat or sugar.  Take over-the-counter and prescription  medicines only as told by your health care provider.  Do not take sodium tablets. This can lead to having too much sodium in the body (hypernatremia).  Eat foods that contain a healthy balance of electrolytes, such as bananas, oranges, potatoes, tomatoes, and spinach.  Keep all follow-up visits as told by your health care provider. This is important. Contact a health care provider if:  You have abdominal pain that:  Gets worse.  Stays in one area (localizes).  You have a rash.  You have a stiff neck.  You are more irritable than usual.  You are sleepier or more difficult to wake up than usual.  You feel weak or dizzy.  You feel very thirsty.  You have urinated only a small amount of very dark urine over 6-8 hours. Get help right away if:  You have symptoms of severe dehydration.  You cannot drink fluids without vomiting.  Your symptoms get worse with treatment.  You have a fever.  You have a severe headache.  You have vomiting or diarrhea that:  Gets worse.  Does not go away.  You have blood or green matter (bile) in your vomit.  You have blood in your stool. This may cause stool to look black and tarry.  You have  not urinated in 6-8 hours.  You faint.  Your heart rate while sitting still is over 100 beats a minute.  You have trouble breathing. This information is not intended to replace advice given to you by your health care provider. Make sure you discuss any questions you have with your health care provider. Document Released: 12/17/2005 Document Revised: 07/13/2016 Document Reviewed: 02/10/2016 Elsevier Interactive Patient Education  2017 [employer].

## 2017-01-07 NOTE — Progress Notes (Signed)
01/07/2017 Per Dr. Earlie Server ok to treat with heart rate of 120, give additional 500 ml NS while here today.

## 2017-01-08 ENCOUNTER — Telehealth: Payer: Self-pay | Admitting: *Deleted

## 2017-01-08 NOTE — Telephone Encounter (Signed)
"  I was there yesterday for treatment and received IVF for dehydration.  What should I do at home to avoid dehydration."  Instructed to drink eight, eight ounce glasses of water.  Drink things like sports drinks, broth, jello, [place] ices or popsicles, herbal teas.  Coffee and black teas have a diuretic effect and should be limited to one cup per day.  Made a list of these choices.  No further questions.

## 2017-01-14 ENCOUNTER — Ambulatory Visit (HOSPITAL_BASED_OUTPATIENT_CLINIC_OR_DEPARTMENT_OTHER): Payer: Medicare Other

## 2017-01-14 ENCOUNTER — Other Ambulatory Visit (HOSPITAL_BASED_OUTPATIENT_CLINIC_OR_DEPARTMENT_OTHER): Payer: Medicare Other

## 2017-01-14 VITALS — BP 133/64 | HR 109 | Temp 98.2°F | Resp 18

## 2017-01-14 DIAGNOSIS — Z5112 Encounter for antineoplastic immunotherapy: Secondary | ICD-10-CM | POA: Diagnosis present

## 2017-01-14 DIAGNOSIS — C9002 Multiple myeloma in relapse: Secondary | ICD-10-CM

## 2017-01-14 DIAGNOSIS — C9000 Multiple myeloma not having achieved remission: Secondary | ICD-10-CM

## 2017-01-14 LAB — COMPREHENSIVE METABOLIC PANEL
ALBUMIN: 3 g/dL — AB (ref 3.5–5.0)
ALK PHOS: 101 U/L (ref 40–150)
ALT: 56 U/L — ABNORMAL HIGH (ref 0–55)
AST: 64 U/L — AB (ref 5–34)
Anion Gap: 11 mEq/L (ref 3–11)
BILIRUBIN TOTAL: 1.08 mg/dL (ref 0.20–1.20)
BUN: 15.8 mg/dL (ref 7.0–26.0)
CO2: 27 mEq/L (ref 22–29)
Calcium: 10.6 mg/dL — ABNORMAL HIGH (ref 8.4–10.4)
Chloride: 103 mEq/L (ref 98–109)
Creatinine: 0.9 mg/dL (ref 0.6–1.1)
EGFR: 76 mL/min/{1.73_m2} — AB (ref >90)
GLUCOSE: 116 mg/dL (ref 70–140)
Potassium: 3.9 mEq/L (ref 3.5–5.1)
SODIUM: 140 meq/L (ref 136–145)
TOTAL PROTEIN: 6.9 g/dL (ref 6.4–8.3)

## 2017-01-14 LAB — CBC WITH DIFFERENTIAL/PLATELET
BASO%: 0.1 % (ref 0.0–2.0)
Basophils Absolute: 0 10*3/uL (ref 0.0–0.1)
EOS ABS: 0 10*3/uL (ref 0.0–0.5)
EOS%: 0.3 % (ref 0.0–7.0)
HCT: 31.9 % — ABNORMAL LOW (ref 34.8–46.6)
HEMOGLOBIN: 10.3 g/dL — AB (ref 11.6–15.9)
LYMPH#: 0.5 10*3/uL — AB (ref 0.9–3.3)
LYMPH%: 2.8 % — ABNORMAL LOW (ref 14.0–49.7)
MCH: 32 pg (ref 25.1–34.0)
MCHC: 32.2 g/dL (ref 31.5–36.0)
MCV: 99.5 fL (ref 79.5–101.0)
MONO#: 0.8 10*3/uL (ref 0.1–0.9)
MONO%: 4.6 % (ref 0.0–14.0)
NEUT#: 16.2 10*3/uL — ABNORMAL HIGH (ref 1.5–6.5)
NEUT%: 92.2 % — ABNORMAL HIGH (ref 38.4–76.8)
Platelets: 145 10*3/uL (ref 145–400)
RBC: 3.21 10*6/uL — ABNORMAL LOW (ref 3.70–5.45)
RDW: 16.9 % — ABNORMAL HIGH (ref 11.2–14.5)
WBC: 17.6 10*3/uL — AB (ref 3.9–10.3)

## 2017-01-14 MED ORDER — ZOLEDRONIC ACID 4 MG/100ML IV SOLN
4.0000 mg | Freq: Once | INTRAVENOUS | Status: AC
  Administered 2017-01-14: 4 mg via INTRAVENOUS
  Filled 2017-01-14: qty 100

## 2017-01-14 MED ORDER — PROCHLORPERAZINE MALEATE 10 MG PO TABS
10.0000 mg | ORAL_TABLET | Freq: Once | ORAL | Status: AC
  Administered 2017-01-14: 10 mg via ORAL

## 2017-01-14 MED ORDER — BORTEZOMIB CHEMO SQ INJECTION 3.5 MG (2.5MG/ML)
1.3000 mg/m2 | Freq: Once | INTRAMUSCULAR | Status: AC
  Administered 2017-01-14: 2.5 mg via SUBCUTANEOUS
  Filled 2017-01-14: qty 2.5

## 2017-01-14 MED ORDER — SODIUM CHLORIDE 0.9 % IV SOLN
Freq: Once | INTRAVENOUS | Status: AC
  Administered 2017-01-14: 13:00:00 via INTRAVENOUS

## 2017-01-14 MED ORDER — PROCHLORPERAZINE MALEATE 10 MG PO TABS
ORAL_TABLET | ORAL | Status: AC
  Filled 2017-01-14: qty 1

## 2017-01-14 NOTE — Patient Instructions (Signed)
[facility] Discharge Instructions for Patients Receiving Chemotherapy  Today you received the following chemotherapy agents:  Velcade  To help prevent nausea and vomiting after your treatment, we encourage you to take your nausea medication as prescribed.   If you develop nausea and vomiting that is not controlled by your nausea medication, call the clinic.   BELOW ARE SYMPTOMS THAT SHOULD BE REPORTED IMMEDIATELY:  *FEVER GREATER THAN 100.5 F  *CHILLS WITH OR WITHOUT FEVER  NAUSEA AND VOMITING THAT IS NOT CONTROLLED WITH YOUR NAUSEA MEDICATION  *UNUSUAL SHORTNESS OF BREATH  *UNUSUAL BRUISING OR BLEEDING  TENDERNESS IN MOUTH AND THROAT WITH OR WITHOUT PRESENCE OF ULCERS  *URINARY PROBLEMS  *BOWEL PROBLEMS  UNUSUAL RASH Items with * indicate a potential emergency and should be followed up as soon as possible.  Feel free to call the clinic you have any questions or concerns. The clinic phone number is [phone].  Please show the CHEMO ALERT CARD at check-in to the Emergency Department and triage nurse.   

## 2017-01-21 ENCOUNTER — Ambulatory Visit (HOSPITAL_COMMUNITY)
Admission: RE | Admit: 2017-01-21 | Discharge: 2017-01-21 | Disposition: A | Discharge status: Home / Self Care | Payer: Medicare Other | Source: Ambulatory Visit | Attending: Internal Medicine | Admitting: Internal Medicine

## 2017-01-21 ENCOUNTER — Telehealth: Payer: Self-pay | Admitting: Internal Medicine

## 2017-01-21 ENCOUNTER — Encounter: Payer: Self-pay | Admitting: Internal Medicine

## 2017-01-21 ENCOUNTER — Other Ambulatory Visit (HOSPITAL_BASED_OUTPATIENT_CLINIC_OR_DEPARTMENT_OTHER): Payer: Medicare Other

## 2017-01-21 ENCOUNTER — Ambulatory Visit (HOSPITAL_BASED_OUTPATIENT_CLINIC_OR_DEPARTMENT_OTHER): Payer: Medicare Other

## 2017-01-21 ENCOUNTER — Ambulatory Visit (HOSPITAL_BASED_OUTPATIENT_CLINIC_OR_DEPARTMENT_OTHER): Payer: Medicare Other | Admitting: Internal Medicine

## 2017-01-21 ENCOUNTER — Other Ambulatory Visit: Payer: Self-pay | Admitting: Internal Medicine

## 2017-01-21 VITALS — HR 102

## 2017-01-21 DIAGNOSIS — Z5112 Encounter for antineoplastic immunotherapy: Secondary | ICD-10-CM | POA: Diagnosis present

## 2017-01-21 DIAGNOSIS — S52392A Other fracture of shaft of radius, left arm, initial encounter for closed fracture: Secondary | ICD-10-CM | POA: Insufficient documentation

## 2017-01-21 DIAGNOSIS — M79632 Pain in left forearm: Secondary | ICD-10-CM | POA: Diagnosis not present

## 2017-01-21 DIAGNOSIS — X58XXXA Exposure to other specified factors, initial encounter: Secondary | ICD-10-CM | POA: Insufficient documentation

## 2017-01-21 DIAGNOSIS — C9002 Multiple myeloma in relapse: Secondary | ICD-10-CM | POA: Diagnosis not present

## 2017-01-21 DIAGNOSIS — S52322A Displaced transverse fracture of shaft of left radius, initial encounter for closed fracture: Secondary | ICD-10-CM

## 2017-01-21 DIAGNOSIS — S5292XA Unspecified fracture of left forearm, initial encounter for closed fracture: Secondary | ICD-10-CM

## 2017-01-21 DIAGNOSIS — M899 Disorder of bone, unspecified: Secondary | ICD-10-CM | POA: Insufficient documentation

## 2017-01-21 DIAGNOSIS — R5383 Other fatigue: Secondary | ICD-10-CM | POA: Diagnosis not present

## 2017-01-21 DIAGNOSIS — S52122A Displaced fracture of head of left radius, initial encounter for closed fracture: Secondary | ICD-10-CM | POA: Diagnosis not present

## 2017-01-21 DIAGNOSIS — C9000 Multiple myeloma not having achieved remission: Secondary | ICD-10-CM

## 2017-01-21 HISTORY — DX: Unspecified fracture of left forearm, initial encounter for closed fracture: S52.92XA

## 2017-01-21 LAB — CBC WITH DIFFERENTIAL/PLATELET
BASO%: 0 % (ref 0.0–2.0)
Basophils Absolute: 0 10*3/uL (ref 0.0–0.1)
EOS ABS: 0 10*3/uL (ref 0.0–0.5)
EOS%: 0.1 % (ref 0.0–7.0)
HCT: 32.9 % — ABNORMAL LOW (ref 34.8–46.6)
HEMOGLOBIN: 10.6 g/dL — AB (ref 11.6–15.9)
LYMPH#: 0.5 10*3/uL — AB (ref 0.9–3.3)
LYMPH%: 2.1 % — ABNORMAL LOW (ref 14.0–49.7)
MCH: 31.8 pg (ref 25.1–34.0)
MCHC: 32.2 g/dL (ref 31.5–36.0)
MCV: 98.8 fL (ref 79.5–101.0)
MONO#: 0.8 10*3/uL (ref 0.1–0.9)
MONO%: 3.7 % (ref 0.0–14.0)
NEUT%: 94.1 % — ABNORMAL HIGH (ref 38.4–76.8)
NEUTROS ABS: 20.6 10*3/uL — AB (ref 1.5–6.5)
PLATELETS: 164 10*3/uL (ref 145–400)
RBC: 3.33 10*6/uL — ABNORMAL LOW (ref 3.70–5.45)
RDW: 14.8 % — AB (ref 11.2–14.5)
WBC: 21.8 10*3/uL — AB (ref 3.9–10.3)

## 2017-01-21 LAB — COMPREHENSIVE METABOLIC PANEL
ALBUMIN: 3 g/dL — AB (ref 3.5–5.0)
ALK PHOS: 89 U/L (ref 40–150)
ALT: 17 U/L (ref 0–55)
ANION GAP: 11 meq/L (ref 3–11)
AST: 22 U/L (ref 5–34)
BILIRUBIN TOTAL: 0.93 mg/dL (ref 0.20–1.20)
BUN: 12 mg/dL (ref 7.0–26.0)
CALCIUM: 9.6 mg/dL (ref 8.4–10.4)
CO2: 24 mEq/L (ref 22–29)
Chloride: 103 mEq/L (ref 98–109)
Creatinine: 1.1 mg/dL (ref 0.6–1.1)
EGFR: 63 mL/min/{1.73_m2} — AB (ref >90)
GLUCOSE: 139 mg/dL (ref 70–140)
POTASSIUM: 3.7 meq/L (ref 3.5–5.1)
SODIUM: 138 meq/L (ref 136–145)
TOTAL PROTEIN: 7 g/dL (ref 6.4–8.3)

## 2017-01-21 MED ORDER — OXYCODONE-ACETAMINOPHEN 5-325 MG PO TABS: 1.0000 | ORAL_TABLET | Freq: Three times a day (TID) | ORAL | 0 refills | Status: DC | PRN

## 2017-01-21 MED ORDER — PROCHLORPERAZINE MALEATE 10 MG PO TABS
ORAL_TABLET | ORAL | Status: AC
  Filled 2017-01-21: qty 1

## 2017-01-21 MED ORDER — PROCHLORPERAZINE MALEATE 10 MG PO TABS
10.0000 mg | ORAL_TABLET | Freq: Once | ORAL | Status: AC
  Administered 2017-01-21: 10 mg via ORAL

## 2017-01-21 MED ORDER — BORTEZOMIB CHEMO SQ INJECTION 3.5 MG (2.5MG/ML)
1.3000 mg/m2 | Freq: Once | INTRAMUSCULAR | Status: AC
  Administered 2017-01-21: 2.5 mg via SUBCUTANEOUS
  Filled 2017-01-21: qty 2.5

## 2017-01-21 NOTE — Progress Notes (Signed)
Okay to treat with heart rate of 103 per Shauna Hugh, RN, per Dr. [name].

## 2017-01-21 NOTE — Telephone Encounter (Signed)
Patient came to scheduling to retrieve her AVS report and calendars with future scheduled appointments.

## 2017-01-21 NOTE — Progress Notes (Signed)
re

## 2017-01-21 NOTE — Progress Notes (Signed)
[place] [phone]   [phone]  OFFICE PROGRESS NOTE  Darlene Nakayama, MD [address], [address] [place] [address]  DIAGNOSIS: Stage II a high IgA kappa multiple myeloma diagnosed in 2006.  PRIOR THERAPY: 1) status post treatment with thalidomide and Decadron followed by autologous peripheral blood stem cell transplant with high-dose melphalan on 04/26/2006 at [facility].  2) the patient had evidence for disease recurrence in August of 2008 and she was treated with 6 cycles of Velcade completed on 03/30/2008 with response to the treatment but was discontinued secondary to neuropathy.  3) maintenance treatment with Revlimid 10 mg by mouth daily by the does was later increase to 15 mg by mouth daily secondary to biochemical recurrence with stabilization of her disease. The treatment with Revlimid 15 mg by mouth daily was discontinued secondary to evidence for disease progression. 4) Revlimid 25 mg by mouth daily for 3 weeks every 4 weeks in addition to Decadron currently at 20 mg by mouth on a weekly basis. She is status post 12 cycles. This was discontinued secondary to disease progression. 5) systemic chemotherapy with Daratumumab 16 MG/KG weekly for the first 9 weeks, Velcade 1.3 MG/M2 subcutaneously on days 1, 4, 8 and 11 every 3 weeks in addition to Decadron 20 mg by mouth on days 1, 2, 4, 5, 8, 9, 11 and 12 every 3 weeks. First dose 05/07/2016. Status post one cycle discontinued secondary to intolerance.  CURRENT THERAPY: 1) systemic chemotherapy with Velcade 1.3 MG/M2 subcutaneously weekly in addition to Decadron 40 mg by mouth daily weekly status post 30 cycles. 2) Zometa 4 mg IV every 3 months. First dose 05/05/2015.  INTERVAL HISTORY: Darlene Howard 66 y.o. female came to the clinic today for follow-up visit accompanied by 2 family members. The patient is feeling fine except for fatigue and pain in the left forearm. She  mentioned that she was pulling her car door week ago and she felt pain in her arm since that time. She did not seek any medical attention. She has no current nausea, vomiting, diarrhea or constipation. She is tolerating her treatment with Velcade and Decadron well. She has no chest pain, shortness breath, cough or hemoptysis. She has no fever or chills. She is here today for evaluation before starting cycle #31 of treatment with Velcade.  MEDICAL HISTORY: Past Medical History:  Diagnosis Date  . Bruises easily    d/t being on COumadin  . Cholelithiasis   . DDD (degenerative disc disease), lumbar   . Fatty liver   . GERD (gastroesophageal reflux disease)    takes Pantoprazole daily  . Glaucoma    uses eye drops as instructed  . H/O stem cell transplant ([place])    04-26-2006  AT [facility]  . Herpes    takes Valtrex as needed  . History of colon polyps   . Hypertension    takes Amlodipine and Diovan daily  . Lung cancer ([place])    chest wall lesion aadn 6th rib  . Multiple myeloma DX  2006  STATE IIA,  IgA  KAPPA   STABLE  PER DR Nethra Mehlberg--  S/P STEM CELL TRANSPLANT 2007 /  RECURRENCE 2008  CHEMO ENDED 03-30-2008/   NOW ON MAINTANANCE REVLIMID )  . 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 Personal history of colon cancer    ASCENDING COLON--  S/P  RIGHT HEMICOLECTOMY WITH  NEGATIVE NODES/    NO RECURRENCE  . Type 2 diabetes mellitus ([facility])   . Vocal cord nodule    resolved- see Dr. Deeann Saint notes in media tab 06/17/14    ALLERGIES:  is allergic to codeine and tramadol.  MEDICATIONS:  Current Outpatient Prescriptions  Medication Sig Dispense Refill  . acetaminophen (TYLENOL) 500 MG tablet Take 500 mg by mouth daily as needed for moderate pain. Reported on 06/25/2016    . aspirin (ASPIRIN LOW DOSE) 81 MG EC tablet Take 81 mg by mouth daily.      . calcium-vitamin D (OSCAL 500/200 D-3) 500-200 MG-UNIT per tablet Take 1 tablet by mouth 3  (three) times daily.      . cholecalciferol (VITAMIN D) 1000 UNITS tablet Take 1,000 Units by mouth daily. Reported on 04/24/2016    . dexamethasone (DECADRON) 4 MG tablet Take 10 tabs (40 mg) once weekly while undergoing Velcade injections. 40 tablet 2  . dorzolamide-timolol (COSOPT) 22.3-6.8 MG/ML ophthalmic solution Place 1 drop into the left eye 2 (two) times daily.     Marland Kitchen gabapentin (NEURONTIN) 100 MG capsule TAKE 1 CAPSULE BY MOUTH 3  TIMES DAILY 270 capsule 1  . loperamide (IMODIUM A-D) 2 MG tablet Take 2 mg by mouth daily as needed for diarrhea or loose stools. Reported on 07/09/2016    . loratadine (CLARITIN) 10 MG tablet Take 1 tablet (10 mg total) by mouth daily. 30 tablet 5  . LUMIGAN 0.01 % SOLN Place 1 drop into both eyes at bedtime.     . Multiple Vitamins-Minerals (CENTRUM SILVER PO) Take 1 tablet by mouth daily.     . ondansetron (ZOFRAN) 4 MG tablet Take 1 tablet (4 mg total) by mouth every 6 (six) hours. 20 tablet 0  . oxyCODONE-acetaminophen (PERCOCET/ROXICET) 5-325 MG tablet Take 1 tablet by mouth every 8 (eight) hours as needed for severe pain. 40 tablet 0  . pantoprazole (PROTONIX) 40 MG tablet TAKE 1 TABLET BY MOUTH  DAILY FOR ACID REFLUX 90 tablet 1  . potassium chloride SA (K-DUR,KLOR-CON) 20 MEQ tablet TAKE 1 TABLET BY MOUTH  TWICE A DAY 180 tablet 1  . PREMARIN vaginal cream PLACE 1 APPLICATORFUL VAGINALLY TWICE A WEEK. 30 g 0  . temazepam (RESTORIL) 15 MG capsule TAKE 1 CAPSULE AT BEDTIME AS NEEDED FOR SLEEP. 30 capsule 2  . valACYclovir (VALTREX) 1000 MG tablet TAKE 1 TABLET BY MOUTH  DAILY AS NEEDED 90 tablet 0  . valsartan-hydrochlorothiazide (DIOVAN-HCT) 320-25 MG tablet Take 1 tablet by mouth  daily 90 tablet 1  . benzonatate (TESSALON) 100 MG capsule Take 1 capsule (100 mg total) by mouth 3 (three) times daily as needed for cough. (Patient not taking: Reported on 01/21/2017) 30 capsule 0  . promethazine-dextromethorphan (PROMETHAZINE-DM) 6.25-15 MG/5ML syrup One  teaspoon at bedtime as needed, for excess cough (Patient not taking: Reported on 01/21/2017) 240 mL 0   No current facility-administered medications for this visit.     SURGICAL HISTORY:  Past Surgical History:  Procedure Laterality Date  . ABDOMINAL HYSTERECTOMY  1983   w/  appendectomy and left salpingoophorectom  . CATARACT EXTRACTION W/PHACO Left 01/20/2014   Procedure: CATARACT EXTRACTION PHACO AND INTRAOCULAR LENS PLACEMENT (IOC);  Surgeon: Adonis Brook, MD;  Location: [facility];  Service: Ophthalmology;  Laterality: Left;  . CERVICAL CONE BIOPSY    . CHOLECYSTECTOMY N/A 02/28/2016   Procedure: LAPAROSCOPIC CHOLECYSTECTOMY;  Surgeon: Ralene Ok, MD;  Location: [facility];  Service: General;  Laterality: N/A;  .  CO2  ABLATION VULVA PERIANAL AND VAGINAL DYSPLATIC AREAS  02-15-2010  . COLONOSCOPY  10/07/2006   PPJ:[id number] rectum/Status post right hemicolectomy. Normal residual colon  . COLONOSCOPY  10/2009   RMR: diminutive RS polyp (hyperplastic).   . COLONOSCOPY N/A 12/15/2014   Procedure: COLONOSCOPY;  Surgeon: Daneil Dolin, MD;  [facility];  Service: Endoscopy;  Laterality: N/A;  130   . ESOPHAGOGASTRODUODENOSCOPY  04/02/2005   TIW:[id number] polyp with central erosion versus ulceration in the body of the stomach, resected and recovered Remainder of the gastric mucosa, D1, D2 appeared normal  . HEMICOLECTOMY Right 04-27-2005   CARCINOMA   ASCENDING COLON  . LESION REMOVAL N/A 05/06/2014   Procedure: LASER OF THE VAGINA;  Surgeon: Janie Morning, MD;  Location: [facility];  Service: Gynecology;  Laterality: N/A;  . LIMBAL STEM CELL TRANSPLANT  04-26-2006    ([facility])  . VULVAR LESION REMOVAL N/A 05/06/2014   Procedure: LASER OF VULVAR LESION;  Surgeon: Janie Morning, MD;  Location: [facility];  Service: Gynecology;  Laterality: N/A;  . VULVECTOMY N/A 05/06/2014   Procedure: WIDE LOCAL EXCISION LEFT LABIA MAJORA;  Surgeon: Janie Morning,  MD;  Location: [facility];  Service: Gynecology;  Laterality: N/A;    REVIEW OF SYSTEMS:  A comprehensive review of systems was negative except for: Constitutional: positive for fatigue Musculoskeletal: positive for arthralgias and bone pain   PHYSICAL EXAMINATION: General appearance: alert, cooperative and no distress Head: Normocephalic, without obvious abnormality, atraumatic Neck: no adenopathy, no JVD, supple, symmetrical, trachea midline and thyroid not enlarged, symmetric, no tenderness/mass/nodules Lymph nodes: Cervical, supraclavicular, and axillary nodes normal. Resp: clear to auscultation bilaterally Back: symmetric, no curvature. ROM normal. No CVA tenderness. Cardio: regular rate and rhythm, S1, S2 normal, no murmur, click, rub or gallop GI: soft, non-tender; bowel sounds normal; no masses,  no organomegaly Extremities: extremities normal, atraumatic, no cyanosis or edema  ECOG PERFORMANCE STATUS: 1 - Symptomatic but completely ambulatory  Blood pressure 129/62, pulse (!) 109, temperature 98.7 F (37.1 C), temperature source Oral, resp. rate 17, height '5\' 6"'  (1.676 m), weight 186 lb (84.4 kg), SpO2 100 %.  LABORATORY DATA: Lab Results  Component Value Date   WBC 21.8 (H) 01/21/2017   HGB 10.6 (L) 01/21/2017   HCT 32.9 (L) 01/21/2017   MCV 98.8 01/21/2017   PLT 164 01/21/2017      Chemistry      Component Value Date/Time   NA 140 01/14/2017 1123   K 3.9 01/14/2017 1123   CL 110 08/23/2016 1000   CL 105 06/17/2013 1441   CO2 27 01/14/2017 1123   BUN 15.8 01/14/2017 1123   CREATININE 0.9 01/14/2017 1123      Component Value Date/Time   CALCIUM 10.6 (H) 01/14/2017 1123   ALKPHOS 101 01/14/2017 1123   AST 64 (H) 01/14/2017 1123   ALT 56 (H) 01/14/2017 1123   BILITOT 1.08 01/14/2017 1123       RADIOGRAPHIC STUDIES: No results found.  ASSESSMENT AND PLAN:  This is a very pleasant 66 years old African-American female with recurrent multiple  myeloma. She is currently on treatment with Velcade and Decadron status post 30 weekly doses of Velcade. She is tolerating the treatment well with no significant adverse effect except for fatigue. I recommended for her to continue her treatment as scheduled. I will see her back for follow-up visit in 3 weeks for evaluation after repeating myeloma panel for restaging of her disease.  For the left forearm pain, I will order x-ray of the left forearm to rule out any fracture. For pain management she will continue on Vicodin and I gave her refill of her medication. The patient was advised to call immediately if she has any concerning symptoms in the interval. The patient voices understanding of current disease status and treatment options and is in agreement with the current care plan.  All questions were answered. The patient knows to call the clinic with any problems, questions or concerns. We can certainly see the patient much sooner if necessary. I spent 10 minutes counseling the patient face to face. The total time spent in the appointment was 15 minutes.  Disclaimer: This note was dictated with voice recognition software. Similar sounding words can inadvertently be transcribed and may not be corrected upon review.

## 2017-01-21 NOTE — Patient Instructions (Addendum)
[facility] Discharge Instructions for Patients Receiving Chemotherapy  Today you received the following chemotherapy agents:  Velcade  To help prevent nausea and vomiting after your treatment, we encourage you to take your nausea medication as prescribed.   If you develop nausea and vomiting that is not controlled by your nausea medication, call the clinic.   BELOW ARE SYMPTOMS THAT SHOULD BE REPORTED IMMEDIATELY:  *FEVER GREATER THAN 100.5 F  *CHILLS WITH OR WITHOUT FEVER  NAUSEA AND VOMITING THAT IS NOT CONTROLLED WITH YOUR NAUSEA MEDICATION  *UNUSUAL SHORTNESS OF BREATH  *UNUSUAL BRUISING OR BLEEDING  TENDERNESS IN MOUTH AND THROAT WITH OR WITHOUT PRESENCE OF ULCERS  *URINARY PROBLEMS  *BOWEL PROBLEMS  UNUSUAL RASH Items with * indicate a potential emergency and should be followed up as soon as possible.  Feel free to call the clinic you have any questions or concerns. The clinic phone number is [phone].  Please show the CHEMO ALERT CARD at check-in to the Emergency Department and triage nurse.   

## 2017-01-22 ENCOUNTER — Telehealth: Payer: Self-pay | Admitting: *Deleted

## 2017-01-22 NOTE — Telephone Encounter (Signed)
Per MD, notified pt she has Fracture to Left FA, referral has been made to orthopedics. Pt to expect a call to schedule further evaluation from ortho. Pt verbalized understanding.

## 2017-01-23 ENCOUNTER — Telehealth: Payer: Self-pay | Admitting: Internal Medicine

## 2017-01-23 ENCOUNTER — Telehealth: Payer: Self-pay | Admitting: *Deleted

## 2017-01-23 NOTE — Telephone Encounter (Signed)
Pt called inquired about Ortho Consult for left arm fracture. Pt referral was received by Dr. Benny Lennert office and will be reviewed. Their office will call pt with appt. Called pt, lm with pts family member.

## 2017-01-23 NOTE — Telephone Encounter (Signed)
[place] to sch appt per referral. RN wil have Dr. Apolonio Schneiders to review xray and they will call pt directly to sch appt. Informed [facility] desk nurse of call information

## 2017-01-25 DIAGNOSIS — C9000 Multiple myeloma not having achieved remission: Secondary | ICD-10-CM | POA: Diagnosis not present

## 2017-01-25 DIAGNOSIS — M84632A Pathological fracture in other disease, left ulna, initial encounter for fracture: Secondary | ICD-10-CM | POA: Diagnosis not present

## 2017-01-28 ENCOUNTER — Other Ambulatory Visit: Payer: Self-pay | Admitting: Internal Medicine

## 2017-01-28 ENCOUNTER — Other Ambulatory Visit (HOSPITAL_BASED_OUTPATIENT_CLINIC_OR_DEPARTMENT_OTHER): Payer: Medicare Other

## 2017-01-28 ENCOUNTER — Ambulatory Visit (HOSPITAL_BASED_OUTPATIENT_CLINIC_OR_DEPARTMENT_OTHER): Payer: Medicare Other

## 2017-01-28 ENCOUNTER — Ambulatory Visit: Payer: Medicare Other | Admitting: Gynecologic Oncology

## 2017-01-28 VITALS — BP 124/77 | HR 107 | Temp 98.3°F | Resp 18

## 2017-01-28 DIAGNOSIS — C9002 Multiple myeloma in relapse: Secondary | ICD-10-CM

## 2017-01-28 DIAGNOSIS — Z5112 Encounter for antineoplastic immunotherapy: Secondary | ICD-10-CM | POA: Diagnosis present

## 2017-01-28 LAB — CBC WITH DIFFERENTIAL/PLATELET
BASO%: 0 % (ref 0.0–2.0)
Basophils Absolute: 0 10*3/uL (ref 0.0–0.1)
EOS ABS: 0 10*3/uL (ref 0.0–0.5)
EOS%: 0 % (ref 0.0–7.0)
HCT: 32.1 % — ABNORMAL LOW (ref 34.8–46.6)
HEMOGLOBIN: 10.3 g/dL — AB (ref 11.6–15.9)
LYMPH%: 3.1 % — ABNORMAL LOW (ref 14.0–49.7)
MCH: 31.8 pg (ref 25.1–34.0)
MCHC: 32.1 g/dL (ref 31.5–36.0)
MCV: 99.1 fL (ref 79.5–101.0)
MONO#: 0.3 10*3/uL (ref 0.1–0.9)
MONO%: 1.5 % (ref 0.0–14.0)
NEUT%: 95.4 % — ABNORMAL HIGH (ref 38.4–76.8)
NEUTROS ABS: 19.6 10*3/uL — AB (ref 1.5–6.5)
PLATELETS: 171 10*3/uL (ref 145–400)
RBC: 3.24 10*6/uL — ABNORMAL LOW (ref 3.70–5.45)
RDW: 14.7 % — AB (ref 11.2–14.5)
WBC: 20.6 10*3/uL — AB (ref 3.9–10.3)
lymph#: 0.6 10*3/uL — ABNORMAL LOW (ref 0.9–3.3)

## 2017-01-28 LAB — COMPREHENSIVE METABOLIC PANEL
ALBUMIN: 3.2 g/dL — AB (ref 3.5–5.0)
ALT: 13 U/L (ref 0–55)
ANION GAP: 12 meq/L — AB (ref 3–11)
AST: 16 U/L (ref 5–34)
Alkaline Phosphatase: 94 U/L (ref 40–150)
BUN: 12.4 mg/dL (ref 7.0–26.0)
CHLORIDE: 99 meq/L (ref 98–109)
CO2: 29 meq/L (ref 22–29)
Calcium: 10.8 mg/dL — ABNORMAL HIGH (ref 8.4–10.4)
Creatinine: 1.1 mg/dL (ref 0.6–1.1)
EGFR: 60 mL/min/{1.73_m2} — AB (ref >90)
Glucose: 148 mg/dl — ABNORMAL HIGH (ref 70–140)
POTASSIUM: 3.7 meq/L (ref 3.5–5.1)
Sodium: 140 mEq/L (ref 136–145)
TOTAL PROTEIN: 7.2 g/dL (ref 6.4–8.3)
Total Bilirubin: 0.86 mg/dL (ref 0.20–1.20)

## 2017-01-28 MED ORDER — BORTEZOMIB CHEMO SQ INJECTION 3.5 MG (2.5MG/ML)
1.3000 mg/m2 | Freq: Once | INTRAMUSCULAR | Status: AC
  Administered 2017-01-28: 2.5 mg via SUBCUTANEOUS
  Filled 2017-01-28: qty 2.5

## 2017-01-28 MED ORDER — PROCHLORPERAZINE MALEATE 10 MG PO TABS
ORAL_TABLET | ORAL | Status: AC
  Filled 2017-01-28: qty 1

## 2017-01-28 MED ORDER — PROCHLORPERAZINE MALEATE 10 MG PO TABS
10.0000 mg | ORAL_TABLET | Freq: Once | ORAL | Status: AC
  Administered 2017-01-28: 10 mg via ORAL

## 2017-01-28 NOTE — Progress Notes (Signed)
Per Dr [name] ok to tx today with heart rate of 107.

## 2017-01-28 NOTE — Patient Instructions (Signed)
[facility] Discharge Instructions for Patients Receiving Chemotherapy  Today you received the following chemotherapy agents:  Velcade  To help prevent nausea and vomiting after your treatment, we encourage you to take your nausea medication as prescribed.   If you develop nausea and vomiting that is not controlled by your nausea medication, call the clinic.   BELOW ARE SYMPTOMS THAT SHOULD BE REPORTED IMMEDIATELY:  *FEVER GREATER THAN 100.5 F  *CHILLS WITH OR WITHOUT FEVER  NAUSEA AND VOMITING THAT IS NOT CONTROLLED WITH YOUR NAUSEA MEDICATION  *UNUSUAL SHORTNESS OF BREATH  *UNUSUAL BRUISING OR BLEEDING  TENDERNESS IN MOUTH AND THROAT WITH OR WITHOUT PRESENCE OF ULCERS  *URINARY PROBLEMS  *BOWEL PROBLEMS  UNUSUAL RASH Items with * indicate a potential emergency and should be followed up as soon as possible.  Feel free to call the clinic you have any questions or concerns. The clinic phone number is [phone].  Please show the CHEMO ALERT CARD at check-in to the Emergency Department and triage nurse.   

## 2017-01-29 ENCOUNTER — Other Ambulatory Visit: Payer: Self-pay | Admitting: Internal Medicine

## 2017-02-01 DIAGNOSIS — C9000 Multiple myeloma not having achieved remission: Secondary | ICD-10-CM | POA: Diagnosis not present

## 2017-02-01 DIAGNOSIS — M84634P Pathological fracture in other disease, left radius, subsequent encounter for fracture with malunion: Secondary | ICD-10-CM | POA: Diagnosis not present

## 2017-02-04 ENCOUNTER — Ambulatory Visit (HOSPITAL_BASED_OUTPATIENT_CLINIC_OR_DEPARTMENT_OTHER): Payer: Medicare Other

## 2017-02-04 ENCOUNTER — Other Ambulatory Visit (HOSPITAL_BASED_OUTPATIENT_CLINIC_OR_DEPARTMENT_OTHER): Payer: Medicare Other

## 2017-02-04 VITALS — BP 117/67 | HR 100 | Temp 98.0°F | Resp 18

## 2017-02-04 DIAGNOSIS — C9000 Multiple myeloma not having achieved remission: Secondary | ICD-10-CM | POA: Diagnosis not present

## 2017-02-04 DIAGNOSIS — C9002 Multiple myeloma in relapse: Secondary | ICD-10-CM | POA: Diagnosis present

## 2017-02-04 LAB — CBC WITH DIFFERENTIAL/PLATELET
BASO%: 0.2 % (ref 0.0–2.0)
Basophils Absolute: 0 10*3/uL (ref 0.0–0.1)
EOS%: 0.1 % (ref 0.0–7.0)
Eosinophils Absolute: 0 10*3/uL (ref 0.0–0.5)
HCT: 32.4 % — ABNORMAL LOW (ref 34.8–46.6)
HGB: 10.2 g/dL — ABNORMAL LOW (ref 11.6–15.9)
LYMPH%: 3 % — AB (ref 14.0–49.7)
MCH: 31.4 pg (ref 25.1–34.0)
MCHC: 31.6 g/dL (ref 31.5–36.0)
MCV: 99.3 fL (ref 79.5–101.0)
MONO#: 0.3 10*3/uL (ref 0.1–0.9)
MONO%: 1.6 % (ref 0.0–14.0)
NEUT%: 95.1 % — ABNORMAL HIGH (ref 38.4–76.8)
NEUTROS ABS: 14.8 10*3/uL — AB (ref 1.5–6.5)
Platelets: 159 10*3/uL (ref 145–400)
RBC: 3.26 10*6/uL — AB (ref 3.70–5.45)
RDW: 16.7 % — AB (ref 11.2–14.5)
WBC: 15.5 10*3/uL — AB (ref 3.9–10.3)
lymph#: 0.5 10*3/uL — ABNORMAL LOW (ref 0.9–3.3)

## 2017-02-04 LAB — COMPREHENSIVE METABOLIC PANEL
ALT: 15 U/L (ref 0–55)
AST: 20 U/L (ref 5–34)
Albumin: 3.3 g/dL — ABNORMAL LOW (ref 3.5–5.0)
Alkaline Phosphatase: 104 U/L (ref 40–150)
Anion Gap: 11 mEq/L (ref 3–11)
BILIRUBIN TOTAL: 0.69 mg/dL (ref 0.20–1.20)
BUN: 12.8 mg/dL (ref 7.0–26.0)
CO2: 27 meq/L (ref 22–29)
CREATININE: 1 mg/dL (ref 0.6–1.1)
Calcium: 11.1 mg/dL — ABNORMAL HIGH (ref 8.4–10.4)
Chloride: 101 mEq/L (ref 98–109)
EGFR: 66 mL/min/{1.73_m2} — ABNORMAL LOW (ref >90)
GLUCOSE: 151 mg/dL — AB (ref 70–140)
Potassium: 3.5 mEq/L (ref 3.5–5.1)
SODIUM: 139 meq/L (ref 136–145)
Total Protein: 7.2 g/dL (ref 6.4–8.3)

## 2017-02-04 LAB — LACTATE DEHYDROGENASE: LDH: 195 U/L (ref 125–245)

## 2017-02-04 MED ORDER — BORTEZOMIB CHEMO SQ INJECTION 3.5 MG (2.5MG/ML)
1.3000 mg/m2 | Freq: Once | INTRAMUSCULAR | Status: DC
  Filled 2017-02-04: qty 1

## 2017-02-04 MED ORDER — PROCHLORPERAZINE MALEATE 10 MG PO TABS
10.0000 mg | ORAL_TABLET | Freq: Once | ORAL | Status: AC
Start: 2017-02-04 — End: 2017-02-04
  Administered 2017-02-04: 10 mg via ORAL

## 2017-02-04 NOTE — Patient Instructions (Addendum)
[facility] Discharge Instructions for Patients Receiving Chemotherapy   To help prevent nausea and vomiting after your treatment, we encourage you to take your nausea medication    If you develop nausea and vomiting that is not controlled by your nausea medication, call the clinic.   BELOW ARE SYMPTOMS THAT SHOULD BE REPORTED IMMEDIATELY:  *FEVER GREATER THAN 100.5 F  *CHILLS WITH OR WITHOUT FEVER  NAUSEA AND VOMITING THAT IS NOT CONTROLLED WITH YOUR NAUSEA MEDICATION  *UNUSUAL SHORTNESS OF BREATH  *UNUSUAL BRUISING OR BLEEDING  TENDERNESS IN MOUTH AND THROAT WITH OR WITHOUT PRESENCE OF ULCERS  *URINARY PROBLEMS  *BOWEL PROBLEMS  UNUSUAL RASH Items with * indicate a potential emergency and should be followed up as soon as possible.  Feel free to call the clinic you have any questions or concerns. The clinic phone number is [phone].  Please show the [place] at check-in to the Emergency Department and triage nurse.

## 2017-02-04 NOTE — Progress Notes (Signed)
Per Dr. [name], pt will not get treated today due to upcoming surgery.  Pharmacy notified. Pt verbalized understanding.  Schedule and copy of labs given to patient prior to discharge.  Ambulatory with friend at discharge in no distress.

## 2017-02-05 ENCOUNTER — Telehealth: Payer: Self-pay | Admitting: Internal Medicine

## 2017-02-05 LAB — BETA 2 MICROGLOBULIN, SERUM: BETA 2: 2.7 mg/L — AB (ref 0.6–2.4)

## 2017-02-05 LAB — KAPPA/LAMBDA LIGHT CHAINS
Ig Kappa Free Light Chain: 10.3 mg/L (ref 3.3–19.4)
Ig Lambda Free Light Chain: 2 mg/L — ABNORMAL LOW (ref 5.7–26.3)
KAPPA/LAMBDA FLC RATIO: 5.15 — AB (ref 0.26–1.65)

## 2017-02-05 LAB — IGG, IGA, IGM
IGA/IMMUNOGLOBULIN A, SERUM: 898 mg/dL — AB (ref 87–352)
IGG (IMMUNOGLOBIN G), SERUM: 207 mg/dL — AB (ref 700–1600)
IgM, Qn, Serum: 14 mg/dL — ABNORMAL LOW (ref 26–217)

## 2017-02-05 NOTE — Telephone Encounter (Signed)
Faxed office note to [facility] °

## 2017-02-06 DIAGNOSIS — M84634A Pathological fracture in other disease, left radius, initial encounter for fracture: Secondary | ICD-10-CM | POA: Diagnosis not present

## 2017-02-06 DIAGNOSIS — S52392A Other fracture of shaft of radius, left arm, initial encounter for closed fracture: Secondary | ICD-10-CM | POA: Diagnosis not present

## 2017-02-06 DIAGNOSIS — G8918 Other acute postprocedural pain: Secondary | ICD-10-CM | POA: Diagnosis not present

## 2017-02-06 DIAGNOSIS — M84534A Pathological fracture in neoplastic disease, left radius, initial encounter for fracture: Secondary | ICD-10-CM | POA: Diagnosis not present

## 2017-02-11 ENCOUNTER — Encounter: Payer: Self-pay | Admitting: Internal Medicine

## 2017-02-11 ENCOUNTER — Telehealth: Payer: Self-pay | Admitting: Internal Medicine

## 2017-02-11 ENCOUNTER — Ambulatory Visit: Payer: Medicare Other

## 2017-02-11 ENCOUNTER — Other Ambulatory Visit (HOSPITAL_BASED_OUTPATIENT_CLINIC_OR_DEPARTMENT_OTHER): Payer: Medicare Other

## 2017-02-11 ENCOUNTER — Ambulatory Visit (HOSPITAL_BASED_OUTPATIENT_CLINIC_OR_DEPARTMENT_OTHER): Payer: Medicare Other | Admitting: Internal Medicine

## 2017-02-11 VITALS — BP 143/75 | HR 121 | Temp 98.4°F | Resp 18 | Wt 183.4 lb

## 2017-02-11 DIAGNOSIS — C9002 Multiple myeloma in relapse: Secondary | ICD-10-CM

## 2017-02-11 DIAGNOSIS — S52322A Displaced transverse fracture of shaft of left radius, initial encounter for closed fracture: Secondary | ICD-10-CM

## 2017-02-11 DIAGNOSIS — Z9484 Stem cells transplant status: Secondary | ICD-10-CM

## 2017-02-11 DIAGNOSIS — C9000 Multiple myeloma not having achieved remission: Secondary | ICD-10-CM

## 2017-02-11 DIAGNOSIS — D638 Anemia in other chronic diseases classified elsewhere: Secondary | ICD-10-CM

## 2017-02-11 DIAGNOSIS — E876 Hypokalemia: Secondary | ICD-10-CM | POA: Diagnosis not present

## 2017-02-11 DIAGNOSIS — Z5111 Encounter for antineoplastic chemotherapy: Secondary | ICD-10-CM

## 2017-02-11 LAB — CBC WITH DIFFERENTIAL/PLATELET
BASO%: 0.1 % (ref 0.0–2.0)
Basophils Absolute: 0 10*3/uL (ref 0.0–0.1)
EOS%: 0.4 % (ref 0.0–7.0)
Eosinophils Absolute: 0.1 10*3/uL (ref 0.0–0.5)
HCT: 31.5 % — ABNORMAL LOW (ref 34.8–46.6)
HEMOGLOBIN: 10 g/dL — AB (ref 11.6–15.9)
LYMPH%: 2.8 % — AB (ref 14.0–49.7)
MCH: 31.2 pg (ref 25.1–34.0)
MCHC: 31.7 g/dL (ref 31.5–36.0)
MCV: 98.1 fL (ref 79.5–101.0)
MONO#: 0.7 10*3/uL (ref 0.1–0.9)
MONO%: 3.4 % (ref 0.0–14.0)
NEUT%: 93.3 % — ABNORMAL HIGH (ref 38.4–76.8)
NEUTROS ABS: 18.4 10*3/uL — AB (ref 1.5–6.5)
Platelets: 219 10*3/uL (ref 145–400)
RBC: 3.21 10*6/uL — AB (ref 3.70–5.45)
RDW: 14.9 % — AB (ref 11.2–14.5)
WBC: 19.8 10*3/uL — AB (ref 3.9–10.3)
lymph#: 0.6 10*3/uL — ABNORMAL LOW (ref 0.9–3.3)

## 2017-02-11 LAB — COMPREHENSIVE METABOLIC PANEL
ALBUMIN: 2.8 g/dL — AB (ref 3.5–5.0)
ALK PHOS: 93 U/L (ref 40–150)
ALT: 8 U/L (ref 0–55)
AST: 14 U/L (ref 5–34)
Anion Gap: 12 mEq/L — ABNORMAL HIGH (ref 3–11)
BILIRUBIN TOTAL: 0.93 mg/dL (ref 0.20–1.20)
BUN: 7.5 mg/dL (ref 7.0–26.0)
CO2: 30 meq/L — AB (ref 22–29)
Calcium: 10.5 mg/dL — ABNORMAL HIGH (ref 8.4–10.4)
Chloride: 100 mEq/L (ref 98–109)
Creatinine: 1 mg/dL (ref 0.6–1.1)
EGFR: 69 mL/min/{1.73_m2} — ABNORMAL LOW (ref >90)
GLUCOSE: 164 mg/dL — AB (ref 70–140)
POTASSIUM: 2.8 meq/L — AB (ref 3.5–5.1)
SODIUM: 141 meq/L (ref 136–145)
TOTAL PROTEIN: 7 g/dL (ref 6.4–8.3)

## 2017-02-11 NOTE — Progress Notes (Signed)
[place] [phone]   [phone]  OFFICE PROGRESS NOTE  [name], MD [address] [place] [address]  DIAGNOSIS: Stage II a high IgA kappa multiple myeloma diagnosed in 2006.  PRIOR THERAPY: 1) status post treatment with thalidomide and Decadron followed by autologous peripheral blood stem cell transplant with high-dose melphalan on 04/26/2006 at [facility].  2) the patient had evidence for disease recurrence in August of 2008 and she was treated with 6 cycles of Velcade completed on 03/30/2008 with response to the treatment but was discontinued secondary to neuropathy.  3) maintenance treatment with Revlimid 10 mg by mouth daily by the does was later increase to 15 mg by mouth daily secondary to biochemical recurrence with stabilization of her disease. The treatment with Revlimid 15 mg by mouth daily was discontinued secondary to evidence for disease progression. 4) Revlimid 25 mg by mouth daily for 3 weeks every 4 weeks in addition to Decadron currently at 20 mg by mouth on a weekly basis. She is status post 12 cycles. This was discontinued secondary to disease progression. 5) systemic chemotherapy with Daratumumab 16 MG/KG weekly for the first 9 weeks, Velcade 1.3 MG/M2 subcutaneously on days 1, 4, 8 and 11 every 3 weeks in addition to Decadron 20 mg by mouth on days 1, 2, 4, 5, 8, 9, 11 and 12 every 3 weeks. First dose 05/07/2016. Status post one cycle discontinued secondary to intolerance.  CURRENT THERAPY: 1) systemic chemotherapy with Velcade 1.3 MG/M2 subcutaneously weekly in addition to Decadron 40 mg by mouth daily weekly status post 33 cycles. 2) Zometa 4 mg IV every 3 months. First dose 05/05/2015.  INTERVAL HISTORY: Darlene Howard 66 y.o. female came to the clinic today for follow-up visit accompanied by her brother. The patient is feeling fine today with no specific complaints except for mild fatigue. She  recently underwent surgical fixation of a displaced fracture along the upper metaphysis of the radius. This was done 5 days ago. She denied having any chest pain, shortness of breath, cough or hemoptysis. She has no weight loss or night sweats. She has no nausea, vomiting, diarrhea or constipation. The patient denied having any fever or chills. She had repeat myeloma panel performed recently and she is here for evaluation and discussion of her lab results and recommendation regarding treatment of her condition.  MEDICAL HISTORY: Past Medical History:  Diagnosis Date  . Bruises easily    d/t being on COumadin  . Cholelithiasis   . DDD (degenerative disc disease), lumbar   . Fatty liver   . GERD (gastroesophageal reflux disease)    takes Pantoprazole daily  . Glaucoma    uses eye drops as instructed  . H/O stem cell transplant ([place])    04-26-2006  AT [facility]  . Herpes    takes Valtrex as needed  . History of colon polyps   . Hypertension    takes Amlodipine and Diovan daily  . Left radial fracture 01/21/2017  . Lung cancer ([place])    chest wall lesion aadn 6th rib  . Multiple myeloma DX  2006  STATE IIA,  IgA  KAPPA   STABLE  PER DR Leanda Padmore--  S/P STEM CELL TRANSPLANT 2007 /  RECURRENCE 2008  CHEMO ENDED 03-30-2008/   NOW ON MAINTANANCE REVLIMID )  . 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 Personal history of colon  cancer    ASCENDING COLON--  S/P  RIGHT HEMICOLECTOMY WITH NEGATIVE NODES/    NO RECURRENCE  . Type 2 diabetes mellitus ([place])   . Vocal cord nodule    resolved- see Dr. Deeann Saint notes in media tab 06/17/14    ALLERGIES:  is allergic to codeine and tramadol.  MEDICATIONS:  Current Outpatient Prescriptions  Medication Sig Dispense Refill  . acetaminophen (TYLENOL) 500 MG tablet Take 500 mg by mouth daily as needed for moderate pain. Reported on 06/25/2016    . aspirin (ASPIRIN LOW DOSE) 81 MG EC tablet Take 81 mg by  mouth daily.      . benzonatate (TESSALON) 100 MG capsule Take 1 capsule (100 mg total) by mouth 3 (three) times daily as needed for cough. 30 capsule 0  . calcium-vitamin D (OSCAL 500/200 D-3) 500-200 MG-UNIT per tablet Take 1 tablet by mouth 3 (three) times daily.      . cholecalciferol (VITAMIN D) 1000 UNITS tablet Take 1,000 Units by mouth daily. Reported on 04/24/2016    . dexamethasone (DECADRON) 4 MG tablet TAKE 10 TABS ONCE WEEKLY  WHILE UNDERGOING VELCADE  INJECTIONS. 120 tablet 0  . dorzolamide-timolol (COSOPT) 22.3-6.8 MG/ML ophthalmic solution Place 1 drop into the left eye 2 (two) times daily.     Marland Kitchen gabapentin (NEURONTIN) 100 MG capsule TAKE 1 CAPSULE BY MOUTH 3  TIMES DAILY 270 capsule 1  . loratadine (CLARITIN) 10 MG tablet Take 1 tablet (10 mg total) by mouth daily. 30 tablet 5  . LUMIGAN 0.01 % SOLN Place 1 drop into both eyes at bedtime.     . Multiple Vitamins-Minerals (CENTRUM SILVER PO) Take 1 tablet by mouth daily.     Marland Kitchen oxyCODONE-acetaminophen (PERCOCET) 10-325 MG tablet Take 1 tablet by mouth every 6 (six) hours as needed.    . pantoprazole (PROTONIX) 40 MG tablet TAKE 1 TABLET BY MOUTH  DAILY FOR ACID REFLUX 90 tablet 1  . potassium chloride SA (K-DUR,KLOR-CON) 20 MEQ tablet TAKE 1 TABLET BY MOUTH  TWICE A DAY 180 tablet 1  . PREMARIN vaginal cream PLACE 1 APPLICATORFUL VAGINALLY TWICE A WEEK. 30 g 0  . temazepam (RESTORIL) 15 MG capsule TAKE 1 CAPSULE AT BEDTIME AS NEEDED FOR SLEEP. 30 capsule 2  . valACYclovir (VALTREX) 1000 MG tablet TAKE 1 TABLET BY MOUTH  DAILY AS NEEDED 90 tablet 0  . valsartan-hydrochlorothiazide (DIOVAN-HCT) 320-25 MG tablet Take 1 tablet by mouth  daily 90 tablet 1  . loperamide (IMODIUM A-D) 2 MG tablet Take 2 mg by mouth daily as needed for diarrhea or loose stools. Reported on 07/09/2016    . ondansetron (ZOFRAN) 4 MG tablet Take 1 tablet (4 mg total) by mouth every 6 (six) hours. (Patient not taking: Reported on 02/11/2017) 20 tablet 0  .  oxyCODONE-acetaminophen (PERCOCET/ROXICET) 5-325 MG tablet Take 1 tablet by mouth every 8 (eight) hours as needed for severe pain. (Patient not taking: Reported on 02/11/2017) 40 tablet 0  . promethazine-dextromethorphan (PROMETHAZINE-DM) 6.25-15 MG/5ML syrup One teaspoon at bedtime as needed, for excess cough (Patient not taking: Reported on 02/11/2017) 240 mL 0   No current facility-administered medications for this visit.     SURGICAL HISTORY:  Past Surgical History:  Procedure Laterality Date  . ABDOMINAL HYSTERECTOMY  1983   w/  appendectomy and left salpingoophorectom  . CATARACT EXTRACTION W/PHACO Left 01/20/2014   Procedure: CATARACT EXTRACTION PHACO AND INTRAOCULAR LENS PLACEMENT (IOC);  Surgeon: Adonis Brook, MD;  Location: [place];  Service:  Ophthalmology;  Laterality: Left;  . CERVICAL CONE BIOPSY    . CHOLECYSTECTOMY N/A 02/28/2016   Procedure: LAPAROSCOPIC CHOLECYSTECTOMY;  Surgeon: Ralene Ok, MD;  Location: [place] [facility];  Service: General;  Laterality: N/A;  . CO2  ABLATION VULVA PERIANAL AND VAGINAL DYSPLATIC AREAS  02-15-2010  . COLONOSCOPY  10/07/2006   WCB:JSEGBT rectum/Status post right hemicolectomy. Normal residual colon  . COLONOSCOPY  10/2009   RMR: diminutive RS polyp (hyperplastic).   . COLONOSCOPY N/A 12/15/2014   Procedure: COLONOSCOPY;  Surgeon: Daneil Dolin, MD;  [facility];  Service: Endoscopy;  Laterality: N/A;  130   . ESOPHAGOGASTRODUODENOSCOPY  04/02/2005   DVV:OHYWVP/XTGGYIRSWNIO polyp with central erosion versus ulceration in the body of the stomach, resected and recovered Remainder of the gastric mucosa, D1, D2 appeared normal  . HEMICOLECTOMY Right 04-27-2005   CARCINOMA   ASCENDING COLON  . LESION REMOVAL N/A 05/06/2014   Procedure: LASER OF THE VAGINA;  Surgeon: Janie Morning, MD;  Location: [facility];  Service: Gynecology;  Laterality: N/A;  . LIMBAL STEM CELL TRANSPLANT  04-26-2006    ([facility])  . VULVAR LESION REMOVAL N/A  05/06/2014   Procedure: LASER OF VULVAR LESION;  Surgeon: Janie Morning, MD;  Location: [facility];  Service: Gynecology;  Laterality: N/A;  . VULVECTOMY N/A 05/06/2014   Procedure: WIDE LOCAL EXCISION LEFT LABIA MAJORA;  Surgeon: Janie Morning, MD;  Location: [place];  Service: Gynecology;  Laterality: N/A;    REVIEW OF SYSTEMS:  Constitutional: positive for fatigue Eyes: negative Ears, nose, mouth, throat, and face: negative Respiratory: negative Cardiovascular: negative Gastrointestinal: negative Genitourinary:negative Integument/breast: negative Hematologic/lymphatic: negative Musculoskeletal:positive for bone pain Neurological: negative Behavioral/Psych: negative Endocrine: negative Allergic/Immunologic: negative   PHYSICAL EXAMINATION: General appearance: alert, cooperative, fatigued and no distress Head: Normocephalic, without obvious abnormality, atraumatic Neck: no adenopathy, no JVD, supple, symmetrical, trachea midline and thyroid not enlarged, symmetric, no tenderness/mass/nodules Lymph nodes: Cervical, supraclavicular, and axillary nodes normal. Resp: clear to auscultation bilaterally Back: symmetric, no curvature. ROM normal. No CVA tenderness. Cardio: regular rate and rhythm, S1, S2 normal, no murmur, click, rub or gallop GI: soft, non-tender; bowel sounds normal; no masses,  no organomegaly Extremities: extremities normal, atraumatic, no cyanosis or edema Neurologic: Alert and oriented X 3, normal strength and tone. Normal symmetric reflexes. Normal coordination and gait  ECOG PERFORMANCE STATUS: 1 - Symptomatic but completely ambulatory  Blood pressure (!) 143/75, pulse (!) 121, temperature 98.4 F (36.9 C), temperature source Oral, resp. rate 18, weight 183 lb 6.4 oz (83.2 kg), SpO2 95 %.  LABORATORY DATA: Lab Results  Component Value Date   WBC 19.8 (H) 02/11/2017   HGB 10.0 (L) 02/11/2017   HCT 31.5 (L) 02/11/2017   MCV  98.1 02/11/2017   PLT 219 02/11/2017      Chemistry      Component Value Date/Time   NA 141 02/11/2017 0846   K 2.8 (LL) 02/11/2017 0846   CL 110 08/23/2016 1000   CL 105 06/17/2013 1441   CO2 30 (H) 02/11/2017 0846   BUN 7.5 02/11/2017 0846   CREATININE 1.0 02/11/2017 0846      Component Value Date/Time   CALCIUM 10.5 (H) 02/11/2017 0846   ALKPHOS 93 02/11/2017 0846   AST 14 02/11/2017 0846   ALT 8 02/11/2017 0846   BILITOT 0.93 02/11/2017 0846       RADIOGRAPHIC STUDIES: Dg Forearm Left  Result Date: 01/21/2017 CLINICAL DATA:  Left forearm pain.  Injury. EXAM: LEFT FOREARM - 2 VIEW COMPARISON:  No recent prior. FINDINGS: A fracture along the upper diaphysis of the radius is noted. The fracture is displaced. A lytic lesion at the fracture site appears to be present suggesting a pathologic fracture. Small lytic lesions are noted throughout the radius, ulna, and distal humerus. These findings are consistent with metastatic disease and/or myeloma . IMPRESSION: 1. A displaced fracture is noted along the upper diaphysis of the radius. A lytic lesion is noted at the fracture site suggesting pathologic fracture. 2. Multiple lytic lesions are noted throughout the radius, ulna, distal humerus. These findings are consistent with metastatic disease and/or myeloma . Electronically Signed   By: Marcello Moores  Register   On: 01/21/2017 11:09    ASSESSMENT AND PLAN:  This is a very pleasant 66 years old African-American female with relapsed multiple myeloma. The patient is currently on treatment with Velcade and Decadron status post 33 cycles. She has been tolerating her treatment well with no significant adverse effects. The recent myeloma panel showed mild increase in the IgA. I discussed the lab result with the patient and her brother. I recommended for her to continue her current treatment with Velcade and Decadron for now but I will delay the start of the next dose of her treatment until next  week to give him more time to recover and heal from the recent surgery for the radial fracture. For the recent displaced fracture, the patient underwent surgical intervention with fixation. We will continue to monitor closely. The patient will also continue on treatment with Zometa for the bone disease. For the hypokalemia, I advised the patient to increase the potassium chloride 40 mEq by mouth daily. For the anemia of chronic disease, her hemoglobin and hematocrit are stable. We will continue to monitor for now. She will come back for follow-up visit in 4 weeks for evaluation and management of any adverse effect of her treatment. She was advised to call immediately if she has any concerning symptoms in the interval. The patient voices understanding of current disease status and treatment options and is in agreement with the current care plan.  All questions were answered. The patient knows to call the clinic with any problems, questions or concerns. We can certainly see the patient much sooner if necessary.  Disclaimer: This note was dictated with voice recognition software. Similar sounding words can inadvertently be transcribed and may not be corrected upon review.

## 2017-02-11 NOTE — Telephone Encounter (Signed)
Appointments scheduled per 2/12 LOS. Patient given AVS report and calendars with future scheduled appointments.

## 2017-02-12 ENCOUNTER — Telehealth: Payer: Self-pay | Admitting: Internal Medicine

## 2017-02-12 NOTE — Telephone Encounter (Signed)
Patients was here yesterday to see the Dr and was able to schedule her next appointments with him but was unable to schedule her infusions and would like to get that done her call back number is [phone]

## 2017-02-13 ENCOUNTER — Telehealth: Payer: Self-pay | Admitting: Internal Medicine

## 2017-02-13 ENCOUNTER — Telehealth: Payer: Self-pay | Admitting: *Deleted

## 2017-02-13 NOTE — Telephone Encounter (Signed)
Would like to get her chemo treatment done on Monday 2/19 on the same day as her other appointments along with the other appointments she has scheduled Call back [phone]

## 2017-02-13 NOTE — Telephone Encounter (Signed)
Per 2/12 LOS and staff message I have scheduled appt. Notified the scheduler of appts

## 2017-02-14 ENCOUNTER — Telehealth: Payer: Self-pay | Admitting: *Deleted

## 2017-02-14 NOTE — Telephone Encounter (Signed)
I have called and gave her the date/time for Monday

## 2017-02-18 ENCOUNTER — Other Ambulatory Visit (HOSPITAL_BASED_OUTPATIENT_CLINIC_OR_DEPARTMENT_OTHER): Payer: Medicare Other

## 2017-02-18 ENCOUNTER — Ambulatory Visit (HOSPITAL_BASED_OUTPATIENT_CLINIC_OR_DEPARTMENT_OTHER): Payer: Medicare Other

## 2017-02-18 VITALS — BP 134/59 | HR 119 | Temp 98.7°F | Resp 18

## 2017-02-18 DIAGNOSIS — Z5112 Encounter for antineoplastic immunotherapy: Secondary | ICD-10-CM

## 2017-02-18 DIAGNOSIS — C9002 Multiple myeloma in relapse: Secondary | ICD-10-CM

## 2017-02-18 LAB — CBC WITH DIFFERENTIAL/PLATELET
BASO%: 0 % (ref 0.0–2.0)
Basophils Absolute: 0 10*3/uL (ref 0.0–0.1)
EOS ABS: 0 10*3/uL (ref 0.0–0.5)
EOS%: 0.1 % (ref 0.0–7.0)
HEMATOCRIT: 30.8 % — AB (ref 34.8–46.6)
HEMOGLOBIN: 9.7 g/dL — AB (ref 11.6–15.9)
LYMPH#: 1.2 10*3/uL (ref 0.9–3.3)
LYMPH%: 5.1 % — AB (ref 14.0–49.7)
MCH: 30.6 pg (ref 25.1–34.0)
MCHC: 31.5 g/dL (ref 31.5–36.0)
MCV: 97.2 fL (ref 79.5–101.0)
MONO#: 1.7 10*3/uL — AB (ref 0.1–0.9)
MONO%: 7.2 % (ref 0.0–14.0)
NEUT%: 87.6 % — ABNORMAL HIGH (ref 38.4–76.8)
NEUTROS ABS: 20.3 10*3/uL — AB (ref 1.5–6.5)
PLATELETS: 217 10*3/uL (ref 145–400)
RBC: 3.17 10*6/uL — AB (ref 3.70–5.45)
RDW: 15.2 % — ABNORMAL HIGH (ref 11.2–14.5)
WBC: 23.2 10*3/uL — AB (ref 3.9–10.3)

## 2017-02-18 LAB — COMPREHENSIVE METABOLIC PANEL
ALBUMIN: 2.6 g/dL — AB (ref 3.5–5.0)
ALK PHOS: 101 U/L (ref 40–150)
ALT: 11 U/L (ref 0–55)
ANION GAP: 13 meq/L — AB (ref 3–11)
AST: 16 U/L (ref 5–34)
BILIRUBIN TOTAL: 1.36 mg/dL — AB (ref 0.20–1.20)
BUN: 12.2 mg/dL (ref 7.0–26.0)
CALCIUM: 11.4 mg/dL — AB (ref 8.4–10.4)
CO2: 26 mEq/L (ref 22–29)
CREATININE: 0.9 mg/dL (ref 0.6–1.1)
Chloride: 100 mEq/L (ref 98–109)
EGFR: 82 mL/min/{1.73_m2} — AB (ref >90)
Glucose: 132 mg/dl (ref 70–140)
Potassium: 3.9 mEq/L (ref 3.5–5.1)
Sodium: 139 mEq/L (ref 136–145)
TOTAL PROTEIN: 7.5 g/dL (ref 6.4–8.3)

## 2017-02-18 MED ORDER — PROCHLORPERAZINE MALEATE 10 MG PO TABS
ORAL_TABLET | ORAL | Status: AC
  Filled 2017-02-18: qty 1

## 2017-02-18 MED ORDER — BORTEZOMIB CHEMO SQ INJECTION 3.5 MG (2.5MG/ML)
1.3000 mg/m2 | Freq: Once | INTRAMUSCULAR | Status: AC
  Administered 2017-02-18: 2.5 mg via SUBCUTANEOUS
  Filled 2017-02-18: qty 2.5

## 2017-02-18 MED ORDER — PROCHLORPERAZINE MALEATE 10 MG PO TABS
10.0000 mg | ORAL_TABLET | Freq: Once | ORAL | Status: AC
  Administered 2017-02-18: 10 mg via ORAL

## 2017-02-18 NOTE — Patient Instructions (Signed)
[facility] Discharge Instructions for Patients Receiving Chemotherapy  Today you received the following chemotherapy agents Velcade. To help prevent nausea and vomiting after your treatment, we encourage you to take your nausea medication as directed.  If you develop nausea and vomiting that is not controlled by your nausea medication, call the clinic.   BELOW ARE SYMPTOMS THAT SHOULD BE REPORTED IMMEDIATELY:  *FEVER GREATER THAN 100.5 F  *CHILLS WITH OR WITHOUT FEVER  NAUSEA AND VOMITING THAT IS NOT CONTROLLED WITH YOUR NAUSEA MEDICATION  *UNUSUAL SHORTNESS OF BREATH  *UNUSUAL BRUISING OR BLEEDING  TENDERNESS IN MOUTH AND THROAT WITH OR WITHOUT PRESENCE OF ULCERS  *URINARY PROBLEMS  *BOWEL PROBLEMS  UNUSUAL RASH Items with * indicate a potential emergency and should be followed up as soon as possible.  Feel free to call the clinic you have any questions or concerns. The clinic phone number is [phone].  Please show the CHEMO ALERT CARD at check-in to the Emergency Department and triage nurse.    

## 2017-02-18 NOTE — Progress Notes (Signed)
02/18/2017 Per Dr. [name] ok to treat with heart rate.

## 2017-02-21 ENCOUNTER — Ambulatory Visit: Payer: Medicare Other | Admitting: Family Medicine

## 2017-02-25 ENCOUNTER — Other Ambulatory Visit: Payer: Self-pay | Admitting: Medical Oncology

## 2017-02-25 ENCOUNTER — Other Ambulatory Visit (HOSPITAL_BASED_OUTPATIENT_CLINIC_OR_DEPARTMENT_OTHER): Payer: Medicare Other

## 2017-02-25 ENCOUNTER — Other Ambulatory Visit: Payer: Self-pay | Admitting: Internal Medicine

## 2017-02-25 ENCOUNTER — Ambulatory Visit (HOSPITAL_BASED_OUTPATIENT_CLINIC_OR_DEPARTMENT_OTHER): Payer: Medicare Other

## 2017-02-25 ENCOUNTER — Telehealth: Payer: Self-pay | Admitting: *Deleted

## 2017-02-25 VITALS — BP 119/60 | HR 130 | Temp 98.1°F | Resp 20

## 2017-02-25 DIAGNOSIS — C9002 Multiple myeloma in relapse: Secondary | ICD-10-CM

## 2017-02-25 DIAGNOSIS — Z5112 Encounter for antineoplastic immunotherapy: Secondary | ICD-10-CM

## 2017-02-25 LAB — COMPREHENSIVE METABOLIC PANEL
ALT: 9 U/L (ref 0–55)
ANION GAP: 8 meq/L (ref 3–11)
AST: 20 U/L (ref 5–34)
Albumin: 2.7 g/dL — ABNORMAL LOW (ref 3.5–5.0)
Alkaline Phosphatase: 97 U/L (ref 40–150)
BUN: 10.5 mg/dL (ref 7.0–26.0)
CHLORIDE: 102 meq/L (ref 98–109)
CO2: 27 meq/L (ref 22–29)
Calcium: 12.7 mg/dL — ABNORMAL HIGH (ref 8.4–10.4)
Creatinine: 0.9 mg/dL (ref 0.6–1.1)
EGFR: 74 mL/min/{1.73_m2} — AB (ref >90)
Glucose: 119 mg/dl (ref 70–140)
Potassium: 4.7 mEq/L (ref 3.5–5.1)
Sodium: 137 mEq/L (ref 136–145)
Total Bilirubin: 0.78 mg/dL (ref 0.20–1.20)
Total Protein: 7.4 g/dL (ref 6.4–8.3)

## 2017-02-25 LAB — CBC WITH DIFFERENTIAL/PLATELET
BASO%: 0.4 % (ref 0.0–2.0)
BASOS ABS: 0.1 10*3/uL (ref 0.0–0.1)
EOS ABS: 0 10*3/uL (ref 0.0–0.5)
EOS%: 0.1 % (ref 0.0–7.0)
HCT: 30.4 % — ABNORMAL LOW (ref 34.8–46.6)
HGB: 9.6 g/dL — ABNORMAL LOW (ref 11.6–15.9)
LYMPH%: 4.2 % — AB (ref 14.0–49.7)
MCH: 30.6 pg (ref 25.1–34.0)
MCHC: 31.5 g/dL (ref 31.5–36.0)
MCV: 97 fL (ref 79.5–101.0)
MONO#: 3.3 10*3/uL — ABNORMAL HIGH (ref 0.1–0.9)
MONO%: 13.7 % (ref 0.0–14.0)
NEUT#: 19.6 10*3/uL — ABNORMAL HIGH (ref 1.5–6.5)
NEUT%: 81.6 % — AB (ref 38.4–76.8)
Platelets: 216 10*3/uL (ref 145–400)
RBC: 3.13 10*6/uL — AB (ref 3.70–5.45)
RDW: 17.7 % — ABNORMAL HIGH (ref 11.2–14.5)
WBC: 24 10*3/uL — ABNORMAL HIGH (ref 3.9–10.3)
lymph#: 1 10*3/uL (ref 0.9–3.3)

## 2017-02-25 MED ORDER — SODIUM CHLORIDE 0.9 % IV SOLN
Freq: Once | INTRAVENOUS | Status: AC
  Administered 2017-02-25: 14:00:00 via INTRAVENOUS

## 2017-02-25 MED ORDER — PROCHLORPERAZINE MALEATE 10 MG PO TABS
10.0000 mg | ORAL_TABLET | Freq: Once | ORAL | Status: AC
  Administered 2017-02-25: 10 mg via ORAL

## 2017-02-25 MED ORDER — OXYCODONE-ACETAMINOPHEN 5-325 MG PO TABS: 1.0000 | ORAL_TABLET | Freq: Three times a day (TID) | ORAL | 0 refills | Status: DC | PRN

## 2017-02-25 MED ORDER — BORTEZOMIB CHEMO SQ INJECTION 3.5 MG (2.5MG/ML)
1.3000 mg/m2 | Freq: Once | INTRAMUSCULAR | Status: AC
  Administered 2017-02-25: 2.5 mg via SUBCUTANEOUS
  Filled 2017-02-25: qty 2.5

## 2017-02-25 MED ORDER — PROCHLORPERAZINE MALEATE 10 MG PO TABS
ORAL_TABLET | ORAL | Status: AC
  Filled 2017-02-25: qty 1

## 2017-02-25 NOTE — Telephone Encounter (Signed)
Called pt with instructions to come to [facility] for Zometa infusion on 2/27, per Dr. [name]. Pt voiced understanding, she has appt with ortho at 71. She will call for later time if needed.

## 2017-02-25 NOTE — Progress Notes (Signed)
MD [facility] notified of VS today. NS bolus ordered (see eMAR). Ok to treat with VS today per MD [facility].

## 2017-02-25 NOTE — Patient Instructions (Signed)
[facility] Discharge Instructions for Patients Receiving Chemotherapy  Today you received the following chemotherapy agents Velcade  To help prevent nausea and vomiting after your treatment, we encourage you to take your nausea medication as directed.   If you develop nausea and vomiting that is not controlled by your nausea medication, call the clinic.   BELOW ARE SYMPTOMS THAT SHOULD BE REPORTED IMMEDIATELY:  *FEVER GREATER THAN 100.5 F  *CHILLS WITH OR WITHOUT FEVER  NAUSEA AND VOMITING THAT IS NOT CONTROLLED WITH YOUR NAUSEA MEDICATION  *UNUSUAL SHORTNESS OF BREATH  *UNUSUAL BRUISING OR BLEEDING  TENDERNESS IN MOUTH AND THROAT WITH OR WITHOUT PRESENCE OF ULCERS  *URINARY PROBLEMS  *BOWEL PROBLEMS  UNUSUAL RASH Items with * indicate a potential emergency and should be followed up as soon as possible.  Feel free to call the clinic you have any questions or concerns. The clinic phone number is [phone].  Please show the [place] at check-in to the Emergency Department and triage nurse.   Hypotension Hypotension, commonly called low blood pressure, is when the force of blood pumping through your arteries is too weak. Arteries are blood vessels that carry blood from the heart throughout the body. When blood pressure is too low, you may not get enough blood to your brain or to the rest of your organs. This can cause weakness, light-headedness, rapid heartbeat, and fainting. Depending on the cause and severity, hypotension may be harmless (benign) or cause serious problems (critical). What are the causes? Possible causes of hypertension include:  Blood loss.  Loss of body fluids (dehydration).  Heart problems.  Hormone (endocrine) problems.  Pregnancy.  Severe infection.  Lack of certain nutrients.  Severe allergic reactions (anaphylaxis).  Certain medicines, such as blood pressure medicine or medicines that make the body lose excess  fluids (diuretics). Sometimes, hypotension can be caused by not taking medicine as directed, such as taking too much of a certain medicine. What increases the risk? Certain factors can make you more likely to develop hypotension, including:  Age. Risk increases as you get older.  Conditions that affect the heart or the central nervous system.  Taking certain medicines, such as blood pressure medicine or diuretics.  Being pregnant. What are the signs or symptoms? Symptoms of this condition may include:  Weakness.  Light-headedness.  Dizziness.  Blurred vision.  Fatigue.  Rapid heartbeat.  Fainting, in severe cases. How is this diagnosed? This condition is diagnosed based on:  Your medical history.  Your symptoms.  Your blood pressure measurement. Your health care provider will check your blood pressure when you are:  Lying down.  Sitting.  Standing. A blood pressure reading is recorded as two numbers, such as "120 over 80" (or 120/80). The first ("top") number is called the systolic pressure. It is a measure of the pressure in your arteries as your heart beats. The second ("bottom") number is called the diastolic pressure. It is a measure of the pressure in your arteries when your heart relaxes between beats. Blood pressure is measured in a unit called mm Hg. Healthy blood pressure for adults is 120/80. If your blood pressure is below 90/60, you may be diagnosed with hypotension. Other information or tests that may be used to diagnose hypotension include:  Your other vital signs, such as your heart rate and temperature.  Blood tests.  Tilt table test. For this test, you will be safely secured to a table that moves you from a lying position to  an upright position. Your heart rhythm and blood pressure will be monitored during the test. How is this treated? Treatment for this condition may include:  Changing your diet. This may involve eating more salt (sodium) or  drinking more water.  Taking medicines to raise your blood pressure.  Changing the dosage of certain medicines you are taking that might be lowering your blood pressure.  Wearing compression stockings. These stockings help to prevent blood clots and reduce swelling in your legs. In some cases, you may need to go to the hospital for:  Fluid replacement. This means you will receive fluids through an IV tube.  Blood replacement. This means you will receive donated blood through an IV tube (transfusion).  Treating an infection or heart problems, if this applies.  Monitoring. You may need to be monitored while medicines that you are taking wear off. Follow these instructions at home: Eating and drinking  Drink enough fluid to keep your urine clear or pale yellow.  Eat a healthy diet and follow instructions from your health care provider about eating or drinking restrictions. A healthy diet includes:  Fresh fruits and vegetables.  Whole grains.  Lean meats.  Low-fat dairy products.  Eat extra salt only as directed. Do not add extra salt to your diet unless your health care provider told you to do that.  Eat frequent, small meals.  Avoid standing up suddenly after eating. Medicines  Take over-the-counter and prescription medicines only as told by your health care provider.  Follow instructions from your health care provider about changing the dosage of your current medicines, if this applies.  Do not stop or adjust any of your medicines on your own. General instructions  Wear compression stockings as told by your health care provider.  Get up slowly from lying down or sitting positions. This gives your blood pressure a chance to adjust.  Avoid hot showers and excessive heat as directed by your health care provider.  Return to your normal activities as told by your health care provider. Ask your health care provider what activities are safe for you.  Do not use any  products that contain nicotine or tobacco, such as cigarettes and e-cigarettes. If you need help quitting, ask your health care provider.  Keep all follow-up visits as told by your health care provider. This is important. Contact a health care provider if:  You vomit.  You have diarrhea.  You have a fever for more than 2-3 days.  You feel more thirsty than usual.  You feel weak and tired. Get help right away if:  You have chest pain.  You have a fast or irregular heartbeat.  You develop numbness in any part of your body.  You cannot move your arms or your legs.  You have trouble speaking.  You become sweaty or feel light-headed.  You faint.  You feel short of breath.  You have trouble staying awake.  You feel confused. This information is not intended to replace advice given to you by your health care provider. Make sure you discuss any questions you have with your health care provider. Document Released: 12/17/2005 Document Revised: 07/06/2016 Document Reviewed: 06/07/2016 Elsevier Interactive Patient Education  2017 [employer].

## 2017-02-26 ENCOUNTER — Ambulatory Visit (HOSPITAL_BASED_OUTPATIENT_CLINIC_OR_DEPARTMENT_OTHER): Payer: Medicare Other

## 2017-02-26 VITALS — BP 134/66 | HR 126 | Temp 98.2°F | Resp 18

## 2017-02-26 DIAGNOSIS — C9002 Multiple myeloma in relapse: Secondary | ICD-10-CM

## 2017-02-26 DIAGNOSIS — C9000 Multiple myeloma not having achieved remission: Secondary | ICD-10-CM

## 2017-02-26 DIAGNOSIS — M84634P Pathological fracture in other disease, left radius, subsequent encounter for fracture with malunion: Secondary | ICD-10-CM | POA: Diagnosis not present

## 2017-02-26 DIAGNOSIS — Z4789 Encounter for other orthopedic aftercare: Secondary | ICD-10-CM | POA: Diagnosis not present

## 2017-02-26 MED ORDER — ZOLEDRONIC ACID 4 MG/100ML IV SOLN
4.0000 mg | Freq: Once | INTRAVENOUS | Status: AC
  Administered 2017-02-26: 4 mg via INTRAVENOUS
  Filled 2017-02-26: qty 100

## 2017-02-26 NOTE — Patient Instructions (Signed)

## 2017-03-04 ENCOUNTER — Ambulatory Visit (HOSPITAL_BASED_OUTPATIENT_CLINIC_OR_DEPARTMENT_OTHER): Payer: Medicare Other

## 2017-03-04 ENCOUNTER — Other Ambulatory Visit (HOSPITAL_BASED_OUTPATIENT_CLINIC_OR_DEPARTMENT_OTHER): Payer: Medicare Other

## 2017-03-04 ENCOUNTER — Ambulatory Visit: Payer: Medicare Other

## 2017-03-04 VITALS — BP 113/56 | HR 127 | Temp 98.1°F | Resp 18

## 2017-03-04 DIAGNOSIS — Z5112 Encounter for antineoplastic immunotherapy: Secondary | ICD-10-CM | POA: Diagnosis present

## 2017-03-04 DIAGNOSIS — C9002 Multiple myeloma in relapse: Secondary | ICD-10-CM | POA: Diagnosis present

## 2017-03-04 LAB — CBC WITH DIFFERENTIAL/PLATELET
BASO%: 0.4 % (ref 0.0–2.0)
Basophils Absolute: 0.1 10*3/uL (ref 0.0–0.1)
EOS%: 0.4 % (ref 0.0–7.0)
Eosinophils Absolute: 0.1 10*3/uL (ref 0.0–0.5)
HCT: 28.2 % — ABNORMAL LOW (ref 34.8–46.6)
HGB: 8.8 g/dL — ABNORMAL LOW (ref 11.6–15.9)
LYMPH%: 4.6 % — AB (ref 14.0–49.7)
MCH: 30 pg (ref 25.1–34.0)
MCHC: 31.3 g/dL — ABNORMAL LOW (ref 31.5–36.0)
MCV: 96.1 fL (ref 79.5–101.0)
MONO#: 2.5 10*3/uL — ABNORMAL HIGH (ref 0.1–0.9)
MONO%: 13.1 % (ref 0.0–14.0)
NEUT%: 81.5 % — ABNORMAL HIGH (ref 38.4–76.8)
NEUTROS ABS: 15.8 10*3/uL — AB (ref 1.5–6.5)
Platelets: 189 10*3/uL (ref 145–400)
RBC: 2.94 10*6/uL — AB (ref 3.70–5.45)
RDW: 18.5 % — ABNORMAL HIGH (ref 11.2–14.5)
WBC: 19.4 10*3/uL — AB (ref 3.9–10.3)
lymph#: 0.9 10*3/uL (ref 0.9–3.3)

## 2017-03-04 LAB — COMPREHENSIVE METABOLIC PANEL
ALBUMIN: 2.7 g/dL — AB (ref 3.5–5.0)
ALK PHOS: 92 U/L (ref 40–150)
ALT: 11 U/L (ref 0–55)
ANION GAP: 10 meq/L (ref 3–11)
AST: 18 U/L (ref 5–34)
BUN: 11.3 mg/dL (ref 7.0–26.0)
CALCIUM: 12.2 mg/dL — AB (ref 8.4–10.4)
CO2: 26 mEq/L (ref 22–29)
CREATININE: 1 mg/dL (ref 0.6–1.1)
Chloride: 101 mEq/L (ref 98–109)
EGFR: 71 mL/min/{1.73_m2} — ABNORMAL LOW (ref >90)
Glucose: 119 mg/dl (ref 70–140)
Potassium: 4.9 mEq/L (ref 3.5–5.1)
Sodium: 138 mEq/L (ref 136–145)
Total Bilirubin: 0.69 mg/dL (ref 0.20–1.20)
Total Protein: 7.8 g/dL (ref 6.4–8.3)

## 2017-03-04 MED ORDER — PROCHLORPERAZINE MALEATE 10 MG PO TABS
ORAL_TABLET | ORAL | Status: AC
  Filled 2017-03-04: qty 1

## 2017-03-04 MED ORDER — PROCHLORPERAZINE MALEATE 10 MG PO TABS
10.0000 mg | ORAL_TABLET | Freq: Once | ORAL | Status: AC
  Administered 2017-03-04: 10 mg via ORAL

## 2017-03-04 MED ORDER — BORTEZOMIB CHEMO SQ INJECTION 3.5 MG (2.5MG/ML)
1.3000 mg/m2 | Freq: Once | INTRAMUSCULAR | Status: AC
  Administered 2017-03-04: 2.5 mg via SUBCUTANEOUS
  Filled 2017-03-04: qty 2.5

## 2017-03-11 ENCOUNTER — Encounter: Payer: Self-pay | Admitting: Internal Medicine

## 2017-03-11 ENCOUNTER — Telehealth: Payer: Self-pay | Admitting: Internal Medicine

## 2017-03-11 ENCOUNTER — Ambulatory Visit (HOSPITAL_BASED_OUTPATIENT_CLINIC_OR_DEPARTMENT_OTHER): Payer: Medicare Other

## 2017-03-11 ENCOUNTER — Ambulatory Visit (HOSPITAL_BASED_OUTPATIENT_CLINIC_OR_DEPARTMENT_OTHER): Payer: Medicare Other | Admitting: Internal Medicine

## 2017-03-11 ENCOUNTER — Other Ambulatory Visit (HOSPITAL_BASED_OUTPATIENT_CLINIC_OR_DEPARTMENT_OTHER): Payer: Medicare Other

## 2017-03-11 VITALS — BP 107/67 | HR 135 | Temp 98.5°F | Resp 18 | Wt 172.3 lb

## 2017-03-11 DIAGNOSIS — Z5112 Encounter for antineoplastic immunotherapy: Secondary | ICD-10-CM | POA: Diagnosis present

## 2017-03-11 DIAGNOSIS — C9000 Multiple myeloma not having achieved remission: Secondary | ICD-10-CM

## 2017-03-11 DIAGNOSIS — C9002 Multiple myeloma in relapse: Secondary | ICD-10-CM | POA: Diagnosis not present

## 2017-03-11 DIAGNOSIS — D630 Anemia in neoplastic disease: Secondary | ICD-10-CM

## 2017-03-11 DIAGNOSIS — E860 Dehydration: Secondary | ICD-10-CM | POA: Diagnosis not present

## 2017-03-11 DIAGNOSIS — Z5111 Encounter for antineoplastic chemotherapy: Secondary | ICD-10-CM

## 2017-03-11 HISTORY — DX: Dehydration: E86.0

## 2017-03-11 HISTORY — DX: Hypercalcemia: E83.52

## 2017-03-11 LAB — CBC WITH DIFFERENTIAL/PLATELET
BASO%: 0 % (ref 0.0–2.0)
Basophils Absolute: 0 10*3/uL (ref 0.0–0.1)
EOS%: 0 % (ref 0.0–7.0)
Eosinophils Absolute: 0 10*3/uL (ref 0.0–0.5)
HEMATOCRIT: 28.6 % — AB (ref 34.8–46.6)
HGB: 8.8 g/dL — ABNORMAL LOW (ref 11.6–15.9)
LYMPH#: 1 10*3/uL (ref 0.9–3.3)
LYMPH%: 5 % — AB (ref 14.0–49.7)
MCH: 29.5 pg (ref 25.1–34.0)
MCHC: 30.8 g/dL — ABNORMAL LOW (ref 31.5–36.0)
MCV: 96 fL (ref 79.5–101.0)
MONO#: 3.5 10*3/uL — AB (ref 0.1–0.9)
MONO%: 17.4 % — ABNORMAL HIGH (ref 0.0–14.0)
NEUT%: 77.6 % — ABNORMAL HIGH (ref 38.4–76.8)
NEUTROS ABS: 15.6 10*3/uL — AB (ref 1.5–6.5)
PLATELETS: 154 10*3/uL (ref 145–400)
RBC: 2.98 10*6/uL — AB (ref 3.70–5.45)
RDW: 17.4 % — ABNORMAL HIGH (ref 11.2–14.5)
WBC: 20.1 10*3/uL — AB (ref 3.9–10.3)

## 2017-03-11 LAB — COMPREHENSIVE METABOLIC PANEL
ALT: 12 U/L (ref 0–55)
ANION GAP: 10 meq/L (ref 3–11)
AST: 28 U/L (ref 5–34)
Albumin: 2.7 g/dL — ABNORMAL LOW (ref 3.5–5.0)
Alkaline Phosphatase: 90 U/L (ref 40–150)
BILIRUBIN TOTAL: 1.14 mg/dL (ref 0.20–1.20)
BUN: 12.4 mg/dL (ref 7.0–26.0)
CHLORIDE: 99 meq/L (ref 98–109)
CO2: 26 meq/L (ref 22–29)
CREATININE: 0.9 mg/dL (ref 0.6–1.1)
Calcium: 11.8 mg/dL — ABNORMAL HIGH (ref 8.4–10.4)
EGFR: 73 mL/min/{1.73_m2} — ABNORMAL LOW (ref >90)
Glucose: 91 mg/dl (ref 70–140)
Potassium: 4.4 mEq/L (ref 3.5–5.1)
Sodium: 135 mEq/L — ABNORMAL LOW (ref 136–145)
TOTAL PROTEIN: 7.8 g/dL (ref 6.4–8.3)

## 2017-03-11 LAB — TECHNOLOGIST REVIEW

## 2017-03-11 MED ORDER — ZOLEDRONIC ACID 4 MG/100ML IV SOLN
4.0000 mg | Freq: Once | INTRAVENOUS | Status: AC
  Administered 2017-03-11: 4 mg via INTRAVENOUS
  Filled 2017-03-11: qty 100

## 2017-03-11 MED ORDER — PROCHLORPERAZINE MALEATE 10 MG PO TABS
ORAL_TABLET | ORAL | Status: AC
  Filled 2017-03-11: qty 1

## 2017-03-11 MED ORDER — PROCHLORPERAZINE MALEATE 10 MG PO TABS
10.0000 mg | ORAL_TABLET | Freq: Once | ORAL | Status: AC
  Administered 2017-03-11: 10 mg via ORAL

## 2017-03-11 MED ORDER — BORTEZOMIB CHEMO SQ INJECTION 3.5 MG (2.5MG/ML)
1.3000 mg/m2 | Freq: Once | INTRAMUSCULAR | Status: AC
  Administered 2017-03-11: 2.5 mg via SUBCUTANEOUS
  Filled 2017-03-11: qty 2.5

## 2017-03-11 MED ORDER — SODIUM CHLORIDE 0.9 % IV SOLN
INTRAVENOUS | Status: AC
  Administered 2017-03-11: 15:00:00 via INTRAVENOUS

## 2017-03-11 NOTE — Progress Notes (Signed)
Okay to treat pt today with velcade and heart rate of 135

## 2017-03-11 NOTE — Progress Notes (Signed)
Darlene Howard [phone]   [phone]  OFFICE PROGRESS NOTE  [name], MD [address] [place] [address]  DIAGNOSIS: Stage II a high IgA kappa multiple myeloma diagnosed in 2006.  PRIOR THERAPY: 1) status post treatment with thalidomide and Decadron followed by autologous peripheral blood stem cell transplant with high-dose melphalan on 04/26/2006 at [facility].  2) the patient had evidence for disease recurrence in August of 2008 and she was treated with 6 cycles of Velcade completed on 03/30/2008 with response to the treatment but was discontinued secondary to neuropathy.  3) maintenance treatment with Revlimid 10 mg by mouth daily by the does was later increase to 15 mg by mouth daily secondary to biochemical recurrence with stabilization of her disease. The treatment with Revlimid 15 mg by mouth daily was discontinued secondary to evidence for disease progression. 4) Revlimid 25 mg by mouth daily for 3 weeks every 4 weeks in addition to Decadron currently at 20 mg by mouth on a weekly basis. She is status post 12 cycles. This was discontinued secondary to disease progression. 5) systemic chemotherapy with Daratumumab 16 MG/KG weekly for the first 9 weeks, Velcade 1.3 MG/M2 subcutaneously on days 1, 4, 8 and 11 every 3 weeks in addition to Decadron 20 mg by mouth on days 1, 2, 4, 5, 8, 9, 11 and 12 every 3 weeks. First dose 05/07/2016. Status post one cycle discontinued secondary to intolerance.  CURRENT THERAPY: 1) systemic chemotherapy with Velcade 1.3 MG/M2 subcutaneously weekly in addition to Decadron 40 mg by mouth daily weekly status post 36 cycles. 2) Zometa 4 mg IV every 2 months. First dose 05/05/2015.  INTERVAL HISTORY: Darlene Howard 66 y.o. female came to the clinic today for follow-up visit accompanied by her son and sister-in-law. The patient is complaining of increasing fatigue and weakness as well as lack  of appetite and dehydration. She denied having any chest pain or shortness of breath. She lost several pounds since her last visit. She denied having any fever or chills. She has no nausea, vomiting, diarrhea or constipation. She has a fall at home recently. She is here today for evaluation before starting cycle #37 of her treatment.  MEDICAL HISTORY: Past Medical History:  Diagnosis Date  . Bruises easily    d/t being on COumadin  . Cholelithiasis   . DDD (degenerative disc disease), lumbar   . Fatty liver   . GERD (gastroesophageal reflux disease)    takes Pantoprazole daily  . Glaucoma    uses eye drops as instructed  . H/O stem cell transplant ([place])    04-26-2006  AT [facility]  . Herpes    takes Valtrex as needed  . History of colon polyps   . Hypertension    takes Amlodipine and Diovan daily  . Left radial fracture 01/21/2017  . Lung cancer ([place])    chest wall lesion aadn 6th rib  . Multiple myeloma DX  2006  STATE IIA,  IgA  KAPPA   STABLE  PER DR Matia Zelada--  S/P STEM CELL TRANSPLANT 2007 /  RECURRENCE 2008  CHEMO ENDED 03-30-2008/   NOW ON MAINTANANCE REVLIMID )  . 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 Personal history of colon cancer    ASCENDING COLON--  S/P  RIGHT HEMICOLECTOMY WITH NEGATIVE NODES/    NO RECURRENCE  . Type 2 diabetes mellitus ([place])   .  Vocal cord nodule    resolved- see Dr. Deeann Saint notes in media tab 06/17/14    ALLERGIES:  is allergic to codeine and tramadol.  MEDICATIONS:  Current Outpatient Prescriptions  Medication Sig Dispense Refill  . acetaminophen (TYLENOL) 500 MG tablet Take 500 mg by mouth daily as needed for moderate pain. Reported on 06/25/2016    . aspirin (ASPIRIN LOW DOSE) 81 MG EC tablet Take 81 mg by mouth daily.      . benzonatate (TESSALON) 100 MG capsule Take 1 capsule (100 mg total) by mouth 3 (three) times daily as needed for cough. 30 capsule 0  . calcium-vitamin D (OSCAL  500/200 D-3) 500-200 MG-UNIT per tablet Take 1 tablet by mouth 3 (three) times daily.      . cholecalciferol (VITAMIN D) 1000 UNITS tablet Take 1,000 Units by mouth daily. Reported on 04/24/2016    . dexamethasone (DECADRON) 4 MG tablet TAKE 10 TABS ONCE WEEKLY  WHILE UNDERGOING VELCADE  INJECTIONS. 120 tablet 0  . dorzolamide-timolol (COSOPT) 22.3-6.8 MG/ML ophthalmic solution Place 1 drop into the left eye 2 (two) times daily.     Marland Kitchen gabapentin (NEURONTIN) 100 MG capsule TAKE 1 CAPSULE BY MOUTH 3  TIMES DAILY 270 capsule 1  . loperamide (IMODIUM A-D) 2 MG tablet Take 2 mg by mouth daily as needed for diarrhea or loose stools. Reported on 07/09/2016    . loratadine (CLARITIN) 10 MG tablet Take 1 tablet (10 mg total) by mouth daily. 30 tablet 5  . LUMIGAN 0.01 % SOLN Place 1 drop into both eyes at bedtime.     . Multiple Vitamins-Minerals (CENTRUM SILVER PO) Take 1 tablet by mouth daily.     . ondansetron (ZOFRAN) 4 MG tablet Take 1 tablet (4 mg total) by mouth every 6 (six) hours. (Patient not taking: Reported on 02/11/2017) 20 tablet 0  . oxyCODONE-acetaminophen (PERCOCET/ROXICET) 5-325 MG tablet Take 1 tablet by mouth every 8 (eight) hours as needed for severe pain. 30 tablet 0  . pantoprazole (PROTONIX) 40 MG tablet TAKE 1 TABLET BY MOUTH  DAILY FOR ACID REFLUX 90 tablet 1  . potassium chloride SA (K-DUR,KLOR-CON) 20 MEQ tablet TAKE 1 TABLET BY MOUTH  TWICE A DAY 180 tablet 1  . PREMARIN vaginal cream PLACE 1 APPLICATORFUL VAGINALLY TWICE A WEEK. 30 g 0  . promethazine-dextromethorphan (PROMETHAZINE-DM) 6.25-15 MG/5ML syrup One teaspoon at bedtime as needed, for excess cough (Patient not taking: Reported on 02/11/2017) 240 mL 0  . temazepam (RESTORIL) 15 MG capsule TAKE 1 CAPSULE AT BEDTIME AS NEEDED FOR SLEEP. 30 capsule 2  . valACYclovir (VALTREX) 1000 MG tablet TAKE 1 TABLET BY MOUTH  DAILY AS NEEDED 90 tablet 0  . valsartan-hydrochlorothiazide (DIOVAN-HCT) 320-25 MG tablet Take 1 tablet by mouth   daily 90 tablet 1   No current facility-administered medications for this visit.     SURGICAL HISTORY:  Past Surgical History:  Procedure Laterality Date  . ABDOMINAL HYSTERECTOMY  1983   w/  appendectomy and left salpingoophorectom  . CATARACT EXTRACTION W/PHACO Left 01/20/2014   Procedure: CATARACT EXTRACTION PHACO AND INTRAOCULAR LENS PLACEMENT (IOC);  Surgeon: Adonis Brook, MD;  Location: [facility];  Service: Ophthalmology;  Laterality: Left;  . CERVICAL CONE BIOPSY    . CHOLECYSTECTOMY N/A 02/28/2016   Procedure: LAPAROSCOPIC CHOLECYSTECTOMY;  Surgeon: Ralene Ok, MD;  Location: [facility];  Service: General;  Laterality: N/A;  . CO2  ABLATION VULVA PERIANAL AND VAGINAL DYSPLATIC AREAS  02-15-2010  . COLONOSCOPY  10/07/2006  ZOX:WRUEAV rectum/Status post right hemicolectomy. Normal residual colon  . COLONOSCOPY  10/2009   RMR: diminutive RS polyp (hyperplastic).   . COLONOSCOPY N/A 12/15/2014   Procedure: COLONOSCOPY;  Surgeon: Daneil Dolin, MD;  [facility];  Service: Endoscopy;  Laterality: N/A;  130   . ESOPHAGOGASTRODUODENOSCOPY  04/02/2005   WUJ:[id number] polyp with central erosion versus ulceration in the body of the stomach, resected and recovered Remainder of the gastric mucosa, D1, D2 appeared normal  . HEMICOLECTOMY Right 04-27-2005   CARCINOMA   ASCENDING COLON  . LESION REMOVAL N/A 05/06/2014   Procedure: LASER OF THE VAGINA;  Surgeon: Janie Morning, MD;  Location: [facility];  Service: Gynecology;  Laterality: N/A;  . LIMBAL STEM CELL TRANSPLANT  04-26-2006    ([facility])  . VULVAR LESION REMOVAL N/A 05/06/2014   Procedure: LASER OF VULVAR LESION;  Surgeon: Janie Morning, MD;  Location: [facility];  Service: Gynecology;  Laterality: N/A;  . VULVECTOMY N/A 05/06/2014   Procedure: WIDE LOCAL EXCISION LEFT LABIA MAJORA;  Surgeon: Janie Morning, MD;  Location: [facility];  Service: Gynecology;  Laterality: N/A;      REVIEW OF SYSTEMS:  Constitutional: positive for anorexia, fatigue and weight loss Eyes: negative Ears, nose, mouth, throat, and face: negative Respiratory: negative Cardiovascular: negative Gastrointestinal: negative Genitourinary:negative Integument/breast: negative Hematologic/lymphatic: negative Musculoskeletal:positive for muscle weakness Neurological: negative Behavioral/Psych: negative Endocrine: negative Allergic/Immunologic: negative   PHYSICAL EXAMINATION: General appearance: alert, cooperative, fatigued and no distress Head: Normocephalic, without obvious abnormality, atraumatic Neck: no adenopathy, no JVD, supple, symmetrical, trachea midline and thyroid not enlarged, symmetric, no tenderness/mass/nodules Lymph nodes: Cervical, supraclavicular, and axillary nodes normal. Resp: clear to auscultation bilaterally Back: symmetric, no curvature. ROM normal. No CVA tenderness. Cardio: regular rate and rhythm, S1, S2 normal, no murmur, click, rub or gallop GI: soft, non-tender; bowel sounds normal; no masses,  no organomegaly Extremities: extremities normal, atraumatic, no cyanosis or edema Neurologic: Alert and oriented X 3, normal strength and tone. Normal symmetric reflexes. Normal coordination and gait  ECOG PERFORMANCE STATUS: 1 - Symptomatic but completely ambulatory  There were no vitals taken for this visit.  LABORATORY DATA: Lab Results  Component Value Date   WBC 20.1 (H) 03/11/2017   HGB 8.8 (L) 03/11/2017   HCT 28.6 (L) 03/11/2017   MCV 96.0 03/11/2017   PLT 154 03/11/2017      Chemistry      Component Value Date/Time   NA 135 (L) 03/11/2017 1116   K 4.4 03/11/2017 1116   CL 110 08/23/2016 1000   CL 105 06/17/2013 1441   CO2 26 03/11/2017 1116   BUN 12.4 03/11/2017 1116   CREATININE 0.9 03/11/2017 1116      Component Value Date/Time   CALCIUM 11.8 (H) 03/11/2017 1116   ALKPHOS 90 03/11/2017 1116   AST 28 03/11/2017 1116   ALT 12 03/11/2017  1116   BILITOT 1.14 03/11/2017 1116       RADIOGRAPHIC STUDIES: No results found.  ASSESSMENT AND PLAN:  This is a very pleasant 66 years old African-American female with relapsed multiple myeloma and she is currently on treatment with subcutaneous Velcade and Decadron status post 36 cycles. She has been tolerating her treatment well with no adverse effect except for fatigue and weakness recently. I sent her weakness and fatigue is probably secondary to the hypercalcemia of malignancy and anemia than her systemic chemotherapy. I recommended for the patient to proceed with her treatment today as  scheduled. I will order skeletal bone survey for further evaluation of her disease. For the hypercalcemia of malignancy, I will arrange for the patient to receive Zometa infusion today. For dehydration, I will arrange for the patient to receive 1 L of normal saline in the clinic today. For the anemia of neoplastic disease, her hemoglobin and hematocrit are low but stable. I will monitor them closely and consider the patient for transfusion if her hemoglobin is less than 8.0 g/dL. I will see her back for follow-up visit next week for evaluation and management of any adverse effect of her treatment. The patient voices understanding of current disease status and treatment options and is in agreement with the current care plan.  All questions were answered. The patient knows to call the clinic with any problems, questions or concerns. We can certainly see the patient much sooner if necessary.  Disclaimer: This note was dictated with voice recognition software. Similar sounding words can inadvertently be transcribed and may not be corrected upon review.

## 2017-03-11 NOTE — Progress Notes (Signed)
Per [name], RN per Dr. [name] okay to treat today with heart rate of 135. Patient is to receive IVF and zometa today as well.

## 2017-03-11 NOTE — Telephone Encounter (Signed)
Appointments scheduled per 3/12 LOS. Patient given AVS report and calendars with future scheduled appointments.

## 2017-03-11 NOTE — Patient Instructions (Signed)
[facility] Discharge Instructions for Patients Receiving Chemotherapy  Today you received the following chemotherapy agents Velcade. To help prevent nausea and vomiting after your treatment, we encourage you to take your nausea medication as directed.  If you develop nausea and vomiting that is not controlled by your nausea medication, call the clinic.   BELOW ARE SYMPTOMS THAT SHOULD BE REPORTED IMMEDIATELY:  *FEVER GREATER THAN 100.5 F  *CHILLS WITH OR WITHOUT FEVER  NAUSEA AND VOMITING THAT IS NOT CONTROLLED WITH YOUR NAUSEA MEDICATION  *UNUSUAL SHORTNESS OF BREATH  *UNUSUAL BRUISING OR BLEEDING  TENDERNESS IN MOUTH AND THROAT WITH OR WITHOUT PRESENCE OF ULCERS  *URINARY PROBLEMS  *BOWEL PROBLEMS  UNUSUAL RASH Items with * indicate a potential emergency and should be followed up as soon as possible.  Feel free to call the clinic you have any questions or concerns. The clinic phone number is [phone].  Please show the CHEMO ALERT CARD at check-in to the Emergency Department and triage nurse.    

## 2017-03-13 NOTE — Progress Notes (Signed)
Nutrition Assessment   Reason for Assessment:   Patient identified on Malnutrition Screening Report  ASSESSMENT:  66 year old female with multiple myeloma being treated with velcade currently.  Past medical history GERD, HTN, lung cancer, multiple myeloma.   Spoke with patient via phone this am.  Patient reports for the past month decreased appetite.  In January fell and broke arm, then in Feb had surgery and reports since then appetite has decreased.  Reports that yesterday she ate oatmeal and peanut butter crackers for breakfast, then banana and oatmeal cookie for lunch and liver, mashed potatoes and gravy and mac and cheese for dinner.   Reports no problems chewing or swallowing, no change in bowel habits.    Nutrition Focused Physical Exam: deferred  Medications: reviewed  Labs: reviewed  Anthropometrics:   Height: 66 inches Weight: 172 lb 4.8 oz BMI: 27 UBW: 186 pounds  Noted 6% weight loss in the last month, significant    Estimated Energy Needs  Kcals: 1950-2300 calories/d Protein: 94-117 g/d Fluid: 2.3 L/d  NUTRITION DIAGNOSIS: Unintentional weight loss related to poor appetite as evidenced by weight loss of 6% in the last month   MALNUTRITION DIAGNOSIS: Patient meets criteria for severe malnutrition in acute illness as evidenced by 6% weight loss in the last month and eating < or equal to 50% of energy needs for > or equal to 5 days.   INTERVENTION:   Discussed ways to increase calories and protein. Will mail fact sheet. Encouraged patient to try oral nutrition supplements with at least 350 calories or more  Contact information mailed as well    MONITORING, EVALUATION, GOAL: Patient will consume adequate calories and protein to meet nutritional needs and prevent further weight loss   NEXT VISIT: April 9th during infusion  Joice Nazario B. Zenia Resides, [place], [place] Registered Dietitian [phone] (pager)

## 2017-03-14 ENCOUNTER — Telehealth: Payer: Self-pay | Admitting: Family Medicine

## 2017-03-14 ENCOUNTER — Other Ambulatory Visit: Payer: Self-pay | Admitting: Family Medicine

## 2017-03-14 MED ORDER — BENZONATATE 100 MG PO CAPS: 100.0000 mg | ORAL_CAPSULE | Freq: Two times a day (BID) | ORAL | 0 refills | Status: DC | PRN

## 2017-03-14 MED ORDER — AZITHROMYCIN 250 MG PO TABS: ORAL_TABLET | ORAL | 0 refills | Status: DC

## 2017-03-14 NOTE — Telephone Encounter (Signed)
Darlene Howard is calling asking for something for a cough, please advise?

## 2017-03-14 NOTE — Telephone Encounter (Signed)
Deep cough x 2 days, no fever. Unable to cough up any mucus but nose is congested also. Has been using robitussin with not much relief. Please advise

## 2017-03-14 NOTE — Telephone Encounter (Signed)
z pack and tessalon perles sent to [facility], if still not well next week needs oV

## 2017-03-14 NOTE — Telephone Encounter (Signed)
Pt aware.

## 2017-03-15 ENCOUNTER — Ambulatory Visit (HOSPITAL_COMMUNITY)
Admission: RE | Admit: 2017-03-15 | Discharge: 2017-03-15 | Disposition: A | Discharge status: Home / Self Care | Payer: Medicare Other | Source: Ambulatory Visit | Attending: Internal Medicine | Admitting: Internal Medicine

## 2017-03-15 DIAGNOSIS — R918 Other nonspecific abnormal finding of lung field: Secondary | ICD-10-CM | POA: Insufficient documentation

## 2017-03-15 DIAGNOSIS — C9000 Multiple myeloma not having achieved remission: Secondary | ICD-10-CM | POA: Diagnosis not present

## 2017-03-15 DIAGNOSIS — E860 Dehydration: Secondary | ICD-10-CM | POA: Diagnosis not present

## 2017-03-15 DIAGNOSIS — Z5111 Encounter for antineoplastic chemotherapy: Secondary | ICD-10-CM | POA: Diagnosis not present

## 2017-03-18 ENCOUNTER — Other Ambulatory Visit: Payer: Medicare Other

## 2017-03-18 ENCOUNTER — Ambulatory Visit (HOSPITAL_BASED_OUTPATIENT_CLINIC_OR_DEPARTMENT_OTHER): Payer: Medicare Other | Admitting: Oncology

## 2017-03-18 ENCOUNTER — Ambulatory Visit: Payer: Medicare Other

## 2017-03-18 ENCOUNTER — Other Ambulatory Visit (HOSPITAL_BASED_OUTPATIENT_CLINIC_OR_DEPARTMENT_OTHER): Payer: Medicare Other

## 2017-03-18 ENCOUNTER — Encounter: Payer: Self-pay | Admitting: Oncology

## 2017-03-18 ENCOUNTER — Ambulatory Visit (HOSPITAL_BASED_OUTPATIENT_CLINIC_OR_DEPARTMENT_OTHER): Payer: Medicare Other

## 2017-03-18 ENCOUNTER — Ambulatory Visit (HOSPITAL_COMMUNITY)
Admission: RE | Admit: 2017-03-18 | Discharge: 2017-03-18 | Disposition: A | Discharge status: Home / Self Care | Payer: Medicare Other | Source: Ambulatory Visit | Attending: Oncology | Admitting: Oncology

## 2017-03-18 ENCOUNTER — Ambulatory Visit: Payer: Medicare Other | Admitting: Nutrition

## 2017-03-18 VITALS — BP 122/62 | HR 122 | Temp 98.3°F | Resp 17 | Ht 66.0 in | Wt 167.8 lb

## 2017-03-18 DIAGNOSIS — F329 Major depressive disorder, single episode, unspecified: Secondary | ICD-10-CM | POA: Insufficient documentation

## 2017-03-18 DIAGNOSIS — D638 Anemia in other chronic diseases classified elsewhere: Secondary | ICD-10-CM

## 2017-03-18 DIAGNOSIS — E860 Dehydration: Secondary | ICD-10-CM | POA: Diagnosis not present

## 2017-03-18 DIAGNOSIS — E46 Unspecified protein-calorie malnutrition: Secondary | ICD-10-CM | POA: Diagnosis not present

## 2017-03-18 DIAGNOSIS — F320 Major depressive disorder, single episode, mild: Secondary | ICD-10-CM

## 2017-03-18 DIAGNOSIS — Z5112 Encounter for antineoplastic immunotherapy: Secondary | ICD-10-CM

## 2017-03-18 DIAGNOSIS — F32A Depression, unspecified: Secondary | ICD-10-CM | POA: Insufficient documentation

## 2017-03-18 DIAGNOSIS — C9002 Multiple myeloma in relapse: Secondary | ICD-10-CM

## 2017-03-18 DIAGNOSIS — D649 Anemia, unspecified: Secondary | ICD-10-CM | POA: Diagnosis not present

## 2017-03-18 DIAGNOSIS — Z5111 Encounter for antineoplastic chemotherapy: Secondary | ICD-10-CM

## 2017-03-18 LAB — CBC WITH DIFFERENTIAL/PLATELET
BASO%: 0.1 % (ref 0.0–2.0)
BASOS ABS: 0 10*3/uL (ref 0.0–0.1)
EOS%: 0.3 % (ref 0.0–7.0)
Eosinophils Absolute: 0.1 10*3/uL (ref 0.0–0.5)
HCT: 27.8 % — ABNORMAL LOW (ref 34.8–46.6)
HGB: 8.4 g/dL — ABNORMAL LOW (ref 11.6–15.9)
LYMPH%: 5.4 % — AB (ref 14.0–49.7)
MCH: 28.9 pg (ref 25.1–34.0)
MCHC: 30.2 g/dL — AB (ref 31.5–36.0)
MCV: 95.5 fL (ref 79.5–101.0)
MONO#: 2.5 10*3/uL — ABNORMAL HIGH (ref 0.1–0.9)
MONO%: 16.2 % — AB (ref 0.0–14.0)
NEUT#: 12.1 10*3/uL — ABNORMAL HIGH (ref 1.5–6.5)
NEUT%: 78 % — AB (ref 38.4–76.8)
NRBC: 1 % — AB (ref 0–0)
PLATELETS: 171 10*3/uL (ref 145–400)
RBC: 2.91 10*6/uL — AB (ref 3.70–5.45)
RDW: 17.4 % — AB (ref 11.2–14.5)
WBC: 15.5 10*3/uL — ABNORMAL HIGH (ref 3.9–10.3)
lymph#: 0.8 10*3/uL — ABNORMAL LOW (ref 0.9–3.3)

## 2017-03-18 LAB — TECHNOLOGIST REVIEW

## 2017-03-18 LAB — COMPREHENSIVE METABOLIC PANEL
ALT: 11 U/L (ref 0–55)
ANION GAP: 10 meq/L (ref 3–11)
AST: 21 U/L (ref 5–34)
Albumin: 2.6 g/dL — ABNORMAL LOW (ref 3.5–5.0)
Alkaline Phosphatase: 93 U/L (ref 40–150)
BUN: 7.7 mg/dL (ref 7.0–26.0)
CHLORIDE: 106 meq/L (ref 98–109)
CO2: 24 mEq/L (ref 22–29)
Calcium: 11.4 mg/dL — ABNORMAL HIGH (ref 8.4–10.4)
Creatinine: 0.8 mg/dL (ref 0.6–1.1)
EGFR: 85 mL/min/{1.73_m2} — AB (ref >90)
GLUCOSE: 95 mg/dL (ref 70–140)
POTASSIUM: 4 meq/L (ref 3.5–5.1)
SODIUM: 139 meq/L (ref 136–145)
Total Bilirubin: 0.59 mg/dL (ref 0.20–1.20)
Total Protein: 8.1 g/dL (ref 6.4–8.3)

## 2017-03-18 LAB — PREPARE RBC (CROSSMATCH)

## 2017-03-18 MED ORDER — OXYCODONE-ACETAMINOPHEN 5-325 MG PO TABS
1.0000 | ORAL_TABLET | Freq: Three times a day (TID) | ORAL | 0 refills | Status: DC | PRN
Start: 2017-03-18 — End: 2017-04-01

## 2017-03-18 MED ORDER — MIRTAZAPINE 30 MG PO TABS: 30.0000 mg | ORAL_TABLET | Freq: Every day | ORAL | 1 refills | Status: DC

## 2017-03-18 MED ORDER — BORTEZOMIB CHEMO SQ INJECTION 3.5 MG (2.5MG/ML)
1.3000 mg/m2 | Freq: Once | INTRAMUSCULAR | Status: AC
  Administered 2017-03-18: 2.5 mg via SUBCUTANEOUS
  Filled 2017-03-18: qty 2.5

## 2017-03-18 MED ORDER — PROCHLORPERAZINE MALEATE 10 MG PO TABS
ORAL_TABLET | ORAL | Status: AC
  Filled 2017-03-18: qty 1

## 2017-03-18 MED ORDER — SODIUM CHLORIDE 0.9 % IV SOLN
INTRAVENOUS | Status: DC
  Administered 2017-03-18: 11:00:00 via INTRAVENOUS

## 2017-03-18 MED ORDER — PROCHLORPERAZINE MALEATE 10 MG PO TABS
10.0000 mg | ORAL_TABLET | Freq: Once | ORAL | Status: AC
Start: 2017-03-18 — End: 2017-03-18
  Administered 2017-03-18: 10 mg via ORAL

## 2017-03-18 NOTE — Patient Instructions (Signed)
[facility] Discharge Instructions for Patients Receiving Chemotherapy  Today you received the following chemotherapy agents: Velcade  To help prevent nausea and vomiting after your treatment, we encourage you to take your nausea medication as directed.    If you develop nausea and vomiting that is not controlled by your nausea medication, call the clinic.   BELOW ARE SYMPTOMS THAT SHOULD BE REPORTED IMMEDIATELY:  *FEVER GREATER THAN 100.5 F  *CHILLS WITH OR WITHOUT FEVER  NAUSEA AND VOMITING THAT IS NOT CONTROLLED WITH YOUR NAUSEA MEDICATION  *UNUSUAL SHORTNESS OF BREATH  *UNUSUAL BRUISING OR BLEEDING  TENDERNESS IN MOUTH AND THROAT WITH OR WITHOUT PRESENCE OF ULCERS  *URINARY PROBLEMS  *BOWEL PROBLEMS  UNUSUAL RASH Items with * indicate a potential emergency and should be followed up as soon as possible.  Feel free to call the clinic you have any questions or concerns. The clinic phone number is [phone].  Please show the [place] at check-in to the Emergency Department and triage nurse.   Dehydration, Adult Dehydration is when there is not enough fluid or water in your body. This happens when you lose more fluids than you take in. Dehydration can range from mild to very bad. It should be treated right away to keep it from getting very bad. Symptoms of mild dehydration may include:   Thirst.  Dry lips.  Slightly dry mouth.  Dry, warm skin.  Dizziness. Symptoms of moderate dehydration may include:   Very dry mouth.  Muscle cramps.  Dark pee (urine). Pee may be the color of tea.  Your body making less pee.  Your eyes making fewer tears.  Heartbeat that is uneven or faster than normal (palpitations).  Headache.  Light-headedness, especially when you stand up from sitting.  Fainting (syncope). Symptoms of very bad dehydration may include:   Changes in skin, such as:  Cold and clammy skin.  Blotchy (mottled) or pale  skin.  Skin that does not quickly return to normal after being lightly pinched and let go (poor skin turgor).  Changes in body fluids, such as:  Feeling very thirsty.  Your eyes making fewer tears.  Not sweating when body temperature is high, such as in hot weather.  Your body making very little pee.  Changes in vital signs, such as:  Weak pulse.  Pulse that is more than 100 beats a minute when you are sitting still.  Fast breathing.  Low blood pressure.  Other changes, such as:  Sunken eyes.  Cold hands and feet.  Confusion.  Lack of energy (lethargy).  Trouble waking up from sleep.  Short-term weight loss.  Unconsciousness. Follow these instructions at home:  If told by your doctor, drink an ORS:  Make an ORS by using instructions on the package.  Start by drinking small amounts, about  cup (120 mL) every 5-10 minutes.  Slowly drink more until you have had the amount that your doctor said to have.  Drink enough clear fluid to keep your pee clear or pale yellow. If you were told to drink an ORS, finish the ORS first, then start slowly drinking clear fluids. Drink fluids such as:  Water. Do not drink only water by itself. Doing that can make the salt (sodium) level in your body get too low (hyponatremia).  Ice chips.  Fruit juice that you have added water to (diluted).  Low-calorie sports drinks.  Avoid:  Alcohol.  Drinks that have a lot of sugar. These include high-calorie sports  drinks, fruit juice that does not have water added, and soda.  Caffeine.  Foods that are greasy or have a lot of fat or sugar.  Take over-the-counter and prescription medicines only as told by your doctor.  Do not take salt tablets. Doing that can make the salt level in your body get too high (hypernatremia).  Eat foods that have minerals (electrolytes). Examples include bananas, oranges, potatoes, tomatoes, and spinach.  Keep all follow-up visits as told by your  doctor. This is important. Contact a doctor if:  You have belly (abdominal) pain that:  Gets worse.  Stays in one area (localizes).  You have a rash.  You have a stiff neck.  You get angry or annoyed more easily than normal (irritability).  You are more sleepy than normal.  You have a harder time waking up than normal.  You feel:  Weak.  Dizzy.  Very thirsty.  You have peed (urinated) only a small amount of very dark pee during 6-8 hours. Get help right away if:  You have symptoms of very bad dehydration.  You cannot drink fluids without throwing up (vomiting).  Your symptoms get worse with treatment.  You have a fever.  You have a very bad headache.  You are throwing up or having watery poop (diarrhea) and it:  Gets worse.  Does not go away.  You have blood or something green (bile) in your throw-up.  You have blood in your poop (stool). This may cause poop to look black and tarry.  You have not peed in 6-8 hours.  You pass out (faint).  Your heart rate when you are sitting still is more than 100 beats a minute.  You have trouble breathing. This information is not intended to replace advice given to you by your health care provider. Make sure you discuss any questions you have with your health care provider. Document Released: 10/13/2009 Document Revised: 07/06/2016 Document Reviewed: 02/10/2016 Elsevier Interactive Patient Education  2017 [employer].

## 2017-03-18 NOTE — Progress Notes (Signed)
[place] [phone]   [phone]  OFFICE PROGRESS NOTE  [name], MD [address], [address] [place] [address]  DIAGNOSIS: Stage II a high IgA kappa multiple myeloma diagnosed in 2006.  PRIOR THERAPY: 1) status post treatment with thalidomide and Decadron followed by autologous peripheral blood stem cell transplant with high-dose melphalan on 04/26/2006 at [facility].  2) the patient had evidence for disease recurrence in August of 2008 and she was treated with 6 cycles of Velcade completed on 03/30/2008 with response to the treatment but was discontinued secondary to neuropathy.  3) maintenance treatment with Revlimid 10 mg by mouth daily by the does was later increase to 15 mg by mouth daily secondary to biochemical recurrence with stabilization of her disease. The treatment with Revlimid 15 mg by mouth daily was discontinued secondary to evidence for disease progression. 4) Revlimid 25 mg by mouth daily for 3 weeks every 4 weeks in addition to Decadron currently at 20 mg by mouth on a weekly basis. She is status post 12 cycles. This was discontinued secondary to disease progression. 5) systemic chemotherapy with Daratumumab 16 MG/KG weekly for the first 9 weeks, Velcade 1.3 MG/M2 subcutaneously on days 1, 4, 8 and 11 every 3 weeks in addition to Decadron 20 mg by mouth on days 1, 2, 4, 5, 8, 9, 11 and 12 every 3 weeks. First dose 05/07/2016. Status post one cycle discontinued secondary to intolerance.  CURRENT THERAPY: 1) systemic chemotherapy with Velcade 1.3 MG/M2 subcutaneously weekly in addition to Decadron 40 mg by mouth daily weekly status post 37 cycles. 2) Zometa 4 mg IV every 2 months. First dose 05/05/2015.  INTERVAL HISTORY: Darlene Howard 66 y.o. female came to the clinic today for follow-up visit accompanied by her brother. The patient states that she feels better this week overall, but still has fatigue and  weakness as well as lack of appetite and dehydration. Weight is down by 4 pounds since last week. She denied having any chest pain or shortness of breath. She has a cough that is nonproductive. She was placed on a Z-Pak by her primary care provider and will take the last dose today. Cough has improved. She denied having any fever or chills. She has no nausea, vomiting, diarrhea or constipation. Pain is stable and she takes her Percocet proximately twice a day. Requesting a refill on this medication today. Her brother thinks that she may be showing some signs of depression and is inquiring about starting her on medication for this. He has also inquired about support groups. She is here today for evaluation before starting cycle #38 of her treatment.  MEDICAL HISTORY: Past Medical History:  Diagnosis Date  . Bruises easily    d/t being on COumadin  . Cholelithiasis   . DDD (degenerative disc disease), lumbar   . Dehydration 03/11/2017  . Fatty liver   . GERD (gastroesophageal reflux disease)    takes Pantoprazole daily  . Glaucoma    uses eye drops as instructed  . H/O stem cell transplant ([place])    04-26-2006  AT [facility]  . Herpes    takes Valtrex as needed  . History of colon polyps   . Hypercalcemia of malignancy 03/11/2017  . Hypertension    takes Amlodipine and Diovan daily  . Left radial fracture 01/21/2017  . Lung cancer ([place])    chest wall lesion aadn 6th rib  . Multiple myeloma DX  2006  STATE IIA,  IgA  KAPPA   STABLE  PER DR MOHAMED--  S/P STEM CELL TRANSPLANT 2007 /  RECURRENCE 2008  CHEMO ENDED 03-30-2008/   NOW ON MAINTANANCE REVLIMID )  . 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 Personal history of colon cancer    ASCENDING COLON--  S/P  RIGHT HEMICOLECTOMY WITH NEGATIVE NODES/    NO RECURRENCE  . Type 2 diabetes mellitus ([place])   . Vocal cord nodule    resolved- see Dr. Deeann Saint notes in media tab 06/17/14    ALLERGIES:   is allergic to codeine and tramadol.  MEDICATIONS:  Current Outpatient Prescriptions  Medication Sig Dispense Refill  . acetaminophen (TYLENOL) 500 MG tablet Take 500 mg by mouth daily as needed for moderate pain. Reported on 06/25/2016    . aspirin (ASPIRIN LOW DOSE) 81 MG EC tablet Take 81 mg by mouth daily.      Marland Kitchen azithromycin (ZITHROMAX) 250 MG tablet Two tablets on day one, then one tablet once daily for an additional four days 6 tablet 0  . benzonatate (TESSALON) 100 MG capsule Take 1 capsule (100 mg total) by mouth 2 (two) times daily as needed for cough. 20 capsule 0  . calcium-vitamin D (OSCAL 500/200 D-3) 500-200 MG-UNIT per tablet Take 1 tablet by mouth 3 (three) times daily.      . cholecalciferol (VITAMIN D) 1000 UNITS tablet Take 1,000 Units by mouth daily. Reported on 04/24/2016    . dexamethasone (DECADRON) 4 MG tablet TAKE 10 TABS ONCE WEEKLY  WHILE UNDERGOING VELCADE  INJECTIONS. 120 tablet 0  . dorzolamide-timolol (COSOPT) 22.3-6.8 MG/ML ophthalmic solution Place 1 drop into the left eye 2 (two) times daily.     Marland Kitchen gabapentin (NEURONTIN) 100 MG capsule TAKE 1 CAPSULE BY MOUTH 3  TIMES DAILY 270 capsule 1  . loperamide (IMODIUM A-D) 2 MG tablet Take 2 mg by mouth daily as needed for diarrhea or loose stools. Reported on 07/09/2016    . loratadine (CLARITIN) 10 MG tablet Take 1 tablet (10 mg total) by mouth daily. 30 tablet 5  . LUMIGAN 0.01 % SOLN Place 1 drop into both eyes at bedtime.     . Multiple Vitamins-Minerals (CENTRUM SILVER PO) Take 1 tablet by mouth daily.     . ondansetron (ZOFRAN) 4 MG tablet Take 1 tablet (4 mg total) by mouth every 6 (six) hours. (Patient not taking: Reported on 02/11/2017) 20 tablet 0  . oxyCODONE-acetaminophen (PERCOCET/ROXICET) 5-325 MG tablet Take 1 tablet by mouth every 8 (eight) hours as needed for severe pain. 30 tablet 0  . pantoprazole (PROTONIX) 40 MG tablet TAKE 1 TABLET BY MOUTH  DAILY FOR ACID REFLUX 90 tablet 1  . potassium chloride SA  (K-DUR,KLOR-CON) 20 MEQ tablet TAKE 1 TABLET BY MOUTH  TWICE A DAY 180 tablet 1  . PREMARIN vaginal cream PLACE 1 APPLICATORFUL VAGINALLY TWICE A WEEK. 30 g 0  . promethazine-dextromethorphan (PROMETHAZINE-DM) 6.25-15 MG/5ML syrup One teaspoon at bedtime as needed, for excess cough (Patient not taking: Reported on 02/11/2017) 240 mL 0  . temazepam (RESTORIL) 15 MG capsule TAKE 1 CAPSULE AT BEDTIME AS NEEDED FOR SLEEP. (Patient not taking: Reported on 03/11/2017) 30 capsule 2  . valACYclovir (VALTREX) 1000 MG tablet TAKE 1 TABLET BY MOUTH  DAILY AS NEEDED 90 tablet 0  . valsartan-hydrochlorothiazide (DIOVAN-HCT) 320-25 MG tablet Take 1 tablet by mouth  daily 90 tablet 1  No current facility-administered medications for this visit.     SURGICAL HISTORY:  Past Surgical History:  Procedure Laterality Date  . ABDOMINAL HYSTERECTOMY  1983   w/  appendectomy and left salpingoophorectom  . CATARACT EXTRACTION W/PHACO Left 01/20/2014   Procedure: CATARACT EXTRACTION PHACO AND INTRAOCULAR LENS PLACEMENT (IOC);  Surgeon: Adonis Brook, MD;  Location: [facility];  Service: Ophthalmology;  Laterality: Left;  . CERVICAL CONE BIOPSY    . CHOLECYSTECTOMY N/A 02/28/2016   Procedure: LAPAROSCOPIC CHOLECYSTECTOMY;  Surgeon: Ralene Ok, MD;  Location: [facility];  Service: General;  Laterality: N/A;  . CO2  ABLATION VULVA PERIANAL AND VAGINAL DYSPLATIC AREAS  02-15-2010  . COLONOSCOPY  10/07/2006   NUU:VOZDGU rectum/Status post right hemicolectomy. Normal residual colon  . COLONOSCOPY  10/2009   RMR: diminutive RS polyp (hyperplastic).   . COLONOSCOPY N/A 12/15/2014   Procedure: COLONOSCOPY;  Surgeon: Daneil Dolin, MD;  [facility];  Service: Endoscopy;  Laterality: N/A;  130   . ESOPHAGOGASTRODUODENOSCOPY  04/02/2005   YQI:[id number]/[id number] polyp with central erosion versus ulceration in the body of the stomach, resected and recovered Remainder of the gastric mucosa, D1, D2 appeared normal  .  HEMICOLECTOMY Right 04-27-2005   CARCINOMA   ASCENDING COLON  . LESION REMOVAL N/A 05/06/2014   Procedure: LASER OF THE VAGINA;  Surgeon: Janie Morning, MD;  Location: [facility];  Service: Gynecology;  Laterality: N/A;  . LIMBAL STEM CELL TRANSPLANT  04-26-2006    ([facility])  . VULVAR LESION REMOVAL N/A 05/06/2014   Procedure: LASER OF VULVAR LESION;  Surgeon: Janie Morning, MD;  Location: [facility];  Service: Gynecology;  Laterality: N/A;  . VULVECTOMY N/A 05/06/2014   Procedure: WIDE LOCAL EXCISION LEFT LABIA MAJORA;  Surgeon: Janie Morning, MD;  Location: [facility];  Service: Gynecology;  Laterality: N/A;    REVIEW OF SYSTEMS:  Constitutional: positive for anorexia, fatigue and weight loss Eyes: negative Ears, nose, mouth, throat, and face: negative Respiratory: negative Cardiovascular: negative Gastrointestinal: negative Genitourinary:negative Integument/breast: negative Hematologic/lymphatic: negative Musculoskeletal:positive for muscle weakness Neurological: negative Behavioral/Psych: negative Endocrine: negative Allergic/Immunologic: negative   PHYSICAL EXAMINATION: General appearance: alert, cooperative, fatigued and no distress Head: Normocephalic, without obvious abnormality, atraumatic Neck: no adenopathy, no JVD, supple, symmetrical, trachea midline and thyroid not enlarged, symmetric, no tenderness/mass/nodules Lymph nodes: Cervical, supraclavicular, and axillary nodes normal. Resp: clear to auscultation bilaterally Back: symmetric, no curvature. ROM normal. No CVA tenderness. Cardio: regular rate and rhythm, S1, S2 normal, no murmur, click, rub or gallop GI: soft, non-tender; bowel sounds normal; no masses,  no organomegaly Extremities: extremities normal, atraumatic, no cyanosis or edema Neurologic: Alert and oriented X 3, normal strength and tone. Normal symmetric reflexes. Normal coordination and gait  ECOG PERFORMANCE  STATUS: 1 - Symptomatic but completely ambulatory  Blood pressure 122/62, pulse (!) 122, temperature 98.3 F (36.8 C), temperature source Oral, resp. rate 17, height _0  (1.676 m), weight 167 lb 12.8 oz (76.1 kg), SpO2 100 %.  LABORATORY DATA: Lab Results  Component Value Date   WBC 15.5 (H) 03/18/2017   HGB 8.4 (L) 03/18/2017   HCT 27.8 (L) 03/18/2017   MCV 95.5 03/18/2017   PLT 171 03/18/2017      Chemistry      Component Value Date/Time   NA 135 (L) 03/11/2017 1116   K 4.4 03/11/2017 1116   CL 110 08/23/2016 1000   CL 105 06/17/2013 1441   CO2 26 03/11/2017 1116  BUN 12.4 03/11/2017 1116   CREATININE 0.9 03/11/2017 1116      Component Value Date/Time   CALCIUM 11.8 (H) 03/11/2017 1116   ALKPHOS 90 03/11/2017 1116   AST 28 03/11/2017 1116   ALT 12 03/11/2017 1116   BILITOT 1.14 03/11/2017 1116       RADIOGRAPHIC STUDIES: Dg Bone Survey Met  Result Date: 03/15/2017 CLINICAL DATA:  History of multiple myeloma. EXAM: METASTATIC BONE SURVEY COMPARISON:  05/01/2016. FINDINGS: Imaging of the axial and appendicular spine obtained. Severe increase in multiple lytic lesions noted throughout the skull. Small lytic lesions are most likely present throughout the spine. Lytic lesions are noted throughout the bony structures of both lower extremities and upper extremities. ORIF left radius. Callus formation is noted. Right chest density is noted. This may represent previously identified right rib mass/ plasmocytoma. Right upper lobe infiltrate and/or mass lesion cannot be excluded. PA lateral chest x-ray suggested for further evaluation . IMPRESSION: 1. Progressive widespread changes of multiple myeloma noted throughout the axial and appendicular skeleton. 2. Right chest density noted. This may represent previously identified right rib mass/ plasmocytoma. Right upper lobe infiltrate and/or mass lesion cannot be excluded. PA and lateral chest x-ray suggested to further evaluation  Electronically Signed   By: Marcello Moores  Register   On: 03/15/2017 08:45    ASSESSMENT AND PLAN:  This is a very pleasant 66 year old African-American female with relapsed multiple myeloma and she is currently on treatment with subcutaneous Velcade and Decadron status post 37 cycles. She has been tolerating her treatment well with no adverse effect except for fatigue and weakness recently. Her weakness and fatigue is probably secondary to the hypercalcemia of malignancy and anemia than her systemic chemotherapy.  Skeletal bone survey was reviewed with the patient and her brother today by Dr. [name]. While the bone survey does show some worsening of her lesions, he discussed that this bone survey was compared to one that was done close to a year ago and we do know that she did have worsening of her disease during that time.. Myeloma panel has been overall stable. Recommend the patient continue on her current course of therapy with the Velcade weekly along with dexamethasone weekly. For the hypercalcemia of malignancy and dehydration, she will receive a liter of normal saline in the office today. The patient last received her Zometa 03/11/2017. For the anemia of neoplastic disease, her hemoglobin and hematocrit have dropped further. We will transfuse 2 units of packed red blood cells this week. Orders have been entered. I have refilled her oxycodone for her pain due to the myeloma. For her depression and lack of appetite, will start her on Remeron 30 mg at bedtime. I have made a referral to social work to please reach out to the patient with information on support groups. The patient will be seen by our dietitian in the infusion area today to discuss supplements and her weight loss.  Continue weekly lab and chemotherapy. She will have a visit in 3 weeks for evaluation and management of any adverse effect of her treatment. The patient voices understanding of current disease status and treatment options and is  in agreement with the current care plan.  All questions were answered. The patient knows to call the clinic with any problems, questions or concerns. We can certainly see the patient much sooner if necessary.  Mikey Bussing, DNP, AGPCNP-BC, AOCNP   ADDENDUM: Hematology/Oncology Attending: I had a face to face encounter with the patient today. I  recommended her care plan. This is a very pleasant 66 years old African-American female with relapsed multiple myeloma and she is currently on treatment with subcutaneous weekly Velcade as well as Decadron. She has been tolerating the treatment well with no concerning complaints. The recent skeletal bone survey showed evidence for progressive disease but this was compared to previous skeletal bone survey in May 2017. I recommended for the patient to continue her current treatment with subcutaneous Velcade and Decadron. She would also continue Zometa for the lytic bone disease and hypercalcemia. For the anemia of neoplastic disease, we will arrange for the patient to receive 2 units of PRBCs transfusion later this week. For pain management she will given a refill for oxycodone today. For depression and lack of appetite, will start the patient on Remeron 30 mg by mouth daily at bedtime. The patient would come back for follow-up visit in 3 weeks for evaluation and management of any adverse effect of her treatment. She was advised to call immediately if she has any concerning symptoms in the interval.  Disclaimer: This note was dictated with voice recognition software. Similar sounding words can inadvertently be transcribed and may be missed upon review. Eilleen Kempf., MD 03/18/17

## 2017-03-18 NOTE — Progress Notes (Signed)
Per Dixie RN ok to treat with HR 122 today.

## 2017-03-18 NOTE — Progress Notes (Signed)
Nutrition follow-up completed with patient in the infusion room. Patient is receiving treatment for multiple myeloma. Patient's weight has decreased and was documented as 167.8 pounds March 19 decreased from 172.3 pounds March 12. Patient consuming very small amounts of food and is only eating 3-4 times a day. She has not been consuming oral nutrition supplements.  Nutrition diagnosis: Unintentional weight loss continues.  Severe malnutrition continues.  Intervention:  Educated patient to increase small frequent meals and snacks 6 times daily with high-calorie, high-protein foods. Recommended patient add ensure loss or boost plus one to 2 bottles daily. Provided samples. Questions were answered.  Teach back method used.  Monitoring, evaluation, goals:  Patient will tolerate increased calories and protein to minimize further weight loss.  Next visit: Monday, April 9, during infusion.  **Disclaimer: This note was dictated with voice recognition software. Similar sounding words can inadvertently be transcribed and this note may contain transcription errors which may not have been corrected upon publication of note.**

## 2017-03-19 ENCOUNTER — Other Ambulatory Visit: Payer: Self-pay | Admitting: Family Medicine

## 2017-03-19 ENCOUNTER — Ambulatory Visit: Payer: Medicare Other | Admitting: Gynecologic Oncology

## 2017-03-21 ENCOUNTER — Other Ambulatory Visit: Payer: Self-pay | Admitting: Medical Oncology

## 2017-03-21 ENCOUNTER — Ambulatory Visit (HOSPITAL_BASED_OUTPATIENT_CLINIC_OR_DEPARTMENT_OTHER): Payer: Medicare Other

## 2017-03-21 DIAGNOSIS — D649 Anemia, unspecified: Secondary | ICD-10-CM

## 2017-03-21 DIAGNOSIS — C9002 Multiple myeloma in relapse: Secondary | ICD-10-CM

## 2017-03-21 MED ORDER — DIPHENHYDRAMINE HCL 25 MG PO CAPS
ORAL_CAPSULE | ORAL | Status: AC
  Filled 2017-03-21: qty 1

## 2017-03-21 MED ORDER — SODIUM CHLORIDE 0.9% FLUSH
3.0000 mL | INTRAVENOUS | Status: DC | PRN
  Filled 2017-03-21: qty 10

## 2017-03-21 MED ORDER — DIPHENHYDRAMINE HCL 25 MG PO CAPS
25.0000 mg | ORAL_CAPSULE | Freq: Once | ORAL | Status: AC
  Administered 2017-03-21: 25 mg via ORAL

## 2017-03-21 MED ORDER — ACETAMINOPHEN 325 MG PO TABS
650.0000 mg | ORAL_TABLET | Freq: Once | ORAL | Status: AC
  Administered 2017-03-21: 650 mg via ORAL

## 2017-03-21 MED ORDER — ACETAMINOPHEN 325 MG PO TABS
ORAL_TABLET | ORAL | Status: AC
  Filled 2017-03-21: qty 2

## 2017-03-21 MED ORDER — SODIUM CHLORIDE 0.9 % IV SOLN
250.0000 mL | Freq: Once | INTRAVENOUS | Status: AC
  Administered 2017-03-21: 250 mL via INTRAVENOUS

## 2017-03-21 NOTE — Patient Instructions (Signed)
Blood Transfusion , Adult A blood transfusion is a procedure in which you receive donated blood, including plasma, platelets, and red blood cells, through an IV tube. You may need a blood transfusion because of illness, surgery, or injury. The blood may come from a donor. You may also be able to donate blood for yourself (autologous blood donation) before a surgery if you know that you might require a blood transfusion. The blood given in a transfusion is made up of different types of cells. You may receive:  Red blood cells. These carry oxygen to the cells in the body.  White blood cells. These help you fight infections.  Platelets. These help your blood to clot.  Plasma. This is the liquid part of your blood and it helps with fluid imbalances. If you have hemophilia or another clotting disorder, you may also receive other types of blood products. Tell a health care provider about:  Any allergies you have.  All medicines you are taking, including vitamins, herbs, eye drops, creams, and over-the-counter medicines.  Any problems you or family members have had with anesthetic medicines.  Any blood disorders you have.  Any surgeries you have had.  Any medical conditions you have, including any recent fever or cold symptoms.  Whether you are pregnant or may be pregnant.  Any previous reactions you have had during a blood transfusion. What are the risks? Generally, this is a safe procedure. However, problems may occur, including:  Having an allergic reaction to something in the donated blood. Hives and itching may be symptoms of this type of reaction.  Fever. This may be a reaction to the white blood cells in the transfused blood. Nausea or chest pain may accompany a fever.  Iron overload. This can happen from having many transfusions.  Transfusion-related acute lung injury (TRALI). This is a rare reaction that causes lung damage. The cause is not known.TRALI can occur within hours  of a transfusion or several days later.  Sudden (acute) or delayed hemolytic reactions. This happens if your blood does not match the cells in your transfusion. Your body's defense system (immune system) may try to attack the new cells. This complication is rare. The symptoms include fever, chills, nausea, and low back pain or chest pain.  Infection or disease transmission. This is rare. What happens before the procedure?  You will have a blood test to determine your blood type. This is necessary to know what kind of blood your body will accept and to match it to the donor blood.  If you are going to have a planned surgery, you may be able to do an autologous blood donation. This may be done in case you need to have a transfusion.  If you have had an allergic reaction to a transfusion in the past, you may be given medicine to help prevent a reaction. This medicine may be given to you by mouth or through an IV tube.  You will have your temperature, blood pressure, and pulse monitored before the transfusion.  Follow instructions from your health care provider about eating and drinking restrictions.  Ask your health care provider about:  Changing or stopping your regular medicines. This is especially important if you are taking diabetes medicines or blood thinners.  Taking medicines such as aspirin and ibuprofen. These medicines can thin your blood. Do not take these medicines before your procedure if your health care provider instructs you not to. What happens during the procedure?  An IV tube will be   inserted into one of your veins.  The bag of donated blood will be attached to your IV tube. The blood will then enter through your vein.  Your temperature, blood pressure, and pulse will be monitored regularly during the transfusion. This monitoring is done to detect early signs of a transfusion reaction.  If you have any signs or symptoms of a reaction, your transfusion will be stopped and  you may be given medicine.  When the transfusion is complete, your IV tube will be removed.  Pressure may be applied to the IV site for a few minutes.  A bandage (dressing) will be applied. The procedure may vary among health care providers and hospitals. What happens after the procedure?  Your temperature, blood pressure, heart rate, breathing rate, and blood oxygen level will be monitored often.  Your blood may be tested to see how you are responding to the transfusion.  You may be warmed with fluids or blankets to maintain a normal body temperature. Summary  A blood transfusion is a procedure in which you receive donated blood, including plasma, platelets, and red blood cells, through an IV tube.  Your temperature, blood pressure, and pulse will be monitored before, during, and after the transfusion.  Your blood may be tested after the transfusion to see how your body has responded. This information is not intended to replace advice given to you by your health care provider. Make sure you discuss any questions you have with your health care provider. Document Released: 12/14/2000 Document Revised: 09/13/2016 Document Reviewed: 09/13/2016 Elsevier Interactive Patient Education  2017 [employer]  

## 2017-03-22 LAB — BPAM RBC
BLOOD PRODUCT EXPIRATION DATE: 201804122359
Blood Product Expiration Date: 201804122359
ISSUE DATE / TIME: 201803221135
ISSUE DATE / TIME: 201803221135
UNIT TYPE AND RH: 6200
UNIT TYPE AND RH: 6200

## 2017-03-22 LAB — TYPE AND SCREEN
ABO/RH(D): A POS
ANTIBODY SCREEN: NEGATIVE
UNIT DIVISION: 0
Unit division: 0

## 2017-03-25 ENCOUNTER — Ambulatory Visit (HOSPITAL_BASED_OUTPATIENT_CLINIC_OR_DEPARTMENT_OTHER): Payer: Medicare Other

## 2017-03-25 ENCOUNTER — Other Ambulatory Visit (HOSPITAL_BASED_OUTPATIENT_CLINIC_OR_DEPARTMENT_OTHER): Payer: Medicare Other

## 2017-03-25 VITALS — BP 103/69 | HR 110 | Temp 98.2°F | Resp 18

## 2017-03-25 DIAGNOSIS — C9002 Multiple myeloma in relapse: Secondary | ICD-10-CM

## 2017-03-25 DIAGNOSIS — Z5112 Encounter for antineoplastic immunotherapy: Secondary | ICD-10-CM | POA: Diagnosis present

## 2017-03-25 LAB — CBC WITH DIFFERENTIAL/PLATELET
BASO%: 0.4 % (ref 0.0–2.0)
Basophils Absolute: 0.1 10*3/uL (ref 0.0–0.1)
EOS%: 0.2 % (ref 0.0–7.0)
Eosinophils Absolute: 0 10*3/uL (ref 0.0–0.5)
HCT: 33.2 % — ABNORMAL LOW (ref 34.8–46.6)
HGB: 10.3 g/dL — ABNORMAL LOW (ref 11.6–15.9)
LYMPH%: 3.7 % — AB (ref 14.0–49.7)
MCH: 29.3 pg (ref 25.1–34.0)
MCHC: 31.2 g/dL — AB (ref 31.5–36.0)
MCV: 93.9 fL (ref 79.5–101.0)
MONO#: 2.6 10*3/uL — AB (ref 0.1–0.9)
MONO%: 14 % (ref 0.0–14.0)
NEUT%: 81.7 % — AB (ref 38.4–76.8)
NEUTROS ABS: 15.3 10*3/uL — AB (ref 1.5–6.5)
PLATELETS: 122 10*3/uL — AB (ref 145–400)
RBC: 3.53 10*6/uL — AB (ref 3.70–5.45)
RDW: 18.7 % — ABNORMAL HIGH (ref 11.2–14.5)
WBC: 18.7 10*3/uL — AB (ref 3.9–10.3)
lymph#: 0.7 10*3/uL — ABNORMAL LOW (ref 0.9–3.3)

## 2017-03-25 LAB — COMPREHENSIVE METABOLIC PANEL
ALT: 15 U/L (ref 0–55)
AST: 29 U/L (ref 5–34)
Albumin: 2.5 g/dL — ABNORMAL LOW (ref 3.5–5.0)
Alkaline Phosphatase: 90 U/L (ref 40–150)
Anion Gap: 11 mEq/L (ref 3–11)
BUN: 10.4 mg/dL (ref 7.0–26.0)
CALCIUM: 12.1 mg/dL — AB (ref 8.4–10.4)
CHLORIDE: 103 meq/L (ref 98–109)
CO2: 23 mEq/L (ref 22–29)
CREATININE: 1 mg/dL (ref 0.6–1.1)
EGFR: 65 mL/min/{1.73_m2} — ABNORMAL LOW (ref >90)
GLUCOSE: 130 mg/dL (ref 70–140)
Potassium: 4.4 mEq/L (ref 3.5–5.1)
SODIUM: 138 meq/L (ref 136–145)
Total Bilirubin: 0.69 mg/dL (ref 0.20–1.20)
Total Protein: 8 g/dL (ref 6.4–8.3)

## 2017-03-25 LAB — TECHNOLOGIST REVIEW

## 2017-03-25 MED ORDER — BORTEZOMIB CHEMO SQ INJECTION 3.5 MG (2.5MG/ML)
1.3000 mg/m2 | Freq: Once | INTRAMUSCULAR | Status: AC
  Administered 2017-03-25: 2.5 mg via SUBCUTANEOUS
  Filled 2017-03-25: qty 2.5

## 2017-03-25 MED ORDER — PROCHLORPERAZINE MALEATE 10 MG PO TABS
10.0000 mg | ORAL_TABLET | Freq: Once | ORAL | Status: AC
  Administered 2017-03-25: 10 mg via ORAL

## 2017-03-25 MED ORDER — PROCHLORPERAZINE MALEATE 10 MG PO TABS
ORAL_TABLET | ORAL | Status: AC
  Filled 2017-03-25: qty 1

## 2017-03-25 NOTE — Patient Instructions (Signed)
[facility] Discharge Instructions for Patients Receiving Chemotherapy  Today you received the following chemotherapy agents Velcade. To help prevent nausea and vomiting after your treatment, we encourage you to take your nausea medication as directed.  If you develop nausea and vomiting that is not controlled by your nausea medication, call the clinic.   BELOW ARE SYMPTOMS THAT SHOULD BE REPORTED IMMEDIATELY:  *FEVER GREATER THAN 100.5 F  *CHILLS WITH OR WITHOUT FEVER  NAUSEA AND VOMITING THAT IS NOT CONTROLLED WITH YOUR NAUSEA MEDICATION  *UNUSUAL SHORTNESS OF BREATH  *UNUSUAL BRUISING OR BLEEDING  TENDERNESS IN MOUTH AND THROAT WITH OR WITHOUT PRESENCE OF ULCERS  *URINARY PROBLEMS  *BOWEL PROBLEMS  UNUSUAL RASH Items with * indicate a potential emergency and should be followed up as soon as possible.  Feel free to call the clinic you have any questions or concerns. The clinic phone number is [phone].  Please show the CHEMO ALERT CARD at check-in to the Emergency Department and triage nurse.    

## 2017-03-26 DIAGNOSIS — C9000 Multiple myeloma not having achieved remission: Secondary | ICD-10-CM | POA: Diagnosis not present

## 2017-03-26 DIAGNOSIS — Z4789 Encounter for other orthopedic aftercare: Secondary | ICD-10-CM | POA: Diagnosis not present

## 2017-04-01 ENCOUNTER — Ambulatory Visit (HOSPITAL_BASED_OUTPATIENT_CLINIC_OR_DEPARTMENT_OTHER): Payer: Medicare Other

## 2017-04-01 ENCOUNTER — Other Ambulatory Visit: Payer: Self-pay | Admitting: Medical Oncology

## 2017-04-01 ENCOUNTER — Telehealth: Payer: Self-pay | Admitting: Internal Medicine

## 2017-04-01 ENCOUNTER — Telehealth: Payer: Self-pay | Admitting: Family Medicine

## 2017-04-01 ENCOUNTER — Other Ambulatory Visit (HOSPITAL_BASED_OUTPATIENT_CLINIC_OR_DEPARTMENT_OTHER): Payer: Medicare Other

## 2017-04-01 VITALS — BP 106/58 | HR 118 | Temp 98.2°F | Resp 20

## 2017-04-01 DIAGNOSIS — C9002 Multiple myeloma in relapse: Secondary | ICD-10-CM

## 2017-04-01 DIAGNOSIS — Z5112 Encounter for antineoplastic immunotherapy: Secondary | ICD-10-CM | POA: Diagnosis present

## 2017-04-01 LAB — CBC WITH DIFFERENTIAL/PLATELET
BASO%: 0.1 % (ref 0.0–2.0)
Basophils Absolute: 0 10*3/uL (ref 0.0–0.1)
EOS ABS: 0 10*3/uL (ref 0.0–0.5)
EOS%: 0.2 % (ref 0.0–7.0)
HCT: 32.5 % — ABNORMAL LOW (ref 34.8–46.6)
HEMOGLOBIN: 9.9 g/dL — AB (ref 11.6–15.9)
LYMPH%: 9.4 % — ABNORMAL LOW (ref 14.0–49.7)
MCH: 29.4 pg (ref 25.1–34.0)
MCHC: 30.5 g/dL — ABNORMAL LOW (ref 31.5–36.0)
MCV: 96.4 fL (ref 79.5–101.0)
MONO#: 2.1 10*3/uL — AB (ref 0.1–0.9)
MONO%: 12.6 % (ref 0.0–14.0)
NEUT%: 77.7 % — ABNORMAL HIGH (ref 38.4–76.8)
NEUTROS ABS: 12.9 10*3/uL — AB (ref 1.5–6.5)
Platelets: 155 10*3/uL (ref 145–400)
RBC: 3.37 10*6/uL — ABNORMAL LOW (ref 3.70–5.45)
RDW: 17.4 % — AB (ref 11.2–14.5)
WBC: 16.7 10*3/uL — AB (ref 3.9–10.3)
lymph#: 1.6 10*3/uL (ref 0.9–3.3)

## 2017-04-01 LAB — COMPREHENSIVE METABOLIC PANEL
ALBUMIN: 2.5 g/dL — AB (ref 3.5–5.0)
ALK PHOS: 74 U/L (ref 40–150)
ALT: 10 U/L (ref 0–55)
AST: 24 U/L (ref 5–34)
Anion Gap: 12 mEq/L — ABNORMAL HIGH (ref 3–11)
BUN: 22.7 mg/dL (ref 7.0–26.0)
CO2: 21 meq/L — AB (ref 22–29)
Calcium: 13.9 mg/dL (ref 8.4–10.4)
Chloride: 106 mEq/L (ref 98–109)
Creatinine: 1.8 mg/dL — ABNORMAL HIGH (ref 0.6–1.1)
EGFR: 32 mL/min/{1.73_m2} — ABNORMAL LOW (ref >90)
Glucose: 114 mg/dl (ref 70–140)
POTASSIUM: 4.6 meq/L (ref 3.5–5.1)
SODIUM: 138 meq/L (ref 136–145)
TOTAL PROTEIN: 8.1 g/dL (ref 6.4–8.3)
Total Bilirubin: 0.48 mg/dL (ref 0.20–1.20)

## 2017-04-01 LAB — TECHNOLOGIST REVIEW

## 2017-04-01 MED ORDER — PROCHLORPERAZINE MALEATE 10 MG PO TABS
ORAL_TABLET | ORAL | Status: AC
  Filled 2017-04-01: qty 1

## 2017-04-01 MED ORDER — PROCHLORPERAZINE MALEATE 10 MG PO TABS
10.0000 mg | ORAL_TABLET | Freq: Once | ORAL | Status: AC
  Administered 2017-04-01: 10 mg via ORAL

## 2017-04-01 MED ORDER — OXYCODONE-ACETAMINOPHEN 5-325 MG PO TABS
1.0000 | ORAL_TABLET | ORAL | 0 refills | Status: DC | PRN
Start: 2017-04-01 — End: 2017-04-17

## 2017-04-01 MED ORDER — ZOLEDRONIC ACID 4 MG/100ML IV SOLN
4.0000 mg | Freq: Once | INTRAVENOUS | Status: AC
  Administered 2017-04-01: 4 mg via INTRAVENOUS
  Filled 2017-04-01: qty 100

## 2017-04-01 MED ORDER — SODIUM CHLORIDE 0.9 % IV SOLN
Freq: Once | INTRAVENOUS | Status: AC
  Administered 2017-04-01: 13:00:00 via INTRAVENOUS

## 2017-04-01 MED ORDER — BORTEZOMIB CHEMO SQ INJECTION 3.5 MG (2.5MG/ML)
1.3000 mg/m2 | Freq: Once | INTRAMUSCULAR | Status: AC
  Administered 2017-04-01: 2.5 mg via SUBCUTANEOUS
  Filled 2017-04-01: qty 2.5

## 2017-04-01 NOTE — Telephone Encounter (Signed)
Appointments complete per 4/2 schedule message. Patient was given schedule while still in infusion.

## 2017-04-01 NOTE — Patient Instructions (Signed)
Dehydration, Adult Dehydration is when there is not enough fluid or water in your body. This happens when you lose more fluids than you take in. Dehydration can range from mild to very bad. It should be treated right away to keep it from getting very bad. Symptoms of mild dehydration may include:   Thirst.  Dry lips.  Slightly dry mouth.  Dry, warm skin.  Dizziness. Symptoms of moderate dehydration may include:   Very dry mouth.  Muscle cramps.  Dark pee (urine). Pee may be the color of tea.  Your body making less pee.  Your eyes making fewer tears.  Heartbeat that is uneven or faster than normal (palpitations).  Headache.  Light-headedness, especially when you stand up from sitting.  Fainting (syncope). Symptoms of very bad dehydration may include:   Changes in skin, such as:  Cold and clammy skin.  Blotchy (mottled) or pale skin.  Skin that does not quickly return to normal after being lightly pinched and let go (poor skin turgor).  Changes in body fluids, such as:  Feeling very thirsty.  Your eyes making fewer tears.  Not sweating when body temperature is high, such as in hot weather.  Your body making very little pee.  Changes in vital signs, such as:  Weak pulse.  Pulse that is more than 100 beats a minute when you are sitting still.  Fast breathing.  Low blood pressure.  Other changes, such as:  Sunken eyes.  Cold hands and feet.  Confusion.  Lack of energy (lethargy).  Trouble waking up from sleep.  Short-term weight loss.  Unconsciousness. Follow these instructions at home:  If told by your doctor, drink an ORS:  Make an ORS by using instructions on the package.  Start by drinking small amounts, about  cup (120 mL) every 5-10 minutes.  Slowly drink more until you have had the amount that your doctor said to have.  Drink enough clear fluid to keep your pee clear or pale yellow. If you were told to drink an ORS, finish the  ORS first, then start slowly drinking clear fluids. Drink fluids such as:  Water. Do not drink only water by itself. Doing that can make the salt (sodium) level in your body get too low (hyponatremia).  Ice chips.  Fruit juice that you have added water to (diluted).  Low-calorie sports drinks.  Avoid:  Alcohol.  Drinks that have a lot of sugar. These include high-calorie sports drinks, fruit juice that does not have water added, and soda.  Caffeine.  Foods that are greasy or have a lot of fat or sugar.  Take over-the-counter and prescription medicines only as told by your doctor.  Do not take salt tablets. Doing that can make the salt level in your body get too high (hypernatremia).  Eat foods that have minerals (electrolytes). Examples include bananas, oranges, potatoes, tomatoes, and spinach.  Keep all follow-up visits as told by your doctor. This is important. Contact a doctor if:  You have belly (abdominal) pain that:  Gets worse.  Stays in one area (localizes).  You have a rash.  You have a stiff neck.  You get angry or annoyed more easily than normal (irritability).  You are more sleepy than normal.  You have a harder time waking up than normal.  You feel:  Weak.  Dizzy.  Very thirsty.  You have peed (urinated) only a small amount of very dark pee during 6-8 hours. Get help right away if:  You   have symptoms of very bad dehydration.  You cannot drink fluids without throwing up (vomiting).  Your symptoms get worse with treatment.  You have a fever.  You have a very bad headache.  You are throwing up or having watery poop (diarrhea) and it:  Gets worse.  Does not go away.  You have blood or something green (bile) in your throw-up.  You have blood in your poop (stool). This may cause poop to look black and tarry.  You have not peed in 6-8 hours.  You pass out (faint).  Your heart rate when you are sitting still is more than 100 beats a  minute.  You have trouble breathing. This information is not intended to replace advice given to you by your health care provider. Make sure you discuss any questions you have with your health care provider. Document Released: 10/13/2009 Document Revised: 07/06/2016 Document Reviewed: 02/10/2016 Elsevier Interactive Patient Education  2017 Elsevier Inc.  

## 2017-04-01 NOTE — Progress Notes (Signed)
CBC and CMET reviewed with MD, ok to treat with Creatine 1.8.

## 2017-04-01 NOTE — Telephone Encounter (Signed)
Ezzard Flax (sister in law) calling on behalf of patient. She accompanied patient to her visit at [facility] today. She stated that Dr. Earlie Server would like for Dr. Moshe Cipro to look over patient's medication (especially the blood pressure medication) and contact Shernita so that she can tell you about the visit today .  Best contact number this afternoon would be [phone].

## 2017-04-02 ENCOUNTER — Other Ambulatory Visit: Payer: Self-pay | Admitting: Family Medicine

## 2017-04-02 DIAGNOSIS — C189 Malignant neoplasm of colon, unspecified: Secondary | ICD-10-CM | POA: Diagnosis not present

## 2017-04-02 DIAGNOSIS — I10 Essential (primary) hypertension: Secondary | ICD-10-CM | POA: Diagnosis not present

## 2017-04-02 DIAGNOSIS — Z9484 Stem cells transplant status: Secondary | ICD-10-CM | POA: Diagnosis not present

## 2017-04-02 DIAGNOSIS — E669 Obesity, unspecified: Secondary | ICD-10-CM | POA: Diagnosis not present

## 2017-04-02 DIAGNOSIS — C9000 Multiple myeloma not having achieved remission: Secondary | ICD-10-CM | POA: Diagnosis not present

## 2017-04-02 DIAGNOSIS — H409 Unspecified glaucoma: Secondary | ICD-10-CM | POA: Diagnosis not present

## 2017-04-02 DIAGNOSIS — C9001 Multiple myeloma in remission: Secondary | ICD-10-CM | POA: Diagnosis not present

## 2017-04-02 NOTE — Telephone Encounter (Signed)
Spoke with Darlene Howard is to be brought in for me to check blood pressure on Wed afternoon or Thursday, to make a decision as far as BP meds. She is to be seen essentially as soon as  She gets here, as she is very ill and her oncologist has asked for my help with her bP

## 2017-04-02 NOTE — Telephone Encounter (Signed)
Left message for Darlene Howard to page me this pm, on review of blood pressure , meds will need to be adjusted down

## 2017-04-03 ENCOUNTER — Inpatient Hospital Stay (HOSPITAL_COMMUNITY)
Admission: EM | Admit: 2017-04-03 | Discharge: 2017-04-07 | DRG: 871 | Disposition: A | Discharge status: Home Health | Payer: Medicare Other | Attending: Internal Medicine | Admitting: Internal Medicine

## 2017-04-03 ENCOUNTER — Emergency Department (HOSPITAL_COMMUNITY): Payer: Medicare Other

## 2017-04-03 ENCOUNTER — Ambulatory Visit: Payer: Medicare Other | Admitting: Nurse Practitioner

## 2017-04-03 ENCOUNTER — Encounter (HOSPITAL_COMMUNITY): Payer: Self-pay | Admitting: *Deleted

## 2017-04-03 ENCOUNTER — Other Ambulatory Visit: Payer: Self-pay

## 2017-04-03 DIAGNOSIS — Z7982 Long term (current) use of aspirin: Secondary | ICD-10-CM

## 2017-04-03 DIAGNOSIS — T451X5A Adverse effect of antineoplastic and immunosuppressive drugs, initial encounter: Secondary | ICD-10-CM | POA: Diagnosis not present

## 2017-04-03 DIAGNOSIS — R404 Transient alteration of awareness: Secondary | ICD-10-CM | POA: Diagnosis not present

## 2017-04-03 DIAGNOSIS — J181 Lobar pneumonia, unspecified organism: Secondary | ICD-10-CM | POA: Diagnosis not present

## 2017-04-03 DIAGNOSIS — Z85118 Personal history of other malignant neoplasm of bronchus and lung: Secondary | ICD-10-CM

## 2017-04-03 DIAGNOSIS — M549 Dorsalgia, unspecified: Secondary | ICD-10-CM | POA: Diagnosis present

## 2017-04-03 DIAGNOSIS — Z841 Family history of disorders of kidney and ureter: Secondary | ICD-10-CM

## 2017-04-03 DIAGNOSIS — K219 Gastro-esophageal reflux disease without esophagitis: Secondary | ICD-10-CM | POA: Diagnosis present

## 2017-04-03 DIAGNOSIS — C9000 Multiple myeloma not having achieved remission: Secondary | ICD-10-CM | POA: Diagnosis present

## 2017-04-03 DIAGNOSIS — M5489 Other dorsalgia: Secondary | ICD-10-CM | POA: Diagnosis not present

## 2017-04-03 DIAGNOSIS — G8929 Other chronic pain: Secondary | ICD-10-CM | POA: Diagnosis present

## 2017-04-03 DIAGNOSIS — Z9049 Acquired absence of other specified parts of digestive tract: Secondary | ICD-10-CM | POA: Diagnosis not present

## 2017-04-03 DIAGNOSIS — E872 Acidosis: Secondary | ICD-10-CM | POA: Diagnosis present

## 2017-04-03 DIAGNOSIS — A419 Sepsis, unspecified organism: Secondary | ICD-10-CM | POA: Diagnosis not present

## 2017-04-03 DIAGNOSIS — Y95 Nosocomial condition: Secondary | ICD-10-CM | POA: Diagnosis present

## 2017-04-03 DIAGNOSIS — Z85038 Personal history of other malignant neoplasm of large intestine: Secondary | ICD-10-CM

## 2017-04-03 DIAGNOSIS — Z86010 Personal history of colonic polyps: Secondary | ICD-10-CM

## 2017-04-03 DIAGNOSIS — Z885 Allergy status to narcotic agent status: Secondary | ICD-10-CM

## 2017-04-03 DIAGNOSIS — G63 Polyneuropathy in diseases classified elsewhere: Secondary | ICD-10-CM | POA: Diagnosis present

## 2017-04-03 DIAGNOSIS — E46 Unspecified protein-calorie malnutrition: Secondary | ICD-10-CM | POA: Diagnosis not present

## 2017-04-03 DIAGNOSIS — Z79899 Other long term (current) drug therapy: Secondary | ICD-10-CM

## 2017-04-03 DIAGNOSIS — M545 Low back pain, unspecified: Secondary | ICD-10-CM | POA: Diagnosis present

## 2017-04-03 DIAGNOSIS — E119 Type 2 diabetes mellitus without complications: Secondary | ICD-10-CM | POA: Diagnosis present

## 2017-04-03 DIAGNOSIS — M5416 Radiculopathy, lumbar region: Secondary | ICD-10-CM | POA: Diagnosis present

## 2017-04-03 DIAGNOSIS — Z66 Do not resuscitate: Secondary | ICD-10-CM | POA: Diagnosis present

## 2017-04-03 DIAGNOSIS — G620 Drug-induced polyneuropathy: Secondary | ICD-10-CM | POA: Diagnosis present

## 2017-04-03 DIAGNOSIS — J189 Pneumonia, unspecified organism: Secondary | ICD-10-CM | POA: Diagnosis not present

## 2017-04-03 DIAGNOSIS — K760 Fatty (change of) liver, not elsewhere classified: Secondary | ICD-10-CM

## 2017-04-03 DIAGNOSIS — Z90710 Acquired absence of both cervix and uterus: Secondary | ICD-10-CM | POA: Diagnosis not present

## 2017-04-03 DIAGNOSIS — H409 Unspecified glaucoma: Secondary | ICD-10-CM | POA: Diagnosis present

## 2017-04-03 DIAGNOSIS — I10 Essential (primary) hypertension: Secondary | ICD-10-CM | POA: Diagnosis present

## 2017-04-03 DIAGNOSIS — Z7952 Long term (current) use of systemic steroids: Secondary | ICD-10-CM

## 2017-04-03 DIAGNOSIS — R000 Tachycardia, unspecified: Secondary | ICD-10-CM

## 2017-04-03 DIAGNOSIS — M5410 Radiculopathy, site unspecified: Secondary | ICD-10-CM | POA: Diagnosis not present

## 2017-04-03 DIAGNOSIS — Z8249 Family history of ischemic heart disease and other diseases of the circulatory system: Secondary | ICD-10-CM

## 2017-04-03 DIAGNOSIS — Z9484 Stem cells transplant status: Secondary | ICD-10-CM | POA: Diagnosis not present

## 2017-04-03 DIAGNOSIS — R531 Weakness: Secondary | ICD-10-CM | POA: Diagnosis not present

## 2017-04-03 DIAGNOSIS — Z823 Family history of stroke: Secondary | ICD-10-CM

## 2017-04-03 DIAGNOSIS — R05 Cough: Secondary | ICD-10-CM | POA: Diagnosis not present

## 2017-04-03 LAB — URINALYSIS, ROUTINE W REFLEX MICROSCOPIC
BILIRUBIN URINE: NEGATIVE
GLUCOSE, UA: NEGATIVE mg/dL
KETONES UR: NEGATIVE mg/dL
LEUKOCYTES UA: NEGATIVE
NITRITE: NEGATIVE
PROTEIN: NEGATIVE mg/dL
Specific Gravity, Urine: 1.017 (ref 1.005–1.030)
pH: 5 (ref 5.0–8.0)

## 2017-04-03 LAB — LACTIC ACID, PLASMA
LACTIC ACID, VENOUS: 2 mmol/L — AB (ref 0.5–1.9)
LACTIC ACID, VENOUS: 1.9 mmol/L (ref 0.5–1.9)

## 2017-04-03 LAB — BASIC METABOLIC PANEL
ANION GAP: 10 (ref 5–15)
BUN: 22 mg/dL — ABNORMAL HIGH (ref 6–20)
CALCIUM: 12 mg/dL — AB (ref 8.9–10.3)
CO2: 24 mmol/L (ref 22–32)
CREATININE: 1.02 mg/dL — AB (ref 0.44–1.00)
Chloride: 104 mmol/L (ref 101–111)
GFR calc non Af Amer: 56 mL/min — ABNORMAL LOW (ref >60)
Glucose, Bld: 73 mg/dL (ref 65–99)
Potassium: 4.4 mmol/L (ref 3.5–5.1)
SODIUM: 138 mmol/L (ref 135–145)

## 2017-04-03 LAB — CBG MONITORING, ED: GLUCOSE-CAPILLARY: 63 mg/dL — AB (ref 65–99)

## 2017-04-03 LAB — INFLUENZA PANEL BY PCR (TYPE A & B)
Influenza A By PCR: NEGATIVE
Influenza B By PCR: NEGATIVE

## 2017-04-03 LAB — CBC
HCT: 29.9 % — ABNORMAL LOW (ref 36.0–46.0)
HCT: 27.7 % — ABNORMAL LOW (ref 36.0–46.0)
HEMOGLOBIN: 8.7 g/dL — AB (ref 12.0–15.0)
Hemoglobin: 9.5 g/dL — ABNORMAL LOW (ref 12.0–15.0)
MCH: 30 pg (ref 26.0–34.0)
MCH: 29.5 pg (ref 26.0–34.0)
MCHC: 31.8 g/dL (ref 30.0–36.0)
MCHC: 31.4 g/dL (ref 30.0–36.0)
MCV: 94.3 fL (ref 78.0–100.0)
MCV: 93.9 fL (ref 78.0–100.0)
PLATELETS: 148 10*3/uL — AB (ref 150–400)
Platelets: 116 10*3/uL — ABNORMAL LOW (ref 150–400)
RBC: 3.17 MIL/uL — AB (ref 3.87–5.11)
RBC: 2.95 MIL/uL — AB (ref 3.87–5.11)
RDW: 17.3 % — ABNORMAL HIGH (ref 11.5–15.5)
RDW: 17.4 % — ABNORMAL HIGH (ref 11.5–15.5)
WBC: 22.3 10*3/uL — AB (ref 4.0–10.5)
WBC: 19.4 10*3/uL — AB (ref 4.0–10.5)

## 2017-04-03 LAB — PROTIME-INR
INR: 1.15
Prothrombin Time: 14.7 seconds (ref 11.4–15.2)

## 2017-04-03 LAB — HEPATIC FUNCTION PANEL
ALT: 15 U/L (ref 14–54)
AST: 47 U/L — AB (ref 15–41)
Albumin: 2.9 g/dL — ABNORMAL LOW (ref 3.5–5.0)
Alkaline Phosphatase: 72 U/L (ref 38–126)
BILIRUBIN DIRECT: 0.2 mg/dL (ref 0.1–0.5)
BILIRUBIN INDIRECT: 0.5 mg/dL (ref 0.3–0.9)
BILIRUBIN TOTAL: 0.7 mg/dL (ref 0.3–1.2)
Total Protein: 8.3 g/dL — ABNORMAL HIGH (ref 6.5–8.1)

## 2017-04-03 LAB — STREP PNEUMONIAE URINARY ANTIGEN: Strep Pneumo Urinary Antigen: NEGATIVE

## 2017-04-03 LAB — CREATININE, SERUM
CREATININE: 0.72 mg/dL (ref 0.44–1.00)
GFR calc non Af Amer: >60 mL/min (ref >60)

## 2017-04-03 MED ORDER — ACETAMINOPHEN 325 MG PO TABS: 650.0000 mg | ORAL_TABLET | Freq: Four times a day (QID) | ORAL | Status: DC | PRN

## 2017-04-03 MED ORDER — LORATADINE 10 MG PO TABS
10.0000 mg | ORAL_TABLET | Freq: Every day | ORAL | Status: DC
  Administered 2017-04-03 – 2017-04-07 (×5): 10 mg via ORAL
  Filled 2017-04-03 (×5): qty 1

## 2017-04-03 MED ORDER — ASPIRIN EC 81 MG PO TBEC
81.0000 mg | DELAYED_RELEASE_TABLET | Freq: Every evening | ORAL | Status: DC
  Administered 2017-04-03 – 2017-04-06 (×4): 81 mg via ORAL
  Filled 2017-04-03 (×4): qty 1

## 2017-04-03 MED ORDER — VANCOMYCIN HCL IN DEXTROSE 1-5 GM/200ML-% IV SOLN
1000.0000 mg | Freq: Once | INTRAVENOUS | Status: AC
Start: 2017-04-03 — End: 2017-04-03
  Administered 2017-04-03: 1000 mg via INTRAVENOUS
  Filled 2017-04-03: qty 200

## 2017-04-03 MED ORDER — PANTOPRAZOLE SODIUM 40 MG PO TBEC
40.0000 mg | DELAYED_RELEASE_TABLET | Freq: Every day | ORAL | Status: DC
  Administered 2017-04-03 – 2017-04-07 (×5): 40 mg via ORAL
  Filled 2017-04-03 (×5): qty 1

## 2017-04-03 MED ORDER — ENSURE ENLIVE PO LIQD
237.0000 mL | Freq: Two times a day (BID) | ORAL | Status: DC
  Administered 2017-04-06: 237 mL via ORAL

## 2017-04-03 MED ORDER — OXYCODONE-ACETAMINOPHEN 5-325 MG PO TABS
1.0000 | ORAL_TABLET | ORAL | Status: DC | PRN
  Administered 2017-04-03 – 2017-04-07 (×13): 1 via ORAL
  Filled 2017-04-03 (×13): qty 1

## 2017-04-03 MED ORDER — LATANOPROST 0.005 % OP SOLN
1.0000 [drp] | Freq: Every day | OPHTHALMIC | Status: DC
  Administered 2017-04-03 – 2017-04-06 (×4): 1 [drp] via OPHTHALMIC
  Filled 2017-04-03: qty 2.5

## 2017-04-03 MED ORDER — DEXTROSE 5 % IV SOLN
1.0000 g | Freq: Three times a day (TID) | INTRAVENOUS | Status: DC
  Administered 2017-04-03 – 2017-04-06 (×8): 1 g via INTRAVENOUS
  Filled 2017-04-03 (×10): qty 1

## 2017-04-03 MED ORDER — ONDANSETRON HCL 4 MG PO TABS
4.0000 mg | ORAL_TABLET | Freq: Four times a day (QID) | ORAL | Status: DC | PRN
  Administered 2017-04-06: 4 mg via ORAL
  Filled 2017-04-03: qty 1

## 2017-04-03 MED ORDER — SODIUM CHLORIDE 0.9 % IV BOLUS (SEPSIS)
1000.0000 mL | Freq: Once | INTRAVENOUS | Status: AC
  Administered 2017-04-03: 1000 mL via INTRAVENOUS

## 2017-04-03 MED ORDER — CALCITONIN (SALMON) 200 UNIT/ML IJ SOLN
4.0000 [IU]/kg | Freq: Once | INTRAMUSCULAR | Status: AC
  Administered 2017-04-03: 308 [IU] via SUBCUTANEOUS
  Filled 2017-04-03: qty 1.54

## 2017-04-03 MED ORDER — ENOXAPARIN SODIUM 40 MG/0.4ML ~~LOC~~ SOLN
40.0000 mg | SUBCUTANEOUS | Status: DC
  Administered 2017-04-03 – 2017-04-06 (×4): 40 mg via SUBCUTANEOUS
  Filled 2017-04-03 (×4): qty 0.4

## 2017-04-03 MED ORDER — DORZOLAMIDE HCL-TIMOLOL MAL 2-0.5 % OP SOLN
1.0000 [drp] | Freq: Two times a day (BID) | OPHTHALMIC | Status: DC
  Administered 2017-04-03 – 2017-04-07 (×8): 1 [drp] via OPHTHALMIC
  Filled 2017-04-03: qty 10

## 2017-04-03 MED ORDER — MIRTAZAPINE 15 MG PO TABS
30.0000 mg | ORAL_TABLET | Freq: Every day | ORAL | Status: DC
  Administered 2017-04-03 – 2017-04-06 (×4): 30 mg via ORAL
  Filled 2017-04-03 (×4): qty 2

## 2017-04-03 MED ORDER — ACETAMINOPHEN 500 MG PO TABS: 500.0000 mg | ORAL_TABLET | Freq: Four times a day (QID) | ORAL | Status: DC | PRN

## 2017-04-03 MED ORDER — GABAPENTIN 100 MG PO CAPS
100.0000 mg | ORAL_CAPSULE | Freq: Three times a day (TID) | ORAL | Status: DC
  Administered 2017-04-03 – 2017-04-07 (×12): 100 mg via ORAL
  Filled 2017-04-03 (×12): qty 1

## 2017-04-03 MED ORDER — VANCOMYCIN HCL IN DEXTROSE 1-5 GM/200ML-% IV SOLN
1000.0000 mg | Freq: Two times a day (BID) | INTRAVENOUS | Status: DC
  Administered 2017-04-03 – 2017-04-05 (×5): 1000 mg via INTRAVENOUS
  Filled 2017-04-03 (×5): qty 200

## 2017-04-03 MED ORDER — POTASSIUM CHLORIDE CRYS ER 20 MEQ PO TBCR
20.0000 meq | EXTENDED_RELEASE_TABLET | Freq: Two times a day (BID) | ORAL | Status: DC
  Administered 2017-04-03 – 2017-04-07 (×8): 20 meq via ORAL
  Filled 2017-04-03 (×9): qty 1

## 2017-04-03 MED ORDER — SODIUM CHLORIDE 0.9 % IV BOLUS (SEPSIS)
500.0000 mL | Freq: Once | INTRAVENOUS | Status: AC
  Administered 2017-04-03: 500 mL via INTRAVENOUS

## 2017-04-03 MED ORDER — SODIUM CHLORIDE 0.9 % IV SOLN
INTRAVENOUS | Status: DC
  Administered 2017-04-03 – 2017-04-05 (×4): via INTRAVENOUS

## 2017-04-03 MED ORDER — CALCIUM CARBONATE-VITAMIN D 500-200 MG-UNIT PO TABS
1.0000 | ORAL_TABLET | Freq: Three times a day (TID) | ORAL | Status: DC
  Administered 2017-04-03 (×2): 1 via ORAL
  Filled 2017-04-03 (×5): qty 1

## 2017-04-03 MED ORDER — VITAMIN D3 25 MCG (1000 UNIT) PO TABS
1000.0000 [IU] | ORAL_TABLET | Freq: Every day | ORAL | Status: DC
  Administered 2017-04-03 – 2017-04-07 (×5): 1000 [IU] via ORAL
  Filled 2017-04-03 (×5): qty 1

## 2017-04-03 MED ORDER — TEMAZEPAM 15 MG PO CAPS: 15.0000 mg | ORAL_CAPSULE | Freq: Every evening | ORAL | Status: DC | PRN

## 2017-04-03 MED ORDER — FENTANYL CITRATE (PF) 100 MCG/2ML IJ SOLN
50.0000 ug | Freq: Once | INTRAMUSCULAR | Status: AC
  Administered 2017-04-03: 50 ug via INTRAVENOUS
  Filled 2017-04-03: qty 2

## 2017-04-03 MED ORDER — ADULT MULTIVITAMIN W/MINERALS CH
1.0000 | ORAL_TABLET | Freq: Every day | ORAL | Status: DC
  Administered 2017-04-03 – 2017-04-07 (×5): 1 via ORAL
  Filled 2017-04-03 (×5): qty 1

## 2017-04-03 MED ORDER — LOPERAMIDE HCL 2 MG PO CAPS
2.0000 mg | ORAL_CAPSULE | ORAL | Status: DC | PRN
  Administered 2017-04-06: 2 mg via ORAL
  Filled 2017-04-03: qty 1

## 2017-04-03 MED ORDER — ONDANSETRON HCL 4 MG/2ML IJ SOLN: 4.0000 mg | Freq: Four times a day (QID) | INTRAMUSCULAR | Status: DC | PRN

## 2017-04-03 MED ORDER — ACETAMINOPHEN 650 MG RE SUPP: 650.0000 mg | Freq: Four times a day (QID) | RECTAL | Status: DC | PRN

## 2017-04-03 MED ORDER — DEXTROSE 5 % IV SOLN
2.0000 g | Freq: Once | INTRAVENOUS | Status: AC
  Administered 2017-04-03: 2 g via INTRAVENOUS
  Filled 2017-04-03: qty 2

## 2017-04-03 NOTE — Progress Notes (Signed)
Pharmacy Antibiotic Note  Darlene Howard is a 66 y.o. female admitted on 04/03/2017 with HCAP.  Pharmacy has been consulted for vancomycin and zosyn dosing. WBC elevated at 22.3, Creat 1.8>>1.02, T 99.2. HR elevated. CXR: new RUL PNA extending to hilar region. Rec f/u CXR 3-4 weeks after abx to exclude underlying malignancy.   Plan: Vancomycin 1000 mg IV q12h for vancomycin trough goal 15-20 mcg/ml for HCAP Cefepime 2 gm x1 then 1 gm IV q8h f/u renal fxn, wbc, temp, culture data Vancomycin trough as needed  Height: '5\' 6"'$  (167.6 cm) Weight: 170 lb (77.1 kg) IBW/kg (Calculated) : 59.3  Temp (24hrs), Avg:99.2 F (37.3 C), Min:99.2 F (37.3 C), Max:99.2 F (37.3 C)   Recent Labs Lab 04/01/17 1050 04/01/17 1050 04/03/17 0708  WBC 16.7*  --  22.3*  CREATININE  --  1.8* 1.02*    Estimated Creatinine Clearance: 56.1 mL/min (A) (by C-G formula based on SCr of 1.02 mg/dL (H)).    Allergies  Allergen Reactions  . Codeine Nausea And Vomiting  . Tramadol Nausea And Vomiting   Thank you for allowing pharmacy to be a part of this patient's care.  Eudelia Bunch, Pharm.D. [phone] 04/03/2017 10:31 AM

## 2017-04-03 NOTE — ED Notes (Signed)
Bed: VG68 Expected date:  Expected time:  Means of arrival:  Comments: EMS 66 yo female weakness and shaking/back pain 151/52 HR 129-131

## 2017-04-03 NOTE — H&P (Addendum)
History and Physical    Darlene Howard ZOX:[id number] DOB: 1951-10-20 DOA: 04/03/2017    PCP: [name], MD  Patient coming from: home  Chief Complaint: weakness  HPI: Darlene Howard is a 66 y.o. 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 Brother at bedside helps give history.   She has been weak for about 2-3 wks now and has been using a walker at home and wheelchair outside. She was scheduled to receive a fluid bolus in the cancer center today but was not able to walk today and was brought in to the ER. Work up reveals a RUL pneumonia. She has had a cough for 3-4 wks now and was given a Z pak over 2 wks ago by her PCP but cough has not resolved. She has mild congestion. No fever or chills. No chest pain. No dyspnea at rest or with exertion.  She has a mild frontal headache today, feels like sinus issues. No runny nose, watery eyes, sore throat, PND or ear ache.   ED Course: HR in 130s, temp 99.2, pulse ox in 90s on RA WBC 22.3 LA 2.0 CXR >> RUL infltrate Influenza negative  Review of Systems:  Having lower back pain. Appetite has been poor and has had about 20-30 lb wt loss in 6 wks.  Had had 2 episodes of loose stools yesterday.  All other systems reviewed and apart from HPI, are negative.  Past Medical History:  Diagnosis Date  . Bruises easily    d/t being on COumadin  . Cholelithiasis   . DDD (degenerative disc disease), lumbar   . Dehydration 03/11/2017  . Fatty liver   . GERD (gastroesophageal reflux disease)    takes Pantoprazole daily  . Glaucoma    uses eye drops as instructed  . H/O stem cell transplant ([place])    04-26-2006  AT [facility]  . Herpes    takes Valtrex as needed  . History of colon polyps   . Hypercalcemia of malignancy 03/11/2017    . Hypertension    takes Amlodipine and Diovan daily  . Left radial fracture 01/21/2017  . Lung cancer ([place])    chest wall lesion aadn 6th rib  . Multiple myeloma DX  2006  STATE IIA,  IgA  KAPPA   STABLE  PER DR MOHAMED--  S/P STEM CELL TRANSPLANT 2007 /  RECURRENCE 2008  CHEMO ENDED 03-30-2008/   NOW ON MAINTANANCE REVLIMID )  . 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 Personal history of colon cancer    ASCENDING COLON--  S/P  RIGHT HEMICOLECTOMY WITH NEGATIVE NODES/    NO RECURRENCE  . Type 2 diabetes mellitus ([place])   . Vocal cord nodule    resolved- see Dr. Deeann Saint notes in media tab 06/17/14    Past Surgical History:  Procedure Laterality Date  . ABDOMINAL HYSTERECTOMY  1983   w/  appendectomy and left salpingoophorectom  . CATARACT EXTRACTION W/PHACO Left 01/20/2014   Procedure: CATARACT EXTRACTION PHACO AND INTRAOCULAR LENS PLACEMENT (IOC);  Surgeon: Adonis Brook, MD;  Location: [place];  Service: Ophthalmology;  Laterality: Left;  . CERVICAL CONE BIOPSY    . CHOLECYSTECTOMY N/A 02/28/2016   Procedure: LAPAROSCOPIC CHOLECYSTECTOMY;  Surgeon: Ralene Ok, MD;  Location:  [facility];  Service: General;  Laterality: N/A;  . CO2  ABLATION VULVA PERIANAL AND VAGINAL DYSPLATIC AREAS  02-15-2010  . COLONOSCOPY  10/07/2006   [facility]:WEXHBZ rectum/Status post right hemicolectomy. Normal residual colon  . COLONOSCOPY  10/2009   RMR: diminutive RS polyp (hyperplastic).   . COLONOSCOPY N/A 12/15/2014   Procedure: COLONOSCOPY;  Surgeon: Daneil Dolin, MD;  [facility];  Service: Endoscopy;  Laterality: N/A;  130   . ESOPHAGOGASTRODUODENOSCOPY  04/02/2005   JIR:CVELFY/BOFBPZWCHENI polyp with central erosion versus ulceration in the body of the stomach, resected and recovered Remainder of the gastric mucosa, D1, D2 appeared normal  . HEMICOLECTOMY Right 04-27-2005   CARCINOMA   ASCENDING COLON  . LESION REMOVAL N/A 05/06/2014    Procedure: LASER OF THE VAGINA;  Surgeon: Janie Morning, MD;  Location: [facility];  Service: Gynecology;  Laterality: N/A;  . LIMBAL STEM CELL TRANSPLANT  04-26-2006    ([facility])  . VULVAR LESION REMOVAL N/A 05/06/2014   Procedure: LASER OF VULVAR LESION;  Surgeon: Janie Morning, MD;  Location: [facility];  Service: Gynecology;  Laterality: N/A;  . VULVECTOMY N/A 05/06/2014   Procedure: WIDE LOCAL EXCISION LEFT LABIA MAJORA;  Surgeon: Janie Morning, MD;  Location: [place];  Service: Gynecology;  Laterality: N/A;    Social History:   reports that she has never smoked. She has never used smokeless tobacco. She reports that she does not drink alcohol or use drugs.  Lives with 2 sisters. Uses walker & wheelchair.   Allergies  Allergen Reactions  . Codeine Nausea And Vomiting  . Tramadol Nausea And Vomiting    Family History  Problem Relation Age of Onset  . Kidney failure Mother   . Hypertension Mother   . Prostate cancer Father 71  . Hypertension Father     vascular disease   . Stroke Father   . Hypertension Sister   . Hyperlipidemia Sister   . Hypertension Brother   . Hyperlipidemia Brother   . Hypertension Sister   . Hypertension Sister   . Colon cancer Neg Hx   . Breast cancer Neg Hx      Prior to Admission medications   Medication Sig Start Date End Date Taking? Authorizing Provider  acetaminophen (TYLENOL) 500 MG tablet Take 500 mg by mouth every 6 (six) hours as needed for mild pain, moderate pain, fever or headache.    Yes Historical Provider, MD  aspirin EC 81 MG tablet Take 81 mg by mouth every evening.   Yes Historical Provider, MD  calcium-vitamin D (OSCAL 500/200 D-3) 500-200 MG-UNIT per tablet Take 1 tablet by mouth 3 (three) times daily.     Yes Historical Provider, MD  cholecalciferol (VITAMIN D) 1000 UNITS tablet Take 1,000 Units by mouth daily.    Yes Historical Provider, MD  dexamethasone (DECADRON) 4 MG tablet  TAKE 10 TABS ONCE WEEKLY  WHILE UNDERGOING VELCADE  INJECTIONS. Patient taking differently: Take 10 tablets by mouth every Monday while undergoing Velcade injections 01/29/17  Yes Curt Bears, MD  dorzolamide-timolol (COSOPT) 22.3-6.8 MG/ML ophthalmic solution Place 1 drop into the left eye 2 (two) times daily.    Yes Historical Provider, MD  gabapentin (NEURONTIN) 100 MG capsule TAKE 1 CAPSULE BY MOUTH 3  TIMES DAILY 12/12/16  Yes Fayrene Helper, MD  loperamide (IMODIUM) 2 MG capsule Take 2-4 mg by mouth every 2 (two) hours as needed for diarrhea or loose stools.   Yes Historical  Provider, MD  loratadine (CLARITIN) 10 MG tablet TAKE ONE TABLET BY MOUTH ONCE DAILY. 04/02/17  Yes Fayrene Helper, MD  LUMIGAN 0.01 % SOLN Place 1 drop into both eyes at bedtime.    Yes Historical Provider, MD  mirtazapine (REMERON) 30 MG tablet Take 1 tablet (30 mg total) by mouth at bedtime. 03/18/17  Yes Maryanna Shape, NP  Multiple Vitamin (MULTIVITAMIN WITH MINERALS) TABS tablet Take 1 tablet by mouth daily.   Yes Historical Provider, MD  oxyCODONE-acetaminophen (PERCOCET/ROXICET) 5-325 MG tablet Take 1 tablet by mouth every 4 (four) hours as needed for severe pain. May take one tablet every 4-6 hours as needed for pain 04/01/17  Yes Curt Bears, MD  pantoprazole (PROTONIX) 40 MG tablet TAKE 1 TABLET BY MOUTH  DAILY FOR ACID REFLUX 10/18/16  Yes Fayrene Helper, MD  potassium chloride SA (K-DUR,KLOR-CON) 20 MEQ tablet TAKE 1 TABLET BY MOUTH  TWICE A DAY 10/18/16  Yes Fayrene Helper, MD  PREMARIN vaginal cream PLACE 1 APPLICATORFUL VAGINALLY TWICE A WEEK. 06/29/16  Yes Melissa D Cross, NP  temazepam (RESTORIL) 15 MG capsule TAKE 1 CAPSULE AT BEDTIME AS NEEDED FOR SLEEP. 08/23/16  Yes Fayrene Helper, MD  valACYclovir (VALTREX) 1000 MG tablet TAKE 1 TABLET BY MOUTH  DAILY AS NEEDED Patient taking differently: Take 1 tablet by mouth daily as needed for cold sores 03/19/17  Yes Fayrene Helper, MD    valsartan-hydrochlorothiazide (DIOVAN-HCT) 320-25 MG tablet Take 1 tablet by mouth  daily Patient taking differently: Take 1 tablet by mouth daily.  08/28/16  Yes Fayrene Helper, MD    Physical Exam: Vitals:   04/03/17 1336 04/03/17 1400 04/03/17 1417 04/03/17 1430  BP: 134/74 131/73 131/73 127/75  Pulse: (!) 120 (!) 118 (!) 118 (!) 118  Resp: (!) 29 (!) 25 (!) 26 20  Temp:      TempSrc:      SpO2: 95% 95% 96% 96%  Weight:      Height:          Constitutional: NAD, calm, comfortable Eyes: PERTLA, lids and conjunctivae normal ENMT: Mucous membranes are moist. Posterior pharynx clear of any exudate or lesions. Normal dentition.  Neck: normal, supple, no masses, no thyromegaly Respiratory: clear to auscultation bilaterally, no wheezing, no crackles. Normal respiratory effort. No accessory muscle use.  Cardiovascular: S1 & S2 heard, regular rate and rhythm, no murmurs / rubs / gallops. No extremity edema. 2+ pedal pulses. No carotid bruits.  Abdomen: No distension, no tenderness, no masses palpated. No hepatosplenomegaly. Bowel sounds normal.  Musculoskeletal: no clubbing / cyanosis. No joint deformity upper and lower extremities. Good ROM, no contractures. Normal muscle tone.  Skin: no rashes, lesions, ulcers. No induration Neurologic: CN 2-12 grossly intact. Sensation intact, DTR normal. Strength 5/5 in all 4 limbs.  Psychiatric: Normal judgment and insight. Alert and oriented x 3. Normal mood.     Labs on Admission: I have personally reviewed following labs and imaging studies  CBC:  Recent Labs Lab 04/01/17 1050 04/03/17 0708  WBC 16.7* 22.3*  NEUTROABS 12.9*  --   HGB 9.9* 9.5*  HCT 32.5* 29.9*  MCV 96.4 94.3  PLT 155 474*   Basic Metabolic Panel:  Recent Labs Lab 04/01/17 1050 04/03/17 0708  NA 138 138  K 4.6 4.4  CL  --  104  CO2 21* 24  GLUCOSE 114 73  BUN 22.7 22*  CREATININE 1.8* 1.02*  CALCIUM 13.9 Repeated and Verified* 12.0*  GFR: Estimated Creatinine Clearance: 56.1 mL/min (A) (by C-G formula based on SCr of 1.02 mg/dL (H)). Liver Function Tests:  Recent Labs Lab 04/01/17 1050 04/03/17 0708  AST 24 47*  ALT 10 15  ALKPHOS 74 72  BILITOT 0.48 0.7  PROT 8.1 8.3*  ALBUMIN 2.5* 2.9*   No results for input(s): LIPASE, AMYLASE in the last 168 hours. No results for input(s): AMMONIA in the last 168 hours. Coagulation Profile:  Recent Labs Lab 04/03/17 0708  INR 1.15   Cardiac Enzymes: No results for input(s): CKTOTAL, CKMB, CKMBINDEX, TROPONINI in the last 168 hours. BNP (last 3 results) No results for input(s): PROBNP in the last 8760 hours. HbA1C: No results for input(s): HGBA1C in the last 72 hours. CBG:  Recent Labs Lab 04/03/17 0720  GLUCAP 63*   Lipid Profile: No results for input(s): CHOL, HDL, LDLCALC, TRIG, CHOLHDL, LDLDIRECT in the last 72 hours. Thyroid Function Tests: No results for input(s): TSH, T4TOTAL, FREET4, T3FREE, THYROIDAB in the last 72 hours. Anemia Panel: No results for input(s): VITAMINB12, FOLATE, FERRITIN, TIBC, IRON, RETICCTPCT in the last 72 hours. Urine analysis:    Component Value Date/Time   COLORURINE YELLOW 04/03/2017 0910   APPEARANCEUR HAZY (A) 04/03/2017 0910   LABSPEC 1.017 04/03/2017 0910   PHURINE 5.0 04/03/2017 0910   GLUCOSEU NEGATIVE 04/03/2017 0910   HGBUR SMALL (A) 04/03/2017 0910   BILIRUBINUR NEGATIVE 04/03/2017 0910   BILIRUBINUR neg 03/09/2015 1333   KETONESUR NEGATIVE 04/03/2017 0910   PROTEINUR NEGATIVE 04/03/2017 0910   UROBILINOGEN 0.2 03/09/2015 1333   NITRITE NEGATIVE 04/03/2017 0910   LEUKOCYTESUR NEGATIVE 04/03/2017 0910   Sepsis Labs: '@LABRCNTIP' (procalcitonin:4,lacticidven:4) ) Recent Results (from the past 240 hour(s))  TECHNOLOGIST REVIEW     Status: None   Collection Time: 03/25/17 10:52 AM  Result Value Ref Range Status   Technologist Review Rouleaux present  Final  TECHNOLOGIST REVIEW     Status: None    Collection Time: 04/01/17 10:50 AM  Result Value Ref Range Status   Technologist Review Few Metas and Myelocytes present, rouleaux  Final     Radiological Exams on Admission: Dg Chest 2 View  Result Date: 04/03/2017 CLINICAL DATA:  Awakened today with increase cough an uncontrollable shaking. History of multiple myeloma on chemotherapy. EXAM: CHEST  2 VIEW COMPARISON:  Chest x-ray of April 25th 25 teen FINDINGS: The lungs are mildly hypoinflated. There is confluent alveolar opacity in the right perihilar region more conspicuous than on the previous study. This appears to reflect new infiltrate superimposed upon a posterior chest wall soft tissue mass. There are lateral left rib deformities which have appeared since the previous study. The cardiac silhouette is enlarged. The pulmonary vascularity is not engorged. There degenerative changes of both shoulders. The thoracic vertebral bodies are preserved in height. IMPRESSION: New right upper lobe pneumonia extending to the hilar region. Followup PA and lateral chest X-ray is recommended in 3-4 weeks following trial of antibiotic therapy to ensure resolution and exclude underlying malignancy. Persistent posterior right mid thoracic pleural based mass consistent with myelomatous involvement. New rib deformity on the left laterally involving the fifth and sixth ribs consistent with myelomatous involvement. Electronically Signed   By: David  Martinique M.D.   On: 04/03/2017 09:55    EKG: Independently reviewed. Sinus tach at 134 bpm  Assessment/Plan Principal Problem:   Lobar pneumonia in immune compromised patient - Vanc and Zosyn given in ER- already was treated with a Z pak a few wks ago - sputum  culture if possible - MBS as this is a RUL pneumonia  Active Problems: Acute on chronic leukocytosis - due to above, follow  Tachycardia, leukocytosis, mild lactic acidosis >> sepsis - given 2.5 L boluses in ER and antibiotics as mentioned    Multiple  myeloma not having achieved remission - received Velcade and Decadron on Monday per Dr [name]  Generalized weakness - PT eval- may need SNF if not able to stand    Back pain and LE neuropathy - Percocet and Neurontin    Fatty liver - noted on CT    Hypercalcemia of malignancy - received Zometa recently- slightly improved - cont slow IV hydration - add calcitonin and recheck at 10 PM to see if it helps    Protein calorie malnutrition - add ensure    Chemotherapy-induced neuropathy  - cont Neuontin   Glaucoma - cont eye drops  h/o HTN - currently not on Valsaran HCTZ    DVT prophylaxis: lovenox Code Status: DNR  Family Communication: brother  Disposition Plan: med/surg admission  Consults called: none Admission status: inpatient- will need > 2 midnights for IV antibiotics due to sepsis related to pneumonia in an immune compromised patient who is unable to stand and transfer to go to PCP.   [facility] MD [facility] Pager: [email or web address] Password [email or web address] 7PM-7AM, please contact night-coverage   04/03/2017, 3:25 PM

## 2017-04-03 NOTE — ED Notes (Signed)
PATIENT UNABLE TO COMPLETE ORTOASTATIC VITALS,SHE IS UNABLE TO SIT OR STAND

## 2017-04-03 NOTE — ED Notes (Signed)
Previous nurse reported being unable to obtain 2nd set of blood cultures and MD aware.

## 2017-04-03 NOTE — ED Provider Notes (Signed)
[facility] DEPT Provider Note   CSN: [id number] Arrival date & time: 04/03/17  4098     History   Chief Complaint Chief Complaint  Patient presents with  . Weakness  . Shaking    HPI Darlene Howard is a 66 y.o. female.  HPI Patient presents to the emergency room with complaints of generalized weakness. Patient has a history of multiple myeloma. She had an infusion on April 2. Patient states she was feeling okay other than her chronic back pain associated with the multiple myeloma. This morning she woke up and had to go to the bathroom but she was too weak to stand. She called EMS and was brought to the emergency room. Patient feels weak all over. She denies any numbness. She denies any fevers or chills. No vomiting or diarrhea. No chest pain or shortness of breath.  She's had the same type of infusion in the past but has not felt this week. Past Medical History:  Diagnosis Date  . Bruises easily    d/t being on COumadin  . Cholelithiasis   . DDD (degenerative disc disease), lumbar   . Dehydration 03/11/2017  . Fatty liver   . GERD (gastroesophageal reflux disease)    takes Pantoprazole daily  . Glaucoma    uses eye drops as instructed  . H/O stem cell transplant ([place])    04-26-2006  AT [facility]  . Herpes    takes Valtrex as needed  . History of colon polyps   . Hypercalcemia of malignancy 03/11/2017  . Hypertension    takes Amlodipine and Diovan daily  . Left radial fracture 01/21/2017  . Lung cancer ([place])    chest wall lesion aadn 6th rib  . Multiple myeloma DX  2006  STATE IIA,  IgA  KAPPA   STABLE  PER DR MOHAMED--  S/P STEM CELL TRANSPLANT 2007 /  RECURRENCE 2008  CHEMO ENDED 03-30-2008/   NOW ON MAINTANANCE REVLIMID )  . 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 Personal history of colon cancer    ASCENDING COLON--  S/P  RIGHT HEMICOLECTOMY WITH NEGATIVE NODES/    NO RECURRENCE  . Type 2 diabetes mellitus  ([place])   . Vocal cord nodule    resolved- see Dr. Deeann Saint notes in media tab 06/17/14    Patient Active Problem List   Diagnosis Date Noted  . Depression 03/18/2017  . Protein calorie malnutrition ([place]) 03/18/2017  . Hypercalcemia of malignancy 03/11/2017  . Dehydration 03/11/2017  . Anemia of chronic disease 02/11/2017  . ERRONEOUS ENCOUNTER--DISREGARD 01/27/2017  . Left radial fracture 01/21/2017  . Encounter for antineoplastic chemotherapy 08/27/2016  . Insomnia 05/23/2016  . Thrombocytopenia ([place]) 05/21/2016  . Multiple myeloma not having achieved remission ([place]) 03/12/2016  . Fatty liver 12/06/2014  . GERD (gastroesophageal reflux disease) 11/02/2014  . 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 Back pain with left-sided radiculopathy 02/22/2014  . Goiter 02/22/2014  . Acute bronchitis 08/25/2013  . Maxillary sinusitis, acute 12/29/2012  . DISORDER OF BONE AND CARTILAGE UNSPECIFIED 08/16/2010  . Allergic rhinitis 04/30/2010  . Carcinoma in situ of colon 09/28/2008  . Multiple myeloma ([place]) 05/03/2008  . Glaucoma 05/03/2008  . Essential hypertension 05/03/2008    Past Surgical History:  Procedure Laterality Date  . ABDOMINAL HYSTERECTOMY  1983   w/  appendectomy and left salpingoophorectom  . CATARACT EXTRACTION W/PHACO  Left 01/20/2014   Procedure: CATARACT EXTRACTION PHACO AND INTRAOCULAR LENS PLACEMENT (IOC);  Surgeon: Adonis Brook, MD;  Location: [facility];  Service: Ophthalmology;  Laterality: Left;  . CERVICAL CONE BIOPSY    . CHOLECYSTECTOMY N/A 02/28/2016   Procedure: LAPAROSCOPIC CHOLECYSTECTOMY;  Surgeon: Ralene Ok, MD;  Location: [facility];  Service: General;  Laterality: N/A;  . CO2  ABLATION VULVA PERIANAL AND VAGINAL DYSPLATIC AREAS  02-15-2010  . COLONOSCOPY  10/07/2006   WUJ:WJXBJY rectum/Status post right hemicolectomy. Normal residual colon  . COLONOSCOPY  10/2009   RMR:  diminutive RS polyp (hyperplastic).   . COLONOSCOPY N/A 12/15/2014   Procedure: COLONOSCOPY;  Surgeon: Daneil Dolin, MD;  [facility];  Service: Endoscopy;  Laterality: N/A;  130   . ESOPHAGOGASTRODUODENOSCOPY  04/02/2005   NWG:NFAOZH/YQMVHQIONGEX polyp with central erosion versus ulceration in the body of the stomach, resected and recovered Remainder of the gastric mucosa, D1, D2 appeared normal  . HEMICOLECTOMY Right 04-27-2005   CARCINOMA   ASCENDING COLON  . LESION REMOVAL N/A 05/06/2014   Procedure: LASER OF THE VAGINA;  Surgeon: Janie Morning, MD;  Location: [facility];  Service: Gynecology;  Laterality: N/A;  . LIMBAL STEM CELL TRANSPLANT  04-26-2006    ([facility])  . VULVAR LESION REMOVAL N/A 05/06/2014   Procedure: LASER OF VULVAR LESION;  Surgeon: Janie Morning, MD;  [facility];  Service: Gynecology;  Laterality: N/A;  . VULVECTOMY N/A 05/06/2014   Procedure: WIDE LOCAL EXCISION LEFT LABIA MAJORA;  Surgeon: Janie Morning, MD;  Location: [facility];  Service: Gynecology;  Laterality: N/A;    OB History    No data available       Home Medications    Prior to Admission medications   Medication Sig Start Date End Date Taking? Authorizing Provider  acetaminophen (TYLENOL) 500 MG tablet Take 500 mg by mouth daily as needed for moderate pain. Reported on 06/25/2016    Historical Provider, MD  aspirin (ASPIRIN LOW DOSE) 81 MG EC tablet Take 81 mg by mouth daily.      Historical Provider, MD  azithromycin (ZITHROMAX) 250 MG tablet Two tablets on day one, then one tablet once daily for an additional four days 03/14/17   Fayrene Helper, MD  benzonatate (TESSALON) 100 MG capsule Take 1 capsule (100 mg total) by mouth 2 (two) times daily as needed for cough. 03/14/17   Fayrene Helper, MD  calcium-vitamin D (OSCAL 500/200 D-3) 500-200 MG-UNIT per tablet Take 1 tablet by mouth 3 (three) times daily.      Historical  Provider, MD  cholecalciferol (VITAMIN D) 1000 UNITS tablet Take 1,000 Units by mouth daily. Reported on 04/24/2016    Historical Provider, MD  dexamethasone (DECADRON) 4 MG tablet TAKE 10 TABS ONCE WEEKLY  WHILE UNDERGOING VELCADE  INJECTIONS. 01/29/17   Curt Bears, MD  dorzolamide-timolol (COSOPT) 22.3-6.8 MG/ML ophthalmic solution Place 1 drop into the left eye 2 (two) times daily.  11/12/15   Historical Provider, MD  gabapentin (NEURONTIN) 100 MG capsule TAKE 1 CAPSULE BY MOUTH 3  TIMES DAILY 12/12/16   Fayrene Helper, MD  loperamide (IMODIUM A-D) 2 MG tablet Take 2 mg by mouth daily as needed for diarrhea or loose stools. Reported on 07/09/2016    Historical Provider, MD  loratadine (CLARITIN) 10 MG tablet TAKE ONE TABLET BY MOUTH ONCE DAILY. 04/02/17   Fayrene Helper, MD  LUMIGAN 0.01 % SOLN Place 1 drop into  both eyes at bedtime.  12/13/15   Historical Provider, MD  mirtazapine (REMERON) 30 MG tablet Take 1 tablet (30 mg total) by mouth at bedtime. 03/18/17   Maryanna Shape, NP  Multiple Vitamins-Minerals (CENTRUM SILVER PO) Take 1 tablet by mouth daily.     Historical Provider, MD  ondansetron (ZOFRAN) 4 MG tablet Take 1 tablet (4 mg total) by mouth every 6 (six) hours. 12/10/16   Curt Bears, MD  oxyCODONE-acetaminophen (PERCOCET/ROXICET) 5-325 MG tablet Take 1 tablet by mouth every 4 (four) hours as needed for severe pain. May take one tablet every 4-6 hours as needed for pain 04/01/17   Curt Bears, MD  pantoprazole (PROTONIX) 40 MG tablet TAKE 1 TABLET BY MOUTH  DAILY FOR ACID REFLUX 10/18/16   Fayrene Helper, MD  potassium chloride SA (K-DUR,KLOR-CON) 20 MEQ tablet TAKE 1 TABLET BY MOUTH  TWICE A DAY 10/18/16   Fayrene Helper, MD  PREMARIN vaginal cream PLACE 1 APPLICATORFUL VAGINALLY TWICE A WEEK. 06/29/16   Dorothyann Gibbs, NP  promethazine-dextromethorphan (PROMETHAZINE-DM) 6.25-15 MG/5ML syrup One teaspoon at bedtime as needed, for excess cough 12/28/16    Fayrene Helper, MD  temazepam (RESTORIL) 15 MG capsule TAKE 1 CAPSULE AT BEDTIME AS NEEDED FOR SLEEP. 08/23/16   Fayrene Helper, MD  valACYclovir (VALTREX) 1000 MG tablet TAKE 1 TABLET BY MOUTH  DAILY AS NEEDED 03/19/17   Fayrene Helper, MD  valsartan-hydrochlorothiazide (DIOVAN-HCT) 320-25 MG tablet Take 1 tablet by mouth  daily 08/28/16   Fayrene Helper, MD  valsartan-hydrochlorothiazide (DIOVAN-HCT) 320-25 MG tablet TAKE 1 TABLET BY MOUTH  DAILY 03/19/17   Fayrene Helper, MD    Family History Family History  Problem Relation Age of Onset  . Kidney failure Mother   . Hypertension Mother   . Prostate cancer Father 72  . Hypertension Father     vascular disease   . Stroke Father   . Hypertension Sister   . Hyperlipidemia Sister   . Hypertension Brother   . Hyperlipidemia Brother   . Hypertension Sister   . Hypertension Sister   . Colon cancer Neg Hx   . Breast cancer Neg Hx     Social History Social History  Substance Use Topics  . Smoking status: Never Smoker  . Smokeless tobacco: Never Used  . Alcohol use No     Allergies   Codeine and Tramadol   Review of Systems Review of Systems  All other systems reviewed and are negative.    Physical Exam Updated Vital Signs BP 131/69 (BP Location: Left Arm)   Pulse (!) 127   Temp 99.2 F (37.3 C) (Oral)   Resp 18   Ht '5\' 6"'  (1.676 m)   Wt 77.1 kg   SpO2 96%   BMI 27.44 kg/m   Physical Exam  Constitutional: No distress.  HENT:  Head: Normocephalic and atraumatic.  Right Ear: External ear normal.  Left Ear: External ear normal.  Eyes: Conjunctivae are normal. Right eye exhibits no discharge. Left eye exhibits no discharge. No scleral icterus.  Neck: Neck supple. No tracheal deviation present.  Cardiovascular: Regular rhythm and intact distal pulses.  Tachycardia present.   Pulmonary/Chest: Effort normal and breath sounds normal. No stridor. No respiratory distress. She has no wheezes. She has no  rales.  Abdominal: Soft. Bowel sounds are normal. She exhibits no distension. There is no tenderness. There is no rebound and no guarding.  Musculoskeletal: She exhibits no edema or tenderness.  Neurological: She is alert. She has normal strength. No cranial nerve deficit (no facial droop, extraocular movements intact, no slurred speech) or sensory deficit. She exhibits normal muscle tone. She displays no seizure activity. Coordination normal.  Skin: Skin is warm and dry. No rash noted. She is not diaphoretic.  Psychiatric: She has a normal mood and affect.  Nursing note and vitals reviewed.    ED Treatments / Results  Labs (all labs ordered are listed, but only abnormal results are displayed) Labs Reviewed  BASIC METABOLIC PANEL - Abnormal; Notable for the following:       Result Value   BUN 22 (*)    Creatinine, Ser 1.02 (*)    Calcium 12.0 (*)    GFR calc non Af Amer 56 (*)    All other components within normal limits  CBC - Abnormal; Notable for the following:    WBC 22.3 (*)    RBC 3.17 (*)    Hemoglobin 9.5 (*)    HCT 29.9 (*)    RDW 17.3 (*)    Platelets 148 (*)    All other components within normal limits  URINALYSIS, ROUTINE W REFLEX MICROSCOPIC - Abnormal; Notable for the following:    APPearance HAZY (*)    Hgb urine dipstick SMALL (*)    Bacteria, UA RARE (*)    Squamous Epithelial / LPF 0-5 (*)    All other components within normal limits  HEPATIC FUNCTION PANEL - Abnormal; Notable for the following:    Total Protein 8.3 (*)    Albumin 2.9 (*)    AST 47 (*)    All other components within normal limits  CBG MONITORING, ED - Abnormal; Notable for the following:    Glucose-Capillary 63 (*)    All other components within normal limits  CULTURE, BLOOD (ROUTINE X 2)  CULTURE, BLOOD (ROUTINE X 2)  PROTIME-INR  LACTIC ACID, PLASMA    EKG  EKG Interpretation None       Radiology Dg Chest 2 View  Result Date: 04/03/2017 CLINICAL DATA:  Awakened today with  increase cough an uncontrollable shaking. History of multiple myeloma on chemotherapy. EXAM: CHEST  2 VIEW COMPARISON:  Chest x-ray of April 25th 25 teen FINDINGS: The lungs are mildly hypoinflated. There is confluent alveolar opacity in the right perihilar region more conspicuous than on the previous study. This appears to reflect new infiltrate superimposed upon a posterior chest wall soft tissue mass. There are lateral left rib deformities which have appeared since the previous study. The cardiac silhouette is enlarged. The pulmonary vascularity is not engorged. There degenerative changes of both shoulders. The thoracic vertebral bodies are preserved in height. IMPRESSION: New right upper lobe pneumonia extending to the hilar region. Followup PA and lateral chest X-ray is recommended in 3-4 weeks following trial of antibiotic therapy to ensure resolution and exclude underlying malignancy. Persistent posterior right mid thoracic pleural based mass consistent with myelomatous involvement. New rib deformity on the left laterally involving the fifth and sixth ribs consistent with myelomatous involvement. Electronically Signed   By: David  Martinique M.D.   On: 04/03/2017 09:55    Procedures .Critical Care Performed by: Dorie Rank Authorized by: Dorie Rank   Critical care provider statement:    Critical care time (minutes):  30   Critical care was necessary to treat or prevent imminent or life-threatening deterioration of the following conditions:  Sepsis   Critical care was time spent personally by me on the following activities:  Discussions  with consultants, evaluation of patient's response to treatment, examination of patient, ordering and performing treatments and interventions, ordering and review of laboratory studies, ordering and review of radiographic studies, pulse oximetry, re-evaluation of patient's condition, obtaining history from patient or surrogate and review of old charts   (including  critical care time)  Medications Ordered in ED Medications  sodium chloride 0.9 % bolus 500 mL (not administered)  ceFEPIme (MAXIPIME) 2 g in dextrose 5 % 50 mL IVPB (not administered)  vancomycin (VANCOCIN) IVPB 1000 mg/200 mL premix (not administered)  sodium chloride 0.9 % bolus 1,000 mL (0 mLs Intravenous Stopped 04/03/17 0923)  sodium chloride 0.9 % bolus 1,000 mL (0 mLs Intravenous Stopped 04/03/17 1014)     Initial Impression / Assessment and Plan / ED Course  I have reviewed the triage vital signs and the nursing notes.  Pertinent labs & imaging results that were available during my care of the patient were reviewed by me and considered in my medical decision making (see chart for details).  Clinical Course as of Apr 04 1031  Wed Apr 03, 2017  0730 Previous vital signs reviewed. Blood pressure was 84/48 with a heart rate of 1:15 on April 2.  Repeat blood pressure few hours later was 106/58 with a heart rate of 118  [JK]  1025 The patient's chest x-ray shows a pneumonia.  She has been given 2 L of fluid. I will add on another 500 mL to equal a 30 mL/kg bolus.  She is blood pressure has remained stable however she does have persistent tachycardia. Lactic acid level is pending however her symptoms are concerning for the possibility of sepsis without septic shock.  [JK]  1030 Sepsis - Repeat Assessment  Performed at:    now    Heart:     Regular rate and rhythm and Tachycardic  Lungs:    CTA  Capillary Refill:   <2 sec  Peripheral Pulse:   Radial pulse palpable  Skin:     Normal Color      [JK]    Clinical Course User Index [JK] Dorie Rank, MD    Patient presented to the emergency room with complaints of generalized weakness. She has a history of multiple myeloma and had a treatment 2 days ago.  Laboratory tests do show an elevated white blood cell count.  Chest x-ray shows a new right-sided pneumonia.  I have ordered over 30 mL/kg bolus however the patient does remain  tachycardic.  Blood pressure has improved with the fluid bolus.  Most recent blood pressure is 915 systolic.  Will admit to the hospital for further treatment, IV abx, close monitoring.  Final Clinical Impressions(s) / ED Diagnoses   Final diagnoses:  HCAP (healthcare-associated pneumonia)  Multiple myeloma, remission status unspecified ([place])  Tachycardia  Sepsis, due to unspecified organism [facility])    New Prescriptions New Prescriptions   No medications on file     Dorie Rank, MD 04/03/17 1032

## 2017-04-03 NOTE — ED Notes (Signed)
MADE FIRST REQUEST FOR URINE SAMPLE

## 2017-04-03 NOTE — ED Triage Notes (Signed)
EMS reports pt started feeling weaker and shaky this morning, pt has CA, last infusion 4/2. VS 115/55-131-18-97% RA

## 2017-04-04 ENCOUNTER — Inpatient Hospital Stay (HOSPITAL_COMMUNITY): Payer: Medicare Other

## 2017-04-04 DIAGNOSIS — J189 Pneumonia, unspecified organism: Secondary | ICD-10-CM

## 2017-04-04 DIAGNOSIS — A419 Sepsis, unspecified organism: Principal | ICD-10-CM

## 2017-04-04 DIAGNOSIS — T451X5A Adverse effect of antineoplastic and immunosuppressive drugs, initial encounter: Secondary | ICD-10-CM

## 2017-04-04 DIAGNOSIS — M5410 Radiculopathy, site unspecified: Secondary | ICD-10-CM

## 2017-04-04 DIAGNOSIS — J181 Lobar pneumonia, unspecified organism: Secondary | ICD-10-CM

## 2017-04-04 DIAGNOSIS — G620 Drug-induced polyneuropathy: Secondary | ICD-10-CM

## 2017-04-04 DIAGNOSIS — C9000 Multiple myeloma not having achieved remission: Secondary | ICD-10-CM

## 2017-04-04 LAB — CBC
HEMATOCRIT: 27.7 % — AB (ref 36.0–46.0)
HEMOGLOBIN: 8.7 g/dL — AB (ref 12.0–15.0)
MCH: 29.6 pg (ref 26.0–34.0)
MCHC: 31.4 g/dL (ref 30.0–36.0)
MCV: 94.2 fL (ref 78.0–100.0)
Platelets: 133 10*3/uL — ABNORMAL LOW (ref 150–400)
RBC: 2.94 MIL/uL — ABNORMAL LOW (ref 3.87–5.11)
RDW: 17.4 % — ABNORMAL HIGH (ref 11.5–15.5)
WBC: 19.2 10*3/uL — AB (ref 4.0–10.5)

## 2017-04-04 LAB — BASIC METABOLIC PANEL
ANION GAP: 9 (ref 5–15)
BUN: 15 mg/dL (ref 6–20)
CO2: 19 mmol/L — AB (ref 22–32)
Calcium: 10.4 mg/dL — ABNORMAL HIGH (ref 8.9–10.3)
Chloride: 109 mmol/L (ref 101–111)
Creatinine, Ser: 0.77 mg/dL (ref 0.44–1.00)
GFR calc Af Amer: >60 mL/min (ref >60)
GLUCOSE: 104 mg/dL — AB (ref 65–99)
POTASSIUM: 4.2 mmol/L (ref 3.5–5.1)
Sodium: 137 mmol/L (ref 135–145)

## 2017-04-04 NOTE — Evaluation (Addendum)
Physical Therapy Evaluation Patient Details Name: Darlene Howard MRN: [id number] DOB: Nov 30, 1951 Today's Date: 04/04/2017   History of Present Illness   Darlene Howard is a 66 y.o. 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 She has been weak for about 2-3 wks now and has been using a walker at home and wheelchair outside. She was scheduled to receive a fluid bolus in the cancer center but was not able to walk and was brought in to the ER. Work up reveals a RUL pneumonia.     Clinical Impression  Pt admitted with above diagnosis. Pt currently with functional limitations due to the deficits listed below (see PT Problem List).  Pt will benefit from skilled PT to increase their independence and safety with mobility to allow discharge to the venue listed below.  Pt with generalized weakness and jerky movements noted in UE, but able to stand up and do SPT with RW to recliner.  Pt has been using her handicapped sister's walker and wheelchair, but will need her own equipment as she is leaving her sister without her needed DME.  Discussed d/c plans with pt and sister inlaw and pt wants to go home with family support.  Based on her performance today and the fact she has supportive family with 24 hour assistance, feel that this is a realistic d/c plan. Recommend HHPT and RW, 3-1 BSC, and w/c.      Follow Up Recommendations Home health PT;Supervision/Assistance - 24 hour    Equipment Recommendations  Rolling walker with 5" wheels;Wheelchair (measurements PT);Wheelchair cushion (measurements PT);3in1 (PT)    Recommendations for Other Services OT consult     Precautions / Restrictions Precautions Precautions: Other (comment) Precaution Comments: neuropenic  precautions Restrictions Weight Bearing Restrictions: No      Mobility  Bed Mobility Overal bed mobility: Needs Assistance Bed Mobility: Supine to Sit     Supine to sit: Mod assist     General bed mobility comments: MOD A to get trunk upright. Once sitting EOB, MIN A to get fully scooted for feet on the floor. Some jerky movement noted in UE.  Transfers Overall transfer level: Needs assistance Equipment used: Rolling walker (2 wheeled) Transfers: Sit to/from [facility] Sit to Stand: Min assist;+2 physical assistance Stand pivot transfers: Min assist;+2 safety/equipment       General transfer comment: stood with cues for hand placement. Close MIN A due to weakness in LE with SPT with RW.  Ambulation/Gait             General Gait Details: SPT only with RW  Stairs            Wheelchair Mobility    Modified Rankin (Stroke Patients Only)       Balance Overall balance assessment: Needs assistance   Sitting balance-Leahy Scale: Fair Sitting balance - Comments: tendency to go back     Standing balance-Leahy Scale: Poor Standing balance comment: requires UE assist                             Pertinent Vitals/Pain Pain Assessment: 0-10 Pain Score: 6  Pain Location: back Pain Descriptors / Indicators: Aching Pain Intervention(s): Limited activity within patient's tolerance;Repositioned    Home Living Family/patient expects to  be discharged to:: Private residence Living Arrangements: Other relatives Available Help at Discharge: Family;Available 24 hours/day Type of Home: House Home Access: Ramped entrance     Home Layout: Multi-level;Able to live on main level with bedroom/bathroom Home Equipment: Kasandra Knudsen - single point      Prior Function Level of Independence: Needs assistance   Gait / Transfers Assistance Needed: Had been walking with a cane til about 3 weeks ago when she started using her sister's SW and w/c.            Hand Dominance        Extremity/Trunk Assessment   Upper Extremity Assessment Upper Extremity Assessment: Generalized weakness    Lower Extremity Assessment Lower Extremity Assessment: Generalized weakness       Communication   Communication: No difficulties  Cognition Arousal/Alertness: Awake/alert Behavior During Therapy: WFL for tasks assessed/performed Overall Cognitive Status: Within Functional Limits for tasks assessed                                        General Comments      Exercises     Assessment/Plan    PT Assessment Patient needs continued PT services  PT Problem List Decreased strength;Decreased activity tolerance;Decreased balance;Decreased mobility;Decreased knowledge of use of DME       PT Treatment Interventions DME instruction;Gait training;Functional mobility training;Therapeutic activities;Therapeutic exercise    PT Goals (Current goals can be found in the Care Plan section)  Acute Rehab PT Goals Patient Stated Goal: go home with family PT Goal Formulation: With patient/family Time For Goal Achievement: 04/18/17 Potential to Achieve Goals: Good    Frequency Min 3X/week   Barriers to discharge        Co-evaluation               End of Session Equipment Utilized During Treatment: Gait belt Activity Tolerance: Patient tolerated treatment well;Patient limited by fatigue Patient left: in chair;with call bell/phone within reach;with family/visitor present Nurse Communication: Mobility status PT Visit Diagnosis: Unsteadiness on feet (R26.81);Muscle weakness (generalized) (M62.81);Difficulty in walking, not elsewhere classified (R26.2)    Time: 5830-9407 PT Time Calculation (min) (ACUTE ONLY): 23 min   Charges:   PT Evaluation $PT Eval Moderate Complexity: 1 Procedure PT Treatments $Therapeutic Activity: 8-22 mins   PT G Codes:        Kaitlin Ardito L. Tamala Julian, [place] Pager [phone] 04/04/2017   Galen Manila 04/04/2017, 1:34 PM

## 2017-04-04 NOTE — Care Management Note (Signed)
Case Management Note  Patient Details  Name: Darlene Howard MRN: [id number] Date of Birth: 16-Mar-1951  Subjective/Objective:        From [address] admitted with sepsis            Action/Plan:Date:  April 04, 2017 Chart reviewed for concurrent status and case management needs. Will continue to follow patient progress. Discharge Planning: following for needs Expected discharge date: [id number] Velva Harman, BSN, [place], [place]   Expected Discharge Date:   (unknown)               Expected Discharge Plan:  Home/Self Care  In-House Referral:     Discharge planning Services  CM Consult  Post Acute Care Choice:    Choice offered to:     DME Arranged:    DME Agency:     HH Arranged:    [place] Agency:     Status of Service:  In process, will continue to follow  If discussed at Long Length of Stay Meetings, dates discussed:    Additional Comments:  Leeroy Cha, RN 04/04/2017, 11:19 AM

## 2017-04-04 NOTE — Progress Notes (Signed)
PROGRESS NOTE  Darlene Howard XEX:[id number] DOB: [birth year] DOA: 04/03/2017 PCP: [name], MD   LOS: 1 day   Brief Narrative: Darlene Howard is a 66 y.o. 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) Active Problems:   Back pain with left-sided radiculopathy   Fatty liver   Multiple myeloma not having achieved remission ([facility])   Hypercalcemia of malignancy   Protein calorie malnutrition ([place])   Chemotherapy-induced neuropathy ([place])   Lobar pneumonia in immune compromised patient -Patient started on vancomycin and cefepime on admission, continue, will need IV antibiotics for at least 48-72 hours pending cultures - MBS as this is a RUL pneumonia  Acute on chronic leukocytosis -due to above, follow  Tachycardia, leukocytosis, mild lactic acidosis >> sepsis -given 2.5 L boluses in ER and antibiotics as mentioned -Sepsis physiology improving  Multiple myeloma not having achieved remission -received Velcade and Decadron on Monday per Dr [name] -Outpatient management  Generalized weakness -PT eval- may need SNF if not able to stand  Back pain and LE neuropathy -Percocet and Neurontin  Fatty liver -noted on CT  Hypercalcemia of malignancy -received Zometa recently- slightly improved -cont slow IV hydration -Status post dose of calcitonin on 4/4, improved this morning  Protein calorie malnutrition -add ensure  Chemotherapy-induced neuropathy  -cont Neuontin   Glaucoma -cont eye drops  h/o HTN -hold Valsaran HCTZ   DVT prophylaxis: Lovenox Code Status: Full Family Communication: d/w brother and sister  in law at bedside Disposition Plan: remain inpatient, home versus SNF on discharge  Consultants:   None   Procedures:   None   Antimicrobials:  Vancomycin 4/4 >>  Cefepime 4/4 >>  Subjective: -Feels better this morning, denies any chest pain, denies any shortness of breath.  Feels like her energy is coming back she feels stronger.  No nausea or vomiting  Objective: Vitals:   04/03/17 1530 04/03/17 1639 04/03/17 2107 04/04/17 0654  BP: 139/79 134/72 129/67 137/76  Pulse: (!) 121 (!) 120 (!) 127 (!) 109  Resp: (!) _0 Temp:  98.8 F (37.1 C) 98.7 F (37.1 C) 98.7 F (37.1 C)  TempSrc:  Oral Oral Oral  SpO2: 96% 98% 99% 97%  Weight:      Height:        Intake/Output Summary (Last 24 hours) at 04/04/17 1204 Last data filed at 04/04/17 0830  Gross per 24 hour  Intake          1523.75 ml  Output                0 ml  Net          1523.75 ml   Filed Weights   04/03/17 0715  Weight: 77.1 kg (170 lb)    Examination: Constitutional: NAD Vitals:   04/03/17 1530 04/03/17 1639 04/03/17 2107 04/04/17 0654  BP: 139/79 134/72 129/67 137/76  Pulse: (!) 121 (!) 120 (!) 127 (!) 109  Resp: (!) _1 Temp:  98.8 F (37.1 C) 98.7 F (37.1 C) 98.7 F (37.1 C)  TempSrc:  Oral Oral Oral  SpO2: 96% 98% 99% 97%  Weight:  Height:       Eyes: PERRL, lids and conjunctivae normal ENMT: Mucous membranes are moist. Neck: normal, supple, no masses, no thyromegaly Respiratory: clear to auscultation bilaterally, no wheezing, no crackles. Normal respiratory effort. Cardiovascular: Regular rate and rhythm, no murmurs / rubs / gallops. No LE edema. 2+ pedal pulses.  Abdomen: no tenderness. Bowel sounds positive. .  Skin: no rashes, lesions, ulcers. No induration Neurologic: CN 2-12 grossly intact. Strength 5/5 in all 4.  Psychiatric: Normal judgment and insight. Alert and oriented x 3. Normal mood.    Data Reviewed: I have personally reviewed following labs and  imaging studies  CBC:  Recent Labs Lab 04/01/17 1050 04/03/17 0708 04/03/17 2244 04/04/17 0809  WBC 16.7* 22.3* 19.4* 19.2*  NEUTROABS 12.9*  --   --   --   HGB 9.9* 9.5* 8.7* 8.7*  HCT 32.5* 29.9* 27.7* 27.7*  MCV 96.4 94.3 93.9 94.2  PLT 155 148* 116* 133*   Basic Metabolic Panel:  Recent Labs Lab 04/01/17 1050 04/03/17 0708 04/03/17 2057 04/04/17 0809  NA 138 138  --  137  K 4.6 4.4  --  4.2  CL  --  104  --  109  CO2 21* 24  --  19*  GLUCOSE 114 73  --  104*  BUN 22.7 22*  --  15  CREATININE 1.8* 1.02* 0.72 0.77  CALCIUM 13.9 Repeated and Verified* 12.0*  --  10.4*   GFR: Estimated Creatinine Clearance: 71.5 mL/min (by C-G formula based on SCr of 0.77 mg/dL). Liver Function Tests:  Recent Labs Lab 04/01/17 1050 04/03/17 0708  AST 24 47*  ALT 10 15  ALKPHOS 74 72  BILITOT 0.48 0.7  PROT 8.1 8.3*  ALBUMIN 2.5* 2.9*   No results for input(s): LIPASE, AMYLASE in the last 168 hours. No results for input(s): AMMONIA in the last 168 hours. Coagulation Profile:  Recent Labs Lab 04/03/17 0708  INR 1.15   Cardiac Enzymes: No results for input(s): CKTOTAL, CKMB, CKMBINDEX, TROPONINI in the last 168 hours. BNP (last 3 results) No results for input(s): PROBNP in the last 8760 hours. HbA1C: No results for input(s): HGBA1C in the last 72 hours. CBG:  Recent Labs Lab 04/03/17 0720  GLUCAP 63*   Lipid Profile: No results for input(s): CHOL, HDL, LDLCALC, TRIG, CHOLHDL, LDLDIRECT in the last 72 hours. Thyroid Function Tests: No results for input(s): TSH, T4TOTAL, FREET4, T3FREE, THYROIDAB in the last 72 hours. Anemia Panel: No results for input(s): VITAMINB12, FOLATE, FERRITIN, TIBC, IRON, RETICCTPCT in the last 72 hours. Urine analysis:    Component Value Date/Time   COLORURINE YELLOW 04/03/2017 0910   APPEARANCEUR HAZY (A) 04/03/2017 0910   LABSPEC 1.017 04/03/2017 0910   PHURINE 5.0 04/03/2017 0910   GLUCOSEU NEGATIVE 04/03/2017 0910   HGBUR  SMALL (A) 04/03/2017 0910   BILIRUBINUR NEGATIVE 04/03/2017 0910   BILIRUBINUR neg 03/09/2015 1333   KETONESUR NEGATIVE 04/03/2017 0910   PROTEINUR NEGATIVE 04/03/2017 0910   UROBILINOGEN 0.2 03/09/2015 1333   NITRITE NEGATIVE 04/03/2017 0910   LEUKOCYTESUR NEGATIVE 04/03/2017 0910   Sepsis Labs: Invalid input(s): PROCALCITONIN, LACTICIDVEN  Recent Results (from the past 240 hour(s))  TECHNOLOGIST REVIEW     Status: None   Collection Time: 04/01/17 10:50 AM  Result Value Ref Range Status   Technologist Review Few Metas and Myelocytes present, rouleaux  Final  Blood culture (routine x 2)     Status: None (Preliminary result)   Collection Time: 04/03/17  8:20 PM  Result Value Ref Range Status   Specimen Description   Final    BLOOD LEFT ARM Performed at [facility], [address], [place], [place] [address]    Special Requests   Final    IN PEDIATRIC BOTTLE Blood Culture adequate volume Performed at [facility], [place] [address] [place], [place] [address]    Culture PENDING  Incomplete   Report Status PENDING  Incomplete  Culture, blood (routine x 2) Call MD if unable to obtain prior to antibiotics being given     Status: None (Preliminary result)   Collection Time: 04/03/17  8:20 PM  Result Value Ref Range Status   Specimen Description   Final    BLOOD LEFT HAND Performed at [facility], [address] [place], [place] [address]    Special Requests   Final    IN PEDIATRIC BOTTLE Blood Culture results may not be optimal due to an inadequate volume of blood received in culture bottles Performed at [facility], [address], [place], [place] [address]    Culture PENDING  Incomplete   Report Status PENDING  Incomplete      Radiology Studies: Dg Chest 2 View  Result Date: 04/03/2017 CLINICAL DATA:  Awakened today with increase cough an uncontrollable shaking. History of multiple myeloma on chemotherapy. EXAM: CHEST  2 VIEW COMPARISON:  Chest x-ray  of April 25th 25 teen FINDINGS: The lungs are mildly hypoinflated. There is confluent alveolar opacity in the right perihilar region more conspicuous than on the previous study. This appears to reflect new infiltrate superimposed upon a posterior chest wall soft tissue mass. There are lateral left rib deformities which have appeared since the previous study. The cardiac silhouette is enlarged. The pulmonary vascularity is not engorged. There degenerative changes of both shoulders. The thoracic vertebral bodies are preserved in height. IMPRESSION: New right upper lobe pneumonia extending to the hilar region. Followup PA and lateral chest X-ray is recommended in 3-4 weeks following trial of antibiotic therapy to ensure resolution and exclude underlying malignancy. Persistent posterior right mid thoracic pleural based mass consistent with myelomatous involvement. New rib deformity on the left laterally involving the fifth and sixth ribs consistent with myelomatous involvement. Electronically Signed   By: David  Martinique M.D.   On: 04/03/2017 09:55     Scheduled Meds: . aspirin EC  81 mg Oral QPM  . calcium-vitamin D  1 tablet Oral TID  . ceFEPime (MAXIPIME) IV  1 g Intravenous Q8H  . cholecalciferol  1,000 Units Oral Daily  . dorzolamide-timolol  1 drop Left Eye BID  . enoxaparin (LOVENOX) injection  40 mg Subcutaneous Q24H  . feeding supplement (ENSURE ENLIVE)  237 mL Oral BID BM  . gabapentin  100 mg Oral TID  . latanoprost  1 drop Both Eyes QHS  . loratadine  10 mg Oral Daily  . mirtazapine  30 mg Oral QHS  . multivitamin with minerals  1 tablet Oral Daily  . pantoprazole  40 mg Oral Daily  . potassium chloride SA  20 mEq Oral BID  . vancomycin  1,000 mg Intravenous Q12H   Continuous Infusions: . sodium chloride 75 mL/hr at 04/04/17 0755    Marzetta Board, MD, PhD [facility] Pager [phone]  If 7PM-7AM, please contact night-coverage [email or web address] Password [facility] 04/04/2017, 12:04  PM

## 2017-04-04 NOTE — Progress Notes (Signed)
Modified Barium Swallow Progress Note  Patient Details  Name: Darlene Howard MRN: [id number] Date of Birth: [birth year]  Today's Date: 04/04/2017  Modified Barium Swallow completed.  Full report located under Chart Review in the Imaging Section.  Brief recommendations include the following:  Clinical Impression  Functional oropharyngeal swallow ability without aspiration/ frank penetration of any consistency tested.  Pt has strong timely swallow without residuals.  Of note, pt did cough x1 during testing.    Upon esophageal sweep at end of testing, pt appeared with slow clearance distally without awareness.  Water consumption faciliated clearance.   Pt may benefit from dedicated esophageal evaluation if MD indicates.  Using live video, educated pt and her sister Darlene Howard to findings.  Thanks for this referral. NO SLP follow up indicated.    Swallow Evaluation Recommendations   Recommended Consults: Consider esophageal assessment   SLP Diet Recommendations: Regular solids;Thin liquid   Liquid Administration via: Cup;Straw   Medication Administration: Whole meds with liquid   Supervision: Patient able to self feed   Compensations: Slow rate;Small sips/bites (drink liquids t/o meal)   Postural Changes: Remain semi-upright after after feeds/meals (Comment);Seated upright at 90 degrees   Oral Care Recommendations: Oral care BID        Luanna Salk, [facility] SLP [phone]

## 2017-04-05 ENCOUNTER — Ambulatory Visit: Payer: Medicare Other | Admitting: Nurse Practitioner

## 2017-04-05 ENCOUNTER — Other Ambulatory Visit: Payer: Medicare Other

## 2017-04-05 LAB — CBC
HEMATOCRIT: 26 % — AB (ref 36.0–46.0)
Hemoglobin: 8.1 g/dL — ABNORMAL LOW (ref 12.0–15.0)
MCH: 28.7 pg (ref 26.0–34.0)
MCHC: 31.2 g/dL (ref 30.0–36.0)
MCV: 92.2 fL (ref 78.0–100.0)
PLATELETS: 114 10*3/uL — AB (ref 150–400)
RBC: 2.82 MIL/uL — ABNORMAL LOW (ref 3.87–5.11)
RDW: 17.5 % — AB (ref 11.5–15.5)
WBC: 16.4 10*3/uL — ABNORMAL HIGH (ref 4.0–10.5)

## 2017-04-05 LAB — BASIC METABOLIC PANEL
Anion gap: 7 (ref 5–15)
BUN: 12 mg/dL (ref 6–20)
CHLORIDE: 113 mmol/L — AB (ref 101–111)
CO2: 20 mmol/L — AB (ref 22–32)
CREATININE: 0.81 mg/dL (ref 0.44–1.00)
Calcium: 10.5 mg/dL — ABNORMAL HIGH (ref 8.9–10.3)
GFR calc Af Amer: >60 mL/min (ref >60)
GFR calc non Af Amer: >60 mL/min (ref >60)
GLUCOSE: 107 mg/dL — AB (ref 65–99)
POTASSIUM: 4.2 mmol/L (ref 3.5–5.1)
Sodium: 140 mmol/L (ref 135–145)

## 2017-04-05 LAB — HIV ANTIBODY (ROUTINE TESTING W REFLEX): HIV Screen 4th Generation wRfx: NONREACTIVE

## 2017-04-05 LAB — CALCIUM, IONIZED: Calcium, Ionized, Serum: 6.2 mg/dL — ABNORMAL HIGH (ref 4.5–5.6)

## 2017-04-05 MED ORDER — LIP MEDEX EX OINT: TOPICAL_OINTMENT | CUTANEOUS | Status: DC | PRN

## 2017-04-05 NOTE — Progress Notes (Signed)
PROGRESS NOTE  Darlene Howard BSJ:[id number] DOB: 31-Jul-1951 DOA: 04/03/2017 PCP: [name], MD   LOS: 2 days   Brief Narrative: Darlene Howard is a 66 y.o. 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 & HTN currently not on medications, admitted on 4/4 with sepsis due to healthcare associated pneumonia  Assessment & Plan: Principal Problem:   Lobar pneumonia ([place]) Active Problems:   Back pain with left-sided radiculopathy   Fatty liver   Multiple myeloma not having achieved remission (HCC)   Hypercalcemia of malignancy   Protein calorie malnutrition ([place])   Chemotherapy-induced neuropathy ([place])   Lobar pneumonia in immune compromised patient -Patient started on vancomycin and cefepime on admission, continue, will need IV antibiotics for at least 48-72 hours pending cultures -Clinically improving, she was able to work with PT yesterday  Acute on chronic leukocytosis -due to above, follow  Tachycardia, leukocytosis, mild lactic acidosis >> sepsis -given 2.5 L boluses in ER and antibiotics as mentioned -Sepsis physiology improving, she is afebrile today  Multiple myeloma not having achieved remission -received Velcade and Decadron on Monday per Dr [name] -Outpatient management  Generalized weakness -PT eval recommend a home health PT, discussed this morning with the patient and the patient's family who is at bedside, they have excellent support at home, and patient wants to go home.  Will maximize home health when patient is discharged  Back pain and LE neuropathy -Percocet and Neurontin  Fatty liver -noted on CT  Hypercalcemia of malignancy -received Zometa recently- slightly improved -cont slow IV hydration -Status post dose of calcitonin on 4/4, improved this  morning  Protein calorie malnutrition -add ensure  Chemotherapy-induced neuropathy  -cont Neuontin   Glaucoma -cont eye drops  h/o HTN -hold Valsaran HCTZ   DVT prophylaxis: Lovenox Code Status: Full Family Communication: d/w brother and sister in law at bedside Disposition Plan: remain inpatient, home versus SNF on discharge  Consultants:   None   Procedures:   None   Antimicrobials:  Vancomycin 4/4 >>  Cefepime 4/4 >>  Subjective: -No complaints, continue to improve compared to yesterday.  No shortness of breath.  Was able to work with PT, still up and sat in the bedside chair  Objective: Vitals:   04/04/17 0654 04/04/17 1749 04/04/17 2122 04/05/17 0547  BP: 137/76 125/68 139/75 127/66  Pulse: (!) 109 (!) 112 (!) 118 (!) 107  Resp: _0 Temp: 98.7 F (37.1 C) 99.2 F (37.3 C) 99.8 F (37.7 C) 98.4 F (36.9 C)  TempSrc: Oral Oral Oral Oral  SpO2: 97% 99% 98% 100%  Weight:      Height:        Intake/Output Summary (Last 24 hours) at 04/05/17 1300 Last data filed at 04/05/17 0800  Gross per 24 hour  Intake             2590 ml  Output                0 ml  Net             2590 ml   Filed Weights   04/03/17 0715  Weight: 77.1 kg (170 lb)    Examination: Constitutional: NAD Vitals:   04/04/17 0654 04/04/17 1749 04/04/17 2122 04/05/17 0547  BP: 137/76 125/68 139/75 127/66  Pulse: (!) 109 (!)  112 (!) 118 (!) 107  Resp: _0 Temp: 98.7 F (37.1 C) 99.2 F (37.3 C) 99.8 F (37.7 C) 98.4 F (36.9 C)  TempSrc: Oral Oral Oral Oral  SpO2: 97% 99% 98% 100%  Weight:      Height:       Eyes: PERRL, lids and conjunctivae normal Respiratory: clear to auscultation bilaterally, no wheezing, no crackles. Normal respiratory effort. Cardiovascular: Regular rate and rhythm, no murmurs / rubs / gallops.  Abdomen: no tenderness. Bowel sounds positive. .  Skin: no rashes, lesions, ulcers Neurologic: non focal   Data Reviewed: I have  personally reviewed following labs and imaging studies  CBC:  Recent Labs Lab 04/01/17 1050 04/03/17 0708 04/03/17 2244 04/04/17 0809 04/05/17 0626  WBC 16.7* 22.3* 19.4* 19.2* 16.4*  NEUTROABS 12.9*  --   --   --   --   HGB 9.9* 9.5* 8.7* 8.7* 8.1*  HCT 32.5* 29.9* 27.7* 27.7* 26.0*  MCV 96.4 94.3 93.9 94.2 92.2  PLT 155 148* 116* 133* 267*   Basic Metabolic Panel:  Recent Labs Lab 04/01/17 1050 04/03/17 0708 04/03/17 2057 04/04/17 0809 04/05/17 0626  NA 138 138  --  137 140  K 4.6 4.4  --  4.2 4.2  CL  --  104  --  109 113*  CO2 21* 24  --  19* 20*  GLUCOSE 114 73  --  104* 107*  BUN 22.7 22*  --  15 12  CREATININE 1.8* 1.02* 0.72 0.77 0.81  CALCIUM 13.9 Repeated and Verified* 12.0*  --  10.4* 10.5*   GFR: Estimated Creatinine Clearance: 70.6 mL/min (by C-G formula based on SCr of 0.81 mg/dL). Liver Function Tests:  Recent Labs Lab 04/01/17 1050 04/03/17 0708  AST 24 47*  ALT 10 15  ALKPHOS 74 72  BILITOT 0.48 0.7  PROT 8.1 8.3*  ALBUMIN 2.5* 2.9*   No results for input(s): LIPASE, AMYLASE in the last 168 hours. No results for input(s): AMMONIA in the last 168 hours. Coagulation Profile:  Recent Labs Lab 04/03/17 0708  INR 1.15   Cardiac Enzymes: No results for input(s): CKTOTAL, CKMB, CKMBINDEX, TROPONINI in the last 168 hours. BNP (last 3 results) No results for input(s): PROBNP in the last 8760 hours. HbA1C: No results for input(s): HGBA1C in the last 72 hours. CBG:  Recent Labs Lab 04/03/17 0720  GLUCAP 63*   Lipid Profile: No results for input(s): CHOL, HDL, LDLCALC, TRIG, CHOLHDL, LDLDIRECT in the last 72 hours. Thyroid Function Tests: No results for input(s): TSH, T4TOTAL, FREET4, T3FREE, THYROIDAB in the last 72 hours. Anemia Panel: No results for input(s): VITAMINB12, FOLATE, FERRITIN, TIBC, IRON, RETICCTPCT in the last 72 hours. Urine analysis:    Component Value Date/Time   COLORURINE YELLOW 04/03/2017 0910   APPEARANCEUR  HAZY (A) 04/03/2017 0910   LABSPEC 1.017 04/03/2017 0910   PHURINE 5.0 04/03/2017 0910   GLUCOSEU NEGATIVE 04/03/2017 0910   HGBUR SMALL (A) 04/03/2017 0910   BILIRUBINUR NEGATIVE 04/03/2017 0910   BILIRUBINUR neg 03/09/2015 1333   KETONESUR NEGATIVE 04/03/2017 0910   PROTEINUR NEGATIVE 04/03/2017 0910   UROBILINOGEN 0.2 03/09/2015 1333   NITRITE NEGATIVE 04/03/2017 0910   LEUKOCYTESUR NEGATIVE 04/03/2017 0910   Sepsis Labs: Invalid input(s): PROCALCITONIN, LACTICIDVEN  Recent Results (from the past 240 hour(s))  TECHNOLOGIST REVIEW     Status: None   Collection Time: 04/01/17 10:50 AM  Result Value Ref Range Status   Technologist Review Few [place] and  Myelocytes present, rouleaux  Final  Blood culture (routine x 2)     Status: None (Preliminary result)   Collection Time: 04/03/17 10:10 AM  Result Value Ref Range Status   Specimen Description BLOOD LEFT WRIST  Final   Special Requests IN PEDIATRIC BOTTLE Blood Culture adequate volume  Final   Culture   Final    NO GROWTH 1 DAY Performed at [facility], [place] [address] [place], [place] [address]    Report Status PENDING  Incomplete  Blood culture (routine x 2)     Status: None (Preliminary result)   Collection Time: 04/03/17  8:20 PM  Result Value Ref Range Status   Specimen Description   Final    BLOOD LEFT ARM Performed at [facility], [address], [place], [place] [address]    Special Requests IN PEDIATRIC BOTTLE Blood Culture adequate volume  Final   Culture   Final    NO GROWTH < 24 HOURS Performed at [facility], [place] [address], [place], [place] [address]    Report Status PENDING  Incomplete  Culture, blood (routine x 2) Call MD if unable to obtain prior to antibiotics being given     Status: None (Preliminary result)   Collection Time: 04/03/17  8:20 PM  Result Value Ref Range Status   Specimen Description   Final    BLOOD LEFT HAND Performed at [facility], [address]  [place], [place] [address]    Special Requests   Final    IN PEDIATRIC BOTTLE Blood Culture results may not be optimal due to an inadequate volume of blood received in culture bottles   Culture   Final    NO GROWTH < 24 HOURS Performed at [facility], [place] [address] [place], [place] [address]    Report Status PENDING  Incomplete      Radiology Studies: Dg Swallowing Func-speech Pathology  Result Date: 04/04/2017 Objective Swallowing Evaluation: Type of Study: Bedside Swallow Evaluation Patient Details Name: RENELLA STEIG MRN: [id number] Date of Birth: Jun 16, 1951 Today's Date: 04/04/2017 Time: SLP Start Time (ACUTE ONLY): 1418-SLP Stop Time (ACUTE ONLY): 1433 SLP Time Calculation (min) (ACUTE ONLY): 15 min Past Medical History: Past Medical History: Diagnosis Date . Bruises easily   d/t being on COumadin . Cholelithiasis  . DDD (degenerative disc disease), lumbar  . Dehydration 03/11/2017 . Fatty liver  . GERD (gastroesophageal reflux disease)   takes Pantoprazole daily . Glaucoma   uses eye drops as instructed . H/O stem cell transplant ([place])   04-26-2006  AT [facility] . Herpes   takes Valtrex as needed . History of colon polyps  . Hypercalcemia of malignancy 03/11/2017 . Hypertension   takes Amlodipine and Diovan daily . Left radial fracture 01/21/2017 . Lung cancer ([place])   chest wall lesion aadn 6th rib . Multiple myeloma DX  2006  STATE IIA,  IgA  KAPPA  STABLE  PER DR MOHAMED--  S/P STEM CELL TRANSPLANT 2007 /  RECURRENCE 2008  CHEMO ENDED 03-30-2008/   NOW ON MAINTANANCE REVLIMID ) . Neuropathy associated with multiple myeloma (HCC)   POST CHEMOTHERAPY AND STEM CELL TRANSPLANT . Personal history of colon cancer   ASCENDING COLON--  S/P  RIGHT HEMICOLECTOMY WITH NEGATIVE NODES/    NO RECURRENCE . Type 2 diabetes mellitus ([place])  . Vocal cord nodule   resolved- see Dr. Deeann Saint notes in media tab 06/17/14 Past Surgical History: Past Surgical History: Procedure Laterality Date . ABDOMINAL  HYSTERECTOMY  1983  w/   appendectomy and left salpingoophorectom . CATARACT EXTRACTION W/PHACO Left 01/20/2014  Procedure: CATARACT EXTRACTION PHACO AND INTRAOCULAR LENS PLACEMENT (IOC);  Surgeon: Adonis Brook, MD;  Location: [facility];  Service: Ophthalmology;  Laterality: Left; . CERVICAL CONE BIOPSY   . CHOLECYSTECTOMY N/A 02/28/2016  Procedure: LAPAROSCOPIC CHOLECYSTECTOMY;  Surgeon: Ralene Ok, MD;  Location: [facility];  Service: General;  Laterality: N/A; . CO2  ABLATION VULVA PERIANAL AND VAGINAL DYSPLATIC AREAS  02-15-2010 . COLONOSCOPY  10/07/2006  ZOX:[id number] rectum/Status post right hemicolectomy. Normal residual colon . COLONOSCOPY  10/2009  RMR: diminutive RS polyp (hyperplastic).  . COLONOSCOPY N/A 12/15/2014  Procedure: COLONOSCOPY;  Surgeon: Daneil Dolin, MD;  [facility];  Service: Endoscopy;  Laterality: N/A;  130  . ESOPHAGOGASTRODUODENOSCOPY  04/02/2005  WUJ:[id number] polyp with central erosion versus ulceration in the body of the stomach, resected and recovered Remainder of the gastric mucosa, D1, D2 appeared normal . HEMICOLECTOMY Right 04-27-2005  CARCINOMA   ASCENDING COLON . LESION REMOVAL N/A 05/06/2014  Procedure: LASER OF THE VAGINA;  Surgeon: Janie Morning, MD;  Location: [facility];  Service: Gynecology;  Laterality: N/A; . LIMBAL STEM CELL TRANSPLANT  04-26-2006    ([facility]) . VULVAR LESION REMOVAL N/A 05/06/2014  Procedure: LASER OF VULVAR LESION;  Surgeon: Janie Morning, MD;  Location: [facility];  Service: Gynecology;  Laterality: N/A; . VULVECTOMY N/A 05/06/2014  Procedure: WIDE LOCAL EXCISION LEFT LABIA MAJORA;  Surgeon: Janie Morning, MD;  Location: [facility];  Service: Gynecology;  Laterality: N/A; HPI:  66 y.o. 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  Pt found to have right UL pna, concerning for aspiration.  MD ordered MBS.   No Data Recorded Assessment / Plan / Recommendation CHL IP CLINICAL IMPRESSIONS 04/04/2017 Clinical Impression Functional oropharyngeal swallow ability without aspiration/ frank penetration of any consistency tested.  Pt has strong timely swallow without residuals.  Of note, pt did cough x1 during testing.  Upon esophageal sweep at end of testing, pt appeared with slow clearance distally without awareness.  Water consumption faciliated clearance. Pt may benefit from dedicated esophageal evaluation if MD indicates.  Using live video, educated pt and her sister [name] to findings.  Thanks for this referral. NO SLP follow up indicated.  SLP Visit Diagnosis Dysphagia, unspecified (R13.10) Attention and concentration deficit following -- Frontal lobe and executive function deficit following -- Impact on safety and function Mild aspiration risk   No flowsheet data found.  No flowsheet data found. CHL IP DIET RECOMMENDATION 04/04/2017 SLP Diet Recommendations Regular solids;Thin liquid Liquid Administration via Cup;Straw Medication Administration Whole meds with liquid Compensations Slow rate;Small sips/bites Postural Changes Remain semi-upright after after feeds/meals (Comment);Seated upright at 90 degrees   CHL IP OTHER RECOMMENDATIONS 04/04/2017 Recommended Consults Consider esophageal assessment Oral Care Recommendations Oral care BID Other Recommendations --   CHL IP FOLLOW UP RECOMMENDATIONS 04/04/2017 Follow up Recommendations None   No flowsheet data found.     CHL IP ORAL PHASE 04/04/2017 Oral Phase WFL Oral - Pudding Teaspoon -- Oral - Pudding Cup -- Oral - Honey Teaspoon -- Oral - Honey Cup -- Oral - Nectar Teaspoon -- Oral - Nectar Cup WFL Oral - Nectar Straw -- Oral - Thin Teaspoon -- Oral -  Thin Cup [facility] Oral - Thin Straw WFL Oral - Puree WFL Oral - Mech Soft -- Oral - Regular WFL Oral - Multi-Consistency --  Oral - Pill WFL Oral Phase - Comment --  CHL IP PHARYNGEAL PHASE 04/04/2017 Pharyngeal Phase WFL Pharyngeal- Pudding Teaspoon -- Pharyngeal -- Pharyngeal- Pudding Cup -- Pharyngeal -- Pharyngeal- Honey Teaspoon -- Pharyngeal -- Pharyngeal- Honey Cup -- Pharyngeal -- Pharyngeal- Nectar Teaspoon -- Pharyngeal -- Pharyngeal- Nectar Cup WFL Pharyngeal -- Pharyngeal- Nectar Straw -- Pharyngeal -- Pharyngeal- Thin Teaspoon -- Pharyngeal -- Pharyngeal- Thin Cup WFL;Penetration/Aspiration during swallow Pharyngeal Material enters airway, remains ABOVE vocal cords then ejected out Pharyngeal- Thin Straw WFL Pharyngeal -- Pharyngeal- Puree WFL Pharyngeal -- Pharyngeal- Mechanical Soft -- Pharyngeal -- Pharyngeal- Regular WFL Pharyngeal -- Pharyngeal- Multi-consistency -- Pharyngeal -- Pharyngeal- Pill WFL Pharyngeal -- Pharyngeal Comment --  CHL IP CERVICAL ESOPHAGEAL PHASE 04/04/2017 Cervical Esophageal Phase WFL Pudding Teaspoon -- Pudding Cup -- Honey Teaspoon -- Honey Cup -- Nectar Teaspoon -- Nectar Cup -- Nectar Straw -- Thin Teaspoon -- Thin Cup -- Thin Straw -- Puree -- Mechanical Soft -- Regular -- Multi-consistency -- Pill -- Cervical Esophageal Comment -- No flowsheet data found. Janett Labella [place], [facility] SLP [phone]                Scheduled Meds: . aspirin EC  81 mg Oral QPM  . ceFEPime (MAXIPIME) IV  1 g Intravenous Q8H  . cholecalciferol  1,000 Units Oral Daily  . dorzolamide-timolol  1 drop Left Eye BID  . enoxaparin (LOVENOX) injection  40 mg Subcutaneous Q24H  . feeding supplement (ENSURE ENLIVE)  237 mL Oral BID BM  . gabapentin  100 mg Oral TID  . latanoprost  1 drop Both Eyes QHS  . loratadine  10 mg Oral Daily  . mirtazapine  30 mg Oral QHS  . multivitamin with minerals  1 tablet Oral Daily  . pantoprazole  40 mg Oral Daily  . potassium chloride SA  20 mEq Oral BID  . vancomycin  1,000 mg Intravenous Q12H   Continuous Infusions: . sodium chloride 75 mL/hr at 04/05/17  0210    Marzetta Board, MD, PhD [facility] Pager [phone]  If 7PM-7AM, please contact night-coverage [email or web address] Password [email or web address] 04/05/2017, 1:00 PM

## 2017-04-05 NOTE — Progress Notes (Signed)
qPhysical Therapy Treatment Patient Details Name: Darlene Howard MRN: [id number] DOB: 12/26/1951 Today's Date: 04/05/2017    History of Present Illness  Darlene Howard is a 66 y.o. 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 She has been weak for about 2-3 wks now and has been using a walker at home and wheelchair outside. She was scheduled to receive a fluid bolus in the cancer center but was not able to walk and was brought in to the ER. Work up reveals a RUL pneumonia.     PT Comments    Assisted Pt OOB to BSC then amb in hallway 2 short distance due to B LE weakness.  Pt plans to return home with family support.   Follow Up Recommendations  Home health PT;Supervision/Assistance - 24 hour     Equipment Recommendations  Rolling walker with 5" wheels;Wheelchair (measurements PT);Wheelchair cushion (measurements PT);3in1 (PT)    Recommendations for Other Services       Precautions / Restrictions Precautions Precaution Comments: CHEMO precautions Restrictions Weight Bearing Restrictions: No    Mobility  Bed Mobility Overal bed mobility: Needs Assistance Bed Mobility: Supine to Sit     Supine to sit: Mod assist     General bed mobility comments: increased time, HOB elevated and use of rail   Transfers Overall transfer level: Needs assistance Equipment used: Rolling walker (2 wheeled) Transfers: Sit to/from Stand;Stand Pivot Transfers Sit to Stand: Min assist;+2 physical assistance Stand pivot transfers: Min assist;+2 safety/equipment       General transfer comment: 25% VC's on proper hand placement and safety with turn completion  Ambulation/Gait Ambulation/Gait assistance: Mod assist;Min assist Ambulation Distance (Feet): 22 Feet (11 feet x  2) Assistive device: Rolling walker (2 wheeled) Gait Pattern/deviations: Step-to pattern;Step-through pattern Gait velocity: decreased   General Gait Details: + 2 assist for safety such that recliner is following    Stairs            Wheelchair Mobility    Modified Rankin (Stroke Patients Only)       Balance                                            Cognition Arousal/Alertness: Awake/alert Behavior During Therapy: WFL for tasks assessed/performed Overall Cognitive Status: Within Functional Limits for tasks assessed                                        Exercises      General Comments        Pertinent Vitals/Pain Pain Assessment: No/denies pain    Home Living                      Prior Function            PT Goals (current goals can now be found in the care plan section) Progress towards PT goals: Progressing toward goals    Frequency    Min 3X/week      PT Plan Current plan remains appropriate    Co-evaluation               End of Session Equipment Utilized During Treatment: Gait belt Activity Tolerance: Patient tolerated treatment well Patient left: in chair;with chair alarm set Nurse Communication: Mobility status PT Visit Diagnosis: Unsteadiness on feet (R26.81);Muscle weakness (generalized) (M62.81);Difficulty in walking, not elsewhere classified (R26.2)     Time: 1112-1135 PT Time Calculation (min) (ACUTE ONLY): 23 min  Charges:  $Gait Training: 8-22 mins $Therapeutic Activity: 8-22 mins                    G Codes:       Lori Kropski  PTA [facility]  Acute  Rehab Pager      [phone]  

## 2017-04-06 LAB — COMPREHENSIVE METABOLIC PANEL
ALK PHOS: 63 U/L (ref 38–126)
ALT: 14 U/L (ref 14–54)
ANION GAP: 8 (ref 5–15)
AST: 24 U/L (ref 15–41)
Albumin: 1.9 g/dL — ABNORMAL LOW (ref 3.5–5.0)
BUN: 12 mg/dL (ref 6–20)
CALCIUM: 10.4 mg/dL — AB (ref 8.9–10.3)
CHLORIDE: 111 mmol/L (ref 101–111)
CO2: 20 mmol/L — AB (ref 22–32)
Creatinine, Ser: 0.83 mg/dL (ref 0.44–1.00)
GFR calc non Af Amer: >60 mL/min (ref >60)
Glucose, Bld: 83 mg/dL (ref 65–99)
Potassium: 4.2 mmol/L (ref 3.5–5.1)
Sodium: 139 mmol/L (ref 135–145)
Total Bilirubin: 0.8 mg/dL (ref 0.3–1.2)
Total Protein: 6.3 g/dL — ABNORMAL LOW (ref 6.5–8.1)

## 2017-04-06 LAB — CBC
HCT: 25 % — ABNORMAL LOW (ref 36.0–46.0)
HEMOGLOBIN: 7.9 g/dL — AB (ref 12.0–15.0)
MCH: 29.8 pg (ref 26.0–34.0)
MCHC: 31.6 g/dL (ref 30.0–36.0)
MCV: 94.3 fL (ref 78.0–100.0)
PLATELETS: 121 10*3/uL — AB (ref 150–400)
RBC: 2.65 MIL/uL — AB (ref 3.87–5.11)
RDW: 17.7 % — AB (ref 11.5–15.5)
WBC: 17.3 10*3/uL — AB (ref 4.0–10.5)

## 2017-04-06 MED ORDER — LEVOFLOXACIN 500 MG PO TABS
500.0000 mg | ORAL_TABLET | Freq: Every day | ORAL | Status: DC
  Administered 2017-04-06 – 2017-04-07 (×2): 500 mg via ORAL
  Filled 2017-04-06 (×2): qty 1

## 2017-04-06 MED ORDER — GUAIFENESIN-DM 100-10 MG/5ML PO SYRP
5.0000 mL | ORAL_SOLUTION | ORAL | Status: DC | PRN
  Administered 2017-04-06: 5 mL via ORAL
  Filled 2017-04-06: qty 10

## 2017-04-06 NOTE — Evaluation (Signed)
Occupational Therapy Evaluation Patient Details Name: Darlene Howard MRN: [id number] DOB: 02-07-1951 Today's Date: 04/06/2017    History of Present Illness  Darlene Howard is a 66 y.o. 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 She has been weak for about 2-3 wks now and has been using a walker at home and wheelchair outside. She was scheduled to receive a fluid bolus in the cancer center but was not able to walk and was brought in to the ER. Work up reveals a RUL pneumonia.    Clinical Impression   Pt was admitted for the above. Pt has had a recent decline in ADL function; she had been independent prior to a couple of weeks ago.  She is very motivated and will benefit from continued OT in acute setting. Goals are for min guard level mostly.  Will reinforce energy conservation and further educate her on a tub bench as an option for increasing safety with bathing    Follow Up Recommendations  Home health OT;Supervision/Assistance - 24 hour    Equipment Recommendations  3 in 1 bedside commode    Recommendations for Other Services       Precautions / Restrictions Precautions Precautions: Other (comment) Precaution Comments: CHEMO precautions Restrictions Weight Bearing Restrictions: No      Mobility Bed Mobility         Supine to sit: Min assist     General bed mobility comments: assist for trunk  Transfers   Equipment used: Rolling walker (2 wheeled)   Sit to Stand: Min assist         General transfer comment: cues to scoot forward and for hand placement    Balance             Standing balance-Leahy Scale: Poor Standing balance comment: requires UE assist                           ADL either performed or assessed with clinical  judgement   ADL Overall ADL's : Needs assistance/impaired     Grooming: Min guard;Standing   Upper Body Bathing: Set up;Sitting   Lower Body Bathing: Moderate assistance;Sit to/from stand   Upper Body Dressing : Minimal assistance;Sitting (lines)   Lower Body Dressing: Maximal assistance;Sit to/from stand   Toilet Transfer: Minimal assistance;Ambulation;RW (chair)   Toileting- Clothing Manipulation and Hygiene: Minimal assistance;Sit to/from stand         General ADL Comments: ambulated to bathroom twice. HR up to 122. Rest breaks given between tasks.  Cued to scoot forward in chair for sit to stand.  Although she needed min A from both bed and chair, she needed less assistance from chair (lower) with armrests to push up from.  Educated on energy conservation. Pt may want to consider tub bench vs sponge bathing initially.       Vision         Perception     Praxis      Pertinent Vitals/Pain Pain Assessment: 0-10 Pain Score: 6  Pain Location: L side Pain Descriptors / Indicators: Aching Pain Intervention(s): Limited activity within patient's tolerance;Monitored during session;Premedicated before session;Repositioned     Hand Dominance     Extremity/Trunk Assessment Upper Extremity Assessment Upper Extremity Assessment: Generalized weakness (educated to perform  bil UE AROM)           Communication Communication Communication: No difficulties   Cognition Arousal/Alertness: Awake/alert Behavior During Therapy: WFL for tasks assessed/performed Overall Cognitive Status: Within Functional Limits for tasks assessed                                     General Comments       Exercises     Shoulder Instructions      Home Living Family/patient expects to be discharged to:: Private residence Living Arrangements: Other relatives Available Help at Discharge: Family;Available 24 hours/day               Bathroom Shower/Tub: Animal nutritionist: Standard     Home Equipment: Cane - single point          Prior Functioning/Environment Level of Independence: Needs assistance  Gait / Transfers Assistance Needed: Had been walking with a cane til about 3 weeks ago when she started using her sister's SW and w/c.              OT Problem List: Decreased strength;Decreased activity tolerance;Impaired balance (sitting and/or standing);Decreased knowledge of use of DME or AE;Pain      OT Treatment/Interventions: Self-care/ADL training;DME and/or AE instruction;Balance training;Energy conservation;Therapeutic exercise;Therapeutic activities    OT Goals(Current goals can be found in the care plan section) Acute Rehab OT Goals Patient Stated Goal: go home with family OT Goal Formulation: With patient Time For Goal Achievement: 04/13/17 Potential to Achieve Goals: Good ADL Goals Pt Will Perform Grooming: with min guard assist;standing (1 task) Pt Will Transfer to Toilet: with min guard assist;ambulating;bedside commode Additional ADL Goal #1: pt will verbalize 3 energy conservation strategies and initiate one rest break without cues Additional ADL Goal #2: pt will verbalize vs demonstrate use of tub bench (if interested)  OT Frequency: Min 2X/week   Barriers to D/C:            Co-evaluation              End of Session    Activity Tolerance: Patient tolerated treatment well Patient left: in chair;with call bell/phone within reach;with family/visitor present  OT Visit Diagnosis: Unsteadiness on feet (R26.81)                Time: 8115-7262 OT Time Calculation (min): 33 min Charges:  OT General Charges $OT Visit: 1 Procedure OT Evaluation $OT Eval Moderate Complexity: 1 Procedure OT Treatments $Therapeutic Activity: 8-22 mins G-Codes:     [place], OTR/L [phone] 04/06/2017  Darlene Howard 04/06/2017, 3:35 PM

## 2017-04-06 NOTE — Progress Notes (Signed)
PROGRESS NOTE  TEWANA BOHLEN IDH:[id number] DOB: 1951-10-17 DOA: 04/03/2017 PCP: [name], MD   LOS: 3 days   Brief Narrative: Darlene Howard is a 66 y.o. 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 & HTN currently not on medications, admitted on 4/4 with sepsis due to healthcare associated pneumonia  Assessment & Plan: Principal Problem:   Lobar pneumonia ([place]) Active Problems:   Back pain with left-sided radiculopathy   Fatty liver   Multiple myeloma not having achieved remission ([place])   Hypercalcemia of malignancy   Protein calorie malnutrition ([place])   Chemotherapy-induced neuropathy ([place])   Lobar pneumonia in immune compromised patient -Patient started on vancomycin and cefepime on admission, was on broad-spectrum antibiotics for 72 hours, patient is afebrile, clinically improved, will narrow to Levaquin.  Anticipate discharge tomorrow if clinically remains stable  Acute on chronic leukocytosis -due to above, follow  Tachycardia, leukocytosis, mild lactic acidosis >> sepsis -given 2.5 L boluses in ER and antibiotics as mentioned -Sepsis physiology resolved, she is afebrile today  Multiple myeloma not having achieved remission -received Velcade and Decadron on Monday per Dr [name] -Outpatient management  Generalized weakness -PT eval recommend a home health PT, discussed this morning with the patient and the patient's family who is at bedside, they have excellent support at home, and patient wants to go home.  Will maximize home health when patient is discharged  Back pain and LE neuropathy -Percocet and Neurontin  Fatty liver -noted on CT  Hypercalcemia of malignancy -received Zometa recently- slightly improved -cont slow IV hydration -Status post dose of calcitonin  on 4/4, improved  Protein calorie malnutrition -add ensure  Chemotherapy-induced neuropathy  -cont Neuontin   Glaucoma -cont eye drops  h/o HTN -hold Valsaran HCTZ   DVT prophylaxis: Lovenox Code Status: Full Family Communication: d/w brother and sister in law at bedside Disposition Plan: remain inpatient, home in 1 day  Consultants:   None   Procedures:   None   Antimicrobials:  Vancomycin 4/4 >> 4/7  Cefepime 4/4 >> 4/7  Levaquin 4/7 >>  Subjective: -No complaints, improving. Breathing better, no chest pain   Objective: Vitals:   04/05/17 1457 04/05/17 1834 04/05/17 2101 04/06/17 0551  BP: (!) 149/73  138/73 136/68  Pulse: (!) 111  (!) 113 (!) 109  Resp: _0 Temp: 98.5 F (36.9 C) 98.6 F (37 C) 98.7 F (37.1 C) 99.1 F (37.3 C)  TempSrc: Oral Oral Oral Oral  SpO2: 97%  100% 100%  Weight:      Height:        Intake/Output Summary (Last 24 hours) at 04/06/17 1151 Last data filed at 04/06/17 0900  Gross per 24 hour  Intake           1592.5 ml  Output                0 ml  Net           1592.5 ml   Filed Weights   04/03/17 0715  Weight: 77.1 kg (170 lb)    Examination: Constitutional: NAD Vitals:   04/05/17 1457 04/05/17 1834 04/05/17 2101 04/06/17 0551  BP: (!) 149/73  138/73 136/68  Pulse: (!) 111  (!) 113 (!) 109  Resp: _1 Temp: 98.5 F (36.9 C) 98.6  F (37 C) 98.7 F (37.1 C) 99.1 F (37.3 C)  TempSrc: Oral Oral Oral Oral  SpO2: 97%  100% 100%  Weight:      Height:       Eyes: PERRL, lids and conjunctivae normal Respiratory: clear to auscultation bilaterally, no wheezing, no crackles. Normal respiratory effort. Cardiovascular: Regular rate and rhythm, no murmurs / rubs / gallops.  Abdomen: no tenderness. Bowel sounds positive. .  Skin: no rashes, lesions, ulcers Neurologic: non focal   Data Reviewed: I have personally reviewed following labs and imaging studies  CBC:  Recent Labs Lab 04/01/17 1050  04/03/17 0708 04/03/17 2244 04/04/17 0809 04/05/17 0626 04/06/17 0607  WBC 16.7* 22.3* 19.4* 19.2* 16.4* 17.3*  NEUTROABS 12.9*  --   --   --   --   --   HGB 9.9* 9.5* 8.7* 8.7* 8.1* 7.9*  HCT 32.5* 29.9* 27.7* 27.7* 26.0* 25.0*  MCV 96.4 94.3 93.9 94.2 92.2 94.3  PLT 155 148* 116* 133* 114* 544*   Basic Metabolic Panel:  Recent Labs Lab 04/01/17 1050 04/03/17 0708 04/03/17 2057 04/04/17 0809 04/05/17 0626 04/06/17 0607  NA 138 138  --  137 140 139  K 4.6 4.4  --  4.2 4.2 4.2  CL  --  104  --  109 113* 111  CO2 21* 24  --  19* 20* 20*  GLUCOSE 114 73  --  104* 107* 83  BUN 22.7 22*  --  _0 CREATININE 1.8* 1.02* 0.72 0.77 0.81 0.83  CALCIUM 13.9 Repeated and Verified* 12.0*  --  10.4* 10.5* 10.4*   GFR: Estimated Creatinine Clearance: 68.9 mL/min (by C-G formula based on SCr of 0.83 mg/dL). Liver Function Tests:  Recent Labs Lab 04/01/17 1050 04/03/17 0708 04/06/17 0607  AST 24 47* 24  ALT _1 ALKPHOS 74 72 63  BILITOT 0.48 0.7 0.8  PROT 8.1 8.3* 6.3*  ALBUMIN 2.5* 2.9* 1.9*   No results for input(s): LIPASE, AMYLASE in the last 168 hours. No results for input(s): AMMONIA in the last 168 hours. Coagulation Profile:  Recent Labs Lab 04/03/17 0708  INR 1.15   Cardiac Enzymes: No results for input(s): CKTOTAL, CKMB, CKMBINDEX, TROPONINI in the last 168 hours. BNP (last 3 results) No results for input(s): PROBNP in the last 8760 hours. HbA1C: No results for input(s): HGBA1C in the last 72 hours. CBG:  Recent Labs Lab 04/03/17 0720  GLUCAP 63*   Lipid Profile: No results for input(s): CHOL, HDL, LDLCALC, TRIG, CHOLHDL, LDLDIRECT in the last 72 hours. Thyroid Function Tests: No results for input(s): TSH, T4TOTAL, FREET4, T3FREE, THYROIDAB in the last 72 hours. Anemia Panel: No results for input(s): VITAMINB12, FOLATE, FERRITIN, TIBC, IRON, RETICCTPCT in the last 72 hours. Urine analysis:    Component Value Date/Time   COLORURINE  YELLOW 04/03/2017 0910   APPEARANCEUR HAZY (A) 04/03/2017 0910   LABSPEC 1.017 04/03/2017 0910   PHURINE 5.0 04/03/2017 0910   GLUCOSEU NEGATIVE 04/03/2017 0910   HGBUR SMALL (A) 04/03/2017 0910   BILIRUBINUR NEGATIVE 04/03/2017 0910   BILIRUBINUR neg 03/09/2015 1333   KETONESUR NEGATIVE 04/03/2017 0910   PROTEINUR NEGATIVE 04/03/2017 0910   UROBILINOGEN 0.2 03/09/2015 1333   NITRITE NEGATIVE 04/03/2017 0910   LEUKOCYTESUR NEGATIVE 04/03/2017 0910   Sepsis Labs: Invalid input(s): PROCALCITONIN, LACTICIDVEN  Recent Results (from the past 240 hour(s))  TECHNOLOGIST REVIEW     Status: None   Collection Time: 04/01/17 10:50 AM  Result Value  Ref Range Status   Technologist Review Few Metas and Myelocytes present, rouleaux  Final  Blood culture (routine x 2)     Status: None (Preliminary result)   Collection Time: 04/03/17 10:10 AM  Result Value Ref Range Status   Specimen Description BLOOD LEFT WRIST  Final   Special Requests IN PEDIATRIC BOTTLE Blood Culture adequate volume  Final   Culture   Final    NO GROWTH 2 DAYS Performed at [facility], [address] [place], [place] [address]    Report Status PENDING  Incomplete  Blood culture (routine x 2)     Status: None (Preliminary result)   Collection Time: 04/03/17  8:20 PM  Result Value Ref Range Status   Specimen Description   Final    BLOOD LEFT ARM Performed at [facility], [address] [place], [place] [address]    Special Requests IN PEDIATRIC BOTTLE Blood Culture adequate volume  Final   Culture   Final    NO GROWTH 1 DAY Performed at [facility], [place] [address], [place], [place] [address]    Report Status PENDING  Incomplete  Culture, blood (routine x 2) Call MD if unable to obtain prior to antibiotics being given     Status: None (Preliminary result)   Collection Time: 04/03/17  8:20 PM  Result Value Ref Range Status   Specimen Description   Final    BLOOD LEFT HAND Performed at [facility], [address] [place], [place] [address]    Special Requests   Final    IN PEDIATRIC BOTTLE Blood Culture results may not be optimal due to an inadequate volume of blood received in culture bottles   Culture   Final    NO GROWTH 1 DAY Performed at [facility], [place] [address], [place], [place] [address]    Report Status PENDING  Incomplete      Radiology Studies: Dg Swallowing Func-speech Pathology  Result Date: 04/04/2017 Objective Swallowing Evaluation: Type of Study: Bedside Swallow Evaluation Patient Details Name: AZARAH DACY MRN: [id number] Date of Birth: 09-27-1951 Today's Date: 04/04/2017 Time: SLP Start Time (ACUTE ONLY): 1418-SLP Stop Time (ACUTE ONLY): 1433 SLP Time Calculation (min) (ACUTE ONLY): 15 min Past Medical History: Past Medical History: Diagnosis Date . Bruises easily   d/t being on COumadin . Cholelithiasis  . DDD (degenerative disc disease), lumbar  . Dehydration 03/11/2017 . Fatty liver  . GERD (gastroesophageal reflux disease)   takes Pantoprazole daily . Glaucoma   uses eye drops as instructed . H/O stem cell transplant ([place])   04-26-2006  AT [facility] . Herpes   takes Valtrex as needed . History of colon polyps  . Hypercalcemia of malignancy 03/11/2017 . Hypertension   takes Amlodipine and Diovan daily . Left radial fracture 01/21/2017 . Lung cancer ([place])   chest wall lesion aadn 6th rib . Multiple myeloma DX  2006  STATE IIA,  IgA  KAPPA  STABLE  PER DR MOHAMED--  S/P STEM CELL TRANSPLANT 2007 /  RECURRENCE 2008  CHEMO ENDED 03-30-2008/   NOW ON MAINTANANCE REVLIMID ) . Neuropathy associated with multiple myeloma ([facility])   POST CHEMOTHERAPY AND STEM CELL TRANSPLANT . Personal history of colon cancer   ASCENDING COLON--  S/P  RIGHT HEMICOLECTOMY WITH NEGATIVE NODES/    NO RECURRENCE . Type 2 diabetes mellitus ([place])  . Vocal cord nodule   resolved- see Dr. Deeann Saint notes in media tab 06/17/14 Past Surgical History:  Past Surgical History: Procedure Laterality Date .  ABDOMINAL HYSTERECTOMY  1983  w/  appendectomy and left salpingoophorectom . CATARACT EXTRACTION W/PHACO Left 01/20/2014  Procedure: CATARACT EXTRACTION PHACO AND INTRAOCULAR LENS PLACEMENT (IOC);  Surgeon: Adonis Brook, MD;  Location: [facility];  Service: Ophthalmology;  Laterality: Left; . CERVICAL CONE BIOPSY   . CHOLECYSTECTOMY N/A 02/28/2016  Procedure: LAPAROSCOPIC CHOLECYSTECTOMY;  Surgeon: Ralene Ok, MD;  Location: [facility];  Service: General;  Laterality: N/A; . CO2  ABLATION VULVA PERIANAL AND VAGINAL DYSPLATIC AREAS  02-15-2010 . COLONOSCOPY  10/07/2006  TSV:[id number] rectum/Status post right hemicolectomy. Normal residual colon . COLONOSCOPY  10/2009  RMR: diminutive RS polyp (hyperplastic).  . COLONOSCOPY N/A 12/15/2014  Procedure: COLONOSCOPY;  Surgeon: Daneil Dolin, MD;  [facility];  Service: Endoscopy;  Laterality: N/A;  130  . ESOPHAGOGASTRODUODENOSCOPY  04/02/2005  ZES:[id number] polyp with central erosion versus ulceration in the body of the stomach, resected and recovered Remainder of the gastric mucosa, D1, D2 appeared normal . HEMICOLECTOMY Right 04-27-2005  CARCINOMA   ASCENDING COLON . LESION REMOVAL N/A 05/06/2014  Procedure: LASER OF THE VAGINA;  Surgeon: Janie Morning, MD;  Location: [facility];  Service: Gynecology;  Laterality: N/A; . LIMBAL STEM CELL TRANSPLANT  04-26-2006    ([facility]) . VULVAR LESION REMOVAL N/A 05/06/2014  Procedure: LASER OF VULVAR LESION;  Surgeon: Janie Morning, MD;  Location: [facility];  Service: Gynecology;  Laterality: N/A; . VULVECTOMY N/A 05/06/2014  Procedure: WIDE LOCAL EXCISION LEFT LABIA MAJORA;  Surgeon: Janie Morning, MD;  Location: [facility];  Service: Gynecology;  Laterality: N/A; HPI:  66 y.o. 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  Pt found to have right UL pna, concerning for aspiration.  MD ordered MBS.   No Data Recorded Assessment / Plan / Recommendation CHL IP CLINICAL IMPRESSIONS 04/04/2017 Clinical Impression Functional oropharyngeal swallow ability without aspiration/ frank penetration of any consistency tested.  Pt has strong timely swallow without residuals.  Of note, pt did cough x1 during testing.  Upon esophageal sweep at end of testing, pt appeared with slow clearance distally without awareness.  Water consumption faciliated clearance. Pt may benefit from dedicated esophageal evaluation if MD indicates.  Using live video, educated pt and her sister [name] to findings.  Thanks for this referral. NO SLP follow up indicated.  SLP Visit Diagnosis Dysphagia, unspecified (R13.10) Attention and concentration deficit following -- Frontal lobe and executive function deficit following -- Impact on safety and function Mild aspiration risk   No flowsheet data found.  No flowsheet data found. CHL IP DIET RECOMMENDATION 04/04/2017 SLP Diet Recommendations Regular solids;Thin liquid Liquid Administration via Cup;Straw Medication Administration Whole meds with liquid Compensations Slow rate;Small sips/bites Postural Changes Remain semi-upright after after feeds/meals (Comment);Seated upright at 90 degrees   CHL IP OTHER RECOMMENDATIONS 04/04/2017 Recommended Consults Consider esophageal assessment Oral Care Recommendations Oral care BID Other Recommendations --   CHL IP FOLLOW UP RECOMMENDATIONS 04/04/2017 Follow up Recommendations None   No flowsheet data found.     CHL IP ORAL PHASE 04/04/2017 Oral Phase WFL Oral - Pudding Teaspoon -- Oral - Pudding Cup -- Oral - Honey Teaspoon -- Oral - Honey Cup -- Oral - Nectar Teaspoon -- Oral - Nectar Cup WFL Oral - Nectar  Straw -- Oral - Thin Teaspoon -- Oral - Thin Cup WFL Oral - Thin Straw WFL Oral - Puree WFL Oral - Mech Soft -- Oral -  Regular WFL Oral - Multi-Consistency -- Oral - Pill WFL Oral Phase - Comment --  CHL IP PHARYNGEAL PHASE 04/04/2017 Pharyngeal Phase WFL Pharyngeal- Pudding Teaspoon -- Pharyngeal -- Pharyngeal- Pudding Cup -- Pharyngeal -- Pharyngeal- Honey Teaspoon -- Pharyngeal -- Pharyngeal- Honey Cup -- Pharyngeal -- Pharyngeal- Nectar Teaspoon -- Pharyngeal -- Pharyngeal- Nectar Cup WFL Pharyngeal -- Pharyngeal- Nectar Straw -- Pharyngeal -- Pharyngeal- Thin Teaspoon -- Pharyngeal -- Pharyngeal- Thin Cup WFL;Penetration/Aspiration during swallow Pharyngeal Material enters airway, remains ABOVE vocal cords then ejected out Pharyngeal- Thin Straw WFL Pharyngeal -- Pharyngeal- Puree WFL Pharyngeal -- Pharyngeal- Mechanical Soft -- Pharyngeal -- Pharyngeal- Regular WFL Pharyngeal -- Pharyngeal- Multi-consistency -- Pharyngeal -- Pharyngeal- Pill WFL Pharyngeal -- Pharyngeal Comment --  CHL IP CERVICAL ESOPHAGEAL PHASE 04/04/2017 Cervical Esophageal Phase WFL Pudding Teaspoon -- Pudding Cup -- Honey Teaspoon -- Honey Cup -- Nectar Teaspoon -- Nectar Cup -- Nectar Straw -- Thin Teaspoon -- Thin Cup -- Thin Straw -- Puree -- Mechanical Soft -- Regular -- Multi-consistency -- Pill -- Cervical Esophageal Comment -- No flowsheet data found. Janett Labella [place], [facility] SLP [phone]                Scheduled Meds: . aspirin EC  81 mg Oral QPM  . cholecalciferol  1,000 Units Oral Daily  . dorzolamide-timolol  1 drop Left Eye BID  . enoxaparin (LOVENOX) injection  40 mg Subcutaneous Q24H  . feeding supplement (ENSURE ENLIVE)  237 mL Oral BID BM  . gabapentin  100 mg Oral TID  . latanoprost  1 drop Both Eyes QHS  . levofloxacin  500 mg Oral Daily  . loratadine  10 mg Oral Daily  . mirtazapine  30 mg Oral QHS  . multivitamin with minerals  1 tablet Oral Daily  . pantoprazole  40 mg Oral Daily  . potassium chloride SA  20 mEq Oral BID   Continuous Infusions:   Marzetta Board, MD, PhD [facility] Pager  [phone]  If 7PM-7AM, please contact night-coverage [email or web address] Password [email or web address] 04/06/2017, 11:51 AM

## 2017-04-07 LAB — CBC
HEMATOCRIT: 26 % — AB (ref 36.0–46.0)
HEMOGLOBIN: 8.1 g/dL — AB (ref 12.0–15.0)
MCH: 28.9 pg (ref 26.0–34.0)
MCHC: 31.2 g/dL (ref 30.0–36.0)
MCV: 92.9 fL (ref 78.0–100.0)
Platelets: 137 10*3/uL — ABNORMAL LOW (ref 150–400)
RBC: 2.8 MIL/uL — AB (ref 3.87–5.11)
RDW: 17.7 % — ABNORMAL HIGH (ref 11.5–15.5)
WBC: 16.7 10*3/uL — ABNORMAL HIGH (ref 4.0–10.5)

## 2017-04-07 MED ORDER — LEVOFLOXACIN 500 MG PO TABS: 500.0000 mg | ORAL_TABLET | Freq: Every day | ORAL | 0 refills | Status: DC

## 2017-04-07 MED ORDER — GUAIFENESIN-DM 100-10 MG/5ML PO SYRP: 5.0000 mL | ORAL_SOLUTION | ORAL | 0 refills | Status: DC | PRN

## 2017-04-07 NOTE — Progress Notes (Signed)
Discharge instructions explained to pt and her family. Prescription given to pt.She ia being discharged by wheelchair.

## 2017-04-07 NOTE — Care Management Note (Signed)
Case Management Note  Patient Details  Name: Darlene Howard MRN: [id number] Date of Birth: 1951-05-19  Subjective/Objective:   Lobar pneumonia, multiple myeloma, chemotherapy induced neuropathy                Action/Plan: Discharge Planning: AVS reviewed: NCM spoke to pt and sister at bedside. Pt states she lives in home with sister, [name] who assist her at home. Pt requesting wheelchair and 3n1 bedside commode. Contacted [facility] DME rep for equipment to be delivered to room prior to dc. Offered choice for HH/list provided. Pt requested [facility] for HH. Contacted Liaison with new referral.   PCP [name] MD   Expected Discharge Date:  04/07/17               Expected Discharge Plan:  [place]  In-House Referral:  Clinical Social Work  Discharge planning Services  CM Consult  Post Acute Care Choice:  Home Health Choice offered to:  Patient, Sibling  DME Arranged:  3-N-1, Youth worker wheelchair with seat cushion DME Agency:     HH Arranged:  PT, OT [place] Agency:  [facility]  Status of Service:  Completed, signed off  If discussed at [place] of Stay Meetings, dates discussed:    Additional Comments:  Erenest Rasher, RN 04/07/2017, 10:21 AM

## 2017-04-07 NOTE — Discharge Summary (Signed)
Physician Discharge Summary  Darlene Howard XNT:[id number] DOB: [birth year] DOA: 04/03/2017  PCP: [name], MD  Admit date: 04/03/2017 Discharge date: 04/07/2017  Admitted From: Home Disposition: Home  Recommendations for Outpatient Follow-up:  1. Follow up with Dr. Earlie Server as scheduled 2. Continue Levaquin for 5 additional days  Home Health: Physical therapy, Occupational Therapy Equipment/Devices: Walker, 3 in 1  Discharge Condition: Stable CODE STATUS: Full Diet recommendation: Regular  HPI: Per Dr. Wynelle Cleveland, Darlene Howard is a 66 y.o. 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 Brother at bedside helps give history.   She has been weak for about 2-3 wks now and has been using a walker at home and wheelchair outside. She was scheduled to receive a fluid bolus in the cancer center today but was not able to walk today and was brought in to the ER. Work up reveals a RUL pneumonia. She has had a cough for 3-4 wks now and was given a Z pak over 2 wks ago by her PCP but cough has not resolved. She has mild congestion. No fever or chills. No chest pain. No dyspnea at rest or with exertion.  She has a mild frontal headache today, feels like sinus issues. No runny nose, watery eyes, sore throat, PND or ear ache.   ED Course: HR in 130s, temp 99.2, pulse ox in 90s on RA WBC 22.3 LA 2.0 CXR >> RUL infltrate Influenza negative  Hospital Course: Discharge Diagnoses:  Principal Problem:   Lobar pneumonia ([place]) Active Problems:   Back pain with left-sided radiculopathy   Fatty liver   Multiple myeloma not having achieved remission (HCC)   Hypercalcemia of malignancy   Protein calorie malnutrition (HCC)   Chemotherapy-induced neuropathy (HCC)   Lobar  pneumonia in immune compromised patient -Patient was admitted to the hospital and started on vancomycin and cefepime on admission, was on broad-spectrum antibiotics for 72 hours, patient is afebrile, clinically improved, her regimen was narrowed to Levaquin and monitored for another day, she remained stable, with stable respiratory status, she is afebrile, feeling back to baseline.  She will be discharged home in stable condition with home health PT/OT.  She is to continue Levaquin for 5 additional days. Acute on chronic leukocytosis -due to above, overall stable  Tachycardia, leukocytosis, mild lactic acidosis >>sepsis -Sepsis physiology resolved, she is afebrile Multiple myeloma not having achieved remission -received Velcade and Decadron on Monday per Dr [name], outpatient management, she is supposed to see Dr. Earlie Server tomorrow Generalized weakness -improving, home health PT/OT Back pain and LE neuropathy -Percocet and Neurontin Fatty liver -noted on CT Hypercalcemia of malignancy -received Zometa recently and calcitonin, improved Protein calorie malnutrition  Chemotherapy-induced neuropathy -cont Neuontin Glaucoma -cont eye drops h/o HTN -she has been hypotensive and home, she has been normotensive here off of her Diovan, recommend to discontinue this on discharge and follow-up with PCP in 1 week for blood pressure monitoring.  Discharge Instructions   Allergies as of 04/07/2017      Reactions   Codeine Nausea And Vomiting   Tramadol Nausea And Vomiting      Medication List    STOP taking these medications   valsartan-hydrochlorothiazide 320-25 MG tablet Commonly known as:  DIOVAN-HCT     TAKE these medications   acetaminophen 500 MG  tablet Commonly known as:  TYLENOL Take 500 mg by mouth every 6 (six) hours as needed for mild pain, moderate pain, fever or headache.   aspirin EC 81 MG tablet Take 81 mg by mouth every evening.   cholecalciferol 1000 units tablet Commonly  known as:  VITAMIN D Take 1,000 Units by mouth daily.   dexamethasone 4 MG tablet Commonly known as:  DECADRON TAKE 10 TABS ONCE WEEKLY  WHILE UNDERGOING VELCADE  INJECTIONS. What changed:  See the new instructions.   dorzolamide-timolol 22.3-6.8 MG/ML ophthalmic solution Commonly known as:  COSOPT Place 1 drop into the left eye 2 (two) times daily.   gabapentin 100 MG capsule Commonly known as:  NEURONTIN TAKE 1 CAPSULE BY MOUTH 3  TIMES DAILY   guaiFENesin-dextromethorphan 100-10 MG/5ML syrup Commonly known as:  ROBITUSSIN DM Take 5 mLs by mouth every 4 (four) hours as needed for cough.   levofloxacin 500 MG tablet Commonly known as:  LEVAQUIN Take 1 tablet (500 mg total) by mouth daily.   loperamide 2 MG capsule Commonly known as:  IMODIUM Take 2-4 mg by mouth every 2 (two) hours as needed for diarrhea or loose stools.   loratadine 10 MG tablet Commonly known as:  CLARITIN TAKE ONE TABLET BY MOUTH ONCE DAILY.   LUMIGAN 0.01 % Soln Generic drug:  bimatoprost Place 1 drop into both eyes at bedtime.   mirtazapine 30 MG tablet Commonly known as:  REMERON Take 1 tablet (30 mg total) by mouth at bedtime.   multivitamin with minerals Tabs tablet Take 1 tablet by mouth daily.   OSCAL 500/200 D-3 500-200 MG-UNIT tablet Generic drug:  calcium-vitamin D Take 1 tablet by mouth 3 (three) times daily.   oxyCODONE-acetaminophen 5-325 MG tablet Commonly known as:  PERCOCET/ROXICET Take 1 tablet by mouth every 4 (four) hours as needed for severe pain. May take one tablet every 4-6 hours as needed for pain   pantoprazole 40 MG tablet Commonly known as:  PROTONIX TAKE 1 TABLET BY MOUTH  DAILY FOR ACID REFLUX   potassium chloride SA 20 MEQ tablet Commonly known as:  K-DUR,KLOR-CON TAKE 1 TABLET BY MOUTH  TWICE A DAY   PREMARIN vaginal cream Generic drug:  conjugated estrogens PLACE 1 APPLICATORFUL VAGINALLY TWICE A WEEK.   temazepam 15 MG capsule Commonly known as:   RESTORIL TAKE 1 CAPSULE AT BEDTIME AS NEEDED FOR SLEEP.   valACYclovir 1000 MG tablet Commonly known as:  VALTREX TAKE 1 TABLET BY MOUTH  DAILY AS NEEDED What changed:  See the new instructions.            Durable Medical Equipment        Start     Ordered   04/07/17 [phone]  For home use only DME lightweight manual wheelchair with seat cushion  Once    Comments:  Patient suffers from multiple myeloma, chemo-therapy induced neuropathy which impairs their ability to perform daily activities like standing, walking in the home.  A rolling walker, cane will not resolve  issue with performing activities of daily living. A wheelchair will allow patient to safely perform daily activities. Patient is not able to propel themselves in the home using a standard weight wheelchair due too pain, weakness. Patient can self propel in the lightweight wheelchair.  Accessories: elevating leg rests (ELRs), wheel locks, extensions and anti-tippers, cushion seat   04/07/17 0924   04/07/17 0812  For home use only DME 3 n 1  Once     04/07/17  0811   04/07/17 0812  For home use only DME Walker  Once    Question:  Patient needs a walker to treat with the following condition  Answer:  Muscular deconditioning   04/07/17 3382     Follow-up Information    [name], MD. Schedule an appointment as soon as possible for a visit in 2 week(s).   Specialty:  Family Medicine Contact information: [address] [place] [address] [facility] up.   Why:  Home Health Physical Therapy and Occupational Therapy  Contact information: [place] [address] [phone]          Allergies  Allergen Reactions  . Codeine Nausea And Vomiting  . Tramadol Nausea And Vomiting    Consultations:  None   Procedures/Studies:  Dg Chest 2 View  Result Date: 04/03/2017 CLINICAL DATA:  Awakened today with increase cough an  uncontrollable shaking. History of multiple myeloma on chemotherapy. EXAM: CHEST  2 VIEW COMPARISON:  Chest x-ray of April 25th 27 teen FINDINGS: The lungs are mildly hypoinflated. There is confluent alveolar opacity in the right perihilar region more conspicuous than on the previous study. This appears to reflect new infiltrate superimposed upon a posterior chest wall soft tissue mass. There are lateral left rib deformities which have appeared since the previous study. The cardiac silhouette is enlarged. The pulmonary vascularity is not engorged. There degenerative changes of both shoulders. The thoracic vertebral bodies are preserved in height. IMPRESSION: New right upper lobe pneumonia extending to the hilar region. Followup PA and lateral chest X-ray is recommended in 3-4 weeks following trial of antibiotic therapy to ensure resolution and exclude underlying malignancy. Persistent posterior right mid thoracic pleural based mass consistent with myelomatous involvement. New rib deformity on the left laterally involving the fifth and sixth ribs consistent with myelomatous involvement. Electronically Signed   By: David  Martinique M.D.   On: 04/03/2017 09:55   Dg Bone Survey Met  Result Date: 03/15/2017 CLINICAL DATA:  History of multiple myeloma. EXAM: METASTATIC BONE SURVEY COMPARISON:  05/01/2016. FINDINGS: Imaging of the axial and appendicular spine obtained. Severe increase in multiple lytic lesions noted throughout the skull. Small lytic lesions are most likely present throughout the spine. Lytic lesions are noted throughout the bony structures of both lower extremities and upper extremities. ORIF left radius. Callus formation is noted. Right chest density is noted. This may represent previously identified right rib mass/ plasmocytoma. Right upper lobe infiltrate and/or mass lesion cannot be excluded. PA lateral chest x-ray suggested for further evaluation . IMPRESSION: 1. Progressive widespread changes of  multiple myeloma noted throughout the axial and appendicular skeleton. 2. Right chest density noted. This may represent previously identified right rib mass/ plasmocytoma. Right upper lobe infiltrate and/or mass lesion cannot be excluded. PA and lateral chest x-ray suggested to further evaluation Electronically Signed   By: Marcello Moores  Register   On: 03/15/2017 08:45   Dg Swallowing Func-speech Pathology  Result Date: 04/04/2017 Objective Swallowing Evaluation: Type of Study: Bedside Swallow Evaluation Patient Details Name: JAZMEN LINDENBAUM MRN: [id number] Date of Birth: 11-24-1951 Today's Date: 04/04/2017 Time: SLP Start Time (ACUTE ONLY): 1418-SLP Stop Time (ACUTE ONLY): 1433 SLP Time Calculation (min) (ACUTE ONLY): 15 min Past Medical History: Past Medical History: Diagnosis Date . Bruises easily   d/t being on COumadin . Cholelithiasis  . DDD (degenerative disc disease), lumbar  . Dehydration 03/11/2017 . Fatty  liver  . GERD (gastroesophageal reflux disease)   takes Pantoprazole daily . Glaucoma   uses eye drops as instructed . H/O stem cell transplant ([place])   04-26-2006  AT [facility] . Herpes   takes Valtrex as needed . History of colon polyps  . Hypercalcemia of malignancy 03/11/2017 . Hypertension   takes Amlodipine and Diovan daily . Left radial fracture 01/21/2017 . Lung cancer ([place])   chest wall lesion aadn 6th rib . Multiple myeloma DX  2006  STATE IIA,  IgA  KAPPA  STABLE  PER DR MOHAMED--  S/P STEM CELL TRANSPLANT 2007 /  RECURRENCE 2008  CHEMO ENDED 03-30-2008/   NOW ON MAINTANANCE REVLIMID ) . Neuropathy associated with multiple myeloma (HCC)   POST CHEMOTHERAPY AND STEM CELL TRANSPLANT . Personal history of colon cancer   ASCENDING COLON--  S/P  RIGHT HEMICOLECTOMY WITH NEGATIVE NODES/    NO RECURRENCE . Type 2 diabetes mellitus ([place])  . Vocal cord nodule   resolved- see Dr. Deeann Saint notes in media tab 06/17/14 Past Surgical History: Past Surgical History: Procedure Laterality Date . ABDOMINAL HYSTERECTOMY  1983  w/   appendectomy and left salpingoophorectom . CATARACT EXTRACTION W/PHACO Left 01/20/2014  Procedure: CATARACT EXTRACTION PHACO AND INTRAOCULAR LENS PLACEMENT (IOC);  Surgeon: Adonis Brook, MD;  Location: [facility];  Service: Ophthalmology;  Laterality: Left; . CERVICAL CONE BIOPSY   . CHOLECYSTECTOMY N/A 02/28/2016  Procedure: LAPAROSCOPIC CHOLECYSTECTOMY;  Surgeon: Ralene Ok, MD;  Location: [facility];  Service: General;  Laterality: N/A; . CO2  ABLATION VULVA PERIANAL AND VAGINAL DYSPLATIC AREAS  02-15-2010 . COLONOSCOPY  10/07/2006  ONG:EXBMWU rectum/Status post right hemicolectomy. Normal residual colon . COLONOSCOPY  10/2009  RMR: diminutive RS polyp (hyperplastic).  . COLONOSCOPY N/A 12/15/2014  Procedure: COLONOSCOPY;  Surgeon: Daneil Dolin, MD;  [facility];  Service: Endoscopy;  Laterality: N/A;  130  . ESOPHAGOGASTRODUODENOSCOPY  04/02/2005  XLK:GMWNUU/[id number] polyp with central erosion versus ulceration in the body of the stomach, resected and recovered Remainder of the gastric mucosa, D1, D2 appeared normal . HEMICOLECTOMY Right 04-27-2005  CARCINOMA   ASCENDING COLON . LESION REMOVAL N/A 05/06/2014  Procedure: LASER OF THE VAGINA;  Surgeon: Janie Morning, MD;  Location: [facility];  Service: Gynecology;  Laterality: N/A; . LIMBAL STEM CELL TRANSPLANT  04-26-2006    ([facility]) . VULVAR LESION REMOVAL N/A 05/06/2014  Procedure: LASER OF VULVAR LESION;  Surgeon: Janie Morning, MD;  Location: [facility];  Service: Gynecology;  Laterality: N/A; . VULVECTOMY N/A 05/06/2014  Procedure: WIDE LOCAL EXCISION LEFT LABIA MAJORA;  Surgeon: Janie Morning, MD;  Location: [facility];  Service: Gynecology;  Laterality: N/A; HPI:  66 y.o. 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  Pt found to have right UL pna, concerning for aspiration.  MD ordered MBS.   No Data Recorded Assessment / Plan / Recommendation CHL IP CLINICAL IMPRESSIONS 04/04/2017 Clinical Impression Functional oropharyngeal swallow ability without aspiration/ frank penetration of any consistency tested.  Pt has strong timely swallow without residuals.  Of note, pt did cough x1 during testing.  Upon esophageal sweep at end of testing, pt appeared with slow clearance distally without awareness.  Water consumption faciliated clearance. Pt may benefit from dedicated esophageal evaluation if MD indicates.  Using  live video, educated pt and her sister [name] to findings.  Thanks for this referral. NO SLP follow up indicated.  SLP Visit Diagnosis Dysphagia, unspecified (R13.10) Attention and concentration deficit following -- Frontal lobe and executive function deficit following -- Impact on safety and function Mild aspiration risk   No flowsheet data found.  No flowsheet data found. CHL IP DIET RECOMMENDATION 04/04/2017 SLP Diet Recommendations Regular solids;Thin liquid Liquid Administration via Cup;Straw Medication Administration Whole meds with liquid Compensations Slow rate;Small sips/bites Postural Changes Remain semi-upright after after feeds/meals (Comment);Seated upright at 90 degrees   CHL IP OTHER RECOMMENDATIONS 04/04/2017 Recommended Consults Consider esophageal assessment Oral Care Recommendations Oral care BID Other Recommendations --   CHL IP FOLLOW UP RECOMMENDATIONS 04/04/2017 Follow up Recommendations None   No flowsheet data found.     CHL IP ORAL PHASE 04/04/2017 Oral Phase WFL Oral - Pudding Teaspoon -- Oral - Pudding Cup -- Oral - Honey Teaspoon -- Oral - Honey Cup -- Oral - Nectar Teaspoon -- Oral - Nectar Cup WFL Oral - Nectar Straw -- Oral - Thin Teaspoon -- Oral - Thin Cup WFL Oral - Thin Straw WFL Oral - Puree WFL Oral - Mech Soft -- Oral - Regular WFL Oral - Multi-Consistency --  Oral - Pill WFL Oral Phase - Comment --  CHL IP PHARYNGEAL PHASE 04/04/2017 Pharyngeal Phase WFL Pharyngeal- Pudding Teaspoon -- Pharyngeal -- Pharyngeal- Pudding Cup -- Pharyngeal -- Pharyngeal- Honey Teaspoon -- Pharyngeal -- Pharyngeal- Honey Cup -- Pharyngeal -- Pharyngeal- Nectar Teaspoon -- Pharyngeal -- Pharyngeal- Nectar Cup WFL Pharyngeal -- Pharyngeal- Nectar Straw -- Pharyngeal -- Pharyngeal- Thin Teaspoon -- Pharyngeal -- Pharyngeal- Thin Cup WFL;Penetration/Aspiration during swallow Pharyngeal Material enters airway, remains ABOVE vocal cords then ejected out Pharyngeal- Thin Straw WFL Pharyngeal -- Pharyngeal- Puree WFL Pharyngeal -- Pharyngeal- Mechanical Soft -- Pharyngeal -- Pharyngeal- Regular WFL Pharyngeal -- Pharyngeal- Multi-consistency -- Pharyngeal -- Pharyngeal- Pill WFL Pharyngeal -- Pharyngeal Comment --  CHL IP CERVICAL ESOPHAGEAL PHASE 04/04/2017 Cervical Esophageal Phase WFL Pudding Teaspoon -- Pudding Cup -- Honey Teaspoon -- Honey Cup -- Nectar Teaspoon -- Nectar Cup -- Nectar Straw -- Thin Teaspoon -- Thin Cup -- Thin Straw -- Puree -- Mechanical Soft -- Regular -- Multi-consistency -- Pill -- Cervical Esophageal Comment -- No flowsheet data found. Janett Labella [place], [facility] SLP [phone]                 Subjective: - no chest pain, shortness of breath, no abdominal pain, nausea or vomiting.   Discharge Exam: Vitals:   04/06/17 2150 04/07/17 0652  BP: 122/64 134/67  Pulse: (!) 117 (!) 110  Resp: 18 18  Temp: 98.8 F (37.1 C) 99.1 F (37.3 C)   Vitals:   04/06/17 0551 04/06/17 1425 04/06/17 2150 04/07/17 0652  BP: 136/68 119/65 122/64 134/67  Pulse: (!) 109 77 (!) 117 (!) 110  Resp: _0 Temp: 99.1 F (37.3 C) 98.7 F (37.1 C) 98.8 F (37.1 C) 99.1 F (37.3 C)  TempSrc: Oral Oral Oral Oral  SpO2: 100% 99% 99% 100%  Weight:      Height:        General: Pt is alert, awake, not in acute distress Cardiovascular: RRR, S1/S2 +, no rubs,  no gallops Respiratory: CTA bilaterally, no wheezing, no rhonchi Abdominal: Soft, NT, ND, bowel sounds + Extremities: no edema, no cyanosis    The results of significant diagnostics from this hospitalization (including imaging, microbiology, ancillary and  laboratory) are listed below for reference.     Microbiology: Recent Results (from the past 240 hour(s))  TECHNOLOGIST REVIEW     Status: None   Collection Time: 04/01/17 10:50 AM  Result Value Ref Range Status   Technologist Review Few Metas and Myelocytes present, rouleaux  Final  Blood culture (routine x 2)     Status: None (Preliminary result)   Collection Time: 04/03/17 10:10 AM  Result Value Ref Range Status   Specimen Description BLOOD LEFT WRIST  Final   Special Requests IN PEDIATRIC BOTTLE Blood Culture adequate volume  Final   Culture   Final    NO GROWTH 3 DAYS Performed at [facility], [address] [place], [place] [address]    Report Status PENDING  Incomplete  Blood culture (routine x 2)     Status: None (Preliminary result)   Collection Time: 04/03/17  8:20 PM  Result Value Ref Range Status   Specimen Description   Final    BLOOD LEFT ARM Performed at [facility], [address] [place], [place] [address]    Special Requests IN PEDIATRIC BOTTLE Blood Culture adequate volume  Final   Culture   Final    NO GROWTH 2 DAYS Performed at [facility], [place] [address] [place], [place] [address]    Report Status PENDING  Incomplete  Culture, blood (routine x 2) Call MD if unable to obtain prior to antibiotics being given     Status: None (Preliminary result)   Collection Time: 04/03/17  8:20 PM  Result Value Ref Range Status   Specimen Description   Final    BLOOD LEFT HAND Performed at [facility], [address], [place], [place] [address]    Special Requests   Final    IN PEDIATRIC BOTTLE Blood Culture results may not be optimal due to an inadequate volume of blood received in  culture bottles   Culture   Final    NO GROWTH 2 DAYS Performed at [facility], [place] [address] [place], [place] [address]    Report Status PENDING  Incomplete     Labs: BNP (last 3 results) No results for input(s): BNP in the last 8760 hours. Basic Metabolic Panel:  Recent Labs Lab 04/01/17 1050 04/03/17 0708 04/03/17 2057 04/04/17 0809 04/05/17 0626 04/06/17 0607  NA 138 138  --  137 140 139  K 4.6 4.4  --  4.2 4.2 4.2  CL  --  104  --  109 113* 111  CO2 21* 24  --  19* 20* 20*  GLUCOSE 114 73  --  104* 107* 83  BUN 22.7 22*  --  _0 CREATININE 1.8* 1.02* 0.72 0.77 0.81 0.83  CALCIUM 13.9 Repeated and Verified* 12.0*  --  10.4* 10.5* 10.4*   Liver Function Tests:  Recent Labs Lab 04/01/17 1050 04/03/17 0708 04/06/17 0607  AST 24 47* 24  ALT _1 ALKPHOS 74 72 63  BILITOT 0.48 0.7 0.8  PROT 8.1 8.3* 6.3*  ALBUMIN 2.5* 2.9* 1.9*   No results for input(s): LIPASE, AMYLASE in the last 168 hours. No results for input(s): AMMONIA in the last 168 hours. CBC:  Recent Labs Lab 04/01/17 1050  04/03/17 2244 04/04/17 0809 04/05/17 0626 04/06/17 0607 04/07/17 0555  WBC 16.7*  < > 19.4* 19.2* 16.4* 17.3* 16.7*  NEUTROABS 12.9*  --   --   --   --   --   --  HGB 9.9*  < > 8.7* 8.7* 8.1* 7.9* 8.1*  HCT 32.5*  < > 27.7* 27.7* 26.0* 25.0* 26.0*  MCV 96.4  < > 93.9 94.2 92.2 94.3 92.9  PLT 155  < > 116* 133* 114* 121* 137*  < > = values in this interval not displayed. Cardiac Enzymes: No results for input(s): CKTOTAL, CKMB, CKMBINDEX, TROPONINI in the last 168 hours. BNP: Invalid input(s): POCBNP CBG:  Recent Labs Lab 04/03/17 0720  GLUCAP 63*   D-Dimer No results for input(s): DDIMER in the last 72 hours. Hgb A1c No results for input(s): HGBA1C in the last 72 hours. Lipid Profile No results for input(s): CHOL, HDL, LDLCALC, TRIG, CHOLHDL, LDLDIRECT in the last 72 hours. Thyroid function studies No results for input(s): TSH, T4TOTAL,  T3FREE, THYROIDAB in the last 72 hours.  Invalid input(s): FREET3 Anemia work up No results for input(s): VITAMINB12, FOLATE, FERRITIN, TIBC, IRON, RETICCTPCT in the last 72 hours. Urinalysis    Component Value Date/Time   COLORURINE YELLOW 04/03/2017 0910   APPEARANCEUR HAZY (A) 04/03/2017 0910   LABSPEC 1.017 04/03/2017 0910   PHURINE 5.0 04/03/2017 0910   GLUCOSEU NEGATIVE 04/03/2017 0910   HGBUR SMALL (A) 04/03/2017 0910   BILIRUBINUR NEGATIVE 04/03/2017 0910   BILIRUBINUR neg 03/09/2015 1333   KETONESUR NEGATIVE 04/03/2017 0910   PROTEINUR NEGATIVE 04/03/2017 0910   UROBILINOGEN 0.2 03/09/2015 1333   NITRITE NEGATIVE 04/03/2017 0910   LEUKOCYTESUR NEGATIVE 04/03/2017 0910   Sepsis Labs Invalid input(s): PROCALCITONIN,  WBC,  LACTICIDVEN Microbiology Recent Results (from the past 240 hour(s))  TECHNOLOGIST REVIEW     Status: None   Collection Time: 04/01/17 10:50 AM  Result Value Ref Range Status   Technologist Review Few Metas and Myelocytes present, rouleaux  Final  Blood culture (routine x 2)     Status: None (Preliminary result)   Collection Time: 04/03/17 10:10 AM  Result Value Ref Range Status   Specimen Description BLOOD LEFT WRIST  Final   Special Requests IN PEDIATRIC BOTTLE Blood Culture adequate volume  Final   Culture   Final    NO GROWTH 3 DAYS Performed at [facility], [address], [place], [place] [address]    Report Status PENDING  Incomplete  Blood culture (routine x 2)     Status: None (Preliminary result)   Collection Time: 04/03/17  8:20 PM  Result Value Ref Range Status   Specimen Description   Final    BLOOD LEFT ARM Performed at [facility], [address], [place], [place] [address]    Special Requests IN PEDIATRIC BOTTLE Blood Culture adequate volume  Final   Culture   Final    NO GROWTH 2 DAYS Performed at [facility], [place] [address] [place], [place] [address]    Report Status PENDING  Incomplete  Culture, blood  (routine x 2) Call MD if unable to obtain prior to antibiotics being given     Status: None (Preliminary result)   Collection Time: 04/03/17  8:20 PM  Result Value Ref Range Status   Specimen Description   Final    BLOOD LEFT HAND Performed at [facility], [address] [place], [place] [address]    Special Requests   Final    IN PEDIATRIC BOTTLE Blood Culture results may not be optimal due to an inadequate volume of blood received in culture bottles   Culture   Final    NO GROWTH 2 DAYS Performed at [facility], [address], [place], [place] [address]    Report Status PENDING  Incomplete     Time coordinating discharge: 35 minutes  SIGNED:  Marzetta Board, MD  [facility] 04/07/2017, 11:19 AM Pager [phone]  If 7PM-7AM, please contact night-coverage [email or web address] Password [email or web address]

## 2017-04-07 NOTE — Care Management Important Message (Signed)
Important Message  Patient Details  Name: Darlene Howard MRN: [id number] Date of Birth: 03-28-1951   Medicare Important Message Given:  Yes    Erenest Rasher, RN 04/07/2017, 9:26 AM

## 2017-04-07 NOTE — Progress Notes (Signed)
Progress Note for the Evaluation of Need for a Wheelchair Patient Name: ____[name]_________________________________        DOB:_Aug 10, 1952_____________________________ Diagnosis Codes: __ICD-9-CM: 203.00 ICD-10-CM: C90.00, ICD-9-CM: 357.6, E933.1 ICD-10-CM: G62.0, T45.1X5A_________________________________              Height: 5'6"       Weight: 170 lbs_     Lightweight Wheelchair   Patient suffers from __multiple myeloma, chemo-induced neuropathy__ which impairs their ability to perform daily activities like standing, walking, dressing in the home. A ___rolling walker , cane____________________ will not resolve issue with performing activities of daily living. A wheelchair will allow patient to safely perform daily activities. Patient is not able to propel themselves in the home using a standard weight wheelchair due to pain, weakness   Patient can self propel in the lightweight wheelchair.     Clinician Signature/Credentials: Jonnie Finner RN CCM  Clinician Printed Name: Jonnie Finner RN CCM 04/07/2017

## 2017-04-08 ENCOUNTER — Other Ambulatory Visit: Payer: Medicare Other

## 2017-04-08 ENCOUNTER — Encounter: Payer: Medicare Other | Admitting: Nutrition

## 2017-04-08 ENCOUNTER — Ambulatory Visit: Payer: Medicare Other | Admitting: Internal Medicine

## 2017-04-08 ENCOUNTER — Inpatient Hospital Stay: Payer: Medicare Other

## 2017-04-09 DIAGNOSIS — J181 Lobar pneumonia, unspecified organism: Secondary | ICD-10-CM | POA: Diagnosis not present

## 2017-04-09 DIAGNOSIS — C9000 Multiple myeloma not having achieved remission: Secondary | ICD-10-CM | POA: Diagnosis not present

## 2017-04-09 DIAGNOSIS — G620 Drug-induced polyneuropathy: Secondary | ICD-10-CM | POA: Diagnosis not present

## 2017-04-09 DIAGNOSIS — H409 Unspecified glaucoma: Secondary | ICD-10-CM | POA: Diagnosis not present

## 2017-04-09 DIAGNOSIS — M5410 Radiculopathy, site unspecified: Secondary | ICD-10-CM | POA: Diagnosis not present

## 2017-04-09 DIAGNOSIS — Z9484 Stem cells transplant status: Secondary | ICD-10-CM | POA: Diagnosis not present

## 2017-04-09 DIAGNOSIS — Z85118 Personal history of other malignant neoplasm of bronchus and lung: Secondary | ICD-10-CM | POA: Diagnosis not present

## 2017-04-09 DIAGNOSIS — E119 Type 2 diabetes mellitus without complications: Secondary | ICD-10-CM | POA: Diagnosis not present

## 2017-04-09 DIAGNOSIS — K219 Gastro-esophageal reflux disease without esophagitis: Secondary | ICD-10-CM | POA: Diagnosis not present

## 2017-04-09 DIAGNOSIS — E43 Unspecified severe protein-calorie malnutrition: Secondary | ICD-10-CM | POA: Diagnosis not present

## 2017-04-09 DIAGNOSIS — Z79891 Long term (current) use of opiate analgesic: Secondary | ICD-10-CM | POA: Diagnosis not present

## 2017-04-09 DIAGNOSIS — Z85038 Personal history of other malignant neoplasm of large intestine: Secondary | ICD-10-CM | POA: Diagnosis not present

## 2017-04-09 DIAGNOSIS — K760 Fatty (change of) liver, not elsewhere classified: Secondary | ICD-10-CM | POA: Diagnosis not present

## 2017-04-09 DIAGNOSIS — Z7982 Long term (current) use of aspirin: Secondary | ICD-10-CM | POA: Diagnosis not present

## 2017-04-09 DIAGNOSIS — T451X5D Adverse effect of antineoplastic and immunosuppressive drugs, subsequent encounter: Secondary | ICD-10-CM | POA: Diagnosis not present

## 2017-04-09 DIAGNOSIS — I10 Essential (primary) hypertension: Secondary | ICD-10-CM | POA: Diagnosis not present

## 2017-04-09 DIAGNOSIS — G8929 Other chronic pain: Secondary | ICD-10-CM | POA: Diagnosis not present

## 2017-04-09 LAB — CULTURE, BLOOD (ROUTINE X 2)
Culture: NO GROWTH
Culture: NO GROWTH
Culture: NO GROWTH
Special Requests: ADEQUATE
Special Requests: ADEQUATE

## 2017-04-11 DIAGNOSIS — J181 Lobar pneumonia, unspecified organism: Secondary | ICD-10-CM | POA: Diagnosis not present

## 2017-04-11 DIAGNOSIS — C9000 Multiple myeloma not having achieved remission: Secondary | ICD-10-CM | POA: Diagnosis not present

## 2017-04-11 DIAGNOSIS — G620 Drug-induced polyneuropathy: Secondary | ICD-10-CM | POA: Diagnosis not present

## 2017-04-11 DIAGNOSIS — E119 Type 2 diabetes mellitus without complications: Secondary | ICD-10-CM | POA: Diagnosis not present

## 2017-04-11 DIAGNOSIS — T451X5D Adverse effect of antineoplastic and immunosuppressive drugs, subsequent encounter: Secondary | ICD-10-CM | POA: Diagnosis not present

## 2017-04-12 ENCOUNTER — Other Ambulatory Visit: Payer: Self-pay

## 2017-04-12 ENCOUNTER — Emergency Department (HOSPITAL_COMMUNITY): Payer: Medicare Other

## 2017-04-12 ENCOUNTER — Telehealth: Payer: Self-pay | Admitting: Family Medicine

## 2017-04-12 ENCOUNTER — Encounter (HOSPITAL_COMMUNITY): Payer: Self-pay

## 2017-04-12 ENCOUNTER — Telehealth: Payer: Self-pay

## 2017-04-12 ENCOUNTER — Inpatient Hospital Stay (HOSPITAL_COMMUNITY)
Admission: EM | Admit: 2017-04-12 | Discharge: 2017-04-15 | DRG: 194 | Disposition: A | Discharge status: Home Health | Payer: Medicare Other | Attending: Internal Medicine | Admitting: Internal Medicine

## 2017-04-12 DIAGNOSIS — Y95 Nosocomial condition: Secondary | ICD-10-CM | POA: Diagnosis present

## 2017-04-12 DIAGNOSIS — D638 Anemia in other chronic diseases classified elsewhere: Secondary | ICD-10-CM | POA: Diagnosis present

## 2017-04-12 DIAGNOSIS — I10 Essential (primary) hypertension: Secondary | ICD-10-CM | POA: Diagnosis not present

## 2017-04-12 DIAGNOSIS — E119 Type 2 diabetes mellitus without complications: Secondary | ICD-10-CM | POA: Diagnosis present

## 2017-04-12 DIAGNOSIS — R509 Fever, unspecified: Secondary | ICD-10-CM | POA: Diagnosis not present

## 2017-04-12 DIAGNOSIS — J181 Lobar pneumonia, unspecified organism: Secondary | ICD-10-CM | POA: Diagnosis present

## 2017-04-12 DIAGNOSIS — J189 Pneumonia, unspecified organism: Principal | ICD-10-CM | POA: Diagnosis present

## 2017-04-12 DIAGNOSIS — R000 Tachycardia, unspecified: Secondary | ICD-10-CM | POA: Diagnosis present

## 2017-04-12 DIAGNOSIS — M545 Low back pain: Secondary | ICD-10-CM | POA: Diagnosis present

## 2017-04-12 DIAGNOSIS — G620 Drug-induced polyneuropathy: Secondary | ICD-10-CM | POA: Diagnosis present

## 2017-04-12 DIAGNOSIS — K219 Gastro-esophageal reflux disease without esophagitis: Secondary | ICD-10-CM | POA: Diagnosis present

## 2017-04-12 DIAGNOSIS — C9000 Multiple myeloma not having achieved remission: Secondary | ICD-10-CM | POA: Diagnosis present

## 2017-04-12 DIAGNOSIS — T451X5A Adverse effect of antineoplastic and immunosuppressive drugs, initial encounter: Secondary | ICD-10-CM | POA: Diagnosis present

## 2017-04-12 DIAGNOSIS — Z7952 Long term (current) use of systemic steroids: Secondary | ICD-10-CM

## 2017-04-12 DIAGNOSIS — N179 Acute kidney failure, unspecified: Secondary | ICD-10-CM | POA: Diagnosis present

## 2017-04-12 DIAGNOSIS — E46 Unspecified protein-calorie malnutrition: Secondary | ICD-10-CM | POA: Diagnosis present

## 2017-04-12 DIAGNOSIS — R627 Adult failure to thrive: Secondary | ICD-10-CM | POA: Diagnosis present

## 2017-04-12 DIAGNOSIS — R740 Nonspecific elevation of levels of transaminase and lactic acid dehydrogenase [LDH]: Secondary | ICD-10-CM | POA: Diagnosis present

## 2017-04-12 DIAGNOSIS — G8929 Other chronic pain: Secondary | ICD-10-CM | POA: Diagnosis present

## 2017-04-12 DIAGNOSIS — Z7982 Long term (current) use of aspirin: Secondary | ICD-10-CM

## 2017-04-12 DIAGNOSIS — Z85038 Personal history of other malignant neoplasm of large intestine: Secondary | ICD-10-CM

## 2017-04-12 DIAGNOSIS — Z6827 Body mass index (BMI) 27.0-27.9, adult: Secondary | ICD-10-CM | POA: Diagnosis not present

## 2017-04-12 DIAGNOSIS — K760 Fatty (change of) liver, not elsewhere classified: Secondary | ICD-10-CM | POA: Diagnosis present

## 2017-04-12 DIAGNOSIS — B009 Herpesviral infection, unspecified: Secondary | ICD-10-CM | POA: Diagnosis present

## 2017-04-12 DIAGNOSIS — M5136 Other intervertebral disc degeneration, lumbar region: Secondary | ICD-10-CM | POA: Diagnosis present

## 2017-04-12 DIAGNOSIS — E860 Dehydration: Secondary | ICD-10-CM | POA: Diagnosis not present

## 2017-04-12 DIAGNOSIS — Z9484 Stem cells transplant status: Secondary | ICD-10-CM

## 2017-04-12 DIAGNOSIS — Z885 Allergy status to narcotic agent status: Secondary | ICD-10-CM

## 2017-04-12 DIAGNOSIS — H409 Unspecified glaucoma: Secondary | ICD-10-CM | POA: Diagnosis present

## 2017-04-12 DIAGNOSIS — D72829 Elevated white blood cell count, unspecified: Secondary | ICD-10-CM | POA: Diagnosis present

## 2017-04-12 DIAGNOSIS — Z79899 Other long term (current) drug therapy: Secondary | ICD-10-CM

## 2017-04-12 DIAGNOSIS — T451X5D Adverse effect of antineoplastic and immunosuppressive drugs, subsequent encounter: Secondary | ICD-10-CM | POA: Diagnosis not present

## 2017-04-12 DIAGNOSIS — Z85118 Personal history of other malignant neoplasm of bronchus and lung: Secondary | ICD-10-CM

## 2017-04-12 LAB — URINALYSIS, ROUTINE W REFLEX MICROSCOPIC
BILIRUBIN URINE: NEGATIVE
GLUCOSE, UA: NEGATIVE mg/dL
Hgb urine dipstick: NEGATIVE
KETONES UR: NEGATIVE mg/dL
Nitrite: NEGATIVE
Protein, ur: NEGATIVE mg/dL
SPECIFIC GRAVITY, URINE: 1.012 (ref 1.005–1.030)
pH: 5 (ref 5.0–8.0)

## 2017-04-12 LAB — COMPREHENSIVE METABOLIC PANEL
ALT: 12 U/L — AB (ref 14–54)
ANION GAP: 8 (ref 5–15)
AST: 27 U/L (ref 15–41)
Albumin: 2.5 g/dL — ABNORMAL LOW (ref 3.5–5.0)
Alkaline Phosphatase: 64 U/L (ref 38–126)
BUN: 9 mg/dL (ref 6–20)
CALCIUM: 12.2 mg/dL — AB (ref 8.9–10.3)
CHLORIDE: 109 mmol/L (ref 101–111)
CO2: 25 mmol/L (ref 22–32)
CREATININE: 1.03 mg/dL — AB (ref 0.44–1.00)
GFR, EST NON AFRICAN AMERICAN: 55 mL/min — AB (ref >60)
Glucose, Bld: 82 mg/dL (ref 65–99)
Potassium: 3.6 mmol/L (ref 3.5–5.1)
Sodium: 142 mmol/L (ref 135–145)
Total Bilirubin: 0.7 mg/dL (ref 0.3–1.2)
Total Protein: 7.8 g/dL (ref 6.5–8.1)

## 2017-04-12 LAB — I-STAT CG4 LACTIC ACID, ED
LACTIC ACID, VENOUS: 2.17 mmol/L — AB (ref 0.5–1.9)
Lactic Acid, Venous: 1.65 mmol/L (ref 0.5–1.9)

## 2017-04-12 LAB — CBC WITH DIFFERENTIAL/PLATELET
BASOS ABS: 0 10*3/uL (ref 0.0–0.1)
BASOS PCT: 0 %
EOS PCT: 1 %
Eosinophils Absolute: 0.1 10*3/uL (ref 0.0–0.7)
HCT: 29.8 % — ABNORMAL LOW (ref 36.0–46.0)
Hemoglobin: 9.1 g/dL — ABNORMAL LOW (ref 12.0–15.0)
Lymphocytes Relative: 6 %
Lymphs Abs: 0.9 10*3/uL (ref 0.7–4.0)
MCH: 29.4 pg (ref 26.0–34.0)
MCHC: 30.5 g/dL (ref 30.0–36.0)
MCV: 96.4 fL (ref 78.0–100.0)
MONO ABS: 2.6 10*3/uL — AB (ref 0.1–1.0)
Monocytes Relative: 16 %
NEUTROS ABS: 12.4 10*3/uL — AB (ref 1.7–7.7)
Neutrophils Relative %: 77 %
PLATELETS: 225 10*3/uL (ref 150–400)
RBC: 3.09 MIL/uL — ABNORMAL LOW (ref 3.87–5.11)
RDW: 18.2 % — AB (ref 11.5–15.5)
WBC: 16.1 10*3/uL — ABNORMAL HIGH (ref 4.0–10.5)

## 2017-04-12 LAB — PROTIME-INR
INR: 1.13
PROTHROMBIN TIME: 14.6 s (ref 11.4–15.2)

## 2017-04-12 MED ORDER — PANTOPRAZOLE SODIUM 40 MG PO TBEC
40.0000 mg | DELAYED_RELEASE_TABLET | Freq: Every day | ORAL | Status: DC
  Administered 2017-04-12 – 2017-04-15 (×4): 40 mg via ORAL
  Filled 2017-04-12 (×4): qty 1

## 2017-04-12 MED ORDER — DORZOLAMIDE HCL-TIMOLOL MAL 2-0.5 % OP SOLN
1.0000 [drp] | Freq: Two times a day (BID) | OPHTHALMIC | Status: DC
  Administered 2017-04-12 – 2017-04-15 (×6): 1 [drp] via OPHTHALMIC
  Filled 2017-04-12: qty 10

## 2017-04-12 MED ORDER — MIRTAZAPINE 30 MG PO TABS
30.0000 mg | ORAL_TABLET | Freq: Every day | ORAL | Status: DC
  Administered 2017-04-12 – 2017-04-14 (×3): 30 mg via ORAL
  Filled 2017-04-12 (×3): qty 1

## 2017-04-12 MED ORDER — LATANOPROST 0.005 % OP SOLN
1.0000 [drp] | Freq: Every day | OPHTHALMIC | Status: DC
  Administered 2017-04-12 – 2017-04-14 (×3): 1 [drp] via OPHTHALMIC
  Filled 2017-04-12: qty 2.5

## 2017-04-12 MED ORDER — ACETAMINOPHEN 325 MG PO TABS
650.0000 mg | ORAL_TABLET | Freq: Once | ORAL | Status: AC
  Administered 2017-04-12: 650 mg via ORAL
  Filled 2017-04-12: qty 2

## 2017-04-12 MED ORDER — ENOXAPARIN SODIUM 40 MG/0.4ML ~~LOC~~ SOLN
40.0000 mg | SUBCUTANEOUS | Status: DC
  Administered 2017-04-12 – 2017-04-14 (×3): 40 mg via SUBCUTANEOUS
  Filled 2017-04-12 (×3): qty 0.4

## 2017-04-12 MED ORDER — ADULT MULTIVITAMIN W/MINERALS CH
1.0000 | ORAL_TABLET | Freq: Every day | ORAL | Status: DC
  Administered 2017-04-12 – 2017-04-15 (×4): 1 via ORAL
  Filled 2017-04-12 (×4): qty 1

## 2017-04-12 MED ORDER — VANCOMYCIN HCL IN DEXTROSE 750-5 MG/150ML-% IV SOLN
750.0000 mg | Freq: Two times a day (BID) | INTRAVENOUS | Status: DC
  Administered 2017-04-13: 750 mg via INTRAVENOUS
  Filled 2017-04-12 (×5): qty 150

## 2017-04-12 MED ORDER — LATANOPROST 0.005 % OP SOLN
OPHTHALMIC | Status: AC
  Filled 2017-04-12: qty 2.5

## 2017-04-12 MED ORDER — VANCOMYCIN HCL IN DEXTROSE 1-5 GM/200ML-% IV SOLN
1000.0000 mg | Freq: Once | INTRAVENOUS | Status: AC
  Administered 2017-04-12: 1000 mg via INTRAVENOUS
  Filled 2017-04-12: qty 200

## 2017-04-12 MED ORDER — ASPIRIN EC 81 MG PO TBEC
81.0000 mg | DELAYED_RELEASE_TABLET | Freq: Every day | ORAL | Status: DC
  Administered 2017-04-12 – 2017-04-15 (×4): 81 mg via ORAL
  Filled 2017-04-12 (×4): qty 1

## 2017-04-12 MED ORDER — SODIUM CHLORIDE 0.9 % IV SOLN
INTRAVENOUS | Status: DC
  Administered 2017-04-12 – 2017-04-14 (×3): via INTRAVENOUS

## 2017-04-12 MED ORDER — TEMAZEPAM 15 MG PO CAPS
15.0000 mg | ORAL_CAPSULE | Freq: Every evening | ORAL | Status: DC | PRN
Start: 2017-04-12 — End: 2017-04-15
  Filled 2017-04-12: qty 1

## 2017-04-12 MED ORDER — GABAPENTIN 100 MG PO CAPS
100.0000 mg | ORAL_CAPSULE | Freq: Every day | ORAL | Status: DC
Start: 2017-04-12 — End: 2017-04-15
  Administered 2017-04-12 – 2017-04-15 (×4): 100 mg via ORAL
  Filled 2017-04-12 (×4): qty 1

## 2017-04-12 MED ORDER — LOPERAMIDE HCL 2 MG PO CAPS
2.0000 mg | ORAL_CAPSULE | Freq: Three times a day (TID) | ORAL | Status: DC | PRN
  Administered 2017-04-12 – 2017-04-15 (×5): 2 mg via ORAL
  Filled 2017-04-12 (×5): qty 1

## 2017-04-12 MED ORDER — ENSURE ENLIVE PO LIQD
237.0000 mL | Freq: Two times a day (BID) | ORAL | Status: DC
  Administered 2017-04-13 (×2): 237 mL via ORAL

## 2017-04-12 MED ORDER — OXYCODONE-ACETAMINOPHEN 5-325 MG PO TABS
1.0000 | ORAL_TABLET | ORAL | Status: DC | PRN
  Administered 2017-04-12 – 2017-04-15 (×10): 1 via ORAL
  Filled 2017-04-12 (×10): qty 1

## 2017-04-12 MED ORDER — MORPHINE SULFATE (PF) 2 MG/ML IV SOLN
1.0000 mg | INTRAVENOUS | Status: DC | PRN
Start: 2017-04-12 — End: 2017-04-15

## 2017-04-12 MED ORDER — DEXTROSE 5 % IV SOLN
1.0000 g | Freq: Three times a day (TID) | INTRAVENOUS | Status: DC
  Administered 2017-04-12 – 2017-04-15 (×10): 1 g via INTRAVENOUS
  Filled 2017-04-12 (×16): qty 1

## 2017-04-12 MED ORDER — LORATADINE 10 MG PO TABS
10.0000 mg | ORAL_TABLET | Freq: Every day | ORAL | Status: DC
  Administered 2017-04-12 – 2017-04-15 (×4): 10 mg via ORAL
  Filled 2017-04-12 (×4): qty 1

## 2017-04-12 MED ORDER — ACETAMINOPHEN 500 MG PO TABS: 500.0000 mg | ORAL_TABLET | Freq: Four times a day (QID) | ORAL | Status: DC | PRN

## 2017-04-12 MED ORDER — GUAIFENESIN-DM 100-10 MG/5ML PO SYRP: 5.0000 mL | ORAL_SOLUTION | ORAL | Status: DC | PRN

## 2017-04-12 NOTE — ED Triage Notes (Signed)
Pt recently Dcd from [facility] with dx of pneumonia Now with low grade fever and wheezing  Dr Moshe Cipro is her PCP

## 2017-04-12 NOTE — ED Provider Notes (Signed)
[facility] DEPT Provider Note   CSN: [id number] Arrival date & time: 04/12/17  1154  By signing my name below, I, Higinio Plan, attest that this documentation has been prepared under the direction and in the presence of Carmin Muskrat, MD . Electronically Signed: Higinio Plan, Scribe. 04/12/2017. 1:00 PM.  History   Chief Complaint Chief Complaint  Patient presents with  . Tachycardia   The history is provided by the patient. No language interpreter was used.   HPI Comments: Darlene Howard is a 66 y.o. female with PMHx of HTN, DM2, multiple myeloma, lung cancer, and colon cancer, who presents to the Emergency Department accompanied by her daughter complaining of gradually worsening, generalized weakness and fatigue that began yesterday. Per daughter, pt was diagnosed with pneumonia and sepsis on 4/4 in which she was admitted to [facility] and discharged on 4/8. She states she noticed increased weakness yesterday as she attempted to help pt stand and she was unable to. She notes pt has experienced worsening ability to perform daily functions independently since her diagnosis of multiple myeloma 12 years ago. Pt reports associated low-grade fever (TMAX 99.5 in the ED), chronic lower back pain, and bilateral leg pain associated with her multiple myeloma. She states she received a stem cell transplant in 2007 but is no longer receiving therapy as her myeloma has become more aggressive. However, she notes she is scheduled for an appointment on 4/17 at [facility] to discuss other treatment options. Pt denies any loss of consciousness or other complaints.   I discussed patient's case prior to her arrival, with the primary care physician called to alert [place] to the situation.   Past Medical History:  Diagnosis Date  . Bruises easily    d/t being on COumadin  . Cholelithiasis   . DDD (degenerative disc disease), lumbar   . Dehydration 03/11/2017  . Fatty liver   . GERD (gastroesophageal reflux  disease)    takes Pantoprazole daily  . Glaucoma    uses eye drops as instructed  . H/O stem cell transplant ([place])    04-26-2006  AT [facility]  . Herpes    takes Valtrex as needed  . History of colon polyps   . Hypercalcemia of malignancy 03/11/2017  . Hypertension    takes Amlodipine and Diovan daily  . Left radial fracture 01/21/2017  . Lung cancer ([place])    chest wall lesion aadn 6th rib  . Multiple myeloma DX  2006  STATE IIA,  IgA  KAPPA   STABLE  PER DR MOHAMED--  S/P STEM CELL TRANSPLANT 2007 /  RECURRENCE 2008  CHEMO ENDED 03-30-2008/   NOW ON MAINTANANCE REVLIMID )  . 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 Personal history of colon cancer    ASCENDING COLON--  S/P  RIGHT HEMICOLECTOMY WITH NEGATIVE NODES/    NO RECURRENCE  . Type 2 diabetes mellitus ([place])   . Vocal cord nodule    resolved- see Dr. Deeann Saint notes in media tab 06/17/14    Patient Active Problem List   Diagnosis Date Noted  . Chemotherapy-induced neuropathy ([place]) 04/03/2017  . Lobar pneumonia ([place]) 04/03/2017  . Depression 03/18/2017  . Protein calorie malnutrition ([place]) 03/18/2017  . Hypercalcemia of malignancy 03/11/2017  . Dehydration 03/11/2017  . Anemia of chronic disease 02/11/2017  . ERRONEOUS ENCOUNTER--DISREGARD 01/27/2017  . Left radial fracture 01/21/2017  . Encounter for antineoplastic chemotherapy 08/27/2016  . Insomnia 05/23/2016  .  Thrombocytopenia ([place]) 05/21/2016  . Multiple myeloma not having achieved remission ([place]) 03/12/2016  . Fatty liver 12/06/2014  . GERD (gastroesophageal reflux disease) 11/02/2014  . 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 Back pain with left-sided radiculopathy 02/22/2014  . Goiter 02/22/2014  . Acute bronchitis 08/25/2013  . Maxillary sinusitis, acute 12/29/2012  . DISORDER OF BONE AND CARTILAGE UNSPECIFIED  08/16/2010  . Allergic rhinitis 04/30/2010  . Carcinoma in situ of colon 09/28/2008  . Multiple myeloma ([place]) 05/03/2008  . Glaucoma 05/03/2008  . Essential hypertension 05/03/2008    Past Surgical History:  Procedure Laterality Date  . ABDOMINAL HYSTERECTOMY  1983   w/  appendectomy and left salpingoophorectom  . CATARACT EXTRACTION W/PHACO Left 01/20/2014   Procedure: CATARACT EXTRACTION PHACO AND INTRAOCULAR LENS PLACEMENT (IOC);  Surgeon: Adonis Brook, MD;  Location: [facility];  Service: Ophthalmology;  Laterality: Left;  . CERVICAL CONE BIOPSY    . CHOLECYSTECTOMY N/A 02/28/2016   Procedure: LAPAROSCOPIC CHOLECYSTECTOMY;  Surgeon: Ralene Ok, MD;  Location: [facility];  Service: General;  Laterality: N/A;  . CO2  ABLATION VULVA PERIANAL AND VAGINAL DYSPLATIC AREAS  02-15-2010  . COLONOSCOPY  10/07/2006   HQP:RFFMBW rectum/Status post right hemicolectomy. Normal residual colon  . COLONOSCOPY  10/2009   RMR: diminutive RS polyp (hyperplastic).   . COLONOSCOPY N/A 12/15/2014   Procedure: COLONOSCOPY;  Surgeon: Daneil Dolin, MD;  [facility];  Service: Endoscopy;  Laterality: N/A;  130   . ESOPHAGOGASTRODUODENOSCOPY  04/02/2005   GYK:ZLDJTT/SVXBLTJQZESP polyp with central erosion versus ulceration in the body of the stomach, resected and recovered Remainder of the gastric mucosa, D1, D2 appeared normal  . HEMICOLECTOMY Right 04-27-2005   CARCINOMA   ASCENDING COLON  . LESION REMOVAL N/A 05/06/2014   Procedure: LASER OF THE VAGINA;  Surgeon: Janie Morning, MD;  Location: [facility];  Service: Gynecology;  Laterality: N/A;  . LIMBAL STEM CELL TRANSPLANT  04-26-2006    ([facility])  . VULVAR LESION REMOVAL N/A 05/06/2014   Procedure: LASER OF VULVAR LESION;  Surgeon: Janie Morning, MD;  Location: [facility];  Service: Gynecology;  Laterality: N/A;  . VULVECTOMY N/A 05/06/2014   Procedure: WIDE LOCAL EXCISION LEFT LABIA MAJORA;  Surgeon: Janie Morning,  MD;  Location: [facility];  Service: Gynecology;  Laterality: N/A;    OB History    No data available     Home Medications    Prior to Admission medications   Medication Sig Start Date End Date Taking? Authorizing Provider  acetaminophen (TYLENOL) 500 MG tablet Take 500 mg by mouth every 6 (six) hours as needed for mild pain, moderate pain, fever or headache.     Historical Provider, MD  aspirin EC 81 MG tablet Take 81 mg by mouth every evening.    Historical Provider, MD  calcium-vitamin D (OSCAL 500/200 D-3) 500-200 MG-UNIT per tablet Take 1 tablet by mouth 3 (three) times daily.      Historical Provider, MD  cholecalciferol (VITAMIN D) 1000 UNITS tablet Take 1,000 Units by mouth daily.     Historical Provider, MD  dexamethasone (DECADRON) 4 MG tablet TAKE 10 TABS ONCE WEEKLY  WHILE UNDERGOING VELCADE  INJECTIONS. Patient taking differently: Take 10 tablets by mouth every Monday while undergoing Velcade injections 01/29/17   Curt Bears, MD  dorzolamide-timolol (COSOPT) 22.3-6.8 MG/ML ophthalmic solution Place 1 drop into the left eye 2 (two) times  daily.     Historical Provider, MD  gabapentin (NEURONTIN) 100 MG capsule TAKE 1 CAPSULE BY MOUTH 3  TIMES DAILY 12/12/16   Fayrene Helper, MD  guaiFENesin-dextromethorphan [facility] DM) 100-10 MG/5ML syrup Take 5 mLs by mouth every 4 (four) hours as needed for cough. 04/07/17   Costin Karlyne Greenspan, MD  levofloxacin (LEVAQUIN) 500 MG tablet Take 1 tablet (500 mg total) by mouth daily. 04/07/17   Costin Karlyne Greenspan, MD  loperamide (IMODIUM) 2 MG capsule Take 2-4 mg by mouth every 2 (two) hours as needed for diarrhea or loose stools.    Historical Provider, MD  loratadine (CLARITIN) 10 MG tablet TAKE ONE TABLET BY MOUTH ONCE DAILY. 04/02/17   Fayrene Helper, MD  LUMIGAN 0.01 % SOLN Place 1 drop into both eyes at bedtime.     Historical Provider, MD  mirtazapine (REMERON) 30 MG tablet Take 1 tablet (30 mg total) by mouth at  bedtime. 03/18/17   Maryanna Shape, NP  Multiple Vitamin (MULTIVITAMIN WITH MINERALS) TABS tablet Take 1 tablet by mouth daily.    Historical Provider, MD  oxyCODONE-acetaminophen (PERCOCET/ROXICET) 5-325 MG tablet Take 1 tablet by mouth every 4 (four) hours as needed for severe pain. May take one tablet every 4-6 hours as needed for pain 04/01/17   Curt Bears, MD  pantoprazole (PROTONIX) 40 MG tablet TAKE 1 TABLET BY MOUTH  DAILY FOR ACID REFLUX 10/18/16   Fayrene Helper, MD  potassium chloride SA (K-DUR,KLOR-CON) 20 MEQ tablet TAKE 1 TABLET BY MOUTH  TWICE A DAY 10/18/16   Fayrene Helper, MD  PREMARIN vaginal cream PLACE 1 APPLICATORFUL VAGINALLY TWICE A WEEK. 06/29/16   Melissa D Cross, NP  temazepam (RESTORIL) 15 MG capsule TAKE 1 CAPSULE AT BEDTIME AS NEEDED FOR SLEEP. 08/23/16   Fayrene Helper, MD  valACYclovir (VALTREX) 1000 MG tablet TAKE 1 TABLET BY MOUTH  DAILY AS NEEDED Patient taking differently: Take 1 tablet by mouth daily as needed for cold sores 03/19/17   Fayrene Helper, MD    Family History Family History  Problem Relation Age of Onset  . Kidney failure Mother   . Hypertension Mother   . Prostate cancer Father 70  . Hypertension Father     vascular disease   . Stroke Father   . Hypertension Sister   . Hyperlipidemia Sister   . Hypertension Brother   . Hyperlipidemia Brother   . Hypertension Sister   . Hypertension Sister   . Colon cancer Neg Hx   . Breast cancer Neg Hx     Social History Social History  Substance Use Topics  . Smoking status: Never Smoker  . Smokeless tobacco: Never Used  . Alcohol use No   Allergies   Codeine and Tramadol  Review of Systems Review of Systems  Constitutional: Positive for fatigue and fever.  HENT: Negative.   Respiratory: Positive for cough and shortness of breath. Negative for chest tightness.   Cardiovascular: Negative for chest pain.  Gastrointestinal: Negative for diarrhea and vomiting.    Genitourinary: Negative.   Musculoskeletal: Positive for arthralgias (chronic) and back pain (chronic).  Skin: Negative for wound.  Allergic/Immunologic: Positive for immunocompromised state.  Neurological: Positive for weakness. Negative for syncope.   Physical Exam Updated Vital Signs BP (!) 147/80 (BP Location: Left Arm)   Pulse (!) 123   Temp 99.3 F (37.4 C) (Temporal)   Resp (!) 24   SpO2 95%   Physical Exam  Constitutional: She  is oriented to person, place, and time. She has a sickly appearance. No distress.  HENT:  Head: Normocephalic and atraumatic.  Eyes: Conjunctivae and EOM are normal.  Cardiovascular: Normal rate and regular rhythm.   Pulmonary/Chest: No stridor. Tachypnea noted. She has decreased breath sounds.  Abdominal: She exhibits no distension.  Musculoskeletal: She exhibits no edema.  Neurological: She is alert and oriented to person, place, and time. No cranial nerve deficit.  Skin: Skin is warm and dry.  Psychiatric: She has a normal mood and affect.  Nursing note and vitals reviewed.   ED Treatments / Results  DIAGNOSTIC STUDIES:  Oxygen Saturation is 95% on RA, adequate by my interpretation.    COORDINATION OF CARE:  12:47 PM Discussed treatment plan with pt at bedside and pt agreed to plan.  Labs (all labs ordered are listed, but only abnormal results are displayed) Labs Reviewed  COMPREHENSIVE METABOLIC PANEL - Abnormal; Notable for the following:       Result Value   Creatinine, Ser 1.03 (*)    Calcium 12.2 (*)    Albumin 2.5 (*)    ALT 12 (*)    GFR calc non Af Amer 55 (*)    All other components within normal limits  CBC WITH DIFFERENTIAL/PLATELET - Abnormal; Notable for the following:    WBC 16.1 (*)    RBC 3.09 (*)    Hemoglobin 9.1 (*)    HCT 29.8 (*)    RDW 18.2 (*)    Neutro Abs 12.4 (*)    Monocytes Absolute 2.6 (*)    All other components within normal limits  I-STAT CG4 LACTIC ACID, ED - Abnormal; Notable for the  following:    Lactic Acid, Venous 2.17 (*)    All other components within normal limits  CULTURE, BLOOD (ROUTINE X 2)  CULTURE, BLOOD (ROUTINE X 2)  PROTIME-INR  URINALYSIS, ROUTINE W REFLEX MICROSCOPIC  URINALYSIS, ROUTINE W REFLEX MICROSCOPIC  I-STAT CG4 LACTIC ACID, ED    EKG  EKG Interpretation  Date/Time:  Friday April 12 2017 12:48:45 EDT Ventricular Rate:  120 PR Interval:    QRS Duration: 83 QT Interval:  314 QTC Calculation: 444 R Axis:   -50 Text Interpretation:  Sinus tachycardia Atrial premature complex T wave abnormality Abnormal ekg Confirmed by Carmin Muskrat  MD [phone]) on 04/12/2017 1:02:36 PM       Radiology Dg Chest 2 View  Result Date: 04/12/2017 CLINICAL DATA:  Fevers and recent pneumonia, history of myeloma EXAM: CHEST  2 VIEW COMPARISON:  04/03/2017 FINDINGS: Cardiac shadow is stable. The lungs are well aerated bilaterally. Old rib fractures are again seen bilaterally with interval healing. Fullness is again noted in the right suprahilar region consistent with focal infiltrate. No new mass lesion is seen. IMPRESSION: Stable appearing right suprahilar infiltrate. Chronic changes consistent with the known history of multiple myeloma. Electronically Signed   By: Inez Catalina M.D.   On: 04/12/2017 12:27    Procedures Procedures (including critical care time)  Medications Ordered in ED Medications - No data to display  Initial Impression / Assessment and Plan / ED Course  I have reviewed the triage vital signs and the nursing notes.  Pertinent labs & imaging results that were available during my care of the patient were reviewed by me and considered in my medical decision making (see chart for details).  This elderly female with recent hospitalization for pneumonia, as well as ongoing evaluation for multiple myeloma, in addition to a host  of other medical issues presents with concern for ongoing fatigue, cough, weakness, dyspnea. The patient is awake and  alert, afebrile, but is found to have persistent leukocytosis, as well as lactic acidosis. X-ray does not demonstrate substantial improvement in her previously diagnosed pneumonia, and given the persistency of symptoms, and her immunocompromised state, the patient was admitted for further evaluation and management after initial fluid resuscitation, broad-spectrum antibiotics were provided in the emergency department.}  Final Clinical Impressions(s) / ED Diagnoses  Healthcare acquired pneumonia   Carmin Muskrat, MD 04/12/17 1424

## 2017-04-12 NOTE — Telephone Encounter (Signed)
Direct contact made with family, Ezzard Flax and [name], I advised them to transport Darlene Howard to nearest hospital because of her curent condition, advised that if needed to be transferred to [place] at any time, either admission or floor staff would do se, also let her know I will call to ED physician to make them aware she is on her way as pt is extremely weak, unable to sit/ for any period, and needs to be lifted Stated she had left a msg yesterday about blood pressure being elevated, I see no record of call

## 2017-04-12 NOTE — H&P (Signed)
History and Physical    Darlene Howard CNO:[id number] DOB: 07-09-1951 DOA: 04/12/2017  PCP: [name], MD  Patient coming from: Home  I have personally briefly reviewed patient's old medical records in [place]  Chief Complaint: sob and generalized weakness since discharge.   HPI: Darlene Howard is a 66 y.o. 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  She reports she had fever, worsening sob. On arrival to ED, she was tachycardic, dehydrated and in mild renal insufficiency and labs show elevated lactic acid. CBC showed elevated WBC count at 16,000, hemoglobin around 9.1. UA showed leukocytosis and wbc in the urine. She was referred to medical service for worsening fatigue, persistent sob on ambulation, and persistent pneumonia.   Review of Systems: As per HPI otherwise 10 point review of systems negative.    Past Medical History:  Diagnosis Date  . Bruises easily    d/t being on COumadin  . Cholelithiasis   . DDD (degenerative disc disease), lumbar   . Dehydration 03/11/2017  . Fatty liver   . GERD (gastroesophageal reflux disease)    takes Pantoprazole daily  . Glaucoma    uses eye drops as instructed  . H/O stem cell transplant ([place])    04-26-2006  AT [facility]  . Herpes    takes Valtrex as needed  . History of colon polyps   . Hypercalcemia of malignancy 03/11/2017  . Hypertension    takes Amlodipine and Diovan daily  . Left radial fracture 01/21/2017  . Lung cancer ([place])    chest wall lesion aadn 6th rib  . Multiple myeloma DX  2006  STATE IIA,  IgA  KAPPA   STABLE  PER DR MOHAMED--  S/P STEM CELL TRANSPLANT 2007 /  RECURRENCE 2008  CHEMO ENDED 03-30-2008/   NOW ON MAINTANANCE REVLIMID )  . Neuropathy associated with multiple  myeloma ([facility])    POST CHEMOTHERAPY AND STEM CELL TRANSPLANT  . Personal history of colon cancer    ASCENDING COLON--  S/P  RIGHT HEMICOLECTOMY WITH NEGATIVE NODES/    NO RECURRENCE  . Type 2 diabetes mellitus ([place])   . Vocal cord nodule    resolved- see Dr. Deeann Saint notes in media tab 06/17/14    Past Surgical History:  Procedure Laterality Date  . ABDOMINAL HYSTERECTOMY  1983   w/  appendectomy and left salpingoophorectom  . CATARACT EXTRACTION W/PHACO Left 01/20/2014   Procedure: CATARACT EXTRACTION PHACO AND INTRAOCULAR LENS PLACEMENT (IOC);  Surgeon: Adonis Brook, MD;  Location: [place] [facility];  Service: Ophthalmology;  Laterality: Left;  . CERVICAL CONE BIOPSY    . CHOLECYSTECTOMY N/A 02/28/2016   Procedure: LAPAROSCOPIC CHOLECYSTECTOMY;  Surgeon: Ralene Ok, MD;  Location: [facility];  Service: General;  Laterality: N/A;  . CO2  ABLATION VULVA PERIANAL AND VAGINAL DYSPLATIC AREAS  02-15-2010  . COLONOSCOPY  10/07/2006   QHU:TMLYYT rectum/Status post right hemicolectomy. Normal residual colon  . COLONOSCOPY  10/2009   RMR: diminutive RS polyp (hyperplastic).   . COLONOSCOPY N/A 12/15/2014   Procedure: COLONOSCOPY;  Surgeon: Daneil Dolin, MD;  [facility];  Service: Endoscopy;  Laterality: N/A;  130   . ESOPHAGOGASTRODUODENOSCOPY  04/02/2005   KPT:WSFKCL/EXNTZGYFVCBS polyp with central erosion versus ulceration in the body of the stomach, resected and recovered Remainder of the  gastric mucosa, D1, D2 appeared normal  . HEMICOLECTOMY Right 04-27-2005   CARCINOMA   ASCENDING COLON  . LESION REMOVAL N/A 05/06/2014   Procedure: LASER OF THE VAGINA;  Surgeon: Janie Morning, MD;  Location: [facility];  Service: Gynecology;  Laterality: N/A;  . LIMBAL STEM CELL TRANSPLANT  04-26-2006    ([facility])  . VULVAR LESION REMOVAL N/A 05/06/2014   Procedure: LASER OF VULVAR LESION;  Surgeon: Janie Morning, MD;  Location: [facility];  Service: Gynecology;  Laterality:  N/A;  . VULVECTOMY N/A 05/06/2014   Procedure: WIDE LOCAL EXCISION LEFT LABIA MAJORA;  Surgeon: Janie Morning, MD;  Location: [facility];  Service: Gynecology;  Laterality: N/A;     reports that she has never smoked. She has never used smokeless tobacco. She reports that she does not drink alcohol or use drugs.  Allergies  Allergen Reactions  . Codeine Nausea And Vomiting  . Tramadol Nausea And Vomiting    Family History  Problem Relation Age of Onset  . Kidney failure Mother   . Hypertension Mother   . Prostate cancer Father 46  . Hypertension Father     vascular disease   . Stroke Father   . Hypertension Sister   . Hyperlipidemia Sister   . Hypertension Brother   . Hyperlipidemia Brother   . Hypertension Sister   . Hypertension Sister   . Colon cancer Neg Hx   . Breast cancer Neg Hx      Prior to Admission medications   Medication Sig Start Date End Date Taking? Authorizing Provider  acetaminophen (TYLENOL) 500 MG tablet Take 500 mg by mouth every 6 (six) hours as needed for mild pain, moderate pain, fever or headache.    Yes Historical Provider, MD  aspirin EC 81 MG tablet Take 81 mg by mouth daily.    Yes Historical Provider, MD  dexamethasone (DECADRON) 4 MG tablet TAKE 10 TABS ONCE WEEKLY  WHILE UNDERGOING VELCADE  INJECTIONS. Patient taking differently: Take 10 tablets by mouth every Monday while undergoing Velcade injections 01/29/17  Yes Curt Bears, MD  dorzolamide-timolol (COSOPT) 22.3-6.8 MG/ML ophthalmic solution Place 1 drop into the left eye 2 (two) times daily.    Yes Historical Provider, MD  gabapentin (NEURONTIN) 100 MG capsule Take 100 mg by mouth daily.   Yes Historical Provider, MD  guaiFENesin-dextromethorphan (ROBITUSSIN DM) 100-10 MG/5ML syrup Take 5 mLs by mouth every 4 (four) hours as needed for cough. 04/07/17  Yes Costin Karlyne Greenspan, MD  levofloxacin (LEVAQUIN) 500 MG tablet Take 1 tablet (500 mg total) by mouth daily. 04/07/17  Yes  Costin Karlyne Greenspan, MD  loperamide (IMODIUM) 2 MG capsule Take 2-4 mg by mouth every 2 (two) hours as needed for diarrhea or loose stools.   Yes Historical Provider, MD  loratadine (CLARITIN) 10 MG tablet TAKE ONE TABLET BY MOUTH ONCE DAILY. 04/02/17  Yes Fayrene Helper, MD  LUMIGAN 0.01 % SOLN Place 1 drop into both eyes at bedtime.    Yes Historical Provider, MD  mirtazapine (REMERON) 30 MG tablet Take 1 tablet (30 mg total) by mouth at bedtime. 03/18/17  Yes Maryanna Shape, NP  Multiple Vitamin (MULTIVITAMIN WITH MINERALS) TABS tablet Take 1 tablet by mouth daily.   Yes Historical Provider, MD  oxyCODONE-acetaminophen (PERCOCET/ROXICET) 5-325 MG tablet Take 1 tablet by mouth every 4 (four) hours as needed for severe pain. May take one tablet every 4-6 hours as needed for pain 04/01/17  Yes  Curt Bears, MD  pantoprazole (PROTONIX) 40 MG tablet TAKE 1 TABLET BY MOUTH  DAILY FOR ACID REFLUX 10/18/16  Yes Fayrene Helper, MD  potassium chloride SA (K-DUR,KLOR-CON) 20 MEQ tablet TAKE 1 TABLET BY MOUTH  TWICE A DAY 10/18/16  Yes Fayrene Helper, MD  PREMARIN vaginal cream PLACE 1 APPLICATORFUL VAGINALLY TWICE A WEEK. 06/29/16  Yes Melissa D Cross, NP  temazepam (RESTORIL) 15 MG capsule TAKE 1 CAPSULE AT BEDTIME AS NEEDED FOR SLEEP. 08/23/16  Yes Fayrene Helper, MD  valACYclovir (VALTREX) 1000 MG tablet TAKE 1 TABLET BY MOUTH  DAILY AS NEEDED Patient taking differently: Take 1 tablet by mouth daily as needed for cold sores 03/19/17  Yes Fayrene Helper, MD    Physical Exam: Vitals:   04/12/17 1330 04/12/17 1509 04/12/17 1600 04/12/17 1642  BP: (!) 151/85 136/80 139/82 (!) 150/79  Pulse: (!) 117 (!) 113 (!) 113 (!) 115  Resp: _0 Temp:    98.7 F (37.1 C)  TempSrc:    Oral  SpO2: 95% 97% 96% 99%  Weight:    76.2 kg (167 lb 15.9 oz)  Height:    5' 6" (1.676 m)    Constitutional: NAD, calm, comfortable Vitals:   04/12/17 1330 04/12/17 1509 04/12/17 1600 04/12/17 1642    BP: (!) 151/85 136/80 139/82 (!) 150/79  Pulse: (!) 117 (!) 113 (!) 113 (!) 115  Resp: _1 Temp:    98.7 F (37.1 C)  TempSrc:    Oral  SpO2: 95% 97% 96% 99%  Weight:    76.2 kg (167 lb 15.9 oz)  Height:    5' 6" (1.676 m)   Eyes: PERRL, lids and conjunctivae normal ENMT: Mucous membranes are moist. Posterior pharynx clear of any exudate or lesions.Normal dentition.  Neck: normal, supple, no masses, no thyromegaly Respiratory: clear to auscultation bilaterally, no wheezing, no crackles. Normal respiratory effort. No accessory muscle use.  Cardiovascular: Regular rate and rhythm, no murmurs / rubs / gallops. No extremity edema. 2+ pedal pulses. No carotid bruits.  Abdomen: no tenderness, no masses palpated. No hepatosplenomegaly. Bowel sounds positive.  Musculoskeletal: no clubbing / cyanosis. No joint deformity upper and lower extremities. Good ROM, no contractures. Normal muscle tone.  Skin: no rashes, lesions, ulcers. No induration Neurologic: CN 2-12 grossly intact. Sensation intact, DTR normal. Strength 5/5 in all     Labs on Admission: I have personally reviewed following labs and imaging studies  CBC:  Recent Labs Lab 04/06/17 0607 04/07/17 0555 04/12/17 1239  WBC 17.3* 16.7* 16.1*  NEUTROABS  --   --  12.4*  HGB 7.9* 8.1* 9.1*  HCT 25.0* 26.0* 29.8*  MCV 94.3 92.9 96.4  PLT 121* 137* 962   Basic Metabolic Panel:  Recent Labs Lab 04/06/17 0607 04/12/17 1239  NA 139 142  K 4.2 3.6  CL 111 109  CO2 20* 25  GLUCOSE 83 82  BUN 12 9  CREATININE 0.83 1.03*  CALCIUM 10.4* 12.2*   GFR: Estimated Creatinine Clearance: 55.3 mL/min (A) (by C-G formula based on SCr of 1.03 mg/dL (H)). Liver Function Tests:  Recent Labs Lab 04/06/17 0607 04/12/17 1239  AST 24 27  ALT 14 12*  ALKPHOS 63 64  BILITOT 0.8 0.7  PROT 6.3* 7.8  ALBUMIN 1.9* 2.5*   No results for input(s): LIPASE, AMYLASE in the last 168 hours. No results for input(s): AMMONIA in the  last 168 hours. Coagulation Profile:  Recent Labs Lab 04/12/17 1239  INR 1.13   Cardiac Enzymes: No results for input(s): CKTOTAL, CKMB, CKMBINDEX, TROPONINI in the last 168 hours. BNP (last 3 results) No results for input(s): PROBNP in the last 8760 hours. HbA1C: No results for input(s): HGBA1C in the last 72 hours. CBG: No results for input(s): GLUCAP in the last 168 hours. Lipid Profile: No results for input(s): CHOL, HDL, LDLCALC, TRIG, CHOLHDL, LDLDIRECT in the last 72 hours. Thyroid Function Tests: No results for input(s): TSH, T4TOTAL, FREET4, T3FREE, THYROIDAB in the last 72 hours. Anemia Panel: No results for input(s): VITAMINB12, FOLATE, FERRITIN, TIBC, IRON, RETICCTPCT in the last 72 hours. Urine analysis:    Component Value Date/Time   COLORURINE YELLOW 04/12/2017 1435   APPEARANCEUR HAZY (A) 04/12/2017 1435   LABSPEC 1.012 04/12/2017 1435   PHURINE 5.0 04/12/2017 1435   GLUCOSEU NEGATIVE 04/12/2017 1435   HGBUR NEGATIVE 04/12/2017 1435   BILIRUBINUR NEGATIVE 04/12/2017 1435   BILIRUBINUR neg 03/09/2015 1333   KETONESUR NEGATIVE 04/12/2017 1435   PROTEINUR NEGATIVE 04/12/2017 1435   UROBILINOGEN 0.2 03/09/2015 1333   NITRITE NEGATIVE 04/12/2017 1435   LEUKOCYTESUR MODERATE (A) 04/12/2017 1435    Radiological Exams on Admission: Dg Chest 2 View  Result Date: 04/12/2017 CLINICAL DATA:  Fevers and recent pneumonia, history of myeloma EXAM: CHEST  2 VIEW COMPARISON:  04/03/2017 FINDINGS: Cardiac shadow is stable. The lungs are well aerated bilaterally. Old rib fractures are again seen bilaterally with interval healing. Fullness is again noted in the right suprahilar region consistent with focal infiltrate. No new mass lesion is seen. IMPRESSION: Stable appearing right suprahilar infiltrate. Chronic changes consistent with the known history of multiple myeloma. Electronically Signed   By: Inez Catalina M.D.   On: 04/12/2017 12:27    EKG: Independently reviewed.    Assessment/Plan Active Problems:   HCAP (healthcare-associated pneumonia)  Persistent health associated pneumonia:  - admitted for IV antibiotics. Repeat blood cultures ordered.  - she is afebrile at this time, and she is stable on RA.  - started her on IV vancomycin and IV cefepime.  - PT Evaluation.    Multiple myeloma:  Currently not on treatment.  She has an appointment on Tuesday with oncology at [facility].  Will notify Dr [name] of patient's admission.    Hypertension: well controlled. Resume home meds.    Persistent leukocytosis.   Anemia of chronic disease, : STABLE hemoglobin at 9.   Dehydration: mild AKI; elevated lactic acid/ hypercalcemia:  Gentle hydration , with IV fluids. .  Repeat lactic acid level is normal.  Repeat calcium level in am.      DVT prophylaxis: lovenox.  Code Status: full code.  Family Communication: family at bedside.  Disposition Plan: pending further eval Consults called: none.  Admission status: inpatient/ med surg.    Hosie Poisson MD [facility] Pager [phone]  If 7PM-7AM, please contact night-coverage [email or web address] Password [facility]  04/12/2017, 4:46 PM

## 2017-04-12 NOTE — Progress Notes (Signed)
Pharmacy Antibiotic Note  Darlene Howard is a 66 y.o. female admitted on 04/12/2017 with pneumonia.  Pharmacy has been consulted for Vancomycin dosing.  Patient also on Cefepime per MD.  Doses initiated in ED.  Plan: Vancomycin '750mg'$  IV every 12 hours.  Goal trough 15-20 mcg/mL.  Monitor labs, micro and vitals.     Temp (24hrs), Avg:98.9 F (37.2 C), Min:98.5 F (36.9 C), Max:99.3 F (37.4 C)   Recent Labs Lab 04/06/17 0607 04/07/17 0555 04/12/17 1239 04/12/17 1255  WBC 17.3* 16.7* 16.1*  --   CREATININE 0.83  --  1.03*  --   LATICACIDVEN  --   --   --  2.17*    Estimated Creatinine Clearance: 55.6 mL/min (A) (by C-G formula based on SCr of 1.03 mg/dL (H)).    Allergies  Allergen Reactions  . Codeine Nausea And Vomiting  . Tramadol Nausea And Vomiting   Antimicrobials this admission: Vanc 4/13 >>  Cefepime 4/13 >>   Dose adjustments this admission: n/a  Microbiology results: 4/16 BCx: pending  Thank you for allowing pharmacy to be a part of this patient's care.  Darlene Howard 04/12/2017 2:56 PM

## 2017-04-12 NOTE — Telephone Encounter (Signed)
Also spoke with family and ED physician, she is on her way to [facility]

## 2017-04-12 NOTE — Telephone Encounter (Signed)
Darlene Howard (advanced) called and was concerned about Darlene Howard because her sitting heart rate was 122 and her standing rate was 132 (BP was 150/82) Temp was 99.2 and some wheezing earlier this am. Wants to know what should be done   [phone] [facility]

## 2017-04-12 NOTE — ED Notes (Signed)
Charge RN at bedside for ultrasound guided IV attempt.

## 2017-04-12 NOTE — Telephone Encounter (Signed)
I spoke directly with Darlene Howard and advised her that [facility] needs to go to the ED for evaluation, she reports fever, tachycardia and wheezing, states that she will notify the family

## 2017-04-12 NOTE — ED Notes (Signed)
Report given to Lisa RN

## 2017-04-12 NOTE — ED Triage Notes (Signed)
Pt discharged from [facility] long Sunday with pneumonia and sepsis. Family member reports BP was high yesterday with systolic over 276 and HR elevated so home health OT didn't work with pt. Today PT came to her house and attempted to stand and HR went up to 132 and temp 99.2. So family brought in for evaluation. States pt is off her BP med and calcium

## 2017-04-12 NOTE — ED Notes (Signed)
PT taken to xray

## 2017-04-12 NOTE — ED Notes (Signed)
MD At bedside - difficulty obtaining IV access (MD/Charge RN aware).

## 2017-04-13 LAB — BASIC METABOLIC PANEL
ANION GAP: 11 (ref 5–15)
BUN: 10 mg/dL (ref 6–20)
CHLORIDE: 109 mmol/L (ref 101–111)
CO2: 20 mmol/L — ABNORMAL LOW (ref 22–32)
CREATININE: 0.89 mg/dL (ref 0.44–1.00)
Calcium: 11.9 mg/dL — ABNORMAL HIGH (ref 8.9–10.3)
GFR calc non Af Amer: >60 mL/min (ref >60)
Glucose, Bld: 88 mg/dL (ref 65–99)
POTASSIUM: 3.6 mmol/L (ref 3.5–5.1)
SODIUM: 140 mmol/L (ref 135–145)

## 2017-04-13 MED ORDER — VANCOMYCIN HCL IN DEXTROSE 750-5 MG/150ML-% IV SOLN
750.0000 mg | Freq: Two times a day (BID) | INTRAVENOUS | Status: DC
  Administered 2017-04-13 – 2017-04-14 (×2): 750 mg via INTRAVENOUS
  Filled 2017-04-13 (×4): qty 150

## 2017-04-13 NOTE — Progress Notes (Signed)
PROGRESS NOTE    Darlene Howard  JXB:[id number] DOB: [birth year] DOA: 04/12/2017 PCP: [name], MD    Brief Narrative: Darlene Howard is a 66 y.o. 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  Assessment & Plan:   Active Problems:   HCAP (healthcare-associated pneumonia)  Persistent health care associated pneumonia; Admitted for IV antibiotics. Blood cultures done and pending.  Resume IV vancomycin and IV cefepime.  Persistent leukocytosis, but no fever.  Lactic acid normalized after fluids and antibiotics.    Multiple myeloma:  Pt has an appt on Tuesday with an oncologist at [facility].    Dehydration:  Improved with hydration. Resume IV fluids.    Hypercalcemia of malignancy: improved with hydration. Monitor calcium level.   Failure to thrive:  Nutritionist consutled for recommendations.   Anemia of chronic disease: stable.    Hypertension:  Well controlled.      DVT prophylaxis: (Lovenox) Code Status: (Full) Family Communication: (mom and husband at bedside.  Disposition Plan: pending further evaluation.    Consultants:  None.   Procedures: none.    Antimicrobials: vancomycin and cefepime on 4/13   Subjective: Reports feeling the same.   Objective: Vitals:   04/12/17 1600 04/12/17 1642 04/12/17 2152 04/13/17 0657  BP: 139/82 (!) 150/79 (!) 148/72 (!) 145/73  Pulse: (!) 113 (!) 115 (!) 117 (!) 107  Resp: _0 Temp:  98.7 F (37.1 C) 98.7 F (37.1 C) 98.8 F (37.1 C)  TempSrc:  Oral Oral Oral  SpO2: 96% 99% 98% 99%  Weight:  76.2 kg (167 lb 15.9 oz)    Height:  _1  (1.676 m)      Intake/Output Summary (Last 24 hours) at 04/13/17 1246 Last data filed at 04/13/17 0659  Gross per 24 hour  Intake              1684 ml  Output                0 ml  Net             1684 ml   Filed Weights   04/12/17 1642  Weight: 76.2 kg (167 lb 15.9 oz)    Examination:  General exam: Appears calm and comfortable  Respiratory system: Clear to auscultation. Respiratory effort normal. Cardiovascular system: S1 & S2 heard, RRR. No JVD, murmurs, rubs, gallops or clicks. No pedal edema. Gastrointestinal system: Abdomen is nondistended, soft and nontender. No organomegaly or masses felt. Normal bowel sounds heard. Central nervous system: Alert and oriented. No focal neurological deficits. Extremities: Symmetric 5 x 5 power. Skin: No rashes, lesions or ulcers Psychiatry: Judgement and insight appear normal. Mood & affect appropriate.     Data Reviewed: I have personally reviewed following labs and imaging studies  CBC:  Recent Labs Lab 04/07/17 0555 04/12/17 1239  WBC 16.7* 16.1*  NEUTROABS  --  12.4*  HGB 8.1* 9.1*  HCT 26.0* 29.8*  MCV 92.9 96.4  PLT 137* 130   Basic Metabolic Panel:  Recent Labs Lab 04/12/17 1239 04/13/17 0552  NA 142 140  K 3.6 3.6  CL 109 109  CO2 25 20*  GLUCOSE 82 88  BUN 9 10  CREATININE 1.03* 0.89  CALCIUM 12.2* 11.9*   GFR: Estimated Creatinine Clearance: 64 mL/min (by  C-G formula based on SCr of 0.89 mg/dL). Liver Function Tests:  Recent Labs Lab 04/12/17 1239  AST 27  ALT 12*  ALKPHOS 64  BILITOT 0.7  PROT 7.8  ALBUMIN 2.5*   No results for input(s): LIPASE, AMYLASE in the last 168 hours. No results for input(s): AMMONIA in the last 168 hours. Coagulation Profile:  Recent Labs Lab 04/12/17 1239  INR 1.13   Cardiac Enzymes: No results for input(s): CKTOTAL, CKMB, CKMBINDEX, TROPONINI in the last 168 hours. BNP (last 3 results) No results for input(s): PROBNP in the last 8760 hours. HbA1C: No results for input(s): HGBA1C in the last 72 hours. CBG: No results for input(s): GLUCAP in the last 168 hours. Lipid Profile: No results  for input(s): CHOL, HDL, LDLCALC, TRIG, CHOLHDL, LDLDIRECT in the last 72 hours. Thyroid Function Tests: No results for input(s): TSH, T4TOTAL, FREET4, T3FREE, THYROIDAB in the last 72 hours. Anemia Panel: No results for input(s): VITAMINB12, FOLATE, FERRITIN, TIBC, IRON, RETICCTPCT in the last 72 hours. Sepsis Labs:  Recent Labs Lab 04/12/17 1255 04/12/17 1511  LATICACIDVEN 2.17* 1.65    Recent Results (from the past 240 hour(s))  Blood culture (routine x 2)     Status: None   Collection Time: 04/03/17  8:20 PM  Result Value Ref Range Status   Specimen Description   Final    BLOOD LEFT ARM Performed at [facility], [address], [place], [place] [address]    Special Requests IN PEDIATRIC BOTTLE Blood Culture adequate volume  Final   Culture   Final    NO GROWTH 5 DAYS Performed at [facility], [place] [address], [place], [place] [address]    Report Status 04/09/2017 FINAL  Final  Culture, blood (routine x 2) Call MD if unable to obtain prior to antibiotics being given     Status: None   Collection Time: 04/03/17  8:20 PM  Result Value Ref Range Status   Specimen Description   Final    BLOOD LEFT HAND Performed at [facility], [address] [place], [place] [address]    Special Requests   Final    IN PEDIATRIC BOTTLE Blood Culture results may not be optimal due to an inadequate volume of blood received in culture bottles   Culture   Final    NO GROWTH 5 DAYS Performed at [facility], [place] [address], [place], [place] [address]    Report Status 04/09/2017 FINAL  Final  Culture, blood (Routine x 2)     Status: None (Preliminary result)   Collection Time: 04/12/17 12:39 PM  Result Value Ref Range Status   Specimen Description LEFT ANTECUBITAL  Final   Special Requests   Final    BOTTLES DRAWN AEROBIC AND ANAEROBIC Blood Culture results may not be optimal due to an inadequate volume of blood received in culture bottles   Culture PENDING  Incomplete     Report Status PENDING  Incomplete  Culture, blood (Routine x 2)     Status: None (Preliminary result)   Collection Time: 04/12/17  1:27 PM  Result Value Ref Range Status   Specimen Description LEFT ANTECUBITAL  Final   Special Requests   Final    BOTTLES DRAWN AEROBIC AND ANAEROBIC Blood Culture results may not be optimal due to an inadequate volume of blood received in culture bottles   Culture PENDING  Incomplete   Report Status PENDING  Incomplete         Radiology Studies:  Dg Chest 2 View  Result Date: 04/12/2017 CLINICAL DATA:  Fevers and recent pneumonia, history of myeloma EXAM: CHEST  2 VIEW COMPARISON:  04/03/2017 FINDINGS: Cardiac shadow is stable. The lungs are well aerated bilaterally. Old rib fractures are again seen bilaterally with interval healing. Fullness is again noted in the right suprahilar region consistent with focal infiltrate. No new mass lesion is seen. IMPRESSION: Stable appearing right suprahilar infiltrate. Chronic changes consistent with the known history of multiple myeloma. Electronically Signed   By: Inez Catalina M.D.   On: 04/12/2017 12:27        Scheduled Meds: . aspirin EC  81 mg Oral Daily  . ceFEPime (MAXIPIME) IV  1 g Intravenous Q8H  . dorzolamide-timolol  1 drop Left Eye BID  . enoxaparin (LOVENOX) injection  40 mg Subcutaneous Q24H  . feeding supplement (ENSURE ENLIVE)  237 mL Oral BID BM  . gabapentin  100 mg Oral Daily  . latanoprost  1 drop Both Eyes QHS  . loratadine  10 mg Oral Daily  . mirtazapine  30 mg Oral QHS  . multivitamin with minerals  1 tablet Oral Daily  . pantoprazole  40 mg Oral Daily  . vancomycin  750 mg Intravenous Q12H   Continuous Infusions: . sodium chloride 75 mL/hr at 04/13/17 1024     LOS: 1 day    Time spent: 30 minutes.     Hosie Poisson, MD [facility] Pager [phone]  If 7PM-7AM, please contact night-coverage [email or web address] Password [facility] 04/13/2017, 12:46 PM

## 2017-04-13 NOTE — Progress Notes (Signed)
Initial Nutrition Assessment  DOCUMENTATION CODES:  Not applicable  INTERVENTION:  Continue Ensure Enlive BID  At this time, eating 100% meals. Monitor PO intake and re-evaluate as needed.   NUTRITION DIAGNOSIS:  Increased nutrient needs related to acute illness (Recurrent PNA), cancer and cancer related treatments as evidenced by loss of 8% bw x 2 months  GOAL:  Patient will meet greater than or equal to 90% of their needs  MONITOR:  PO intake, Supplement acceptance, Labs, Weight trends, I & O's  REASON FOR ASSESSMENT:  Malnutrition Screening Tool    ASSESSMENT:  66 y/o female PMHx Multiple Myeloma, s/p stem cell treatment on chemo, DM, HTN. Recently had been admitted for PNA, discharged 4/8. Represents with hypertension, fever, worsening weakness/fatigue x1 day. Worked up for HCAP and AKI r/t dehydration.   RD operating remotely. Per chart review, pt has multiple myeloma that is currently not being treated due to its agressiveness. Has upcoming appt at [facility] for treatment options.   There is no direct mention of poor PO intake this admission, however she was dehydrated which could have been related to poor intake.   Review of wt history indicates her wt was stable in upper 180s end of 2017 and early 2018. However, since late January, wt has been trending down, likely due to her struggles with PNA and advancing cancer. Has lost 15 lbs (8% bw) in the last 2 months which is clinically significant  Per review of her hospital admission 4/4-4/8, she initially was eating only 25% of meals, but the last couple days of admission had improved to near 100%  She has eaten 100% of her only meal at [facility] thus far.  Has ensure Enlive ordered as supplement  Medications: IV abx, Ensure Enlive BID, PPI, MVI w/ min, remeron, IVF, Oxycodone   Labs: Albumin: 2.5, Wbc:16.1, Hypercalcemic, BGs well controlled 80-90   Recent Labs Lab 04/12/17 1239 04/13/17 0552  NA 142 140  K 3.6 3.6  CL 109  109  CO2 25 20*  BUN 9 10  CREATININE 1.03* 0.89  CALCIUM 12.2* 11.9*  GLUCOSE 82 88    Diet Order:  Diet regular Room service appropriate? Yes; Fluid consistency: Thin  Skin:  Reviewed, no issues  Last BM:  4/13  Height:  Ht Readings from Last 1 Encounters:  04/12/17 5' 6" (1.676 m)   Weight:  Wt Readings from Last 1 Encounters:  04/12/17 167 lb 15.9 oz (76.2 kg)   Wt Readings from Last 10 Encounters:  04/12/17 167 lb 15.9 oz (76.2 kg)  04/03/17 170 lb (77.1 kg)  03/18/17 167 lb 12.8 oz (76.1 kg)  03/11/17 172 lb 4.8 oz (78.2 kg)  02/11/17 183 lb 6.4 oz (83.2 kg)  01/21/17 186 lb (84.4 kg)  01/01/17 186 lb (84.4 kg)  12/28/16 187 lb (84.8 kg)  12/10/16 189 lb 3.2 oz (85.8 kg)  11/29/16 187 lb (84.8 kg)   Ideal Body Weight:  59.1 kg  BMI:  Body mass index is 27.11 kg/m.  Estimated Nutritional Needs:  Kcal:  1900-2100 (25-28 kcal/kg bw) Protein:  77-86g (1.3-1.5 g/kg IBW) Fluid:  1.9-2.1 L fluid ( 1ml/kcal)   EDUCATION NEEDS:  No education needs identified at this time  Nathan Franks RD, LDN, CNSC Clinical Nutrition Pager: [phone] 04/13/2017 8:48 AM  

## 2017-04-14 DIAGNOSIS — I10 Essential (primary) hypertension: Secondary | ICD-10-CM

## 2017-04-14 DIAGNOSIS — E860 Dehydration: Secondary | ICD-10-CM

## 2017-04-14 NOTE — Progress Notes (Signed)
PROGRESS NOTE    Darlene Howard  ZOX:[id number] DOB: 1951-05-24 DOA: 04/12/2017 PCP: [name], MD    Brief Narrative: Darlene Howard is a 66 y.o. 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  Assessment & Plan:   Active Problems:   Multiple myeloma (HCC)   Essential hypertension   Hypercalcemia of malignancy   Dehydration   Protein calorie malnutrition (HCC)   Lobar pneumonia ([place])   HCAP (healthcare-associated pneumonia)  Persistent health care associated pneumonia; Admitted for IV antibiotics. Blood cultures done and pending.  Resume IV vancomycin and IV cefepime for another 24 hours before discharge in am.  Persistent leukocytosis, but no fever.  Lactic acid normalized after fluids and antibiotics.    Multiple myeloma:  Pt has an appt on Tuesday with an oncologist at [facility].    Dehydration:  Improved with hydration. Resume IV fluids.    Hypercalcemia of malignancy: improved with hydration. Monitor calcium level in am.  Failure to thrive:  Nutritionist consulted for recommendations.   Anemia of chronic disease: stable hemoglobin at 9. Obtain H&H in am.    Hypertension:  Well controlled.      DVT prophylaxis: (Lovenox) Code Status: (Full) Family Communication: none at bedside.  Disposition Plan: pending further evaluation.    Consultants:  None.   Procedures: none.    Antimicrobials: vancomycin and cefepime on 4/13   Subjective: Has some loose stools today. Will send for stool work up for c diff.   Objective: Vitals:   04/13/17 0657 04/13/17 2253 04/14/17 0716 04/14/17 1456  BP: (!) 145/73 (!) 146/72 (!) 149/77 (!) 107/50  Pulse: (!) 107 (!) 112 (!) 107 (!) 107  Resp: '17 15 20 16  ' Temp: 98.8 F (37.1 C)  98.9 F (37.2 C) 98.7 F (37.1 C) 98.6 F (37 C)  TempSrc: Oral Oral Oral Oral  SpO2: 99% 100% 98% 100%  Weight:      Height:        Intake/Output Summary (Last 24 hours) at 04/14/17 1514 Last data filed at 04/14/17 0900  Gross per 24 hour  Intake             1750 ml  Output                0 ml  Net             1750 ml   Filed Weights   04/12/17 1642  Weight: 76.2 kg (167 lb 15.9 oz)    Examination:  General exam: Appears calm and comfortable  Respiratory system: Clear to auscultation. Respiratory effort normal. Cardiovascular system: S1 & S2 heard, RRR. No JVD, murmurs, rubs, gallops or clicks. No pedal edema. Gastrointestinal system: Abdomen is nondistended, soft and nontender. No organomegaly or masses felt. Normal bowel sounds heard. Central nervous system: Alert and oriented. No focal neurological deficits. Extremities: Symmetric 5 x 5 power. Skin: No rashes, lesions or ulcers Psychiatry: Judgement and insight appear normal. Mood & affect appropriate.     Data Reviewed: I have personally reviewed following labs and imaging studies  CBC:  Recent Labs Lab 04/12/17 1239  WBC 16.1*  NEUTROABS 12.4*  HGB 9.1*  HCT 29.8*  MCV 96.4  PLT 811   Basic Metabolic Panel:  Recent Labs Lab 04/12/17 1239 04/13/17 0552  NA 142  140  K 3.6 3.6  CL 109 109  CO2 25 20*  GLUCOSE 82 88  BUN 9 10  CREATININE 1.03* 0.89  CALCIUM 12.2* 11.9*   GFR: Estimated Creatinine Clearance: 64 mL/min (by C-G formula based on SCr of 0.89 mg/dL). Liver Function Tests:  Recent Labs Lab 04/12/17 1239  AST 27  ALT 12*  ALKPHOS 64  BILITOT 0.7  PROT 7.8  ALBUMIN 2.5*   No results for input(s): LIPASE, AMYLASE in the last 168 hours. No results for input(s): AMMONIA in the last 168 hours. Coagulation Profile:  Recent Labs Lab 04/12/17 1239  INR 1.13   Cardiac Enzymes: No results for input(s): CKTOTAL, CKMB, CKMBINDEX, TROPONINI in the last 168 hours. BNP (last 3  results) No results for input(s): PROBNP in the last 8760 hours. HbA1C: No results for input(s): HGBA1C in the last 72 hours. CBG: No results for input(s): GLUCAP in the last 168 hours. Lipid Profile: No results for input(s): CHOL, HDL, LDLCALC, TRIG, CHOLHDL, LDLDIRECT in the last 72 hours. Thyroid Function Tests: No results for input(s): TSH, T4TOTAL, FREET4, T3FREE, THYROIDAB in the last 72 hours. Anemia Panel: No results for input(s): VITAMINB12, FOLATE, FERRITIN, TIBC, IRON, RETICCTPCT in the last 72 hours. Sepsis Labs:  Recent Labs Lab 04/12/17 1255 04/12/17 1511  LATICACIDVEN 2.17* 1.65    Recent Results (from the past 240 hour(s))  Culture, blood (Routine x 2)     Status: None (Preliminary result)   Collection Time: 04/12/17 12:39 PM  Result Value Ref Range Status   Specimen Description LEFT ANTECUBITAL  Final   Special Requests   Final    BOTTLES DRAWN AEROBIC AND ANAEROBIC Blood Culture results may not be optimal due to an inadequate volume of blood received in culture bottles   Culture NO GROWTH 2 DAYS  Final   Report Status PENDING  Incomplete  Culture, blood (Routine x 2)     Status: None (Preliminary result)   Collection Time: 04/12/17  1:27 PM  Result Value Ref Range Status   Specimen Description LEFT ANTECUBITAL  Final   Special Requests   Final    BOTTLES DRAWN AEROBIC AND ANAEROBIC Blood Culture results may not be optimal due to an inadequate volume of blood received in culture bottles   Culture NO GROWTH 2 DAYS  Final   Report Status PENDING  Incomplete         Radiology Studies: No results found.      Scheduled Meds: . aspirin EC  81 mg Oral Daily  . ceFEPime (MAXIPIME) IV  1 g Intravenous Q8H  . dorzolamide-timolol  1 drop Left Eye BID  . enoxaparin (LOVENOX) injection  40 mg Subcutaneous Q24H  . feeding supplement (ENSURE ENLIVE)  237 mL Oral BID BM  . gabapentin  100 mg Oral Daily  . latanoprost  1 drop Both Eyes QHS  . loratadine  10  mg Oral Daily  . mirtazapine  30 mg Oral QHS  . multivitamin with minerals  1 tablet Oral Daily  . pantoprazole  40 mg Oral Daily  . vancomycin  750 mg Intravenous Q12H   Continuous Infusions: . sodium chloride 75 mL/hr at 04/13/17 1024     LOS: 2 days    Time spent: 30 minutes.     Hosie Poisson, MD [facility] Pager [phone]  If 7PM-7AM, please contact night-coverage [email or web address] Password [facility] 04/14/2017, 3:14 PM

## 2017-04-15 LAB — CBC
HEMATOCRIT: 26.3 % — AB (ref 36.0–46.0)
Hemoglobin: 8.2 g/dL — ABNORMAL LOW (ref 12.0–15.0)
MCH: 29.7 pg (ref 26.0–34.0)
MCHC: 31.2 g/dL (ref 30.0–36.0)
MCV: 95.3 fL (ref 78.0–100.0)
PLATELETS: 200 10*3/uL (ref 150–400)
RBC: 2.76 MIL/uL — ABNORMAL LOW (ref 3.87–5.11)
RDW: 18.3 % — ABNORMAL HIGH (ref 11.5–15.5)
WBC: 16.2 10*3/uL — AB (ref 4.0–10.5)

## 2017-04-15 LAB — BASIC METABOLIC PANEL
Anion gap: 9 (ref 5–15)
BUN: 9 mg/dL (ref 6–20)
CALCIUM: 11.6 mg/dL — AB (ref 8.9–10.3)
CO2: 23 mmol/L (ref 22–32)
Chloride: 110 mmol/L (ref 101–111)
Creatinine, Ser: 0.81 mg/dL (ref 0.44–1.00)
GFR calc Af Amer: >60 mL/min (ref >60)
Glucose, Bld: 78 mg/dL (ref 65–99)
POTASSIUM: 2.7 mmol/L — AB (ref 3.5–5.1)
SODIUM: 142 mmol/L (ref 135–145)

## 2017-04-15 LAB — C DIFFICILE QUICK SCREEN W PCR REFLEX
C DIFFICILE (CDIFF) INTERP: NOT DETECTED
C DIFFICLE (CDIFF) ANTIGEN: NEGATIVE
C Diff toxin: NEGATIVE

## 2017-04-15 LAB — VANCOMYCIN, TROUGH: VANCOMYCIN TR: 25 ug/mL — AB (ref 15–20)

## 2017-04-15 LAB — MAGNESIUM: MAGNESIUM: 1.4 mg/dL — AB (ref 1.7–2.4)

## 2017-04-15 LAB — POTASSIUM: POTASSIUM: 3.2 mmol/L — AB (ref 3.5–5.1)

## 2017-04-15 MED ORDER — POTASSIUM CHLORIDE CRYS ER 20 MEQ PO TBCR
40.0000 meq | EXTENDED_RELEASE_TABLET | Freq: Two times a day (BID) | ORAL | Status: DC
  Administered 2017-04-15 (×2): 40 meq via ORAL
  Filled 2017-04-15 (×2): qty 2

## 2017-04-15 MED ORDER — POTASSIUM CHLORIDE 10 MEQ/100ML IV SOLN
10.0000 meq | INTRAVENOUS | Status: AC
  Administered 2017-04-15 (×2): 10 meq via INTRAVENOUS
  Filled 2017-04-15 (×2): qty 100

## 2017-04-15 MED ORDER — VANCOMYCIN HCL 500 MG IV SOLR
500.0000 mg | Freq: Two times a day (BID) | INTRAVENOUS | Status: DC
  Filled 2017-04-15 (×2): qty 500

## 2017-04-15 MED ORDER — MAGNESIUM SULFATE 50 % IJ SOLN
3.0000 g | Freq: Once | INTRAVENOUS | Status: AC
  Administered 2017-04-15: 3 g via INTRAVENOUS
  Filled 2017-04-15: qty 6

## 2017-04-15 MED ORDER — ENSURE ENLIVE PO LIQD: 237.0000 mL | Freq: Two times a day (BID) | ORAL | 12 refills | Status: DC

## 2017-04-15 MED ORDER — LATANOPROST 0.005 % OP SOLN: 1.0000 [drp] | Freq: Every day | OPHTHALMIC | 12 refills | Status: AC

## 2017-04-15 NOTE — Progress Notes (Signed)
Darlene Howard discharged Home per MD order.  Discharge instructions reviewed and discussed with the patient, all questions and concerns answered. Copy of instructions and scripts given to patient.  Allergies as of 04/15/2017      Reactions   Codeine Nausea And Vomiting   Tramadol Nausea And Vomiting      Medication List    STOP taking these medications   levofloxacin 500 MG tablet Commonly known as:  LEVAQUIN     TAKE these medications   acetaminophen 500 MG tablet Commonly known as:  TYLENOL Take 500 mg by mouth every 6 (six) hours as needed for mild pain, moderate pain, fever or headache.   aspirin EC 81 MG tablet Take 81 mg by mouth daily.   dexamethasone 4 MG tablet Commonly known as:  DECADRON TAKE 10 TABS ONCE WEEKLY  WHILE UNDERGOING VELCADE  INJECTIONS. What changed:  See the new instructions.   dorzolamide-timolol 22.3-6.8 MG/ML ophthalmic solution Commonly known as:  COSOPT Place 1 drop into the left eye 2 (two) times daily.   feeding supplement (ENSURE ENLIVE) Liqd Take 237 mLs by mouth 2 (two) times daily between meals. Start taking on:  04/16/2017   gabapentin 100 MG capsule Commonly known as:  NEURONTIN Take 100 mg by mouth daily.   guaiFENesin-dextromethorphan 100-10 MG/5ML syrup Commonly known as:  ROBITUSSIN DM Take 5 mLs by mouth every 4 (four) hours as needed for cough.   latanoprost 0.005 % ophthalmic solution Commonly known as:  XALATAN Place 1 drop into both eyes at bedtime.   loperamide 2 MG capsule Commonly known as:  IMODIUM Take 2-4 mg by mouth every 2 (two) hours as needed for diarrhea or loose stools.   loratadine 10 MG tablet Commonly known as:  CLARITIN TAKE ONE TABLET BY MOUTH ONCE DAILY.   LUMIGAN 0.01 % Soln Generic drug:  bimatoprost Place 1 drop into both eyes at bedtime.   mirtazapine 30 MG tablet Commonly known as:  REMERON Take 1 tablet (30 mg total) by mouth at bedtime.   multivitamin with minerals Tabs tablet Take  1 tablet by mouth daily.   oxyCODONE-acetaminophen 5-325 MG tablet Commonly known as:  PERCOCET/ROXICET Take 1 tablet by mouth every 4 (four) hours as needed for severe pain. May take one tablet every 4-6 hours as needed for pain   pantoprazole 40 MG tablet Commonly known as:  PROTONIX TAKE 1 TABLET BY MOUTH  DAILY FOR ACID REFLUX   potassium chloride SA 20 MEQ tablet Commonly known as:  K-DUR,KLOR-CON TAKE 1 TABLET BY MOUTH  TWICE A DAY   PREMARIN vaginal cream Generic drug:  conjugated estrogens PLACE 1 APPLICATORFUL VAGINALLY TWICE A WEEK.   temazepam 15 MG capsule Commonly known as:  RESTORIL TAKE 1 CAPSULE AT BEDTIME AS NEEDED FOR SLEEP.   valACYclovir 1000 MG tablet Commonly known as:  VALTREX TAKE 1 TABLET BY MOUTH  DAILY AS NEEDED What changed:  See the new instructions.       Patients skin is clean, dry and intact, no evidence of skin break down. IV site discontinued and catheter remains intact. Site without signs and symptoms of complications. Dressing and pressure applied.  Patient escorted to car in a wheelchair,  no distress noted upon discharge.  Ralene Muskrat Nasir Bright 04/15/2017 5:51 PM

## 2017-04-15 NOTE — Care Management Important Message (Signed)
Important Message  Patient Details  Name: Darlene Howard MRN: [id number] Date of Birth: Nov 14, 1951   Medicare Important Message Given:  Yes    Sherald Barge, RN 04/15/2017, 11:48 AM

## 2017-04-15 NOTE — Progress Notes (Signed)
Pharmacy Antibiotic Note  Darlene Howard is a 66 y.o. female admitted on 04/12/2017 with pneumonia.  Pharmacy has been consulted for Vancomycin dosing.  Patient also on Cefepime per MD.  Doses initiated in ED. Vanc trough this am is elevated at 25 mcg/ml  Plan: Change vancomycin to 500 mg IV q12 hours  Cont cefepime as ordered f/u renal function, cultures and clinical course  Height: '5\' 6"'$  (167.6 cm) Weight: 167 lb 15.9 oz (76.2 kg) IBW/kg (Calculated) : 59.3  Temp (24hrs), Avg:98.6 F (37 C), Min:98.4 F (36.9 C), Max:98.8 F (37.1 C)   Recent Labs Lab 04/12/17 1239 04/12/17 1255 04/12/17 1511 04/13/17 0552 04/15/17 0518  WBC 16.1*  --   --   --  16.2*  CREATININE 1.03*  --   --  0.89 0.81  LATICACIDVEN  --  2.17* 1.65  --   --   VANCOTROUGH  --   --   --   --  25*    Estimated Creatinine Clearance: 70.3 mL/min (by C-G formula based on SCr of 0.81 mg/dL).    Allergies  Allergen Reactions  . Codeine Nausea And Vomiting  . Tramadol Nausea And Vomiting   Antimicrobials this admission: Vanc 4/13 >>  Cefepime 4/13 >>   Dose adjustments this admission: n/a  Microbiology results: 4/16 BCx: ngtd 4/13  C diff (-)  Thank you for allowing pharmacy to be a part of this patient's care.  Excell Seltzer Poteet 04/15/2017 8:10 AM

## 2017-04-15 NOTE — Care Management Note (Signed)
Case Management Note  Patient Details  Name: Darlene Howard MRN: [id number] Date of Birth: 08-15-1951  Subjective/Objective:                  Pt admitted with HCAP. She is from home, lives with her sister. Brother is at the bedside. Pt's sister assists with ADL's sometimes. She has walker and WC that she uses as needed. She is active with [facility] for PT/OT. She plans to resume services with [facility] at DC and is aware that they have 48hrs to make resumption visit.   Action/Plan: Plan for return home with resumption of HH services through [facility]. [facility] rep, Romualdo Bolk, aware of admission and DC plans, will obtain pt info from chart. Pt on the [facility] registry and will be referred for enrollment in [place] transition calls for pneumonia. Pt will DC in next 24hrs once K+ levels have normalized.   Expected Discharge Date:      04/15/2017            Expected Discharge Plan:  [place]  In-House Referral:  NA  Discharge planning Services  CM Consult  Post Acute Care Choice:  Home Health, Resumption of Svcs/PTA Provider Choice offered to:  Patient  HH Arranged:  PT, OT, RN [facility] Agency:  [place]  Status of Service:  Completed, signed off  Sherald Barge, RN 04/15/2017, 11:43 AM

## 2017-04-15 NOTE — Evaluation (Signed)
Physical Therapy Evaluation Patient Details Name: Darlene Howard MRN: [id number] DOB: [birth year] Today's Date: 04/15/2017   History of Present Illness  66 y.o. 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   She reports she had fever, worsening sob. On arrival to ED, she was tachycardic, dehydrated and in mild renal insufficiency and labs show elevated lactic acid. CBC showed elevated WBC count at 16,000, hemoglobin around 9.1. UA showed leukocytosis and wbc in the urine. She was referred to medical service for worsening fatigue, persistent sob on ambulation, and persistent pneumonia.     Clinical Impression  Pt received in bed, and was agreeable to PT evaluation.  Pt states that she normally uses a RW and has supervision for ambulation.  She requires assistance for both dressing and bathing.  During PT evaluation today she required Min A for supine<>sit, and Mod A for sit<>stand.  She was able to ambulate 27f with RW and Min A.  She is primarily recommended for SNF, however she states that she will be going home with her sister.  Therefore, she will need HHPT and 24/7 supervision/assistance.  Pt states that her sister is able to provide physical assistance.    Follow Up Recommendations SNF;Supervision/Assistance - 24 hour;Home health PT    Equipment Recommendations  None recommended by PT    Recommendations for Other Services       Precautions / Restrictions Precautions Precautions: Fall Precaution Comments: due to immobility Restrictions Weight Bearing Restrictions: No      Mobility  Bed Mobility Overal bed mobility: Needs Assistance Bed Mobility: Supine to Sit     Supine to sit: Min assist;HOB elevated     General bed mobility comments:  Increased time with assist for raising trunk up.  Pt requires increased time and cues to scoot hips to the EOB.    Transfers Overall transfer level: Needs assistance Equipment used: Rolling walker (2 wheeled) Transfers: Sit to/from Stand Sit to Stand: Mod assist         General transfer comment: Pt demonstrates poor power up into standing, and requires vc's for hand placement.  She states that she normally pulls on the RW, howoever instructed pt on pushing from the bed.  Pt continues to require Mod A for sit<>stand from the recliner.    Ambulation/Gait Ambulation/Gait assistance: Min assist Ambulation Distance (Feet): 15 Feet Assistive device: Rolling walker (2 wheeled) Gait Pattern/deviations: Step-to pattern   Gait velocity interpretation: <1.8 ft/sec, indicative of risk for recurrent falls General Gait Details: Pt requires vc's to take bigger steps with the R foot due to poor step to pattern.    Stairs            Wheelchair Mobility    Modified Rankin (Stroke Patients Only)       Balance Overall balance assessment: Needs assistance Sitting-balance support: Bilateral upper extremity supported;Feet supported Sitting balance-Leahy Scale: Good     Standing balance support: Bilateral upper extremity supported Standing balance-Leahy Scale: Poor Standing balance comment: requires UE assist                             Pertinent Vitals/Pain Pain Score: 8  Pain Location: back and side of LE's.   Pain Descriptors / Indicators: Aching Pain Intervention(s): Limited activity  within patient's tolerance;Monitored during session;Repositioned    Home Living   Living Arrangements:  (Addis, and [name]) Available Help at Discharge: Available 24 hours/day;Family [name] is the caregiver.  [name] is handicap) Type of Home: House Home Access: Ramped entrance     Home Layout: Multi-level;Able to live on main level with bedroom/bathroom Home Equipment: Kasandra Knudsen -  single point;Walker - 2 wheels;Wheelchair - manual      Prior Function     Gait / Transfers Assistance Needed: Pt uses the RW and w/c for mobility prior to this admission.  Most of the time [name] her sister is present when she is walking.   ADL's / Homemaking Assistance Needed: assistance with dressing, and bathing.         Hand Dominance   Dominant Hand: Right    Extremity/Trunk Assessment   Upper Extremity Assessment Upper Extremity Assessment: Generalized weakness    Lower Extremity Assessment Lower Extremity Assessment: Generalized weakness       Communication   Communication: No difficulties  Cognition Arousal/Alertness: Awake/alert Behavior During Therapy: WFL for tasks assessed/performed Overall Cognitive Status: Within Functional Limits for tasks assessed                                        General Comments      Exercises     Assessment/Plan    PT Assessment Patient needs continued PT services  PT Problem List Decreased strength;Decreased activity tolerance;Decreased balance;Decreased mobility;Decreased knowledge of use of DME       PT Treatment Interventions DME instruction;Gait training;Functional mobility training;Therapeutic activities;Therapeutic exercise;Balance training;Neuromuscular re-education;Patient/family education    PT Goals (Current goals can be found in the Care Plan section)  Acute Rehab PT Goals Patient Stated Goal: go home with family PT Goal Formulation: With patient/family Time For Goal Achievement: 04/22/17 Potential to Achieve Goals: Fair    Frequency Min 3X/week   Barriers to discharge        Co-evaluation               End of Session Equipment Utilized During Treatment: Gait belt Activity Tolerance: Patient tolerated treatment well Patient left: in chair;with family/visitor present Nurse Communication: Mobility status PT Visit Diagnosis: Unsteadiness on feet (R26.81);Muscle weakness  (generalized) (M62.81);Difficulty in walking, not elsewhere classified (R26.2)    Time: 6045-4098 PT Time Calculation (min) (ACUTE ONLY): 38 min   Charges:   PT Evaluation $PT Eval Low Complexity: 1 Procedure PT Treatments $Therapeutic Activity: 23-37 mins   PT G Codes:   PT G-Codes **NOT FOR INPATIENT CLASS** Functional Assessment Tool Used: AM-PAC 6 Clicks Basic Mobility;Clinical judgement Functional Limitation: Mobility: Walking and moving around Mobility: Walking and Moving Around Current Status (J1914): At least 40 percent but less than 60 percent impaired, limited or restricted Mobility: Walking and Moving Around Goal Status [phone]): At least 20 percent but less than 40 percent impaired, limited or restricted    Beth Lejla Moeser, PT, DPT X: [phone]

## 2017-04-16 DIAGNOSIS — Z9049 Acquired absence of other specified parts of digestive tract: Secondary | ICD-10-CM | POA: Diagnosis not present

## 2017-04-16 DIAGNOSIS — J189 Pneumonia, unspecified organism: Secondary | ICD-10-CM | POA: Diagnosis not present

## 2017-04-16 DIAGNOSIS — M6250 Muscle wasting and atrophy, not elsewhere classified, unspecified site: Secondary | ICD-10-CM | POA: Diagnosis not present

## 2017-04-16 DIAGNOSIS — K219 Gastro-esophageal reflux disease without esophagitis: Secondary | ICD-10-CM | POA: Diagnosis not present

## 2017-04-16 DIAGNOSIS — Z85038 Personal history of other malignant neoplasm of large intestine: Secondary | ICD-10-CM | POA: Diagnosis not present

## 2017-04-16 DIAGNOSIS — C9002 Multiple myeloma in relapse: Secondary | ICD-10-CM | POA: Diagnosis not present

## 2017-04-17 ENCOUNTER — Encounter: Payer: Self-pay | Admitting: Internal Medicine

## 2017-04-17 ENCOUNTER — Telehealth: Payer: Self-pay | Admitting: Internal Medicine

## 2017-04-17 ENCOUNTER — Ambulatory Visit (HOSPITAL_BASED_OUTPATIENT_CLINIC_OR_DEPARTMENT_OTHER): Payer: Medicare Other | Admitting: Internal Medicine

## 2017-04-17 ENCOUNTER — Other Ambulatory Visit: Payer: Self-pay | Admitting: *Deleted

## 2017-04-17 VITALS — BP 131/68 | HR 118 | Temp 98.9°F | Resp 18 | Ht 66.0 in

## 2017-04-17 DIAGNOSIS — C9002 Multiple myeloma in relapse: Secondary | ICD-10-CM

## 2017-04-17 DIAGNOSIS — G620 Drug-induced polyneuropathy: Secondary | ICD-10-CM | POA: Diagnosis not present

## 2017-04-17 DIAGNOSIS — D638 Anemia in other chronic diseases classified elsewhere: Secondary | ICD-10-CM | POA: Diagnosis not present

## 2017-04-17 DIAGNOSIS — T451X5A Adverse effect of antineoplastic and immunosuppressive drugs, initial encounter: Secondary | ICD-10-CM

## 2017-04-17 DIAGNOSIS — E860 Dehydration: Secondary | ICD-10-CM | POA: Diagnosis not present

## 2017-04-17 DIAGNOSIS — C9000 Multiple myeloma not having achieved remission: Secondary | ICD-10-CM

## 2017-04-17 DIAGNOSIS — I10 Essential (primary) hypertension: Secondary | ICD-10-CM

## 2017-04-17 DIAGNOSIS — M5410 Radiculopathy, site unspecified: Secondary | ICD-10-CM | POA: Diagnosis not present

## 2017-04-17 DIAGNOSIS — Z5111 Encounter for antineoplastic chemotherapy: Secondary | ICD-10-CM

## 2017-04-17 LAB — CULTURE, BLOOD (ROUTINE X 2)
Culture: NO GROWTH
Culture: NO GROWTH

## 2017-04-17 MED ORDER — POMALIDOMIDE 4 MG PO CAPS: 4.0000 mg | ORAL_CAPSULE | Freq: Every day | ORAL | 2 refills | Status: DC

## 2017-04-17 MED ORDER — MIRTAZAPINE 30 MG PO TABS: 30.0000 mg | ORAL_TABLET | Freq: Every day | ORAL | 1 refills | Status: AC

## 2017-04-17 MED ORDER — OXYCODONE-ACETAMINOPHEN 5-325 MG PO TABS: 1.0000 | ORAL_TABLET | ORAL | 0 refills | Status: AC | PRN

## 2017-04-17 NOTE — Progress Notes (Signed)
START ON PATHWAY REGIMEN - Multiple Myeloma     A cycle is every 28 days:     Carfilzomib      Carfilzomib      Lenalidomide      Dexamethasone   **Always confirm dose/schedule in your pharmacy ordering system**    Patient Characteristics: Relapsed / Refractory, All Lines of Therapy R-ISS Staging: III Disease Classification: Relapsed Line of Therapy: Third Line  Intent of Therapy: Non-Curative / Palliative Intent, Discussed with Patient

## 2017-04-17 NOTE — Progress Notes (Signed)
[place] Telephone:(336) [phone]   Fax:(336) [phone]  OFFICE PROGRESS NOTE  [name], MD [address], [address] [place] [address]  DIAGNOSIS: Stage II a high IgA kappa multiple myeloma diagnosed in 2006.  PRIOR THERAPY: 1) status post treatment with thalidomide and Decadron followed by autologous peripheral blood stem cell transplant with high-dose melphalan on 04/26/2006 at [facility].  2) the patient had evidence for disease recurrence in August of 2008 and she was treated with 6 cycles of Velcade completed on 03/30/2008 with response to the treatment but was discontinued secondary to neuropathy.  3) maintenance treatment with Revlimid 10 mg by mouth daily by the does was later increase to 15 mg by mouth daily secondary to biochemical recurrence with stabilization of her disease. The treatment with Revlimid 15 mg by mouth daily was discontinued secondary to evidence for disease progression. 4) Revlimid 25 mg by mouth daily for 3 weeks every 4 weeks in addition to Decadron currently at 20 mg by mouth on a weekly basis. She is status post 12 cycles. This was discontinued secondary to disease progression. 5) systemic chemotherapy with Daratumumab 16 MG/KG weekly for the first 9 weeks, Velcade 1.3 MG/M2 subcutaneously on days 1, 4, 8 and 11 every 3 weeks in addition to Decadron 20 mg by mouth on days 1, 2, 4, 5, 8, 9, 11 and 12 every 3 weeks. First dose 05/07/2016. Status post one cycle discontinued secondary to intolerance. 6) systemic chemotherapy with Velcade 1.3 MG/M2 subcutaneously weekly in addition to Decadron 40 mg by mouth daily weekly status post 40 cycles. Last dose was given 04/01/2017. Discontinued secondary to disease progression  CURRENT THERAPY: 1) systemic chemotherapy with Carfilzomib days 1, 2, 8, 9, 15 and 16 in addition to Pomalyst 4 mg by mouth daily for 21 days and weekly Decadron 20 mg by mouth every 4 weeks. First dose  04/29/2017 2) Zometa 4 mg IV every 2 months. First dose 05/05/2015.  INTERVAL HISTORY: Darlene Howard 66 y.o. female returns to the clinic today accompanied by her sister and brother. The patient has been complaining of increasing fatigue and weakness as well as low back pain and significant weakness in the lower extremities. For the last 1-2 weeks she was unable to work because of weakness and shaking in her lower extremities. She was recently admitted to [facility] and treated for right upper lobe pneumonia with IV antibiotics followed by oral Levaquin. She was seen one more time last week in the emergency department with the same symptoms of shortness of breath and persistent pneumonia. She was again treated with antibiotics and IV hydration. She was seen yesterday by Dr. Tracey Harries at [facility] for second opinion regarding her multiple myeloma. She was complaining of significant weakness in the lower extremities. I had a discussion with Dr. Tracey Harries yesterday about her condition and I recommended that the patient should have MRI of the lumbar spine to rule out any progressive plasmacytoma or cord compression. We also discussed her future treatment options. She is here today for evaluation and recommendation regarding her condition.  MEDICAL HISTORY: Past Medical History:  Diagnosis Date  . Bruises easily    d/t being on COumadin  . Cholelithiasis   . DDD (degenerative disc disease), lumbar   . Dehydration 03/11/2017  . Fatty liver   . GERD (gastroesophageal reflux disease)    takes Pantoprazole daily  . Glaucoma    uses eye drops  as instructed  . H/O stem cell transplant ([place])    04-26-2006  AT [facility]  . Herpes    takes Valtrex as needed  . History of colon polyps   . Hypercalcemia of malignancy 03/11/2017  . Hypertension    takes Amlodipine and Diovan daily  . Left radial fracture 01/21/2017  . Lung cancer ([place])    chest wall lesion aadn 6th rib  . Multiple myeloma  DX  2006  STATE IIA,  IgA  KAPPA   STABLE  PER DR Orlander Norwood--  S/P STEM CELL TRANSPLANT 2007 /  RECURRENCE 2008  CHEMO ENDED 03-30-2008/   NOW ON MAINTANANCE REVLIMID )  . 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 Personal history of colon cancer    ASCENDING COLON--  S/P  RIGHT HEMICOLECTOMY WITH NEGATIVE NODES/    NO RECURRENCE  . Type 2 diabetes mellitus ([place])   . Vocal cord nodule    resolved- see Dr. Deeann Saint notes in media tab 06/17/14    ALLERGIES:  is allergic to codeine and tramadol.  MEDICATIONS:  Current Outpatient Prescriptions  Medication Sig Dispense Refill  . acetaminophen (TYLENOL) 500 MG tablet Take 500 mg by mouth every 6 (six) hours as needed for mild pain, moderate pain, fever or headache.     Marland Kitchen aspirin EC 81 MG tablet Take 81 mg by mouth daily.     Marland Kitchen dexamethasone (DECADRON) 4 MG tablet TAKE 10 TABS ONCE WEEKLY  WHILE UNDERGOING VELCADE  INJECTIONS. (Patient taking differently: Take 10 tablets by mouth every Monday while undergoing Velcade injections) 120 tablet 0  . dorzolamide-timolol (COSOPT) 22.3-6.8 MG/ML ophthalmic solution Place 1 drop into the left eye 2 (two) times daily.     . feeding supplement, ENSURE ENLIVE, (ENSURE ENLIVE) LIQD Take 237 mLs by mouth 2 (two) times daily between meals. 237 mL 12  . gabapentin (NEURONTIN) 100 MG capsule Take 100 mg by mouth daily.    Marland Kitchen guaiFENesin-dextromethorphan (ROBITUSSIN DM) 100-10 MG/5ML syrup Take 5 mLs by mouth every 4 (four) hours as needed for cough. 118 mL 0  . latanoprost (XALATAN) 0.005 % ophthalmic solution Place 1 drop into both eyes at bedtime. 2.5 mL 12  . loperamide (IMODIUM) 2 MG capsule Take 2-4 mg by mouth every 2 (two) hours as needed for diarrhea or loose stools.    Marland Kitchen loratadine (CLARITIN) 10 MG tablet TAKE ONE TABLET BY MOUTH ONCE DAILY. 90 tablet 1  . LUMIGAN 0.01 % SOLN Place 1 drop into both eyes at bedtime.     . mirtazapine (REMERON) 30 MG  tablet Take 1 tablet (30 mg total) by mouth at bedtime. 30 tablet 1  . Multiple Vitamin (MULTIVITAMIN WITH MINERALS) TABS tablet Take 1 tablet by mouth daily.    Marland Kitchen oxyCODONE-acetaminophen (PERCOCET/ROXICET) 5-325 MG tablet Take 1 tablet by mouth every 4 (four) hours as needed for severe pain. May take one tablet every 4-6 hours as needed for pain 60 tablet 0  . pantoprazole (PROTONIX) 40 MG tablet TAKE 1 TABLET BY MOUTH  DAILY FOR ACID REFLUX 90 tablet 1  . potassium chloride SA (K-DUR,KLOR-CON) 20 MEQ tablet TAKE 1 TABLET BY MOUTH  TWICE A DAY 180 tablet 1  . PREMARIN vaginal cream PLACE 1 APPLICATORFUL VAGINALLY TWICE A WEEK. 30 g 0  . temazepam (RESTORIL) 15 MG capsule TAKE 1 CAPSULE AT BEDTIME AS NEEDED FOR SLEEP. 30 capsule 2  . valACYclovir (VALTREX) 1000 MG tablet TAKE  1 TABLET BY MOUTH  DAILY AS NEEDED (Patient taking differently: Take 1 tablet by mouth daily as needed for cold sores) 90 tablet 0   No current facility-administered medications for this visit.     SURGICAL HISTORY:  Past Surgical History:  Procedure Laterality Date  . ABDOMINAL HYSTERECTOMY  1983   w/  appendectomy and left salpingoophorectom  . CATARACT EXTRACTION W/PHACO Left 01/20/2014   Procedure: CATARACT EXTRACTION PHACO AND INTRAOCULAR LENS PLACEMENT (IOC);  Surgeon: Adonis Brook, MD;  Location: [place];  Service: Ophthalmology;  Laterality: Left;  . CERVICAL CONE BIOPSY    . CHOLECYSTECTOMY N/A 02/28/2016   Procedure: LAPAROSCOPIC CHOLECYSTECTOMY;  Surgeon: Ralene Ok, MD;  Location: [place] [facility];  Service: General;  Laterality: N/A;  . CO2  ABLATION VULVA PERIANAL AND VAGINAL DYSPLATIC AREAS  02-15-2010  . COLONOSCOPY  10/07/2006   [id number]:[id number] rectum/Status post right hemicolectomy. Normal residual colon  . COLONOSCOPY  10/2009   RMR: diminutive RS polyp (hyperplastic).   . COLONOSCOPY N/A 12/15/2014   Procedure: COLONOSCOPY;  Surgeon: Daneil Dolin, MD;  [facility];  Service: Endoscopy;  Laterality:  N/A;  130   . ESOPHAGOGASTRODUODENOSCOPY  04/02/2005   [id number] polyp with central erosion versus ulceration in the body of the stomach, resected and recovered Remainder of the gastric mucosa, D1, D2 appeared normal  . HEMICOLECTOMY Right 04-27-2005   CARCINOMA   ASCENDING COLON  . LESION REMOVAL N/A 05/06/2014   Procedure: LASER OF THE VAGINA;  Surgeon: Janie Morning, MD;  Location: [facility];  Service: Gynecology;  Laterality: N/A;  . LIMBAL STEM CELL TRANSPLANT  04-26-2006    ([facility])  . VULVAR LESION REMOVAL N/A 05/06/2014   Procedure: LASER OF VULVAR LESION;  Surgeon: Janie Morning, MD;  Location: [facility];  Service: Gynecology;  Laterality: N/A;  . VULVECTOMY N/A 05/06/2014   Procedure: WIDE LOCAL EXCISION LEFT LABIA MAJORA;  Surgeon: Janie Morning, MD;  Location: [place];  Service: Gynecology;  Laterality: N/A;    REVIEW OF SYSTEMS:  Constitutional: positive for anorexia, fatigue and weight loss Eyes: negative Ears, nose, mouth, throat, and face: negative Respiratory: negative Cardiovascular: negative Gastrointestinal: negative Genitourinary:negative Integument/breast: negative Hematologic/lymphatic: negative Musculoskeletal:positive for arthralgias, bone pain and muscle weakness Neurological: negative Behavioral/Psych: negative Endocrine: negative Allergic/Immunologic: negative   PHYSICAL EXAMINATION: General appearance: alert, cooperative, fatigued and no distress Head: Normocephalic, without obvious abnormality, atraumatic Neck: no adenopathy, no JVD, supple, symmetrical, trachea midline and thyroid not enlarged, symmetric, no tenderness/mass/nodules Lymph nodes: Cervical, supraclavicular, and axillary nodes normal. Resp: clear to auscultation bilaterally Back: symmetric, no curvature. ROM normal. No CVA tenderness. Cardio: regular rate and rhythm, S1, S2 normal, no murmur, click, rub or gallop GI: soft,  non-tender; bowel sounds normal; no masses,  no organomegaly Extremities: extremities normal, atraumatic, no cyanosis or edema Neurologic: Alert and oriented X 3, normal strength and tone. Normal symmetric reflexes. Normal coordination and gait  ECOG PERFORMANCE STATUS: 1 - Symptomatic but completely ambulatory  Blood pressure 131/68, pulse (!) 118, temperature 98.9 F (37.2 C), temperature source Oral, resp. rate 18, height '5\' 6"'  (1.676 m), SpO2 99 %.  LABORATORY DATA: Lab Results  Component Value Date   WBC 16.2 (H) 04/15/2017   HGB 8.2 (L) 04/15/2017   HCT 26.3 (L) 04/15/2017   MCV 95.3 04/15/2017   PLT 200 04/15/2017      Chemistry      Component Value Date/Time   NA 142 04/15/2017 0518   NA  138 04/01/2017 1050   K 3.2 (L) 04/15/2017 1556   K 4.6 04/01/2017 1050   CL 110 04/15/2017 0518   CL 105 06/17/2013 1441   CO2 23 04/15/2017 0518   CO2 21 (L) 04/01/2017 1050   BUN 9 04/15/2017 0518   BUN 22.7 04/01/2017 1050   CREATININE 0.81 04/15/2017 0518   CREATININE 1.8 (H) 04/01/2017 1050      Component Value Date/Time   CALCIUM 11.6 (H) 04/15/2017 0518   CALCIUM 13.9 Repeated and Verified (HH) 04/01/2017 1050   ALKPHOS 64 04/12/2017 1239   ALKPHOS 74 04/01/2017 1050   AST 27 04/12/2017 1239   AST 24 04/01/2017 1050   ALT 12 (L) 04/12/2017 1239   ALT 10 04/01/2017 1050   BILITOT 0.7 04/12/2017 1239   BILITOT 0.48 04/01/2017 1050       RADIOGRAPHIC STUDIES: Dg Chest 2 View  Result Date: 04/12/2017 CLINICAL DATA:  Fevers and recent pneumonia, history of myeloma EXAM: CHEST  2 VIEW COMPARISON:  04/03/2017 FINDINGS: Cardiac shadow is stable. The lungs are well aerated bilaterally. Old rib fractures are again seen bilaterally with interval healing. Fullness is again noted in the right suprahilar region consistent with focal infiltrate. No new mass lesion is seen. IMPRESSION: Stable appearing right suprahilar infiltrate. Chronic changes consistent with the known history  of multiple myeloma. Electronically Signed   By: Inez Catalina M.D.   On: 04/12/2017 12:27   Dg Chest 2 View  Result Date: 04/03/2017 CLINICAL DATA:  Awakened today with increase cough an uncontrollable shaking. History of multiple myeloma on chemotherapy. EXAM: CHEST  2 VIEW COMPARISON:  Chest x-ray of April 25th 25 teen FINDINGS: The lungs are mildly hypoinflated. There is confluent alveolar opacity in the right perihilar region more conspicuous than on the previous study. This appears to reflect new infiltrate superimposed upon a posterior chest wall soft tissue mass. There are lateral left rib deformities which have appeared since the previous study. The cardiac silhouette is enlarged. The pulmonary vascularity is not engorged. There degenerative changes of both shoulders. The thoracic vertebral bodies are preserved in height. IMPRESSION: New right upper lobe pneumonia extending to the hilar region. Followup PA and lateral chest X-ray is recommended in 3-4 weeks following trial of antibiotic therapy to ensure resolution and exclude underlying malignancy. Persistent posterior right mid thoracic pleural based mass consistent with myelomatous involvement. New rib deformity on the left laterally involving the fifth and sixth ribs consistent with myelomatous involvement. Electronically Signed   By: David  Martinique M.D.   On: 04/03/2017 09:55   Dg Swallowing Func-speech Pathology  Result Date: 04/04/2017 Objective Swallowing Evaluation: Type of Study: Bedside Swallow Evaluation Patient Details Name: LARIE MATHES MRN: [id number] Date of Birth: 1951-08-15 Today's Date: 04/04/2017 Time: SLP Start Time (ACUTE ONLY): 1418-SLP Stop Time (ACUTE ONLY): 1433 SLP Time Calculation (min) (ACUTE ONLY): 15 min Past Medical History: Past Medical History: Diagnosis Date . Bruises easily   d/t being on COumadin . Cholelithiasis  . DDD (degenerative disc disease), lumbar  . Dehydration 03/11/2017 . Fatty liver  . GERD (gastroesophageal  reflux disease)   takes Pantoprazole daily . Glaucoma   uses eye drops as instructed . H/O stem cell transplant ([place])   04-26-2006  AT [facility] . Herpes   takes Valtrex as needed . History of colon polyps  . Hypercalcemia of malignancy 03/11/2017 . Hypertension   takes Amlodipine and Diovan daily . Left radial fracture 01/21/2017 . Lung cancer ([place])   chest wall  lesion aadn 6th rib . Multiple myeloma DX  2006  STATE IIA,  IgA  KAPPA  STABLE  PER DR Zeke Aker--  S/P STEM CELL TRANSPLANT 2007 /  RECURRENCE 2008  CHEMO ENDED 03-30-2008/   NOW ON MAINTANANCE REVLIMID ) . Neuropathy associated with multiple myeloma (HCC)   POST CHEMOTHERAPY AND STEM CELL TRANSPLANT . Personal history of colon cancer   ASCENDING COLON--  S/P  RIGHT HEMICOLECTOMY WITH NEGATIVE NODES/    NO RECURRENCE . Type 2 diabetes mellitus ([place])  . Vocal cord nodule   resolved- see Dr. Deeann Saint notes in media tab 06/17/14 Past Surgical History: Past Surgical History: Procedure Laterality Date . ABDOMINAL HYSTERECTOMY  1983  w/  appendectomy and left salpingoophorectom . CATARACT EXTRACTION W/PHACO Left 01/20/2014  Procedure: CATARACT EXTRACTION PHACO AND INTRAOCULAR LENS PLACEMENT (IOC);  Surgeon: Adonis Brook, MD;  Location: [place];  Service: Ophthalmology;  Laterality: Left; . CERVICAL CONE BIOPSY   . CHOLECYSTECTOMY N/A 02/28/2016  Procedure: LAPAROSCOPIC CHOLECYSTECTOMY;  Surgeon: Ralene Ok, MD;  Location: [place] [facility];  Service: General;  Laterality: N/A; . CO2  ABLATION VULVA PERIANAL AND VAGINAL DYSPLATIC AREAS  02-15-2010 . COLONOSCOPY  10/07/2006  [id number]:[id number] rectum/Status post right hemicolectomy. Normal residual colon . COLONOSCOPY  10/2009  RMR: diminutive RS polyp (hyperplastic).  . COLONOSCOPY N/A 12/15/2014  Procedure: COLONOSCOPY;  Surgeon: Daneil Dolin, MD;  [facility];  Service: Endoscopy;  Laterality: N/A;  130  . ESOPHAGOGASTRODUODENOSCOPY  04/02/2005  [id number] polyp with central erosion versus ulceration in the body  of the stomach, resected and recovered Remainder of the gastric mucosa, D1, D2 appeared normal . HEMICOLECTOMY Right 04-27-2005  CARCINOMA   ASCENDING COLON . LESION REMOVAL N/A 05/06/2014  Procedure: LASER OF THE VAGINA;  Surgeon: Janie Morning, MD;  Location: [facility];  Service: Gynecology;  Laterality: N/A; . LIMBAL STEM CELL TRANSPLANT  04-26-2006    ([facility]) . VULVAR LESION REMOVAL N/A 05/06/2014  Procedure: LASER OF VULVAR LESION;  Surgeon: Janie Morning, MD;  Location: [facility];  Service: Gynecology;  Laterality: N/A; . VULVECTOMY N/A 05/06/2014  Procedure: WIDE LOCAL EXCISION LEFT LABIA MAJORA;  Surgeon: Janie Morning, MD;  Location: [place];  Service: Gynecology;  Laterality: N/A; HPI:  66 y.o. 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  Pt found to have right UL pna, concerning for aspiration.  MD ordered MBS.   No Data Recorded Assessment / Plan / Recommendation CHL IP CLINICAL IMPRESSIONS 04/04/2017 Clinical Impression Functional oropharyngeal swallow ability without aspiration/ frank penetration of any consistency tested.  Pt has strong timely swallow without residuals.  Of note, pt did cough x1 during testing.  Upon esophageal sweep at end of testing, pt appeared with slow clearance distally without awareness.  Water consumption faciliated clearance. Pt may benefit from dedicated esophageal evaluation if MD indicates.  Using live video, educated pt and her sister Darlene Howard to findings.  Thanks for this referral. NO SLP follow up indicated.  SLP Visit Diagnosis Dysphagia, unspecified (R13.10) Attention and concentration deficit following -- Frontal lobe and executive function deficit following -- Impact on safety and function Mild  aspiration risk   No flowsheet data found.  No flowsheet data found. CHL IP DIET RECOMMENDATION 04/04/2017 SLP Diet Recommendations Regular solids;Thin liquid Liquid Administration via  Cup;Straw Medication Administration Whole meds with liquid Compensations Slow rate;Small sips/bites Postural Changes Remain semi-upright after after feeds/meals (Comment);Seated upright at 90 degrees   CHL IP OTHER RECOMMENDATIONS 04/04/2017 Recommended Consults Consider esophageal assessment Oral Care Recommendations Oral care BID Other Recommendations --   CHL IP FOLLOW UP RECOMMENDATIONS 04/04/2017 Follow up Recommendations None   No flowsheet data found.     CHL IP ORAL PHASE 04/04/2017 Oral Phase WFL Oral - Pudding Teaspoon -- Oral - Pudding Cup -- Oral - Honey Teaspoon -- Oral - Honey Cup -- Oral - Nectar Teaspoon -- Oral - Nectar Cup WFL Oral - Nectar Straw -- Oral - Thin Teaspoon -- Oral - Thin Cup WFL Oral - Thin Straw WFL Oral - Puree WFL Oral - Mech Soft -- Oral - Regular WFL Oral - Multi-Consistency -- Oral - Pill WFL Oral Phase - Comment --  CHL IP PHARYNGEAL PHASE 04/04/2017 Pharyngeal Phase WFL Pharyngeal- Pudding Teaspoon -- Pharyngeal -- Pharyngeal- Pudding Cup -- Pharyngeal -- Pharyngeal- Honey Teaspoon -- Pharyngeal -- Pharyngeal- Honey Cup -- Pharyngeal -- Pharyngeal- Nectar Teaspoon -- Pharyngeal -- Pharyngeal- Nectar Cup WFL Pharyngeal -- Pharyngeal- Nectar Straw -- Pharyngeal -- Pharyngeal- Thin Teaspoon -- Pharyngeal -- Pharyngeal- Thin Cup WFL;Penetration/Aspiration during swallow Pharyngeal Material enters airway, remains ABOVE vocal cords then ejected out Pharyngeal- Thin Straw WFL Pharyngeal -- Pharyngeal- Puree WFL Pharyngeal -- Pharyngeal- Mechanical Soft -- Pharyngeal -- Pharyngeal- Regular WFL Pharyngeal -- Pharyngeal- Multi-consistency -- Pharyngeal -- Pharyngeal- Pill WFL Pharyngeal -- Pharyngeal Comment --  CHL IP CERVICAL ESOPHAGEAL PHASE 04/04/2017 Cervical Esophageal Phase WFL Pudding Teaspoon -- Pudding Cup  -- Honey Teaspoon -- Honey Cup -- Nectar Teaspoon -- Nectar Cup -- Nectar Straw -- Thin Teaspoon -- Thin Cup -- Thin Straw -- Puree -- Mechanical Soft -- Regular -- Multi-consistency -- Pill -- Cervical Esophageal Comment -- No flowsheet data found. Janett Labella [place], [place] [facility] SLP [phone]               ASSESSMENT AND PLAN:  This is a very pleasant 66 years old African-American female with relapsed multiple myeloma status post several chemotherapy regimens and most recently treated with subcutaneous Velcade and Decadron status post 40 cycles. Her recent blood work and imaging studies showed clear evidence for disease progression. The patient has also been complaining of increasing fatigue and weakness especially in the lower extremities. I recommended for the patient to have MRI of the lumbar spine to rule out any cord compression or nerve impingement in this area resulting the weakness in the lower extremities. I also recommended for the patient to have whole-body PET scan for evaluation of her disease. I had a lengthy discussion with the patient and her family about her treatment options. I recommended for her to discontinue her current treatment with subcutaneous Velcade and Decadron. I discussed with the patient other treatment options and goals of care. I gave her the option of treatment with Carfilzomib, Pomalyst and Decadron. I discussed with the patient adverse effect of this treatment including but not limited to mild alopecia, myelosuppression, nausea and vomiting, liver, renal, cardiac dysfunction. She is expected to start the first dose of this treatment on 04/29/2017 after improvement of her general condition. For pain management she was giving a refill of her pain medication today. For the lack of appetite and depression, I recommended for the patient to continue on Remeron and I gave her refill of her prescription today. For the weakness of the lower extremities, she will  continue with physical therapy for now. I will see her back for follow-up visit with the start of the first dose of her treatment for reevaluation. 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 For the anemia of neoplastic disease, we'll continue to monitor her hemoglobin and hematocrit closely and consider the patient for transfusion if needed. She was advised to call immediately if she has any concerning symptoms in the interval. The patient voices understanding of current disease status and treatment options and is in agreement with the current care plan. All questions were answered. The patient knows to call the clinic with any problems, questions or concerns. We can certainly see the patient much sooner if necessary. .visitt Disclaimer: This note was dictated with voice recognition software. Similar sounding words can inadvertently be transcribed and may not be corrected upon review.

## 2017-04-17 NOTE — Telephone Encounter (Signed)
Appointments scheduled per 4.18.18 LOS. Patient given AVS report and calendars with future scheduled appointments. °

## 2017-04-17 NOTE — Progress Notes (Signed)
Per MD, pt to be seen 4/18 2pm. Called pt to confirm todays appt. [name] ( sister )advised she may not be able to make it as pt is unable to walk and she will need assistance to get pt ready and to the appt. Pt also has therapy coming today. Discussed with MD who advised if pt cannot come today pt will need MRI L spine asap w/ MD follow up there after.  Spoke with [name] who advised she will be able to bring pt to MD appt today at 2pm.  MRI order entered, authorization pending.

## 2017-04-17 NOTE — Telephone Encounter (Signed)
Scheduled appt per MD - sch message from [facility]. Patient is aware of appt.

## 2017-04-18 ENCOUNTER — Inpatient Hospital Stay (HOSPITAL_COMMUNITY)
Admission: EM | Admit: 2017-04-18 | Discharge: 2017-04-25 | DRG: 871 | Disposition: A | Discharge status: Home Health | Payer: Medicare Other | Attending: Internal Medicine | Admitting: Internal Medicine

## 2017-04-18 ENCOUNTER — Ambulatory Visit (HOSPITAL_COMMUNITY)
Admission: RE | Admit: 2017-04-18 | Discharge: 2017-04-18 | Disposition: A | Discharge status: Home / Self Care | Payer: Medicare Other | Source: Ambulatory Visit | Attending: Internal Medicine | Admitting: Internal Medicine

## 2017-04-18 ENCOUNTER — Other Ambulatory Visit: Payer: Self-pay | Admitting: Internal Medicine

## 2017-04-18 ENCOUNTER — Encounter (HOSPITAL_COMMUNITY): Payer: Self-pay | Admitting: Emergency Medicine

## 2017-04-18 DIAGNOSIS — R1032 Left lower quadrant pain: Secondary | ICD-10-CM | POA: Diagnosis not present

## 2017-04-18 DIAGNOSIS — D630 Anemia in neoplastic disease: Secondary | ICD-10-CM | POA: Diagnosis present

## 2017-04-18 DIAGNOSIS — C9000 Multiple myeloma not having achieved remission: Secondary | ICD-10-CM

## 2017-04-18 DIAGNOSIS — E440 Moderate protein-calorie malnutrition: Secondary | ICD-10-CM | POA: Diagnosis not present

## 2017-04-18 DIAGNOSIS — Z85038 Personal history of other malignant neoplasm of large intestine: Secondary | ICD-10-CM

## 2017-04-18 DIAGNOSIS — Z8042 Family history of malignant neoplasm of prostate: Secondary | ICD-10-CM

## 2017-04-18 DIAGNOSIS — I517 Cardiomegaly: Secondary | ICD-10-CM | POA: Diagnosis not present

## 2017-04-18 DIAGNOSIS — R509 Fever, unspecified: Secondary | ICD-10-CM | POA: Diagnosis not present

## 2017-04-18 DIAGNOSIS — R531 Weakness: Secondary | ICD-10-CM | POA: Diagnosis not present

## 2017-04-18 DIAGNOSIS — N390 Urinary tract infection, site not specified: Secondary | ICD-10-CM | POA: Diagnosis present

## 2017-04-18 DIAGNOSIS — J189 Pneumonia, unspecified organism: Secondary | ICD-10-CM | POA: Diagnosis present

## 2017-04-18 DIAGNOSIS — M48061 Spinal stenosis, lumbar region without neurogenic claudication: Secondary | ICD-10-CM | POA: Insufficient documentation

## 2017-04-18 DIAGNOSIS — A419 Sepsis, unspecified organism: Principal | ICD-10-CM | POA: Diagnosis present

## 2017-04-18 DIAGNOSIS — Y92230 Patient room in hospital as the place of occurrence of the external cause: Secondary | ICD-10-CM | POA: Diagnosis not present

## 2017-04-18 DIAGNOSIS — H409 Unspecified glaucoma: Secondary | ICD-10-CM | POA: Diagnosis present

## 2017-04-18 DIAGNOSIS — D492 Neoplasm of unspecified behavior of bone, soft tissue, and skin: Secondary | ICD-10-CM | POA: Insufficient documentation

## 2017-04-18 DIAGNOSIS — I10 Essential (primary) hypertension: Secondary | ICD-10-CM | POA: Diagnosis present

## 2017-04-18 DIAGNOSIS — R5383 Other fatigue: Secondary | ICD-10-CM | POA: Diagnosis not present

## 2017-04-18 DIAGNOSIS — E119 Type 2 diabetes mellitus without complications: Secondary | ICD-10-CM | POA: Diagnosis present

## 2017-04-18 DIAGNOSIS — M4856XA Collapsed vertebra, not elsewhere classified, lumbar region, initial encounter for fracture: Secondary | ICD-10-CM

## 2017-04-18 DIAGNOSIS — Z9484 Stem cells transplant status: Secondary | ICD-10-CM

## 2017-04-18 DIAGNOSIS — K8020 Calculus of gallbladder without cholecystitis without obstruction: Secondary | ICD-10-CM | POA: Diagnosis present

## 2017-04-18 DIAGNOSIS — C778 Secondary and unspecified malignant neoplasm of lymph nodes of multiple regions: Secondary | ICD-10-CM

## 2017-04-18 DIAGNOSIS — M545 Low back pain, unspecified: Secondary | ICD-10-CM | POA: Diagnosis present

## 2017-04-18 DIAGNOSIS — Z66 Do not resuscitate: Secondary | ICD-10-CM | POA: Diagnosis not present

## 2017-04-18 DIAGNOSIS — Z8249 Family history of ischemic heart disease and other diseases of the circulatory system: Secondary | ICD-10-CM

## 2017-04-18 DIAGNOSIS — G893 Neoplasm related pain (acute) (chronic): Secondary | ICD-10-CM | POA: Diagnosis present

## 2017-04-18 DIAGNOSIS — E8770 Fluid overload, unspecified: Secondary | ICD-10-CM | POA: Diagnosis not present

## 2017-04-18 DIAGNOSIS — R404 Transient alteration of awareness: Secondary | ICD-10-CM | POA: Diagnosis not present

## 2017-04-18 DIAGNOSIS — J90 Pleural effusion, not elsewhere classified: Secondary | ICD-10-CM | POA: Diagnosis not present

## 2017-04-18 DIAGNOSIS — C9002 Multiple myeloma in relapse: Secondary | ICD-10-CM | POA: Diagnosis present

## 2017-04-18 DIAGNOSIS — Y95 Nosocomial condition: Secondary | ICD-10-CM | POA: Diagnosis present

## 2017-04-18 DIAGNOSIS — F329 Major depressive disorder, single episode, unspecified: Secondary | ICD-10-CM | POA: Diagnosis present

## 2017-04-18 DIAGNOSIS — D638 Anemia in other chronic diseases classified elsewhere: Secondary | ICD-10-CM | POA: Diagnosis present

## 2017-04-18 DIAGNOSIS — K219 Gastro-esophageal reflux disease without esophagitis: Secondary | ICD-10-CM | POA: Diagnosis present

## 2017-04-18 DIAGNOSIS — E876 Hypokalemia: Secondary | ICD-10-CM | POA: Diagnosis not present

## 2017-04-18 DIAGNOSIS — T501X5A Adverse effect of loop [high-ceiling] diuretics, initial encounter: Secondary | ICD-10-CM | POA: Diagnosis not present

## 2017-04-18 DIAGNOSIS — I482 Chronic atrial fibrillation: Secondary | ICD-10-CM | POA: Diagnosis not present

## 2017-04-18 DIAGNOSIS — Z6827 Body mass index (BMI) 27.0-27.9, adult: Secondary | ICD-10-CM

## 2017-04-18 DIAGNOSIS — C779 Secondary and unspecified malignant neoplasm of lymph node, unspecified: Secondary | ICD-10-CM | POA: Diagnosis present

## 2017-04-18 DIAGNOSIS — Z8701 Personal history of pneumonia (recurrent): Secondary | ICD-10-CM

## 2017-04-18 DIAGNOSIS — F32A Depression, unspecified: Secondary | ICD-10-CM | POA: Diagnosis present

## 2017-04-18 DIAGNOSIS — Z85118 Personal history of other malignant neoplasm of bronchus and lung: Secondary | ICD-10-CM

## 2017-04-18 MED ORDER — GADOBENATE DIMEGLUMINE 529 MG/ML IV SOLN
15.0000 mL | Freq: Once | INTRAVENOUS | Status: AC | PRN
  Administered 2017-04-18: 15 mL via INTRAVENOUS

## 2017-04-18 NOTE — Discharge Summary (Signed)
Physician Discharge Summary  Darlene Howard SLP:[id number] DOB: 12-05-1951 DOA: 04/12/2017  PCP: [name], MD  Admit date: 04/12/2017 Discharge date: 04/15/2017  Admitted From: Home.  Disposition:  Home  Recommendations for Outpatient Follow-up:  1. Follow up with PCP in 1-2 weeks 2. Please obtain BMP/CBC in one week 3. Please follow up with oncology as recommended     Discharge Condition: stable.  CODE STATUS: full code.  Diet recommendation: Heart Healthy   Brief/Interim Summary: Darlene P Mooreis a 67 y.o.femalewith medical history significant of Multiple myeloma, s/p stem cell transplant on chemo, hypercalcemia diabetes mellitus, neuropathy from chemo, hypertension, recently diagnosed with pneumonia, discharged on 8 th April, Presents today for worsening generalized weakness.  Discharge Diagnoses:  Active Problems:   Multiple myeloma (HCC)   Essential hypertension   Hypercalcemia of malignancy   Dehydration   Protein calorie malnutrition (HCC)   Lobar pneumonia ([place])   HCAP (healthcare-associated pneumonia)  Persistent health care associated pneumonia; Admitted for IV antibiotics. Blood cultures done and negative on discharge.  Completed almost 10 days of IV antibiotics and 14 days of total antibiotics.  Persistent leukocytosis, but no fever.  Lactic acid normalized after fluids and antibiotics.    Multiple myeloma:  Pt has an appt on Tuesday with an oncologist at [facility].    Dehydration:  Improved with hydration. Resume IV fluids.    Hypercalcemia of malignancy: improved with hydration. Monitor calcium level in one week.   Failure to thrive:  Nutritionist consulted for recommendations.   Anemia of chronic disease: hemoglobin ranging between 9 to 8.  Monitor for hemoglobin drop.   Hypomagnesemia: replaced with 3 gm IV mag.   Persistent leukocytosis: more likely reactive.  She completed almost 14 days of antibiotics. No fever. No  respiratory symptoms.    Hypertension:  Well controlled.    Discharge Instructions  Discharge Instructions    Discharge instructions    Complete by:  As directed    Please follow up with PCP in 1 to 2 weeks.  Pleas follow up with oncologist at [facility] in am.     Allergies as of 04/15/2017      Reactions   Codeine Nausea And Vomiting   Tramadol Nausea And Vomiting      Medication List    STOP taking these medications   levofloxacin 500 MG tablet Commonly known as:  LEVAQUIN     TAKE these medications   acetaminophen 500 MG tablet Commonly known as:  TYLENOL Take 500 mg by mouth every 6 (six) hours as needed for mild pain, moderate pain, fever or headache.   aspirin EC 81 MG tablet Take 81 mg by mouth daily.   dexamethasone 4 MG tablet Commonly known as:  DECADRON TAKE 10 TABS ONCE WEEKLY  WHILE UNDERGOING VELCADE  INJECTIONS. What changed:  See the new instructions.   dorzolamide-timolol 22.3-6.8 MG/ML ophthalmic solution Commonly known as:  COSOPT Place 1 drop into the left eye 2 (two) times daily.   feeding supplement (ENSURE ENLIVE) Liqd Take 237 mLs by mouth 2 (two) times daily between meals.   gabapentin 100 MG capsule Commonly known as:  NEURONTIN Take 100 mg by mouth daily.   guaiFENesin-dextromethorphan 100-10 MG/5ML syrup Commonly known as:  ROBITUSSIN DM Take 5 mLs by mouth every 4 (four) hours as needed for cough.   latanoprost 0.005 % ophthalmic solution Commonly known as:  XALATAN Place 1 drop into both eyes at bedtime.   loperamide 2 MG capsule Commonly known as:  IMODIUM Take 2-4 mg by mouth every 2 (two) hours as needed for diarrhea or loose stools.   loratadine 10 MG tablet Commonly known as:  CLARITIN TAKE ONE TABLET BY MOUTH ONCE DAILY.   LUMIGAN 0.01 % Soln Generic drug:  bimatoprost Place 1 drop into both eyes at bedtime.   multivitamin with minerals Tabs tablet Take 1 tablet by mouth daily.   pantoprazole 40 MG  tablet Commonly known as:  PROTONIX TAKE 1 TABLET BY MOUTH  DAILY FOR ACID REFLUX   potassium chloride SA 20 MEQ tablet Commonly known as:  K-DUR,KLOR-CON TAKE 1 TABLET BY MOUTH  TWICE A DAY   PREMARIN vaginal cream Generic drug:  conjugated estrogens PLACE 1 APPLICATORFUL VAGINALLY TWICE A WEEK.   temazepam 15 MG capsule Commonly known as:  RESTORIL TAKE 1 CAPSULE AT BEDTIME AS NEEDED FOR SLEEP.   valACYclovir 1000 MG tablet Commonly known as:  VALTREX TAKE 1 TABLET BY MOUTH  DAILY AS NEEDED What changed:  See the new instructions.      Follow-up Information    [name], MD. Schedule an appointment as soon as possible for a visit in 1 week(s).   Specialty:  Family Medicine Contact information: [address] [place] [address] [phone]          Allergies  Allergen Reactions  . Codeine Nausea And Vomiting  . Tramadol Nausea And Vomiting    Consultations:  None.    Procedures/Studies: Dg Chest 2 View  Result Date: 04/12/2017 CLINICAL DATA:  Fevers and recent pneumonia, history of myeloma EXAM: CHEST  2 VIEW COMPARISON:  04/03/2017 FINDINGS: Cardiac shadow is stable. The lungs are well aerated bilaterally. Old rib fractures are again seen bilaterally with interval healing. Fullness is again noted in the right suprahilar region consistent with focal infiltrate. No new mass lesion is seen. IMPRESSION: Stable appearing right suprahilar infiltrate. Chronic changes consistent with the known history of multiple myeloma. Electronically Signed   By: Inez Catalina M.D.   On: 04/12/2017 12:27   Dg Chest 2 View  Result Date: 04/03/2017 CLINICAL DATA:  Awakened today with increase cough an uncontrollable shaking. History of multiple myeloma on chemotherapy. EXAM: CHEST  2 VIEW COMPARISON:  Chest x-ray of April 25th 25 teen FINDINGS: The lungs are mildly hypoinflated. There is confluent alveolar opacity in the right perihilar region more conspicuous than  on the previous study. This appears to reflect new infiltrate superimposed upon a posterior chest wall soft tissue mass. There are lateral left rib deformities which have appeared since the previous study. The cardiac silhouette is enlarged. The pulmonary vascularity is not engorged. There degenerative changes of both shoulders. The thoracic vertebral bodies are preserved in height. IMPRESSION: New right upper lobe pneumonia extending to the hilar region. Followup PA and lateral chest X-ray is recommended in 3-4 weeks following trial of antibiotic therapy to ensure resolution and exclude underlying malignancy. Persistent posterior right mid thoracic pleural based mass consistent with myelomatous involvement. New rib deformity on the left laterally involving the fifth and sixth ribs consistent with myelomatous involvement. Electronically Signed   By: David  Martinique M.D.   On: 04/03/2017 09:55   Dg Swallowing Func-speech Pathology  Result Date: 04/04/2017 Objective Swallowing Evaluation: Type of Study: Bedside Swallow Evaluation Patient Details Name: Darlene Howard MRN: [id number] Date of Birth: Sep 17, 1951 Today's Date: 04/04/2017 Time: SLP Start Time (ACUTE ONLY): 1418-SLP Stop Time (ACUTE ONLY): 1433 SLP Time Calculation (min) (ACUTE ONLY): 15 min Past Medical  History: Past Medical History: Diagnosis Date . Bruises easily   d/t being on COumadin . Cholelithiasis  . DDD (degenerative disc disease), lumbar  . Dehydration 03/11/2017 . Fatty liver  . GERD (gastroesophageal reflux disease)   takes Pantoprazole daily . Glaucoma   uses eye drops as instructed . H/O stem cell transplant ([place])   04-26-2006  AT [facility] . Herpes   takes Valtrex as needed . History of colon polyps  . Hypercalcemia of malignancy 03/11/2017 . Hypertension   takes Amlodipine and Diovan daily . Left radial fracture 01/21/2017 . Lung cancer ([place])   chest wall lesion aadn 6th rib . Multiple myeloma DX  2006  STATE IIA,  IgA  KAPPA  STABLE  PER DR MOHAMED--   S/P STEM CELL TRANSPLANT 2007 /  RECURRENCE 2008  CHEMO ENDED 03-30-2008/   NOW ON MAINTANANCE REVLIMID ) . Neuropathy associated with multiple myeloma (HCC)   POST CHEMOTHERAPY AND STEM CELL TRANSPLANT . Personal history of colon cancer   ASCENDING COLON--  S/P  RIGHT HEMICOLECTOMY WITH NEGATIVE NODES/    NO RECURRENCE . Type 2 diabetes mellitus ([place])  . Vocal cord nodule   resolved- see Dr. Deeann Saint notes in media tab 06/17/14 Past Surgical History: Past Surgical History: Procedure Laterality Date . ABDOMINAL HYSTERECTOMY  1983  w/  appendectomy and left salpingoophorectom . CATARACT EXTRACTION W/PHACO Left 01/20/2014  Procedure: CATARACT EXTRACTION PHACO AND INTRAOCULAR LENS PLACEMENT (IOC);  Surgeon: Adonis Brook, MD;  Location: [place];  Service: Ophthalmology;  Laterality: Left; . CERVICAL CONE BIOPSY   . CHOLECYSTECTOMY N/A 02/28/2016  Procedure: LAPAROSCOPIC CHOLECYSTECTOMY;  Surgeon: Ralene Ok, MD;  Location: [place] [facility];  Service: General;  Laterality: N/A; . CO2  ABLATION VULVA PERIANAL AND VAGINAL DYSPLATIC AREAS  02-15-2010 . COLONOSCOPY  10/07/2006  EZM:OQHUTM rectum/Status post right hemicolectomy. Normal residual colon . COLONOSCOPY  10/2009  RMR: diminutive RS polyp (hyperplastic).  . COLONOSCOPY N/A 12/15/2014  Procedure: COLONOSCOPY;  Surgeon: Daneil Dolin, MD;  [facility];  Service: Endoscopy;  Laterality: N/A;  130  . ESOPHAGOGASTRODUODENOSCOPY  04/02/2005  LYY:TKPTWS/FKCLEXNTZGYF polyp with central erosion versus ulceration in the body of the stomach, resected and recovered Remainder of the gastric mucosa, D1, D2 appeared normal . HEMICOLECTOMY Right 04-27-2005  CARCINOMA   ASCENDING COLON . LESION REMOVAL N/A 05/06/2014  Procedure: LASER OF THE VAGINA;  Surgeon: Janie Morning, MD;  Location: [facility];  Service: Gynecology;  Laterality: N/A; . LIMBAL STEM CELL TRANSPLANT  04-26-2006    ([facility]) . VULVAR LESION REMOVAL N/A 05/06/2014  Procedure: LASER OF VULVAR LESION;   Surgeon: Janie Morning, MD;  Location[facility];  Service: Gynecology;  Laterality: N/A; . VULVECTOMY N/A 05/06/2014  Procedure: WIDE LOCAL EXCISION LEFT LABIA MAJORA;  Surgeon: Janie Morning, MD;  Location: [place];  Service: Gynecology;  Laterality: N/A; HPI:  66 y.o. 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  Pt found to have right UL pna, concerning for aspiration.  MD ordered MBS.   No Data Recorded Assessment / Plan / Recommendation CHL IP CLINICAL IMPRESSIONS 04/04/2017 Clinical Impression Functional oropharyngeal swallow ability without aspiration/ frank penetration of any consistency tested.  Pt has strong timely swallow without residuals.  Of note, pt did cough x1 during testing.  Upon esophageal  sweep at end of testing, pt appeared with slow clearance distally without awareness.  Water consumption faciliated clearance. Pt may benefit from dedicated esophageal evaluation if MD indicates.  Using live video, educated pt and her sister [name] to findings.  Thanks for this referral. NO SLP follow up indicated.  SLP Visit Diagnosis Dysphagia, unspecified (R13.10) Attention and concentration deficit following -- Frontal lobe and executive function deficit following -- Impact on safety and function Mild aspiration risk   No flowsheet data found.  No flowsheet data found. CHL IP DIET RECOMMENDATION 04/04/2017 SLP Diet Recommendations Regular solids;Thin liquid Liquid Administration via Cup;Straw Medication Administration Whole meds with liquid Compensations Slow rate;Small sips/bites Postural Changes Remain semi-upright after after feeds/meals (Comment);Seated upright at 90 degrees   CHL IP OTHER RECOMMENDATIONS 04/04/2017 Recommended Consults Consider esophageal  assessment Oral Care Recommendations Oral care BID Other Recommendations --   CHL IP FOLLOW UP RECOMMENDATIONS 04/04/2017 Follow up Recommendations None   No flowsheet data found.     CHL IP ORAL PHASE 04/04/2017 Oral Phase WFL Oral - Pudding Teaspoon -- Oral - Pudding Cup -- Oral - Honey Teaspoon -- Oral - Honey Cup -- Oral - Nectar Teaspoon -- Oral - Nectar Cup WFL Oral - Nectar Straw -- Oral - Thin Teaspoon -- Oral - Thin Cup WFL Oral - Thin Straw WFL Oral - Puree WFL Oral - Mech Soft -- Oral - Regular WFL Oral - Multi-Consistency -- Oral - Pill WFL Oral Phase - Comment --  CHL IP PHARYNGEAL PHASE 04/04/2017 Pharyngeal Phase WFL Pharyngeal- Pudding Teaspoon -- Pharyngeal -- Pharyngeal- Pudding Cup -- Pharyngeal -- Pharyngeal- Honey Teaspoon -- Pharyngeal -- Pharyngeal- Honey Cup -- Pharyngeal -- Pharyngeal- Nectar Teaspoon -- Pharyngeal -- Pharyngeal- Nectar Cup WFL Pharyngeal -- Pharyngeal- Nectar Straw -- Pharyngeal -- Pharyngeal- Thin Teaspoon -- Pharyngeal -- Pharyngeal- Thin Cup WFL;Penetration/Aspiration during swallow Pharyngeal Material enters airway, remains ABOVE vocal cords then ejected out Pharyngeal- Thin Straw WFL Pharyngeal -- Pharyngeal- Puree WFL Pharyngeal -- Pharyngeal- Mechanical Soft -- Pharyngeal -- Pharyngeal- Regular WFL Pharyngeal -- Pharyngeal- Multi-consistency -- Pharyngeal -- Pharyngeal- Pill WFL Pharyngeal -- Pharyngeal Comment --  CHL IP CERVICAL ESOPHAGEAL PHASE 04/04/2017 Cervical Esophageal Phase WFL Pudding Teaspoon -- Pudding Cup -- Honey Teaspoon -- Honey Cup -- Nectar Teaspoon -- Nectar Cup -- Nectar Straw -- Thin Teaspoon -- Thin Cup -- Thin Straw -- Puree -- Mechanical Soft -- Regular -- Multi-consistency -- Pill -- Cervical Esophageal Comment -- No flowsheet data found. Janett Labella [place], [place] [facility] SLP [phone]                  Subjective: No new complaints.   Discharge Exam: Vitals:   04/15/17 0419 04/15/17 1623  BP: (!) 150/84 (!) 148/69  Pulse: (!)  107 (!) 114  Resp: 16 18  Temp: 98.4 F (36.9 C) 98.9 F (37.2 C)   Vitals:   04/14/17 1456 04/14/17 2100 04/15/17 0419 04/15/17 1623  BP: (!) 107/50 138/60 (!) 150/84 (!) 148/69  Pulse: (!) 107 (!) 110 (!) 107 (!) 114  Resp: _0 Temp: 98.6 F (37 C) 98.8 F (37.1 C) 98.4 F (36.9 C) 98.9 F (37.2 C)  TempSrc: Oral Oral Oral Oral  SpO2: 100% 98% 98% 98%  Weight:      Height:        General: Pt is alert, awake, not in acute distress Cardiovascular: RRR, S1/S2 +, no rubs, no gallops Respiratory: CTA bilaterally, no wheezing, no rhonchi Abdominal:  Soft, NT, ND, bowel sounds + Extremities: no edema, no cyanosis    The results of significant diagnostics from this hospitalization (including imaging, microbiology, ancillary and laboratory) are listed below for reference.     Microbiology: Recent Results (from the past 240 hour(s))  Culture, blood (Routine x 2)     Status: None   Collection Time: 04/12/17 12:39 PM  Result Value Ref Range Status   Specimen Description LEFT ANTECUBITAL  Final   Special Requests   Final    BOTTLES DRAWN AEROBIC AND ANAEROBIC Blood Culture results may not be optimal due to an inadequate volume of blood received in culture bottles   Culture NO GROWTH 5 DAYS  Final   Report Status 04/17/2017 FINAL  Final  Culture, blood (Routine x 2)     Status: None   Collection Time: 04/12/17  1:27 PM  Result Value Ref Range Status   Specimen Description LEFT ANTECUBITAL  Final   Special Requests   Final    BOTTLES DRAWN AEROBIC AND ANAEROBIC Blood Culture results may not be optimal due to an inadequate volume of blood received in culture bottles   Culture NO GROWTH 5 DAYS  Final   Report Status 04/17/2017 FINAL  Final  C difficile quick scan w PCR reflex     Status: None   Collection Time: 04/15/17  9:05 AM  Result Value Ref Range Status   C Diff antigen NEGATIVE NEGATIVE Final   C Diff toxin NEGATIVE NEGATIVE Final   C Diff interpretation No C.  difficile detected.  Final     Labs: BNP (last 3 results) No results for input(s): BNP in the last 8760 hours. Basic Metabolic Panel:  Recent Labs Lab 04/12/17 1239 04/13/17 0552 04/15/17 0518 04/15/17 1556  NA 142 140 142  --   K 3.6 3.6 2.7* 3.2*  CL 109 109 110  --   CO2 25 20* 23  --   GLUCOSE 82 88 78  --   BUN _0 --   CREATININE 1.03* 0.89 0.81  --   CALCIUM 12.2* 11.9* 11.6*  --   MG  --   --  1.4*  --    Liver Function Tests:  Recent Labs Lab 04/12/17 1239  AST 27  ALT 12*  ALKPHOS 64  BILITOT 0.7  PROT 7.8  ALBUMIN 2.5*   No results for input(s): LIPASE, AMYLASE in the last 168 hours. No results for input(s): AMMONIA in the last 168 hours. CBC:  Recent Labs Lab 04/12/17 1239 04/15/17 0518  WBC 16.1* 16.2*  NEUTROABS 12.4*  --   HGB 9.1* 8.2*  HCT 29.8* 26.3*  MCV 96.4 95.3  PLT 225 200   Cardiac Enzymes: No results for input(s): CKTOTAL, CKMB, CKMBINDEX, TROPONINI in the last 168 hours. BNP: Invalid input(s): POCBNP CBG: No results for input(s): GLUCAP in the last 168 hours. D-Dimer No results for input(s): DDIMER in the last 72 hours. Hgb A1c No results for input(s): HGBA1C in the last 72 hours. Lipid Profile No results for input(s): CHOL, HDL, LDLCALC, TRIG, CHOLHDL, LDLDIRECT in the last 72 hours. Thyroid function studies No results for input(s): TSH, T4TOTAL, T3FREE, THYROIDAB in the last 72 hours.  Invalid input(s): FREET3 Anemia work up No results for input(s): VITAMINB12, FOLATE, FERRITIN, TIBC, IRON, RETICCTPCT in the last 72 hours. Urinalysis    Component Value Date/Time   COLORURINE YELLOW 04/12/2017 1435   APPEARANCEUR HAZY (A) 04/12/2017 1435   LABSPEC 1.012 04/12/2017 1435  PHURINE 5.0 04/12/2017 1435   GLUCOSEU NEGATIVE 04/12/2017 1435   HGBUR NEGATIVE 04/12/2017 1435   BILIRUBINUR NEGATIVE 04/12/2017 1435   BILIRUBINUR neg 03/09/2015 1333   [place] 04/12/2017 1435   PROTEINUR NEGATIVE 04/12/2017  1435   UROBILINOGEN 0.2 03/09/2015 1333   NITRITE NEGATIVE 04/12/2017 1435   LEUKOCYTESUR MODERATE (A) 04/12/2017 1435   Sepsis Labs Invalid input(s): PROCALCITONIN,  WBC,  LACTICIDVEN Microbiology Recent Results (from the past 240 hour(s))  Culture, blood (Routine x 2)     Status: None   Collection Time: 04/12/17 12:39 PM  Result Value Ref Range Status   Specimen Description LEFT ANTECUBITAL  Final   Special Requests   Final    BOTTLES DRAWN AEROBIC AND ANAEROBIC Blood Culture results may not be optimal due to an inadequate volume of blood received in culture bottles   Culture NO GROWTH 5 DAYS  Final   Report Status 04/17/2017 FINAL  Final  Culture, blood (Routine x 2)     Status: None   Collection Time: 04/12/17  1:27 PM  Result Value Ref Range Status   Specimen Description LEFT ANTECUBITAL  Final   Special Requests   Final    BOTTLES DRAWN AEROBIC AND ANAEROBIC Blood Culture results may not be optimal due to an inadequate volume of blood received in culture bottles   Culture NO GROWTH 5 DAYS  Final   Report Status 04/17/2017 FINAL  Final  C difficile quick scan w PCR reflex     Status: None   Collection Time: 04/15/17  9:05 AM  Result Value Ref Range Status   C Diff antigen NEGATIVE NEGATIVE Final   C Diff toxin NEGATIVE NEGATIVE Final   C Diff interpretation No C. difficile detected.  Final     Time coordinating discharge: Over 30 minutes  SIGNED:   Hosie Poisson, MD  [facility] 04/18/2017, 12:00 PM Pager   If 7PM-7AM, please contact night-coverage [email or web address] Password [email or web address]

## 2017-04-18 NOTE — ED Triage Notes (Signed)
Pt brought in by [facility] for c/o generalized weakness, chills, and fever  Pt has multiple myeloma and is seen and treated at the cancer center  Initial O2sat was 92% so EMS administered oxygen at 3 liters/min via [place] and it came up to 98%

## 2017-04-19 ENCOUNTER — Encounter (HOSPITAL_COMMUNITY): Payer: Self-pay | Admitting: Emergency Medicine

## 2017-04-19 ENCOUNTER — Emergency Department (HOSPITAL_COMMUNITY): Payer: Medicare Other

## 2017-04-19 DIAGNOSIS — F329 Major depressive disorder, single episode, unspecified: Secondary | ICD-10-CM | POA: Diagnosis present

## 2017-04-19 DIAGNOSIS — J189 Pneumonia, unspecified organism: Secondary | ICD-10-CM | POA: Diagnosis not present

## 2017-04-19 DIAGNOSIS — M4856XA Collapsed vertebra, not elsewhere classified, lumbar region, initial encounter for fracture: Secondary | ICD-10-CM | POA: Diagnosis present

## 2017-04-19 DIAGNOSIS — Z85118 Personal history of other malignant neoplasm of bronchus and lung: Secondary | ICD-10-CM | POA: Diagnosis not present

## 2017-04-19 DIAGNOSIS — E119 Type 2 diabetes mellitus without complications: Secondary | ICD-10-CM | POA: Diagnosis not present

## 2017-04-19 DIAGNOSIS — G8929 Other chronic pain: Secondary | ICD-10-CM | POA: Diagnosis not present

## 2017-04-19 DIAGNOSIS — E876 Hypokalemia: Secondary | ICD-10-CM | POA: Diagnosis present

## 2017-04-19 DIAGNOSIS — R6520 Severe sepsis without septic shock: Secondary | ICD-10-CM | POA: Diagnosis not present

## 2017-04-19 DIAGNOSIS — K8020 Calculus of gallbladder without cholecystitis without obstruction: Secondary | ICD-10-CM | POA: Diagnosis present

## 2017-04-19 DIAGNOSIS — Z85038 Personal history of other malignant neoplasm of large intestine: Secondary | ICD-10-CM | POA: Diagnosis not present

## 2017-04-19 DIAGNOSIS — R5383 Other fatigue: Secondary | ICD-10-CM | POA: Diagnosis not present

## 2017-04-19 DIAGNOSIS — C9002 Multiple myeloma in relapse: Secondary | ICD-10-CM | POA: Diagnosis not present

## 2017-04-19 DIAGNOSIS — I517 Cardiomegaly: Secondary | ICD-10-CM | POA: Diagnosis not present

## 2017-04-19 DIAGNOSIS — N390 Urinary tract infection, site not specified: Secondary | ICD-10-CM | POA: Diagnosis present

## 2017-04-19 DIAGNOSIS — Y92230 Patient room in hospital as the place of occurrence of the external cause: Secondary | ICD-10-CM | POA: Diagnosis not present

## 2017-04-19 DIAGNOSIS — K219 Gastro-esophageal reflux disease without esophagitis: Secondary | ICD-10-CM

## 2017-04-19 DIAGNOSIS — Z6827 Body mass index (BMI) 27.0-27.9, adult: Secondary | ICD-10-CM | POA: Diagnosis not present

## 2017-04-19 DIAGNOSIS — Y95 Nosocomial condition: Secondary | ICD-10-CM | POA: Diagnosis present

## 2017-04-19 DIAGNOSIS — I10 Essential (primary) hypertension: Secondary | ICD-10-CM | POA: Diagnosis not present

## 2017-04-19 DIAGNOSIS — Z8701 Personal history of pneumonia (recurrent): Secondary | ICD-10-CM | POA: Diagnosis not present

## 2017-04-19 DIAGNOSIS — Z8042 Family history of malignant neoplasm of prostate: Secondary | ICD-10-CM | POA: Diagnosis not present

## 2017-04-19 DIAGNOSIS — A419 Sepsis, unspecified organism: Secondary | ICD-10-CM | POA: Diagnosis not present

## 2017-04-19 DIAGNOSIS — Z66 Do not resuscitate: Secondary | ICD-10-CM | POA: Diagnosis present

## 2017-04-19 DIAGNOSIS — K760 Fatty (change of) liver, not elsewhere classified: Secondary | ICD-10-CM | POA: Diagnosis not present

## 2017-04-19 DIAGNOSIS — R531 Weakness: Secondary | ICD-10-CM | POA: Diagnosis not present

## 2017-04-19 DIAGNOSIS — J90 Pleural effusion, not elsewhere classified: Secondary | ICD-10-CM | POA: Diagnosis not present

## 2017-04-19 DIAGNOSIS — M549 Dorsalgia, unspecified: Secondary | ICD-10-CM | POA: Diagnosis not present

## 2017-04-19 DIAGNOSIS — T501X5A Adverse effect of loop [high-ceiling] diuretics, initial encounter: Secondary | ICD-10-CM | POA: Diagnosis not present

## 2017-04-19 DIAGNOSIS — E440 Moderate protein-calorie malnutrition: Secondary | ICD-10-CM | POA: Diagnosis not present

## 2017-04-19 DIAGNOSIS — C9000 Multiple myeloma not having achieved remission: Secondary | ICD-10-CM | POA: Diagnosis not present

## 2017-04-19 DIAGNOSIS — Z9484 Stem cells transplant status: Secondary | ICD-10-CM | POA: Diagnosis not present

## 2017-04-19 DIAGNOSIS — M5410 Radiculopathy, site unspecified: Secondary | ICD-10-CM | POA: Diagnosis not present

## 2017-04-19 DIAGNOSIS — C779 Secondary and unspecified malignant neoplasm of lymph node, unspecified: Secondary | ICD-10-CM | POA: Diagnosis present

## 2017-04-19 DIAGNOSIS — E8770 Fluid overload, unspecified: Secondary | ICD-10-CM | POA: Diagnosis not present

## 2017-04-19 DIAGNOSIS — E43 Unspecified severe protein-calorie malnutrition: Secondary | ICD-10-CM | POA: Diagnosis not present

## 2017-04-19 DIAGNOSIS — M48061 Spinal stenosis, lumbar region without neurogenic claudication: Secondary | ICD-10-CM | POA: Diagnosis present

## 2017-04-19 DIAGNOSIS — D638 Anemia in other chronic diseases classified elsewhere: Secondary | ICD-10-CM | POA: Diagnosis not present

## 2017-04-19 LAB — CBC WITH DIFFERENTIAL/PLATELET
BASOS ABS: 0 10*3/uL (ref 0.0–0.1)
BASOS PCT: 0 %
Eosinophils Absolute: 0 10*3/uL (ref 0.0–0.7)
Eosinophils Relative: 0 %
HEMATOCRIT: 25.3 % — AB (ref 36.0–46.0)
HEMOGLOBIN: 8 g/dL — AB (ref 12.0–15.0)
LYMPHS PCT: 4 %
Lymphs Abs: 0.9 10*3/uL (ref 0.7–4.0)
MCH: 29.5 pg (ref 26.0–34.0)
MCHC: 31.6 g/dL (ref 30.0–36.0)
MCV: 93.4 fL (ref 78.0–100.0)
MONOS PCT: 14 %
Monocytes Absolute: 3 10*3/uL — ABNORMAL HIGH (ref 0.1–1.0)
NEUTROS ABS: 17.5 10*3/uL — AB (ref 1.7–7.7)
NEUTROS PCT: 82 %
Platelets: 187 10*3/uL (ref 150–400)
RBC: 2.71 MIL/uL — ABNORMAL LOW (ref 3.87–5.11)
RDW: 19.2 % — ABNORMAL HIGH (ref 11.5–15.5)
WBC: 21.4 10*3/uL — ABNORMAL HIGH (ref 4.0–10.5)

## 2017-04-19 LAB — POCT I-STAT, CHEM 8
BUN: 10 mg/dL (ref 6–20)
CALCIUM ION: 1.71 mmol/L — AB (ref 1.15–1.40)
Chloride: 110 mmol/L (ref 101–111)
Creatinine, Ser: 0.8 mg/dL (ref 0.44–1.00)
GLUCOSE: 82 mg/dL (ref 65–99)
HEMATOCRIT: 23 % — AB (ref 36.0–46.0)
HEMOGLOBIN: 7.8 g/dL — AB (ref 12.0–15.0)
Potassium: 3.5 mmol/L (ref 3.5–5.1)
SODIUM: 141 mmol/L (ref 135–145)
TCO2: 26 mmol/L (ref 0–100)

## 2017-04-19 LAB — PROTIME-INR
INR: 1.27
PROTHROMBIN TIME: 16 s — AB (ref 11.4–15.2)

## 2017-04-19 LAB — COMPREHENSIVE METABOLIC PANEL
ALBUMIN: 2.3 g/dL — AB (ref 3.5–5.0)
ALT: 9 U/L — ABNORMAL LOW (ref 14–54)
ANION GAP: 11 (ref 5–15)
AST: 35 U/L (ref 15–41)
Alkaline Phosphatase: 65 U/L (ref 38–126)
BUN: 12 mg/dL (ref 6–20)
CO2: 24 mmol/L (ref 22–32)
Calcium: 12.4 mg/dL — ABNORMAL HIGH (ref 8.9–10.3)
Chloride: 106 mmol/L (ref 101–111)
Creatinine, Ser: 0.73 mg/dL (ref 0.44–1.00)
GFR calc Af Amer: >60 mL/min (ref >60)
GFR calc non Af Amer: >60 mL/min (ref >60)
GLUCOSE: 80 mg/dL (ref 65–99)
POTASSIUM: 3.4 mmol/L — AB (ref 3.5–5.1)
SODIUM: 141 mmol/L (ref 135–145)
TOTAL PROTEIN: 8 g/dL (ref 6.5–8.1)
Total Bilirubin: 0.7 mg/dL (ref 0.3–1.2)

## 2017-04-19 LAB — URINALYSIS, ROUTINE W REFLEX MICROSCOPIC
BILIRUBIN URINE: NEGATIVE
GLUCOSE, UA: NEGATIVE mg/dL
Ketones, ur: NEGATIVE mg/dL
NITRITE: NEGATIVE
PH: 5 (ref 5.0–8.0)
Protein, ur: 100 mg/dL — AB
Specific Gravity, Urine: 1.025 (ref 1.005–1.030)

## 2017-04-19 LAB — STREP PNEUMONIAE URINARY ANTIGEN: STREP PNEUMO URINARY ANTIGEN: NEGATIVE

## 2017-04-19 LAB — PROCALCITONIN: Procalcitonin: 0.94 ng/mL

## 2017-04-19 LAB — INFLUENZA PANEL BY PCR (TYPE A & B)
Influenza A By PCR: NEGATIVE
Influenza B By PCR: NEGATIVE

## 2017-04-19 LAB — POCT I-STAT TROPONIN I: TROPONIN I, POC: 0 ng/mL (ref 0.00–0.08)

## 2017-04-19 LAB — CG4 I-STAT (LACTIC ACID)
LACTIC ACID, VENOUS: 2.16 mmol/L — AB (ref 0.5–1.9)
Lactic Acid, Venous: 1.84 mmol/L (ref 0.5–1.9)

## 2017-04-19 MED ORDER — SODIUM CHLORIDE 0.9 % IV BOLUS (SEPSIS)
1000.0000 mL | Freq: Once | INTRAVENOUS | Status: AC
  Administered 2017-04-19: 1000 mL via INTRAVENOUS

## 2017-04-19 MED ORDER — POLYETHYLENE GLYCOL 3350 17 G PO PACK: 17.0000 g | PACK | Freq: Every day | ORAL | Status: DC | PRN

## 2017-04-19 MED ORDER — ORAL CARE MOUTH RINSE
15.0000 mL | Freq: Two times a day (BID) | OROMUCOSAL | Status: DC
  Administered 2017-04-19 – 2017-04-25 (×11): 15 mL via OROMUCOSAL

## 2017-04-19 MED ORDER — PIPERACILLIN-TAZOBACTAM 3.375 G IVPB
3.3750 g | Freq: Three times a day (TID) | INTRAVENOUS | Status: DC
  Administered 2017-04-19 – 2017-04-24 (×14): 3.375 g via INTRAVENOUS
  Filled 2017-04-19 (×16): qty 50

## 2017-04-19 MED ORDER — HYDRALAZINE HCL 20 MG/ML IJ SOLN
5.0000 mg | INTRAMUSCULAR | Status: DC | PRN
  Filled 2017-04-19: qty 1

## 2017-04-19 MED ORDER — DM-GUAIFENESIN ER 30-600 MG PO TB12
1.0000 | ORAL_TABLET | Freq: Two times a day (BID) | ORAL | Status: DC
  Administered 2017-04-19 – 2017-04-25 (×12): 1 via ORAL
  Filled 2017-04-19 (×14): qty 1

## 2017-04-19 MED ORDER — PANTOPRAZOLE SODIUM 40 MG PO TBEC
40.0000 mg | DELAYED_RELEASE_TABLET | Freq: Every day | ORAL | Status: DC
  Administered 2017-04-19 – 2017-04-25 (×7): 40 mg via ORAL
  Filled 2017-04-19 (×7): qty 1

## 2017-04-19 MED ORDER — LEVALBUTEROL HCL 1.25 MG/0.5ML IN NEBU
1.2500 mg | INHALATION_SOLUTION | Freq: Four times a day (QID) | RESPIRATORY_TRACT | Status: DC
  Filled 2017-04-19 (×2): qty 0.5

## 2017-04-19 MED ORDER — LEVALBUTEROL HCL 1.25 MG/0.5ML IN NEBU
1.2500 mg | INHALATION_SOLUTION | Freq: Four times a day (QID) | RESPIRATORY_TRACT | Status: DC | PRN
  Filled 2017-04-19: qty 0.5

## 2017-04-19 MED ORDER — PIPERACILLIN-TAZOBACTAM 3.375 G IVPB 30 MIN
3.3750 g | Freq: Once | INTRAVENOUS | Status: AC
  Administered 2017-04-19: 3.375 g via INTRAVENOUS
  Filled 2017-04-19: qty 50

## 2017-04-19 MED ORDER — LATANOPROST 0.005 % OP SOLN
1.0000 [drp] | Freq: Every day | OPHTHALMIC | Status: DC
  Administered 2017-04-19 – 2017-04-24 (×6): 1 [drp] via OPHTHALMIC
  Filled 2017-04-19 (×2): qty 2.5

## 2017-04-19 MED ORDER — VANCOMYCIN HCL IN DEXTROSE 1-5 GM/200ML-% IV SOLN
1000.0000 mg | Freq: Once | INTRAVENOUS | Status: AC
  Administered 2017-04-19: 1000 mg via INTRAVENOUS
  Filled 2017-04-19: qty 200

## 2017-04-19 MED ORDER — LATANOPROST 0.005 % OP SOLN
1.0000 [drp] | Freq: Every day | OPHTHALMIC | Status: DC
  Filled 2017-04-19: qty 2.5

## 2017-04-19 MED ORDER — LEVOFLOXACIN IN D5W 750 MG/150ML IV SOLN
750.0000 mg | INTRAVENOUS | Status: DC
  Administered 2017-04-19 – 2017-04-25 (×7): 750 mg via INTRAVENOUS
  Filled 2017-04-19 (×7): qty 150

## 2017-04-19 MED ORDER — MIRTAZAPINE 30 MG PO TABS
30.0000 mg | ORAL_TABLET | Freq: Every day | ORAL | Status: DC
  Administered 2017-04-20 – 2017-04-24 (×5): 30 mg via ORAL
  Filled 2017-04-19 (×2): qty 2
  Filled 2017-04-19: qty 1
  Filled 2017-04-19 (×2): qty 2
  Filled 2017-04-19 (×3): qty 1
  Filled 2017-04-19: qty 2
  Filled 2017-04-19 (×3): qty 1

## 2017-04-19 MED ORDER — ADULT MULTIVITAMIN W/MINERALS CH
1.0000 | ORAL_TABLET | Freq: Every day | ORAL | Status: DC
  Administered 2017-04-19 – 2017-04-25 (×7): 1 via ORAL
  Filled 2017-04-19 (×6): qty 1

## 2017-04-19 MED ORDER — TEMAZEPAM 15 MG PO CAPS: 15.0000 mg | ORAL_CAPSULE | Freq: Every evening | ORAL | Status: DC | PRN

## 2017-04-19 MED ORDER — ENSURE ENLIVE PO LIQD: 237.0000 mL | Freq: Two times a day (BID) | ORAL | Status: DC

## 2017-04-19 MED ORDER — VANCOMYCIN HCL 500 MG IV SOLR
500.0000 mg | Freq: Two times a day (BID) | INTRAVENOUS | Status: DC
  Administered 2017-04-19 – 2017-04-22 (×6): 500 mg via INTRAVENOUS
  Filled 2017-04-19 (×8): qty 500

## 2017-04-19 MED ORDER — DORZOLAMIDE HCL-TIMOLOL MAL 2-0.5 % OP SOLN
1.0000 [drp] | Freq: Two times a day (BID) | OPHTHALMIC | Status: DC
  Administered 2017-04-19 – 2017-04-25 (×13): 1 [drp] via OPHTHALMIC
  Filled 2017-04-19: qty 10

## 2017-04-19 MED ORDER — ONDANSETRON HCL 4 MG/2ML IJ SOLN: 4.0000 mg | Freq: Three times a day (TID) | INTRAMUSCULAR | Status: DC | PRN

## 2017-04-19 MED ORDER — SODIUM CHLORIDE 0.9 % IV SOLN
INTRAVENOUS | Status: DC
  Administered 2017-04-19 – 2017-04-20 (×5): via INTRAVENOUS

## 2017-04-19 MED ORDER — ENOXAPARIN SODIUM 40 MG/0.4ML ~~LOC~~ SOLN
40.0000 mg | SUBCUTANEOUS | Status: DC
  Administered 2017-04-19 – 2017-04-25 (×7): 40 mg via SUBCUTANEOUS
  Filled 2017-04-19 (×7): qty 0.4

## 2017-04-19 MED ORDER — SENNOSIDES-DOCUSATE SODIUM 8.6-50 MG PO TABS: 1.0000 | ORAL_TABLET | Freq: Every evening | ORAL | Status: DC | PRN

## 2017-04-19 MED ORDER — LOPERAMIDE HCL 2 MG PO CAPS
2.0000 mg | ORAL_CAPSULE | ORAL | Status: DC | PRN
  Administered 2017-04-22: 2 mg via ORAL
  Filled 2017-04-19: qty 1

## 2017-04-19 MED ORDER — OXYCODONE-ACETAMINOPHEN 5-325 MG PO TABS
1.0000 | ORAL_TABLET | ORAL | Status: DC | PRN
  Administered 2017-04-19 – 2017-04-23 (×11): 1 via ORAL
  Filled 2017-04-19 (×11): qty 1

## 2017-04-19 MED ORDER — POTASSIUM CHLORIDE 20 MEQ/15ML (10%) PO SOLN
20.0000 meq | Freq: Once | ORAL | Status: AC
  Administered 2017-04-19: 20 meq via ORAL
  Filled 2017-04-19: qty 15

## 2017-04-19 MED ORDER — ACETAMINOPHEN 500 MG PO TABS
500.0000 mg | ORAL_TABLET | Freq: Four times a day (QID) | ORAL | Status: DC | PRN
  Administered 2017-04-25: 500 mg via ORAL
  Filled 2017-04-19: qty 1

## 2017-04-19 MED ORDER — PRO-STAT SUGAR FREE PO LIQD
30.0000 mL | Freq: Three times a day (TID) | ORAL | Status: DC
  Administered 2017-04-19 – 2017-04-20 (×2): 30 mL via ORAL
  Filled 2017-04-19 (×5): qty 30

## 2017-04-19 MED ORDER — GABAPENTIN 100 MG PO CAPS
100.0000 mg | ORAL_CAPSULE | Freq: Every day | ORAL | Status: DC
  Administered 2017-04-19 – 2017-04-25 (×7): 100 mg via ORAL
  Filled 2017-04-19 (×7): qty 1

## 2017-04-19 MED ORDER — ASPIRIN EC 81 MG PO TBEC
81.0000 mg | DELAYED_RELEASE_TABLET | Freq: Every day | ORAL | Status: DC
  Administered 2017-04-19 – 2017-04-25 (×7): 81 mg via ORAL
  Filled 2017-04-19 (×7): qty 1

## 2017-04-19 MED ORDER — VANCOMYCIN HCL IN DEXTROSE 1-5 GM/200ML-% IV SOLN: 1000.0000 mg | Freq: Two times a day (BID) | INTRAVENOUS | Status: DC

## 2017-04-19 MED ORDER — LORATADINE 10 MG PO TABS
10.0000 mg | ORAL_TABLET | Freq: Every day | ORAL | Status: DC
  Administered 2017-04-19 – 2017-04-25 (×7): 10 mg via ORAL
  Filled 2017-04-19 (×7): qty 1

## 2017-04-19 NOTE — ED Provider Notes (Signed)
[facility] DEPT Provider Note   CSN: [id number] Arrival date & time: 04/18/17  2337   By signing my name below, I, Eunice Blase, attest that this documentation has been prepared under the direction and in the presence of Jasha Hodzic, MD. Electronically signed, Eunice Blase, ED Scribe. 04/19/17. 12:29 AM.   History   Chief Complaint Chief Complaint  Patient presents with  . Fever   The history is provided by the patient and medical records. No language interpreter was used.  Fever   This is a recurrent problem. The current episode started more than 2 days ago. The problem occurs constantly. The problem has not changed since onset.Her temperature was unmeasured prior to arrival. Associated symptoms include headaches and cough. Pertinent negatives include no chest pain, no fussiness, no sleepiness, no diarrhea, no vomiting, no congestion and no sore throat. She has tried nothing for the symptoms. The treatment provided no relief.    Darlene Howard is a 66 y.o. female with h/o colon cancer, HTN, T2DM, cholelithiasis, easy bruising, multiple myeloma and lung cancer, transported via EMS to the Emergency Department with concern for subjective fever x > 24 hours. Associated generalized weakness, chills noted per triage, and pt adds headache . Pt reportedly not currently undergoing chemotherapy. Pt's O2 saturation intitially 92% with EMS and 98% after 3 L O2 dispensed via [place] en route. No congestion, vomit, rash, sneeze, cough, diarrhea, constipation, abdominal pain, dysuria, malodorous or dark urine, wheezing, sore throat, ear pain or any other complaints at this time.   Past Medical History:  Diagnosis Date  . Bruises easily    d/t being on COumadin  . Cholelithiasis   . DDD (degenerative disc disease), lumbar   . Dehydration 03/11/2017  . Fatty liver   . GERD (gastroesophageal reflux disease)    takes Pantoprazole daily  . Glaucoma    uses eye drops as instructed  . H/O stem cell  transplant ([place])    04-26-2006  AT [facility]  . Herpes    takes Valtrex as needed  . History of colon polyps   . Hypercalcemia of malignancy 03/11/2017  . Hypertension    takes Amlodipine and Diovan daily  . Left radial fracture 01/21/2017  . Lung cancer ([place])    chest wall lesion aadn 6th rib  . Multiple myeloma DX  2006  STATE IIA,  IgA  KAPPA   STABLE  PER DR MOHAMED--  S/P STEM CELL TRANSPLANT 2007 /  RECURRENCE 2008  CHEMO ENDED 03-30-2008/   NOW ON MAINTANANCE REVLIMID )  . 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 Personal history of colon cancer    ASCENDING COLON--  S/P  RIGHT HEMICOLECTOMY WITH NEGATIVE NODES/    NO RECURRENCE  . Type 2 diabetes mellitus ([place])   . Vocal cord nodule    resolved- see Dr. Deeann Saint notes in media tab 06/17/14    Patient Active Problem List   Diagnosis Date Noted  . HCAP (healthcare-associated pneumonia) 04/12/2017  . Chemotherapy-induced neuropathy ([place]) 04/03/2017  . Lobar pneumonia ([place]) 04/03/2017  . Depression 03/18/2017  . Protein calorie malnutrition ([place]) 03/18/2017  . Hypercalcemia of malignancy 03/11/2017  . Dehydration 03/11/2017  . Anemia of chronic disease 02/11/2017  . ERRONEOUS ENCOUNTER--DISREGARD 01/27/2017  . Left radial fracture 01/21/2017  . Encounter for antineoplastic chemotherapy 08/27/2016  . Insomnia 05/23/2016  . Thrombocytopenia ([place]) 05/21/2016  . Multiple myeloma not having achieved remission ([place]) 03/12/2016  .  Fatty liver 12/06/2014  . GERD (gastroesophageal reflux disease) 11/02/2014  . 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 Back pain with left-sided radiculopathy 02/22/2014  . Goiter 02/22/2014  . Acute bronchitis 08/25/2013  . Maxillary sinusitis, acute 12/29/2012  . DISORDER OF BONE AND CARTILAGE UNSPECIFIED 08/16/2010  . Allergic rhinitis 04/30/2010  .  Carcinoma in situ of colon 09/28/2008  . Multiple myeloma ([place]) 05/03/2008  . Glaucoma 05/03/2008  . Essential hypertension 05/03/2008    Past Surgical History:  Procedure Laterality Date  . ABDOMINAL HYSTERECTOMY  1983   w/  appendectomy and left salpingoophorectom  . CATARACT EXTRACTION W/PHACO Left 01/20/2014   Procedure: CATARACT EXTRACTION PHACO AND INTRAOCULAR LENS PLACEMENT (IOC);  Surgeon: Adonis Brook, MD;  Location: [facility];  Service: Ophthalmology;  Laterality: Left;  . CERVICAL CONE BIOPSY    . CHOLECYSTECTOMY N/A 02/28/2016   Procedure: LAPAROSCOPIC CHOLECYSTECTOMY;  Surgeon: Ralene Ok, MD;  Location: [facility];  Service: General;  Laterality: N/A;  . CO2  ABLATION VULVA PERIANAL AND VAGINAL DYSPLATIC AREAS  02-15-2010  . COLONOSCOPY  10/07/2006   SNK:NLZJQB rectum/Status post right hemicolectomy. Normal residual colon  . COLONOSCOPY  10/2009   RMR: diminutive RS polyp (hyperplastic).   . COLONOSCOPY N/A 12/15/2014   Procedure: COLONOSCOPY;  Surgeon: Daneil Dolin, MD;  [facility];  Service: Endoscopy;  Laterality: N/A;  130   . ESOPHAGOGASTRODUODENOSCOPY  04/02/2005   HAL:[id number] polyp with central erosion versus ulceration in the body of the stomach, resected and recovered Remainder of the gastric mucosa, D1, D2 appeared normal  . HEMICOLECTOMY Right 04-27-2005   CARCINOMA   ASCENDING COLON  . LESION REMOVAL N/A 05/06/2014   Procedure: LASER OF THE VAGINA;  Surgeon: Janie Morning, MD;  Location[facility];  Service: Gynecology;  Laterality: N/A;  . LIMBAL STEM CELL TRANSPLANT  04-26-2006    ([facility])  . VULVAR LESION REMOVAL N/A 05/06/2014   Procedure: LASER OF VULVAR LESION;  Surgeon: Janie Morning, MD;  [facility];  Service: Gynecology;  Laterality: N/A;  . VULVECTOMY N/A 05/06/2014   Procedure: WIDE LOCAL EXCISION LEFT LABIA MAJORA;  Surgeon: Janie Morning, MD;  Location: [place];   Service: Gynecology;  Laterality: N/A;    OB History    No data available       Home Medications    Prior to Admission medications   Medication Sig Start Date End Date Taking? Authorizing Provider  acetaminophen (TYLENOL) 500 MG tablet Take 500 mg by mouth every 6 (six) hours as needed for mild pain, moderate pain, fever or headache.     Historical Provider, MD  aspirin EC 81 MG tablet Take 81 mg by mouth daily.     Historical Provider, MD  dexamethasone (DECADRON) 4 MG tablet TAKE 10 TABS ONCE WEEKLY  WHILE UNDERGOING VELCADE  INJECTIONS. Patient taking differently: Take 10 tablets by mouth every Monday while undergoing Velcade injections 01/29/17   Curt Bears, MD  dorzolamide-timolol (COSOPT) 22.3-6.8 MG/ML ophthalmic solution Place 1 drop into the left eye 2 (two) times daily.     Historical Provider, MD  feeding supplement, ENSURE ENLIVE, (ENSURE ENLIVE) LIQD Take 237 mLs by mouth 2 (two) times daily between meals. 04/16/17   Hosie Poisson, MD  gabapentin (NEURONTIN) 100 MG capsule Take 100 mg by mouth daily.    Historical Provider, MD  guaiFENesin-dextromethorphan (ROBITUSSIN DM) 100-10 MG/5ML syrup Take 5  mLs by mouth every 4 (four) hours as needed for cough. 04/07/17   Costin Karlyne Greenspan, MD  latanoprost (XALATAN) 0.005 % ophthalmic solution Place 1 drop into both eyes at bedtime. 04/15/17   Hosie Poisson, MD  loperamide (IMODIUM) 2 MG capsule Take 2-4 mg by mouth every 2 (two) hours as needed for diarrhea or loose stools.    Historical Provider, MD  loratadine (CLARITIN) 10 MG tablet TAKE ONE TABLET BY MOUTH ONCE DAILY. 04/02/17   Fayrene Helper, MD  LUMIGAN 0.01 % SOLN Place 1 drop into both eyes at bedtime.     Historical Provider, MD  mirtazapine (REMERON) 30 MG tablet Take 1 tablet (30 mg total) by mouth at bedtime. 04/17/17   Curt Bears, MD  Multiple Vitamin (MULTIVITAMIN WITH MINERALS) TABS tablet Take 1 tablet by mouth daily.    Historical Provider, MD    oxyCODONE-acetaminophen (PERCOCET/ROXICET) 5-325 MG tablet Take 1 tablet by mouth every 4 (four) hours as needed for severe pain. May take one tablet every 4-6 hours as needed for pain 04/17/17   Curt Bears, MD  pantoprazole (PROTONIX) 40 MG tablet TAKE 1 TABLET BY MOUTH  DAILY FOR ACID REFLUX 10/18/16   Fayrene Helper, MD  pomalidomide (POMALYST) 4 MG capsule Take 1 capsule (4 mg total) by mouth daily. Take with water on days 1-21. Repeat every 28 days. 04/17/17   Curt Bears, MD  potassium chloride SA (K-DUR,KLOR-CON) 20 MEQ tablet TAKE 1 TABLET BY MOUTH  TWICE A DAY 10/18/16   Fayrene Helper, MD  PREMARIN vaginal cream PLACE 1 APPLICATORFUL VAGINALLY TWICE A WEEK. 06/29/16   Melissa D Cross, NP  temazepam (RESTORIL) 15 MG capsule TAKE 1 CAPSULE AT BEDTIME AS NEEDED FOR SLEEP. 08/23/16   Fayrene Helper, MD  valACYclovir (VALTREX) 1000 MG tablet TAKE 1 TABLET BY MOUTH  DAILY AS NEEDED Patient taking differently: Take 1 tablet by mouth daily as needed for cold sores 03/19/17   Fayrene Helper, MD    Family History Family History  Problem Relation Age of Onset  . Kidney failure Mother   . Hypertension Mother   . Prostate cancer Father 52  . Hypertension Father     vascular disease   . Stroke Father   . Hypertension Sister   . Hyperlipidemia Sister   . Hypertension Brother   . Hyperlipidemia Brother   . Hypertension Sister   . Hypertension Sister   . Colon cancer Neg Hx   . Breast cancer Neg Hx     Social History Social History  Substance Use Topics  . Smoking status: Never Smoker  . Smokeless tobacco: Never Used  . Alcohol use No     Allergies   Codeine and Tramadol   Review of Systems Review of Systems  Constitutional: Positive for chills, fatigue and fever.  HENT: Negative for congestion, ear pain and sore throat.   Respiratory: Positive for cough. Negative for shortness of breath and wheezing.   Cardiovascular: Negative for chest pain,  palpitations and leg swelling.  Gastrointestinal: Negative for abdominal pain, diarrhea, nausea and vomiting.  Musculoskeletal: Negative for neck pain and neck stiffness.  Skin: Negative for rash.  Neurological: Positive for headaches.    Physical Exam Updated Vital Signs BP (!) 143/70 (BP Location: Left Arm)   Pulse (!) 129   Temp 97.6 F (36.4 C) (Oral)   Resp 18   SpO2 96%   Physical Exam  Constitutional: She is oriented to person, place, and time.  She appears well-developed and well-nourished. No distress.  HENT:  Head: Normocephalic and atraumatic.  Mouth/Throat: Uvula is midline and oropharynx is clear and moist. No oropharyngeal exudate.  Eyes: EOM are normal. Pupils are equal, round, and reactive to light.  Neck: Normal range of motion. Neck supple. Carotid bruit is not present. No tracheal deviation present.  Cardiovascular: Regular rhythm, normal heart sounds and intact distal pulses.  Tachycardia present.   Pulmonary/Chest: Effort normal. No stridor. She has no wheezes. She has no rales. She exhibits no tenderness.  Abdominal: Soft. She exhibits no mass. There is no tenderness. There is no rebound and no guarding.  No lesions  Musculoskeletal: Normal range of motion. She exhibits no edema or tenderness.  Neurological: She is alert and oriented to person, place, and time. She displays normal reflexes.  Skin: Skin is warm and dry. Capillary refill takes less than 2 seconds. No rash noted.  No lesions  Psychiatric: She has a normal mood and affect.  Nursing note and vitals reviewed.    ED Treatments / Results   Vitals:   04/19/17 0115 04/19/17 0200  BP: (!) 157/71 (!) 158/73  Pulse: (!) 125 (!) 124  Resp: (!) 23 (!) 21  Temp:      DIAGNOSTIC STUDIES: Oxygen Saturation is 96% on RA, adequate by my interpretation.    COORDINATION OF CARE: 12:23 AM-Discussed next steps with pt. Pt verbalized understanding and is agreeable with the plan. Will order labs and  medications.   Labs (all labs ordered are listed, but only abnormal results are displayed)  Results for orders placed or performed during the hospital encounter of 04/18/17  Comprehensive metabolic panel  Result Value Ref Range   Sodium 141 135 - 145 mmol/L   Potassium 3.4 (L) 3.5 - 5.1 mmol/L   Chloride 106 101 - 111 mmol/L   CO2 24 22 - 32 mmol/L   Glucose, Bld 80 65 - 99 mg/dL   BUN 12 6 - 20 mg/dL   Creatinine, Ser 0.73 0.44 - 1.00 mg/dL   Calcium 12.4 (H) 8.9 - 10.3 mg/dL   Total Protein 8.0 6.5 - 8.1 g/dL   Albumin 2.3 (L) 3.5 - 5.0 g/dL   AST 35 15 - 41 U/L   ALT 9 (L) 14 - 54 U/L   Alkaline Phosphatase 65 38 - 126 U/L   Total Bilirubin 0.7 0.3 - 1.2 mg/dL   GFR calc non Af Amer >60 >60 mL/min   GFR calc Af Amer >60 >60 mL/min   Anion gap 11 5 - 15  CBC with Differential  Result Value Ref Range   WBC 21.4 (H) 4.0 - 10.5 K/uL   RBC 2.71 (L) 3.87 - 5.11 MIL/uL   Hemoglobin 8.0 (L) 12.0 - 15.0 g/dL   HCT 25.3 (L) 36.0 - 46.0 %   MCV 93.4 78.0 - 100.0 fL   MCH 29.5 26.0 - 34.0 pg   MCHC 31.6 30.0 - 36.0 g/dL   RDW 19.2 (H) 11.5 - 15.5 %   Platelets 187 150 - 400 K/uL   Neutrophils Relative % 82 %   Lymphocytes Relative 4 %   Monocytes Relative 14 %   Eosinophils Relative 0 %   Basophils Relative 0 %   Neutro Abs 17.5 (H) 1.7 - 7.7 K/uL   Lymphs Abs 0.9 0.7 - 4.0 K/uL   Monocytes Absolute 3.0 (H) 0.1 - 1.0 K/uL   Eosinophils Absolute 0.0 0.0 - 0.7 K/uL   Basophils Absolute 0.0 0.0 -  0.1 K/uL   Smear Review MORPHOLOGY UNREMARKABLE   Protime-INR  Result Value Ref Range   Prothrombin Time 16.0 (H) 11.4 - 15.2 seconds   INR 1.27    *Note: Due to a large number of results and/or encounters for the requested time period, some results have not been displayed. A complete set of results can be found in Results Review.   Dg Chest 2 View  Result Date: 04/19/2017 CLINICAL DATA:  Acute onset of fever. Decreased O2 saturation. Initial encounter. EXAM: CHEST  2 VIEW  COMPARISON:  Chest radiograph performed 04/12/2017 FINDINGS: The lungs are well-aerated. Increasing density of right perihilar airspace opacification raises concern for worsening pneumonia. Small bilateral pleural effusions are noted. No pneumothorax is seen. Chronic bilateral rib deformities are again noted, likely reflecting the patient's multiple myeloma. The heart is mildly enlarged. No acute osseous abnormalities are seen. There is chronic degenerative change at both glenohumeral joints. IMPRESSION: 1. Increasing density of right perihilar airspace opacification raises concern for worsening pneumonia. Followup PA and lateral chest X-ray is recommended in 3-4 weeks following trial of antibiotic therapy to ensure resolution and exclude underlying malignancy. 2. Small bilateral pleural effusions. 3. Mild cardiomegaly. Electronically Signed   By: Garald Balding M.D.   On: 04/19/2017 00:26   Dg Chest 2 View  Result Date: 04/12/2017 CLINICAL DATA:  Fevers and recent pneumonia, history of myeloma EXAM: CHEST  2 VIEW COMPARISON:  04/03/2017 FINDINGS: Cardiac shadow is stable. The lungs are well aerated bilaterally. Old rib fractures are again seen bilaterally with interval healing. Fullness is again noted in the right suprahilar region consistent with focal infiltrate. No new mass lesion is seen. IMPRESSION: Stable appearing right suprahilar infiltrate. Chronic changes consistent with the known history of multiple myeloma. Electronically Signed   By: Inez Catalina M.D.   On: 04/12/2017 12:27   Dg Chest 2 View  Result Date: 04/03/2017 CLINICAL DATA:  Awakened today with increase cough an uncontrollable shaking. History of multiple myeloma on chemotherapy. EXAM: CHEST  2 VIEW COMPARISON:  Chest x-ray of Kaela Beitz [age] teen FINDINGS: The lungs are mildly hypoinflated. There is confluent alveolar opacity in the right perihilar region more conspicuous than on the previous study. This appears to reflect new infiltrate  superimposed upon a posterior chest wall soft tissue mass. There are lateral left rib deformities which have appeared since the previous study. The cardiac silhouette is enlarged. The pulmonary vascularity is not engorged. There degenerative changes of both shoulders. The thoracic vertebral bodies are preserved in height. IMPRESSION: New right upper lobe pneumonia extending to the hilar region. Followup PA and lateral chest X-ray is recommended in 3-4 weeks following trial of antibiotic therapy to ensure resolution and exclude underlying malignancy. Persistent posterior right mid thoracic pleural based mass consistent with myelomatous involvement. New rib deformity on the left laterally involving the fifth and sixth ribs consistent with myelomatous involvement. Electronically Signed   By: David  Martinique M.D.   On: 04/03/2017 09:55   Mr Lumbar Spine W Wo Contrast  Result Date: 04/18/2017 CLINICAL DATA:  66 year old female with multiple myeloma. Increased fatigue. Possible cord or nerve compression. EXAM: MRI LUMBAR SPINE WITHOUT AND WITH CONTRAST TECHNIQUE: Multiplanar and multiecho pulse sequences of the lumbar spine were obtained without and with intravenous contrast. CONTRAST:  57m MULTIHANCE GADOBENATE DIMEGLUMINE 529 MG/ML IV SOLN COMPARISON:  Skeletal survey 03/15/2017. CT Abdomen and Pelvis 04/24/2016. Lumbar MRI 03/01/2014 FINDINGS: Segmentation: Normal is demonstrated on the recent radiographs and this corresponds to the  same numbering system used in 2015. Alignment: Chronic grade 1 anterolisthesis of L4 on L5 is stable since 2015. Chronic L2 superior endplate compression fracture is stable. A mild L1 superior endplate compression fracture has occurred since 2015, and is associated with subtotal involvement of the superior L1 body by a tumor. Stable vertebral height and alignment elsewhere. Vertebrae: Disseminated multiple myeloma throughout the visible spine and pelvis. Associated widespread  heterogeneous enhancement. Diffuse involvement of the visible spine, including multiple levels of posterior element involvement. There are areas of significant extraosseous extension of tumor, most notably laterally from the L3 vertebral body as demonstrated on series 5, image 26. However, there is no epidural or neural foraminal extension of tumor identified. Conus medullaris: Extends to the L1-L2 level and appears normal. No abnormal intradural enhancement. Paraspinal and other soft tissues: Bulky retrocrural lymphadenopathy. Negative visualized abdominal viscera. Low level edema in the bilateral erector spinae muscles. Disc levels: Superimposed chronic severe disc and posterior element degeneration in the lumbar spine: L2-L3 moderate spinal stenosis in part related to bulky posterior disc extrusion is stable since 2015. Moderate to severe left L2 foraminal stenosis is stable. L3-L4 moderate multifactorial spinal and bilateral lateral recess stenosis is mildly improved since 2015. L4-L5 multifactorial severe spinal and lateral recess stenosis has mildly progressed since 2015. Moderate to severe left L4 foraminal stenosis is stable. L5-S1 multifactorial severe lateral recess and mild to moderate spinal stenosis is stable since 2015. Moderate to severe right L5 foraminal stenosis is stable. IMPRESSION: 1. Disseminated multiple myeloma throughout the visible spine and pelvis. Mild pathologic compression fracture of the L1 superior endplate could be acute or subacute. Mild L2 superior endplate compression fracture is chronic. 2. Areas of significant extraosseous extension of tumor, including into the right lateral paraspinal soft tissues at L3, but there is no epidural or neural foraminal extension. And thus no malignant neural impingement is identified. 3. Superimposed multilevel chronic degenerative moderate and severe spinal stenosis and neural impingement in the lumbar spine has not significantly changed overall  since 2015. 4. Bulky retrocrural metastatic lymphadenopathy. Electronically Signed   By: Genevie Ann M.D.   On: 04/18/2017 13:21   Dg Swallowing Func-speech Pathology  Result Date: 04/04/2017 Objective Swallowing Evaluation: Type of Study: Bedside Swallow Evaluation Patient Details Name: MIKEA QUADROS MRN: [id number] Date of Birth: 05-12-1951 Today's Date: 04/04/2017 Time: SLP Start Time (ACUTE ONLY): 1418-SLP Stop Time (ACUTE ONLY): 1433 SLP Time Calculation (min) (ACUTE ONLY): 15 min Past Medical History: Past Medical History: Diagnosis Date . Bruises easily   d/t being on COumadin . Cholelithiasis  . DDD (degenerative disc disease), lumbar  . Dehydration 03/11/2017 . Fatty liver  . GERD (gastroesophageal reflux disease)   takes Pantoprazole daily . Glaucoma   uses eye drops as instructed . H/O stem cell transplant ([place])   04-26-2006  AT [facility] . Herpes   takes Valtrex as needed . History of colon polyps  . Hypercalcemia of malignancy 03/11/2017 . Hypertension   takes Amlodipine and Diovan daily . Left radial fracture 01/21/2017 . Lung cancer ([place])   chest wall lesion aadn 6th rib . Multiple myeloma DX  2006  STATE IIA,  IgA  KAPPA  STABLE  PER DR MOHAMED--  S/P STEM CELL TRANSPLANT 2007 /  RECURRENCE 2008  CHEMO ENDED 03-30-2008/   NOW ON MAINTANANCE REVLIMID ) . Neuropathy associated with multiple myeloma (HCC)   POST CHEMOTHERAPY AND STEM CELL TRANSPLANT . Personal history of colon cancer   ASCENDING COLON--  S/P  RIGHT HEMICOLECTOMY WITH NEGATIVE NODES/    NO RECURRENCE . Type 2 diabetes mellitus ([place])  . Vocal cord nodule   resolved- see Dr. Deeann Saint notes in media tab 06/17/14 Past Surgical History: Past Surgical History: Procedure Laterality Date . ABDOMINAL HYSTERECTOMY  1983  w/  appendectomy and left salpingoophorectom . CATARACT EXTRACTION W/PHACO Left 01/20/2014  Procedure: CATARACT EXTRACTION PHACO AND INTRAOCULAR LENS PLACEMENT (IOC);  Surgeon: Adonis Brook, MD;  Location: [place];  Service: Ophthalmology;   Laterality: Left; . CERVICAL CONE BIOPSY   . CHOLECYSTECTOMY N/A 02/28/2016  Procedure: LAPAROSCOPIC CHOLECYSTECTOMY;  Surgeon: Ralene Ok, MD;  Location: [facility];  Service: General;  Laterality: N/A; . CO2  ABLATION VULVA PERIANAL AND VAGINAL DYSPLATIC AREAS  02-15-2010 . COLONOSCOPY  10/07/2006  WYO:VZCHYI rectum/Status post right hemicolectomy. Normal residual colon . COLONOSCOPY  10/2009  RMR: diminutive RS polyp (hyperplastic).  . COLONOSCOPY N/A 12/15/2014  Procedure: COLONOSCOPY;  Surgeon: Daneil Dolin, MD;  [facility];  Service: Endoscopy;  Laterality: N/A;  130  . ESOPHAGOGASTRODUODENOSCOPY  04/02/2005  FOY:DXAJOI/NOMVEHMCNOBS polyp with central erosion versus ulceration in the body of the stomach, resected and recovered Remainder of the gastric mucosa, D1, D2 appeared normal . HEMICOLECTOMY Right 04-27-2005  CARCINOMA   ASCENDING COLON . LESION REMOVAL N/A 05/06/2014  Procedure: LASER OF THE VAGINA;  Surgeon: Janie Morning, MD;  Location: [facility];  Service: Gynecology;  Laterality: N/A; . LIMBAL STEM CELL TRANSPLANT  04-26-2006    ([facility]) . VULVAR LESION REMOVAL N/A 05/06/2014  Procedure: LASER OF VULVAR LESION;  Surgeon: Janie Morning, MD;  Location: [facility];  Service: Gynecology;  Laterality: N/A; . VULVECTOMY N/A 05/06/2014  Procedure: WIDE LOCAL EXCISION LEFT LABIA MAJORA;  Surgeon: Janie Morning, MD;  Location: [place];  Service: Gynecology;  Laterality: N/A; HPI:  66 y.o. 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  Pt found to have right UL pna, concerning for aspiration.  MD ordered MBS.   No Data Recorded Assessment / Plan / Recommendation CHL IP CLINICAL IMPRESSIONS 04/04/2017 Clinical Impression Functional  oropharyngeal swallow ability without aspiration/ frank penetration of any consistency tested.  Pt has strong timely swallow without residuals.  Of note, pt did cough x1 during testing.  Upon esophageal sweep at end of testing, pt appeared with slow clearance distally without awareness.  Water consumption faciliated clearance. Pt may benefit from dedicated esophageal evaluation if MD indicates.  Using live video, educated pt and her sister [name] to findings.  Thanks for this referral. NO SLP follow up indicated.  SLP Visit Diagnosis Dysphagia, unspecified (R13.10) Attention and concentration deficit following -- Frontal lobe and executive function deficit following -- Impact on safety and function Mild aspiration risk   No flowsheet data found.  No flowsheet data found. CHL IP DIET RECOMMENDATION 04/04/2017 SLP Diet Recommendations Regular solids;Thin liquid Liquid Administration via Cup;Straw Medication Administration Whole meds with liquid Compensations Slow rate;Small sips/bites Postural Changes Remain semi-upright after after feeds/meals (Comment);Seated upright at 90 degrees   CH[facility] IP OTHER RECOMMENDATIONS 04/04/2017 Recommended Consults Consider esophageal assessment Oral Care Recommendations Oral care BID Other Recommendations --   [facility] IP FOLLOW UP RECOMMENDATIONS 04/04/2017 Follow up Recommendations None   No flowsheet data found.     CHL IP ORAL PHASE 04/04/2017 Oral  Phase WFL Oral - Pudding Teaspoon -- Oral - Pudding Cup -- Oral - Honey Teaspoon -- Oral - Honey Cup -- Oral - Nectar Teaspoon -- Oral - Nectar Cup WFL Oral - Nectar Straw -- Oral - Thin Teaspoon -- Oral - Thin Cup WFL Oral - Thin Straw WFL Oral - Puree WFL Oral - Mech Soft -- Oral - Regular WFL Oral - Multi-Consistency -- Oral - Pill WFL Oral Phase - Comment --  CHL IP PHARYNGEAL PHASE 04/04/2017 Pharyngeal Phase WFL Pharyngeal- Pudding Teaspoon -- Pharyngeal -- Pharyngeal- Pudding Cup -- Pharyngeal -- Pharyngeal- Honey Teaspoon -- Pharyngeal --  Pharyngeal- Honey Cup -- Pharyngeal -- Pharyngeal- Nectar Teaspoon -- Pharyngeal -- Pharyngeal- Nectar Cup WFL Pharyngeal -- Pharyngeal- Nectar Straw -- Pharyngeal -- Pharyngeal- Thin Teaspoon -- Pharyngeal -- Pharyngeal- Thin Cup WFL;Penetration/Aspiration during swallow Pharyngeal Material enters airway, remains ABOVE vocal cords then ejected out Pharyngeal- Thin Straw WFL Pharyngeal -- Pharyngeal- Puree WFL Pharyngeal -- Pharyngeal- Mechanical Soft -- Pharyngeal -- Pharyngeal- Regular WFL Pharyngeal -- Pharyngeal- Multi-consistency -- Pharyngeal -- Pharyngeal- Pill WFL Pharyngeal -- Pharyngeal Comment --  CHL IP CERVICAL ESOPHAGEAL PHASE 04/04/2017 Cervical Esophageal Phase WFL Pudding Teaspoon -- Pudding Cup -- Honey Teaspoon -- Honey Cup -- Nectar Teaspoon -- Nectar Cup -- Nectar Straw -- Thin Teaspoon -- Thin Cup -- Thin Straw -- Puree -- Mechanical Soft -- Regular -- Multi-consistency -- Pill -- Cervical Esophageal Comment -- No flowsheet data found. Janett Labella [place], [place] [facility] [phone]               EKG Interpretation  Date/Time:  Friday Maisha Bogen 20 2018 00:38:40 EDT Ventricular Rate:  127 PR Interval:    QRS Duration: 73 QT Interval:  302 QTC Calculation: 439 R Axis:   -33 Text Interpretation:  Sinus tachycardia Confirmed by [facility]  MD, Emmaline Kluver ([id number]) on 04/19/2017 12:42:47 AM       Radiology Mr Lumbar Spine W Wo Contrast  Result Date: 04/18/2017 CLINICAL DATA:  66 year old female with multiple myeloma. Increased fatigue. Possible cord or nerve compression. EXAM: MRI LUMBAR SPINE WITHOUT AND WITH CONTRAST TECHNIQUE: Multiplanar and multiecho pulse sequences of the lumbar spine were obtained without and with intravenous contrast. CONTRAST:  51m MULTIHANCE GADOBENATE DIMEGLUMINE 529 MG/ML IV SOLN COMPARISON:  Skeletal survey 03/15/2017. CT Abdomen and Pelvis 04/24/2016. Lumbar MRI 03/01/2014 FINDINGS: Segmentation: Normal is demonstrated on the recent radiographs and  this corresponds to the same numbering system used in 2015. Alignment: Chronic grade 1 anterolisthesis of L4 on L5 is stable since 2015. Chronic L2 superior endplate compression fracture is stable. A mild L1 superior endplate compression fracture has occurred since 2015, and is associated with subtotal involvement of the superior L1 body by a tumor. Stable vertebral height and alignment elsewhere. Vertebrae: Disseminated multiple myeloma throughout the visible spine and pelvis. Associated widespread heterogeneous enhancement. Diffuse involvement of the visible spine, including multiple levels of posterior element involvement. There are areas of significant extraosseous extension of tumor, most notably laterally from the L3 vertebral body as demonstrated on series 5, image 26. However, there is no epidural or neural foraminal extension of tumor identified. Conus medullaris: Extends to the L1-L2 level and appears normal. No abnormal intradural enhancement. Paraspinal and other soft tissues: Bulky retrocrural lymphadenopathy. Negative visualized abdominal viscera. Low level edema in the bilateral erector spinae muscles. Disc levels: Superimposed chronic severe disc and posterior element degeneration in the lumbar spine: L2-L3 moderate spinal stenosis in part related to bulky posterior disc extrusion is  stable since 2015. Moderate to severe left L2 foraminal stenosis is stable. L3-L4 moderate multifactorial spinal and bilateral lateral recess stenosis is mildly improved since 2015. L4-L5 multifactorial severe spinal and lateral recess stenosis has mildly progressed since 2015. Moderate to severe left L4 foraminal stenosis is stable. L5-S1 multifactorial severe lateral recess and mild to moderate spinal stenosis is stable since 2015. Moderate to severe right L5 foraminal stenosis is stable. IMPRESSION: 1. Disseminated multiple myeloma throughout the visible spine and pelvis. Mild pathologic compression fracture of the L1  superior endplate could be acute or subacute. Mild L2 superior endplate compression fracture is chronic. 2. Areas of significant extraosseous extension of tumor, including into the right lateral paraspinal soft tissues at L3, but there is no epidural or neural foraminal extension. And thus no malignant neural impingement is identified. 3. Superimposed multilevel chronic degenerative moderate and severe spinal stenosis and neural impingement in the lumbar spine has not significantly changed overall since 2015. 4. Bulky retrocrural metastatic lymphadenopathy. Electronically Signed   By: Genevie Ann M.D.   On: 04/18/2017 13:21    Procedures Procedures (including critical care time)  Medications Ordered in ED  Medications  feeding supplement (ENSURE ENLIVE) (ENSURE ENLIVE) liquid 237 mL (not administered)  mirtazapine (REMERON) tablet 30 mg (not administered)  oxyCODONE-acetaminophen (PERCOCET/ROXICET) 5-325 MG per tablet 1 tablet (not administered)  gabapentin (NEURONTIN) capsule 100 mg (not administered)  latanoprost (XALATAN) 0.005 % ophthalmic solution 1 drop (not administered)  aspirin EC tablet 81 mg (not administered)  loperamide (IMODIUM) capsule 2-4 mg (not administered)  multivitamin with minerals tablet 1 tablet (not administered)  loratadine (CLARITIN) tablet 10 mg (not administered)  pantoprazole (PROTONIX) EC tablet 40 mg (not administered)  temazepam (RESTORIL) capsule 15 mg (not administered)  acetaminophen (TYLENOL) tablet 500 mg (not administered)  latanoprost (XALATAN) 0.005 % ophthalmic solution 1 drop (not administered)  dorzolamide-timolol (COSOPT) 22.3-6.8 MG/ML ophthalmic solution 1 drop (not administered)  levalbuterol (XOPENEX) nebulizer solution 1.25 mg (not administered)  dextromethorphan-guaiFENesin (MUCINEX DM) 30-600 MG per 12 hr tablet 1 tablet (not administered)  potassium chloride 20 MEQ/15ML (10%) solution 20 mEq (not administered)  enoxaparin (LOVENOX) injection  40 mg (not administered)  ondansetron (ZOFRAN) injection 4 mg (not administered)  0.9 %  sodium chloride infusion (not administered)  polyethylene glycol (MIRALAX / GLYCOLAX) packet 17 g (not administered)  senna-docusate (Senokot-S) tablet 1 tablet (not administered)  vancomycin (VANCOCIN) IVPB 1000 mg/200 mL premix (0 mg Intravenous Stopped 04/19/17 0302)  piperacillin-tazobactam (ZOSYN) IVPB 3.375 g (0 g Intravenous Stopped 04/19/17 0208)  sodium chloride 0.9 % bolus 1,000 mL (1,000 mLs Intravenous New Bag/Given 04/19/17 0209)  sodium chloride 0.9 % bolus 1,000 mL (1,000 mLs Intravenous New Bag/Given 04/19/17 0242)     Final Clinical Impressions(s) / ED Diagnoses  Recurrent pneumonia: will admit to medicine  I personally performed the services described in this documentation, which was scribed in my presence. The recorded information has been reviewed and is accurate.      Veatrice Kells, MD 04/19/17 [phone]

## 2017-04-19 NOTE — ED Notes (Signed)
There was a slight delay on getting abx started d/t no IV access!  Unable to get second set of BC's before starting first abx!  MD aware.

## 2017-04-19 NOTE — Progress Notes (Signed)
Pharmacy Antibiotic Note  Darlene Howard is a 66 y.o. female with hx of multiple myeloma c/o weakness, chills and fever admitted on 04/18/2017 with pneumonia.  Pharmacy has been consulted for zosyn/vancomycin dosing.  Plan: Zosyn 3.375g IV q8h (4 hour infusion).  Vancomycin 1 Gm IV q12h  VT=15-20 mg/L Daily Scr F/u cultures/levels as needed  Height: '5\' 6"'  (167.6 cm) Weight: 161 lb (73 kg) IBW/kg (Calculated) : 59.3  Temp (24hrs), Avg:97.6 F (36.4 C), Min:97.6 F (36.4 C), Max:97.6 F (36.4 C)   Recent Labs Lab 04/12/17 1239 04/12/17 1255 04/12/17 1511 04/13/17 0552 04/15/17 0518 04/19/17 0105  WBC 16.1*  --   --   --  16.2* 21.4*  CREATININE 1.03*  --   --  0.89 0.81 0.73  LATICACIDVEN  --  2.17* 1.65  --   --   --   VANCOTROUGH  --   --   --   --  25*  --     Estimated Creatinine Clearance: 69.8 mL/min (by C-G formula based on SCr of 0.73 mg/dL).    Allergies  Allergen Reactions  . Codeine Nausea And Vomiting  . Tramadol Nausea And Vomiting    Antimicrobials this admission: 4/20 zosyn >>  4/20 vancomycin >>   Dose adjustments this admission:   Microbiology results:  BCx:   UCx:    Sputum:   * MRSA PCR:   Thank you for allowing pharmacy to be a part of this patient's care.  Dorrene German 04/19/2017 3:17 AM

## 2017-04-19 NOTE — Progress Notes (Signed)
Pharmacy Antibiotic Note  Darlene Howard is a 66 y.o. female with hx of multiple myeloma c/o weakness, chills and fever admitted on 04/18/2017 with pneumonia.  Pharmacy has been consulted for zosyn/vancomycin dosing initially, now requested to add Levaquin due to patient's risk for MDR organisms.  Plan: Levaquin 750 mg IV q24h. Will adjust Vancomycin to 500 mg IV q12h based on supratherapeutic level obtained during recent admission.  Will check VT at steady state. Continue Zosyn 3.375g IV q8h (4 hour infusion time).  Daily SCr. F/u cultures.  Height: _0  (167.6 cm) Weight: 172 lb 6.4 oz (78.2 kg) IBW/kg (Calculated) : 59.3  Temp (24hrs), Avg:98.2 F (36.8 C), Min:97.6 F (36.4 C), Max:98.8 F (37.1 C)   Recent Labs Lab 04/12/17 1239 04/12/17 1255 04/12/17 1511 04/13/17 0552 04/15/17 0518 04/19/17 0105 04/19/17 0115 04/19/17 0348  WBC 16.1*  --   --   --  16.2* 21.4*  --   --   CREATININE 1.03*  --   --  0.89 0.81 0.73 0.80  --   LATICACIDVEN  --  2.17* 1.65  --   --   --  2.16* 1.84  VANCOTROUGH  --   --   --   --  25*  --   --   --     Estimated Creatinine Clearance: 72.1 mL/min (by C-G formula based on SCr of 0.8 mg/dL).    Allergies  Allergen Reactions  . Codeine Nausea And Vomiting  . Tramadol Nausea And Vomiting    Antimicrobials this admission:  4/20 Vanc >> 4/20 Zosyn >> 4/20 Levaquin >>  Dose adjustments this admission:   Microbiology results:  4/20 BCx:  4/20 UCx:   4/20 Sputum:   4/20 influenza neg 4/20 strep/leg ur ag:  Thank you for allowing pharmacy to be a part of this patient's care.  Hershal Coria 04/19/2017 12:08 PM

## 2017-04-19 NOTE — Progress Notes (Addendum)
PT Cancellation Note  Patient Details Name: Darlene Howard MRN: [id number] DOB: Nov 07, 1951   Cancelled Treatment:    Reason Eval/Treat Not Completed: Patient declined, no reason specified Pt declined participating this morning.  Requested PT check back later.  *Checked back this afternoon, and pt again refused.   Mathews Stuhr,KATHrine E 04/19/2017, 9:23 AM Carmelia Bake, PT, DPT 04/19/2017 Pager: [phone]

## 2017-04-19 NOTE — Progress Notes (Signed)
OT Cancellation Note  Patient Details Name: Darlene Howard MRN: [id number] DOB: [birth year]   Cancelled Treatment:    Reason Eval/Treat Not Completed: Other (comment).   Pt was on bedpan. She stated she might be willing to sit EOB but not get up today. Did not have a chance to check back.    Henrry Feil 04/19/2017, 2:30 PM  Lesle Chris, OTR/L [phone] 04/19/2017

## 2017-04-19 NOTE — Progress Notes (Signed)
Initial Nutrition Assessment  DOCUMENTATION CODES:   Non-severe (moderate) malnutrition in context of chronic illness  INTERVENTION:   Ensure Enlive po BID, each supplement provides 350 kcal and 20 grams of protein  Prostat liquid protein PO 30 ml BID with meals, each supplement provides 100 kcal, 15 grams protein.  Snacks  MVI  NUTRITION DIAGNOSIS:   Malnutrition related to cancer and cancer related treatments, acute illness (HCAP) as evidenced by 9 percent weight loss in 4 months, energy intake < or equal to 50% for > or equal to 5 days  GOAL:   Patient will meet greater than or equal to 90% of their needs  MONITOR:   PO intake, Supplement acceptance, Labs, Weight trends  REASON FOR ASSESSMENT:   Malnutrition Screening Tool    ASSESSMENT:   66 y.o. 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 Admitted for HCAP with sepsis   Met with pt and family member in room today. Pt reports intermittent poor appetite and oral intake for several months pta. Per chart, pt has lost 17lbs(9%) in 4 months; this is significant. Pt had recent hospital admission from 4/13-4/16 for HCAP but reports that she was eating better at time of discharge but then her appetite declined once she returned home. Pt currently eating <25% meals. Pt reports that supplements give her diarrhea. Pt is willing to try Prostat. RD will order snacks. Per MD note, pt in planning to start new chemo.   Medications reviewed and include: aspirin, lovenox, mirtazapine, MVI, protonix, zosyn, vancomycin, oxycodone  Labs reviewed: Ca 12.4(H), iCa 1.71(H) Wbc- 21.4(H), Hgb 7.8(L), Hct 23(L)  Nutrition-Focused physical exam completed. Findings are no fat depletion, no muscle  depletion, and no edema.   Diet Order:  Diet Heart Room service appropriate? Yes; Fluid consistency: Thin  Skin:  Reviewed, no issues  Last BM:  4/16- diarrhea  Height:   Ht Readings from Last 1 Encounters:  04/19/17 '5\' 6"'  (1.676 m)    Weight:   Wt Readings from Last 1 Encounters:  04/19/17 172 lb 6.4 oz (78.2 kg)    Ideal Body Weight:  59 kg  BMI:  Body mass index is 27.83 kg/m.  Estimated Nutritional Needs:   Kcal:  1600-1900kcal/day   Protein:  94-109g/day   Fluid:  >1.6L/day   EDUCATION NEEDS:   No education needs identified at this time  Koleen Distance, RD, LDN Pager #[phone]

## 2017-04-19 NOTE — ED Notes (Signed)
Pt has a lactic of 2.16 and Hb of 7.8 EDP [place] made aware.

## 2017-04-19 NOTE — H&P (Signed)
History and Physical    Darlene Howard MRN:[id number] DOB: 02/22/1951 DOA: 04/18/2017  Referring MD/NP/PA:   PCP: Margaret Simpson, MD   Patient coming from:  The patient is coming from home.  At baseline, pt is independent for most of ADL.   Chief Complaint: Fever, cough, generalized weakness, back pain, leg weakness,  HPI: Darlene Howard is a 65 y.o. 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  The patient was recently hospitalized from 04/13 to 4/16 due to HCAP. She was treated with total of 14 days of total antibiotics. She has been doing OK until yesterday when she started having fever with temperature 100.4. She also has chills, generalized weakness, dry cough. She has mild SOB, but no chest pain. She also has bilateral leg weakness, left-sided flank pain. She denies nausea, vomiting, diarrhea, abdominal pain. No symptoms of UTI. Patient states that she went to [facility] on Tuesday for second opinion about her multiple myeloma treatment. They are planning to do new chemotherapy for her.  ED Course: pt was found to have WBC 21.4, hemoglobin 8.0 which was 8.2 on 04/15/17, potassium is 3.4, creatinine normal, INR 1.27, pending urinalysis, temperature 97.6, tachycardia, tachypnea, O2 sat 91-94% on room air. Chest x-ray showed right perihilar infiltration. Pending UA. Pt is admitted to tele bed as inpt.  X-ray of lumnar spin showed 1.Disseminated multiple myeloma throughout the visible spine and pelvis. Mild pathologic compression fracture of the L1 superior endplate could be acute or subacute. Mild L2 superior endplate compression fracture is chronic. 2. Areas of significant extraosseous extension of tumor, including into the right lateral  paraspinal soft tissues at L3, but there is no epidural or neural foraminal extension. And thus no malignant neural impingement is identified.  3. Superimposed multilevel chronic degenerative moderate and severe spinal stenosis and neural impingement in the lumbar spine has not significantly changed overall since 2015. 4. Bulky retrocrural metastatic lymphadenopathy.  Review of Systems:   General: has fevers, chills, has poor appetite, has fatigue HEENT: no blurry vision, hearing changes or sore throat Respiratory: has dyspnea, coughing, no wheezing CV: no chest pain, no palpitations GI: no nausea, vomiting, abdominal pain, diarrhea, constipation GU: no dysuria, burning on urination, increased urinary frequency, hematuria  Ext: has leg edema Neuro: no unilateral weakness, numbness, or tingling, no vision change or hearing loss Skin: no rash, no skin tear. MSK: No muscle spasm, no deformity, no limitation of range of movement in spin Heme: No easy bruising.  Travel history: No recent long distant travel.  Allergy:  Allergies  Allergen Reactions  . Codeine Nausea And Vomiting  . Tramadol Nausea And Vomiting    Past Medical History:  Diagnosis Date  . Bruises easily    d/t being on COumadin  . Cholelithiasis   . DDD (degenerative disc disease), lumbar   . Dehydration 03/11/2017  . Fatty liver   . GERD (gastroesophageal reflux disease)    takes Pantoprazole daily  . Glaucoma    uses eye drops as instructed  . H/O stem cell transplant (HCC)    04-26-2006  AT [facility]  . Herpes    takes Valtrex as needed  . History of colon polyps   . Hypercalcemia of malignancy 03/11/2017  . Hypertension    takes Amlodipine and Diovan daily  . Left   radial fracture 01/21/2017  . Lung cancer (HCC)    chest wall lesion aadn 6th rib  . Multiple myeloma DX  2006  STATE IIA,  IgA  KAPPA   STABLE  PER DR MOHAMED--  S/P STEM CELL TRANSPLANT 2007 /  RECURRENCE 2008  CHEMO ENDED 03-30-2008/   NOW ON  MAINTANANCE REVLIMID )  . 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 Personal history of colon cancer    ASCENDING COLON--  S/P  RIGHT HEMICOLECTOMY WITH NEGATIVE NODES/    NO RECURRENCE  . Type 2 diabetes mellitus (HCC)   . Vocal cord nodule    resolved- see Dr. Teoh's notes in media tab 06/17/14    Past Surgical History:  Procedure Laterality Date  . ABDOMINAL HYSTERECTOMY  1983   w/  appendectomy and left salpingoophorectom  . CATARACT EXTRACTION W/PHACO Left 01/20/2014   Procedure: CATARACT EXTRACTION PHACO AND INTRAOCULAR LENS PLACEMENT (IOC);  Surgeon: Greer Geiger, MD;  Location: [facility] OR;  Service: Ophthalmology;  Laterality: Left;  . CERVICAL CONE BIOPSY    . CHOLECYSTECTOMY N/A 02/28/2016   Procedure: LAPAROSCOPIC CHOLECYSTECTOMY;  Surgeon: Armando Ramirez, MD;  Location: [facility] OR;  Service: General;  Laterality: N/A;  . CO2  ABLATION VULVA PERIANAL AND VAGINAL DYSPLATIC AREAS  02-15-2010  . COLONOSCOPY  10/07/2006   RMR:Normal rectum/Status post right hemicolectomy. Normal residual colon  . COLONOSCOPY  10/2009   RMR: diminutive RS polyp (hyperplastic).   . COLONOSCOPY N/A 12/15/2014   Procedure: COLONOSCOPY;  Surgeon: Robert M Rourk, MD;  Location: [facility];  Service: Endoscopy;  Laterality: N/A;  130   . ESOPHAGOGASTRODUODENOSCOPY  04/02/2005   RMR:Normal/Pedunculated polyp with central erosion versus ulceration in the body of the stomach, resected and recovered Remainder of the gastric mucosa, D1, D2 appeared normal  . HEMICOLECTOMY Right 04-27-2005   CARCINOMA   ASCENDING COLON  . LESION REMOVAL N/A 05/06/2014   Procedure: LASER OF THE VAGINA;  Surgeon: Wendy Brewster, MD;  Location: [facility];  Service: Gynecology;  Laterality: N/A;  . LIMBAL STEM CELL TRANSPLANT  04-26-2006    ([facility])  . VULVAR LESION REMOVAL N/A 05/06/2014   Procedure: LASER OF VULVAR LESION;  Surgeon: Wendy Brewster, MD;  Location:  [facility];  Service: Gynecology;  Laterality: N/A;  . VULVECTOMY N/A 05/06/2014   Procedure: WIDE LOCAL EXCISION LEFT LABIA MAJORA;  Surgeon: Wendy Brewster, MD;  Location: [facility];  Service: Gynecology;  Laterality: N/A;    Social History:  reports that she has never smoked. She has never used smokeless tobacco. She reports that she does not drink alcohol or use drugs.  Family History:  Family History  Problem Relation Age of Onset  . Kidney failure Mother   . Hypertension Mother   . Prostate cancer Father 80  . Hypertension Father     vascular disease   . Stroke Father   . Hypertension Sister   . Hyperlipidemia Sister   . Hypertension Brother   . Hyperlipidemia Brother   . Hypertension Sister   . Hypertension Sister   . Colon cancer Neg Hx   . Breast cancer Neg Hx      Prior to Admission medications   Medication Sig Start Date End Date Taking? Authorizing Provider  acetaminophen (TYLENOL) 500 MG tablet Take 500 mg by mouth every 6 (six) hours as needed for mild pain, moderate pain, fever or headache.      Yes Historical Provider, MD  aspirin EC 81 MG tablet Take 81 mg by mouth daily.    Yes Historical Provider, MD  dexamethasone (DECADRON) 4 MG tablet TAKE 10 TABS ONCE WEEKLY  WHILE UNDERGOING VELCADE  INJECTIONS. Patient taking differently: Take 10 tablets by mouth every Monday while undergoing Velcade injections 01/29/17  Yes Mohamed Mohamed, MD  dorzolamide-timolol (COSOPT) 22.3-6.8 MG/ML ophthalmic solution Place 1 drop into the left eye 2 (two) times daily.    Yes Historical Provider, MD  gabapentin (NEURONTIN) 100 MG capsule Take 100 mg by mouth daily.   Yes Historical Provider, MD  guaiFENesin-dextromethorphan (ROBITUSSIN DM) 100-10 MG/5ML syrup Take 5 mLs by mouth every 4 (four) hours as needed for cough. 04/07/17  Yes Costin M Gherghe, MD  latanoprost (XALATAN) 0.005 % ophthalmic solution Place 1 drop into both eyes at bedtime. 04/15/17  Yes  Vijaya Akula, MD  loperamide (IMODIUM) 2 MG capsule Take 2-4 mg by mouth every 2 (two) hours as needed for diarrhea or loose stools.   Yes Historical Provider, MD  loratadine (CLARITIN) 10 MG tablet TAKE ONE TABLET BY MOUTH ONCE DAILY. 04/02/17  Yes Margaret E Simpson, MD  LUMIGAN 0.01 % SOLN Place 1 drop into both eyes at bedtime.    Yes Historical Provider, MD  mirtazapine (REMERON) 30 MG tablet Take 1 tablet (30 mg total) by mouth at bedtime. 04/17/17  Yes Mohamed Mohamed, MD  Multiple Vitamin (MULTIVITAMIN WITH MINERALS) TABS tablet Take 1 tablet by mouth daily.   Yes Historical Provider, MD  oxyCODONE-acetaminophen (PERCOCET/ROXICET) 5-325 MG tablet Take 1 tablet by mouth every 4 (four) hours as needed for severe pain. May take one tablet every 4-6 hours as needed for pain 04/17/17  Yes Mohamed Mohamed, MD  pantoprazole (PROTONIX) 40 MG tablet TAKE 1 TABLET BY MOUTH  DAILY FOR ACID REFLUX 10/18/16  Yes Margaret E Simpson, MD  pomalidomide (POMALYST) 4 MG capsule Take 1 capsule (4 mg total) by mouth daily. Take with water on days 1-21. Repeat every 28 days. 04/17/17  Yes Mohamed Mohamed, MD  potassium chloride SA (K-DUR,KLOR-CON) 20 MEQ tablet TAKE 1 TABLET BY MOUTH  TWICE A DAY 10/18/16  Yes Margaret E Simpson, MD  PREMARIN vaginal cream PLACE 1 APPLICATORFUL VAGINALLY TWICE A WEEK. 06/29/16  Yes Melissa D Cross, NP  temazepam (RESTORIL) 15 MG capsule TAKE 1 CAPSULE AT BEDTIME AS NEEDED FOR SLEEP. 08/23/16  Yes Margaret E Simpson, MD  valACYclovir (VALTREX) 1000 MG tablet TAKE 1 TABLET BY MOUTH  DAILY AS NEEDED Patient taking differently: Take 1 tablet by mouth daily as needed for cold sores 03/19/17  Yes Margaret E Simpson, MD  feeding supplement, ENSURE ENLIVE, (ENSURE ENLIVE) LIQD Take 237 mLs by mouth 2 (two) times daily between meals. Patient not taking: Reported on 04/19/2017 04/16/17   Vijaya Akula, MD    Physical Exam: Vitals:   04/19/17 0042 04/19/17 0115 04/19/17 0200 04/19/17 0230  BP:   (!) 157/71 (!) 158/73 (!) 170/82  Pulse:  (!) 125 (!) 124 (!) 120  Resp:  (!) 23 (!) 21 19  Temp:      TempSrc:      SpO2: 95% 93% 95% 95%  Weight:      Height:       General: Not in acute distress HEENT:       Eyes: PERRL, EOMI, no scleral icterus.       ENT: No discharge from the ears and nose, no pharynx injection, no tonsillar enlargement.          Neck: No JVD, no bruit, no mass felt. Heme: No neck lymph node enlargement. Cardiac: S1/S2, RRR, No murmurs, No gallops or rubs. Respiratory: No rales, wheezing, rhonchi or rubs. GI: Soft, nondistended, nontender, no rebound pain, no organomegaly, BS present. GU: No hematuria Ext: 1+ pitting leg edema bilaterally. 2+DP/PT pulse bilaterally. Musculoskeletal: No joint deformities, No joint redness or warmth, no limitation of ROM in spin. Skin: No rashes.  Neuro: Alert, oriented X3, cranial nerves II-XII grossly intact, has bilateral leg weakness with muscle strength 3/5.   Psych: Patient is not psychotic, no suicidal or hemocidal ideation.  Labs on Admission: I have personally reviewed following labs and imaging studies  CBC:  Recent Labs Lab 04/12/17 1239 04/15/17 0518 04/19/17 0105  WBC 16.1* 16.2* 21.4*  NEUTROABS 12.4*  --  17.5*  HGB 9.1* 8.2* 8.0*  HCT 29.8* 26.3* 25.3*  MCV 96.4 95.3 93.4  PLT 225 200 734   Basic Metabolic Panel:  Recent Labs Lab 04/12/17 1239 04/13/17 0552 04/15/17 0518 04/15/17 1556 04/19/17 0105  NA 142 140 142  --  141  K 3.6 3.6 2.7* 3.2* 3.4*  CL 109 109 110  --  106  CO2 25 20* 23  --  24  GLUCOSE 82 88 78  --  80  BUN _0 --  12  CREATININE 1.03* 0.89 0.81  --  0.73  CALCIUM 12.2* 11.9* 11.6*  --  12.4*  MG  --   --  1.4*  --   --    GFR: Estimated Creatinine Clearance: 69.8 mL/min (by C-G formula based on SCr of 0.73 mg/dL). Liver Function Tests:  Recent Labs Lab 04/12/17 1239 04/19/17 0105  AST 27 35  ALT 12* 9*  ALKPHOS 64 65  BILITOT 0.7 0.7  PROT 7.8 8.0    ALBUMIN 2.5* 2.3*   No results for input(s): LIPASE, AMYLASE in the last 168 hours. No results for input(s): AMMONIA in the last 168 hours. Coagulation Profile:  Recent Labs Lab 04/12/17 1239 04/19/17 0117  INR 1.13 1.27   Cardiac Enzymes: No results for input(s): CKTOTAL, CKMB, CKMBINDEX, TROPONINI in the last 168 hours. BNP (last 3 results) No results for input(s): PROBNP in the last 8760 hours. HbA1C: No results for input(s): HGBA1C in the last 72 hours. CBG: No results for input(s): GLUCAP in the last 168 hours. Lipid Profile: No results for input(s): CHOL, HDL, LDLCALC, TRIG, CHOLHDL, LDLDIRECT in the last 72 hours. Thyroid Function Tests: No results for input(s): TSH, T4TOTAL, FREET4, T3FREE, THYROIDAB in the last 72 hours. Anemia Panel: No results for input(s): VITAMINB12, FOLATE, FERRITIN, TIBC, IRON, RETICCTPCT in the last 72 hours. Urine analysis:    Component Value Date/Time   COLORURINE YELLOW 04/12/2017 1435   APPEARANCEUR HAZY (A) 04/12/2017 1435   LABSPEC 1.012 04/12/2017 1435   PHURINE 5.0 04/12/2017 1435   GLUCOSEU NEGATIVE 04/12/2017 1435   HGBUR NEGATIVE 04/12/2017 1435   BILIRUBINUR NEGATIVE 04/12/2017 1435   BILIRUBINUR neg 03/09/2015 1333   KETONESUR NEGATIVE 04/12/2017 1435   PROTEINUR NEGATIVE 04/12/2017 1435   UROBILINOGEN 0.2 03/09/2015 1333   NITRITE NEGATIVE 04/12/2017 1435   LEUKOCYTESUR MODERATE (A) 04/12/2017 1435   Sepsis Labs: _1 (procalcitonin:4,lacticidven:4) ) Recent Results (from the past 240 hour(s))  Culture, blood (Routine x 2)     Status: None   Collection Time: 04/12/17 12:39 PM  Result Value Ref Range Status   Specimen Description LEFT ANTECUBITAL  Final   Special Requests   Final  BOTTLES DRAWN AEROBIC AND ANAEROBIC Blood Culture results may not be optimal due to an inadequate volume of blood received in culture bottles   Culture NO GROWTH 5 DAYS  Final   Report Status 04/17/2017 FINAL  Final  Culture, blood  (Routine x 2)     Status: None   Collection Time: 04/12/17  1:27 PM  Result Value Ref Range Status   Specimen Description LEFT ANTECUBITAL  Final   Special Requests   Final    BOTTLES DRAWN AEROBIC AND ANAEROBIC Blood Culture results may not be optimal due to an inadequate volume of blood received in culture bottles   Culture NO GROWTH 5 DAYS  Final   Report Status 04/17/2017 FINAL  Final  C difficile quick scan w PCR reflex     Status: None   Collection Time: 04/15/17  9:05 AM  Result Value Ref Range Status   C Diff antigen NEGATIVE NEGATIVE Final   C Diff toxin NEGATIVE NEGATIVE Final   C Diff interpretation No C. difficile detected.  Final     Radiological Exams on Admission: Dg Chest 2 View  Result Date: 04/19/2017 CLINICAL DATA:  Acute onset of fever. Decreased O2 saturation. Initial encounter. EXAM: CHEST  2 VIEW COMPARISON:  Chest radiograph performed 04/12/2017 FINDINGS: The lungs are well-aerated. Increasing density of right perihilar airspace opacification raises concern for worsening pneumonia. Small bilateral pleural effusions are noted. No pneumothorax is seen. Chronic bilateral rib deformities are again noted, likely reflecting the patient's multiple myeloma. The heart is mildly enlarged. No acute osseous abnormalities are seen. There is chronic degenerative change at both glenohumeral joints. IMPRESSION: 1. Increasing density of right perihilar airspace opacification raises concern for worsening pneumonia. Followup PA and lateral chest X-ray is recommended in 3-4 weeks following trial of antibiotic therapy to ensure resolution and exclude underlying malignancy. 2. Small bilateral pleural effusions. 3. Mild cardiomegaly. Electronically Signed   By: Jeffery  Chang M.D.   On: 04/19/2017 00:26   Mr Lumbar Spine W Wo Contrast  Result Date: 04/18/2017 CLINICAL DATA:  65-year-old female with multiple myeloma. Increased fatigue. Possible cord or nerve compression. EXAM: MRI LUMBAR SPINE  WITHOUT AND WITH CONTRAST TECHNIQUE: Multiplanar and multiecho pulse sequences of the lumbar spine were obtained without and with intravenous contrast. CONTRAST:  15mL MULTIHANCE GADOBENATE DIMEGLUMINE 529 MG/ML IV SOLN COMPARISON:  Skeletal survey 03/15/2017. CT Abdomen and Pelvis 04/24/2016. Lumbar MRI 03/01/2014 FINDINGS: Segmentation: Normal is demonstrated on the recent radiographs and this corresponds to the same numbering system used in 2015. Alignment: Chronic grade 1 anterolisthesis of L4 on L5 is stable since 2015. Chronic L2 superior endplate compression fracture is stable. A mild L1 superior endplate compression fracture has occurred since 2015, and is associated with subtotal involvement of the superior L1 body by a tumor. Stable vertebral height and alignment elsewhere. Vertebrae: Disseminated multiple myeloma throughout the visible spine and pelvis. Associated widespread heterogeneous enhancement. Diffuse involvement of the visible spine, including multiple levels of posterior element involvement. There are areas of significant extraosseous extension of tumor, most notably laterally from the L3 vertebral body as demonstrated on series 5, image 26. However, there is no epidural or neural foraminal extension of tumor identified. Conus medullaris: Extends to the L1-L2 level and appears normal. No abnormal intradural enhancement. Paraspinal and other soft tissues: Bulky retrocrural lymphadenopathy. Negative visualized abdominal viscera. Low level edema in the bilateral erector spinae muscles. Disc levels: Superimposed chronic severe disc and posterior element degeneration in the lumbar spine: L2-L3 moderate   spinal stenosis in part related to bulky posterior disc extrusion is stable since 2015. Moderate to severe left L2 foraminal stenosis is stable. L3-L4 moderate multifactorial spinal and bilateral lateral recess stenosis is mildly improved since 2015. L4-L5 multifactorial severe spinal and lateral  recess stenosis has mildly progressed since 2015. Moderate to severe left L4 foraminal stenosis is stable. L5-S1 multifactorial severe lateral recess and mild to moderate spinal stenosis is stable since 2015. Moderate to severe right L5 foraminal stenosis is stable. IMPRESSION: 1. Disseminated multiple myeloma throughout the visible spine and pelvis. Mild pathologic compression fracture of the L1 superior endplate could be acute or subacute. Mild L2 superior endplate compression fracture is chronic. 2. Areas of significant extraosseous extension of tumor, including into the right lateral paraspinal soft tissues at L3, but there is no epidural or neural foraminal extension. And thus no malignant neural impingement is identified. 3. Superimposed multilevel chronic degenerative moderate and severe spinal stenosis and neural impingement in the lumbar spine has not significantly changed overall since 2015. 4. Bulky retrocrural metastatic lymphadenopathy. Electronically Signed   By: Genevie Ann M.D.   On: 04/18/2017 13:21     EKG: Independently reviewed.  Not done in ED, will get one.   Assessment/Plan Principal Problem:   Sepsis ([place]) Active Problems:   Multiple myeloma (HCC)   Essential hypertension   Lower back pain   GERD (gastroesophageal reflux disease)   Anemia of chronic disease   Hypercalcemia of malignancy   Depression   HCAP (healthcare-associated pneumonia)   Hypokalemia   Sepsis and possible HCAP: Patient meets criteria for sepsis with leukocytosis, tachycardia, tachypnea, fever at home. The etiology is not clear. Chest x-ray showed right perihilar infiltration, but patient does not have much respiratory symptoms. The infiltration could be from a previous pneumonia, but we cannot completely rule out HCAP now. Since pt is immunosuppressed, needed broader coverage antibiotics. Pending lactic acid and UA.  - Will admit to bed as inpt - IV Vancomycin and Zosyn - Mucinex for cough  - Xopenex  Neb prn for SOB - Urine legionella and S. pneumococcal antigen - Follow up blood culture x2, sputum culture, plus Flu pcr - will get Procalcitonin and trend lactic acid level per sepsis protocol - IVF: 2L of NS bolus in ED, followed by 125 mL per hour of NS   Hypercalcemia of malignancy: Calcium 12.4, albumin 2.3, calcium is corrected to 13.7. Mental status normal. -IVF as above -f/u by BMP, if not improve with IVF, may consider to give Pamidronate  Lower back pain and leg weakness: X-ray showed disseminated multiple myeloma throughout the visible spine and pelvis.  Mild pathologic compression fracture of the L1 superior endplate. Chronic mild L2 superior endplate compression fracture; areas of significant extraosseous extension of tumor, including into the right lateral paraspinal soft tissues at L3, but there is no epidural or neural foraminal extension, and thus no malignant neural impingement is identified; superimposed multilevel chronic degenerative moderate and severe spinal stenosis and neural impingement in the lumbar spine has not significantly changed overall since 2015. -prn percocet -pt/ot  Multiple myeloma ([place]): recent visit to [place], planning to do new chemo per pt. -follow up with [facility]  Essential hypertension: -IV hydralazine when necessary    GERD: -Protonix  Anemia of chronic disease: hgb stable. -f/u by [facility]B  Depression: Stable, no suicidal or homicidal ideations. -Continue home medications: Remeron  Hypokalemia: K= 3.4 on admission. - Repleted  DVT ppx: SQ Lovenox Code Status: DNR (I discussed the  code status with the patient in the presence of her nephew and sister-in-law, explained the meaning of CODE STATUS, patient wants to DNR) Family Communication: Yes, patient's  nephew and sister-in-law  at bed side Disposition Plan:  Anticipate discharge back to previous home environment Consults called:  none Admission status: Inpatient/tele      Date of Service  04/19/2017    Ivor Costa [facility] Pager [phone]  If 7PM-7AM, please contact night-coverage [email or web address] Password [email or web address] 04/19/2017, 3:28 AM

## 2017-04-19 NOTE — ED Notes (Signed)
Made two IV attempts in RAC and R hand.

## 2017-04-19 NOTE — Care Management Note (Addendum)
Case Management Note  Patient Details  Name: Darlene Howard MRN: [id number] Date of Birth: May 20, 1951  Subjective/Objective: 66 y/o f admitted w/Sepsis. DNR.From home w/sisters-main contact sister-Linda [place] (preferred # to call) home-tel#[phone]. Active w/[facility] HHPT/OT-rep [place] aware & following.PT cons-await recc.                    Action/Plan:d/c plan SNF.   Expected Discharge Date:                  Expected Discharge Plan:  Skilled Nursing Facility  In-House Referral:  Clinical Social Work  Discharge planning Services  CM Consult  Post Acute Care Choice:  Home Health (Active w/[facility] HHPT/OT) Choice offered to:     DME Arranged:    DME Agency:     HH Arranged:    [place] Agency:     Status of Service:  In process, will continue to follow  If discussed at Long Length of Stay Meetings, dates discussed:    Additional Comments:  Dessa Phi, RN 04/19/2017, 12:24 PM

## 2017-04-19 NOTE — Progress Notes (Signed)
[facility]   Patient admitted after midnight see H&P for further details  66 year old female with medical history of multiple myeloma multiple admissions for recurrent pneumonia admitted last night with suspected HCAP  Patient seen and examined, not very interactive but she denies any complaint is daytime report feeling comfortable, denies cough shortness of breath and chest pain.  X-ray reviewed by me shows worsening of opacification consistent with recurrent pneumonia.  Hospital-acquired pneumonia Started on vancomycin and Zosyn will escalate therapy given patient if risk for MDR will add Levaquin per pharmacy recommendation.  Rest history of present illness  Chipper Oman, MD

## 2017-04-20 ENCOUNTER — Encounter (HOSPITAL_COMMUNITY): Payer: Self-pay

## 2017-04-20 DIAGNOSIS — I10 Essential (primary) hypertension: Secondary | ICD-10-CM

## 2017-04-20 DIAGNOSIS — D638 Anemia in other chronic diseases classified elsewhere: Secondary | ICD-10-CM

## 2017-04-20 DIAGNOSIS — E440 Moderate protein-calorie malnutrition: Secondary | ICD-10-CM | POA: Insufficient documentation

## 2017-04-20 LAB — BASIC METABOLIC PANEL
Anion gap: 10 (ref 5–15)
Anion gap: 10 (ref 5–15)
BUN: 10 mg/dL (ref 6–20)
BUN: 7 mg/dL (ref 6–20)
CALCIUM: 11.3 mg/dL — AB (ref 8.9–10.3)
CHLORIDE: 109 mmol/L (ref 101–111)
CO2: 21 mmol/L — ABNORMAL LOW (ref 22–32)
CO2: 21 mmol/L — ABNORMAL LOW (ref 22–32)
CREATININE: 0.68 mg/dL (ref 0.44–1.00)
CREATININE: 0.68 mg/dL (ref 0.44–1.00)
Calcium: 11.5 mg/dL — ABNORMAL HIGH (ref 8.9–10.3)
Chloride: 111 mmol/L (ref 101–111)
GFR calc Af Amer: >60 mL/min (ref >60)
GFR calc Af Amer: >60 mL/min (ref >60)
GFR calc non Af Amer: >60 mL/min (ref >60)
GFR calc non Af Amer: >60 mL/min (ref >60)
GLUCOSE: 81 mg/dL (ref 65–99)
Glucose, Bld: 71 mg/dL (ref 65–99)
Potassium: 3.5 mmol/L (ref 3.5–5.1)
Potassium: 2.6 mmol/L — CL (ref 3.5–5.1)
SODIUM: 142 mmol/L (ref 135–145)
SODIUM: 140 mmol/L (ref 135–145)

## 2017-04-20 LAB — CBC WITH DIFFERENTIAL/PLATELET
BASOS ABS: 0 10*3/uL (ref 0.0–0.1)
BASOS PCT: 0 %
EOS ABS: 0.1 10*3/uL (ref 0.0–0.7)
EOS PCT: 1 %
HCT: 21.6 % — ABNORMAL LOW (ref 36.0–46.0)
HEMOGLOBIN: 6.9 g/dL — AB (ref 12.0–15.0)
LYMPHS PCT: 8 %
Lymphs Abs: 1.3 10*3/uL (ref 0.7–4.0)
MCH: 30.3 pg (ref 26.0–34.0)
MCHC: 31.9 g/dL (ref 30.0–36.0)
MCV: 94.7 fL (ref 78.0–100.0)
Monocytes Absolute: 1.7 10*3/uL — ABNORMAL HIGH (ref 0.1–1.0)
Monocytes Relative: 11 %
Neutro Abs: 12.6 10*3/uL — ABNORMAL HIGH (ref 1.7–7.7)
Neutrophils Relative %: 80 %
PLATELETS: 150 10*3/uL (ref 150–400)
RBC: 2.28 MIL/uL — AB (ref 3.87–5.11)
RDW: 19.1 % — AB (ref 11.5–15.5)
WBC: 15.8 10*3/uL — AB (ref 4.0–10.5)

## 2017-04-20 LAB — URINE CULTURE: Culture: 60000 — AB

## 2017-04-20 LAB — HEMOGLOBIN AND HEMATOCRIT, BLOOD
HCT: 23.3 % — ABNORMAL LOW (ref 36.0–46.0)
Hemoglobin: 7.1 g/dL — ABNORMAL LOW (ref 12.0–15.0)

## 2017-04-20 LAB — BRAIN NATRIURETIC PEPTIDE: B NATRIURETIC PEPTIDE 5: 384.5 pg/mL — AB (ref 0.0–100.0)

## 2017-04-20 LAB — MAGNESIUM: MAGNESIUM: 1.4 mg/dL — AB (ref 1.7–2.4)

## 2017-04-20 LAB — PREPARE RBC (CROSSMATCH)

## 2017-04-20 MED ORDER — SODIUM CHLORIDE 0.9 % IV SOLN: Freq: Once | INTRAVENOUS | Status: DC

## 2017-04-20 MED ORDER — FUROSEMIDE 10 MG/ML IJ SOLN
20.0000 mg | INTRAMUSCULAR | Status: DC | PRN
  Administered 2017-04-21 (×3): 20 mg via INTRAVENOUS
  Filled 2017-04-20 (×3): qty 2

## 2017-04-20 MED ORDER — FLUCONAZOLE 100 MG PO TABS
200.0000 mg | ORAL_TABLET | Freq: Once | ORAL | Status: AC
  Administered 2017-04-20: 200 mg via ORAL
  Filled 2017-04-20: qty 2

## 2017-04-20 MED ORDER — FUROSEMIDE 10 MG/ML IJ SOLN
20.0000 mg | Freq: Once | INTRAMUSCULAR | Status: AC
  Administered 2017-04-20: 20 mg via INTRAVENOUS
  Filled 2017-04-20: qty 2

## 2017-04-20 MED ORDER — POTASSIUM CHLORIDE CRYS ER 20 MEQ PO TBCR
40.0000 meq | EXTENDED_RELEASE_TABLET | Freq: Three times a day (TID) | ORAL | Status: AC
  Administered 2017-04-20 – 2017-04-21 (×3): 40 meq via ORAL
  Filled 2017-04-20 (×3): qty 2

## 2017-04-20 MED ORDER — FENTANYL CITRATE (PF) 100 MCG/2ML IJ SOLN
12.5000 ug | INTRAMUSCULAR | Status: DC | PRN
  Administered 2017-04-20 – 2017-04-21 (×5): 12.5 ug via INTRAVENOUS
  Filled 2017-04-20 (×5): qty 2

## 2017-04-20 MED ORDER — MAGNESIUM SULFATE 4 GM/100ML IV SOLN
4.0000 g | Freq: Once | INTRAVENOUS | Status: AC
  Administered 2017-04-21: 4 g via INTRAVENOUS
  Filled 2017-04-20: qty 100

## 2017-04-20 NOTE — Progress Notes (Signed)
OT Cancellation Note  Patient Details Name: Darlene Howard MRN: [id number] DOB: 1951/11/26   Cancelled Treatment:    Reason Eval/Treat Not Completed: Other (comment). Pt stated she was "too weak and in too much pain" to get up today. Explained importance of mobility and offered pain management, however pt declined. Will attempt again tomorrow.   [place], OT/L  [phone] 04/20/2017 04/20/2017, 1:27 PM

## 2017-04-20 NOTE — Progress Notes (Signed)
PROGRESS NOTE [facility]   Darlene Howard   MRN:[id number] DOB: 02/02/1951  DOA: 04/18/2017 PCP: Margaret Simpson, MD   Brief Narrative:  Darlene Howard is a 65 y.o. 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  The patient was recently hospitalized from 04/13 to 4/16 due to HCAP. She was treated with total of 14 days of total antibiotics. She has been doing OK until yesterday when she started having fever with temperature 100.4. She also has chills, generalized weakness, dry cough. She has mild SOB, but no chest pain. She also has bilateral leg weakness, left-sided flank pain. She denies nausea, vomiting, diarrhea, abdominal pain. No symptoms of UTI. Patient states that she went to [facility] on Tuesday for second opinion about her multiple myeloma treatment. They are planning to do new chemotherapy for her.  Subjective: Patient seen and examined, she report doing fair, feel slight more tired than yesterday. Denies chest pain, SOB and palpitation. Hgb down today.   Assessment & Plan: Sepsis 2/2 HCAP - sepsis physiology resolved, CXR consistent with worsening PNA  Patient on triple abx coverage given high risk for MDR If no clinical improvement consider CT chest  Continue current therapy for now  Follow up blood cultures NGTD  Trend pro calcitonin  Leukocytosis trending down  Continue to monitor   UTI - yeast  Will give one dose of diflucan  Continue to monitor   Hypercalcemia of malignancy - Corrected Ca 12.7 Patient received Zometa  Continue IVF  May consider steroids  Multiple myeloma contributing to back and rob pain  Pain control as needed - Fentanyl added  Will consult Dr. Mohamed on Monday  Patient may need  palliative care management  PT/OT  Anemia of chronic diseases - due to malignancy  Hgb drop to 6.9 - Will transfuse 3 unit of PRBC Monitor CBC   Hypokalemia resolved   DVT prophylaxis: Lovenox  Code Status: FULL  Family Communication: Family at bedside  Disposition Plan: Unclear at this time   Consultants:   None  Procedures:   None   Antimicrobials: Anti-infectives    Start     Dose/Rate Route Frequency Ordered Stop   04/19/17 1600  vancomycin (VANCOCIN) IVPB 1000 mg/200 mL premix  Status:  Discontinued     1,000 mg 200 mL/hr over 60 Minutes Intravenous Every 12 hours 04/19/17 0322 04/19/17 1212   04/19/17 1600  vancomycin (VANCOCIN) 500 mg in sodium chloride 0.9 % 100 mL IVPB     500 mg 100 mL/hr over 60 Minutes Intravenous Every 12 hours 04/19/17 1212     04/19/17 1000  levofloxacin (LEVAQUIN) IVPB 750 mg     750 mg 100 mL/hr over 90 Minutes Intravenous Every 24 hours 04/19/17 0831     04/19/17 0800  piperacillin-tazobactam (ZOSYN) IVPB 3.375 g     3.375 g 12.5 mL/hr over 240 Minutes Intravenous Every 8 hours 04/19/17 0322     04/19/17 0045  vancomycin (VANCOCIN) IVPB 1000 mg/200 mL premix     1,000 mg 200 mL/hr over 60 Minutes Intravenous  Once 04/19/17 0044 04/19/17 0302   04/19/17 0045  piperacillin-tazobactam (ZOSYN) IVPB 3.375 g     3.375 g 100 mL/hr over 30 Minutes Intravenous  Once 04/19/17 0044 04/19/17 0208       Objective: Vitals:     04/19/17 1545 04/19/17 2257 04/20/17 0433 04/20/17 1420  BP: (!) 145/71 (!) 155/99 (!) 154/78 138/89  Pulse: (!) 112 (!) 114 (!) 108 89  Resp: _0 Temp: 99 F (37.2 C) 99.4 F (37.4 C) 98.9 F (37.2 C) 98.5 F (36.9 C)  TempSrc: Oral Oral Oral Oral  SpO2: 97% 100% 100% 100%  Weight:      Height:        Intake/Output Summary (Last 24 hours) at 04/20/17 1730 Last data filed at 04/20/17 1000  Gross per 24 hour  Intake          2319.58 ml  Output              278 ml  Net          2041.58 ml   Filed  Weights   04/19/17 0037 04/19/17 0345  Weight: 73 kg (161 lb) 78.2 kg (172 lb 6.4 oz)    Examination:  General exam: NAD Respiratory system: Anterior auscultation, Mild crackles at R lower base  Cardiovascular system: S1 & S2 heard, RRR. No JVD, murmurs, rubs or gallops Gastrointestinal system: Abdomen is nondistended, soft and nontender.  Central nervous system: Alert and oriented.  Extremities: No pedal edema.  Skin: No rashes, lesions or ulcers Psychiatry: Mood depressed   Data Reviewed: I have personally reviewed following labs and imaging studies  CBC:  Recent Labs Lab 04/15/17 0518 04/19/17 0105 04/19/17 0115 04/20/17 0543  WBC 16.2* 21.4*  --  15.8*  NEUTROABS  --  17.5*  --  12.6*  HGB 8.2* 8.0* 7.8* 6.9*  HCT 26.3* 25.3* 23.0* 21.6*  MCV 95.3 93.4  --  94.7  PLT 200 187  --  681   Basic Metabolic Panel:  Recent Labs Lab 04/15/17 0518 04/15/17 1556 04/19/17 0105 04/19/17 0115 04/20/17 0543  NA 142  --  141 141 142  K 2.7* 3.2* 3.4* 3.5 3.5  CL 110  --  106 110 111  CO2 23  --  24  --  21*  GLUCOSE 78  --  80 82 71  BUN 9  --  _1 CREATININE 0.81  --  0.73 0.80 0.68  CALCIUM 11.6*  --  12.4*  --  11.3*  MG 1.4*  --   --   --   --    GFR: Estimated Creatinine Clearance: 72.1 mL/min (by C-G formula based on SCr of 0.68 mg/dL). Liver Function Tests:  Recent Labs Lab 04/19/17 0105  AST 35  ALT 9*  ALKPHOS 65  BILITOT 0.7  PROT 8.0  ALBUMIN 2.3*    Recent Labs Lab 04/19/17 0117  INR 1.27    Recent Labs Lab 04/19/17 0115 04/19/17 0348 04/19/17 0533  PROCALCITON  --   --  0.94  LATICACIDVEN 2.16* 1.84  --     Recent Results (from the past 240 hour(s))  Culture, blood (Routine x 2)     Status: None   Collection Time: 04/12/17 12:39 PM  Result Value Ref Range Status   Specimen Description LEFT ANTECUBITAL  Final   Special Requests   Final    BOTTLES DRAWN AEROBIC AND ANAEROBIC Blood Culture results may not be optimal due to  an inadequate volume of blood received in culture bottles   Culture NO GROWTH 5 DAYS  Final   Report Status 04/17/2017 FINAL  Final  Culture, blood (Routine x 2)     Status: None   Collection Time: 04/12/17  1:27 PM  Result Value Ref Range Status   Specimen Description LEFT ANTECUBITAL  Final   Special Requests   Final    BOTTLES DRAWN AEROBIC AND ANAEROBIC Blood Culture results may not be optimal due to an inadequate volume of blood received in culture bottles   Culture NO GROWTH 5 DAYS  Final   Report Status 04/17/2017 FINAL  Final  C difficile quick scan w PCR reflex     Status: None   Collection Time: 04/15/17  9:05 AM  Result Value Ref Range Status   C Diff antigen NEGATIVE NEGATIVE Final   C Diff toxin NEGATIVE NEGATIVE Final   C Diff interpretation No C. difficile detected.  Final  Culture, blood (Routine x 2)     Status: None (Preliminary result)   Collection Time: 04/19/17  1:05 AM  Result Value Ref Range Status   Specimen Description BLOOD RIGHT ANTECUBITAL  Final   Special Requests   Final    BOTTLES DRAWN AEROBIC AND ANAEROBIC Blood Culture results may not be optimal due to an inadequate volume of blood received in culture bottles   Culture   Final    NO GROWTH 1 DAY Performed at [facility], [address] [place], [place] [address]    Report Status PENDING  Incomplete  Culture, blood (Routine x 2)     Status: None (Preliminary result)   Collection Time: 04/19/17  1:05 AM  Result Value Ref Range Status   Specimen Description BLOOD LEFT HAND  Final   Special Requests   Final    BOTTLES DRAWN AEROBIC AND ANAEROBIC Blood Culture adequate volume   Culture   Final    NO GROWTH 1 DAY Performed at [facility], [address] [place], [place] [address]    Report Status PENDING  Incomplete  Urine culture     Status: Abnormal   Collection Time: 04/19/17  2:58 AM  Result Value Ref Range Status   Specimen Description URINE, CLEAN CATCH  Final   Special  Requests NONE  Final   Culture 60,000 COLONIES/mL YEAST (A)  Final   Report Status 04/20/2017 FINAL  Final    Radiology Studies: Dg Chest 2 View  Result Date: 04/19/2017 CLINICAL DATA:  Acute onset of fever. Decreased O2 saturation. Initial encounter. EXAM: CHEST  2 VIEW COMPARISON:  Chest radiograph performed 04/12/2017 FINDINGS: The lungs are well-aerated. Increasing density of right perihilar airspace opacification raises concern for worsening pneumonia. Small bilateral pleural effusions are noted. No pneumothorax is seen. Chronic bilateral rib deformities are again noted, likely reflecting the patient's multiple myeloma. The heart is mildly enlarged. No acute osseous abnormalities are seen. There is chronic degenerative change at both glenohumeral joints. IMPRESSION: 1. Increasing density of right perihilar airspace opacification raises concern for worsening pneumonia. Followup PA and lateral chest X-ray is recommended in 3-4 weeks following trial of antibiotic therapy to ensure resolution and exclude underlying malignancy. 2. Small bilateral pleural effusions. 3. Mild cardiomegaly. Electronically Signed   By: Jeffery  Chang M.D.   On: 04/19/2017 00:26    Scheduled Meds: . aspirin EC  81 mg Oral Daily  . dextromethorphan-guaiFENesin  1 tablet Oral BID  . dorzolamide-timolol  1 drop Left Eye BID  . enoxaparin (LOVENOX) injection  40 mg Subcutaneous Q24H  . feeding supplement (ENSURE ENLIVE)  237 mL Oral BID BM  . feeding supplement (PRO-STAT SUGAR FREE 64)  30 mL Oral TID  . gabapentin  100 mg Oral Daily  .   latanoprost  1 drop Both Eyes QHS  . loratadine  10 mg Oral Daily  . mouth rinse  15 mL Mouth Rinse BID  . mirtazapine  30 mg Oral QHS  . multivitamin with minerals  1 tablet Oral Daily  . pantoprazole  40 mg Oral Daily   Continuous Infusions: . sodium chloride 125 mL/hr at 04/20/17 1613  . levofloxacin (LEVAQUIN) IV Stopped (04/20/17 1328)  . piperacillin-tazobactam (ZOSYN)  IV  3.375 g (04/20/17 1201)  . vancomycin 500 mg (04/20/17 1630)     LOS: 1 day    Edwin Silva, MD Pager: Text Page via [email or web address]  [phone]  If 7PM-7AM, please contact night-coverage [email or web address] Password [email or web address] 04/20/2017, 5:30 PM  

## 2017-04-20 NOTE — Progress Notes (Signed)
CRITICAL VALUE ALERT  Critical value received:  Potassium 2.6  Date of notification:  04/20/17  Time of notification:  2156  Critical value read back:Yes.    Nurse who received alert:  Clementeen Hoof, RN  MD notified (1st page):  D. Olevia Bowens   Time of first page:  2156  MD notified (2nd page):  Time of second page:  Responding MD:  D. Olevia Bowens  Time MD responded:  2128

## 2017-04-20 NOTE — Progress Notes (Signed)
PT Cancellation Note  Patient Details Name: Darlene Howard MRN: [id number] DOB: Oct 08, 1951   Cancelled Treatment:    Reason Eval/Treat Not Completed: Pain 8/10  limiting ability to participate, patient's HR at rest 123, BP 174/85, HGB 6.9. RN notified for pain medication. Will check back another time.    Marcelino Freestone PT [phone]  04/20/2017, 9:56 AM

## 2017-04-20 NOTE — Progress Notes (Addendum)
CRITICAL VALUE ALERT  Critical value received:  Hemoglobin 6.9  Date of notification:  04/20/2017  Time of notification:  0654  Critical value read back:Yes.    Nurse who received alert:  Randa Lynn RN  MD notified (1st page):  M. Lynch  Time of first page:  [phone]  MD notified (2nd page):  Time of second page:  Responding MD:  M. Donnal Debar  Time MD responded:  [phone]

## 2017-04-21 DIAGNOSIS — E876 Hypokalemia: Secondary | ICD-10-CM

## 2017-04-21 LAB — ALBUMIN: ALBUMIN: 2 g/dL — AB (ref 3.5–5.0)

## 2017-04-21 LAB — BASIC METABOLIC PANEL
ANION GAP: 11 (ref 5–15)
BUN: 6 mg/dL (ref 6–20)
CALCIUM: 11.7 mg/dL — AB (ref 8.9–10.3)
CO2: 26 mmol/L (ref 22–32)
CREATININE: 0.77 mg/dL (ref 0.44–1.00)
Chloride: 108 mmol/L (ref 101–111)
GFR calc Af Amer: >60 mL/min (ref >60)
GLUCOSE: 125 mg/dL — AB (ref 65–99)
Potassium: 2.9 mmol/L — ABNORMAL LOW (ref 3.5–5.1)
Sodium: 145 mmol/L (ref 135–145)

## 2017-04-21 LAB — PROCALCITONIN: PROCALCITONIN: 0.95 ng/mL

## 2017-04-21 LAB — VANCOMYCIN, TROUGH: Vancomycin Tr: 13 ug/mL — ABNORMAL LOW (ref 15–20)

## 2017-04-21 MED ORDER — FENTANYL CITRATE (PF) 100 MCG/2ML IJ SOLN
25.0000 ug | INTRAMUSCULAR | Status: DC | PRN
  Administered 2017-04-21 – 2017-04-24 (×7): 25 ug via INTRAVENOUS
  Filled 2017-04-21 (×8): qty 2

## 2017-04-21 MED ORDER — SODIUM CHLORIDE 0.9 % IV SOLN
Freq: Once | INTRAVENOUS | Status: DC
  Filled 2017-04-21: qty 1000

## 2017-04-21 MED ORDER — CALCITONIN (SALMON) 200 UNIT/ML IJ SOLN
4.0000 [IU]/kg | Freq: Two times a day (BID) | INTRAMUSCULAR | Status: AC
  Administered 2017-04-21 – 2017-04-23 (×4): 312 [IU] via SUBCUTANEOUS
  Filled 2017-04-21 (×4): qty 1.56

## 2017-04-21 MED ORDER — POTASSIUM CHLORIDE 10 MEQ/100ML IV SOLN
10.0000 meq | INTRAVENOUS | Status: AC
  Administered 2017-04-21 (×4): 10 meq via INTRAVENOUS
  Filled 2017-04-21 (×4): qty 100

## 2017-04-21 NOTE — Progress Notes (Signed)
OT Cancellation Note  Patient Details Name: Darlene Howard MRN: [id number] DOB: [birth year]   Cancelled Treatment:    Reason Eval/Treat Not Completed: Other (comment). Discussed with nsg. Pt having increased pain today. Pt appears to be sleeping at this time. Discussions tomorrow regarding plan of care and involvement of palliative. Will follow up as appropriate.   [facility], OT/L  [phone] 04/21/2017 Ellyanna Holton,HILLARY 04/21/2017, 2:24 PM

## 2017-04-21 NOTE — Progress Notes (Signed)
PT Cancellation Note  Patient Details Name: Darlene Howard MRN: [id number] DOB: 1951/04/05   Cancelled Treatment:    Reason Eval/Treat Not Completed: Medical issues which prohibited therapy (RN suggests to not attempt PT today. )   Claretha Cooper 04/21/2017, 2:23 PM Tresa Endo PT [phone]

## 2017-04-21 NOTE — Progress Notes (Signed)
PROGRESS NOTE [facility]   GRENDA LORA   ANV:[id number] DOB: Jun 24, 1951  DOA: 04/18/2017 PCP: [name], MD   Brief Narrative:  Darlene Howard is a 66 y.o. 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  The patient was recently hospitalized from 04/13 to 4/16 due to HCAP. She was treated with total of 14 days of total antibiotics. She has been doing OK until yesterday when she started having fever with temperature 100.4. She also has chills, generalized weakness, dry cough. She has mild SOB, but no chest pain. She also has bilateral leg weakness, left-sided flank pain. She denies nausea, vomiting, diarrhea, abdominal pain. No symptoms of UTI. Patient states that she went to [facility] on Tuesday for second opinion about her multiple myeloma treatment. They are planning to do new chemotherapy for her.  Subjective: Patient seen and examined, patient report doing ok. Overnight had mild respiratory distress, thought to be fluid overload getting Lasix. Patient was transfused PRBC overnight. Patient c/o back pain. Afebrile.   Assessment & Plan: Sepsis 2/2 HCAP - sepsis physiology resolved, CXR consistent with worsening PNA  Patient on triple abx coverage given high risk for MDR If no clinical improvement consider CT chest, will get CXR today  On Vanc, Zosyn and Levaquin will continue for now  Follow up blood cultures NGTD  Pro calcitonin still up    Leukocytosis trending down   UTI - yeast  Will give one dose of diflucan  Monitor    Hypercalcemia of malignancy - Corrected Ca 13.3 Patient received Zometa recently  Will start calcitonin  Patient also receiving lasix IVF had to be d/ced due to fluid overload May consider  low dose steroids  Multiple myeloma contributing to back and rob pain  Pain control as needed - Fentanyl adjusted  Will consult Dr. [name] on Monday for further plan of care  Patient may need palliative care management, patient declines at this time  PT/OT  Anemia of chronic diseases - due to malignancy  S/p 3 units of PRBC's  Hgb 11.4 Monitor CBC   Hypokalemia - due to lasix  Replete Check BMP in AM   DVT prophylaxis: Lovenox  Code Status: FULL  Family Communication: Family at bedside  Disposition Plan: Unclear at this time   Consultants:   None  Procedures:   None   Antimicrobials: Anti-infectives    Start     Dose/Rate Route Frequency Ordered Stop   04/20/17 1800  fluconazole (DIFLUCAN) tablet 200 mg     200 mg Oral  Once 04/20/17 1759 04/20/17 1938   04/19/17 1600  vancomycin (VANCOCIN) IVPB 1000 mg/200 mL premix  Status:  Discontinued     1,000 mg 200 mL/hr over 60 Minutes Intravenous Every 12 hours 04/19/17 0322 04/19/17 1212   04/19/17 1600  vancomycin (VANCOCIN) 500 mg in sodium chloride 0.9 % 100 mL IVPB     500 mg 100 mL/hr over 60 Minutes Intravenous Every 12 hours 04/19/17 1212     04/19/17 1000  levofloxacin (LEVAQUIN) IVPB 750 mg     750 mg 100 mL/hr over 90 Minutes Intravenous Every 24 hours 04/19/17 0831     04/19/17 0800  piperacillin-tazobactam (ZOSYN) IVPB 3.375 g     3.375 g 12.5 mL/hr over 240 Minutes Intravenous Every 8 hours 04/19/17 0322  04/19/17 0045  vancomycin (VANCOCIN) IVPB 1000 mg/200 mL premix     1,000 mg 200 mL/hr over 60 Minutes Intravenous  Once 04/19/17 0044 04/19/17 0302   04/19/17 0045  piperacillin-tazobactam (ZOSYN) IVPB 3.375 g     3.375 g 100 mL/hr over 30 Minutes Intravenous  Once 04/19/17 0044 04/19/17 0208      Objective: Vitals:   04/21/17 0818 04/21/17 0900 04/21/17 0935 04/21/17 1545  BP: (!) 165/85 (!) 171/90 (!) 182/93 (!) 154/86  Pulse: (!) 116 (!) 117 (!) 118 (!) 109  Resp: 18 (!) 22 (!) 22 20    Temp: 98.1 F (36.7 C) 98.3 F (36.8 C) 98 F (36.7 C) 98 F (36.7 C)  TempSrc: Oral Oral Oral Oral  SpO2: 94% 97% 94% 94%  Weight:      Height:        Intake/Output Summary (Last 24 hours) at 04/21/17 1721 Last data filed at 04/21/17 1120  Gross per 24 hour  Intake             1576 ml  Output                3 ml  Net             1573 ml   Filed Weights   04/19/17 0037 04/19/17 0345  Weight: 73 kg (161 lb) 78.2 kg (172 lb 6.4 oz)    Examination:  General exam: NAD Respiratory system: Anterior fields, diffuse crackles b/l, no wheezing Cardiovascular system: S1S2 no murmurs  Gastrointestinal system: Abd sof NTND  Central nervous system: AAOx4.  Extremities: Trace pedal edema Skin: No lesions  Psychiatry: Mood depressed and flat    Data Reviewed: I have personally reviewed following labs and imaging studies  CBC:  Recent Labs Lab 04/15/17 0518 04/19/17 0105 04/19/17 0115 04/20/17 0543 04/20/17 2057 04/21/17 1521  WBC 16.2* 21.4*  --  15.8*  --  11.8*  NEUTROABS  --  17.5*  --  12.6*  --  9.5*  HGB 8.2* 8.0* 7.8* 6.9* 7.1* 11.4*  HCT 26.3* 25.3* 23.0* 21.6* 23.3* 33.6*  MCV 95.3 93.4  --  94.7  --  88.0  PLT 200 187  --  150  --  818*   Basic Metabolic Panel:  Recent Labs Lab 04/15/17 0518  04/19/17 0105 04/19/17 0115 04/20/17 0543 04/20/17 2057 04/21/17 1521  NA 142  --  141 141 142 140 145  K 2.7*  < > 3.4* 3.5 3.5 2.6* 2.9*  CL 110  --  106 110 111 109 108  CO2 23  --  24  --  21* 21* 26  GLUCOSE 78  --  80 82 71 81 125*  BUN 9  --  _0 CREATININE 0.81  --  0.73 0.80 0.68 0.68 0.77  CALCIUM 11.6*  --  12.4*  --  11.3* 11.5* 11.7*  MG 1.4*  --   --   --   --  1.4*  --   < > = values in this interval not displayed. GFR: Estimated Creatinine Clearance: 72.1 mL/min (by C-G formula based on SCr of 0.77 mg/dL). Liver Function Tests:  Recent Labs Lab 04/19/17 0105 04/21/17 1521  AST 35  --   ALT 9*  --   ALKPHOS 65  --   BILITOT  0.7  --   PROT 8.0  --   ALBUMIN 2.3* 2.0*    Recent Labs Lab 04/19/17 0117  INR 1.27  Recent Labs Lab 04/19/17 0115 04/19/17 0348 04/19/17 0533 04/21/17 1521  PROCALCITON  --   --  0.94 0.95  LATICACIDVEN 2.16* 1.84  --   --     Recent Results (from the past 240 hour(s))  Culture, blood (Routine x 2)     Status: None   Collection Time: 04/12/17 12:39 PM  Result Value Ref Range Status   Specimen Description LEFT ANTECUBITAL  Final   Special Requests   Final    BOTTLES DRAWN AEROBIC AND ANAEROBIC Blood Culture results may not be optimal due to an inadequate volume of blood received in culture bottles   Culture NO GROWTH 5 DAYS  Final   Report Status 04/17/2017 FINAL  Final  Culture, blood (Routine x 2)     Status: None   Collection Time: 04/12/17  1:27 PM  Result Value Ref Range Status   Specimen Description LEFT ANTECUBITAL  Final   Special Requests   Final    BOTTLES DRAWN AEROBIC AND ANAEROBIC Blood Culture results may not be optimal due to an inadequate volume of blood received in culture bottles   Culture NO GROWTH 5 DAYS  Final   Report Status 04/17/2017 FINAL  Final  C difficile quick scan w PCR reflex     Status: None   Collection Time: 04/15/17  9:05 AM  Result Value Ref Range Status   C Diff antigen NEGATIVE NEGATIVE Final   C Diff toxin NEGATIVE NEGATIVE Final   C Diff interpretation No C. difficile detected.  Final  Culture, blood (Routine x 2)     Status: None (Preliminary result)   Collection Time: 04/19/17  1:05 AM  Result Value Ref Range Status   Specimen Description BLOOD RIGHT ANTECUBITAL  Final   Special Requests   Final    BOTTLES DRAWN AEROBIC AND ANAEROBIC Blood Culture results may not be optimal due to an inadequate volume of blood received in culture bottles   Culture   Final    NO GROWTH 2 DAYS Performed at [facility] [address] [place], [place] [address]    Report Status PENDING  Incomplete  Culture, blood (Routine x 2)      Status: None (Preliminary result)   Collection Time: 04/19/17  1:05 AM  Result Value Ref Range Status   Specimen Description BLOOD LEFT HAND  Final   Special Requests   Final    BOTTLES DRAWN AEROBIC AND ANAEROBIC Blood Culture adequate volume   Culture   Final    NO GROWTH 2 DAYS Performed at [facility], [place] [address], [place], [place] [address]    Report Status PENDING  Incomplete  Urine culture     Status: Abnormal   Collection Time: 04/19/17  2:58 AM  Result Value Ref Range Status   Specimen Description URINE, CLEAN CATCH  Final   Special Requests NONE  Final   Culture 60,000 COLONIES/mL YEAST (A)  Final   Report Status 04/20/2017 FINAL  Final    Radiology Studies: No results found.  Scheduled Meds: . aspirin EC  81 mg Oral Daily  . dextromethorphan-guaiFENesin  1 tablet Oral BID  . dorzolamide-timolol  1 drop Left Eye BID  . enoxaparin (LOVENOX) injection  40 mg Subcutaneous Q24H  . feeding supplement (ENSURE ENLIVE)  237 mL Oral BID BM  . feeding supplement (PRO-STAT SUGAR FREE 64)  30 mL Oral TID  . gabapentin  100 mg Oral Daily  . latanoprost  1 drop Both  Eyes QHS  . loratadine  10 mg Oral Daily  . mouth rinse  15 mL Mouth Rinse BID  . mirtazapine  30 mg Oral QHS  . multivitamin with minerals  1 tablet Oral Daily  . pantoprazole  40 mg Oral Daily  . potassium chloride  40 mEq Oral TID   Continuous Infusions: . sodium chloride    . levofloxacin (LEVAQUIN) IV Stopped (04/21/17 1227)  . piperacillin-tazobactam (ZOSYN)  IV 3.375 g (04/21/17 1555)  . vancomycin Stopped (04/21/17 0535)     LOS: 2 days    Chipper Oman, MD Pager: Text Page via [email or web address]  [phone]  If 7PM-7AM, please contact night-coverage [email or web address] Password [facility] 04/21/2017, 5:21 PM

## 2017-04-21 NOTE — Progress Notes (Signed)
Pharmacy Antibiotic Note  Darlene Howard is a 66 y.o. female with hx of multiple myeloma c/o weakness, chills and fever admitted on 04/18/2017 with pneumonia.  Pharmacy has been consulted for zosyn/vancomycin dosing initially, now requested to add Levaquin due to patient's risk for MDR organisms.  4/22 VT = 13 mcg/ml on 500 mg IV q12h  Plan: Will continue Vancomycin 500 mg IV q12h. Think patient will accumulate as during recent admission. Continue Levaquin 750 mg IV q24h. Continue Zosyn 3.375g IV q8h (4 hour infusion time).  Daily SCr. F/u cultures, f/u narrowing of abx.  Height: _0  (167.6 cm) Weight: 172 lb 6.4 oz (78.2 kg) IBW/kg (Calculated) : 59.3  Temp (24hrs), Avg:98.2 F (36.8 C), Min:97.6 F (36.4 C), Max:98.7 F (37.1 C)   Recent Labs Lab 04/15/17 0518 04/19/17 0105 04/19/17 0115 04/19/17 0348 04/20/17 0543 04/20/17 2057 04/21/17 1521  WBC 16.2* 21.4*  --   --  15.8*  --  11.8*  CREATININE 0.81 0.73 0.80  --  0.68 0.68 0.77  LATICACIDVEN  --   --  2.16* 1.84  --   --   --   VANCOTROUGH 25*  --   --   --   --   --  13*    Estimated Creatinine Clearance: 72.1 mL/min (by C-G formula based on SCr of 0.77 mg/dL).    Allergies  Allergen Reactions  . Codeine Nausea And Vomiting  . Tramadol Nausea And Vomiting    Antimicrobials this admission:  4/20 Vanc >> 4/20 Zosyn >> 4/20 Levaquin >> 4/22 fluconazole x 1  Dose adjustments this admission:  4/22 VT = 13 mcg/ml on 500 mg IV q12h  Microbiology results:  4/20 BCx: ngtd 4/20 UCx: yeast 4/20 Sputum:   4/20 influenza neg 4/20 strep/leg ur ag: neg/IP  Thank you for allowing pharmacy to be a part of this patient's care.  Hershal Coria 04/21/2017 5:36 PM

## 2017-04-22 ENCOUNTER — Telehealth: Payer: Self-pay | Admitting: Medical Oncology

## 2017-04-22 DIAGNOSIS — C9002 Multiple myeloma in relapse: Secondary | ICD-10-CM

## 2017-04-22 LAB — CBC WITH DIFFERENTIAL/PLATELET
BASOS ABS: 0 10*3/uL (ref 0.0–0.1)
BASOS ABS: 0 10*3/uL (ref 0.0–0.1)
BASOS PCT: 0 %
Basophils Relative: 0 %
EOS ABS: 0 10*3/uL (ref 0.0–0.7)
EOS ABS: 0 10*3/uL (ref 0.0–0.7)
Eosinophils Relative: 0 %
Eosinophils Relative: 0 %
HCT: 33.6 % — ABNORMAL LOW (ref 36.0–46.0)
HEMATOCRIT: 34.6 % — AB (ref 36.0–46.0)
HEMOGLOBIN: 11.4 g/dL — AB (ref 12.0–15.0)
HEMOGLOBIN: 11.9 g/dL — AB (ref 12.0–15.0)
LYMPHS ABS: 1.1 10*3/uL (ref 0.7–4.0)
LYMPHS PCT: 9 %
Lymphocytes Relative: 9 %
Lymphs Abs: 1.3 10*3/uL (ref 0.7–4.0)
MCH: 29.8 pg (ref 26.0–34.0)
MCH: 30.4 pg (ref 26.0–34.0)
MCHC: 33.9 g/dL (ref 30.0–36.0)
MCHC: 34.4 g/dL (ref 30.0–36.0)
MCV: 88 fL (ref 78.0–100.0)
MCV: 88.3 fL (ref 78.0–100.0)
MONO ABS: 1.2 10*3/uL — AB (ref 0.1–1.0)
MONOS PCT: 10 %
Monocytes Absolute: 1.4 10*3/uL — ABNORMAL HIGH (ref 0.1–1.0)
Monocytes Relative: 10 %
NEUTROS ABS: 9.5 10*3/uL — AB (ref 1.7–7.7)
NEUTROS PCT: 81 %
Neutro Abs: 11.7 10*3/uL — ABNORMAL HIGH (ref 1.7–7.7)
Neutrophils Relative %: 81 %
PLATELETS: 145 10*3/uL — AB (ref 150–400)
Platelets: 163 10*3/uL (ref 150–400)
RBC: 3.82 MIL/uL — ABNORMAL LOW (ref 3.87–5.11)
RBC: 3.92 MIL/uL (ref 3.87–5.11)
RDW: 18.3 % — AB (ref 11.5–15.5)
RDW: 19 % — ABNORMAL HIGH (ref 11.5–15.5)
WBC: 11.8 10*3/uL — ABNORMAL HIGH (ref 4.0–10.5)
WBC: 14.4 10*3/uL — ABNORMAL HIGH (ref 4.0–10.5)

## 2017-04-22 LAB — BASIC METABOLIC PANEL
ANION GAP: 11 (ref 5–15)
BUN: 6 mg/dL (ref 6–20)
CALCIUM: 11.8 mg/dL — AB (ref 8.9–10.3)
CO2: 24 mmol/L (ref 22–32)
Chloride: 107 mmol/L (ref 101–111)
Creatinine, Ser: 0.73 mg/dL (ref 0.44–1.00)
Glucose, Bld: 131 mg/dL — ABNORMAL HIGH (ref 65–99)
POTASSIUM: 3.2 mmol/L — AB (ref 3.5–5.1)
Sodium: 142 mmol/L (ref 135–145)

## 2017-04-22 LAB — MRSA PCR SCREENING: MRSA BY PCR: NEGATIVE

## 2017-04-22 LAB — LEGIONELLA PNEUMOPHILA SEROGP 1 UR AG: L. pneumophila Serogp 1 Ur Ag: NEGATIVE

## 2017-04-22 LAB — ALBUMIN: ALBUMIN: 2.2 g/dL — AB (ref 3.5–5.0)

## 2017-04-22 MED ORDER — FUROSEMIDE 10 MG/ML IJ SOLN
20.0000 mg | Freq: Once | INTRAMUSCULAR | Status: AC
  Administered 2017-04-22: 20 mg via INTRAVENOUS
  Filled 2017-04-22: qty 2

## 2017-04-22 MED ORDER — HYDRALAZINE HCL 20 MG/ML IJ SOLN: 5.0000 mg | Freq: Once | INTRAMUSCULAR | Status: DC

## 2017-04-22 NOTE — Telephone Encounter (Signed)
Left below message on pts VM.

## 2017-04-22 NOTE — Care Management Note (Signed)
Case Management Note  Patient Details  Name: Darlene Howard MRN: [id number] Date of Birth: [birth year]  Subjective/Objective: 66 y/o f admitted w/Sepsis. From home. DNR. Active w/[facility] HHPT/OT. OT-recc SNF. Await PT eval.                   Action/Plan:current d/c plan home.   Expected Discharge Date:                  Expected Discharge Plan:  Skilled Nursing Facility  In-House Referral:  Clinical Social Work  Discharge planning Services  CM Consult  Post Acute Care Choice:  Home Health (Active w/[facility] HHPT/OT) Choice offered to:     DME Arranged:    DME Agency:     HH Arranged:    [place] Agency:     Status of Service:  In process, will continue to follow  If discussed at Long Length of Stay Meetings, dates discussed:    Additional Comments:  Dessa Phi, RN 04/22/2017, 2:47 PM

## 2017-04-22 NOTE — Evaluation (Addendum)
Occupational Therapy Evaluation Patient Details Name: Darlene Howard MRN: [id number] DOB: 1951/06/17 Today's Date: 04/22/2017    History of Present Illness Darlene Howard is a 66 y.o. 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  The patient was recently hospitalized from 04/13 to 4/16 due to HCAP.      Clinical Impression   Pt admitted with  Back and leg weakness. Pt currently with functional limitations due to the deficits listed below (see OT Problem List). Pt will benefit from skilled OT to increase their safety and independence with ADL and functional mobility for ADL to facilitate discharge to venue listed below.      Follow Up Recommendations  SNF;Home health OT;Supervision/Assistance - 24 hour          Precautions / Restrictions Precautions Precautions: Fall      Mobility Bed Mobility Overal bed mobility: Needs Assistance Bed Mobility: Rolling;Supine to Sit;Sit to Supine;Sidelying to Sit;Sit to Sidelying Rolling: Mod assist Sidelying to sit: Total assist Supine to sit: Total assist Sit to supine: Total assist Sit to sidelying: Total assist General bed mobility comments: pt needed significant A with mobility.  Pt only able to sit EOB briefly before needing to return to supine  Transfers                 General transfer comment: unable    Balance Overall balance assessment: Needs assistance Sitting-balance support: Bilateral upper extremity supported Sitting balance-Leahy Scale: Poor                                     ADL either performed or assessed with clinical judgement   ADL Overall ADL's : Needs assistance/impaired Eating/Feeding: Moderate assistance;Bed level Eating/Feeding Details  (indicate cue type and reason): HOB raised Grooming: Moderate assistance;Bed level Grooming Details (indicate cue type and reason): HOB raised                               General ADL Comments: Pt had a hard time sitting EOB.  Pt leaning back and unable to obtain balance. Pts nephew pesent and Aed OT but pt needed significant A.  Explained how much A pt needed to sit EOB.  Pt may need SNF upon DC.                    Pertinent Vitals/Pain Pain Score: 6  Pain Location: back Pain Intervention(s): Monitored during session;Repositioned;Limited activity within patient's tolerance     Hand Dominance Right   Extremity/Trunk Assessment Upper Extremity Assessment Upper Extremity Assessment: Generalized weakness (Pts BUe very weak- pt has been having trouble feeding self.  Pt will need significant A with ADL activity at home)           Communication Communication Communication: No difficulties   Cognition Arousal/Alertness: Awake/alert Behavior During Therapy: WFL for tasks assessed/performed Overall Cognitive Status: Within Functional Limits for tasks assessed                                     General Comments  Home Living Family/patient expects to be discharged to:: Private residence Living Arrangements:  ([name], and [name]) Available Help at Discharge: Available 24 hours/day;Family [name] is the caregiver.  [name] is handicap) Type of Home: House Home Access: Ramped entrance     Home Layout: Multi-level;Able to live on main level with bedroom/bathroom     Bathroom Shower/Tub: Teacher, early years/pre: Standard     Home Equipment: Cane - single point;Walker - 2 wheels;Wheelchair - manual          Prior Functioning/Environment Level of Independence: Needs assistance  Gait / Transfers Assistance Needed: Pt uses the RW and w/c for mobility prior to this admission.  Most of the time [name] her sister is  present when she is walking.  ADL's / Homemaking Assistance Needed: assistance with dressing, and bathing.             OT Problem List: Decreased strength;Decreased activity tolerance;Impaired balance (sitting and/or standing);Decreased knowledge of use of DME or AE;Pain      OT Treatment/Interventions: Self-care/ADL training;DME and/or AE instruction;Balance training;Energy conservation;Therapeutic exercise;Therapeutic activities    OT Goals(Current goals can be found in the care plan section) Acute Rehab OT Goals Patient Stated Goal: go home with family OT Goal Formulation: With patient Time For Goal Achievement: 05/06/17 Potential to Achieve Goals: Fair  OT Frequency: Min 2X/week   Barriers to D/C:               End of Session Nurse Communication: Mobility status  Activity Tolerance: Patient limited by pain Patient left: in bed;with bed alarm set;with family/visitor present  OT Visit Diagnosis: History of falling (Z91.81);Pain                  Charges:  OT General Charges $OT Visit: 1 Procedure OT Evaluation $OT Eval Moderate Complexity: 1 Procedure OT Treatments $Self Care/Home Management : 8-22 mins G-Codes:     Kari Baars, [place]   Payton Mccallum D 04/22/2017, 2:18 PM

## 2017-04-22 NOTE — Telephone Encounter (Signed)
-----  Message from Curt Bears, MD sent at 04/21/2017 10:22 PM EDT ----- Please call Ms. Catlin and let her know that the MRI showed compression of her cord but several bone lesions expected with her multiple Myeloma. Will continue our plan as discussed last visit. Thank you.

## 2017-04-22 NOTE — Progress Notes (Signed)
PROGRESS NOTE [facility]   Darlene Howard   GMW:[id number] DOB: March 23, 1951  DOA: 04/18/2017 PCP: [name], MD   Brief Narrative:  Darlene Howard is a 66 y.o. 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  The patient was recently hospitalized from 04/13 to 4/16 due to HCAP. She was treated with total of 14 days of total antibiotics. She has been doing OK until yesterday when she started having fever with temperature 100.4. She also has chills, generalized weakness, dry cough. She has mild SOB, but no chest pain. She also has bilateral leg weakness, left-sided flank pain. She denies nausea, vomiting, diarrhea, abdominal pain. No symptoms of UTI. Patient states that she went to [facility] on Tuesday for second opinion about her multiple myeloma treatment. They are planning to do new chemotherapy for her.  Subjective: Patient seen and examined, she looks much better today, still c/o pain but fentanyl helping. Patient more energetic today.   Assessment & Plan: Sepsis 2/2 HCAP - sepsis physiology resolved, CXR consistent with worsening PNA  Patient on triple abx coverage given high risk for MDR Clinically improving  On Zosyn and Levaquin will continue for now, Vanco d/ced give MRSA PCR negative  Follow up blood cultures NGTD  Leukocytosis trending down   UTI - yeast  Treated with 1 dose of Diflucan  Monitor    Hypercalcemia of malignancy - Corrected Ca 13.2 Patient received Zometa recently  Continue calcitonin  Patient also got lasix  IVF had to be d/ced due to fluid overload If no improvement by tomorrow will consider low dose steroids  Multiple myeloma contributing to back and rib pain  Pain control as needed -  Fentanyl adjusted  Oncology consulted, they will see patient today  Patient may need palliative care management, patient declines at this time  PT/OT  Anemia of chronic diseases - due to malignancy  S/p 3 units of PRBC's  Hgb stable  Monitor CBC   Hypokalemia - due to lasix  Replete Check BMP in AM  Check Magnesium in AM   DVT prophylaxis: Lovenox  Code Status: FULL  Family Communication: Family at bedside  Disposition Plan: Unclear at this time   Consultants:   None  Procedures:   None   Antimicrobials: Anti-infectives    Start     Dose/Rate Route Frequency Ordered Stop   04/20/17 1800  fluconazole (DIFLUCAN) tablet 200 mg     200 mg Oral  Once 04/20/17 1759 04/20/17 1938   04/19/17 1600  vancomycin (VANCOCIN) IVPB 1000 mg/200 mL premix  Status:  Discontinued     1,000 mg 200 mL/hr over 60 Minutes Intravenous Every 12 hours 04/19/17 0322 04/19/17 1212   04/19/17 1600  vancomycin (VANCOCIN) 500 mg in sodium chloride 0.9 % 100 mL IVPB  Status:  Discontinued     500 mg 100 mL/hr over 60 Minutes Intravenous Every 12 hours 04/19/17 1212 04/22/17 1259   04/19/17 1000  levofloxacin (LEVAQUIN) IVPB 750 mg     750 mg 100 mL/hr over 90 Minutes Intravenous Every 24 hours 04/19/17 0831     04/19/17 0800  piperacillin-tazobactam (ZOSYN) IVPB 3.375 g     3.375 g 12.5 mL/hr over 240 Minutes Intravenous Every 8 hours 04/19/17 0322     04/19/17 0045  vancomycin (VANCOCIN) IVPB 1000  mg/200 mL premix     1,000 mg 200 mL/hr over 60 Minutes Intravenous  Once 04/19/17 0044 04/19/17 0302   04/19/17 0045  piperacillin-tazobactam (ZOSYN) IVPB 3.375 g     3.375 g 100 mL/hr over 30 Minutes Intravenous  Once 04/19/17 0044 04/19/17 0208      Objective: Vitals:   04/21/17 1545 04/22/17 0426 04/22/17 0434 04/22/17 1418  BP: (!) 154/86 (!) 181/107 (!) 178/94 (!) 164/97  Pulse: (!) 109 (!) 120  (!) 116  Resp: 20 (!) 23  (!) 22  Temp: 98 F (36.7 C) 98.5 F (36.9 C)  98.5 F (36.9 C)    TempSrc: Oral Oral  Oral  SpO2: 94% 98%  98%  Weight:      Height:        Intake/Output Summary (Last 24 hours) at 04/22/17 1704 Last data filed at 04/22/17 1300  Gross per 24 hour  Intake             1230 ml  Output                0 ml  Net             1230 ml   Filed Weights   04/19/17 0037 04/19/17 0345  Weight: 73 kg (161 lb) 78.2 kg (172 lb 6.4 oz)    Examination:  General exam: Calm, good spirits  Respiratory system: Anterior good air entry, no rales appreciated, mild crackles at the bases  Cardiovascular system: Regular  Gastrointestinal system: Abd sof NTND  Central nervous system: Non focal   Extremities: No LE edema  Skin: No rashes  Psychiatry: Mood appropriate   Data Reviewed: I have personally reviewed following labs and imaging studies  CBC:  Recent Labs Lab 04/19/17 0105 04/19/17 0115 04/20/17 0543 04/20/17 2057 04/21/17 1521 04/22/17 0550  WBC 21.4*  --  15.8*  --  11.8* 14.4*  NEUTROABS 17.5*  --  12.6*  --  9.5* 11.7*  HGB 8.0* 7.8* 6.9* 7.1* 11.4* 11.9*  HCT 25.3* 23.0* 21.6* 23.3* 33.6* 34.6*  MCV 93.4  --  94.7  --  88.0 88.3  PLT 187  --  150  --  145* 920   Basic Metabolic Panel:  Recent Labs Lab 04/19/17 0105 04/19/17 0115 04/20/17 0543 04/20/17 2057 04/21/17 1521 04/22/17 0550  NA 141 141 142 140 145 142  K 3.4* 3.5 3.5 2.6* 2.9* 3.2*  CL 106 110 111 109 108 107  CO2 24  --  21* 21* 26 24  GLUCOSE 80 82 71 81 125* 131*  BUN '12 10 10 7 6 6  ' CREATININE 0.73 0.80 0.68 0.68 0.77 0.73  CALCIUM 12.4*  --  11.3* 11.5* 11.7* 11.8*  MG  --   --   --  1.4*  --   --    GFR: Estimated Creatinine Clearance: 72.1 mL/min (by C-G formula based on SCr of 0.73 mg/dL). Liver Function Tests:  Recent Labs Lab 04/19/17 0105 04/21/17 1521 04/22/17 0550  AST 35  --   --   ALT 9*  --   --   ALKPHOS 65  --   --   BILITOT 0.7  --   --   PROT 8.0  --   --   ALBUMIN 2.3* 2.0* 2.2*    Recent Labs Lab 04/19/17 0117  INR 1.27     Recent Labs Lab 04/19/17 0115 04/19/17 0348 04/19/17 0533 04/21/17 1521  PROCALCITON  --   --  0.94 0.95  LATICACIDVEN 2.16* 1.84  --   --     Recent Results (from the past 240 hour(s))  C difficile quick scan w PCR reflex     Status: None   Collection Time: 04/15/17  9:05 AM  Result Value Ref Range Status   C Diff antigen NEGATIVE NEGATIVE Final   C Diff toxin NEGATIVE NEGATIVE Final   C Diff interpretation No C. difficile detected.  Final  Culture, blood (Routine x 2)     Status: None (Preliminary result)   Collection Time: 04/19/17  1:05 AM  Result Value Ref Range Status   Specimen Description BLOOD RIGHT ANTECUBITAL  Final   Special Requests   Final    BOTTLES DRAWN AEROBIC AND ANAEROBIC Blood Culture results may not be optimal due to an inadequate volume of blood received in culture bottles   Culture   Final    NO GROWTH 3 DAYS Performed at [facility], [place] [address], [place], [place] [address]    Report Status PENDING  Incomplete  Culture, blood (Routine x 2)     Status: None (Preliminary result)   Collection Time: 04/19/17  1:05 AM  Result Value Ref Range Status   Specimen Description BLOOD LEFT HAND  Final   Special Requests   Final    BOTTLES DRAWN AEROBIC AND ANAEROBIC Blood Culture adequate volume   Culture   Final    NO GROWTH 3 DAYS Performed at [facility], [place] [address], [place], [place] [address]    Report Status PENDING  Incomplete  Urine culture     Status: Abnormal   Collection Time: 04/19/17  2:58 AM  Result Value Ref Range Status   Specimen Description URINE, CLEAN CATCH  Final   Special Requests NONE  Final   Culture 60,000 COLONIES/mL YEAST (A)  Final   Report Status 04/20/2017 FINAL  Final  MRSA PCR Screening     Status: None   Collection Time: 04/22/17  8:53 AM  Result Value Ref Range Status   MRSA by PCR NEGATIVE NEGATIVE Final    Comment:        The GeneXpert MRSA Assay (FDA approved for NASAL specimens only), is  one component of a comprehensive MRSA colonization surveillance program. It is not intended to diagnose MRSA infection nor to guide or monitor treatment for MRSA infections.     Radiology Studies: No results found.  Scheduled Meds: . aspirin EC  81 mg Oral Daily  . calcitonin  4 Units/kg Subcutaneous Q12H  . dextromethorphan-guaiFENesin  1 tablet Oral BID  . dorzolamide-timolol  1 drop Left Eye BID  . enoxaparin (LOVENOX) injection  40 mg Subcutaneous Q24H  . feeding supplement (ENSURE ENLIVE)  237 mL Oral BID BM  . feeding supplement (PRO-STAT SUGAR FREE 64)  30 mL Oral TID  . gabapentin  100 mg Oral Daily  . latanoprost  1 drop Both Eyes QHS  . loratadine  10 mg Oral Daily  . mouth rinse  15 mL Mouth Rinse BID  . mirtazapine  30 mg Oral QHS  . multivitamin with minerals  1 tablet Oral Daily  . pantoprazole  40 mg Oral Daily   Continuous Infusions: . sodium chloride    . levofloxacin (LEVAQUIN) IV Stopped (04/22/17 1038)  . piperacillin-tazobactam (ZOSYN)  IV Stopped (04/22/17 1141)  . 0.9 % sodium chloride with kcl Stopped (04/22/17 0443)     LOS: 3 days    Chipper Oman, MD Pager:  Text Page via [email or web address]  [phone]  If 7PM-7AM, please contact night-coverage [email or web address] Password [facility] 04/22/2017, 5:04 PM

## 2017-04-22 NOTE — Progress Notes (Signed)
PT Cancellation Note  Patient Details Name: Darlene Howard MRN: [id number] DOB: [birth year]   Cancelled Treatment:    Reason Eval/Treat Not Completed: Pain limiting ability to participatePer OT, difficult eval with OT. Will check back tomorrow.   Marcelino Freestone PT [phone]  04/22/2017, 4:38 PM

## 2017-04-23 ENCOUNTER — Telehealth: Payer: Self-pay | Admitting: Family Medicine

## 2017-04-23 DIAGNOSIS — G8929 Other chronic pain: Secondary | ICD-10-CM | POA: Diagnosis not present

## 2017-04-23 DIAGNOSIS — R5383 Other fatigue: Secondary | ICD-10-CM

## 2017-04-23 DIAGNOSIS — R531 Weakness: Secondary | ICD-10-CM

## 2017-04-23 DIAGNOSIS — E43 Unspecified severe protein-calorie malnutrition: Secondary | ICD-10-CM

## 2017-04-23 DIAGNOSIS — E119 Type 2 diabetes mellitus without complications: Secondary | ICD-10-CM | POA: Diagnosis not present

## 2017-04-23 DIAGNOSIS — K760 Fatty (change of) liver, not elsewhere classified: Secondary | ICD-10-CM | POA: Diagnosis not present

## 2017-04-23 DIAGNOSIS — M5410 Radiculopathy, site unspecified: Secondary | ICD-10-CM | POA: Diagnosis not present

## 2017-04-23 DIAGNOSIS — C9000 Multiple myeloma not having achieved remission: Secondary | ICD-10-CM

## 2017-04-23 DIAGNOSIS — I10 Essential (primary) hypertension: Secondary | ICD-10-CM | POA: Diagnosis not present

## 2017-04-23 DIAGNOSIS — J189 Pneumonia, unspecified organism: Secondary | ICD-10-CM

## 2017-04-23 LAB — COMPREHENSIVE METABOLIC PANEL
ALK PHOS: 73 U/L (ref 38–126)
ALT: 9 U/L — AB (ref 14–54)
AST: 29 U/L (ref 15–41)
Albumin: 2.1 g/dL — ABNORMAL LOW (ref 3.5–5.0)
Anion gap: 12 (ref 5–15)
BUN: 6 mg/dL (ref 6–20)
CALCIUM: 11.3 mg/dL — AB (ref 8.9–10.3)
CHLORIDE: 106 mmol/L (ref 101–111)
CO2: 26 mmol/L (ref 22–32)
CREATININE: 0.69 mg/dL (ref 0.44–1.00)
GFR calc Af Amer: >60 mL/min (ref >60)
Glucose, Bld: 104 mg/dL — ABNORMAL HIGH (ref 65–99)
Potassium: 2.5 mmol/L — CL (ref 3.5–5.1)
Sodium: 144 mmol/L (ref 135–145)
Total Bilirubin: 0.7 mg/dL (ref 0.3–1.2)
Total Protein: 7.6 g/dL (ref 6.5–8.1)

## 2017-04-23 LAB — CBC WITH DIFFERENTIAL/PLATELET
Basophils Absolute: 0 10*3/uL (ref 0.0–0.1)
Basophils Relative: 0 %
EOS PCT: 1 %
Eosinophils Absolute: 0.1 10*3/uL (ref 0.0–0.7)
HEMATOCRIT: 32.9 % — AB (ref 36.0–46.0)
Hemoglobin: 11.2 g/dL — ABNORMAL LOW (ref 12.0–15.0)
LYMPHS ABS: 1.4 10*3/uL (ref 0.7–4.0)
Lymphocytes Relative: 11 %
MCH: 30.5 pg (ref 26.0–34.0)
MCHC: 34 g/dL (ref 30.0–36.0)
MCV: 89.6 fL (ref 78.0–100.0)
MONO ABS: 1 10*3/uL (ref 0.1–1.0)
MONOS PCT: 8 %
NEUTROS ABS: 10 10*3/uL — AB (ref 1.7–7.7)
Neutrophils Relative %: 80 %
Platelets: 157 10*3/uL (ref 150–400)
RBC: 3.67 MIL/uL — ABNORMAL LOW (ref 3.87–5.11)
RDW: 19.3 % — AB (ref 11.5–15.5)
WBC: 12.5 10*3/uL — ABNORMAL HIGH (ref 4.0–10.5)

## 2017-04-23 LAB — TYPE AND SCREEN
ABO/RH(D): A POS
ANTIBODY SCREEN: NEGATIVE
UNIT DIVISION: 0
Unit division: 0
Unit division: 0

## 2017-04-23 LAB — BPAM RBC
BLOOD PRODUCT EXPIRATION DATE: 201805102359
Blood Product Expiration Date: 201805102359
Blood Product Expiration Date: 201805102359
ISSUE DATE / TIME: 201804212256
ISSUE DATE / TIME: 201804220240
ISSUE DATE / TIME: 201804220909
UNIT TYPE AND RH: 6200
Unit Type and Rh: 6200
Unit Type and Rh: 6200

## 2017-04-23 LAB — MAGNESIUM: MAGNESIUM: 1.6 mg/dL — AB (ref 1.7–2.4)

## 2017-04-23 MED ORDER — PREDNISONE 20 MG PO TABS
20.0000 mg | ORAL_TABLET | Freq: Every day | ORAL | Status: DC
  Administered 2017-04-24 – 2017-04-25 (×2): 20 mg via ORAL
  Filled 2017-04-23 (×2): qty 1

## 2017-04-23 MED ORDER — OXYCODONE-ACETAMINOPHEN 5-325 MG PO TABS
2.0000 | ORAL_TABLET | ORAL | Status: DC | PRN
  Administered 2017-04-23 – 2017-04-25 (×11): 2 via ORAL
  Filled 2017-04-23 (×11): qty 2

## 2017-04-23 MED ORDER — MAGNESIUM SULFATE 2 GM/50ML IV SOLN
2.0000 g | Freq: Once | INTRAVENOUS | Status: AC
  Administered 2017-04-23: 2 g via INTRAVENOUS
  Filled 2017-04-23: qty 50

## 2017-04-23 MED ORDER — PRO-STAT SUGAR FREE PO LIQD
30.0000 mL | Freq: Two times a day (BID) | ORAL | Status: DC
  Administered 2017-04-23: 30 mL via ORAL
  Filled 2017-04-23 (×4): qty 30

## 2017-04-23 MED ORDER — POTASSIUM CHLORIDE 10 MEQ/100ML IV SOLN
10.0000 meq | INTRAVENOUS | Status: AC
  Administered 2017-04-23 (×4): 10 meq via INTRAVENOUS
  Filled 2017-04-23 (×4): qty 100

## 2017-04-23 NOTE — Progress Notes (Signed)
Nutrition Follow-up  DOCUMENTATION CODES:   Non-severe (moderate) malnutrition in context of chronic illness  INTERVENTION:  - Will d/c Ensure Enlive. - Will change Prostat order to 30 mL BID.  - Continue to encourage PO intakes of meals and supplement. - Consider trial of appetite stimulant.  - RD will continue to monitor for additional nutrition-related needs.  NUTRITION DIAGNOSIS:   Malnutrition related to cancer and cancer related treatments, acute illness (HCAP) as evidenced by percent weight loss, energy intake < or equal to 50% for > or equal to 5 days. -ongoing  GOAL:   Patient will meet greater than or equal to 90% of their needs -unmet  MONITOR:   PO intake, Supplement acceptance, Labs, Weight trends  ASSESSMENT:   66 y.o. 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 Admitted for HCAP with sepsis  4/24 Per chart review, pt ate 50% of breakfast and dinner 4/22 and 25% of breakfast and lunch yesterday. Per son, who is at bedside, for breakfast this AM pt had a bite of egg and a bite of bread and for lunch she ate 100% of a fruit cup. He states that yesterday and today she has only been eating bites of meals. Pt denies nausea with or without eating and states that abdominal discomfort she was previously experiencing has resolved over the last 2 days with use of Imodium. She reports she simply does not have an appetite. Son expresses concern about very poor intakes.  Ensure Enlive ordered but pt reports she refuses to drink this as she experiencing abdominal cramping and discomfort; son states that she was provided with this supplement PTA based on his and PCP recommendation. Pt has not tried Prostat as she is afraid of the same  symptoms. Talked with pt about the supplement and also the importance of adequate kcal and protein intake, especially in the hospital. Pt is willing to try Prostat and to begin thinking of foods she feels she could tolerate well and eat more of.   Medications reviewed; calcitonin injections every 12 hours for 4 doses starting 4/22 @ 2000, 20 mg IV Lasix PRN, PRN Imodium, 2 g IV Mg sulfate x1 run today, daily multivitamin with minerals, 40 mg oral Protonix/day. Labs reviewed; K: 2.5 mmol/L, Mg: 1.6 mg/dL, Ca: 11.3 mg/dL.    4/20 - Met with pt and family member in room today.  - Pt reports intermittent poor appetite and oral intake for several months PTA.  - Per chart, pt has lost 17lbs(9%) in 4 months; this is significant.  - Pt had recent hospital admission from 4/13-4/16 for HCAP but reports that she was eating better at time of discharge but then her appetite declined once she returned home.  - Pt currently eating <25% meals.  - Pt reports that supplements give her diarrhea.  - Pt is willing to try Prostat. RD will order snacks.  - Per MD note, pt in planning to start new chemo.   Nutrition-Focused physical exam completed. Findings are no fat depletion, no muscle depletion, and no edema.    Diet Order:  Diet Heart Room service appropriate? Yes; Fluid consistency: Thin  Skin:  Reviewed, no issues  Last BM:  4/24  Height:   Ht Readings from Last 1 Encounters:  04/19/17 '5\' 6"'  (1.676 m)    Weight:  Wt Readings from Last 1 Encounters:  04/19/17 172 lb 6.4 oz (78.2 kg)    Ideal Body Weight:  59 kg  BMI:  Body mass index is 27.83 kg/m.  Estimated Nutritional Needs:   Kcal:  1600-1900kcal/day   Protein:  94-109g/day   Fluid:  >1.6L/day   EDUCATION NEEDS:   No education needs identified at this time    Jarome Matin, MS, RD, LDN, CNSC Inpatient Clinical Dietitian Pager # [phone] After hours/weekend pager # [phone]

## 2017-04-23 NOTE — Progress Notes (Signed)
Subjective: The patient is seen and examined today. Her nephew was at the bedside. The patient is feeling a little bit better today but continues to have significant fatigue and weakness. She was found recently to have progressive multiple myeloma and she is expected to start new systemic chemotherapy unless in 2 weeks. She was admitted this weekend with increasing shortness of breath and weakness chest x-ray showed suspicious right lung pneumonia. She is currently on treatment with Levaquin 750 mg IV every 24 hours. She denied having any current fever or chills. She has no nausea or vomiting.  Objective: Vital signs in last 24 hours: Temp:  [98 F (36.7 C)-98.5 F (36.9 C)] 98 F (36.7 C) (04/24 0615) Pulse Rate:  [107-116] 107 (04/24 0615) Resp:  [18-22] 18 (04/24 0615) BP: (162-164)/(80-97) 163/88 (04/24 0615) SpO2:  [97 %-98 %] 97 % (04/24 0615)  Intake/Output from previous day: 04/23 0701 - 04/24 0700 In: 1170 [P.O.:920; IV Piggyback:250] Out: -  Intake/Output this shift: Total I/O In: -  Out: 175 [Urine:175]  General appearance: alert, cooperative, fatigued and no distress Resp: rales RML and RUL Cardio: regular rate and rhythm, S1, S2 normal, no murmur, click, rub or gallop GI: soft, non-tender; bowel sounds normal; no masses,  no organomegaly Extremities: extremities normal, atraumatic, no cyanosis or edema  Lab Results:   Recent Labs  04/22/17 0550 04/23/17 0624  WBC 14.4* 12.5*  HGB 11.9* 11.2*  HCT 34.6* 32.9*  PLT 163 157   BMET  Recent Labs  04/22/17 0550 04/23/17 0624  NA 142 144  K 3.2* 2.5*  CL 107 106  CO2 24 26  GLUCOSE 131* 104*  BUN 6 6  CREATININE 0.73 0.69  CALCIUM 11.8* 11.3*    Studies/Results: No results found.  Medications: I have reviewed the patient's current medications.  CODE STATUS: No CODE BLUE  Assessment/Plan: This is a very pleasant 66 years old African-American female with progressive multiple myeloma. She status post  several chemotherapy regimen in the past but unfortunately in the last few weeks her disease has progressed and she is expected to start new systemic chemotherapy with Carfilzomib, Pomalyst and Decadron soon once her condition improved. The patient is complaining of increasing fatigue and weakness She was admitted with right lung pneumonia and currently on treatment with Levaquin and feeling a little bit better. Her recent MRI of the lumbar spine showed no evidence for cord compression or nerve impingement but the patient has several compression fracture consistent with his history of multiple myeloma and low-grade treatment with the steroids. I recommended for her to continue her current treatment for pneumonia for now. I also recommended for the patient to continue with physical therapy once her condition is a stable. I will arrange for her follow-up appointment with me after discharge for discussion of her next chemotherapy versus palliative care. Thank you for taking good care of Mrs. Laurance Flatten, I will continue to follow up the patient with you and assist in her management an as-needed basis.   LOS: 4 days    Parlee Amescua K. 04/23/2017

## 2017-04-23 NOTE — Care Management Note (Signed)
Case Management Note  Patient Details  Name: AKANSHA WYCHE MRN: [id number] Date of Birth: 1951/12/06  Subjective/Objective:  [facility] w/[facility], & wants d/c back home-declines SNF -[facility] rep Kim aware of d/c plan, High Risk Initiative(HRI[facility] RN/PT/OT/aide/sw, dme-hospital bed-to be delivered prior patient d/c to [facility] rep Kim aware to make arrangements w/patient for delivery contact person.PTAR for ambulance transp needed @ d/c-PTAR forms will be in shadow chart tomorrow,& out of facility DNR(already signed).                  Action/Plan:d/c home w/HHC/dme/ambulance.   Expected Discharge Date:                  Expected Discharge Plan:  [place]  In-House Referral:  Clinical Social Work  Discharge planning Services  CM Consult  Post Acute Care Choice:  Home Health (Active w/[facility] HHPT/OT) Choice offered to:     DME Arranged:  Hospital bed DME Agency:  [place]:  RN, PT, OT, Nurse's Aide, Social Work, Refused SNF [employer] Agency:     Status of Service:  Completed, signed off  If discussed at [employer], dates discussed:    Additional Comments:  Dessa Phi, RN 04/23/2017, 12:03 PM

## 2017-04-23 NOTE — Telephone Encounter (Signed)
Several attempts have been made to reach patient to have them return for HFU.  Will continue to call or advise if a letter sent to the home address is in order.

## 2017-04-23 NOTE — Consult Note (Addendum)
   [facility] CM Inpatient Consult   04/23/2017  Darlene Howard 07/12/1951 [id number]    Alerted by [place] Management office of EMMI trigger red and request made for hospital liaison to follow up with patient at bedside for [place] Management services.   Spoke with inpatient RNCM prior to bedside visit. Went to speak with Darlene Howard at bedside to offer and explain [place] Management services. Darlene Howard pleasantly declined [facility] Care Management. She also requests that EMMI calls be discontinued. Provided [facility] Care Management brochure and contact information to call should she desire [place] Management services in the future.   Made inpatient RNCM aware that [place] Management services were declined. Will make [place] Management office aware of this as well.   Marthenia Rolling, MSN-Ed, RN,BSN [facility] Liaison [phone]

## 2017-04-23 NOTE — Care Management Important Message (Signed)
Important Message  Patient Details  Name: Darlene Howard MRN: [id number] Date of Birth: [birth year]   Medicare Important Message Given:  Yes    Kerin Salen 04/23/2017, 10:35 AMImportant Message  Patient Details  Name: Darlene Howard MRN: [id number] Date of Birth: December 30, 1951   Medicare Important Message Given:  Yes    Kerin Salen 04/23/2017, 10:35 AM

## 2017-04-23 NOTE — Progress Notes (Signed)
PROGRESS NOTE [facility]   Darlene Howard   ZOX:[id number] DOB: May 16, 1951  DOA: 04/18/2017 PCP: [name], MD   Brief Narrative:  Darlene Howard is a 66 y.o. 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  The patient was recently hospitalized from 04/13 to 4/16 due to HCAP. She was treated with total of 14 days of total antibiotics. She has been doing OK until yesterday when she started having fever with temperature 100.4. She also has chills, generalized weakness, dry cough. She has mild SOB, but no chest pain. She also has bilateral leg weakness, left-sided flank pain. She denies nausea, vomiting, diarrhea, abdominal pain. No symptoms of UTI. Patient states that she went to [facility] on Tuesday for second opinion about her multiple myeloma treatment. They are planning to do new chemotherapy for her.  Subjective: Patient doing much better today, report is better control. No acute events overnight.  Assessment & Plan: Sepsis 2/2 HCAP - sepsis physiology resolved, CXR consistent with worsening PNA  Patient on triple abx coverage given high risk for MDR Continues to improve clinically On Zosyn and Levaquin will continue for 1 more day then transition to oral antibiotics she will need total 14 days of antibiotic, Vanco d/ced give MRSA PCR negative  Follow up blood cultures NGTD  Leukocytosis continues to improve Out of bed as tolerated PT/OT  Patient continues to improve may be discharge in the morning  UTI - yeast  Treated with 1 dose of Diflucan  Monitor    Hypercalcemia of malignancy - Corrected Ca 12.8 Patient received Zometa recently  Treated with calcitonin for 2 days  Patient also got lasix  IVF had to be  d/ced due to fluid overload We'll give a trial of low-dose prednisone 20 mg daily, is usually take 2-5 day notice some changes in calcium levels.  Multiple myeloma contributing to back and rib pain  Pain control as needed - Fentanyl adjusted  Patient seen by Dr. [name], patient will continue with current management and will discuss cancer treatment as an outpatient. Patient declined palliative care consult  Anemia of chronic diseases - due to malignancy - stable S/p 3 units of PRBC's  Hgb stable  Check CBC in the morning  Hypokalemia - due to lasix  Continue to replete replete Check BMP in AM  Check Magnesium in AM   DVT prophylaxis: Lovenox  Code Status: FULL  Family Communication: Family at bedside  Disposition Plan: Unclear at this time   Consultants:   None  Procedures:   None   Antimicrobials: Anti-infectives    Start     Dose/Rate Route Frequency Ordered Stop   04/20/17 1800  fluconazole (DIFLUCAN) tablet 200 mg     200 mg Oral  Once 04/20/17 1759 04/20/17 1938   04/19/17 1600  vancomycin (VANCOCIN) IVPB 1000 mg/200 mL premix  Status:  Discontinued     1,000 mg 200 mL/hr over 60 Minutes Intravenous Every 12 hours 04/19/17 0322 04/19/17 1212   04/19/17 1600  vancomycin (VANCOCIN) 500 mg in sodium chloride 0.9 % 100 mL IVPB  Status:  Discontinued     500 mg 100 mL/hr over 60 Minutes Intravenous Every 12 hours 04/19/17 1212 04/22/17 1259   04/19/17 1000  levofloxacin (LEVAQUIN) IVPB 750 mg     750 mg 100  mL/hr over 90 Minutes Intravenous Every 24 hours 04/19/17 0831     04/19/17 0800  piperacillin-tazobactam (ZOSYN) IVPB 3.375 g     3.375 g 12.5 mL/hr over 240 Minutes Intravenous Every 8 hours 04/19/17 0322     04/19/17 0045  vancomycin (VANCOCIN) IVPB 1000 mg/200 mL premix     1,000 mg 200 mL/hr over 60 Minutes Intravenous  Once 04/19/17 0044 04/19/17 0302   04/19/17 0045  piperacillin-tazobactam (ZOSYN) IVPB 3.375 g     3.375 g 100 mL/hr over 30 Minutes  Intravenous  Once 04/19/17 0044 04/19/17 0208      Objective: Vitals:   04/22/17 1418 04/22/17 2140 04/23/17 0615 04/23/17 1429  BP: (!) 164/97 (!) 162/80 (!) 163/88 128/60  Pulse: (!) 116 (!) 110 (!) 107 (!) 102  Resp: (!) '22 18 18 17  ' Temp: 98.5 F (36.9 C) 98.4 F (36.9 C) 98 F (36.7 C) 98.6 F (37 C)  TempSrc: Oral Oral Oral Oral  SpO2: 98% 97% 97% 96%  Weight:      Height:        Intake/Output Summary (Last 24 hours) at 04/23/17 1900 Last data filed at 04/23/17 1649  Gross per 24 hour  Intake              990 ml  Output              175 ml  Net              815 ml   Filed Weights   04/19/17 0037 04/19/17 0345  Weight: 73 kg (161 lb) 78.2 kg (172 lb 6.4 oz)    Examination:  General exam: NAD Respiratory system: Good air entry, bilateral rales. This was an anterior auscultation given patient poor mobility Cardiovascular system: S1-S2 regular rate and rhythm Gastrointestinal system: Abdomen soft Central nervous system: Alert and oriented   Extremities: No lower extremity edema Skin: No lesions Psychiatry: Mood appropriate and more happy  Data Reviewed: I have personally reviewed following labs and imaging studies  CBC:  Recent Labs Lab 04/19/17 0105  04/20/17 0543 04/20/17 2057 04/21/17 1521 04/22/17 0550 04/23/17 0624  WBC 21.4*  --  15.8*  --  11.8* 14.4* 12.5*  NEUTROABS 17.5*  --  12.6*  --  9.5* 11.7* 10.0*  HGB 8.0*  < > 6.9* 7.1* 11.4* 11.9* 11.2*  HCT 25.3*  < > 21.6* 23.3* 33.6* 34.6* 32.9*  MCV 93.4  --  94.7  --  88.0 88.3 89.6  PLT 187  --  150  --  145* 163 157  < > = values in this interval not displayed. Basic Metabolic Panel:  Recent Labs Lab 04/20/17 0543 04/20/17 2057 04/21/17 1521 04/22/17 0550 04/23/17 0624  NA 142 140 145 142 144  K 3.5 2.6* 2.9* 3.2* 2.5*  CL 111 109 108 107 106  CO2 21* 21* '26 24 26  ' GLUCOSE 71 81 125* 131* 104*  BUN '10 7 6 6 6  ' CREATININE 0.68 0.68 0.77 0.73 0.69  CALCIUM 11.3* 11.5* 11.7* 11.8*  11.3*  MG  --  1.4*  --   --  1.6*   GFR: Estimated Creatinine Clearance: 72.1 mL/min (by C-G formula based on SCr of 0.69 mg/dL). Liver Function Tests:  Recent Labs Lab 04/19/17 0105 04/21/17 1521 04/22/17 0550 04/23/17 0624  AST 35  --   --  29  ALT 9*  --   --  9*  ALKPHOS 65  --   --  73  BILITOT 0.7  --   --  0.7  PROT 8.0  --   --  7.6  ALBUMIN 2.3* 2.0* 2.2* 2.1*    Recent Labs Lab 04/19/17 0117  INR 1.27    Recent Labs Lab 04/19/17 0115 04/19/17 0348 04/19/17 0533 04/21/17 1521  PROCALCITON  --   --  0.94 0.95  LATICACIDVEN 2.16* 1.84  --   --     Recent Results (from the past 240 hour(s))  C difficile quick scan w PCR reflex     Status: None   Collection Time: 04/15/17  9:05 AM  Result Value Ref Range Status   C Diff antigen NEGATIVE NEGATIVE Final   C Diff toxin NEGATIVE NEGATIVE Final   C Diff interpretation No C. difficile detected.  Final  Culture, blood (Routine x 2)     Status: None (Preliminary result)   Collection Time: 04/19/17  1:05 AM  Result Value Ref Range Status   Specimen Description BLOOD RIGHT ANTECUBITAL  Final   Special Requests   Final    BOTTLES DRAWN AEROBIC AND ANAEROBIC Blood Culture results may not be optimal due to an inadequate volume of blood received in culture bottles   Culture   Final    NO GROWTH 4 DAYS Performed at [facility] [address] [place], [place] [address]    Report Status PENDING  Incomplete  Culture, blood (Routine x 2)     Status: None (Preliminary result)   Collection Time: 04/19/17  1:05 AM  Result Value Ref Range Status   Specimen Description BLOOD LEFT HAND  Final   Special Requests   Final    BOTTLES DRAWN AEROBIC AND ANAEROBIC Blood Culture adequate volume   Culture   Final    NO GROWTH 4 DAYS Performed at [facility], [place] [address], [place], [place] [address]    Report Status PENDING  Incomplete  Urine culture     Status: Abnormal   Collection Time: 04/19/17  2:58 AM    Result Value Ref Range Status   Specimen Description URINE, CLEAN CATCH  Final   Special Requests NONE  Final   Culture 60,000 COLONIES/mL YEAST (A)  Final   Report Status 04/20/2017 FINAL  Final  MRSA PCR Screening     Status: None   Collection Time: 04/22/17  8:53 AM  Result Value Ref Range Status   MRSA by PCR NEGATIVE NEGATIVE Final    Comment:        The GeneXpert MRSA Assay (FDA approved for NASAL specimens only), is one component of a comprehensive MRSA colonization surveillance program. It is not intended to diagnose MRSA infection nor to guide or monitor treatment for MRSA infections.     Radiology Studies: No results found.  Scheduled Meds: . aspirin EC  81 mg Oral Daily  . dextromethorphan-guaiFENesin  1 tablet Oral BID  . dorzolamide-timolol  1 drop Left Eye BID  . enoxaparin (LOVENOX) injection  40 mg Subcutaneous Q24H  . feeding supplement (PRO-STAT SUGAR FREE 64)  30 mL Oral BID  . gabapentin  100 mg Oral Daily  . latanoprost  1 drop Both Eyes QHS  . loratadine  10 mg Oral Daily  . mouth rinse  15 mL Mouth Rinse BID  . mirtazapine  30 mg Oral QHS  . multivitamin with minerals  1 tablet Oral Daily  . pantoprazole  40 mg Oral Daily   Continuous Infusions: . sodium chloride    .  levofloxacin (LEVAQUIN) IV Stopped (04/23/17 1434)  . piperacillin-tazobactam (ZOSYN)  IV 3.375 g (04/23/17 1648)  . 0.9 % sodium chloride with kcl Stopped (04/22/17 0443)     LOS: 4 days    Chipper Oman, MD Pager: Text Page via [email or web address]  [phone]  If 7PM-7AM, please contact night-coverage [email or web address] Password [email or web address] 04/23/2017, 7:00 PM

## 2017-04-23 NOTE — Evaluation (Signed)
Physical Therapy Evaluation Patient Details Name: Darlene Howard MRN: [id number] DOB: 1951/11/29 Today's Date: 04/23/2017   History of Present Illness  Darlene Howard is a 66 y.o. 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  The patient was recently hospitalized from 04/13 to 4/16 due to HCAP.   MRI of spine show L1, L2  pathologic compression fxs, significant  extraosseus tumor invasion.   Clinical Impression  The patient reports feeling much better today. Assisted with 2 assist for bed mobility and sitting on the bed edge. The patient's strength is assessed to be  Too weak to attempt standing, sliding boar to chair would be difficult. The patient will require a mechanical lift for OOB activity. The family reports that there is  One at the patient's home. Pt admitted with above diagnosis. Pt currently with functional limitations due to the deficits listed below (see PT Problem List).  Pt will benefit from skilled PT to increase their independence and safety with mobility to allow discharge to the venue listed below.       Follow Up Recommendations  HHPT    Equipment Recommendations   (sliding board, hospital bed)    Recommendations for Other Services       Precautions / Restrictions Precautions Precautions: Fall Precaution Comments: significant leg weakness      Mobility  Bed Mobility Overal bed mobility: Needs Assistance Bed Mobility: Rolling Rolling: Mod assist Sidelying to sit: Total assist   Sit to supine: Total assist;+2 for safety/equipment;+2 for physical assistance   General bed mobility comments: pt needed significant A with mobility.  Pt only able to sit EOB briefly before needing to return to supine  Transfers Overall  transfer level: Independent               General transfer comment: unable  Ambulation/Gait                Stairs            Wheelchair Mobility    Modified Rankin (Stroke Patients Only)       Balance Overall balance assessment: Needs assistance Sitting-balance support: Bilateral upper extremity supported Sitting balance-Leahy Scale: Poor Sitting balance - Comments: tendency to go back                                     Pertinent Vitals/Pain Pain Score: 4  Pain Location: back Pain Descriptors / Indicators: Discomfort Pain Intervention(s): Monitored during session;Premedicated before session;Repositioned    Home Living Family/patient expects to be discharged to:: Private residence Living Arrangements: Other relatives;Non-relatives/Friends Available Help at Discharge: Available 24 hours/day;Family Type of Home: House Home Access: Ramped entrance     Home Layout: Multi-level;Able to live on main level with bedroom/bathroom Home Equipment: Kasandra Knudsen - single point;Walker - 2 wheels;Wheelchair - manual Additional Comments: disabled sister   has a [employer] fast streamer    Prior Function Level of Independence: Needs assistance   Gait / Transfers Assistance Needed: now dependent transfers, needs lift           Hand Dominance        Extremity/Trunk Assessment   Upper Extremity Assessment Upper Extremity Assessment: Defer to OT evaluation    Lower Extremity Assessment Lower Extremity Assessment: RLE  deficits/detail;LLE deficits/detail RLE Deficits / Details: grossly 2+ hip flexion and extens, knee extension LLE Deficits / Details: slightly better than right with 3/5 through out       Communication      Cognition Arousal/Alertness: Awake/alert Behavior During Therapy: WFL for tasks assessed/performed Overall Cognitive Status: Within Functional Limits for tasks assessed                                        General  Comments      Exercises General Exercises - Upper Extremity Shoulder Flexion: AAROM;Both;5 reps Elbow Flexion: AROM;Both;5 reps Elbow Extension: AROM;Both;5 reps Wrist Flexion: AROM;Both;5 reps Wrist Extension: AROM;Both;5 reps Digit Composite Flexion: AROM;Both;5 reps Composite Extension: AROM;5 reps;Both   Assessment/Plan    PT Assessment Patient needs continued PT services  PT Problem List Decreased strength;Decreased activity tolerance;Decreased balance;Decreased mobility;Decreased knowledge of precautions;Decreased safety awareness;Pain       PT Treatment Interventions Functional mobility training;DME instruction;Therapeutic activities;Patient/family education    PT Goals (Current goals can be found in the Care Plan section)  Acute Rehab PT Goals Patient Stated Goal: go home with family PT Goal Formulation: With patient/family Time For Goal Achievement: 05/07/17 Potential to Achieve Goals: Fair    Frequency Min 3X/week   Barriers to discharge        Co-evaluation               End of Session   Activity Tolerance: Patient tolerated treatment well Patient left: in bed;with call bell/phone within reach;with bed alarm set;with family/visitor present Nurse Communication: Need for lift equipment;Mobility status PT Visit Diagnosis: Muscle weakness (generalized) (M62.81);Other symptoms and signs involving the nervous system (R29.898)    Time: 3212-2482 PT Time Calculation (min) (ACUTE ONLY): 36 min   Charges:   PT Evaluation $PT Eval Moderate Complexity: 1 Procedure     PT G CodesTresa Endo PT [phone]   Claretha Cooper 04/23/2017, 1:27 PM

## 2017-04-23 NOTE — Progress Notes (Signed)
Occupational Therapy Treatment Patient Details Name: Darlene Howard MRN: [id number] DOB: 12/15/1951 Today's Date: 04/23/2017    History of present illness Darlene Howard is a 66 y.o. 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  The patient was recently hospitalized from 04/13 to 4/16 due to HCAP.      OT comments  Pt wants to go home.  Educated on need for bed pan in hospital bed. Not not able to get to [facility] at this time. Pt and family verbalized understanding.   Follow Up Recommendations  Home health OT;Supervision/Assistance - 24 hour    Equipment Recommendations  Hospital bed    Recommendations for Other Services      Precautions / Restrictions Precautions Precautions: Fall       Mobility Bed Mobility   Bed Mobility: Sit to Supine       Sit to supine: Total assist;+2 for safety/equipment;+2 for physical assistance      Transfers                 General transfer comment: unable    Balance Overall balance assessment: Needs assistance Sitting-balance support: Bilateral upper extremity supported Sitting balance-Leahy Scale: Poor                                     ADL either performed or assessed with clinical judgement   ADL Overall ADL's : Needs assistance/impaired Eating/Feeding: Moderate assistance;Bed level Eating/Feeding Details (indicate cue type and reason): HOB raised- arms supported on pillows Grooming: Minimal assistance;Sitting;Wash/dry face Grooming Details (indicate cue type and reason): HOB raised- washing face- arms supported                               General ADL Comments: educated pt and family on positioning for self feeding - putting plate on table rather than  on plate stand. Pt with increased I with self feeding in this position               Cognition Arousal/Alertness: Awake/alert Behavior During Therapy: WFL for tasks assessed/performed Overall Cognitive Status: Within Functional Limits for tasks assessed                                          Exercises General Exercises - Upper Extremity Shoulder Flexion: AAROM;Both;5 reps Elbow Flexion: AROM;Both;5 reps Elbow Extension: AROM;Both;5 reps Wrist Flexion: AROM;Both;5 reps Wrist Extension: AROM;Both;5 reps Digit Composite Flexion: AROM;Both;5 reps Composite Extension: AROM;5 reps;Both   Shoulder Instructions  keep BUE elevated to A with edema in hands      General Comments      Pertinent Vitals/ Pain       Pain Score: 4  Pain Location: L forearm with ROM- reports sx on this arm by Dr Caralyn Guile Pain Descriptors / Indicators: Discomfort Pain Intervention(s): Monitored during session;Repositioned;Limited activity within patient's tolerance      Progress Toward Goals  OT Goals(current goals can now be found in the care plan section)  Progress towards OT goals: Progressing toward goals     Plan Discharge plan needs to be updated  End of Session        Activity Tolerance Patient tolerated treatment well   Patient Left in bed;with call bell/phone within reach;with bed alarm set;with family/visitor present   Nurse Communication Mobility status;Need for lift equipment        Time: 1225-1241 OT Time Calculation (min): 16 min  Charges: OT Treatments $Self Care/Home Management : 8-22 mins  Kari Baars, [place]   Betsy Pries 04/23/2017, 1:07 PM

## 2017-04-23 NOTE — Progress Notes (Signed)
CRITICAL VALUE ALERT  Critical value received:  K+ 2.5  Date of notification:  04/23/2017   Time of notification:  8:07 AM  Critical value read back:Yes.    Nurse who received alert:  Kaylyn Layer   MD notified (1st page):  Dr. Quincy Simmonds  Time of first page:  8:07 AM   MD notified (2nd page):  Time of second page:  Responding MD:  Dr. Quincy Simmonds  Time MD responded:  8:09am  MD ordered IV runs of K+ and IV Magnesium. Will administer per orders.

## 2017-04-24 ENCOUNTER — Telehealth: Payer: Self-pay | Admitting: Medical Oncology

## 2017-04-24 ENCOUNTER — Encounter: Payer: Self-pay | Admitting: Medical Oncology

## 2017-04-24 LAB — COMPREHENSIVE METABOLIC PANEL
ALK PHOS: 69 U/L (ref 38–126)
ALT: 9 U/L — AB (ref 14–54)
AST: 26 U/L (ref 15–41)
Albumin: 2.1 g/dL — ABNORMAL LOW (ref 3.5–5.0)
Anion gap: 11 (ref 5–15)
BILIRUBIN TOTAL: 0.6 mg/dL (ref 0.3–1.2)
BUN: 9 mg/dL (ref 6–20)
CALCIUM: 11.3 mg/dL — AB (ref 8.9–10.3)
CHLORIDE: 103 mmol/L (ref 101–111)
CO2: 27 mmol/L (ref 22–32)
CREATININE: 0.66 mg/dL (ref 0.44–1.00)
Glucose, Bld: 92 mg/dL (ref 65–99)
Potassium: 2.3 mmol/L — CL (ref 3.5–5.1)
Sodium: 141 mmol/L (ref 135–145)
TOTAL PROTEIN: 7.4 g/dL (ref 6.5–8.1)

## 2017-04-24 LAB — CULTURE, BLOOD (ROUTINE X 2)
Culture: NO GROWTH
Culture: NO GROWTH
Special Requests: ADEQUATE

## 2017-04-24 LAB — PROCALCITONIN: PROCALCITONIN: 2.92 ng/mL

## 2017-04-24 LAB — MAGNESIUM: MAGNESIUM: 1.8 mg/dL (ref 1.7–2.4)

## 2017-04-24 MED ORDER — OXYCODONE HCL ER 15 MG PO T12A
15.0000 mg | EXTENDED_RELEASE_TABLET | Freq: Two times a day (BID) | ORAL | Status: DC
  Administered 2017-04-24 – 2017-04-25 (×3): 15 mg via ORAL
  Filled 2017-04-24 (×3): qty 1

## 2017-04-24 MED ORDER — ZOLEDRONIC ACID 4 MG/5ML IV CONC
4.0000 mg | Freq: Once | INTRAVENOUS | Status: AC
  Administered 2017-04-24: 4 mg via INTRAVENOUS
  Filled 2017-04-24: qty 5

## 2017-04-24 MED ORDER — MAGNESIUM SULFATE IN D5W 1-5 GM/100ML-% IV SOLN
1.0000 g | Freq: Once | INTRAVENOUS | Status: AC
  Administered 2017-04-24: 1 g via INTRAVENOUS
  Filled 2017-04-24: qty 100

## 2017-04-24 MED ORDER — POTASSIUM CHLORIDE CRYS ER 20 MEQ PO TBCR
40.0000 meq | EXTENDED_RELEASE_TABLET | ORAL | Status: AC
  Administered 2017-04-24 (×2): 40 meq via ORAL
  Filled 2017-04-24 (×3): qty 2

## 2017-04-24 MED ORDER — POTASSIUM CHLORIDE 10 MEQ/100ML IV SOLN
10.0000 meq | INTRAVENOUS | Status: AC
  Administered 2017-04-24 (×3): 10 meq via INTRAVENOUS
  Filled 2017-04-24 (×3): qty 100

## 2017-04-24 MED ORDER — SACCHAROMYCES BOULARDII 250 MG PO CAPS
250.0000 mg | ORAL_CAPSULE | Freq: Two times a day (BID) | ORAL | Status: DC
  Administered 2017-04-24 – 2017-04-25 (×3): 250 mg via ORAL
  Filled 2017-04-24 (×3): qty 1

## 2017-04-24 MED ORDER — SODIUM CHLORIDE 0.9 % IV SOLN: 30.0000 meq | Freq: Once | INTRAVENOUS | Status: DC

## 2017-04-24 NOTE — Progress Notes (Signed)
CRITICAL VALUE ALERT  Critical value received:  K+ 2.3  Date of notification:  04/24/17  Time of notification:  0638  Critical value read back:Yes.    Nurse who received alert:  Leona Carry  MD notified (1st page):  Baltazar Najjar, NP  Time of first page:  [phone]

## 2017-04-24 NOTE — Telephone Encounter (Signed)
Brother has questions about pomalyst and [facility] copay assistance card that pt received. They do not know where med will be coming from and if there is anything they need to do.  [facility] ID [id number] pharmacy ID [id number]. PCN [id number] [employer] [facility] phone number [phone] Note sent to Johny Drilling for assistance.

## 2017-04-24 NOTE — Progress Notes (Signed)
PROGRESS NOTE [facility]   Darlene Howard   TTS:[id number] DOB: 11/13/1951  DOA: 04/18/2017 PCP: [name], MD   Brief Narrative:  Darlene Howard is a 66 y.o. 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  The patient was recently hospitalized from 04/13 to 4/16 due to HCAP. She was treated with total of 14 days of total antibiotics. She came to ED c/o having fever with temperature 100.4. She also had chills, generalized weakness, dry cough. She had mild SOB, but no chest pain. She also has bilateral leg weakness, left-sided flank pain. Patient states that she went to [facility] for second opinion about her multiple myeloma treatment. They are planning to do new chemotherapy for her. Patient was admitted for recurrent pneumonia and malignant hypercalcemia.   Subjective: Patient continue to improves, no acute events overnight. No acute events overnight. Remains afebrile, tolerating diet well.   Assessment & Plan: Sepsis 2/2 HCAP - sepsis physiology resolved, CXR consistent with worsening PNA  Initially treated with Zosyn, Vanc and Levaquin given high risk for MDR Continues to improve clinically Vanco d/ced give MRSA PCR negative, Received 5 days of Zosyn d/ced on 04/24/17 Continue Levaquin, she will need total of 14 days of abx  Follow up blood cultures NGTD  Leukocytosis improved  Out of bed as tolerated PT/OT  If lab work up improved and she is clinically improved can d/c in AM   UTI - yeast  Treated with 1 dose of Diflucan  Monitor    Hypercalcemia of malignancy - Corrected Ca 12.8 Patient received Zometa recently, another dose given 04/24/17 Treated with calcitonin for 2 days  Patient also got lasix  IVF had to  be d/ced due to fluid overload Continue trial of low-dose prednisone 20 mg daily, is usually take 2-5 day notice some changes in calcium levels.  Multiple myeloma contributing to back and rib pain  Pain control as needed - Can be d/c on Percocet PRN and Oxycodone XR schedule, this pain regimen is working well for her.  Patient seen by Dr. [name], patient will continue with current management and will discuss cancer treatment as an outpatient. Patient declined palliative care consult  Anemia of chronic diseases - due to malignancy - stable S/p 3 units of PRBC's  Hgb stable  No further workup at this point   Hypokalemia - due to lasix, now patient report diarrhea  Replete  Will start, PO supplementation as weel  Check BMP and Mag in AM   DVT prophylaxis: Lovenox  Code Status: FULL  Family Communication: Family at bedside  Disposition Plan: If lab stable d/c in AM   Consultants:   None  Procedures:   None   Antimicrobials: Anti-infectives    Start     Dose/Rate Route Frequency Ordered Stop   04/20/17 1800  fluconazole (DIFLUCAN) tablet 200 mg     200 mg Oral  Once 04/20/17 1759 04/20/17 1938   04/19/17 1600  vancomycin (VANCOCIN) IVPB 1000 mg/200 mL premix  Status:  Discontinued     1,000 mg 200 mL/hr over 60 Minutes Intravenous Every 12 hours 04/19/17 0322 04/19/17 1212   04/19/17 1600  vancomycin (VANCOCIN) 500 mg in sodium chloride 0.9 % 100 mL IVPB  Status:  Discontinued     500 mg 100 mL/hr over 60  Minutes Intravenous Every 12 hours 04/19/17 1212 04/22/17 1259   04/19/17 1000  levofloxacin (LEVAQUIN) IVPB 750 mg     750 mg 100 mL/hr over 90 Minutes Intravenous Every 24 hours 04/19/17 0831     04/19/17 0800  piperacillin-tazobactam (ZOSYN) IVPB 3.375 g  Status:  Discontinued     3.375 g 12.5 mL/hr over 240 Minutes Intravenous Every 8 hours 04/19/17 0322 04/24/17 0955   04/19/17 0045  vancomycin (VANCOCIN) IVPB 1000 mg/200 mL premix     1,000 mg 200 mL/hr over 60  Minutes Intravenous  Once 04/19/17 0044 04/19/17 0302   04/19/17 0045  piperacillin-tazobactam (ZOSYN) IVPB 3.375 g     3.375 g 100 mL/hr over 30 Minutes Intravenous  Once 04/19/17 0044 04/19/17 0208      Objective: Vitals:   04/23/17 1429 04/23/17 2116 04/24/17 0535 04/24/17 1333  BP: 128/60 (!) 169/89 138/64 (!) 143/76  Pulse: (!) 102 (!) 110 (!) 101 (!) 103  Resp: '17 18 18 18  ' Temp: 98.6 F (37 C) 98.3 F (36.8 C) 98.5 F (36.9 C) 98.1 F (36.7 C)  TempSrc: Oral Oral Oral Oral  SpO2: 96% 96% 96% 96%  Weight:      Height:        Intake/Output Summary (Last 24 hours) at 04/24/17 1817 Last data filed at 04/24/17 1300  Gross per 24 hour  Intake              960 ml  Output              300 ml  Net              660 ml   Filed Weights   04/19/17 0037 04/19/17 0345  Weight: 73 kg (161 lb) 78.2 kg (172 lb 6.4 oz)    Examination:  General exam: NAD  Respiratory system: Good air entry, mild crackles at the R base but significantly improved  Cardiovascular system: S1S2 RRR Gastrointestinal system: Abd soft NTND Central nervous system: AAOx3 Extremities: NO LE edema  Skin: No rash  Psychiatry: Mood appropriate   Data Reviewed: I have personally reviewed following labs and imaging studies  CBC:  Recent Labs Lab 04/19/17 0105  04/20/17 0543 04/20/17 2057 04/21/17 1521 04/22/17 0550 04/23/17 0624  WBC 21.4*  --  15.8*  --  11.8* 14.4* 12.5*  NEUTROABS 17.5*  --  12.6*  --  9.5* 11.7* 10.0*  HGB 8.0*  < > 6.9* 7.1* 11.4* 11.9* 11.2*  HCT 25.3*  < > 21.6* 23.3* 33.6* 34.6* 32.9*  MCV 93.4  --  94.7  --  88.0 88.3 89.6  PLT 187  --  150  --  145* 163 157  < > = values in this interval not displayed. Basic Metabolic Panel:  Recent Labs Lab 04/20/17 2057 04/21/17 1521 04/22/17 0550 04/23/17 0624 04/24/17 0544  NA 140 145 142 144 141  K 2.6* 2.9* 3.2* 2.5* 2.3*  CL 109 108 107 106 103  CO2 21* '26 24 26 27  ' GLUCOSE 81 125* 131* 104* 92  BUN '7 6 6 6 9    ' CREATININE 0.68 0.77 0.73 0.69 0.66  CALCIUM 11.5* 11.7* 11.8* 11.3* 11.3*  MG 1.4*  --   --  1.6* 1.8   GFR: Estimated Creatinine Clearance: 72.1 mL/min (by C-G formula based on SCr of 0.66 mg/dL). Liver Function Tests:  Recent Labs Lab 04/19/17 0105 04/21/17 1521 04/22/17 0550 04/23/17 0624 04/24/17 0544  AST 35  --   --  29 26  ALT 9*  --   --  9* 9*  ALKPHOS 65  --   --  73 69  BILITOT 0.7  --   --  0.7 0.6  PROT 8.0  --   --  7.6 7.4  ALBUMIN 2.3* 2.0* 2.2* 2.1* 2.1*    Recent Labs Lab 04/19/17 0117  INR 1.27    Recent Labs Lab 04/19/17 0115 04/19/17 0348 04/19/17 0533 04/21/17 1521 04/24/17 0542  PROCALCITON  --   --  0.94 0.95 2.92  LATICACIDVEN 2.16* 1.84  --   --   --     Recent Results (from the past 240 hour(s))  C difficile quick scan w PCR reflex     Status: None   Collection Time: 04/15/17  9:05 AM  Result Value Ref Range Status   C Diff antigen NEGATIVE NEGATIVE Final   C Diff toxin NEGATIVE NEGATIVE Final   C Diff interpretation No C. difficile detected.  Final  Culture, blood (Routine x 2)     Status: None   Collection Time: 04/19/17  1:05 AM  Result Value Ref Range Status   Specimen Description BLOOD RIGHT ANTECUBITAL  Final   Special Requests   Final    BOTTLES DRAWN AEROBIC AND ANAEROBIC Blood Culture results may not be optimal due to an inadequate volume of blood received in culture bottles   Culture   Final    NO GROWTH 5 DAYS Performed at [facility], [place] [address], [place], [place] [address]    Report Status 04/24/2017 FINAL  Final  Culture, blood (Routine x 2)     Status: None   Collection Time: 04/19/17  1:05 AM  Result Value Ref Range Status   Specimen Description BLOOD LEFT HAND  Final   Special Requests   Final    BOTTLES DRAWN AEROBIC AND ANAEROBIC Blood Culture adequate volume   Culture   Final    NO GROWTH 5 DAYS Performed at [facility], [place] [address] [place], [place] [address]    Report Status  04/24/2017 FINAL  Final  Urine culture     Status: Abnormal   Collection Time: 04/19/17  2:58 AM  Result Value Ref Range Status   Specimen Description URINE, CLEAN CATCH  Final   Special Requests NONE  Final   Culture 60,000 COLONIES/mL YEAST (A)  Final   Report Status 04/20/2017 FINAL  Final  MRSA PCR Screening     Status: None   Collection Time: 04/22/17  8:53 AM  Result Value Ref Range Status   MRSA by PCR NEGATIVE NEGATIVE Final    Comment:        The GeneXpert MRSA Assay (FDA approved for NASAL specimens only), is one component of a comprehensive MRSA colonization surveillance program. It is not intended to diagnose MRSA infection nor to guide or monitor treatment for MRSA infections.     Radiology Studies: No results found.  Scheduled Meds: . aspirin EC  81 mg Oral Daily  . dextromethorphan-guaiFENesin  1 tablet Oral BID  . dorzolamide-timolol  1 drop Left Eye BID  . enoxaparin (LOVENOX) injection  40 mg Subcutaneous Q24H  . feeding supplement (PRO-STAT SUGAR FREE 64)  30 mL Oral BID  . gabapentin  100 mg Oral Daily  . latanoprost  1 drop Both Eyes QHS  . loratadine  10 mg Oral Daily  . mouth rinse  15 mL Mouth Rinse BID  . mirtazapine  30 mg Oral  QHS  . multivitamin with minerals  1 tablet Oral Daily  . oxyCODONE  15 mg Oral Q12H  . pantoprazole  40 mg Oral Daily  . predniSONE  20 mg Oral Q breakfast  . saccharomyces boulardii  250 mg Oral BID   Continuous Infusions: . levofloxacin (LEVAQUIN) IV Stopped (04/24/17 1105)     LOS: 5 days    Chipper Oman, MD Pager: Text Page via [email or web address]  [phone]  If 7PM-7AM, please contact night-coverage [email or web address] Password [facility] 04/24/2017, 6:17 PM

## 2017-04-24 NOTE — Telephone Encounter (Signed)
This pt is hospitalized very ill , no need to call for appointments, family will schedule if an when able

## 2017-04-24 NOTE — Progress Notes (Signed)
Occupational Therapy Treatment Patient Details Name: Darlene Howard MRN: [id number] DOB: [birth year] Today's Date: 04/24/2017    History of present illness Darlene Howard is a 66 y.o. 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  The patient was recently hospitalized from 04/13 to 4/16 due to HCAP.   MRI of spine show L1, L2  pathologic compression fxs, significant  extraosseus tumor invasion.    OT comments  Pt fatigued quickly with bed mobility activity .    Follow Up Recommendations  Home health OT;Supervision/Assistance - 24 hour    Equipment Recommendations  Hospital bed    Recommendations for Other Services      Precautions / Restrictions Precautions Precautions: Fall Precaution Comments: significant leg weakness       Mobility Bed Mobility Overal bed mobility: Needs Assistance Bed Mobility: Rolling Rolling: Mod assist;Min assist (pt min- mod A with VC for technique)         General bed mobility comments: Pt also worked on bridging in bed with OT for bedpan placement and repositioning.    Transfers                 General transfer comment: unable        ADL either performed or assessed with clinical judgement   ADL Overall ADL's : Needs assistance/impaired                                       General ADL Comments: OT session focused on bed mobility with rolling for bed pan placement, bathing and dressing.  Most of pts ADL;s will likely be performed in the bed at this time due to trunk and leg weakness.               Cognition Arousal/Alertness: Awake/alert Behavior During Therapy: WFL for tasks assessed/performed Overall Cognitive Status: Within Functional Limits for tasks assessed                                           Exercises General Exercises - Upper Extremity Shoulder Flexion: AAROM;Both;5 reps Elbow Flexion: AROM;Both;5 reps Elbow Extension: AROM;Both;5 reps Wrist Flexion: AROM;Both;5 reps Wrist Extension: AROM;Both;5 reps Digit Composite Flexion: AROM;Both;5 reps Composite Extension: AROM;5 reps;Both   Shoulder Instructions       General Comments      Pertinent Vitals/ Pain       Pain Score: 4  Pain Descriptors / Indicators: Discomfort Pain Intervention(s): Monitored during session;Limited activity within patient's tolerance;Repositioned         Frequency  Min 2X/week        Progress Toward Goals  OT Goals(current goals can now be found in the care plan section)  Progress towards OT goals: Progressing toward goals     Plan Discharge plan remains appropriate          Activity Tolerance Patient tolerated treatment well   Patient Left in bed;with call bell/phone within reach;with bed alarm set;with family/visitor present   Nurse Communication Mobility status;Need for lift equipment        Time: 1791-5056 OT Time Calculation (min): 24 min  Charges: OT General Charges $  OT Visit: 1 Procedure OT Treatments $Self Care/Home Management : 8-22 mins $Therapeutic Exercise: 8-22 mins  [place], [place]   Darlene Howard 04/24/2017, 3:05 PM

## 2017-04-24 NOTE — Progress Notes (Signed)
Pharmacy Antibiotic Note  Darlene Howard is a 66 y.o. female with hx of multiple myeloma c/o weakness, chills and fever admitted on 04/18/2017 with pneumonia.  Pharmacy has been consulted for Levaquin dosing.  Zosyn d/c'd today.  Plan: Continue Levaquin 750 mg IV q24h.  Today is day #6 of antibiotics.  Could consider discontinuing antibiotics after tomorrow's dose for 7 day treatment of HCAP.  Height: '5\' 6"'  (167.6 cm) Weight: 172 lb 6.4 oz (78.2 kg) IBW/kg (Calculated) : 59.3  Temp (24hrs), Avg:98.5 F (36.9 C), Min:98.3 F (36.8 C), Max:98.6 F (37 C)   Recent Labs Lab 04/19/17 0105 04/19/17 0115 04/19/17 0348 04/20/17 0543 04/20/17 2057 04/21/17 1521 04/22/17 0550 04/23/17 0624 04/24/17 0544  WBC 21.4*  --   --  15.8*  --  11.8* 14.4* 12.5*  --   CREATININE 0.73 0.80  --  0.68 0.68 0.77 0.73 0.69 0.66  LATICACIDVEN  --  2.16* 1.84  --   --   --   --   --   --   VANCOTROUGH  --   --   --   --   --  13*  --   --   --     Estimated Creatinine Clearance: 72.1 mL/min (by C-G formula based on SCr of 0.66 mg/dL).    Allergies  Allergen Reactions  . Codeine Nausea And Vomiting  . Tramadol Nausea And Vomiting    Antimicrobials this admission:  4/20 Vanc >> 4/23 4/20 Zosyn >> 4/25 4/20 Levaquin >> 4/22 fluconazole x 1  Dose adjustments this admission:  4/22 VT = 13 mcg/ml on 500 mg IV q12h - no change  Microbiology results:  4/20 BCx: ngtd 4/20 UCx: 60k yeast 4/20 Sputum:   4/20 influenza neg 4/20 strep/leg ur ag: neg/IP MRSA PCR: neg  Thank you for allowing pharmacy to be a part of this patient's care.  Hershal Coria 04/24/2017 11:44 AM

## 2017-04-25 DIAGNOSIS — E440 Moderate protein-calorie malnutrition: Secondary | ICD-10-CM

## 2017-04-25 LAB — MAGNESIUM: Magnesium: 1.8 mg/dL (ref 1.7–2.4)

## 2017-04-25 LAB — BASIC METABOLIC PANEL
Anion gap: 12 (ref 5–15)
BUN: 11 mg/dL (ref 6–20)
CALCIUM: 12 mg/dL — AB (ref 8.9–10.3)
CO2: 24 mmol/L (ref 22–32)
CREATININE: 0.68 mg/dL (ref 0.44–1.00)
Chloride: 106 mmol/L (ref 101–111)
GFR calc Af Amer: >60 mL/min (ref >60)
Glucose, Bld: 80 mg/dL (ref 65–99)
Potassium: 3 mmol/L — ABNORMAL LOW (ref 3.5–5.1)
Sodium: 142 mmol/L (ref 135–145)

## 2017-04-25 MED ORDER — POTASSIUM CHLORIDE CRYS ER 20 MEQ PO TBCR: 20.0000 meq | EXTENDED_RELEASE_TABLET | Freq: Two times a day (BID) | ORAL | 1 refills | Status: AC

## 2017-04-25 MED ORDER — LEVOFLOXACIN 500 MG PO TABS: 500.0000 mg | ORAL_TABLET | Freq: Every day | ORAL | 0 refills | Status: DC

## 2017-04-25 MED ORDER — OXYCODONE HCL ER 15 MG PO T12A: 15.0000 mg | EXTENDED_RELEASE_TABLET | Freq: Two times a day (BID) | ORAL | 0 refills | Status: AC

## 2017-04-25 MED ORDER — POTASSIUM CHLORIDE CRYS ER 20 MEQ PO TBCR
40.0000 meq | EXTENDED_RELEASE_TABLET | ORAL | Status: AC
  Administered 2017-04-25 (×2): 40 meq via ORAL
  Filled 2017-04-25 (×2): qty 2

## 2017-04-25 MED ORDER — PREDNISONE 20 MG PO TABS: 20.0000 mg | ORAL_TABLET | Freq: Every day | ORAL | 0 refills | Status: AC

## 2017-04-25 MED ORDER — PREDNISONE 20 MG PO TABS: 20.0000 mg | ORAL_TABLET | Freq: Every day | ORAL | 0 refills | Status: DC

## 2017-04-25 NOTE — Care Management Note (Signed)
Case Management Note  Patient Details  Name: KAMALEI ROEDER MRN: [id number] Date of Birth: 1951-04-05  Subjective/Objective: [facility] rep Kim aware of d/c & HHC orders-HHRN-labs to be sent to Dr. [name] pcp-Dr. Joycelyn Schmid [place]-attending will put orders in. Hospital bed has already been delivered to home.Patient is [facility] Risk [facility] is aware. [facility] ambulance transp @ d/c already called & nurse to call [facility] if not d/c by 3:30p-DNR form, [facility] forms in shadow chart.                    Action/Plan:d/c home w/HHC/[facility]   Expected Discharge Date:                  Expected Discharge Plan:  [place]  In-House Referral:  Clinical Social Work  Discharge planning Services  CM Consult  Post Acute Care Choice:  Home Health (Active w/[facility] HHPT/OT) Choice offered to:     DME Arranged:  Hospital bed DME Agency:  [place]:  RN, PT, OT, Nurse's Aide, Social Work, Refused SNF [place] Agency:  [place]  Status of Service:  Completed, signed off  If discussed at [employer], dates discussed:    Additional Comments:  Dessa Phi, RN 04/25/2017, 11:46 AM

## 2017-04-25 NOTE — H&P (Deleted)
Physician Discharge Summary  Darlene Howard VOJ:[id number] DOB: 19-Dec-1951 DOA: 04/18/2017  PCP: [name], MD  Admit date: 04/18/2017 Discharge date: 04/25/2017  Admitted From: home Disposition:  Home  Recommendations for Outpatient Follow-up:  1. Follow up with PCP in 1-2 weeks 2. Please follow-up with Dr. [name] and to 4 weeks. 3. Home health check basic metabolic panel in [id number]/37/[id number] sen result to Dr. [name] 4.   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) Active Problems:   Multiple myeloma ([place])   Essential hypertension   Lower back pain   GERD (gastroesophageal reflux disease)   Anemia of chronic disease   Hypercalcemia of malignancy   Depression   HCAP (healthcare-associated pneumonia)   Hypokalemia   Malnutrition of moderate degree  Sepsis 2/2 HCAP: - CXR consistent with worsening PNA  - Initially treated with Zosyn, Vanc and Levaquin given high risk for MDR - MRSA PCR negative, Vanco d/ced, Received 5 days of Zosyn d/ced on 04/24/17 - change to oral Levaquin which she will cont for 8 additional days - Follow up blood cultures NGTD  - Leukocytosis improved  - PT/OT for home.  Hypercalcemia of malignancy - Corrected Ca 12.8 Patient received Zometa recently, another dose given 04/24/17, She was also started on IV lasix. Treated with calcitonin for 4 doses  And her sodium improved. Started trial of low-dose prednisone 20 mg daily. Recheck a calcium level as an  outpatient by home health send result to Dr. [name]  Multiple myeloma: Contributing to back and rib pain  Pain control as needed -  d/c on Percocet PRN and Oxycodone XR schedule, this pain regimen is working well for her.  Patient seen by Dr. [name], patient will continue with current management and will discuss cancer treatment as an outpatient. Patient declined palliative care consult  Anemia of chronic diseases - due to malignancy - stable S/p 3 units of PRBC's  Hgb stable   Hypokalemia;  - due to lasix, repeated orally, now resolved.   Discharge Instructions  Discharge Instructions    Diet - low sodium heart healthy    Complete by:  As directed    Increase activity slowly    Complete by:  As directed      Allergies as of 04/25/2017      Reactions   Codeine Nausea And Vomiting   Tramadol Nausea And Vomiting      Medication List    TAKE these medications   acetaminophen 500 MG tablet Commonly known as:  TYLENOL Take 500 mg by mouth every 6 (six) hours as needed for mild pain, moderate pain, fever or headache.   aspirin EC 81 MG tablet Take 81 mg by mouth daily.   dexamethasone 4 MG tablet Commonly known as:  DECADRON TAKE 10 TABS ONCE WEEKLY  WHILE UNDERGOING VELCADE  INJECTIONS. What changed:  See the new instructions.   dorzolamide-timolol 22.3-6.8 MG/ML ophthalmic solution Commonly known as:  COSOPT Place 1 drop into the left eye 2 (two) times daily.   feeding supplement (ENSURE ENLIVE) Liqd Take 237 mLs by mouth 2 (two) times daily between meals.   gabapentin 100  MG capsule Commonly known as:  NEURONTIN Take 100 mg by mouth daily.   guaiFENesin-dextromethorphan 100-10 MG/5ML syrup Commonly known as:  ROBITUSSIN DM Take 5 mLs by mouth every 4 (four) hours as needed for cough.   latanoprost 0.005 % ophthalmic solution Commonly known as:  XALATAN Place 1 drop into both eyes at bedtime.   levofloxacin 500 MG tablet Commonly known as:   LEVAQUIN Take 1 tablet (500 mg total) by mouth daily.   loperamide 2 MG capsule Commonly known as:  IMODIUM Take 2-4 mg by mouth every 2 (two) hours as needed for diarrhea or loose stools.   loratadine 10 MG tablet Commonly known as:  CLARITIN TAKE ONE TABLET BY MOUTH ONCE DAILY.   LUMIGAN 0.01 % Soln Generic drug:  bimatoprost Place 1 drop into both eyes at bedtime.   mirtazapine 30 MG tablet Commonly known as:  REMERON Take 1 tablet (30 mg total) by mouth at bedtime.   multivitamin with minerals Tabs tablet Take 1 tablet by mouth daily.   oxyCODONE 15 mg 12 hr tablet Commonly known as:  OXYCONTIN Take 1 tablet (15 mg total) by mouth every 12 (twelve) hours.   oxyCODONE-acetaminophen 5-325 MG tablet Commonly known as:  PERCOCET/ROXICET Take 1 tablet by mouth every 4 (four) hours as needed for severe pain. May take one tablet every 4-6 hours as needed for pain   pantoprazole 40 MG tablet Commonly known as:  PROTONIX TAKE 1 TABLET BY MOUTH  DAILY FOR ACID REFLUX   pomalidomide 4 MG capsule Commonly known as:  POMALYST Take 1 capsule (4 mg total) by mouth daily. Take with water on days 1-21. Repeat every 28 days.   potassium chloride SA 20 MEQ tablet Commonly known as:  K-DUR,KLOR-CON TAKE 1 TABLET BY MOUTH  TWICE A DAY   predniSONE 20 MG tablet Commonly known as:  DELTASONE Take 1 tablet (20 mg total) by mouth daily with breakfast. Start taking on:  04/26/2017   PREMARIN vaginal cream Generic drug:  conjugated estrogens PLACE 1 APPLICATORFUL VAGINALLY TWICE A WEEK.   temazepam 15 MG capsule Commonly known as:  RESTORIL TAKE 1 CAPSULE AT BEDTIME AS NEEDED FOR SLEEP.   valACYclovir 1000 MG tablet Commonly known as:  VALTREX TAKE 1 TABLET BY MOUTH  DAILY AS NEEDED What changed:  See the new instructions.            Durable Medical Equipment        Start     Ordered   04/23/17 1159  For home use only DME Hospital bed  Once    Question Answer Comment   Patient has (list medical condition): HCAP,Sepsis,Lung Ca, Multiple myeloma,hypercalcemia   The above medical condition requires: Patient requires the ability to reposition frequently   Head must be elevated greater than: 30 degrees   Bed type Semi-electric   Support Surface: Gel Overlay      04/23/17 1200     Follow-up Information    [facility] Follow up.   Why:  [place], physical therapy,occupational Visual merchandiser information: [place] [address] [phone]        [facility] - [facility] Follow up.   Why:  hospital bed Contact information: [place] [address] [phone]        Eilleen Kempf., MD Follow up.   Specialty:  Oncology Contact information: [place] [address] [phone]  Allergies  Allergen Reactions  . Codeine Nausea And Vomiting  . Tramadol Nausea And Vomiting    Consultations:  None   Procedures/Studies: Dg Chest 2 View  Result Date: 04/19/2017 CLINICAL DATA:  Acute onset of fever. Decreased O2 saturation. Initial encounter. EXAM: CHEST  2 VIEW COMPARISON:  Chest radiograph performed 04/12/2017 FINDINGS: The lungs are well-aerated. Increasing density of right perihilar airspace opacification raises concern for worsening pneumonia. Small bilateral pleural effusions are noted. No pneumothorax is seen. Chronic bilateral rib deformities are again noted, likely reflecting the patient's multiple myeloma. The heart is mildly enlarged. No acute osseous abnormalities are seen. There is chronic degenerative change at both glenohumeral joints. IMPRESSION: 1. Increasing density of right perihilar airspace opacification raises concern for worsening pneumonia. Followup PA and lateral chest X-ray is recommended in 3-4 weeks following trial of antibiotic therapy to ensure resolution and exclude underlying malignancy. 2.  Small bilateral pleural effusions. 3. Mild cardiomegaly. Electronically Signed   By: Garald Balding M.D.   On: 04/19/2017 00:26   Dg Chest 2 View  Result Date: 04/12/2017 CLINICAL DATA:  Fevers and recent pneumonia, history of myeloma EXAM: CHEST  2 VIEW COMPARISON:  04/03/2017 FINDINGS: Cardiac shadow is stable. The lungs are well aerated bilaterally. Old rib fractures are again seen bilaterally with interval healing. Fullness is again noted in the right suprahilar region consistent with focal infiltrate. No new mass lesion is seen. IMPRESSION: Stable appearing right suprahilar infiltrate. Chronic changes consistent with the known history of multiple myeloma. Electronically Signed   By: Inez Catalina M.D.   On: 04/12/2017 12:27   Dg Chest 2 View  Result Date: 04/03/2017 CLINICAL DATA:  Awakened today with increase cough an uncontrollable shaking. History of multiple myeloma on chemotherapy. EXAM: CHEST  2 VIEW COMPARISON:  Chest x-ray of April 25th 25 teen FINDINGS: The lungs are mildly hypoinflated. There is confluent alveolar opacity in the right perihilar region more conspicuous than on the previous study. This appears to reflect new infiltrate superimposed upon a posterior chest wall soft tissue mass. There are lateral left rib deformities which have appeared since the previous study. The cardiac silhouette is enlarged. The pulmonary vascularity is not engorged. There degenerative changes of both shoulders. The thoracic vertebral bodies are preserved in height. IMPRESSION: New right upper lobe pneumonia extending to the hilar region. Followup PA and lateral chest X-ray is recommended in 3-4 weeks following trial of antibiotic therapy to ensure resolution and exclude underlying malignancy. Persistent posterior right mid thoracic pleural based mass consistent with myelomatous involvement. New rib deformity on the left laterally involving the fifth and sixth ribs consistent with myelomatous involvement.  Electronically Signed   By: David  Martinique M.D.   On: 04/03/2017 09:55   Mr Lumbar Spine W Wo Contrast  Result Date: 04/18/2017 CLINICAL DATA:  66 year old female with multiple myeloma. Increased fatigue. Possible cord or nerve compression. EXAM: MRI LUMBAR SPINE WITHOUT AND WITH CONTRAST TECHNIQUE: Multiplanar and multiecho pulse sequences of the lumbar spine were obtained without and with intravenous contrast. CONTRAST:  25m MULTIHANCE GADOBENATE DIMEGLUMINE 529 MG/ML IV SOLN COMPARISON:  Skeletal survey 03/15/2017. CT Abdomen and Pelvis 04/24/2016. Lumbar MRI 03/01/2014 FINDINGS: Segmentation: Normal is demonstrated on the recent radiographs and this corresponds to the same numbering system used in 2015. Alignment: Chronic grade 1 anterolisthesis of L4 on L5 is stable since 2015. Chronic L2 superior endplate compression fracture is stable. A mild L1 superior endplate compression fracture has occurred since 2015, and is associated with  subtotal involvement of the superior L1 body by a tumor. Stable vertebral height and alignment elsewhere. Vertebrae: Disseminated multiple myeloma throughout the visible spine and pelvis. Associated widespread heterogeneous enhancement. Diffuse involvement of the visible spine, including multiple levels of posterior element involvement. There are areas of significant extraosseous extension of tumor, most notably laterally from the L3 vertebral body as demonstrated on series 5, image 26. However, there is no epidural or neural foraminal extension of tumor identified. Conus medullaris: Extends to the L1-L2 level and appears normal. No abnormal intradural enhancement. Paraspinal and other soft tissues: Bulky retrocrural lymphadenopathy. Negative visualized abdominal viscera. Low level edema in the bilateral erector spinae muscles. Disc levels: Superimposed chronic severe disc and posterior element degeneration in the lumbar spine: L2-L3 moderate spinal stenosis in part related to  bulky posterior disc extrusion is stable since 2015. Moderate to severe left L2 foraminal stenosis is stable. L3-L4 moderate multifactorial spinal and bilateral lateral recess stenosis is mildly improved since 2015. L4-L5 multifactorial severe spinal and lateral recess stenosis has mildly progressed since 2015. Moderate to severe left L4 foraminal stenosis is stable. L5-S1 multifactorial severe lateral recess and mild to moderate spinal stenosis is stable since 2015. Moderate to severe right L5 foraminal stenosis is stable. IMPRESSION: 1. Disseminated multiple myeloma throughout the visible spine and pelvis. Mild pathologic compression fracture of the L1 superior endplate could be acute or subacute. Mild L2 superior endplate compression fracture is chronic. 2. Areas of significant extraosseous extension of tumor, including into the right lateral paraspinal soft tissues at L3, but there is no epidural or neural foraminal extension. And thus no malignant neural impingement is identified. 3. Superimposed multilevel chronic degenerative moderate and severe spinal stenosis and neural impingement in the lumbar spine has not significantly changed overall since 2015. 4. Bulky retrocrural metastatic lymphadenopathy. Electronically Signed   By: Odessa Fleming M.D.   On: 04/18/2017 13:21   Dg Swallowing Func-speech Pathology  Result Date: 04/04/2017 Objective Swallowing Evaluation: Type of Study: Bedside Swallow Evaluation Patient Details Name: TYLASIA FLETCHALL MRN: [id number] Date of Birth: [birth year] Today's Date: 04/04/2017 Time: SLP Start Time (ACUTE ONLY): 1418-SLP Stop Time (ACUTE ONLY): 1433 SLP Time Calculation (min) (ACUTE ONLY): 15 min Past Medical History: Past Medical History: Diagnosis Date . Bruises easily   d/t being on COumadin . Cholelithiasis  . DDD (degenerative disc disease), lumbar  . Dehydration 03/11/2017 . Fatty liver  . GERD (gastroesophageal reflux disease)   takes Pantoprazole daily . Glaucoma   uses eye drops  as instructed . H/O stem cell transplant (HCC)   04-26-2006  AT [facility] . Herpes   takes Valtrex as needed . History of colon polyps  . Hypercalcemia of malignancy 03/11/2017 . Hypertension   takes Amlodipine and Diovan daily . Left radial fracture 01/21/2017 . Lung cancer (HCC)   chest wall lesion aadn 6th rib . Multiple myeloma DX  2006  STATE IIA,  IgA  KAPPA  STABLE  PER DR MOHAMED--  S/P STEM CELL TRANSPLANT 2007 /  RECURRENCE 2008  CHEMO ENDED 03-30-2008/   NOW ON MAINTANANCE REVLIMID ) . Neuropathy associated with multiple myeloma (HCC)   POST CHEMOTHERAPY AND STEM CELL TRANSPLANT . Personal history of colon cancer   ASCENDING COLON--  S/P  RIGHT HEMICOLECTOMY WITH NEGATIVE NODES/    NO RECURRENCE . Type 2 diabetes mellitus (HCC)  . Vocal cord nodule   resolved- see Dr. Avel Sensor notes in media tab 06/17/14 Past Surgical History: Past Surgical History: Procedure Laterality Date .  ABDOMINAL HYSTERECTOMY  1983  w/  appendectomy and left salpingoophorectom . CATARACT EXTRACTION W/PHACO Left 01/20/2014  Procedure: CATARACT EXTRACTION PHACO AND INTRAOCULAR LENS PLACEMENT (IOC);  Surgeon: Adonis Brook, MD;  Location: [facility];  Service: Ophthalmology;  Laterality: Left; . CERVICAL CONE BIOPSY   . CHOLECYSTECTOMY N/A 02/28/2016  Procedure: LAPAROSCOPIC CHOLECYSTECTOMY;  Surgeon: Ralene Ok, MD;  Location: [facility];  Service: General;  Laterality: N/A; . CO2  ABLATION VULVA PERIANAL AND VAGINAL DYSPLATIC AREAS  02-15-2010 . COLONOSCOPY  10/07/2006  GEX:BMWUXL rectum/Status post right hemicolectomy. Normal residual colon . COLONOSCOPY  10/2009  RMR: diminutive RS polyp (hyperplastic).  . COLONOSCOPY N/A 12/15/2014  Procedure: COLONOSCOPY;  Surgeon: Daneil Dolin, MD;  [facility];  Service: Endoscopy;  Laterality: N/A;  130  . ESOPHAGOGASTRODUODENOSCOPY  04/02/2005  KGM:[id number] polyp with central erosion versus ulceration in the body of the stomach, resected and recovered Remainder of the gastric mucosa,  D1, D2 appeared normal . HEMICOLECTOMY Right 04-27-2005  CARCINOMA   ASCENDING COLON . LESION REMOVAL N/A 05/06/2014  Procedure: LASER OF THE VAGINA;  Surgeon: Janie Morning, MD;  Location[facility];  Service: Gynecology;  Laterality: N/A; . LIMBAL STEM CELL TRANSPLANT  04-26-2006    ([facility]) . VULVAR LESION REMOVAL N/A 05/06/2014  Procedure: LASER OF VULVAR LESION;  Surgeon: Janie Morning, MD;  Location[facility];  Service: Gynecology;  Laterality: N/A; . VULVECTOMY N/A 05/06/2014  Procedure: WIDE LOCAL EXCISION LEFT LABIA MAJORA;  Surgeon: Janie Morning, MD;  Location: [facility];  Service: Gynecology;  Laterality: N/A; HPI:  66 y.o. 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  Pt found to have right UL pna, concerning for aspiration.  MD ordered MBS.   No Data Recorded Assessment / Plan / Recommendation CHL IP CLINICAL IMPRESSIONS 04/04/2017 Clinical Impression Functional oropharyngeal swallow ability without aspiration/ frank penetration of any consistency tested.  Pt has strong timely swallow without residuals.  Of note, pt did cough x1 during testing.  Upon esophageal sweep at end of testing, pt appeared with slow clearance distally without awareness.  Water consumption faciliated clearance. Pt may benefit from dedicated esophageal evaluation if MD indicates.  Using live video, educated pt and her sister [name] to findings.  Thanks for this referral. NO SLP follow up indicated.  SLP Visit Diagnosis Dysphagia, unspecified (R13.10) Attention and concentration deficit following -- Frontal lobe and executive function deficit following -- Impact on safety and function Mild aspiration risk   No flowsheet data found.  No flowsheet data found. CHL IP DIET  RECOMMENDATION 04/04/2017 SLP Diet Recommendations Regular solids;Thin liquid Liquid Administration via Cup;Straw Medication Administration Whole meds with liquid Compensations Slow rate;Small sips/bites Postural Changes Remain semi-upright after after feeds/meals (Comment);Seated upright at 90 degrees   CHL IP OTHER RECOMMENDATIONS 04/04/2017 Recommended Consults Consider esophageal assessment Oral Care Recommendations Oral care BID Other Recommendations --   CHL IP FOLLOW UP RECOMMENDATIONS 04/04/2017 Follow up Recommendations None   No flowsheet data found.     CHL IP ORAL PHASE 04/04/2017 Oral Phase WFL Oral - Pudding Teaspoon -- Oral - Pudding Cup -- Oral - Honey Teaspoon -- Oral - Honey Cup -- Oral - Nectar Teaspoon -- Oral - Nectar Cup WFL Oral - Nectar Straw -- Oral - Thin Teaspoon --  Oral - Thin Cup [facility] Oral - Thin Straw WFL Oral - Puree WFL Oral - Mech Soft -- Oral - Regular WFL Oral - Multi-Consistency -- Oral - Pill WFL Oral Phase - Comment --  CHL IP PHARYNGEAL PHASE 04/04/2017 Pharyngeal Phase WFL Pharyngeal- Pudding Teaspoon -- Pharyngeal -- Pharyngeal- Pudding Cup -- Pharyngeal -- Pharyngeal- Honey Teaspoon -- Pharyngeal -- Pharyngeal- Honey Cup -- Pharyngeal -- Pharyngeal- Nectar Teaspoon -- Pharyngeal -- Pharyngeal- Nectar Cup WFL Pharyngeal -- Pharyngeal- Nectar Straw -- Pharyngeal -- Pharyngeal- Thin Teaspoon -- Pharyngeal -- Pharyngeal- Thin Cup WFL;Penetration/Aspiration during swallow Pharyngeal Material enters airway, remains ABOVE vocal cords then ejected out Pharyngeal- Thin Straw WFL Pharyngeal -- Pharyngeal- Puree WFL Pharyngeal -- Pharyngeal- Mechanical Soft -- Pharyngeal -- Pharyngeal- Regular WFL Pharyngeal -- Pharyngeal- Multi-consistency -- Pharyngeal -- Pharyngeal- Pill WFL Pharyngeal -- Pharyngeal Comment --  CHL IP CERVICAL ESOPHAGEAL PHASE 04/04/2017 Cervical Esophageal Phase WFL Pudding Teaspoon -- Pudding Cup -- Honey Teaspoon -- Honey Cup -- Nectar Teaspoon -- Nectar Cup -- Nectar Straw  -- Thin Teaspoon -- Thin Cup -- Thin Straw -- Puree -- Mechanical Soft -- Regular -- Multi-consistency -- Pill -- Cervical Esophageal Comment -- No flowsheet data found. Janett Labella [place], [facility] [phone]                 Subjective: No complains  Discharge Exam: Vitals:   04/24/17 2054 04/25/17 0457  BP: (!) 148/91 (!) 170/85  Pulse: (!) 121 (!) 111  Resp:  20  Temp: 98.7 F (37.1 C) 98.4 F (36.9 C)   Vitals:   04/24/17 0535 04/24/17 1333 04/24/17 2054 04/25/17 0457  BP: 138/64 (!) 143/76 (!) 148/91 (!) 170/85  Pulse: (!) 101 (!) 103 (!) 121 (!) 111  Resp: _0 Temp: 98.5 F (36.9 C) 98.1 F (36.7 C) 98.7 F (37.1 C) 98.4 F (36.9 C)  TempSrc: Oral Oral Oral Oral  SpO2: 96% 96% 95% 97%  Weight:      Height:        General: Patient is awake alert and oriented 3 no acute distress Cardiovascular: Regular rate and rhythm with positive S1-S2 no murmurs rubs gallops Respiratory: Good air movement and clear to auscultation. Abdominal: Positive bowel sounds nontender nondistended Extremities: no edema, no cyanosis    The results of significant diagnostics from this hospitalization (including imaging, microbiology, ancillary and laboratory) are listed below for reference.     Microbiology: Recent Results (from the past 240 hour(s))  Culture, blood (Routine x 2)     Status: None   Collection Time: 04/19/17  1:05 AM  Result Value Ref Range Status   Specimen Description BLOOD RIGHT ANTECUBITAL  Final   Special Requests   Final    BOTTLES DRAWN AEROBIC AND ANAEROBIC Blood Culture results may not be optimal due to an inadequate volume of blood received in culture bottles   Culture   Final    NO GROWTH 5 DAYS Performed at [facility], [place] [address] [place], [place] [address]    Report Status 04/24/2017 FINAL  Final  Culture, blood (Routine x 2)     Status: None   Collection Time: 04/19/17  1:05 AM  Result Value Ref Range Status    Specimen Description BLOOD LEFT HAND  Final   Special Requests   Final    BOTTLES DRAWN AEROBIC AND ANAEROBIC Blood Culture adequate volume   Culture   Final    NO GROWTH 5 DAYS  Performed at [facility], [place] [address], [place], [place] [address]    Report Status 04/24/2017 FINAL  Final  Urine culture     Status: Abnormal   Collection Time: 04/19/17  2:58 AM  Result Value Ref Range Status   Specimen Description URINE, CLEAN CATCH  Final   Special Requests NONE  Final   Culture 60,000 COLONIES/mL YEAST (A)  Final   Report Status 04/20/2017 FINAL  Final  MRSA PCR Screening     Status: None   Collection Time: 04/22/17  8:53 AM  Result Value Ref Range Status   MRSA by PCR NEGATIVE NEGATIVE Final    Comment:        The GeneXpert MRSA Assay (FDA approved for NASAL specimens only), is one component of a comprehensive MRSA colonization surveillance program. It is not intended to diagnose MRSA infection nor to guide or monitor treatment for MRSA infections.      Labs: BNP (last 3 results)  Recent Labs  04/20/17 2057  BNP 606.0*   Basic Metabolic Panel:  Recent Labs Lab 04/20/17 2057 04/21/17 1521 04/22/17 0550 04/23/17 0624 04/24/17 0544 04/25/17 0630  NA 140 145 142 144 141 142  K 2.6* 2.9* 3.2* 2.5* 2.3* 3.0*  CL 109 108 107 106 103 106  CO2 21* _0 GLUCOSE 81 125* 131* 104* 92 80  BUN _1 CREATININE 0.68 0.77 0.73 0.69 0.66 0.68  CALCIUM 11.5* 11.7* 11.8* 11.3* 11.3* 12.0*  MG 1.4*  --   --  1.6* 1.8 1.8   Liver Function Tests:  Recent Labs Lab 04/19/17 0105 04/21/17 1521 04/22/17 0550 04/23/17 0624 04/24/17 0544  AST 35  --   --  29 26  ALT 9*  --   --  9* 9*  ALKPHOS 65  --   --  73 69  BILITOT 0.7  --   --  0.7 0.6  PROT 8.0  --   --  7.6 7.4  ALBUMIN 2.3* 2.0* 2.2* 2.1* 2.1*   No results for input(s): LIPASE, AMYLASE in the last 168 hours. No results for input(s): AMMONIA in the last 168 hours. CBC:  Recent  Labs Lab 04/19/17 0105  04/20/17 0543 04/20/17 2057 04/21/17 1521 04/22/17 0550 04/23/17 0624  WBC 21.4*  --  15.8*  --  11.8* 14.4* 12.5*  NEUTROABS 17.5*  --  12.6*  --  9.5* 11.7* 10.0*  HGB 8.0*  < > 6.9* 7.1* 11.4* 11.9* 11.2*  HCT 25.3*  < > 21.6* 23.3* 33.6* 34.6* 32.9*  MCV 93.4  --  94.7  --  88.0 88.3 89.6  PLT 187  --  150  --  145* 163 157  < > = values in this interval not displayed. Cardiac Enzymes: No results for input(s): CKTOTAL, CKMB, CKMBINDEX, TROPONINI in the last 168 hours. BNP: Invalid input(s): POCBNP CBG: No results for input(s): GLUCAP in the last 168 hours. D-Dimer No results for input(s): DDIMER in the last 72 hours. Hgb A1c No results for input(s): HGBA1C in the last 72 hours. Lipid Profile No results for input(s): CHOL, HDL, LDLCALC, TRIG, CHOLHDL, LDLDIRECT in the last 72 hours. Thyroid function studies No results for input(s): TSH, T4TOTAL, T3FREE, THYROIDAB in the last 72 hours.  Invalid input(s): FREET3 Anemia work up No results for input(s): VITAMINB12, FOLATE, FERRITIN, TIBC, IRON, RETICCTPCT in the last 72 hours. Urinalysis    Component Value Date/Time   COLORURINE YELLOW  04/19/2017 0500   APPEARANCEUR CLOUDY (A) 04/19/2017 0500   LABSPEC 1.025 04/19/2017 0500   PHURINE 5.0 04/19/2017 0500   GLUCOSEU NEGATIVE 04/19/2017 0500   HGBUR SMALL (A) 04/19/2017 0500   BILIRUBINUR NEGATIVE 04/19/2017 0500   BILIRUBINUR neg 03/09/2015 1333   KETONESUR NEGATIVE 04/19/2017 0500   PROTEINUR 100 (A) 04/19/2017 0500   UROBILINOGEN 0.2 03/09/2015 1333   NITRITE NEGATIVE 04/19/2017 0500   LEUKOCYTESUR MODERATE (A) 04/19/2017 0500   Sepsis Labs Invalid input(s): PROCALCITONIN,  WBC,  LACTICIDVEN Microbiology Recent Results (from the past 240 hour(s))  Culture, blood (Routine x 2)     Status: None   Collection Time: 04/19/17  1:05 AM  Result Value Ref Range Status   Specimen Description BLOOD RIGHT ANTECUBITAL  Final   Special Requests    Final    BOTTLES DRAWN AEROBIC AND ANAEROBIC Blood Culture results may not be optimal due to an inadequate volume of blood received in culture bottles   Culture   Final    NO GROWTH 5 DAYS Performed at [facility], [place] [address], [place], [place] [id number]    Report Status 04/24/2017 FINAL  Final  Culture, blood (Routine x 2)     Status: None   Collection Time: 04/19/17  1:05 AM  Result Value Ref Range Status   Specimen Description BLOOD LEFT HAND  Final   Special Requests   Final    BOTTLES DRAWN AEROBIC AND ANAEROBIC Blood Culture adequate volume   Culture   Final    NO GROWTH 5 DAYS Performed at [facility], [place] [address] [place], [place] [address]    Report Status 04/24/2017 FINAL  Final  Urine culture     Status: Abnormal   Collection Time: 04/19/17  2:58 AM  Result Value Ref Range Status   Specimen Description URINE, CLEAN CATCH  Final   Special Requests NONE  Final   Culture 60,000 COLONIES/mL YEAST (A)  Final   Report Status 04/20/2017 FINAL  Final  MRSA PCR Screening     Status: None   Collection Time: 04/22/17  8:53 AM  Result Value Ref Range Status   MRSA by PCR NEGATIVE NEGATIVE Final    Comment:        The GeneXpert MRSA Assay (FDA approved for NASAL specimens only), is one component of a comprehensive MRSA colonization surveillance program. It is not intended to diagnose MRSA infection nor to guide or monitor treatment for MRSA infections.      Time coordinating discharge: Over 30 minutes  SIGNED:   Charlynne Cousins, MD  [facility] 04/25/2017, 12:10 PM Pager   If 7PM-7AM, please contact night-coverage [email or web address] Password [email or web address]

## 2017-04-26 ENCOUNTER — Other Ambulatory Visit: Payer: Self-pay | Admitting: Medical Oncology

## 2017-04-26 DIAGNOSIS — G620 Drug-induced polyneuropathy: Secondary | ICD-10-CM | POA: Diagnosis not present

## 2017-04-26 DIAGNOSIS — T451X5D Adverse effect of antineoplastic and immunosuppressive drugs, subsequent encounter: Secondary | ICD-10-CM | POA: Diagnosis not present

## 2017-04-26 DIAGNOSIS — J181 Lobar pneumonia, unspecified organism: Secondary | ICD-10-CM | POA: Diagnosis not present

## 2017-04-26 DIAGNOSIS — C9000 Multiple myeloma not having achieved remission: Secondary | ICD-10-CM | POA: Diagnosis not present

## 2017-04-26 DIAGNOSIS — E119 Type 2 diabetes mellitus without complications: Secondary | ICD-10-CM | POA: Diagnosis not present

## 2017-04-27 DIAGNOSIS — J181 Lobar pneumonia, unspecified organism: Secondary | ICD-10-CM | POA: Diagnosis not present

## 2017-04-27 DIAGNOSIS — C9000 Multiple myeloma not having achieved remission: Secondary | ICD-10-CM | POA: Diagnosis not present

## 2017-04-27 DIAGNOSIS — E119 Type 2 diabetes mellitus without complications: Secondary | ICD-10-CM | POA: Diagnosis not present

## 2017-04-27 DIAGNOSIS — G620 Drug-induced polyneuropathy: Secondary | ICD-10-CM | POA: Diagnosis not present

## 2017-04-27 DIAGNOSIS — T451X5D Adverse effect of antineoplastic and immunosuppressive drugs, subsequent encounter: Secondary | ICD-10-CM | POA: Diagnosis not present

## 2017-04-27 NOTE — Discharge Summary (Signed)
Physician Discharge Summary  Darlene Howard HFG:[id number] DOB: [birth year] DOA: 04/18/2017  PCP: [name], MD  Admit date: 04/18/2017 Discharge date: 04/27/2017  Admitted From: home Disposition:  Home  Recommendations for Outpatient Follow-up:  1. Follow up with PCP in 1-2 weeks 2. Please follow-up with Dr. [name] and to 4 weeks. 3. Home health check basic metabolic panel in 08/02/2335 sen result to Dr. [name] 4.   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, who presents with fever, cough, generalized weakness, back pain and leg weakness  Discharge Diagnoses:  Principal Problem:   Sepsis ([place]) Active Problems:   Multiple myeloma ([place])   Essential hypertension   Lower back pain   GERD (gastroesophageal reflux disease)   Anemia of chronic disease   Hypercalcemia of malignancy   Depression   HCAP (healthcare-associated pneumonia)   Hypokalemia   Malnutrition of moderate degree  Sepsis 2/2 HCAP: - CXR consistent with worsening PNA  - Initially treated with Zosyn, Vanc and Levaquin given high risk for MDR - MRSA PCR negative, Vanco d/ced, Received 5 days of Zosyn d/ced on 04/24/17 - change to oral Levaquin which she will cont for 8 additional days - Follow up blood cultures NGTD  - Leukocytosis improved  - PT/OT for home.  Hypercalcemia of malignancy - Corrected Ca 12.8 Patient received Zometa recently, another dose given 04/24/17, She was also started on IV lasix. Treated with calcitonin for 4 doses  And her sodium improved. Started trial of low-dose prednisone 20 mg daily. Recheck a calcium level as an outpatient by home health send result to Dr.  [name]  Multiple myeloma: Contributing to back and rib pain  Pain control as needed -  d/c on Percocet PRN and Oxycodone XR schedule, this pain regimen is working well for her.  Patient seen by Dr. [name], patient will continue with current management and will discuss cancer treatment as an outpatient. Patient declined palliative care consult  Anemia of chronic diseases - due to malignancy - stable S/p 3 units of PRBC's  Hgb stable   Hypokalemia;  - due to lasix, repeated orally, now resolved.   Discharge Instructions  Discharge Instructions    Diet - low sodium heart healthy    Complete by:  As directed    Increase activity slowly    Complete by:  As directed      Allergies as of 04/25/2017      Reactions   Codeine Nausea And Vomiting   Tramadol Nausea And Vomiting      Medication List    TAKE these medications   acetaminophen 500 MG tablet Commonly known as:  TYLENOL Take 500 mg by mouth every 6 (six) hours as needed for mild pain, moderate pain, fever or headache.   aspirin EC 81 MG tablet Take 81 mg by mouth daily.   dexamethasone 4 MG tablet Commonly known as:  DECADRON TAKE 10 TABS ONCE WEEKLY  WHILE UNDERGOING VELCADE  INJECTIONS. What changed:  See the new instructions.   dorzolamide-timolol 22.3-6.8 MG/ML ophthalmic solution Commonly known as:  COSOPT Place 1 drop into the left eye 2 (two) times daily.   feeding supplement (ENSURE ENLIVE) Liqd Take 237 mLs by mouth 2 (two) times daily between meals.   gabapentin 100 MG capsule Commonly known as:  NEURONTIN Take  100 mg by mouth daily.   guaiFENesin-dextromethorphan 100-10 MG/5ML syrup Commonly known as:  ROBITUSSIN DM Take 5 mLs by mouth every 4 (four) hours as needed for cough.   latanoprost 0.005 % ophthalmic solution Commonly known as:  XALATAN Place 1 drop into both eyes at bedtime.   levofloxacin 500 MG tablet Commonly known as:  LEVAQUIN Take 1 tablet (500 mg total) by mouth  daily.   loperamide 2 MG capsule Commonly known as:  IMODIUM Take 2-4 mg by mouth every 2 (two) hours as needed for diarrhea or loose stools.   loratadine 10 MG tablet Commonly known as:  CLARITIN TAKE ONE TABLET BY MOUTH ONCE DAILY.   LUMIGAN 0.01 % Soln Generic drug:  bimatoprost Place 1 drop into both eyes at bedtime.   mirtazapine 30 MG tablet Commonly known as:  REMERON Take 1 tablet (30 mg total) by mouth at bedtime.   multivitamin with minerals Tabs tablet Take 1 tablet by mouth daily.   oxyCODONE 15 mg 12 hr tablet Commonly known as:  OXYCONTIN Take 1 tablet (15 mg total) by mouth every 12 (twelve) hours.   oxyCODONE-acetaminophen 5-325 MG tablet Commonly known as:  PERCOCET/ROXICET Take 1 tablet by mouth every 4 (four) hours as needed for severe pain. May take one tablet every 4-6 hours as needed for pain   pantoprazole 40 MG tablet Commonly known as:  PROTONIX TAKE 1 TABLET BY MOUTH  DAILY FOR ACID REFLUX   pomalidomide 4 MG capsule Commonly known as:  POMALYST Take 1 capsule (4 mg total) by mouth daily. Take with water on days 1-21. Repeat every 28 days.   potassium chloride SA 20 MEQ tablet Commonly known as:  K-DUR,KLOR-CON Take 1 tablet (20 mEq total) by mouth 2 (two) times daily. What changed:  See the new instructions.   predniSONE 20 MG tablet Commonly known as:  DELTASONE Take 1 tablet (20 mg total) by mouth daily with breakfast.   PREMARIN vaginal cream Generic drug:  conjugated estrogens PLACE 1 APPLICATORFUL VAGINALLY TWICE A WEEK.   temazepam 15 MG capsule Commonly known as:  RESTORIL TAKE 1 CAPSULE AT BEDTIME AS NEEDED FOR SLEEP.   valACYclovir 1000 MG tablet Commonly known as:  VALTREX TAKE 1 TABLET BY MOUTH  DAILY AS NEEDED What changed:  See the new instructions.      Follow-up Information    [facility] Follow up.   Why:  [place], physical therapy,occupational Visual merchandiser  information: [place] [id number] [phone]        Inc[facility] - Dme [facility] Follow up.   Why:  hospital bed Contact information: [place] [address] [phone]        Eilleen Kempf., MD Follow up.   Specialty:  Oncology Contact information: [address] [place] [address] [phone]          Allergies  Allergen Reactions  . Codeine Nausea And Vomiting  . Tramadol Nausea And Vomiting    Consultations:  None   Procedures/Studies: Dg Chest 2 View  Result Date: 04/19/2017 CLINICAL DATA:  Acute onset of fever. Decreased O2 saturation. Initial encounter. EXAM: CHEST  2 VIEW COMPARISON:  Chest radiograph performed 04/12/2017 FINDINGS: The lungs are well-aerated. Increasing density of right perihilar airspace opacification raises concern for worsening pneumonia. Small bilateral pleural effusions are noted. No pneumothorax is seen. Chronic bilateral rib deformities are again noted, likely reflecting the patient's multiple myeloma. The heart  is mildly enlarged. No acute osseous abnormalities are seen. There is chronic degenerative change at both glenohumeral joints. IMPRESSION: 1. Increasing density of right perihilar airspace opacification raises concern for worsening pneumonia. Followup PA and lateral chest X-ray is recommended in 3-4 weeks following trial of antibiotic therapy to ensure resolution and exclude underlying malignancy. 2. Small bilateral pleural effusions. 3. Mild cardiomegaly. Electronically Signed   By: Garald Balding M.D.   On: 04/19/2017 00:26   Dg Chest 2 View  Result Date: 04/12/2017 CLINICAL DATA:  Fevers and recent pneumonia, history of myeloma EXAM: CHEST  2 VIEW COMPARISON:  04/03/2017 FINDINGS: Cardiac shadow is stable. The lungs are well aerated bilaterally. Old rib fractures are again seen bilaterally with interval healing. Fullness is again noted in the right suprahilar region  consistent with focal infiltrate. No new mass lesion is seen. IMPRESSION: Stable appearing right suprahilar infiltrate. Chronic changes consistent with the known history of multiple myeloma. Electronically Signed   By: Inez Catalina M.D.   On: 04/12/2017 12:27   Dg Chest 2 View  Result Date: 04/03/2017 CLINICAL DATA:  Awakened today with increase cough an uncontrollable shaking. History of multiple myeloma on chemotherapy. EXAM: CHEST  2 VIEW COMPARISON:  Chest x-ray of April 25th 25 teen FINDINGS: The lungs are mildly hypoinflated. There is confluent alveolar opacity in the right perihilar region more conspicuous than on the previous study. This appears to reflect new infiltrate superimposed upon a posterior chest wall soft tissue mass. There are lateral left rib deformities which have appeared since the previous study. The cardiac silhouette is enlarged. The pulmonary vascularity is not engorged. There degenerative changes of both shoulders. The thoracic vertebral bodies are preserved in height. IMPRESSION: New right upper lobe pneumonia extending to the hilar region. Followup PA and lateral chest X-ray is recommended in 3-4 weeks following trial of antibiotic therapy to ensure resolution and exclude underlying malignancy. Persistent posterior right mid thoracic pleural based mass consistent with myelomatous involvement. New rib deformity on the left laterally involving the fifth and sixth ribs consistent with myelomatous involvement. Electronically Signed   By: David  Martinique M.D.   On: 04/03/2017 09:55   Mr Lumbar Spine W Wo Contrast  Result Date: 04/18/2017 CLINICAL DATA:  66 year old female with multiple myeloma. Increased fatigue. Possible cord or nerve compression. EXAM: MRI LUMBAR SPINE WITHOUT AND WITH CONTRAST TECHNIQUE: Multiplanar and multiecho pulse sequences of the lumbar spine were obtained without and with intravenous contrast. CONTRAST:  70m MULTIHANCE GADOBENATE DIMEGLUMINE 529 MG/ML IV SOLN  COMPARISON:  Skeletal survey 03/15/2017. CT Abdomen and Pelvis 04/24/2016. Lumbar MRI 03/01/2014 FINDINGS: Segmentation: Normal is demonstrated on the recent radiographs and this corresponds to the same numbering system used in 2015. Alignment: Chronic grade 1 anterolisthesis of L4 on L5 is stable since 2015. Chronic L2 superior endplate compression fracture is stable. A mild L1 superior endplate compression fracture has occurred since 2015, and is associated with subtotal involvement of the superior L1 body by a tumor. Stable vertebral height and alignment elsewhere. Vertebrae: Disseminated multiple myeloma throughout the visible spine and pelvis. Associated widespread heterogeneous enhancement. Diffuse involvement of the visible spine, including multiple levels of posterior element involvement. There are areas of significant extraosseous extension of tumor, most notably laterally from the L3 vertebral body as demonstrated on series 5, image 26. However, there is no epidural or neural foraminal extension of tumor identified. Conus medullaris: Extends to the L1-L2 level and appears normal. No abnormal intradural enhancement. Paraspinal and other soft tissues:  Bulky retrocrural lymphadenopathy. Negative visualized abdominal viscera. Low level edema in the bilateral erector spinae muscles. Disc levels: Superimposed chronic severe disc and posterior element degeneration in the lumbar spine: L2-L3 moderate spinal stenosis in part related to bulky posterior disc extrusion is stable since 2015. Moderate to severe left L2 foraminal stenosis is stable. L3-L4 moderate multifactorial spinal and bilateral lateral recess stenosis is mildly improved since 2015. L4-L5 multifactorial severe spinal and lateral recess stenosis has mildly progressed since 2015. Moderate to severe left L4 foraminal stenosis is stable. L5-S1 multifactorial severe lateral recess and mild to moderate spinal stenosis is stable since 2015. Moderate to  severe right L5 foraminal stenosis is stable. IMPRESSION: 1. Disseminated multiple myeloma throughout the visible spine and pelvis. Mild pathologic compression fracture of the L1 superior endplate could be acute or subacute. Mild L2 superior endplate compression fracture is chronic. 2. Areas of significant extraosseous extension of tumor, including into the right lateral paraspinal soft tissues at L3, but there is no epidural or neural foraminal extension. And thus no malignant neural impingement is identified. 3. Superimposed multilevel chronic degenerative moderate and severe spinal stenosis and neural impingement in the lumbar spine has not significantly changed overall since 2015. 4. Bulky retrocrural metastatic lymphadenopathy. Electronically Signed   By: Genevie Ann M.D.   On: 04/18/2017 13:21   Dg Swallowing Func-speech Pathology  Result Date: 04/04/2017 Objective Swallowing Evaluation: Type of Study: Bedside Swallow Evaluation Patient Details Name: FRANCESSCA FRIIS MRN: [id number] Date of Birth: 1951-12-27 Today's Date: 04/04/2017 Time: SLP Start Time (ACUTE ONLY): 1418-SLP Stop Time (ACUTE ONLY): 1433 SLP Time Calculation (min) (ACUTE ONLY): 15 min Past Medical History: Past Medical History: Diagnosis Date . Bruises easily   d/t being on COumadin . Cholelithiasis  . DDD (degenerative disc disease), lumbar  . Dehydration 03/11/2017 . Fatty liver  . GERD (gastroesophageal reflux disease)   takes Pantoprazole daily . Glaucoma   uses eye drops as instructed . H/O stem cell transplant ([place])   04-26-2006  AT [facility] . Herpes   takes Valtrex as needed . History of colon polyps  . Hypercalcemia of malignancy 03/11/2017 . Hypertension   takes Amlodipine and Diovan daily . Left radial fracture 01/21/2017 . Lung cancer ([place])   chest wall lesion aadn 6th rib . Multiple myeloma DX  2006  STATE IIA,  IgA  KAPPA  STABLE  PER DR MOHAMED--  S/P STEM CELL TRANSPLANT 2007 /  RECURRENCE 2008  CHEMO ENDED 03-30-2008/   NOW ON MAINTANANCE  REVLIMID ) . Neuropathy associated with multiple myeloma ([facility])   POST CHEMOTHERAPY AND STEM CELL TRANSPLANT . Personal history of colon cancer   ASCENDING COLON--  S/P  RIGHT HEMICOLECTOMY WITH NEGATIVE NODES/    NO RECURRENCE . Type 2 diabetes mellitus ([place])  . Vocal cord nodule   resolved- see Dr. Deeann Saint notes in media tab 06/17/14 Past Surgical History: Past Surgical History: Procedure Laterality Date . ABDOMINAL HYSTERECTOMY  1983  w/  appendectomy and left salpingoophorectom . CATARACT EXTRACTION W/PHACO Left 01/20/2014  Procedure: CATARACT EXTRACTION PHACO AND INTRAOCULAR LENS PLACEMENT (IOC);  Surgeon: Adonis Brook, MD;  Location: [facility];  Service: Ophthalmology;  Laterality: Left; . CERVICAL CONE BIOPSY   . CHOLECYSTECTOMY N/A 02/28/2016  Procedure: LAPAROSCOPIC CHOLECYSTECTOMY;  Surgeon: Ralene Ok, MD;  Location: [facility];  Service: General;  Laterality: N/A; . CO2  ABLATION VULVA PERIANAL AND VAGINAL DYSPLATIC AREAS  02-15-2010 . COLONOSCOPY  10/07/2006  DHR:CBULAG rectum/Status post right hemicolectomy. Normal residual colon . COLONOSCOPY  10/2009  RMR: diminutive RS polyp (hyperplastic).  . COLONOSCOPY N/A 12/15/2014  Procedure: COLONOSCOPY;  Surgeon: Daneil Dolin, MD;  [facility];  Service: Endoscopy;  Laterality: N/A;  130  . ESOPHAGOGASTRODUODENOSCOPY  04/02/2005  GHW:EXHBZJ/IRCVELFYBOFB polyp with central erosion versus ulceration in the body of the stomach, resected and recovered Remainder of the gastric mucosa, D1, D2 appeared normal . HEMICOLECTOMY Right 04-27-2005  CARCINOMA   ASCENDING COLON . LESION REMOVAL N/A 05/06/2014  Procedure: LASER OF THE VAGINA;  Surgeon: Janie Morning, MD;  Location: [facility];  Service: Gynecology;  Laterality: N/A; . LIMBAL STEM CELL TRANSPLANT  04-26-2006    ([facility]) . VULVAR LESION REMOVAL N/A 05/06/2014  Procedure: LASER OF VULVAR LESION;  Surgeon: Janie Morning, MD;  Location: [facility];  Service: Gynecology;   Laterality: N/A; . VULVECTOMY N/A 05/06/2014  Procedure: WIDE LOCAL EXCISION LEFT LABIA MAJORA;  Surgeon: Janie Morning, MD;  Location: [facility];  Service: Gynecology;  Laterality: N/A; HPI:  66 y.o. 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  Pt found to have right UL pna, concerning for aspiration.  MD ordered MBS.   No Data Recorded Assessment / Plan / Recommendation CHL IP CLINICAL IMPRESSIONS 04/04/2017 Clinical Impression Functional oropharyngeal swallow ability without aspiration/ frank penetration of any consistency tested.  Pt has strong timely swallow without residuals.  Of note, pt did cough x1 during testing.  Upon esophageal sweep at end of testing, pt appeared with slow clearance distally without awareness.  Water consumption faciliated clearance. Pt may benefit from dedicated esophageal evaluation if MD indicates.  Using live video, educated pt and her sister [name] to findings.  Thanks for this referral. NO SLP follow up indicated.  SLP Visit Diagnosis Dysphagia, unspecified (R13.10) Attention and concentration deficit following -- Frontal lobe and executive function deficit following -- Impact on safety and function Mild aspiration risk   No flowsheet data found.  No flowsheet data found. CHL IP DIET RECOMMENDATION 04/04/2017 SLP Diet Recommendations Regular solids;Thin liquid Liquid Administration via Cup;Straw Medication Administration Whole meds with liquid Compensations Slow rate;Small sips/bites Postural Changes Remain semi-upright after after feeds/meals (Comment);Seated upright at 90 degrees   CHL IP OTHER RECOMMENDATIONS 04/04/2017 Recommended Consults Consider esophageal assessment Oral Care Recommendations Oral care BID Other Recommendations --   CHL IP  FOLLOW UP RECOMMENDATIONS 04/04/2017 Follow up Recommendations None   No flowsheet data found.     CHL IP ORAL PHASE 04/04/2017 Oral Phase WFL Oral - Pudding Teaspoon -- Oral - Pudding Cup -- Oral - Honey Teaspoon -- Oral - Honey Cup -- Oral - Nectar Teaspoon -- Oral - Nectar Cup WFL Oral - Nectar Straw -- Oral - Thin Teaspoon -- Oral - Thin Cup WFL Oral - Thin Straw WFL Oral - Puree WFL Oral - Mech Soft -- Oral - Regular WFL Oral - Multi-Consistency -- Oral - Pill WFL Oral Phase - Comment --  CHL IP PHARYNGEAL PHASE 04/04/2017 Pharyngeal Phase WFL Pharyngeal- Pudding Teaspoon -- Pharyngeal -- Pharyngeal- Pudding Cup -- Pharyngeal -- Pharyngeal- Honey Teaspoon -- Pharyngeal -- Pharyngeal- Honey Cup -- Pharyngeal -- Pharyngeal- Nectar Teaspoon -- Pharyngeal -- Pharyngeal- Nectar Cup WFL Pharyngeal -- Pharyngeal- Nectar Straw -- Pharyngeal -- Pharyngeal- Thin Teaspoon -- Pharyngeal -- Pharyngeal- Thin Cup WFL;Penetration/Aspiration during swallow  Pharyngeal Material enters airway, remains ABOVE vocal cords then ejected out Pharyngeal- Thin Straw WFL Pharyngeal -- Pharyngeal- Puree WFL Pharyngeal -- Pharyngeal- Mechanical Soft -- Pharyngeal -- Pharyngeal- Regular WFL Pharyngeal -- Pharyngeal- Multi-consistency -- Pharyngeal -- Pharyngeal- Pill WFL Pharyngeal -- Pharyngeal Comment --  CHL IP CERVICAL ESOPHAGEAL PHASE 04/04/2017 Cervical Esophageal Phase WFL Pudding Teaspoon -- Pudding Cup -- Honey Teaspoon -- Honey Cup -- Nectar Teaspoon -- Nectar Cup -- Nectar Straw -- Thin Teaspoon -- Thin Cup -- Thin Straw -- Puree -- Mechanical Soft -- Regular -- Multi-consistency -- Pill -- Cervical Esophageal Comment -- No flowsheet data found. Janett Labella [place], [place] [facility] [phone]                Subjective: No complains  Discharge Exam: Vitals:   04/25/17 0457 04/25/17 1531  BP: (!) 170/85 (!) 174/95  Pulse: (!) 111 (!) 112  Resp: 20 20  Temp: 98.4 F (36.9 C) 99.6 F (37.6 C)   Vitals:   04/24/17 1333  04/24/17 2054 04/25/17 0457 04/25/17 1531  BP: (!) 143/76 (!) 148/91 (!) 170/85 (!) 174/95  Pulse: (!) 103 (!) 121 (!) 111 (!) 112  Resp: '18  20 20  ' Temp: 98.1 F (36.7 C) 98.7 F (37.1 C) 98.4 F (36.9 C) 99.6 F (37.6 C)  TempSrc: Oral Oral Oral Oral  SpO2: 96% 95% 97% 94%  Weight:      Height:        General: Patient is awake alert and oriented 3 no acute distress Cardiovascular: Regular rate and rhythm with positive S1-S2 no murmurs rubs gallops Respiratory: Good air movement and clear to auscultation. Abdominal: Positive bowel sounds nontender nondistended Extremities: no edema, no cyanosis    The results of significant diagnostics from this hospitalization (including imaging, microbiology, ancillary and laboratory) are listed below for reference.     Microbiology: Recent Results (from the past 240 hour(s))  Culture, blood (Routine x 2)     Status: None   Collection Time: 04/19/17  1:05 AM  Result Value Ref Range Status   Specimen Description BLOOD RIGHT ANTECUBITAL  Final   Special Requests   Final    BOTTLES DRAWN AEROBIC AND ANAEROBIC Blood Culture results may not be optimal due to an inadequate volume of blood received in culture bottles   Culture   Final    NO GROWTH 5 DAYS Performed at [facility], [place] [address], [place], [place] [address]    Report Status 04/24/2017 FINAL  Final  Culture, blood (Routine x 2)     Status: None   Collection Time: 04/19/17  1:05 AM  Result Value Ref Range Status   Specimen Description BLOOD LEFT HAND  Final   Special Requests   Final    BOTTLES DRAWN AEROBIC AND ANAEROBIC Blood Culture adequate volume   Culture   Final    NO GROWTH 5 DAYS Performed at [facility], [place] [address] [place], [place] [address]    Report Status 04/24/2017 FINAL  Final  Urine culture     Status: Abnormal   Collection Time: 04/19/17  2:58 AM  Result Value Ref Range Status   Specimen Description URINE, CLEAN CATCH  Final   Special  Requests NONE  Final   Culture 60,000 COLONIES/mL YEAST (A)  Final   Report Status 04/20/2017 FINAL  Final  MRSA PCR Screening     Status: None   Collection Time: 04/22/17  8:53 AM  Result Value  Ref Range Status   MRSA by PCR NEGATIVE NEGATIVE Final    Comment:        The GeneXpert MRSA Assay (FDA approved for NASAL specimens only), is one component of a comprehensive MRSA colonization surveillance program. It is not intended to diagnose MRSA infection nor to guide or monitor treatment for MRSA infections.      Labs: BNP (last 3 results)  Recent Labs  04/20/17 2057  BNP 401.0*   Basic Metabolic Panel:  Recent Labs Lab 04/20/17 2057 04/21/17 1521 04/22/17 0550 04/23/17 0624 04/24/17 0544 04/25/17 0630  NA 140 145 142 144 141 142  K 2.6* 2.9* 3.2* 2.5* 2.3* 3.0*  CL 109 108 107 106 103 106  CO2 21* '26 24 26 27 24  ' GLUCOSE 81 125* 131* 104* 92 80  BUN '7 6 6 6 9 11  ' CREATININE 0.68 0.77 0.73 0.69 0.66 0.68  CALCIUM 11.5* 11.7* 11.8* 11.3* 11.3* 12.0*  MG 1.4*  --   --  1.6* 1.8 1.8   Liver Function Tests:  Recent Labs Lab 04/21/17 1521 04/22/17 0550 04/23/17 0624 04/24/17 0544  AST  --   --  29 26  ALT  --   --  9* 9*  ALKPHOS  --   --  73 69  BILITOT  --   --  0.7 0.6  PROT  --   --  7.6 7.4  ALBUMIN 2.0* 2.2* 2.1* 2.1*   No results for input(s): LIPASE, AMYLASE in the last 168 hours. No results for input(s): AMMONIA in the last 168 hours. CBC:  Recent Labs Lab 04/20/17 2057 04/21/17 1521 04/22/17 0550 04/23/17 0624  WBC  --  11.8* 14.4* 12.5*  NEUTROABS  --  9.5* 11.7* 10.0*  HGB 7.1* 11.4* 11.9* 11.2*  HCT 23.3* 33.6* 34.6* 32.9*  MCV  --  88.0 88.3 89.6  PLT  --  145* 163 157   Cardiac Enzymes: No results for input(s): CKTOTAL, CKMB, CKMBINDEX, TROPONINI in the last 168 hours. BNP: Invalid input(s): POCBNP CBG: No results for input(s): GLUCAP in the last 168 hours. D-Dimer No results for input(s): DDIMER in the last 72 hours. Hgb  A1c No results for input(s): HGBA1C in the last 72 hours. Lipid Profile No results for input(s): CHOL, HDL, LDLCALC, TRIG, CHOLHDL, LDLDIRECT in the last 72 hours. Thyroid function studies No results for input(s): TSH, T4TOTAL, T3FREE, THYROIDAB in the last 72 hours.  Invalid input(s): FREET3 Anemia work up No results for input(s): VITAMINB12, FOLATE, FERRITIN, TIBC, IRON, RETICCTPCT in the last 72 hours. Urinalysis    Component Value Date/Time   COLORURINE YELLOW 04/19/2017 0500   APPEARANCEUR CLOUDY (A) 04/19/2017 0500   LABSPEC 1.025 04/19/2017 0500   PHURINE 5.0 04/19/2017 0500   GLUCOSEU NEGATIVE 04/19/2017 0500   HGBUR SMALL (A) 04/19/2017 0500   BILIRUBINUR NEGATIVE 04/19/2017 0500   BILIRUBINUR neg 03/09/2015 1333   KETONESUR NEGATIVE 04/19/2017 0500   PROTEINUR 100 (A) 04/19/2017 0500   UROBILINOGEN 0.2 03/09/2015 1333   NITRITE NEGATIVE 04/19/2017 0500   LEUKOCYTESUR MODERATE (A) 04/19/2017 0500   Sepsis Labs Invalid input(s): PROCALCITONIN,  WBC,  LACTICIDVEN Microbiology Recent Results (from the past 240 hour(s))  Culture, blood (Routine x 2)     Status: None   Collection Time: 04/19/17  1:05 AM  Result Value Ref Range Status   Specimen Description BLOOD RIGHT ANTECUBITAL  Final   Special Requests   Final    BOTTLES DRAWN AEROBIC AND ANAEROBIC Blood Culture results  may not be optimal due to an inadequate volume of blood received in culture bottles   Culture   Final    NO GROWTH 5 DAYS Performed at [facility], [place] [address] [place], [place] [address]    Report Status 04/24/2017 FINAL  Final  Culture, blood (Routine x 2)     Status: None   Collection Time: 04/19/17  1:05 AM  Result Value Ref Range Status   Specimen Description BLOOD LEFT HAND  Final   Special Requests   Final    BOTTLES DRAWN AEROBIC AND ANAEROBIC Blood Culture adequate volume   Culture   Final    NO GROWTH 5 DAYS Performed at [facility], [place] [address], [place],  [place] [address]    Report Status 04/24/2017 FINAL  Final  Urine culture     Status: Abnormal   Collection Time: 04/19/17  2:58 AM  Result Value Ref Range Status   Specimen Description URINE, CLEAN CATCH  Final   Special Requests NONE  Final   Culture 60,000 COLONIES/mL YEAST (A)  Final   Report Status 04/20/2017 FINAL  Final  MRSA PCR Screening     Status: None   Collection Time: 04/22/17  8:53 AM  Result Value Ref Range Status   MRSA by PCR NEGATIVE NEGATIVE Final    Comment:        The GeneXpert MRSA Assay (FDA approved for NASAL specimens only), is one component of a comprehensive MRSA colonization surveillance program. It is not intended to diagnose MRSA infection nor to guide or monitor treatment for MRSA infections.      Time coordinating discharge: Over 30 minutes  SIGNED:   Charlynne Cousins, MD  [facility] 04/27/2017, 7:38 AM Pager   If 7PM-7AM, please contact night-coverage [email or web address] Password [email or web address]

## 2017-04-29 ENCOUNTER — Other Ambulatory Visit: Payer: Medicare Other

## 2017-04-29 ENCOUNTER — Other Ambulatory Visit: Payer: Self-pay | Admitting: *Deleted

## 2017-04-29 ENCOUNTER — Encounter: Payer: Medicare Other | Admitting: Nutrition

## 2017-04-29 ENCOUNTER — Ambulatory Visit: Payer: Medicare Other

## 2017-04-29 DIAGNOSIS — R7309 Other abnormal glucose: Secondary | ICD-10-CM | POA: Diagnosis not present

## 2017-04-29 DIAGNOSIS — T451X5D Adverse effect of antineoplastic and immunosuppressive drugs, subsequent encounter: Secondary | ICD-10-CM | POA: Diagnosis not present

## 2017-04-29 DIAGNOSIS — C9000 Multiple myeloma not having achieved remission: Secondary | ICD-10-CM | POA: Diagnosis not present

## 2017-04-29 DIAGNOSIS — J181 Lobar pneumonia, unspecified organism: Secondary | ICD-10-CM | POA: Diagnosis not present

## 2017-04-29 DIAGNOSIS — E119 Type 2 diabetes mellitus without complications: Secondary | ICD-10-CM | POA: Diagnosis not present

## 2017-04-29 DIAGNOSIS — G620 Drug-induced polyneuropathy: Secondary | ICD-10-CM | POA: Diagnosis not present

## 2017-04-30 ENCOUNTER — Other Ambulatory Visit: Payer: Medicare Other

## 2017-04-30 ENCOUNTER — Ambulatory Visit: Payer: Medicare Other

## 2017-04-30 ENCOUNTER — Emergency Department (HOSPITAL_COMMUNITY): Payer: Medicare Other

## 2017-04-30 ENCOUNTER — Telehealth: Payer: Self-pay

## 2017-04-30 ENCOUNTER — Inpatient Hospital Stay (HOSPITAL_COMMUNITY)
Admission: EM | Admit: 2017-04-30 | Discharge: 2017-05-02 | DRG: 871 | Disposition: A | Discharge status: Hospice | Payer: Medicare Other | Attending: Internal Medicine | Admitting: Internal Medicine

## 2017-04-30 ENCOUNTER — Encounter (HOSPITAL_COMMUNITY): Payer: Self-pay | Admitting: Family Medicine

## 2017-04-30 DIAGNOSIS — Z85038 Personal history of other malignant neoplasm of large intestine: Secondary | ICD-10-CM

## 2017-04-30 DIAGNOSIS — Z66 Do not resuscitate: Secondary | ICD-10-CM | POA: Diagnosis present

## 2017-04-30 DIAGNOSIS — R000 Tachycardia, unspecified: Secondary | ICD-10-CM | POA: Diagnosis not present

## 2017-04-30 DIAGNOSIS — E872 Acidosis: Secondary | ICD-10-CM | POA: Diagnosis present

## 2017-04-30 DIAGNOSIS — G63 Polyneuropathy in diseases classified elsewhere: Secondary | ICD-10-CM | POA: Diagnosis present

## 2017-04-30 DIAGNOSIS — J189 Pneumonia, unspecified organism: Secondary | ICD-10-CM | POA: Diagnosis not present

## 2017-04-30 DIAGNOSIS — E870 Hyperosmolality and hypernatremia: Secondary | ICD-10-CM | POA: Diagnosis present

## 2017-04-30 DIAGNOSIS — C9000 Multiple myeloma not having achieved remission: Secondary | ICD-10-CM | POA: Diagnosis not present

## 2017-04-30 DIAGNOSIS — Z9842 Cataract extraction status, left eye: Secondary | ICD-10-CM | POA: Diagnosis not present

## 2017-04-30 DIAGNOSIS — Z515 Encounter for palliative care: Secondary | ICD-10-CM | POA: Diagnosis not present

## 2017-04-30 DIAGNOSIS — F39 Unspecified mood [affective] disorder: Secondary | ICD-10-CM | POA: Diagnosis present

## 2017-04-30 DIAGNOSIS — D638 Anemia in other chronic diseases classified elsewhere: Secondary | ICD-10-CM | POA: Diagnosis not present

## 2017-04-30 DIAGNOSIS — E119 Type 2 diabetes mellitus without complications: Secondary | ICD-10-CM | POA: Diagnosis present

## 2017-04-30 DIAGNOSIS — E1365 Other specified diabetes mellitus with hyperglycemia: Secondary | ICD-10-CM | POA: Diagnosis not present

## 2017-04-30 DIAGNOSIS — F339 Major depressive disorder, recurrent, unspecified: Secondary | ICD-10-CM | POA: Diagnosis not present

## 2017-04-30 DIAGNOSIS — H409 Unspecified glaucoma: Secondary | ICD-10-CM | POA: Diagnosis present

## 2017-04-30 DIAGNOSIS — Z7401 Bed confinement status: Secondary | ICD-10-CM

## 2017-04-30 DIAGNOSIS — I10 Essential (primary) hypertension: Secondary | ICD-10-CM | POA: Diagnosis present

## 2017-04-30 DIAGNOSIS — Z79899 Other long term (current) drug therapy: Secondary | ICD-10-CM | POA: Diagnosis not present

## 2017-04-30 DIAGNOSIS — A419 Sepsis, unspecified organism: Principal | ICD-10-CM | POA: Diagnosis present

## 2017-04-30 DIAGNOSIS — Y95 Nosocomial condition: Secondary | ICD-10-CM | POA: Diagnosis present

## 2017-04-30 DIAGNOSIS — Z7982 Long term (current) use of aspirin: Secondary | ICD-10-CM

## 2017-04-30 DIAGNOSIS — K219 Gastro-esophageal reflux disease without esophagitis: Secondary | ICD-10-CM | POA: Diagnosis present

## 2017-04-30 DIAGNOSIS — J9811 Atelectasis: Secondary | ICD-10-CM | POA: Diagnosis present

## 2017-04-30 DIAGNOSIS — G8929 Other chronic pain: Secondary | ICD-10-CM | POA: Diagnosis present

## 2017-04-30 DIAGNOSIS — Z961 Presence of intraocular lens: Secondary | ICD-10-CM | POA: Diagnosis present

## 2017-04-30 DIAGNOSIS — Z9049 Acquired absence of other specified parts of digestive tract: Secondary | ICD-10-CM

## 2017-04-30 DIAGNOSIS — K5649 Other impaction of intestine: Secondary | ICD-10-CM | POA: Diagnosis not present

## 2017-04-30 DIAGNOSIS — Z9484 Stem cells transplant status: Secondary | ICD-10-CM

## 2017-04-30 DIAGNOSIS — E860 Dehydration: Secondary | ICD-10-CM | POA: Diagnosis present

## 2017-04-30 DIAGNOSIS — G4700 Insomnia, unspecified: Secondary | ICD-10-CM | POA: Diagnosis present

## 2017-04-30 DIAGNOSIS — R531 Weakness: Secondary | ICD-10-CM | POA: Diagnosis not present

## 2017-04-30 DIAGNOSIS — C801 Malignant (primary) neoplasm, unspecified: Secondary | ICD-10-CM | POA: Diagnosis not present

## 2017-04-30 DIAGNOSIS — J301 Allergic rhinitis due to pollen: Secondary | ICD-10-CM | POA: Diagnosis not present

## 2017-04-30 DIAGNOSIS — E876 Hypokalemia: Secondary | ICD-10-CM | POA: Diagnosis present

## 2017-04-30 LAB — CBC WITH DIFFERENTIAL/PLATELET
BASOS ABS: 0 10*3/uL (ref 0.0–0.1)
Basophils Relative: 0 %
EOS ABS: 0 10*3/uL (ref 0.0–0.7)
Eosinophils Relative: 0 %
HCT: 34.6 % — ABNORMAL LOW (ref 36.0–46.0)
HEMOGLOBIN: 11 g/dL — AB (ref 12.0–15.0)
LYMPHS PCT: 8 %
Lymphs Abs: 1.2 10*3/uL (ref 0.7–4.0)
MCH: 30.3 pg (ref 26.0–34.0)
MCHC: 31.8 g/dL (ref 30.0–36.0)
MCV: 95.3 fL (ref 78.0–100.0)
Monocytes Absolute: 0.9 10*3/uL (ref 0.1–1.0)
Monocytes Relative: 6 %
NEUTROS ABS: 12.6 10*3/uL — AB (ref 1.7–7.7)
NEUTROS PCT: 86 %
Platelets: 146 10*3/uL — ABNORMAL LOW (ref 150–400)
RBC: 3.63 MIL/uL — ABNORMAL LOW (ref 3.87–5.11)
RDW: 18.8 % — ABNORMAL HIGH (ref 11.5–15.5)
WBC: 14.7 10*3/uL — ABNORMAL HIGH (ref 4.0–10.5)

## 2017-04-30 LAB — COMPREHENSIVE METABOLIC PANEL
ALT: 13 U/L — AB (ref 14–54)
AST: 31 U/L (ref 15–41)
Albumin: 2.4 g/dL — ABNORMAL LOW (ref 3.5–5.0)
Alkaline Phosphatase: 63 U/L (ref 38–126)
Anion gap: 11 (ref 5–15)
BUN: 10 mg/dL (ref 6–20)
CHLORIDE: 108 mmol/L (ref 101–111)
CO2: 23 mmol/L (ref 22–32)
CREATININE: 0.68 mg/dL (ref 0.44–1.00)
Calcium: 12.9 mg/dL — ABNORMAL HIGH (ref 8.9–10.3)
GFR calc Af Amer: >60 mL/min (ref >60)
GLUCOSE: 153 mg/dL — AB (ref 65–99)
Potassium: 3.7 mmol/L (ref 3.5–5.1)
SODIUM: 142 mmol/L (ref 135–145)
Total Bilirubin: 0.6 mg/dL (ref 0.3–1.2)
Total Protein: 8.5 g/dL — ABNORMAL HIGH (ref 6.5–8.1)

## 2017-04-30 LAB — LACTIC ACID, PLASMA: LACTIC ACID, VENOUS: 2.1 mmol/L — AB (ref 0.5–1.9)

## 2017-04-30 LAB — PROTIME-INR
INR: 1.31
Prothrombin Time: 16.4 seconds — ABNORMAL HIGH (ref 11.4–15.2)

## 2017-04-30 LAB — I-STAT TROPONIN, ED: Troponin i, poc: 0 ng/mL (ref 0.00–0.08)

## 2017-04-30 LAB — I-STAT CG4 LACTIC ACID, ED: LACTIC ACID, VENOUS: 4.52 mmol/L — AB (ref 0.5–1.9)

## 2017-04-30 LAB — MAGNESIUM: MAGNESIUM: 1.7 mg/dL (ref 1.7–2.4)

## 2017-04-30 LAB — PROCALCITONIN: PROCALCITONIN: 1.11 ng/mL

## 2017-04-30 LAB — APTT: aPTT: 35 seconds (ref 24–36)

## 2017-04-30 MED ORDER — SODIUM CHLORIDE 0.9 % IV BOLUS (SEPSIS): 1000.0000 mL | Freq: Once | INTRAVENOUS | Status: DC

## 2017-04-30 MED ORDER — DEXTROSE 5 % IV SOLN
1.0000 g | Freq: Three times a day (TID) | INTRAVENOUS | Status: DC
  Administered 2017-05-01 (×2): 1 g via INTRAVENOUS
  Filled 2017-04-30 (×3): qty 1

## 2017-04-30 MED ORDER — SODIUM CHLORIDE 0.9 % IV BOLUS (SEPSIS)
500.0000 mL | Freq: Once | INTRAVENOUS | Status: AC
  Administered 2017-05-01: 500 mL via INTRAVENOUS

## 2017-04-30 MED ORDER — PANTOPRAZOLE SODIUM 40 MG PO TBEC
40.0000 mg | DELAYED_RELEASE_TABLET | Freq: Every day | ORAL | Status: DC
  Administered 2017-05-01 – 2017-05-02 (×2): 40 mg via ORAL
  Filled 2017-04-30 (×2): qty 1

## 2017-04-30 MED ORDER — IOPAMIDOL (ISOVUE-370) INJECTION 76%
INTRAVENOUS | Status: AC
  Administered 2017-04-30: 100 mL via INTRAVENOUS
  Filled 2017-04-30: qty 100

## 2017-04-30 MED ORDER — LORATADINE 10 MG PO TABS
10.0000 mg | ORAL_TABLET | Freq: Every day | ORAL | Status: DC
  Administered 2017-05-01 – 2017-05-02 (×2): 10 mg via ORAL
  Filled 2017-04-30 (×2): qty 1

## 2017-04-30 MED ORDER — ADULT MULTIVITAMIN W/MINERALS CH
1.0000 | ORAL_TABLET | Freq: Every day | ORAL | Status: DC
  Administered 2017-05-01 – 2017-05-02 (×2): 1 via ORAL
  Filled 2017-04-30 (×2): qty 1

## 2017-04-30 MED ORDER — PIPERACILLIN-TAZOBACTAM 3.375 G IVPB 30 MIN
3.3750 g | Freq: Once | INTRAVENOUS | Status: AC
  Administered 2017-04-30: 3.375 g via INTRAVENOUS
  Filled 2017-04-30: qty 50

## 2017-04-30 MED ORDER — OXYCODONE-ACETAMINOPHEN 5-325 MG PO TABS
1.0000 | ORAL_TABLET | ORAL | Status: DC | PRN
  Administered 2017-04-30 – 2017-05-02 (×3): 1 via ORAL
  Filled 2017-04-30 (×4): qty 1

## 2017-04-30 MED ORDER — LATANOPROST 0.005 % OP SOLN
1.0000 [drp] | Freq: Every day | OPHTHALMIC | Status: DC
  Administered 2017-04-30 – 2017-05-01 (×2): 1 [drp] via OPHTHALMIC
  Filled 2017-04-30: qty 2.5

## 2017-04-30 MED ORDER — FENTANYL CITRATE (PF) 100 MCG/2ML IJ SOLN
25.0000 ug | Freq: Once | INTRAMUSCULAR | Status: AC
  Administered 2017-04-30: 25 ug via INTRAVENOUS
  Filled 2017-04-30: qty 2

## 2017-04-30 MED ORDER — SODIUM CHLORIDE 0.9 % IV BOLUS (SEPSIS)
1800.0000 mL | Freq: Once | INTRAVENOUS | Status: AC
  Administered 2017-04-30: 1800 mL via INTRAVENOUS

## 2017-04-30 MED ORDER — ENOXAPARIN SODIUM 40 MG/0.4ML ~~LOC~~ SOLN
40.0000 mg | Freq: Every day | SUBCUTANEOUS | Status: DC
  Administered 2017-04-30 – 2017-05-01 (×2): 40 mg via SUBCUTANEOUS
  Filled 2017-04-30 (×2): qty 0.4

## 2017-04-30 MED ORDER — DORZOLAMIDE HCL-TIMOLOL MAL 2-0.5 % OP SOLN
1.0000 [drp] | Freq: Two times a day (BID) | OPHTHALMIC | Status: DC
  Administered 2017-04-30 – 2017-05-02 (×4): 1 [drp] via OPHTHALMIC
  Filled 2017-04-30: qty 10

## 2017-04-30 MED ORDER — ASPIRIN EC 81 MG PO TBEC
81.0000 mg | DELAYED_RELEASE_TABLET | Freq: Every day | ORAL | Status: DC
  Administered 2017-05-01 – 2017-05-02 (×2): 81 mg via ORAL
  Filled 2017-04-30 (×2): qty 1

## 2017-04-30 MED ORDER — SODIUM CHLORIDE 0.9 % IV BOLUS (SEPSIS)
500.0000 mL | Freq: Once | INTRAVENOUS | Status: AC
  Administered 2017-04-30: 500 mL via INTRAVENOUS

## 2017-04-30 MED ORDER — TEMAZEPAM 15 MG PO CAPS
15.0000 mg | ORAL_CAPSULE | Freq: Every evening | ORAL | Status: DC | PRN
  Administered 2017-05-01: 15 mg via ORAL
  Filled 2017-04-30: qty 1

## 2017-04-30 MED ORDER — OXYCODONE HCL ER 15 MG PO T12A
15.0000 mg | EXTENDED_RELEASE_TABLET | Freq: Two times a day (BID) | ORAL | Status: DC
  Administered 2017-05-01 – 2017-05-02 (×2): 15 mg via ORAL
  Filled 2017-04-30 (×2): qty 1

## 2017-04-30 MED ORDER — VANCOMYCIN HCL IN DEXTROSE 1-5 GM/200ML-% IV SOLN
1000.0000 mg | Freq: Two times a day (BID) | INTRAVENOUS | Status: DC
  Administered 2017-05-01: 1000 mg via INTRAVENOUS
  Filled 2017-04-30: qty 200

## 2017-04-30 MED ORDER — LATANOPROST 0.005 % OP SOLN: 1.0000 [drp] | Freq: Every day | OPHTHALMIC | Status: DC

## 2017-04-30 MED ORDER — GABAPENTIN 100 MG PO CAPS
100.0000 mg | ORAL_CAPSULE | Freq: Every day | ORAL | Status: DC
  Administered 2017-05-01 – 2017-05-02 (×2): 100 mg via ORAL
  Filled 2017-04-30 (×2): qty 1

## 2017-04-30 MED ORDER — SODIUM CHLORIDE 0.9 % IV SOLN
INTRAVENOUS | Status: DC
  Administered 2017-04-30: via INTRAVENOUS

## 2017-04-30 MED ORDER — MIRTAZAPINE 15 MG PO TABS
30.0000 mg | ORAL_TABLET | Freq: Every day | ORAL | Status: DC
  Administered 2017-04-30 – 2017-05-01 (×2): 30 mg via ORAL
  Filled 2017-04-30 (×2): qty 1

## 2017-04-30 MED ORDER — PREDNISONE 20 MG PO TABS
20.0000 mg | ORAL_TABLET | Freq: Every day | ORAL | Status: DC
  Administered 2017-05-01 – 2017-05-02 (×2): 20 mg via ORAL
  Filled 2017-04-30 (×2): qty 1

## 2017-04-30 MED ORDER — VANCOMYCIN HCL IN DEXTROSE 1-5 GM/200ML-% IV SOLN
1000.0000 mg | Freq: Once | INTRAVENOUS | Status: AC
  Administered 2017-04-30: 1000 mg via INTRAVENOUS
  Filled 2017-04-30: qty 200

## 2017-04-30 MED ORDER — FUROSEMIDE 10 MG/ML IJ SOLN
40.0000 mg | Freq: Two times a day (BID) | INTRAMUSCULAR | Status: DC
  Administered 2017-04-30: 40 mg via INTRAVENOUS
  Filled 2017-04-30: qty 4

## 2017-04-30 MED ORDER — POTASSIUM CHLORIDE CRYS ER 20 MEQ PO TBCR
20.0000 meq | EXTENDED_RELEASE_TABLET | Freq: Two times a day (BID) | ORAL | Status: DC
  Administered 2017-04-30: 20 meq via ORAL
  Filled 2017-04-30: qty 1

## 2017-04-30 NOTE — Telephone Encounter (Signed)
Clarise Cruz RN with [facility] called in a critical calcium level 13.3. Drawn 04/29/17 at home and run at [facility]  She does not have the rest of the values at this time. Call Clarise Cruz back at [phone].

## 2017-04-30 NOTE — Progress Notes (Signed)
Pharmacy Antibiotic Note  Darlene Howard is a 66 y.o. female admitted on 04/30/2017 with pneumonia.  PMH significant for lung cancer and multiple myeloma.  Recent admission for pneumonia.  Recent labwork revealed elevated calcium and pt directed to come to the ED.  CTAngio shows persistent pneumonia.  In the ED patient received Vancomycin 1gm and Zosyn 3.375gm IV x 1 dose each.  Pharmacy has been consulted for Vancomycin and Cefepime dosing.  Plan:  Vancomycin 1gm IV q12h  Cefepime 1gm IV q8h  Follow renal function  Follow cultures/sensitivities  Height: '5\' 6"'  (539.6 cm) Weight: 172 lb (78 kg) IBW/kg (Calculated) : 59.3  Temp (24hrs), Avg:98.3 F (36.8 C), Min:98.2 F (36.8 C), Max:98.3 F (36.8 C)   Recent Labs Lab 04/24/17 0544 04/25/17 0630 04/30/17 1702 04/30/17 2057  WBC  --   --  14.7*  --   CREATININE 0.66 0.68 0.68  --   LATICACIDVEN  --   --   --  4.52*    Estimated Creatinine Clearance: 72 mL/min (by C-G formula based on SCr of 0.68 mg/dL).    Allergies  Allergen Reactions  . Codeine Nausea And Vomiting  . Tramadol Nausea And Vomiting    Antimicrobials this admission: 5/1 zosyn x 1 dose  5/1 vanc >>   5/2 cefepime >>  Dose adjustments this admission:    Microbiology results: 5/1 BCx: sent  Thank you for allowing pharmacy to be a part of this patient's care.  Everette Rank, PharmD 04/30/2017 10:16 PM

## 2017-04-30 NOTE — H&P (Signed)
History and Physical    [name] VFI:[id number] DOB: [birth year] DOA: 04/30/2017  PCP: [name], MD   Patient coming from: Home  Chief Complaint: Advised to report to the ED for management of recurrent hypercalcemia of malignancy.  HPI: Darlene Howard is a 66 y.o. woman with a history of multiple myeloma (Followed by Dr. Earlie Server but she has also been seen at [facility]), colon cancer S/P right hemicolectomy, pre-diabetes, HTN, GERD, AOCD (malignancy), and hypercalcemia of malignancy who presents to the ED after being notified that her calcium level was 13 and required treatment.  She had a recent admission for sepsis secondary to HCAP from 4/19 - 4/28.  During that time, she also had hypercalcemia, and she received Zometa on 4/25.  She reports increased weakness and muscle aches.  Family says that she has had increased lethargy and has been sleeping more than usual.  On Sunday, she had self-limited slurred speech, but family also reports that the patient engaged many visitors on Saturday which likely contributed to fatigue on Sunday morning.  No cough.  No shortness of breath.  No sputum production.  She has been taking her oral levaquin as prescribed at discharge.  No confusion, disorientation, syncope.  No LUTS.  No fever, but she has been diaphoretic.  No chills.  No nausea or vomiting.  No headache, vision changes, or focal weakness.  She is bed-ridden at baseline.  ED Course: She was afebrile but tachycardic to the 120's in the ED. Systolic BP 416'S.  WBC count 14.7, up again slightly from her discharge.  Lactic acid was checked, which is elevated at 4.52.  Calcium 12.9.  Albumin 2.4.  Negative troponin.    Chest xray shows chronic changes.  CTA chest was ordered to rule out PE.  She continues to have a right perihilar infiltrate with air bronchograms, consistent with pneumonia.  Progressive metastatic disease and bilateral effusions are also noted.    The patient received NS 30cc/kg  plus IV vanc and zosyn.  Blood cultures sent.  Hospitalist asked to admit.  Review of Systems: As per HPI otherwise 10 systems reviewed and negative.   Past Medical History:  Diagnosis Date  . Bruises easily    d/t being on COumadin  . Cholelithiasis   . DDD (degenerative disc disease), lumbar   . Dehydration 03/11/2017  . Fatty liver   . GERD (gastroesophageal reflux disease)    takes Pantoprazole daily  . Glaucoma    uses eye drops as instructed  . H/O stem cell transplant ([place])    04-26-2006  AT [facility]  . Herpes    takes Valtrex as needed  . History of colon polyps   . Hypercalcemia of malignancy 03/11/2017  . Hypertension    takes Amlodipine and Diovan daily  . Left radial fracture 01/21/2017   chest wall lesion aadn 6th rib  . Multiple myeloma DX  2006  STATE IIA,  IgA  KAPPA   STABLE  PER DR MOHAMED--  S/P STEM CELL TRANSPLANT 2007 /  RECURRENCE 2008  CHEMO ENDED 03-30-2008/   NOW ON MAINTANANCE REVLIMID )  . 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 Personal history of colon cancer    ASCENDING COLON--  S/P  RIGHT HEMICOLECTOMY WITH NEGATIVE NODES/    NO RECURRENCE  . Vocal cord nodule    resolved- see Dr. Deeann Saint notes in media tab 06/17/14    Past Surgical History:  Procedure Laterality Date  . ABDOMINAL HYSTERECTOMY  1983   w/  appendectomy and left salpingoophorectom  . CATARACT EXTRACTION W/PHACO Left 01/20/2014   Procedure: CATARACT EXTRACTION PHACO AND INTRAOCULAR LENS PLACEMENT (IOC);  Surgeon: Adonis Brook, MD;  Location: [facility];  Service: Ophthalmology;  Laterality: Left;  . CERVICAL CONE BIOPSY    . CHOLECYSTECTOMY N/A 02/28/2016   Procedure: LAPAROSCOPIC CHOLECYSTECTOMY;  Surgeon: Ralene Ok, MD;  Location: [facility];  Service: General;  Laterality: N/A;  . CO2  ABLATION VULVA PERIANAL AND VAGINAL DYSPLATIC AREAS  02-15-2010  . COLONOSCOPY  10/07/2006   XNA:TFTDDU rectum/Status post right hemicolectomy.  Normal residual colon  . COLONOSCOPY  10/2009   RMR: diminutive RS polyp (hyperplastic).   . COLONOSCOPY N/A 12/15/2014   Procedure: COLONOSCOPY;  Surgeon: Daneil Dolin, MD;  [facility];  Service: Endoscopy;  Laterality: N/A;  130   . ESOPHAGOGASTRODUODENOSCOPY  04/02/2005   KGU:RKYHCW/CBJSEGBTDVVO polyp with central erosion versus ulceration in the body of the stomach, resected and recovered Remainder of the gastric mucosa, D1, D2 appeared normal  . HEMICOLECTOMY Right 04-27-2005   CARCINOMA   ASCENDING COLON  . LESION REMOVAL N/A 05/06/2014   Procedure: LASER OF THE VAGINA;  Surgeon: Janie Morning, MD;  Location: [facility];  Service: Gynecology;  Laterality: N/A;  . LIMBAL STEM CELL TRANSPLANT  04-26-2006    ([facility])  . VULVAR LESION REMOVAL N/A 05/06/2014   Procedure: LASER OF VULVAR LESION;  Surgeon: Janie Morning, MD;  Location: [facility];  Service: Gynecology;  Laterality: N/A;  . VULVECTOMY N/A 05/06/2014   Procedure: WIDE LOCAL EXCISION LEFT LABIA MAJORA;  Surgeon: Janie Morning, MD;  Location: [facility];  Service: Gynecology;  Laterality: N/A;     reports that she has never smoked. She has never used smokeless tobacco. She reports that she does not drink alcohol or use drugs.  She lives with her two sisters.  She is essentially bed-bound at home.  She has a hospital bed.  She is not married; she does not have adult children.  She does not have a designated medical power of attorney.  Allergies  Allergen Reactions  . Codeine Nausea And Vomiting  . Tramadol Nausea And Vomiting    Family History  Problem Relation Age of Onset  . Kidney failure Mother   . Hypertension Mother   . Prostate cancer Father 103  . Hypertension Father     vascular disease   . Stroke Father   . Hypertension Sister   . Hyperlipidemia Sister   . Hypertension Brother   . Hyperlipidemia Brother   . Hypertension Sister   . Hypertension  Sister   . Colon cancer Neg Hx   . Breast cancer Neg Hx      Prior to Admission medications   Medication Sig Start Date End Date Taking? Authorizing Provider  aspirin EC 81 MG tablet Take 81 mg by mouth daily.    Yes Historical Provider, MD  dexamethasone (DECADRON) 4 MG tablet TAKE 10 TABS ONCE WEEKLY  WHILE UNDERGOING VELCADE  INJECTIONS. Patient taking differently: Take 10 tablets by mouth every Monday while undergoing Velcade injections 01/29/17  Yes Curt Bears, MD  dorzolamide-timolol (COSOPT) 22.3-6.8 MG/ML ophthalmic solution Place 1 drop into the left eye 2 (two) times daily.    Yes Historical Provider, MD  gabapentin (NEURONTIN) 100 MG capsule Take 100 mg by mouth daily.   Yes Historical Provider, MD  latanoprost (XALATAN) 0.005 % ophthalmic  solution Place 1 drop into both eyes at bedtime. 04/15/17  Yes Hosie Poisson, MD  levofloxacin (LEVAQUIN) 500 MG tablet Take 1 tablet (500 mg total) by mouth daily. 04/25/17  Yes Charlynne Cousins, MD  loperamide (IMODIUM) 2 MG capsule Take 2-4 mg by mouth every 2 (two) hours as needed for diarrhea or loose stools.   Yes Historical Provider, MD  loratadine (CLARITIN) 10 MG tablet TAKE ONE TABLET BY MOUTH ONCE DAILY. 04/02/17  Yes Fayrene Helper, MD  LUMIGAN 0.01 % SOLN Place 1 drop into both eyes at bedtime.    Yes Historical Provider, MD  mirtazapine (REMERON) 30 MG tablet Take 1 tablet (30 mg total) by mouth at bedtime. 04/17/17  Yes Curt Bears, MD  Multiple Vitamin (MULTIVITAMIN WITH MINERALS) TABS tablet Take 1 tablet by mouth daily.   Yes Historical Provider, MD  oxyCODONE (OXYCONTIN) 15 mg 12 hr tablet Take 1 tablet (15 mg total) by mouth every 12 (twelve) hours. 04/25/17  Yes Charlynne Cousins, MD  oxyCODONE-acetaminophen (PERCOCET/ROXICET) 5-325 MG tablet Take 1 tablet by mouth every 4 (four) hours as needed for severe pain. May take one tablet every 4-6 hours as needed for pain 04/17/17  Yes Curt Bears, MD  pantoprazole  (PROTONIX) 40 MG tablet TAKE 1 TABLET BY MOUTH  DAILY FOR ACID REFLUX 10/18/16  Yes Fayrene Helper, MD  potassium chloride SA (K-DUR,KLOR-CON) 20 MEQ tablet Take 1 tablet (20 mEq total) by mouth 2 (two) times daily. 04/25/17  Yes Charlynne Cousins, MD  predniSONE (DELTASONE) 20 MG tablet Take 1 tablet (20 mg total) by mouth daily with breakfast. 04/26/17  Yes Charlynne Cousins, MD  temazepam (RESTORIL) 15 MG capsule TAKE 1 CAPSULE AT BEDTIME AS NEEDED FOR SLEEP. 08/23/16  Yes Fayrene Helper, MD  acetaminophen (TYLENOL) 500 MG tablet Take 500 mg by mouth every 6 (six) hours as needed for mild pain, moderate pain, fever or headache.     Historical Provider, MD  pomalidomide (POMALYST) 4 MG capsule Take 1 capsule (4 mg total) by mouth daily. Take with water on days 1-21. Repeat every 28 days. 04/17/17   Curt Bears, MD  valACYclovir (VALTREX) 1000 MG tablet TAKE 1 TABLET BY MOUTH  DAILY AS NEEDED Patient taking differently: Take 1 tablet by mouth daily as needed for cold sores 03/19/17   Fayrene Helper, MD    Physical Exam: Vitals:   04/30/17 1935 04/30/17 1945 04/30/17 2000 04/30/17 2100  BP:   (!) 144/80 (!) 141/73  Pulse:   (!) 122 (!) 121  Resp:   20 20  Temp:  98.3 F (36.8 C)    TempSrc:      SpO2: 95%  94% 93%  Weight:      Height:          Constitutional: NAD, calm, chronically ill appearing Vitals:   04/30/17 1935 04/30/17 1945 04/30/17 2000 04/30/17 2100  BP:   (!) 144/80 (!) 141/73  Pulse:   (!) 122 (!) 121  Resp:   20 20  Temp:  98.3 F (36.8 C)    TempSrc:      SpO2: 95%  94% 93%  Weight:      Height:       Eyes: PERRL, lids and conjunctivae normal ENMT: Mucous membranes are moist. Posterior pharynx clear of any exudate or lesions. Normal dentition.  Neck: normal appearance, supple, no masses Respiratory: Diminished bilaterally listening anteriorly.  Normal respiratory effort. No accessory muscle use.  Cardiovascular:  Tachycardic but regular.  No  murmurs / rubs / gallops. No significant pitting edema in bilateral lower extremities.  2+ pedal pulses. GI: abdomen is soft and compressible.  No distention.  No tenderness.  Bowel sounds are present. Musculoskeletal:  No joint deformity in upper and lower extremities.  No contractures.  Moves all four extremities spontaneously.   Skin: Warm and dry.  No sacral breakdown.  Several hyperpigmented lesion present. Neurologic: No focal deficits.  Generalized weakness.    Psychiatric: Normal judgment and insight. Alert and oriented x 3. Flat affect.    Labs on Admission: I have personally reviewed following labs and imaging studies  CBC:  Recent Labs Lab 04/30/17 1702  WBC 14.7*  NEUTROABS 12.6*  HGB 11.0*  HCT 34.6*  MCV 95.3  PLT 854*   Basic Metabolic Panel:  Recent Labs Lab 04/24/17 0544 04/25/17 0630 04/30/17 1702  NA 141 142 142  K 2.3* 3.0* 3.7  CL 103 106 108  CO2 _0 GLUCOSE 92 80 153*  BUN _1 CREATININE 0.66 0.68 0.68  CALCIUM 11.3* 12.0* 12.9*  MG 1.8 1.8 1.7   GFR: Estimated Creatinine Clearance: 72 mL/min (by C-G formula based on SCr of 0.68 mg/dL). Liver Function Tests:  Recent Labs Lab 04/24/17 0544 04/30/17 1702  AST 26 31  ALT 9* 13*  ALKPHOS 69 63  BILITOT 0.6 0.6  PROT 7.4 8.5*  ALBUMIN 2.1* 2.4*   Urine analysis: Pending  Sepsis Labs:  Lactic acid level 4.52  Radiological Exams on Admission: Ct Angio Chest Pe W And/or Wo Contrast  Result Date: 04/30/2017 CLINICAL DATA:  Weakness. Abnormal blood calcium. History of multiple myeloma. EXAM: CT ANGIOGRAPHY CHEST WITH CONTRAST TECHNIQUE: Multidetector CT imaging of the chest was performed using the standard protocol during bolus administration of intravenous contrast. Multiplanar CT image reconstructions and MIPs were obtained to evaluate the vascular anatomy. CONTRAST:  100 mL of Isovue 370 COMPARISON:  Chest CT April 25, 2016.  Multiple recent chest x-rays. FINDINGS:  Cardiovascular: The heart is unchanged. The thoracic aorta is normal in caliber. No dissection. The main pulmonary artery measures up to 3.8 cm, mildly enlarged but similar in the interval. Evaluation of pulmonary arteries is limited due to respiratory motion and right-sided pulmonary opacities. Within these limitations, no pulmonary emboli are identified. Mediastinum/Nodes: No axillary adenopathy. There is adenopathy in the base of the neck bilaterally which is new. A representative node on series 4, image 1 measures 2.2 cm in diameter an crosses the thoracic inlet. Mediastinal adenopathy is also worse in the interval with nodes in the prevascular space, left paratracheal region, and in the left periaortic region with numerous nodes adjacent to the descending thoracic aorta. At least 1 abnormal node is seen between the esophagus and descending aorta. Retrocrural nodes are identified, also enlarged. The thyroid is normal. The esophagus itself is unremarkable. The pleural effusions. No significant pericardial effusion. Lungs/Pleura: A nodule in the right upper lobe on series 7, image 24 measures 7 mm today versus 5 mm previously. No other pulmonary nodules. Focal opacity centered in the right perihilar region with air bronchograms is identified. Moderate effusions bilaterally with associated atelectasis. Mild pleural thickening not excluded laterally on the right on series 7, image 46 versus pleural fluid. Upper Abdomen: There appears to be a rounded low-attenuation lesion in the upper liver measuring up to 2.5 cm. This was not a 61 seen previously. Another low-attenuation lesion is seen in the right hepatic lobe  on image 74 and a third lesion may be seen in the left hepatic lobe on image 73. Thickening of the left adrenal gland is new and suspicious. A tiny nodule in the right adrenal gland is stable. No other abnormalities in the upper abdomen. Musculoskeletal: The previously identified right posterior sixth rib  metastasis has been replaced by a bony sclerosis. Numerous active bony metastases are seen however. 2 destructive metastases are seen in the right fourth rib. Metastases however are seen in multiple ribs. Bony destruction is seen of the right posterior ninth rib with a soft tissue nodule. Active bony destruction by soft tissue nodules seen on lateral left rib on image 42. Another is seen on image 56. Bony destruction is seen in the anterior tenth thoracic vertebral body. A rim of tissue is seen at the right lateral aspects of T9 and T10 consistent with extraosseous extension of bony metastases. A metastasis is seen at the superior endplate of L1. Clavicular lesion are seen on the left. Left-sided scapular lesions are identified. A destructive lesion is seen in the ninth thoracic vertebral body right transverse process. Review of the MIP images confirms the above findings. IMPRESSION: 1. No pulmonary emboli identified. 2. Focal right perihilar infiltrate with air bronchograms. Unless the patient has had radiation therapy to the chest, these findings are most consistent with pneumonia. Recommend follow-up to resolution. 3. Worsening metastatic disease in the chest. A nodule in the right upper lobe is larger in the interval. Adenopathy in the base of the neck in the mediastinum has significantly worsened as well. While the largest metastatic lesion to the right posterior sixth rib and has improved, the remainder of the bony and involvement has worsened. 4. Three low-attenuation lesions in the liver worrisome for metastatic disease. 5. New nodularity in the left adrenal gland is suggestive of metastatic disease as well. 6. New bilateral moderate pleural effusions. Electronically Signed   By: Dorise Bullion III M.D   On: 04/30/2017 19:58   Dg Chest Port 1 View  Result Date: 04/30/2017 CLINICAL DATA:  Weakness. EXAM: PORTABLE CHEST 1 VIEW COMPARISON:  04/19/2017 as well as chest CT 04/25/2016 FINDINGS: Lungs are  adequately inflated demonstrate persistent right perihilar opacification with no change to slight improvement likely due in part to right posterior chest wall myeloma. Left lung is clear. Cardiac silhouette is within normal. Left lateral rib fractures unchanged likely pathologic in may be related to patient's known myeloma. Remainder of the exam is unchanged. IMPRESSION: No acute findings. Stable opacification of the right perihilar region which may be due in part to known right posterior chest wall myeloma. Stable left lateral rib fractures likely pathologic due to known myeloma. Electronically Signed   By: Marin Olp M.D.   On: 04/30/2017 18:02    EKG: Independently reviewed. Sinus tachycardia.  No acute ST segment changes.  Assessment/Plan Principal Problem:   Sepsis ([place]) Active Problems:   Essential hypertension   Multiple myeloma not having achieved remission (HCC)   Anemia of chronic disease   Hypercalcemia of malignancy   HCAP (healthcare-associated pneumonia)   Sinus tachycardia      Sepsis (leukocytosis, tachycardia, elevated lactic acid) presumably secondary to persistent HCAP.  Urine has not been collected as ordered.  Blood cultures pending.   --Agree with resuming IV antibiotics for now.  Will cover with vancomycin and cefepime for HCAP. --U/A and urine culture requested  Hypercalcemia of malignancy --Maintenance fluids continued due to hypercalcemia; rate reduced due to persistent HTN --IV lasix  TID, continue potassium supplement --She just had Zometa within the past week; will need to discuss with Dr. Earlie Server in the AM; defer timing of next dose to oncology --Consider adding calcitonin (she required it during her last admission as well) --She is on oral prednisone. --I suspect this will be a recurring problem due to the severity of her MM  Multiple myeloma --At this point, the patient still expects to receive treatment if she can be cleared of her infectious  issues.  Defer to Dr. Earlie Server and specialists at [facility].  Accelerated HTN, likely driven by IV fluids and steroids.  Per family, anti-hypertensives actually discontinued 1-2 months ago for relative hypotension. --IV labetalol prn with parameters  Chronic pain --Oxycontin BID, percocet for breakthrough pain, neurontin  Glaucoma --Multiple eyedrops  Mood disorder --Remeron  Insomnia --Restoril   DVT prophylaxis: Lovenox Code Status: DNR Family Communication: Brother and sister-in-law present in the ED at time of admission. Disposition Plan: To be determined. Consults called: NONE.  Will need to notify Dr. [name] of admission in the AM. Admission status: Inpatient, stepdown unit.  I expect this patient will need inpatient services for greater than two midnights.   TIME SPENT: 60 minutes   Eber Jones MD [facility] Pager [phone]  If 7PM-7AM, please contact night-coverage [email or web address] Password [email or web address]  04/30/2017, 10:06 PM

## 2017-04-30 NOTE — ED Provider Notes (Signed)
[place] DEPT Provider Note   CSN: [id number] Arrival date & time: 04/30/17  1549     History   Chief Complaint Chief Complaint  Patient presents with  . Abnormal Lab    HPI Darlene Howard is a 66 y.o. female.  HPI Patient has a history of lung cancer and multiple myeloma. She's been admitted several times in the last 1-2 months for pneumonia. Had labs drawn by her oncologist yesterday which demonstrated elevated calcium levels. Advised to come to the emergency department. Patient states she has ongoing lower extremity weakness for the past few months. She states this is not recently changed. She denies any shortness of breath or chest pain. She denies fever or chills. Patient has intermittent muscle aches. No new lower extremity swelling. Past Medical History:  Diagnosis Date  . Bruises easily    d/t being on COumadin  . Cholelithiasis   . DDD (degenerative disc disease), lumbar   . Dehydration 03/11/2017  . Fatty liver   . GERD (gastroesophageal reflux disease)    takes Pantoprazole daily  . Glaucoma    uses eye drops as instructed  . 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 Herpes    takes Valtrex as needed  . History of colon polyps   . Hypercalcemia of malignancy 03/11/2017  . Hypertension    takes Amlodipine and Diovan daily  . Left radial fracture 01/21/2017  . Lung cancer ([place])    chest wall lesion aadn 6th rib  . Multiple myeloma DX  2006  STATE IIA,  IgA  KAPPA   STABLE  PER DR MOHAMED--  S/P STEM CELL TRANSPLANT 2007 /  RECURRENCE 2008  CHEMO ENDED 03-30-2008/   NOW ON MAINTANANCE REVLIMID )  . 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 Personal history of colon cancer    ASCENDING COLON--  S/P  RIGHT HEMICOLECTOMY WITH NEGATIVE NODES/    NO RECURRENCE  . Type 2 diabetes mellitus ([place])   . Vocal cord nodule    resolved- see Dr. Deeann Saint notes in media tab 06/17/14    Patient Active  Problem List   Diagnosis Date Noted  . Malnutrition of moderate degree 04/20/2017  . Hypokalemia 04/19/2017  . Sepsis ([place]) 04/19/2017  . HCAP (healthcare-associated pneumonia) 04/12/2017  . 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 Lobar pneumonia ([place]) 04/03/2017  . Depression 03/18/2017  . Protein calorie malnutrition ([place]) 03/18/2017  . Hypercalcemia of malignancy 03/11/2017  . Dehydration 03/11/2017  . Anemia of chronic disease 02/11/2017  . ERRONEOUS ENCOUNTER--DISREGARD 01/27/2017  . Left radial fracture 01/21/2017  . Encounter for antineoplastic chemotherapy 08/27/2016  . Insomnia 05/23/2016  . Thrombocytopenia ([place]) 05/21/2016  . Multiple myeloma not having achieved remission ([place]) 03/12/2016  . Fatty liver 12/06/2014  . GERD (gastroesophageal reflux disease) 11/02/2014  . 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 Lower back pain 02/22/2014  . Goiter 02/22/2014  . Acute bronchitis 08/25/2013  . Maxillary sinusitis, acute 12/29/2012  . DISORDER OF BONE AND CARTILAGE UNSPECIFIED 08/16/2010  . Allergic rhinitis 04/30/2010  . Carcinoma in situ of colon 09/28/2008  . Multiple myeloma ([place]) 05/03/2008  . Glaucoma 05/03/2008  . Essential hypertension 05/03/2008    Past Surgical History:  Procedure Laterality Date  . ABDOMINAL HYSTERECTOMY  1983   w/  appendectomy and left salpingoophorectom  . CATARACT EXTRACTION  W/PHACO Left 01/20/2014   Procedure: CATARACT EXTRACTION PHACO AND INTRAOCULAR LENS PLACEMENT (IOC);  Surgeon: Adonis Brook, MD;  Location: [facility];  Service: Ophthalmology;  Laterality: Left;  . CERVICAL CONE BIOPSY    . CHOLECYSTECTOMY N/A 02/28/2016   Procedure: LAPAROSCOPIC CHOLECYSTECTOMY;  Surgeon: Ralene Ok, MD;  Location: [facility] OR;  Service: General;  Laterality: N/A;  . CO2  ABLATION VULVA PERIANAL AND VAGINAL DYSPLATIC AREAS  02-15-2010  . COLONOSCOPY   10/07/2006   QQV:ZDGLOV rectum/Status post right hemicolectomy. Normal residual colon  . COLONOSCOPY  10/2009   RMR: diminutive RS polyp (hyperplastic).   . COLONOSCOPY N/A 12/15/2014   Procedure: COLONOSCOPY;  Surgeon: Daneil Dolin, MD;  [facility];  Service: Endoscopy;  Laterality: N/A;  130   . ESOPHAGOGASTRODUODENOSCOPY  04/02/2005   FIE:[id number] polyp with central erosion versus ulceration in the body of the stomach, resected and recovered Remainder of the gastric mucosa, D1, D2 appeared normal  . HEMICOLECTOMY Right 04-27-2005   CARCINOMA   ASCENDING COLON  . LESION REMOVAL N/A 05/06/2014   Procedure: LASER OF THE VAGINA;  Surgeon: Janie Morning, MD;  Location: [facility];  Service: Gynecology;  Laterality: N/A;  . LIMBAL STEM CELL TRANSPLANT  04-26-2006    ([facility])  . VULVAR LESION REMOVAL N/A 05/06/2014   Procedure: LASER OF VULVAR LESION;  Surgeon: Janie Morning, MD;  Location: [facility];  Service: Gynecology;  Laterality: N/A;  . VULVECTOMY N/A 05/06/2014   Procedure: WIDE LOCAL EXCISION LEFT LABIA MAJORA;  Surgeon: Janie Morning, MD;  Location: [facility];  Service: Gynecology;  Laterality: N/A;    OB History    No data available       Home Medications    Prior to Admission medications   Medication Sig Start Date End Date Taking? Authorizing Provider  aspirin EC 81 MG tablet Take 81 mg by mouth daily.    Yes Historical Provider, MD  dexamethasone (DECADRON) 4 MG tablet TAKE 10 TABS ONCE WEEKLY  WHILE UNDERGOING VELCADE  INJECTIONS. Patient taking differently: Take 10 tablets by mouth every Monday while undergoing Velcade injections 01/29/17  Yes Curt Bears, MD  dorzolamide-timolol (COSOPT) 22.3-6.8 MG/ML ophthalmic solution Place 1 drop into the left eye 2 (two) times daily.    Yes Historical Provider, MD  gabapentin (NEURONTIN) 100 MG capsule Take 100 mg by mouth daily.   Yes Historical Provider,  MD  latanoprost (XALATAN) 0.005 % ophthalmic solution Place 1 drop into both eyes at bedtime. 04/15/17  Yes Hosie Poisson, MD  levofloxacin (LEVAQUIN) 500 MG tablet Take 1 tablet (500 mg total) by mouth daily. 04/25/17  Yes Charlynne Cousins, MD  loperamide (IMODIUM) 2 MG capsule Take 2-4 mg by mouth every 2 (two) hours as needed for diarrhea or loose stools.   Yes Historical Provider, MD  loratadine (CLARITIN) 10 MG tablet TAKE ONE TABLET BY MOUTH ONCE DAILY. 04/02/17  Yes Fayrene Helper, MD  LUMIGAN 0.01 % SOLN Place 1 drop into both eyes at bedtime.    Yes Historical Provider, MD  mirtazapine (REMERON) 30 MG tablet Take 1 tablet (30 mg total) by mouth at bedtime. 04/17/17  Yes Curt Bears, MD  Multiple Vitamin (MULTIVITAMIN WITH MINERALS) TABS tablet Take 1 tablet by mouth daily.   Yes Historical Provider, MD  oxyCODONE (OXYCONTIN) 15 mg 12 hr tablet Take 1 tablet (15 mg total) by mouth every 12 (twelve) hours. 04/25/17  Yes Charlynne Cousins, MD  oxyCODONE-acetaminophen (PERCOCET/ROXICET)  5-325 MG tablet Take 1 tablet by mouth every 4 (four) hours as needed for severe pain. May take one tablet every 4-6 hours as needed for pain 04/17/17  Yes Curt Bears, MD  pantoprazole (PROTONIX) 40 MG tablet TAKE 1 TABLET BY MOUTH  DAILY FOR ACID REFLUX 10/18/16  Yes Fayrene Helper, MD  potassium chloride SA (K-DUR,KLOR-CON) 20 MEQ tablet Take 1 tablet (20 mEq total) by mouth 2 (two) times daily. 04/25/17  Yes Charlynne Cousins, MD  predniSONE (DELTASONE) 20 MG tablet Take 1 tablet (20 mg total) by mouth daily with breakfast. 04/26/17  Yes Charlynne Cousins, MD  temazepam (RESTORIL) 15 MG capsule TAKE 1 CAPSULE AT BEDTIME AS NEEDED FOR SLEEP. 08/23/16  Yes Fayrene Helper, MD  acetaminophen (TYLENOL) 500 MG tablet Take 500 mg by mouth every 6 (six) hours as needed for mild pain, moderate pain, fever or headache.     Historical Provider, MD  feeding supplement, ENSURE ENLIVE, (ENSURE ENLIVE) LIQD  Take 237 mLs by mouth 2 (two) times daily between meals. Patient not taking: Reported on 04/19/2017 04/16/17   Hosie Poisson, MD  guaiFENesin-dextromethorphan (ROBITUSSIN DM) 100-10 MG/5ML syrup Take 5 mLs by mouth every 4 (four) hours as needed for cough. Patient not taking: Reported on 04/30/2017 04/07/17   Caren Griffins, MD  pomalidomide (POMALYST) 4 MG capsule Take 1 capsule (4 mg total) by mouth daily. Take with water on days 1-21. Repeat every 28 days. 04/17/17   Curt Bears, MD  PREMARIN vaginal cream PLACE 1 APPLICATORFUL VAGINALLY TWICE A WEEK. Patient not taking: Reported on 04/30/2017 06/29/16   Dorothyann Gibbs, NP  valACYclovir (VALTREX) 1000 MG tablet TAKE 1 TABLET BY MOUTH  DAILY AS NEEDED Patient taking differently: Take 1 tablet by mouth daily as needed for cold sores 03/19/17   Fayrene Helper, MD    Family History Family History  Problem Relation Age of Onset  . Kidney failure Mother   . Hypertension Mother   . Prostate cancer Father 12  . Hypertension Father     vascular disease   . Stroke Father   . Hypertension Sister   . Hyperlipidemia Sister   . Hypertension Brother   . Hyperlipidemia Brother   . Hypertension Sister   . Hypertension Sister   . Colon cancer Neg Hx   . Breast cancer Neg Hx     Social History Social History  Substance Use Topics  . Smoking status: Never Smoker  . Smokeless tobacco: Never Used  . Alcohol use No     Allergies   Codeine and Tramadol   Review of Systems Review of Systems  Constitutional: Positive for fatigue. Negative for chills and fever.  Respiratory: Negative for cough and shortness of breath.   Cardiovascular: Negative for chest pain, palpitations and leg swelling.  Gastrointestinal: Negative for abdominal pain, diarrhea, nausea and vomiting.  Genitourinary: Negative for dysuria, flank pain and frequency.  Musculoskeletal: Positive for myalgias. Negative for back pain, neck pain and neck stiffness.  Skin: Negative  for wound.  Neurological: Positive for weakness. Negative for dizziness, numbness and headaches.  All other systems reviewed and are negative.    Physical Exam Updated Vital Signs BP (!) 154/93   Pulse (!) 121   Temp 98.3 F (36.8 C)   Resp 18   Ht _0  (1.676 m)   Wt 172 lb (78 kg)   SpO2 95%   BMI 27.76 kg/m   Physical Exam  Constitutional: She is  oriented to person, place, and time. She appears well-developed and well-nourished. No distress.  HENT:  Head: Normocephalic and atraumatic.  Mouth/Throat: Oropharynx is clear and moist.  Eyes: EOM are normal. Pupils are equal, round, and reactive to light.  Neck: Normal range of motion. Neck supple. No JVD present.  Cardiovascular: Regular rhythm.  Exam reveals no gallop and no friction rub.   No murmur heard. Tachycardia  Pulmonary/Chest: Effort normal. She has rales.  Few scattered rales. No respiratory distress.  Abdominal: Soft. Bowel sounds are normal. There is no tenderness. There is no rebound and no guarding.  Musculoskeletal: Normal range of motion. She exhibits no edema or tenderness.  No calf swelling, asymmetry or tenderness. Distal pulses are 2+.  Neurological: She is alert and oriented to person, place, and time.  1/5 motor in bilateral hip flexors. Moving toes without deficit. Bilateral grip strength intact. Sensation to light touch intact.  Skin: Skin is warm and dry. Capillary refill takes less than 2 seconds. No rash noted. No erythema.  Psychiatric: She has a normal mood and affect. Her behavior is normal.  Nursing note and vitals reviewed.    ED Treatments / Results  Labs (all labs ordered are listed, but only abnormal results are displayed) Labs Reviewed  CBC WITH DIFFERENTIAL/PLATELET - Abnormal; Notable for the following:       Result Value   WBC 14.7 (*)    RBC 3.63 (*)    Hemoglobin 11.0 (*)    HCT 34.6 (*)    RDW 18.8 (*)    Platelets 146 (*)    Neutro Abs 12.6 (*)    All other components  within normal limits  COMPREHENSIVE METABOLIC PANEL - Abnormal; Notable for the following:    Glucose, Bld 153 (*)    Calcium 12.9 (*)    Total Protein 8.5 (*)    Albumin 2.4 (*)    ALT 13 (*)    All other components within normal limits  I-STAT CG4 LACTIC ACID, ED - Abnormal; Notable for the following:    Lactic Acid, Venous 4.52 (*)    All other components within normal limits  CULTURE, BLOOD (ROUTINE X 2)  CULTURE, BLOOD (ROUTINE X 2)  MAGNESIUM  URINALYSIS, ROUTINE W REFLEX MICROSCOPIC  I-STAT TROPOININ, ED    EKG  EKG Interpretation  Date/Time:  Tuesday Apr 30 2017 16:02:17 EDT Ventricular Rate:  125 PR Interval:    QRS Duration: 81 QT Interval:  355 QTC Calculation: 512 R Axis:   -48 Text Interpretation:  Sinus tachycardia Inferior infarct, old Abnormal lateral Q waves Anterior infarct, old Prolonged QT interval Confirmed by Lita Mains  MD, Roshaun Pound ([id number]) on 04/30/2017 9:15:41 PM       Radiology Ct Angio Chest Pe W And/or Wo Contrast  Result Date: 04/30/2017 CLINICAL DATA:  Weakness. Abnormal blood calcium. History of multiple myeloma. EXAM: CT ANGIOGRAPHY CHEST WITH CONTRAST TECHNIQUE: Multidetector CT imaging of the chest was performed using the standard protocol during bolus administration of intravenous contrast. Multiplanar CT image reconstructions and MIPs were obtained to evaluate the vascular anatomy. CONTRAST:  100 mL of Isovue 370 COMPARISON:  Chest CT April 25, 2016.  Multiple recent chest x-rays. FINDINGS: Cardiovascular: The heart is unchanged. The thoracic aorta is normal in caliber. No dissection. The main pulmonary artery measures up to 3.8 cm, mildly enlarged but similar in the interval. Evaluation of pulmonary arteries is limited due to respiratory motion and right-sided pulmonary opacities. Within these limitations, no pulmonary emboli are identified.  Mediastinum/Nodes: No axillary adenopathy. There is adenopathy in the base of the neck bilaterally which is  new. A representative node on series 4, image 1 measures 2.2 cm in diameter an crosses the thoracic inlet. Mediastinal adenopathy is also worse in the interval with nodes in the prevascular space, left paratracheal region, and in the left periaortic region with numerous nodes adjacent to the descending thoracic aorta. At least 1 abnormal node is seen between the esophagus and descending aorta. Retrocrural nodes are identified, also enlarged. The thyroid is normal. The esophagus itself is unremarkable. The pleural effusions. No significant pericardial effusion. Lungs/Pleura: A nodule in the right upper lobe on series 7, image 24 measures 7 mm today versus 5 mm previously. No other pulmonary nodules. Focal opacity centered in the right perihilar region with air bronchograms is identified. Moderate effusions bilaterally with associated atelectasis. Mild pleural thickening not excluded laterally on the right on series 7, image 46 versus pleural fluid. Upper Abdomen: There appears to be a rounded low-attenuation lesion in the upper liver measuring up to 2.5 cm. This was not a 37 seen previously. Another low-attenuation lesion is seen in the right hepatic lobe on image 74 and a third lesion may be seen in the left hepatic lobe on image 73. Thickening of the left adrenal gland is new and suspicious. A tiny nodule in the right adrenal gland is stable. No other abnormalities in the upper abdomen. Musculoskeletal: The previously identified right posterior sixth rib metastasis has been replaced by a bony sclerosis. Numerous active bony metastases are seen however. 2 destructive metastases are seen in the right fourth rib. Metastases however are seen in multiple ribs. Bony destruction is seen of the right posterior ninth rib with a soft tissue nodule. Active bony destruction by soft tissue nodules seen on lateral left rib on image 42. Another is seen on image 56. Bony destruction is seen in the anterior tenth thoracic vertebral  body. A rim of tissue is seen at the right lateral aspects of T9 and T10 consistent with extraosseous extension of bony metastases. A metastasis is seen at the superior endplate of L1. Clavicular lesion are seen on the left. Left-sided scapular lesions are identified. A destructive lesion is seen in the ninth thoracic vertebral body right transverse process. Review of the MIP images confirms the above findings. IMPRESSION: 1. No pulmonary emboli identified. 2. Focal right perihilar infiltrate with air bronchograms. Unless the patient has had radiation therapy to the chest, these findings are most consistent with pneumonia. Recommend follow-up to resolution. 3. Worsening metastatic disease in the chest. A nodule in the right upper lobe is larger in the interval. Adenopathy in the base of the neck in the mediastinum has significantly worsened as well. While the largest metastatic lesion to the right posterior sixth rib and has improved, the remainder of the bony and involvement has worsened. 4. Three low-attenuation lesions in the liver worrisome for metastatic disease. 5. New nodularity in the left adrenal gland is suggestive of metastatic disease as well. 6. New bilateral moderate pleural effusions. Electronically Signed   By: Dorise Bullion III M.D   On: 04/30/2017 19:58   Dg Chest Port 1 View  Result Date: 04/30/2017 CLINICAL DATA:  Weakness. EXAM: PORTABLE CHEST 1 VIEW COMPARISON:  04/19/2017 as well as chest CT 04/25/2016 FINDINGS: Lungs are adequately inflated demonstrate persistent right perihilar opacification with no change to slight improvement likely due in part to right posterior chest wall myeloma. Left lung is clear. Cardiac silhouette  is within normal. Left lateral rib fractures unchanged likely pathologic in may be related to patient's known myeloma. Remainder of the exam is unchanged. IMPRESSION: No acute findings. Stable opacification of the right perihilar region which may be due in part to  known right posterior chest wall myeloma. Stable left lateral rib fractures likely pathologic due to known myeloma. Electronically Signed   By: Marin Olp M.D.   On: 04/30/2017 18:02    Procedures Procedures (including critical care time)  Medications Ordered in ED Medications  vancomycin (VANCOCIN) IVPB 1000 mg/200 mL premix (1,000 mg Intravenous New Bag/Given 04/30/17 2102)  piperacillin-tazobactam (ZOSYN) IVPB 3.375 g (3.375 g Intravenous New Bag/Given 04/30/17 2102)  sodium chloride 0.9 % bolus 1,800 mL (not administered)  sodium chloride 0.9 % bolus 500 mL (0 mLs Intravenous Stopped 04/30/17 1919)  iopamidol (ISOVUE-370) 76 % injection (100 mLs Intravenous Contrast Given 04/30/17 1917)   CRITICAL CARE Performed by: Lita Mains, Naman Spychalski Total critical care time: 35 minutes Critical care time was exclusive of separately billable procedures and treating other patients. Critical care was necessary to treat or prevent imminent or life-threatening deterioration. Critical care was time spent personally by me on the following activities: development of treatment plan with patient and/or surrogate as well as nursing, discussions with consultants, evaluation of patient's response to treatment, examination of patient, obtaining history from patient or surrogate, ordering and performing treatments and interventions, ordering and review of laboratory studies, ordering and review of radiographic studies, pulse oximetry and re-evaluation of patient's condition.  Initial Impression / Assessment and Plan / ED Course  I have reviewed the triage vital signs and the nursing notes.  Pertinent labs & imaging results that were available during my care of the patient were reviewed by me and considered in my medical decision making (see chart for details).    Patient with persistently elevated white blood cell count. X-ray without acute findings. Patient does have hypercalcemia with calcium levels of 12.9. CT  angiogram chest obtained to rule out PE. No PE but demonstrates persistent right perihilar pneumonia and bilateral pleural effusions with progressive metastatic disease. Patient noted to have elevated lactic acid. Initiated broad-spectrum antibiotics after blood cultures drawn. Will give 30 mL per KG of IV fluids. Discussed with hospitalist and will admit.   Final Clinical Impressions(s) / ED Diagnoses   Final diagnoses:  Hypercalcemia  Recurrent pneumonia  Sepsis due to pneumonia [facility])    New Prescriptions New Prescriptions   No medications on file     Julianne Rice, MD 04/30/17 2117

## 2017-04-30 NOTE — Telephone Encounter (Signed)
Call to pt home# as other # listed unavailable and vm was full. CNA answered who advised pt is unable to sit up or walk, she suggested we call pt sister in law Marva.  Call to [facility] informed her of pt elevated calcium.   MD would like pt to come in today for IVF and Zometa.  Marva advised she is unable to get pt to [facility] as pt cannot move and family is out of town and she does not have enough help to get pt into the car.  Reviewed with MD who instructed pt to go to ED due to elevated calcium.  Marva verbalized understanding no further concerns.

## 2017-04-30 NOTE — ED Notes (Signed)
Pt transported to CT ?

## 2017-04-30 NOTE — ED Notes (Signed)
Pt is alert and verbally responsive pt has family at bedside. Pt is incontinent and has been washed up.

## 2017-04-30 NOTE — ED Triage Notes (Signed)
Patient is from home and transported by [facility]. Pt had blood drawn yesterday from home heatlh at her home. Today, her Calcium level is 13. Dr. Earlie Server would like her to be evaluated. Pt reports she feels weak like she normally does.

## 2017-05-01 DIAGNOSIS — R000 Tachycardia, unspecified: Secondary | ICD-10-CM

## 2017-05-01 LAB — BASIC METABOLIC PANEL
ANION GAP: 11 (ref 5–15)
BUN: 8 mg/dL (ref 6–20)
CHLORIDE: 111 mmol/L (ref 101–111)
CO2: 25 mmol/L (ref 22–32)
Calcium: 12.2 mg/dL — ABNORMAL HIGH (ref 8.9–10.3)
Creatinine, Ser: 0.68 mg/dL (ref 0.44–1.00)
GFR calc Af Amer: >60 mL/min (ref >60)
GFR calc non Af Amer: >60 mL/min (ref >60)
GLUCOSE: 89 mg/dL (ref 65–99)
POTASSIUM: 3 mmol/L — AB (ref 3.5–5.1)
Sodium: 147 mmol/L — ABNORMAL HIGH (ref 135–145)

## 2017-05-01 LAB — CBC
HEMATOCRIT: 32.5 % — AB (ref 36.0–46.0)
Hemoglobin: 10.3 g/dL — ABNORMAL LOW (ref 12.0–15.0)
MCH: 29.8 pg (ref 26.0–34.0)
MCHC: 31.7 g/dL (ref 30.0–36.0)
MCV: 93.9 fL (ref 78.0–100.0)
Platelets: 147 10*3/uL — ABNORMAL LOW (ref 150–400)
RBC: 3.46 MIL/uL — AB (ref 3.87–5.11)
RDW: 18.7 % — ABNORMAL HIGH (ref 11.5–15.5)
WBC: 15.2 10*3/uL — AB (ref 4.0–10.5)

## 2017-05-01 LAB — MRSA PCR SCREENING: MRSA by PCR: NEGATIVE

## 2017-05-01 LAB — LACTIC ACID, PLASMA: Lactic Acid, Venous: 2 mmol/L (ref 0.5–1.9)

## 2017-05-01 LAB — MAGNESIUM: MAGNESIUM: 1.5 mg/dL — AB (ref 1.7–2.4)

## 2017-05-01 MED ORDER — MAGNESIUM SULFATE 2 GM/50ML IV SOLN
2.0000 g | Freq: Once | INTRAVENOUS | Status: AC
  Administered 2017-05-01: 2 g via INTRAVENOUS
  Filled 2017-05-01: qty 50

## 2017-05-01 MED ORDER — ZOLEDRONIC ACID 4 MG/5ML IV CONC
4.0000 mg | Freq: Once | INTRAVENOUS | Status: AC
  Administered 2017-05-01: 4 mg via INTRAVENOUS
  Filled 2017-05-01: qty 5

## 2017-05-01 MED ORDER — POTASSIUM CHLORIDE CRYS ER 20 MEQ PO TBCR
40.0000 meq | EXTENDED_RELEASE_TABLET | Freq: Once | ORAL | Status: AC
  Administered 2017-05-01: 40 meq via ORAL
  Filled 2017-05-01: qty 2

## 2017-05-01 MED ORDER — SODIUM CHLORIDE 0.45 % IV SOLN
INTRAVENOUS | Status: DC
  Administered 2017-05-01 – 2017-05-02 (×3): via INTRAVENOUS

## 2017-05-01 MED ORDER — FUROSEMIDE 10 MG/ML IJ SOLN
40.0000 mg | Freq: Three times a day (TID) | INTRAMUSCULAR | Status: DC
  Administered 2017-05-01: 40 mg via INTRAVENOUS
  Filled 2017-05-01: qty 4

## 2017-05-01 MED ORDER — VANCOMYCIN HCL IN DEXTROSE 750-5 MG/150ML-% IV SOLN
750.0000 mg | Freq: Two times a day (BID) | INTRAVENOUS | Status: DC
  Filled 2017-05-01: qty 150

## 2017-05-01 MED ORDER — CALCITONIN (SALMON) 200 UNIT/ML IJ SOLN
350.0000 [IU] | Freq: Two times a day (BID) | INTRAMUSCULAR | Status: DC
  Filled 2017-05-01: qty 1.75

## 2017-05-01 MED ORDER — LEVOFLOXACIN 500 MG PO TABS
500.0000 mg | ORAL_TABLET | ORAL | Status: DC
  Administered 2017-05-01: 500 mg via ORAL
  Filled 2017-05-01 (×4): qty 1

## 2017-05-01 MED ORDER — VITAMINS A & D EX OINT
TOPICAL_OINTMENT | CUTANEOUS | Status: AC
Start: 2017-05-01 — End: 2017-05-01
  Administered 2017-05-01: 12:00:00
  Filled 2017-05-01: qty 5

## 2017-05-01 MED ORDER — POTASSIUM CHLORIDE CRYS ER 20 MEQ PO TBCR
40.0000 meq | EXTENDED_RELEASE_TABLET | ORAL | Status: AC
  Administered 2017-05-01 (×2): 40 meq via ORAL
  Filled 2017-05-01 (×2): qty 2

## 2017-05-01 MED ORDER — FUROSEMIDE 10 MG/ML IJ SOLN
40.0000 mg | Freq: Two times a day (BID) | INTRAMUSCULAR | Status: DC
  Administered 2017-05-01 – 2017-05-02 (×2): 40 mg via INTRAVENOUS
  Filled 2017-05-01 (×2): qty 4

## 2017-05-01 MED ORDER — CALCITONIN (SALMON) 200 UNIT/ML IJ SOLN
350.0000 [IU] | Freq: Two times a day (BID) | INTRAMUSCULAR | Status: DC
  Administered 2017-05-01 (×2): 350 [IU] via INTRAMUSCULAR
  Filled 2017-05-01 (×3): qty 1.75

## 2017-05-01 MED ORDER — LOPERAMIDE HCL 2 MG PO CAPS
2.0000 mg | ORAL_CAPSULE | ORAL | Status: DC | PRN
  Administered 2017-05-01 (×2): 2 mg via ORAL
  Filled 2017-05-01 (×2): qty 1

## 2017-05-01 MED ORDER — LABETALOL HCL 5 MG/ML IV SOLN
10.0000 mg | INTRAVENOUS | Status: DC | PRN
  Administered 2017-05-01 (×2): 10 mg via INTRAVENOUS
  Filled 2017-05-01 (×3): qty 4

## 2017-05-01 NOTE — Progress Notes (Signed)
Pharmacy Antibiotic Note  Darlene Howard is a 66 y.o. female admitted on 04/30/2017 with pneumonia.  PMH significant for lung cancer and multiple myeloma.  Recent admission for pneumonia.  Recent labwork revealed elevated calcium and pt directed to come to the ED.  CTAngio shows persistent pneumonia.  In the ED patient received Vancomycin 1g and Zosyn 3.375g IV x 1 dose each.  Pharmacy has been consulted for Vancomycin and Cefepime dosing.  Plan:  Adjust Vancomycin to 732m IV q12h per previous trough level/dosing history.  Continue Cefepime 1g IV q8h.  Follow renal function, cultures, clinical course, ability to narrow therapy.    Height: _0  (167.6 cm) Weight: 172 lb (78 kg) IBW/kg (Calculated) : 59.3  Temp (24hrs), Avg:98.1 F (36.7 C), Min:97.6 F (36.4 C), Max:98.4 F (36.9 C)   Recent Labs Lab 04/25/17 0630 04/30/17 1702 04/30/17 2057 04/30/17 2224 05/01/17 0334  WBC  --  14.7*  --   --  15.2*  CREATININE 0.68 0.68  --   --  0.68  LATICACIDVEN  --   --  4.52* 2.1* 2.0*    Estimated Creatinine Clearance: 72 mL/min (by C-G formula based on SCr of 0.68 mg/dL).    Allergies  Allergen Reactions  . Codeine Nausea And Vomiting  . Tramadol Nausea And Vomiting    Antimicrobials this admission: 5/1 zosyn x 1 dose  5/1 vanc >>   5/2 cefepime >>  Microbiology results: 5/1 BCx: NGTD 5/2 UCx: sent 5/1 MRSA PCR: negative  Thank you for allowing pharmacy to be a part of this patient's care.    JLindell Spar PharmD, BCPS Pager: [phone]/01/2017 1:17 PM

## 2017-05-01 NOTE — Progress Notes (Signed)
PROGRESS NOTE    MAKENSIE MULHALL   [facility]U:[id number]  DOB: 03/05/1951  DOA: 04/30/2017 PCP: [name], MD   Brief Narrative:  Darlene Howard is a 66 y.o. woman with a history of multiple myeloma (Followed by Dr. Earlie Server but she has also been seen at [facility]), colon cancer S/P right hemicolectomy, pre-diabetes, HTN, GERD, AOCD (malignancy), and hypercalcemia of malignancy who presents to the ED after being notified that her calcium level was 13 and required treatment.  She had a recent admission for sepsis secondary to HCAP from 4/19 - 4/28.  During that time, she also had hypercalcemia, and she received Zometa on 4/25.   Subjective: No change in physical complaints since being discharged. She has been too weak to stand up. Appetite is poor. No cough or dyspnea. No confusion.   Assessment & Plan:   Principal Problem:   Hypercalcemia of malignancy/   Multiple myeloma not having achieved remission - as mentioned, had just received Zometa on 4/25 and was on Prednisone to help with hypercalcemia - discussed plan with Dr [name] today- he recommends another dose of Zometa and palliative care consult as he does not feel he can further treat her cancer - start Calcitonin for 4 doses - cont IVF and Lasix - Palliative care consulted- I discussed this with the patient and her sister in law  Active Problems:    HCAP (healthcare-associated pneumonia)   - Initially treated with Zosyn, Vanc and Levaquin given high risk for MDR - MRSA PCR negative, Vanco d/c'd, Received 5 days of Zosyn d/ced on 04/24/17 - changed to oral Levaquin which she was discharged on - appears asymptomatic- Right perihilar infiltrate on CT, leukocytosis, lactic acidosis (could be due to dehydration from hypercalcemia) - resume Levaquin for now  Hypokalemia, hypomagnesemia - replacing  Hypernatremia - change to 1/2 NS    Sinus tachycardia - was present on prior admission as well    Essential hypertension Per family,  anti-hypertensives actually discontinued 1-2 months ago for relative hypotension. - on PRN labetalol     Anemia of chronic disease - Hb stable   DVT prophylaxis: Lovenox Code Status: DNR Family Communication: sister in law Disposition Plan: home with hospice vs hospice home Consultants:   Palliative care Procedures:     Antimicrobials:  Anti-infectives    Start     Dose/Rate Route Frequency Ordered Stop   05/01/17 2100  vancomycin (VANCOCIN) IVPB 750 mg/150 ml premix     750 mg 150 mL/hr over 60 Minutes Intravenous Every 12 hours 05/01/17 1313     05/01/17 0900  vancomycin (VANCOCIN) IVPB 1000 mg/200 mL premix  Status:  Discontinued     1,000 mg 200 mL/hr over 60 Minutes Intravenous Every 12 hours 04/30/17 2215 05/01/17 1313   05/01/17 0300  ceFEPIme (MAXIPIME) 1 g in dextrose 5 % 50 mL IVPB     1 g 100 mL/hr over 30 Minutes Intravenous Every 8 hours 04/30/17 2215     04/30/17 2100  vancomycin (VANCOCIN) IVPB 1000 mg/200 mL premix     1,000 mg 200 mL/hr over 60 Minutes Intravenous  Once 04/30/17 2047 04/30/17 2229   04/30/17 2100  piperacillin-tazobactam (ZOSYN) IVPB 3.375 g     3.375 g 100 mL/hr over 30 Minutes Intravenous  Once 04/30/17 2047 04/30/17 2145       Objective: Vitals:   05/01/17 0800 05/01/17 0900 05/01/17 0909 05/01/17 1200  BP: (!) 168/81 (!) 192/112 (!) 202/107 (!) 149/73  Pulse: (!) 103 Marland Kitchen)  113 (!) 115 (!) 102  Resp: 15 (!) '22 17 15  ' Temp: 97.6 F (36.4 C)     TempSrc: Oral     SpO2: 97% 98% 97% 98%  Weight:      Height:        Intake/Output Summary (Last 24 hours) at 05/01/17 1314 Last data filed at 05/01/17 1030  Gross per 24 hour  Intake          2258.33 ml  Output              150 ml  Net          2108.33 ml   Filed Weights   04/30/17 1600  Weight: 78 kg (172 lb)    Examination: General exam: Appears comfortable  HEENT: PERRLA, oral mucosa moist, no sclera icterus or thrush Respiratory system: Clear to auscultation. Respiratory  effort normal. Cardiovascular system: S1 & S2 heard, RRR.  No murmurs - tachycardic  Gastrointestinal system: Abdomen soft, non-tender, nondistended. Normal bowel sound. No organomegaly Central nervous system: Alert and oriented. No focal neurological deficits. Extremities: No cyanosis, clubbing or edema Skin: No rashes or ulcers Psychiatry:  Mood & affect appropriate.     Data Reviewed: I have personally reviewed following labs and imaging studies  CBC:  Recent Labs Lab 04/30/17 1702 05/01/17 0334  WBC 14.7* 15.2*  NEUTROABS 12.6*  --   HGB 11.0* 10.3*  HCT 34.6* 32.5*  MCV 95.3 93.9  PLT 146* 836*   Basic Metabolic Panel:  Recent Labs Lab 04/25/17 0630 04/30/17 1702 05/01/17 0334  NA 142 142 147*  K 3.0* 3.7 3.0*  CL 106 108 111  CO2 '24 23 25  ' GLUCOSE 80 153* 89  BUN '11 10 8  ' CREATININE 0.68 0.68 0.68  CALCIUM 12.0* 12.9* 12.2*  MG 1.8 1.7 1.5*   GFR: Estimated Creatinine Clearance: 72 mL/min (by C-G formula based on SCr of 0.68 mg/dL). Liver Function Tests:  Recent Labs Lab 04/30/17 1702  AST 31  ALT 13*  ALKPHOS 63  BILITOT 0.6  PROT 8.5*  ALBUMIN 2.4*   No results for input(s): LIPASE, AMYLASE in the last 168 hours. No results for input(s): AMMONIA in the last 168 hours. Coagulation Profile:  Recent Labs Lab 04/30/17 2224  INR 1.31   Cardiac Enzymes: No results for input(s): CKTOTAL, CKMB, CKMBINDEX, TROPONINI in the last 168 hours. BNP (last 3 results) No results for input(s): PROBNP in the last 8760 hours. HbA1C: No results for input(s): HGBA1C in the last 72 hours. CBG: No results for input(s): GLUCAP in the last 168 hours. Lipid Profile: No results for input(s): CHOL, HDL, LDLCALC, TRIG, CHOLHDL, LDLDIRECT in the last 72 hours. Thyroid Function Tests: No results for input(s): TSH, T4TOTAL, FREET4, T3FREE, THYROIDAB in the last 72 hours. Anemia Panel: No results for input(s): VITAMINB12, FOLATE, FERRITIN, TIBC, IRON, RETICCTPCT in  the last 72 hours. Urine analysis:    Component Value Date/Time   COLORURINE YELLOW 04/19/2017 0500   APPEARANCEUR CLOUDY (A) 04/19/2017 0500   LABSPEC 1.025 04/19/2017 0500   PHURINE 5.0 04/19/2017 0500   GLUCOSEU NEGATIVE 04/19/2017 0500   HGBUR SMALL (A) 04/19/2017 0500   BILIRUBINUR NEGATIVE 04/19/2017 0500   BILIRUBINUR neg 03/09/2015 1333   KETONESUR NEGATIVE 04/19/2017 0500   PROTEINUR 100 (A) 04/19/2017 0500   UROBILINOGEN 0.2 03/09/2015 1333   NITRITE NEGATIVE 04/19/2017 0500   LEUKOCYTESUR MODERATE (A) 04/19/2017 0500   Sepsis Labs: '@LABRCNTIP' (procalcitonin:4,lacticidven:4) ) Recent Results (from the past 240 hour(s))  MRSA PCR Screening     Status: None   Collection Time: 04/22/17  8:53 AM  Result Value Ref Range Status   MRSA by PCR NEGATIVE NEGATIVE Final    Comment:        The GeneXpert MRSA Assay (FDA approved for NASAL specimens only), is one component of a comprehensive MRSA colonization surveillance program. It is not intended to diagnose MRSA infection nor to guide or monitor treatment for MRSA infections.   Culture, blood (Routine X 2) w Reflex to ID Panel     Status: None (Preliminary result)   Collection Time: 04/30/17  5:35 PM  Result Value Ref Range Status   Specimen Description BLOOD BLOOD RIGHT FOREARM  Final   Special Requests   Final    BOTTLES DRAWN AEROBIC AND ANAEROBIC Blood Culture adequate volume   Culture   Final    NO GROWTH < 12 HOURS Performed at [facility], [place] [address], [place], [place] [address]    Report Status PENDING  Incomplete  Culture, blood (Routine X 2) w Reflex to ID Panel     Status: None (Preliminary result)   Collection Time: 04/30/17  5:50 PM  Result Value Ref Range Status   Specimen Description BLOOD LEFT HAND  Final   Special Requests IN PEDIATRIC BOTTLE Blood Culture adequate volume  Final   Culture   Final    NO GROWTH < 12 HOURS Performed at [facility], [place] [address] [place],  [place] [address]    Report Status PENDING  Incomplete  MRSA PCR Screening     Status: None   Collection Time: 04/30/17 10:53 PM  Result Value Ref Range Status   MRSA by PCR NEGATIVE NEGATIVE Final    Comment:        The GeneXpert MRSA Assay (FDA approved for NASAL specimens only), is one component of a comprehensive MRSA colonization surveillance program. It is not intended to diagnose MRSA infection nor to guide or monitor treatment for MRSA infections.          Radiology Studies: Ct Angio Chest Pe W And/or Wo Contrast  Result Date: 04/30/2017 CLINICAL DATA:  Weakness. Abnormal blood calcium. History of multiple myeloma. EXAM: CT ANGIOGRAPHY CHEST WITH CONTRAST TECHNIQUE: Multidetector CT imaging of the chest was performed using the standard protocol during bolus administration of intravenous contrast. Multiplanar CT image reconstructions and MIPs were obtained to evaluate the vascular anatomy. CONTRAST:  100 mL of Isovue 370 COMPARISON:  Chest CT April 25, 2016.  Multiple recent chest x-rays. FINDINGS: Cardiovascular: The heart is unchanged. The thoracic aorta is normal in caliber. No dissection. The main pulmonary artery measures up to 3.8 cm, mildly enlarged but similar in the interval. Evaluation of pulmonary arteries is limited due to respiratory motion and right-sided pulmonary opacities. Within these limitations, no pulmonary emboli are identified. Mediastinum/Nodes: No axillary adenopathy. There is adenopathy in the base of the neck bilaterally which is new. A representative node on series 4, image 1 measures 2.2 cm in diameter an crosses the thoracic inlet. Mediastinal adenopathy is also worse in the interval with nodes in the prevascular space, left paratracheal region, and in the left periaortic region with numerous nodes adjacent to the descending thoracic aorta. At least 1 abnormal node is seen between the esophagus and descending aorta. Retrocrural nodes are identified, also  enlarged. The thyroid is normal. The esophagus itself is unremarkable. The pleural effusions. No significant pericardial effusion. Lungs/Pleura: A nodule in the right  upper lobe on series 7, image 24 measures 7 mm today versus 5 mm previously. No other pulmonary nodules. Focal opacity centered in the right perihilar region with air bronchograms is identified. Moderate effusions bilaterally with associated atelectasis. Mild pleural thickening not excluded laterally on the right on series 7, image 46 versus pleural fluid. Upper Abdomen: There appears to be a rounded low-attenuation lesion in the upper liver measuring up to 2.5 cm. This was not a 38 seen previously. Another low-attenuation lesion is seen in the right hepatic lobe on image 74 and a third lesion may be seen in the left hepatic lobe on image 73. Thickening of the left adrenal gland is new and suspicious. A tiny nodule in the right adrenal gland is stable. No other abnormalities in the upper abdomen. Musculoskeletal: The previously identified right posterior sixth rib metastasis has been replaced by a bony sclerosis. Numerous active bony metastases are seen however. 2 destructive metastases are seen in the right fourth rib. Metastases however are seen in multiple ribs. Bony destruction is seen of the right posterior ninth rib with a soft tissue nodule. Active bony destruction by soft tissue nodules seen on lateral left rib on image 42. Another is seen on image 56. Bony destruction is seen in the anterior tenth thoracic vertebral body. A rim of tissue is seen at the right lateral aspects of T9 and T10 consistent with extraosseous extension of bony metastases. A metastasis is seen at the superior endplate of L1. Clavicular lesion are seen on the left. Left-sided scapular lesions are identified. A destructive lesion is seen in the ninth thoracic vertebral body right transverse process. Review of the MIP images confirms the above findings. IMPRESSION: 1. No  pulmonary emboli identified. 2. Focal right perihilar infiltrate with air bronchograms. Unless the patient has had radiation therapy to the chest, these findings are most consistent with pneumonia. Recommend follow-up to resolution. 3. Worsening metastatic disease in the chest. A nodule in the right upper lobe is larger in the interval. Adenopathy in the base of the neck in the mediastinum has significantly worsened as well. While the largest metastatic lesion to the right posterior sixth rib and has improved, the remainder of the bony and involvement has worsened. 4. Three low-attenuation lesions in the liver worrisome for metastatic disease. 5. New nodularity in the left adrenal gland is suggestive of metastatic disease as well. 6. New bilateral moderate pleural effusions. Electronically Signed   By: Dorise Bullion III M.D   On: 04/30/2017 19:58   Dg Chest Port 1 View  Result Date: 04/30/2017 CLINICAL DATA:  Weakness. EXAM: PORTABLE CHEST 1 VIEW COMPARISON:  04/19/2017 as well as chest CT 04/25/2016 FINDINGS: Lungs are adequately inflated demonstrate persistent right perihilar opacification with no change to slight improvement likely due in part to right posterior chest wall myeloma. Left lung is clear. Cardiac silhouette is within normal. Left lateral rib fractures unchanged likely pathologic in may be related to patient's known myeloma. Remainder of the exam is unchanged. IMPRESSION: No acute findings. Stable opacification of the right perihilar region which may be due in part to known right posterior chest wall myeloma. Stable left lateral rib fractures likely pathologic due to known myeloma. Electronically Signed   By: Marin Olp M.D.   On: 04/30/2017 18:02      Scheduled Meds: . aspirin EC  81 mg Oral Daily  . calcitonin  350 Units Intramuscular Q12H  . dorzolamide-timolol  1 drop Left Eye BID  . enoxaparin (LOVENOX)  injection  40 mg Subcutaneous QHS  . furosemide  40 mg Intravenous Q8H  .  gabapentin  100 mg Oral Daily  . latanoprost  1 drop Both Eyes QHS  . loratadine  10 mg Oral Daily  . mirtazapine  30 mg Oral QHS  . multivitamin with minerals  1 tablet Oral Daily  . oxyCODONE  15 mg Oral Q12H  . pantoprazole  40 mg Oral Daily  . potassium chloride  40 mEq Oral Q4H  . predniSONE  20 mg Oral Q breakfast   Continuous Infusions: . sodium chloride 125 mL/hr at 05/01/17 0830  . ceFEPime (MAXIPIME) IV Stopped (05/01/17 1158)  . vancomycin       LOS: 1 day    Time spent in minutes: 35    Debbe Odea, MD [facility] Pager: [email or web address] Password [email or web address] 05/01/2017, 1:14 PM

## 2017-05-01 NOTE — Progress Notes (Signed)
Nutrition Brief Note  Pt identified on Malnutrition Screening Tool Report. With multiple myeloma not having achieved remission. Family declining nutrition interventions at this time.   State pt is likely discharging tomorrow. Please consult RD as needed.   Arthur Holms, RD, LDN Pager #: [phone] After-Hours Pager #: [phone]

## 2017-05-01 NOTE — Consult Note (Signed)
Consultation Note Date: 05/01/2017   Patient Name: Darlene Howard  DOB: 1951-10-29  MRN: [id number]  Age / Sex: 66 y.o., female  PCP: Fayrene Helper, MD Referring Physician: Debbe Odea, MD  Reason for Consultation: Establishing goals of care  HPI/Patient Profile: 66 y.o. female  with past medical history of myeloma in relapse admitted on 04/30/2017 with PNA .   Clinical Assessment and Goals of Care: Patient has a history of multiple myeloma, she has been followed by Dr Earlie Server, how ever, was recently also seen at [facility], records from "care everywhere" reviewed in detail. Patient also has history of colon cancer, HTN, GERD.  She has been admitted with hypercalcemia of malignancy, sepsis, HCAP. She has been given Zometa, steroids, broad spectrum antibiotics.  A palliative consult has been requested for goals of care discussions.   The patient is resting in bed, there is no family at the bedside, I introduced myself and palliative care as follows: Palliative medicine is specialized medical care for people living with serious illness. It focuses on providing relief from the symptoms and stress of a serious illness. The goal is to improve quality of life for both the patient and the family.  The patient is tearful, yet tries to be stoic about facing her advancing serious illness and eventually her impending demise. We discussed that while we cannot accurately predict how much time she has or doesn't have, we could do a lot to ensure that she gets to live as well as she can, for as long as she can. We discussed about hospice philosophy of care, offering comfort and dignity at end of life.   All questions answered to the best of my ability, offered active listening and supportive care, I have left my card with her. See recommendations below, thank you for the consult.   NEXT OF KIN  brother( who lives next door) and  sister who lives with the patient. Patient also has another sister who reportedly has special needs.   SUMMARY OF RECOMMENDATIONS    Agree with DNR DNI.  Agree with current pain management options, see below.   Disposition: Patient lives at home with her sister in [place], [place], her brother lives next door. Patient is well aware of the advanced incurable nature of her illness. We discussed about residential hospice versus home with hospice, patient wishes to go home with hospice.   Case manager consult requested to help facilitate hospice at home arrangements.  Continue current mode of care for now Thank you for the consult.   Code Status/Advance Care Planning:  DNR    Symptom Management:    Agree with scheduled PO OxyContin as well as PRN PO percocet available for pain relief, will add IV PRN if patient has escalating pain needs.   Palliative Prophylaxis:   Delirium Protocol   Psycho-social/Spiritual:   Desire for further Chaplaincy support:yes  Additional Recommendations: Education on Hospice  Prognosis:   < 4 weeks  Discharge Planning: Home with Hospice      Primary  Diagnoses: Present on Admission: . Sepsis ([place]) . Hypercalcemia of malignancy . HCAP (healthcare-associated pneumonia) . Anemia of chronic disease . Multiple myeloma not having achieved remission ([place]) . Essential hypertension   I have reviewed the medical record, interviewed the patient and family, and examined the patient. The following aspects are pertinent.  Past Medical History:  Diagnosis Date  . Bruises easily    d/t being on COumadin  . Cholelithiasis   . DDD (degenerative disc disease), lumbar   . Dehydration 03/11/2017  . Fatty liver   . GERD (gastroesophageal reflux disease)    takes Pantoprazole daily  . Glaucoma    uses eye drops as instructed  . 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 Herpes    takes Valtrex as needed  . History of colon polyps   .  Hypercalcemia of malignancy 03/11/2017  . Hypertension    takes Amlodipine and Diovan daily  . Left radial fracture 01/21/2017  . Lung cancer ([place])    chest wall lesion aadn 6th rib  . Multiple myeloma DX  2006  STATE IIA,  IgA  KAPPA   STABLE  PER DR MOHAMED--  S/P STEM CELL TRANSPLANT 2007 /  RECURRENCE 2008  CHEMO ENDED 03-30-2008/   NOW ON MAINTANANCE REVLIMID )  . 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 Personal history of colon cancer    ASCENDING COLON--  S/P  RIGHT HEMICOLECTOMY WITH NEGATIVE NODES/    NO RECURRENCE  . Type 2 diabetes mellitus ([place])   . Vocal cord nodule    resolved- see Dr. Deeann Saint notes in media tab 06/17/14   Social History   Social History  . Marital status: Single    Spouse name: N/A  . Number of children: N/A  . Years of education: N/A   Social History Main Topics  . Smoking status: Never Smoker  . Smokeless tobacco: Never Used  . Alcohol use No  . Drug use: No  . Sexual activity: Not Currently    Birth control/ protection: Surgical   Other Topics Concern  . None   Social History Narrative  . None   Family History  Problem Relation Age of Onset  . Kidney failure Mother   . Hypertension Mother   . Prostate cancer Father 15  . Hypertension Father     vascular disease   . Stroke Father   . Hypertension Sister   . Hyperlipidemia Sister   . Hypertension Brother   . Hyperlipidemia Brother   . Hypertension Sister   . Hypertension Sister   . Colon cancer Neg Hx   . Breast cancer Neg Hx    Scheduled Meds: . aspirin EC  81 mg Oral Daily  . calcitonin  350 Units Intramuscular Q12H  . dorzolamide-timolol  1 drop Left Eye BID  . enoxaparin (LOVENOX) injection  40 mg Subcutaneous QHS  . furosemide  40 mg Intravenous BID  . gabapentin  100 mg Oral Daily  . latanoprost  1 drop Both Eyes QHS  . levofloxacin  500 mg Oral Q24H  . loratadine  10 mg Oral Daily  . mirtazapine  30 mg Oral  QHS  . multivitamin with minerals  1 tablet Oral Daily  . oxyCODONE  15 mg Oral Q12H  . pantoprazole  40 mg Oral Daily  . potassium chloride  40 mEq Oral Q4H  . predniSONE  20 mg Oral Q  breakfast   Continuous Infusions: . sodium chloride 125 mL/hr at 05/01/17 0830   PRN Meds:.labetalol, loperamide, oxyCODONE-acetaminophen, temazepam Medications Prior to Admission:  Prior to Admission medications   Medication Sig Start Date End Date Taking? Authorizing Provider  aspirin EC 81 MG tablet Take 81 mg by mouth daily.    Yes Historical Provider, MD  dexamethasone (DECADRON) 4 MG tablet TAKE 10 TABS ONCE WEEKLY  WHILE UNDERGOING VELCADE  INJECTIONS. Patient taking differently: Take 10 tablets by mouth every Monday while undergoing Velcade injections 01/29/17  Yes Curt Bears, MD  dorzolamide-timolol (COSOPT) 22.3-6.8 MG/ML ophthalmic solution Place 1 drop into the left eye 2 (two) times daily.    Yes Historical Provider, MD  gabapentin (NEURONTIN) 100 MG capsule Take 100 mg by mouth daily.   Yes Historical Provider, MD  latanoprost (XALATAN) 0.005 % ophthalmic solution Place 1 drop into both eyes at bedtime. 04/15/17  Yes Hosie Poisson, MD  levofloxacin (LEVAQUIN) 500 MG tablet Take 1 tablet (500 mg total) by mouth daily. 04/25/17  Yes Charlynne Cousins, MD  loperamide (IMODIUM) 2 MG capsule Take 2-4 mg by mouth every 2 (two) hours as needed for diarrhea or loose stools.   Yes Historical Provider, MD  loratadine (CLARITIN) 10 MG tablet TAKE ONE TABLET BY MOUTH ONCE DAILY. 04/02/17  Yes Fayrene Helper, MD  LUMIGAN 0.01 % SOLN Place 1 drop into both eyes at bedtime.    Yes Historical Provider, MD  mirtazapine (REMERON) 30 MG tablet Take 1 tablet (30 mg total) by mouth at bedtime. 04/17/17  Yes Curt Bears, MD  Multiple Vitamin (MULTIVITAMIN WITH MINERALS) TABS tablet Take 1 tablet by mouth daily.   Yes Historical Provider, MD  oxyCODONE (OXYCONTIN) 15 mg 12 hr tablet Take 1 tablet (15 mg  total) by mouth every 12 (twelve) hours. 04/25/17  Yes Charlynne Cousins, MD  oxyCODONE-acetaminophen (PERCOCET/ROXICET) 5-325 MG tablet Take 1 tablet by mouth every 4 (four) hours as needed for severe pain. May take one tablet every 4-6 hours as needed for pain 04/17/17  Yes Curt Bears, MD  pantoprazole (PROTONIX) 40 MG tablet TAKE 1 TABLET BY MOUTH  DAILY FOR ACID REFLUX 10/18/16  Yes Fayrene Helper, MD  potassium chloride SA (K-DUR,KLOR-CON) 20 MEQ tablet Take 1 tablet (20 mEq total) by mouth 2 (two) times daily. 04/25/17  Yes Charlynne Cousins, MD  predniSONE (DELTASONE) 20 MG tablet Take 1 tablet (20 mg total) by mouth daily with breakfast. 04/26/17  Yes Charlynne Cousins, MD  temazepam (RESTORIL) 15 MG capsule TAKE 1 CAPSULE AT BEDTIME AS NEEDED FOR SLEEP. 08/23/16  Yes Fayrene Helper, MD  acetaminophen (TYLENOL) 500 MG tablet Take 500 mg by mouth every 6 (six) hours as needed for mild pain, moderate pain, fever or headache.     Historical Provider, MD  pomalidomide (POMALYST) 4 MG capsule Take 1 capsule (4 mg total) by mouth daily. Take with water on days 1-21. Repeat every 28 days. 04/17/17   Curt Bears, MD  valACYclovir (VALTREX) 1000 MG tablet TAKE 1 TABLET BY MOUTH  DAILY AS NEEDED Patient taking differently: Take 1 tablet by mouth daily as needed for cold sores 03/19/17   Fayrene Helper, MD   Allergies  Allergen Reactions  . Codeine Nausea And Vomiting  . Tramadol Nausea And Vomiting   Review of Systems +back pain, generalized weakness  Physical Exam Weak appearing lady resting in bed Tearful S1 S2 Clear Abdomen soft No edema Awake alert  Vital  Signs: BP (!) 149/73   Pulse (!) 102   Temp 97.9 F (36.6 C) (Oral)   Resp 15   Ht _0  (1.676 m)   Wt 78 kg (172 lb)   SpO2 98%   BMI 27.76 kg/m  Pain Assessment: 0-10   Pain Score: 7    SpO2: SpO2: 98 % O2 Device:SpO2: 98 % O2 Flow Rate: .   IO: Intake/output summary:  Intake/Output Summary  (Last 24 hours) at 05/01/17 1453 Last data filed at 05/01/17 1030  Gross per 24 hour  Intake          2258.33 ml  Output              150 ml  Net          2108.33 ml    LBM: Last BM Date: 05/01/17 Baseline Weight: Weight: 78 kg (172 lb) Most recent weight: Weight: 78 kg (172 lb)     Palliative Assessment/Data:   Flowsheet Rows     Most Recent Value  Intake Tab  Referral Department  Hospitalist  Unit at Time of Referral  Intermediate Care Unit  Palliative Care Primary Diagnosis  Cancer  Palliative Care Type  New Palliative care  Reason for referral  Clarify Goals of Care, Counsel Regarding Hospice  Date first seen by Palliative Care  05/01/17  Clinical Assessment  Palliative Performance Scale Score  30%  Pain Max last 24 hours  6  Pain Min Last 24 hours  4  Dyspnea Max Last 24 Hours  4  Dyspnea Min Last 24 hours  3  Psychosocial & Spiritual Assessment  Palliative Care Outcomes  Patient/Family meeting held?  Yes  Who was at the meeting?  patient who is decisional.   Palliative Care Outcomes  Clarified goals of care      Time In:  1400 Time Out:  1510 Time Total:  70 min  Greater than 50%  of this time was spent counseling and coordinating care related to the above assessment and plan.  Signed by: Loistine Chance, MD  [phone] Please contact Palliative Medicine Team phone at [phone] for questions and concerns.  For individual provider: See Shea Evans

## 2017-05-01 NOTE — Care Management Note (Signed)
Case Management Note  Patient Details  Name: Darlene Howard MRN: [id number] Date of Birth: 13-Feb-1951  Subjective/Objective:            pna via ct scan of chest        Action/Plan:    From Home Will follow for dc needs.   Expected Discharge Date:   (unknown)   [id number]            Expected Discharge Plan:  [place]  In-House Referral:     Discharge planning Services  CM Consult  Post Acute Care Choice:    Choice offered to:     DME Arranged:    DME Agency:     HH Arranged:    [place] Agency:     Status of Service:  In process, will continue to follow  If discussed at Long Length of Stay Meetings, dates discussed:    Additional Comments:  Leeroy Cha, RN 05/01/2017, 10:20 AM

## 2017-05-02 DIAGNOSIS — D638 Anemia in other chronic diseases classified elsewhere: Secondary | ICD-10-CM

## 2017-05-02 LAB — CBC
HEMATOCRIT: 31.3 % — AB (ref 36.0–46.0)
HEMOGLOBIN: 10.2 g/dL — AB (ref 12.0–15.0)
MCH: 30.6 pg (ref 26.0–34.0)
MCHC: 32.6 g/dL (ref 30.0–36.0)
MCV: 94 fL (ref 78.0–100.0)
Platelets: 147 10*3/uL — ABNORMAL LOW (ref 150–400)
RBC: 3.33 MIL/uL — AB (ref 3.87–5.11)
RDW: 19.3 % — ABNORMAL HIGH (ref 11.5–15.5)
WBC: 16.9 10*3/uL — ABNORMAL HIGH (ref 4.0–10.5)

## 2017-05-02 LAB — URINE CULTURE: Culture: NO GROWTH

## 2017-05-02 LAB — BASIC METABOLIC PANEL
ANION GAP: 12 (ref 5–15)
BUN: 7 mg/dL (ref 6–20)
CHLORIDE: 104 mmol/L (ref 101–111)
CO2: 27 mmol/L (ref 22–32)
Calcium: 11.4 mg/dL — ABNORMAL HIGH (ref 8.9–10.3)
Creatinine, Ser: 0.66 mg/dL (ref 0.44–1.00)
GFR calc non Af Amer: >60 mL/min (ref >60)
GLUCOSE: 79 mg/dL (ref 65–99)
Potassium: 3.6 mmol/L (ref 3.5–5.1)
Sodium: 143 mmol/L (ref 135–145)

## 2017-05-02 NOTE — Progress Notes (Addendum)
[facility] Liaison:  RN visit  Notified by Suanne Marker [facility], of patient/family request for [facility] services at home after discharge.  Chart and patient information are being reviewed with Dr. Brigid Re, [place] Director.  Hospice eligibility is pending at this time.  Writer spoke with patient and family at bedside to initiate education related to hospice philosophy, services and team approach to care.  Patient/family verbalized understanding of information given.  Per discussion, plan is for discharge to home by [facility] 05/02/17.  Please send signed and completed DNR form home with patient/family.  Patient will need prescriptions for discharge comfort medications.  DME needs have been discussed and patient has the following equipment already in the home:  Hospital bed, wheelchair, walker and BSC.  Patient/family declined any DME at this time, but would like to assess when patient gets home to reevaluate home situation.    [facility] is aware of the above.  Completed discharge summary will need to be faxed to [facility] at  [phone], when final.  Please notify [facility] when patient is ready to leave the unit at discharge.  Call [phone] or [phone] if after 5pm.  [facility] information and contact numbers have been given to the family during visit.  Above information shared with Suanne Marker, [facility].  Please call with any hospice related questions or concerns.  Thank you for the referral.  Edyth Gunnels, RN, BSN [facility] Liaison [phone]  All hospital liaisons are now on [place].  **UPDATE:  Patient is eligible.  Contacted Rhonda, CMRN to advise of same.

## 2017-05-02 NOTE — Discharge Summary (Signed)
Physician Discharge Summary  Darlene Howard KPT:[id number] DOB: December 27, 1951 DOA: 04/30/2017  PCP: [name], MD  Admit date: 04/30/2017 Discharge date: 05/02/2017  Admitted From: home Disposition:  home   Recommendations for Outpatient Follow-up:  1. Will go home with hospice services - may need to go to hospice home if she continues to decline and family is unable to care for her  Discharge Condition:  fair   CODE STATUS:  DNR   Diet recommendation:  Regular diet as tolerated Consultations:  Palliative care  Phone consult with Dr [name]     Discharge Diagnoses:  Principal Problem:   Hypercalcemia of malignancy Active Problems:   Essential hypertension   Multiple myeloma not having achieved remission ([place])   Anemia of chronic disease   HCAP (healthcare-associated pneumonia)   Sepsis ([place])   Sinus tachycardia    Subjective: Mild pain in legs. Overall controlled with current medications. Appetite poor. No new symptoms.   Brief Summary: Darlene Howard a 66 y.o.woman with a history of multiple myeloma (Followed by Dr. Earlie Server but she has also been seen at [facility]), colon cancer S/P right hemicolectomy, pre-diabetes, HTN, GERD, AOCD (malignancy), and hypercalcemia of malignancy who presents to the ED after being notified that her calcium level was 13 and required treatment. She had a recent admission for sepsis secondary to HCAP from 4/19 - 4/28. During that time, she also had hypercalcemia, and she received Zometa on 4/25.   She is essentially bed bound now and is barely eating and drinking. Her family states she has declined rapidly.   Hospital Course:  Principal Problem:   Hypercalcemia of malignancy/   Multiple myeloma not having achieved remission - corrected calcium on admission 14.2 - as mentioned, had just received Zometa on 4/25 and was on Prednisone to help with hypercalcemia - given Calcitonin, IVF and Lasix - discussed plan with Dr [name] 5/2 - he  recommends another dose of Zometa and palliative care consult as he does not feel he can further treat her cancer- she was given a prescription for Pomalidomide but has not yet started taking it - Calcium only improved to 13.4 today with above treatments - Palliative care consulted- she has been set up go go home with hospice following- her pain is well controlled on current medications which she already has at home  Active Problems:    HCAP (healthcare-associated pneumonia)   - Initially treated with Zosyn, Vanc and Levaquin given high risk for MDR - MRSA PCR negative, Vanco d/c'd, Received 5 days of Zosyn d/ced on 04/24/17 - changed to oral Levaquin which she was discharged on - appears asymptomatic- Right perihilar infiltrate on CT, leukocytosis, lactic acidosis (could be due to dehydration from hypercalcemia) - resumed Levaquin in hospital   Hypokalemia, hypomagnesemia - replaced  Hypernatremia - improved with 1/2 NS    Sinus tachycardia - was present on prior admission as well    Essential hypertension Per family, anti-hypertensives actually discontinued 1-2 months ago for relative hypotension. - on PRN labetalol in hospital  Leukocytosis - HCAP vs due to stress demargination     Anemia of chronic disease - Hb stable  Discharge Instructions  Discharge Instructions    Diet general    Complete by:  As directed    Regular diet as tolerated     Allergies as of 05/02/2017      Reactions   Codeine Nausea And Vomiting   Tramadol Nausea And Vomiting      Medication List  STOP taking these medications   dexamethasone 4 MG tablet Commonly known as:  DECADRON   levofloxacin 500 MG tablet Commonly known as:  LEVAQUIN   pomalidomide 4 MG capsule Commonly known as:  POMALYST     TAKE these medications   acetaminophen 500 MG tablet Commonly known as:  TYLENOL Take 500 mg by mouth every 6 (six) hours as needed for mild pain, moderate pain, fever or headache.    aspirin EC 81 MG tablet Take 81 mg by mouth daily.   dorzolamide-timolol 22.3-6.8 MG/ML ophthalmic solution Commonly known as:  COSOPT Place 1 drop into the left eye 2 (two) times daily.   gabapentin 100 MG capsule Commonly known as:  NEURONTIN Take 100 mg by mouth daily.   latanoprost 0.005 % ophthalmic solution Commonly known as:  XALATAN Place 1 drop into both eyes at bedtime.   loperamide 2 MG capsule Commonly known as:  IMODIUM Take 2-4 mg by mouth every 2 (two) hours as needed for diarrhea or loose stools.   loratadine 10 MG tablet Commonly known as:  CLARITIN TAKE ONE TABLET BY MOUTH ONCE DAILY.   LUMIGAN 0.01 % Soln Generic drug:  bimatoprost Place 1 drop into both eyes at bedtime.   mirtazapine 30 MG tablet Commonly known as:  REMERON Take 1 tablet (30 mg total) by mouth at bedtime.   multivitamin with minerals Tabs tablet Take 1 tablet by mouth daily.   oxyCODONE 15 mg 12 hr tablet Commonly known as:  OXYCONTIN Take 1 tablet (15 mg total) by mouth every 12 (twelve) hours.   oxyCODONE-acetaminophen 5-325 MG tablet Commonly known as:  PERCOCET/ROXICET Take 1 tablet by mouth every 4 (four) hours as needed for severe pain. May take one tablet every 4-6 hours as needed for pain   pantoprazole 40 MG tablet Commonly known as:  PROTONIX TAKE 1 TABLET BY MOUTH  DAILY FOR ACID REFLUX   potassium chloride SA 20 MEQ tablet Commonly known as:  K-DUR,KLOR-CON Take 1 tablet (20 mEq total) by mouth 2 (two) times daily.   predniSONE 20 MG tablet Commonly known as:  DELTASONE Take 1 tablet (20 mg total) by mouth daily with breakfast.   temazepam 15 MG capsule Commonly known as:  RESTORIL TAKE 1 CAPSULE AT BEDTIME AS NEEDED FOR SLEEP.   valACYclovir 1000 MG tablet Commonly known as:  VALTREX TAKE 1 TABLET BY MOUTH  DAILY AS NEEDED What changed:  See the new instructions.      Follow-up Information    [name], MD Follow up.   Specialty:  Family  Medicine Why:  as needed Contact information: [address] [place] [address] [phone]          Allergies  Allergen Reactions  . Codeine Nausea And Vomiting  . Tramadol Nausea And Vomiting     Procedures/Studies:    Dg Chest 2 View  Result Date: 04/19/2017 CLINICAL DATA:  Acute onset of fever. Decreased O2 saturation. Initial encounter. EXAM: CHEST  2 VIEW COMPARISON:  Chest radiograph performed 04/12/2017 FINDINGS: The lungs are well-aerated. Increasing density of right perihilar airspace opacification raises concern for worsening pneumonia. Small bilateral pleural effusions are noted. No pneumothorax is seen. Chronic bilateral rib deformities are again noted, likely reflecting the patient's multiple myeloma. The heart is mildly enlarged. No acute osseous abnormalities are seen. There is chronic degenerative change at both glenohumeral joints. IMPRESSION: 1. Increasing density of right perihilar airspace opacification raises concern for worsening pneumonia. Followup PA and  lateral chest X-ray is recommended in 3-4 weeks following trial of antibiotic therapy to ensure resolution and exclude underlying malignancy. 2. Small bilateral pleural effusions. 3. Mild cardiomegaly. Electronically Signed   By: Garald Balding M.D.   On: 04/19/2017 00:26   Dg Chest 2 View  Result Date: 04/12/2017 CLINICAL DATA:  Fevers and recent pneumonia, history of myeloma EXAM: CHEST  2 VIEW COMPARISON:  04/03/2017 FINDINGS: Cardiac shadow is stable. The lungs are well aerated bilaterally. Old rib fractures are again seen bilaterally with interval healing. Fullness is again noted in the right suprahilar region consistent with focal infiltrate. No new mass lesion is seen. IMPRESSION: Stable appearing right suprahilar infiltrate. Chronic changes consistent with the known history of multiple myeloma. Electronically Signed   By: Inez Catalina M.D.   On: 04/12/2017 12:27   Dg Chest 2 View  Result  Date: 04/03/2017 CLINICAL DATA:  Awakened today with increase cough an uncontrollable shaking. History of multiple myeloma on chemotherapy. EXAM: CHEST  2 VIEW COMPARISON:  Chest x-ray of April 25th 25 teen FINDINGS: The lungs are mildly hypoinflated. There is confluent alveolar opacity in the right perihilar region more conspicuous than on the previous study. This appears to reflect new infiltrate superimposed upon a posterior chest wall soft tissue mass. There are lateral left rib deformities which have appeared since the previous study. The cardiac silhouette is enlarged. The pulmonary vascularity is not engorged. There degenerative changes of both shoulders. The thoracic vertebral bodies are preserved in height. IMPRESSION: New right upper lobe pneumonia extending to the hilar region. Followup PA and lateral chest X-ray is recommended in 3-4 weeks following trial of antibiotic therapy to ensure resolution and exclude underlying malignancy. Persistent posterior right mid thoracic pleural based mass consistent with myelomatous involvement. New rib deformity on the left laterally involving the fifth and sixth ribs consistent with myelomatous involvement. Electronically Signed   By: David  Martinique M.D.   On: 04/03/2017 09:55   Ct Angio Chest Pe W And/or Wo Contrast  Result Date: 04/30/2017 CLINICAL DATA:  Weakness. Abnormal blood calcium. History of multiple myeloma. EXAM: CT ANGIOGRAPHY CHEST WITH CONTRAST TECHNIQUE: Multidetector CT imaging of the chest was performed using the standard protocol during bolus administration of intravenous contrast. Multiplanar CT image reconstructions and MIPs were obtained to evaluate the vascular anatomy. CONTRAST:  100 mL of Isovue 370 COMPARISON:  Chest CT April 25, 2016.  Multiple recent chest x-rays. FINDINGS: Cardiovascular: The heart is unchanged. The thoracic aorta is normal in caliber. No dissection. The main pulmonary artery measures up to 3.8 cm, mildly enlarged but  similar in the interval. Evaluation of pulmonary arteries is limited due to respiratory motion and right-sided pulmonary opacities. Within these limitations, no pulmonary emboli are identified. Mediastinum/Nodes: No axillary adenopathy. There is adenopathy in the base of the neck bilaterally which is new. A representative node on series 4, image 1 measures 2.2 cm in diameter an crosses the thoracic inlet. Mediastinal adenopathy is also worse in the interval with nodes in the prevascular space, left paratracheal region, and in the left periaortic region with numerous nodes adjacent to the descending thoracic aorta. At least 1 abnormal node is seen between the esophagus and descending aorta. Retrocrural nodes are identified, also enlarged. The thyroid is normal. The esophagus itself is unremarkable. The pleural effusions. No significant pericardial effusion. Lungs/Pleura: A nodule in the right upper lobe on series 7, image 24 measures 7 mm today versus 5 mm previously. No other pulmonary nodules. Focal opacity  centered in the right perihilar region with air bronchograms is identified. Moderate effusions bilaterally with associated atelectasis. Mild pleural thickening not excluded laterally on the right on series 7, image 46 versus pleural fluid. Upper Abdomen: There appears to be a rounded low-attenuation lesion in the upper liver measuring up to 2.5 cm. This was not a 48 seen previously. Another low-attenuation lesion is seen in the right hepatic lobe on image 74 and a third lesion may be seen in the left hepatic lobe on image 73. Thickening of the left adrenal gland is new and suspicious. A tiny nodule in the right adrenal gland is stable. No other abnormalities in the upper abdomen. Musculoskeletal: The previously identified right posterior sixth rib metastasis has been replaced by a bony sclerosis. Numerous active bony metastases are seen however. 2 destructive metastases are seen in the right fourth rib.  Metastases however are seen in multiple ribs. Bony destruction is seen of the right posterior ninth rib with a soft tissue nodule. Active bony destruction by soft tissue nodules seen on lateral left rib on image 42. Another is seen on image 56. Bony destruction is seen in the anterior tenth thoracic vertebral body. A rim of tissue is seen at the right lateral aspects of T9 and T10 consistent with extraosseous extension of bony metastases. A metastasis is seen at the superior endplate of L1. Clavicular lesion are seen on the left. Left-sided scapular lesions are identified. A destructive lesion is seen in the ninth thoracic vertebral body right transverse process. Review of the MIP images confirms the above findings. IMPRESSION: 1. No pulmonary emboli identified. 2. Focal right perihilar infiltrate with air bronchograms. Unless the patient has had radiation therapy to the chest, these findings are most consistent with pneumonia. Recommend follow-up to resolution. 3. Worsening metastatic disease in the chest. A nodule in the right upper lobe is larger in the interval. Adenopathy in the base of the neck in the mediastinum has significantly worsened as well. While the largest metastatic lesion to the right posterior sixth rib and has improved, the remainder of the bony and involvement has worsened. 4. Three low-attenuation lesions in the liver worrisome for metastatic disease. 5. New nodularity in the left adrenal gland is suggestive of metastatic disease as well. 6. New bilateral moderate pleural effusions. Electronically Signed   By: Dorise Bullion III M.D   On: 04/30/2017 19:58   Mr Lumbar Spine W Wo Contrast  Result Date: 04/18/2017 CLINICAL DATA:  67 year old female with multiple myeloma. Increased fatigue. Possible cord or nerve compression. EXAM: MRI LUMBAR SPINE WITHOUT AND WITH CONTRAST TECHNIQUE: Multiplanar and multiecho pulse sequences of the lumbar spine were obtained without and with intravenous  contrast. CONTRAST:  63m MULTIHANCE GADOBENATE DIMEGLUMINE 529 MG/ML IV SOLN COMPARISON:  Skeletal survey 03/15/2017. CT Abdomen and Pelvis 04/24/2016. Lumbar MRI 03/01/2014 FINDINGS: Segmentation: Normal is demonstrated on the recent radiographs and this corresponds to the same numbering system used in 2015. Alignment: Chronic grade 1 anterolisthesis of L4 on L5 is stable since 2015. Chronic L2 superior endplate compression fracture is stable. A mild L1 superior endplate compression fracture has occurred since 2015, and is associated with subtotal involvement of the superior L1 body by a tumor. Stable vertebral height and alignment elsewhere. Vertebrae: Disseminated multiple myeloma throughout the visible spine and pelvis. Associated widespread heterogeneous enhancement. Diffuse involvement of the visible spine, including multiple levels of posterior element involvement. There are areas of significant extraosseous extension of tumor, most notably laterally from the L3 vertebral  body as demonstrated on series 5, image 26. However, there is no epidural or neural foraminal extension of tumor identified. Conus medullaris: Extends to the L1-L2 level and appears normal. No abnormal intradural enhancement. Paraspinal and other soft tissues: Bulky retrocrural lymphadenopathy. Negative visualized abdominal viscera. Low level edema in the bilateral erector spinae muscles. Disc levels: Superimposed chronic severe disc and posterior element degeneration in the lumbar spine: L2-L3 moderate spinal stenosis in part related to bulky posterior disc extrusion is stable since 2015. Moderate to severe left L2 foraminal stenosis is stable. L3-L4 moderate multifactorial spinal and bilateral lateral recess stenosis is mildly improved since 2015. L4-L5 multifactorial severe spinal and lateral recess stenosis has mildly progressed since 2015. Moderate to severe left L4 foraminal stenosis is stable. L5-S1 multifactorial severe lateral  recess and mild to moderate spinal stenosis is stable since 2015. Moderate to severe right L5 foraminal stenosis is stable. IMPRESSION: 1. Disseminated multiple myeloma throughout the visible spine and pelvis. Mild pathologic compression fracture of the L1 superior endplate could be acute or subacute. Mild L2 superior endplate compression fracture is chronic. 2. Areas of significant extraosseous extension of tumor, including into the right lateral paraspinal soft tissues at L3, but there is no epidural or neural foraminal extension. And thus no malignant neural impingement is identified. 3. Superimposed multilevel chronic degenerative moderate and severe spinal stenosis and neural impingement in the lumbar spine has not significantly changed overall since 2015. 4. Bulky retrocrural metastatic lymphadenopathy. Electronically Signed   By: Genevie Ann M.D.   On: 04/18/2017 13:21   Dg Chest Port 1 View  Result Date: 04/30/2017 CLINICAL DATA:  Weakness. EXAM: PORTABLE CHEST 1 VIEW COMPARISON:  04/19/2017 as well as chest CT 04/25/2016 FINDINGS: Lungs are adequately inflated demonstrate persistent right perihilar opacification with no change to slight improvement likely due in part to right posterior chest wall myeloma. Left lung is clear. Cardiac silhouette is within normal. Left lateral rib fractures unchanged likely pathologic in may be related to patient's known myeloma. Remainder of the exam is unchanged. IMPRESSION: No acute findings. Stable opacification of the right perihilar region which may be due in part to known right posterior chest wall myeloma. Stable left lateral rib fractures likely pathologic due to known myeloma. Electronically Signed   By: Marin Olp M.D.   On: 04/30/2017 18:02   Dg Swallowing Func-speech Pathology  Result Date: 04/04/2017 Objective Swallowing Evaluation: Type of Study: Bedside Swallow Evaluation Patient Details Name: JAHAYRA MAZO MRN: [id number] Date of Birth: 1951/11/17 Today's  Date: 04/04/2017 Time: SLP Start Time (ACUTE ONLY): 1418-SLP Stop Time (ACUTE ONLY): 1433 SLP Time Calculation (min) (ACUTE ONLY): 15 min Past Medical History: Past Medical History: Diagnosis Date . Bruises easily   d/t being on COumadin . Cholelithiasis  . DDD (degenerative disc disease), lumbar  . Dehydration 03/11/2017 . Fatty liver  . GERD (gastroesophageal reflux disease)   takes Pantoprazole daily . Glaucoma   uses eye drops as instructed . 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 Herpes   takes Valtrex as needed . History of colon polyps  . Hypercalcemia of malignancy 03/11/2017 . Hypertension   takes Amlodipine and Diovan daily . Left radial fracture 01/21/2017 . Lung cancer ([place])   chest wall lesion aadn 6th rib . Multiple myeloma DX  2006  STATE IIA,  IgA  KAPPA  STABLE  PER DR MOHAMED--  S/P STEM CELL TRANSPLANT 2007 /  RECURRENCE 2008  CHEMO ENDED 03-30-2008/  NOW ON MAINTANANCE REVLIMID ) . Neuropathy associated with multiple myeloma ([facility])   POST CHEMOTHERAPY AND STEM CELL TRANSPLANT . Personal history of colon cancer   ASCENDING COLON--  S/P  RIGHT HEMICOLECTOMY WITH NEGATIVE NODES/    NO RECURRENCE . Type 2 diabetes mellitus ([place])  . Vocal cord nodule   resolved- see Dr. Deeann Saint notes in media tab 06/17/14 Past Surgical History: Past Surgical History: Procedure Laterality Date . ABDOMINAL HYSTERECTOMY  1983  w/  appendectomy and left salpingoophorectom . CATARACT EXTRACTION W/PHACO Left 01/20/2014  Procedure: CATARACT EXTRACTION PHACO AND INTRAOCULAR LENS PLACEMENT (IOC);  Surgeon: Adonis Brook, MD;  Location: [place];  Service: Ophthalmology;  Laterality: Left; . CERVICAL CONE BIOPSY   . CHOLECYSTECTOMY N/A 02/28/2016  Procedure: LAPAROSCOPIC CHOLECYSTECTOMY;  Surgeon: Ralene Ok, MD;  Location: [facility];  Service: General;  Laterality: N/A; . CO2  ABLATION VULVA PERIANAL AND VAGINAL DYSPLATIC AREAS  02-15-2010 . COLONOSCOPY  10/07/2006  [id number] rectum/Status post right hemicolectomy. Normal  residual colon . COLONOSCOPY  10/2009  RMR: diminutive RS polyp (hyperplastic).  . COLONOSCOPY N/A 12/15/2014  Procedure: COLONOSCOPY;  Surgeon: Daneil Dolin, MD;  [facility];  Service: Endoscopy;  Laterality: N/A;  130  . ESOPHAGOGASTRODUODENOSCOPY  04/02/2005  [id number] polyp with central erosion versus ulceration in the body of the stomach, resected and recovered Remainder of the gastric mucosa, D1, D2 appeared normal . HEMICOLECTOMY Right 04-27-2005  CARCINOMA   ASCENDING COLON . LESION REMOVAL N/A 05/06/2014  Procedure: LASER OF THE VAGINA;  Surgeon: Janie Morning, MD;  Location: [facility];  Service: Gynecology;  Laterality: N/A; . LIMBAL STEM CELL TRANSPLANT  04-26-2006    ([facility]) . VULVAR LESION REMOVAL N/A 05/06/2014  Procedure: LASER OF VULVAR LESION;  Surgeon: Janie Morning, MD;  Location: [facility];  Service: Gynecology;  Laterality: N/A; . VULVECTOMY N/A 05/06/2014  Procedure: WIDE LOCAL EXCISION LEFT LABIA MAJORA;  Surgeon: Janie Morning, MD;  Location: [facility];  Service: Gynecology;  Laterality: N/A; HPI:  66 y.o. 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  Pt found to have right UL pna, concerning for aspiration.  MD ordered MBS.   No Data Recorded Assessment / Plan / Recommendation CHL IP CLINICAL IMPRESSIONS 04/04/2017 Clinical Impression Functional oropharyngeal swallow ability without aspiration/ frank penetration of any consistency tested.  Pt has strong timely swallow without residuals.  Of note, pt did cough x1 during testing.  Upon esophageal sweep at end of testing, pt appeared with slow clearance distally without awareness.  Water consumption faciliated clearance. Pt may benefit from dedicated  esophageal evaluation if MD indicates.  Using live video, educated pt and her sister [name] to findings.  Thanks for this referral. NO SLP follow up indicated.  SLP Visit Diagnosis Dysphagia, unspecified (R13.10) Attention and concentration deficit following -- Frontal lobe and executive function deficit following -- Impact on safety and function Mild aspiration risk   No flowsheet data found.  No flowsheet data found. CHL IP DIET RECOMMENDATION 04/04/2017 SLP Diet Recommendations Regular solids;Thin liquid Liquid Administration via Cup;Straw Medication Administration Whole meds with liquid Compensations Slow rate;Small sips/bites Postural Changes Remain semi-upright after after feeds/meals (Comment);Seated upright at 90 degrees   CHL IP OTHER RECOMMENDATIONS 04/04/2017 Recommended Consults Consider esophageal assessment Oral Care Recommendations Oral  care BID Other Recommendations --   CHL IP FOLLOW UP RECOMMENDATIONS 04/04/2017 Follow up Recommendations None   No flowsheet data found.     CHL IP ORAL PHASE 04/04/2017 Oral Phase WFL Oral - Pudding Teaspoon -- Oral - Pudding Cup -- Oral - Honey Teaspoon -- Oral - Honey Cup -- Oral - Nectar Teaspoon -- Oral - Nectar Cup WFL Oral - Nectar Straw -- Oral - Thin Teaspoon -- Oral - Thin Cup WFL Oral - Thin Straw WFL Oral - Puree WFL Oral - Mech Soft -- Oral - Regular WFL Oral - Multi-Consistency -- Oral - Pill WFL Oral Phase - Comment --  CHL IP PHARYNGEAL PHASE 04/04/2017 Pharyngeal Phase WFL Pharyngeal- Pudding Teaspoon -- Pharyngeal -- Pharyngeal- Pudding Cup -- Pharyngeal -- Pharyngeal- Honey Teaspoon -- Pharyngeal -- Pharyngeal- Honey Cup -- Pharyngeal -- Pharyngeal- Nectar Teaspoon -- Pharyngeal -- Pharyngeal- Nectar Cup WFL Pharyngeal -- Pharyngeal- Nectar Straw -- Pharyngeal -- Pharyngeal- Thin Teaspoon -- Pharyngeal -- Pharyngeal- Thin Cup WFL;Penetration/Aspiration during swallow Pharyngeal Material enters airway, remains ABOVE vocal cords then ejected out Pharyngeal- Thin  Straw WFL Pharyngeal -- Pharyngeal- Puree WFL Pharyngeal -- Pharyngeal- Mechanical Soft -- Pharyngeal -- Pharyngeal- Regular WFL Pharyngeal -- Pharyngeal- Multi-consistency -- Pharyngeal -- Pharyngeal- Pill WFL Pharyngeal -- Pharyngeal Comment --  CHL IP CERVICAL ESOPHAGEAL PHASE 04/04/2017 Cervical Esophageal Phase WFL Pudding Teaspoon -- Pudding Cup -- Honey Teaspoon -- Honey Cup -- Nectar Teaspoon -- Nectar Cup -- Nectar Straw -- Thin Teaspoon -- Thin Cup -- Thin Straw -- Puree -- Mechanical Soft -- Regular -- Multi-consistency -- Pill -- Cervical Esophageal Comment -- No flowsheet data found. Janett Labella [place], [facility] [place]                   Discharge Exam: Vitals:   05/01/17 2139 05/02/17 0547  BP: 140/68 (!) 160/93  Pulse: (!) 117 (!) 116  Resp: 16 16  Temp: 98.1 F (36.7 C) 98 F (36.7 C)   Vitals:   05/01/17 1600 05/01/17 1728 05/01/17 2139 05/02/17 0547  BP: 121/63 (!) 163/97 140/68 (!) 160/93  Pulse: (!) 103 (!) 111 (!) 117 (!) 116  Resp: '13 15 16 16  ' Temp:  97.9 F (36.6 C) 98.1 F (36.7 C) 98 F (36.7 C)  TempSrc:  Oral Oral Oral  SpO2: 98% 96% 97% 99%  Weight:      Height:        General: Pt is alert, awake, not in acute distress Cardiovascular: RRR, S1/S2 +, no rubs, no gallops Respiratory: CTA bilaterally, no wheezing, no rhonchi Abdominal: Soft, NT, ND, bowel sounds + Extremities: no edema, no cyanosis    The results of significant diagnostics from this hospitalization (including imaging, microbiology, ancillary and laboratory) are listed below for reference.     Microbiology: Recent Results (from the past 240 hour(s))  MRSA PCR Screening     Status: None   Collection Time: 04/22/17  8:53 AM  Result Value Ref Range Status   MRSA by PCR NEGATIVE NEGATIVE Final    Comment:        The GeneXpert MRSA Assay (FDA approved for NASAL specimens only), is one component of a comprehensive MRSA colonization surveillance program. It is  not intended to diagnose MRSA infection nor to guide or monitor treatment for MRSA infections.   Culture, blood (Routine X 2) w Reflex to ID Panel     Status: None (Preliminary result)   Collection Time: 04/30/17  5:35 PM  Result Value Ref Range Status   Specimen Description BLOOD BLOOD RIGHT FOREARM  Final   Special Requests   Final    BOTTLES DRAWN AEROBIC AND ANAEROBIC Blood Culture adequate volume   Culture   Final    NO GROWTH < 12 HOURS Performed at [facility], [address], [place], [place] [address]    Report Status PENDING  Incomplete  Culture, blood (Routine X 2) w Reflex to ID Panel     Status: None (Preliminary result)   Collection Time: 04/30/17  5:50 PM  Result Value Ref Range Status   Specimen Description BLOOD LEFT HAND  Final   Special Requests IN PEDIATRIC BOTTLE Blood Culture adequate volume  Final   Culture   Final    NO GROWTH < 12 HOURS Performed at [facility], [place] [address], [place], [place] [address]    Report Status PENDING  Incomplete  MRSA PCR Screening     Status: None   Collection Time: 04/30/17 10:53 PM  Result Value Ref Range Status   MRSA by PCR NEGATIVE NEGATIVE Final    Comment:        The GeneXpert MRSA Assay (FDA approved for NASAL specimens only), is one component of a comprehensive MRSA colonization surveillance program. It is not intended to diagnose MRSA infection nor to guide or monitor treatment for MRSA infections.      Labs: BNP (last 3 results)  Recent Labs  04/20/17 2057  BNP 387.5*   Basic Metabolic Panel:  Recent Labs Lab 04/30/17 1702 05/01/17 0334 05/02/17 0603  NA 142 147* 143  K 3.7 3.0* 3.6  CL 108 111 104  CO2 '23 25 27  ' GLUCOSE 153* 89 79  BUN '10 8 7  ' CREATININE 0.68 0.68 0.66  CALCIUM 12.9* 12.2* 11.4*  MG 1.7 1.5*  --    Liver Function Tests:  Recent Labs Lab 04/30/17 1702  AST 31  ALT 13*  ALKPHOS 63  BILITOT 0.6  PROT 8.5*  ALBUMIN 2.4*   No results for  input(s): LIPASE, AMYLASE in the last 168 hours. No results for input(s): AMMONIA in the last 168 hours. CBC:  Recent Labs Lab 04/30/17 1702 05/01/17 0334 05/02/17 0603  WBC 14.7* 15.2* 16.9*  NEUTROABS 12.6*  --   --   HGB 11.0* 10.3* 10.2*  HCT 34.6* 32.5* 31.3*  MCV 95.3 93.9 94.0  PLT 146* 147* 147*   Cardiac Enzymes: No results for input(s): CKTOTAL, CKMB, CKMBINDEX, TROPONINI in the last 168 hours. BNP: Invalid input(s): POCBNP CBG: No results for input(s): GLUCAP in the last 168 hours. D-Dimer No results for input(s): DDIMER in the last 72 hours. Hgb A1c No results for input(s): HGBA1C in the last 72 hours. Lipid Profile No results for input(s): CHOL, HDL, LDLCALC, TRIG, CHOLHDL, LDLDIRECT in the last 72 hours. Thyroid function studies No results for input(s): TSH, T4TOTAL, T3FREE, THYROIDAB in the last 72 hours.  Invalid input(s): FREET3 Anemia work up No results for input(s): VITAMINB12, FOLATE, FERRITIN, TIBC, IRON, RETICCTPCT in the last 72 hours. Urinalysis    Component Value Date/Time   COLORURINE YELLOW 04/19/2017 0500   APPEARANCEUR CLOUDY (A) 04/19/2017 0500   LABSPEC 1.025 04/19/2017 0500   PHURINE 5.0 04/19/2017 0500   GLUCOSEU NEGATIVE 04/19/2017 0500   HGBUR SMALL (A) 04/19/2017 0500   BILIRUBINUR NEGATIVE 04/19/2017 0500   BILIRUBINUR neg 03/09/2015 1333   KETONESUR NEGATIVE 04/19/2017 0500   PROTEINUR 100 (A) 04/19/2017 0500   UROBILINOGEN  0.2 03/09/2015 1333   NITRITE NEGATIVE 04/19/2017 0500   LEUKOCYTESUR MODERATE (A) 04/19/2017 0500   Sepsis Labs Invalid input(s): PROCALCITONIN,  WBC,  LACTICIDVEN Microbiology Recent Results (from the past 240 hour(s))  MRSA PCR Screening     Status: None   Collection Time: 04/22/17  8:53 AM  Result Value Ref Range Status   MRSA by PCR NEGATIVE NEGATIVE Final    Comment:        The GeneXpert MRSA Assay (FDA approved for NASAL specimens only), is one component of a comprehensive MRSA  colonization surveillance program. It is not intended to diagnose MRSA infection nor to guide or monitor treatment for MRSA infections.   Culture, blood (Routine X 2) w Reflex to ID Panel     Status: None (Preliminary result)   Collection Time: 04/30/17  5:35 PM  Result Value Ref Range Status   Specimen Description BLOOD BLOOD RIGHT FOREARM  Final   Special Requests   Final    BOTTLES DRAWN AEROBIC AND ANAEROBIC Blood Culture adequate volume   Culture   Final    NO GROWTH < 12 HOURS Performed at [facility], [place] [address], [place], [place] [address]    Report Status PENDING  Incomplete  Culture, blood (Routine X 2) w Reflex to ID Panel     Status: None (Preliminary result)   Collection Time: 04/30/17  5:50 PM  Result Value Ref Range Status   Specimen Description BLOOD LEFT HAND  Final   Special Requests IN PEDIATRIC BOTTLE Blood Culture adequate volume  Final   Culture   Final    NO GROWTH < 12 HOURS Performed at [facility], [place] [address] [place], [place] [address]    Report Status PENDING  Incomplete  MRSA PCR Screening     Status: None   Collection Time: 04/30/17 10:53 PM  Result Value Ref Range Status   MRSA by PCR NEGATIVE NEGATIVE Final    Comment:        The GeneXpert MRSA Assay (FDA approved for NASAL specimens only), is one component of a comprehensive MRSA colonization surveillance program. It is not intended to diagnose MRSA infection nor to guide or monitor treatment for MRSA infections.      Time coordinating discharge: Over 30 minutes  SIGNED:   Debbe Odea, MD  [facility] 05/02/2017, 8:45 AM Pager   If 7PM-7AM, please contact night-coverage [email or web address] Password [email or web address]

## 2017-05-02 NOTE — Progress Notes (Signed)
[id number] Rosana Hoes, BSN, [place], [phone] Chart review for patient progression. Chart review for case management needs:  Hospice and palliative care [facility] called.  Left message for her to call back for home hospice care for patient. Next review due on [id number].

## 2017-05-02 NOTE — Progress Notes (Signed)
Date: May 02, 2017 Discharge orders review for case management needs.  Home with [facility] of [facility]/no eqip. Needs/transport via [facility]. Velva Harman, BSN, [facility], [place]: [phone]

## 2017-05-02 NOTE — Discharge Instructions (Signed)
You will be going home with hospice services.

## 2017-05-03 ENCOUNTER — Telehealth: Payer: Self-pay

## 2017-05-03 ENCOUNTER — Encounter: Payer: Self-pay | Admitting: Pharmacist

## 2017-05-03 LAB — CALCIUM, IONIZED: Calcium, Ionized, Serum: 6.8 mg/dL — ABNORMAL HIGH (ref 4.5–5.6)

## 2017-05-03 NOTE — Telephone Encounter (Signed)
Call from [facility]. She talked with pt's brother and had cancelled all appts d/t pt is under hospice care.

## 2017-05-05 LAB — CULTURE, BLOOD (ROUTINE X 2)
CULTURE: NO GROWTH
CULTURE: NO GROWTH
Special Requests: ADEQUATE
Special Requests: ADEQUATE

## 2017-05-06 ENCOUNTER — Ambulatory Visit: Payer: Medicare Other | Admitting: Internal Medicine

## 2017-05-06 ENCOUNTER — Ambulatory Visit: Payer: Medicare Other

## 2017-05-06 ENCOUNTER — Other Ambulatory Visit: Payer: Medicare Other

## 2017-05-07 ENCOUNTER — Ambulatory Visit: Payer: Medicare Other

## 2017-05-07 ENCOUNTER — Other Ambulatory Visit: Payer: Medicare Other

## 2017-05-08 ENCOUNTER — Telehealth: Payer: Self-pay

## 2017-05-08 NOTE — Telephone Encounter (Signed)
Sherry from hospice has called to update you on Darlene Howard condition, also asking for a pain med increase. Her number is [phone].

## 2017-05-08 NOTE — Telephone Encounter (Signed)
I spoke directly with hospice nurse

## 2017-05-13 ENCOUNTER — Ambulatory Visit: Payer: Medicare Other

## 2017-05-13 ENCOUNTER — Other Ambulatory Visit: Payer: Medicare Other

## 2017-05-14 ENCOUNTER — Telehealth: Payer: Self-pay

## 2017-05-14 ENCOUNTER — Other Ambulatory Visit: Payer: Medicare Other

## 2017-05-14 ENCOUNTER — Ambulatory Visit: Payer: Medicare Other

## 2017-05-31 NOTE — Telephone Encounter (Signed)
noted 

## 2017-05-31 NOTE — Telephone Encounter (Signed)
Hospice called with a time of death of 1106 am @ home.

## 2017-05-31 DEATH — deceased

## 2017-06-05 ENCOUNTER — Other Ambulatory Visit: Payer: Self-pay | Admitting: Family Medicine

## 2017-06-12 DIAGNOSIS — J189 Pneumonia, unspecified organism: Secondary | ICD-10-CM

## 2017-06-12 DIAGNOSIS — A419 Sepsis, unspecified organism: Secondary | ICD-10-CM

## 2017-08-12 ENCOUNTER — Ambulatory Visit: Payer: Medicare Other

## 2017-11-11 ENCOUNTER — Ambulatory Visit: Payer: Medicare Other

## 2017-12-06 ENCOUNTER — Other Ambulatory Visit: Payer: Self-pay | Admitting: Nurse Practitioner

## 2018-06-26 IMAGING — RF DG SWALLOWING FUNCTION - NRPT [facility]
16 series · 24 of 24 positions shown · non-contrast
Comparison: none

[Series 1: cp_standard · 0.17mm/px · 1 of 1 slices shown (1 of 16)]
[im 1/1]
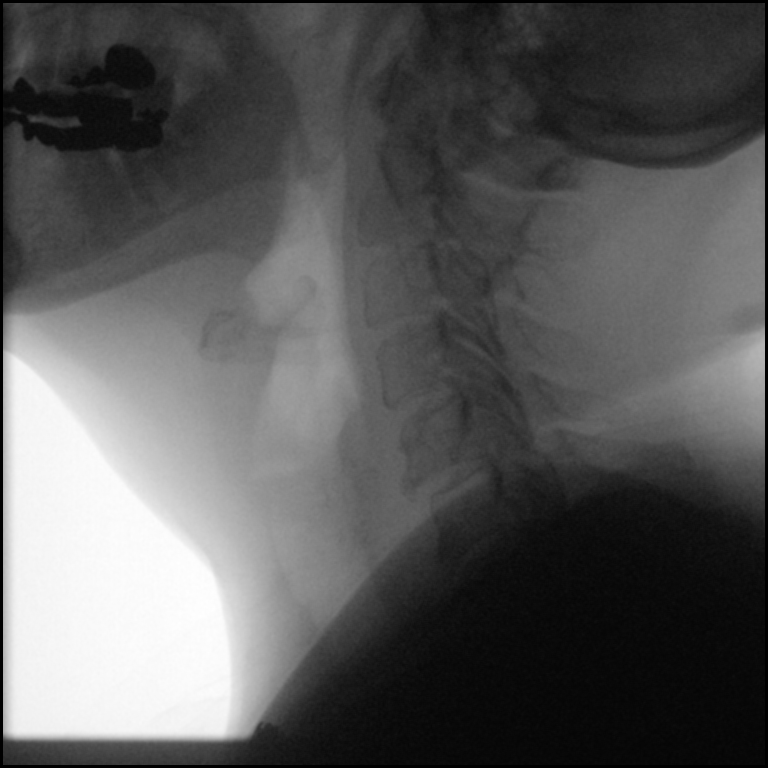

[Series 2: cp_standard · 0.34mm/px · 1 of 71 frames shown (2 of 16)]
[frame 41/71]
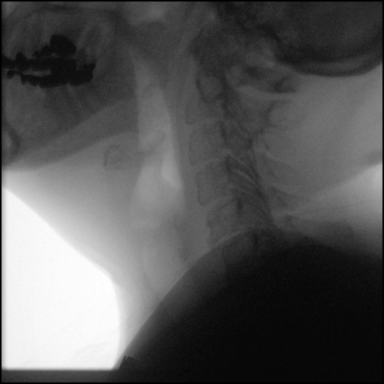

[Series 3: cp_standard · 0.34mm/px · 2 of 154 frames shown (3 of 16)]
[frame 24/154]
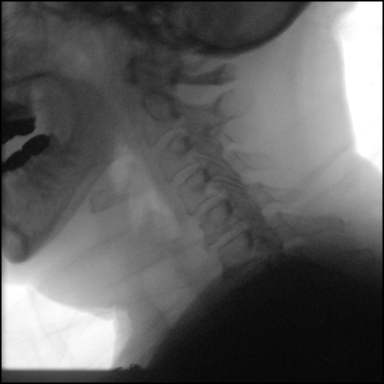
[frame 131/154]
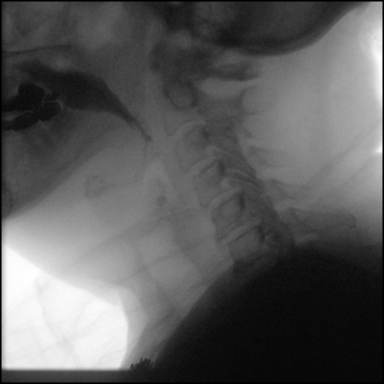

[Series 4: cp_standard · 0.34mm/px · 1 of 106 frames shown (4 of 16)]
[frame 54/106]
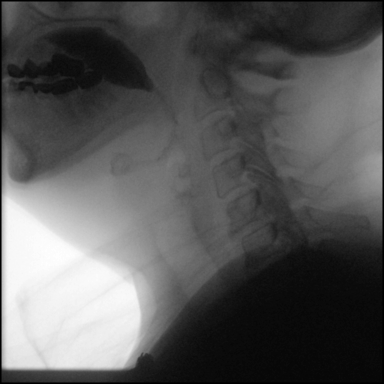

[Series 5: cp_standard · 0.34mm/px · 2 of 54 frames shown (5 of 16)]
[frame 9/54]
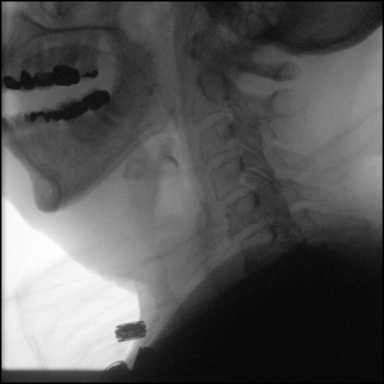
[frame 33/54]
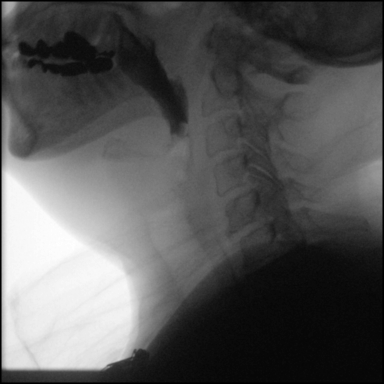

[Series 6: cp_standard · 0.34mm/px · 2 of 64 frames shown (6 of 16)]
[frame 32/64]
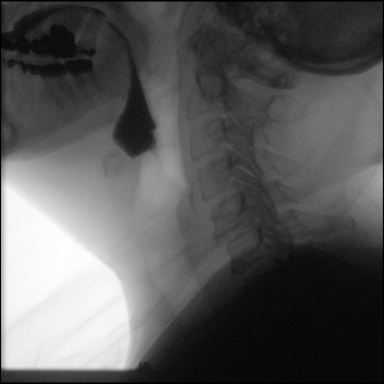
[frame 55/64]
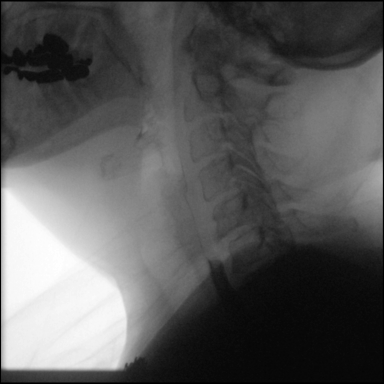

[Series 7: cp_standard · 0.34mm/px · 1 of 23 frames shown (7 of 16)]
[frame 20/23]
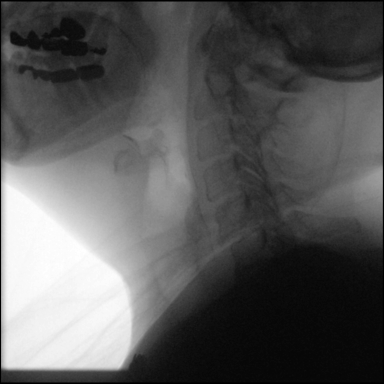

[Series 8: cp_standard · 0.34mm/px · 1 of 81 frames shown (8 of 16)]
[frame 41/81]
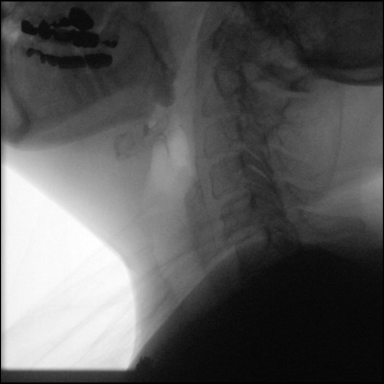

[Series 9: cp_standard · 0.34mm/px · 2 of 49 frames shown (9 of 16)]
[frame 25/49]
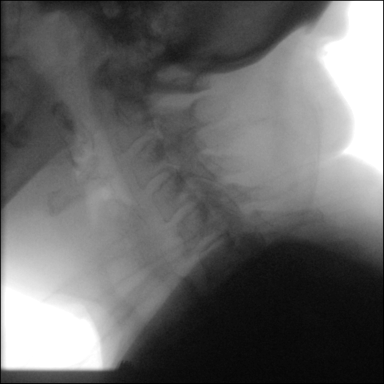
[frame 45/49]
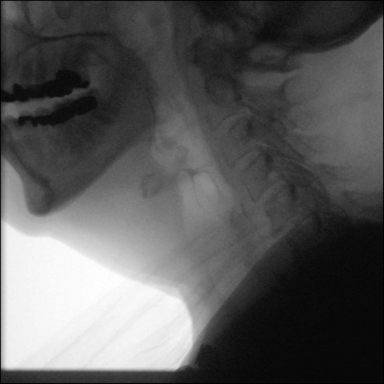

[Series 10: cp_standard · 0.34mm/px · 1 of 9 frames shown (10 of 16)]
[frame 8/9]
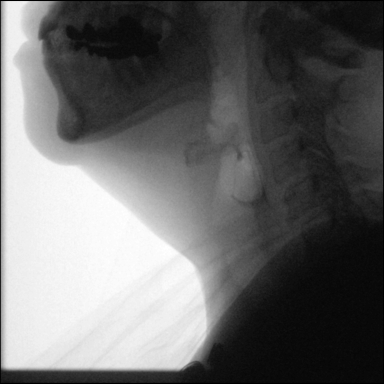

[Series 11: cp_standard · 0.34mm/px · 2 of 128 frames shown (11 of 16)]
[frame 20/128]
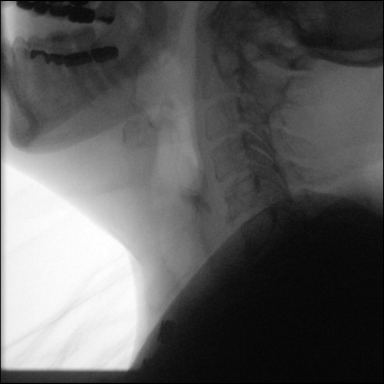
[frame 109/128]
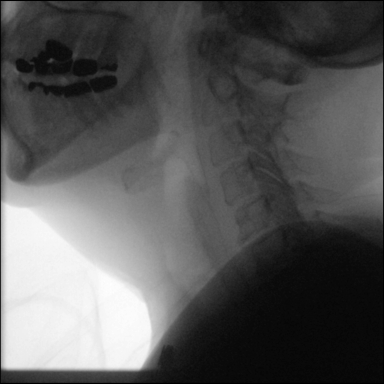

[Series 12: cp_standard · 0.34mm/px · 1 of 165 frames shown (12 of 16)]
[frame 83/165]
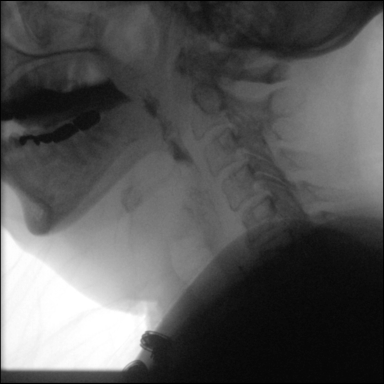

[Series 13: cp_standard · 0.34mm/px · 2 of 78 frames shown (13 of 16)]
[frame 4/78]
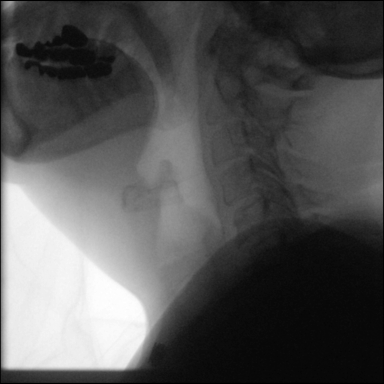
[frame 40/78]
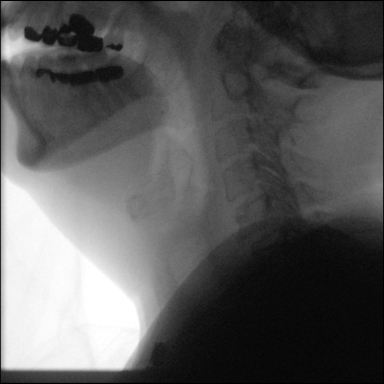

[Series 14: cp_standard · 0.34mm/px · 2 of 45 frames shown (14 of 16)]
[frame 23/45]
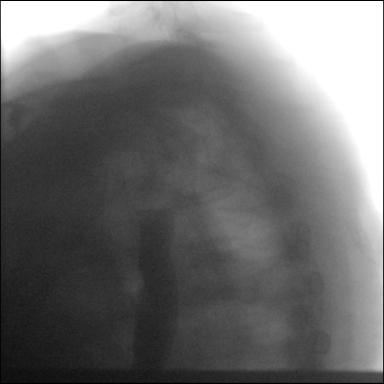
[frame 39/45]
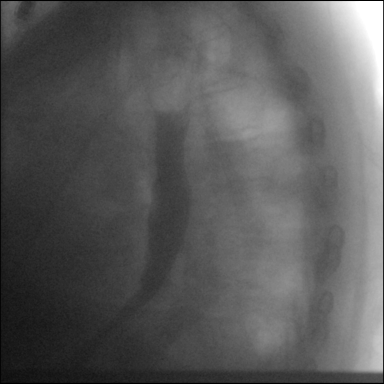

[Series 15: cp_standard · 0.34mm/px · 1 of 73 frames shown (15 of 16)]
[frame 42/73]
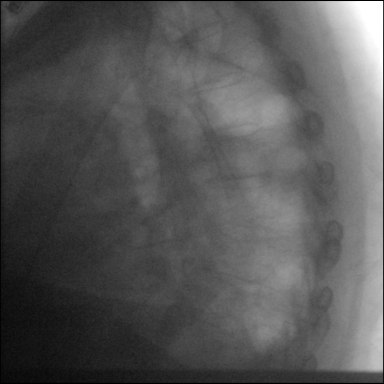

[Series 16: cp_standard · 0.34mm/px · 2 of 20 frames shown (16 of 16)]
[frame 4/20]
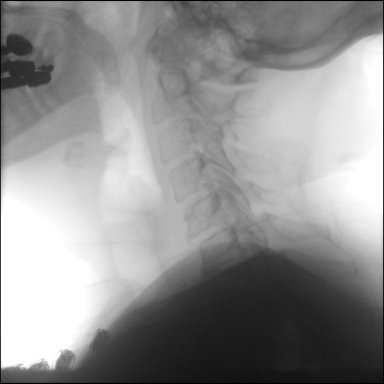
[frame 18/20]
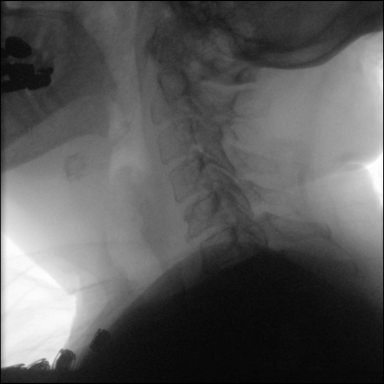

[24 of 24 positions shown; findings below may reference images not displayed]

FLUOROSCOPY FOR SWALLOWING FUNCTION STUDY:
Fluoroscopy was provided for swallowing function study, which was administered by a speech pathologist.  Final results and recommendations from this study are contained within the speech pathology report.

## 2018-07-04 IMAGING — DX DG CHEST 2V
2 series · 2 of 2 positions shown · non-contrast
Comparison: 04/03/2017

CLINICAL DATA: Fevers and recent pneumonia, history of myeloma

EXAM:
CHEST  2 VIEW

[chest lat]
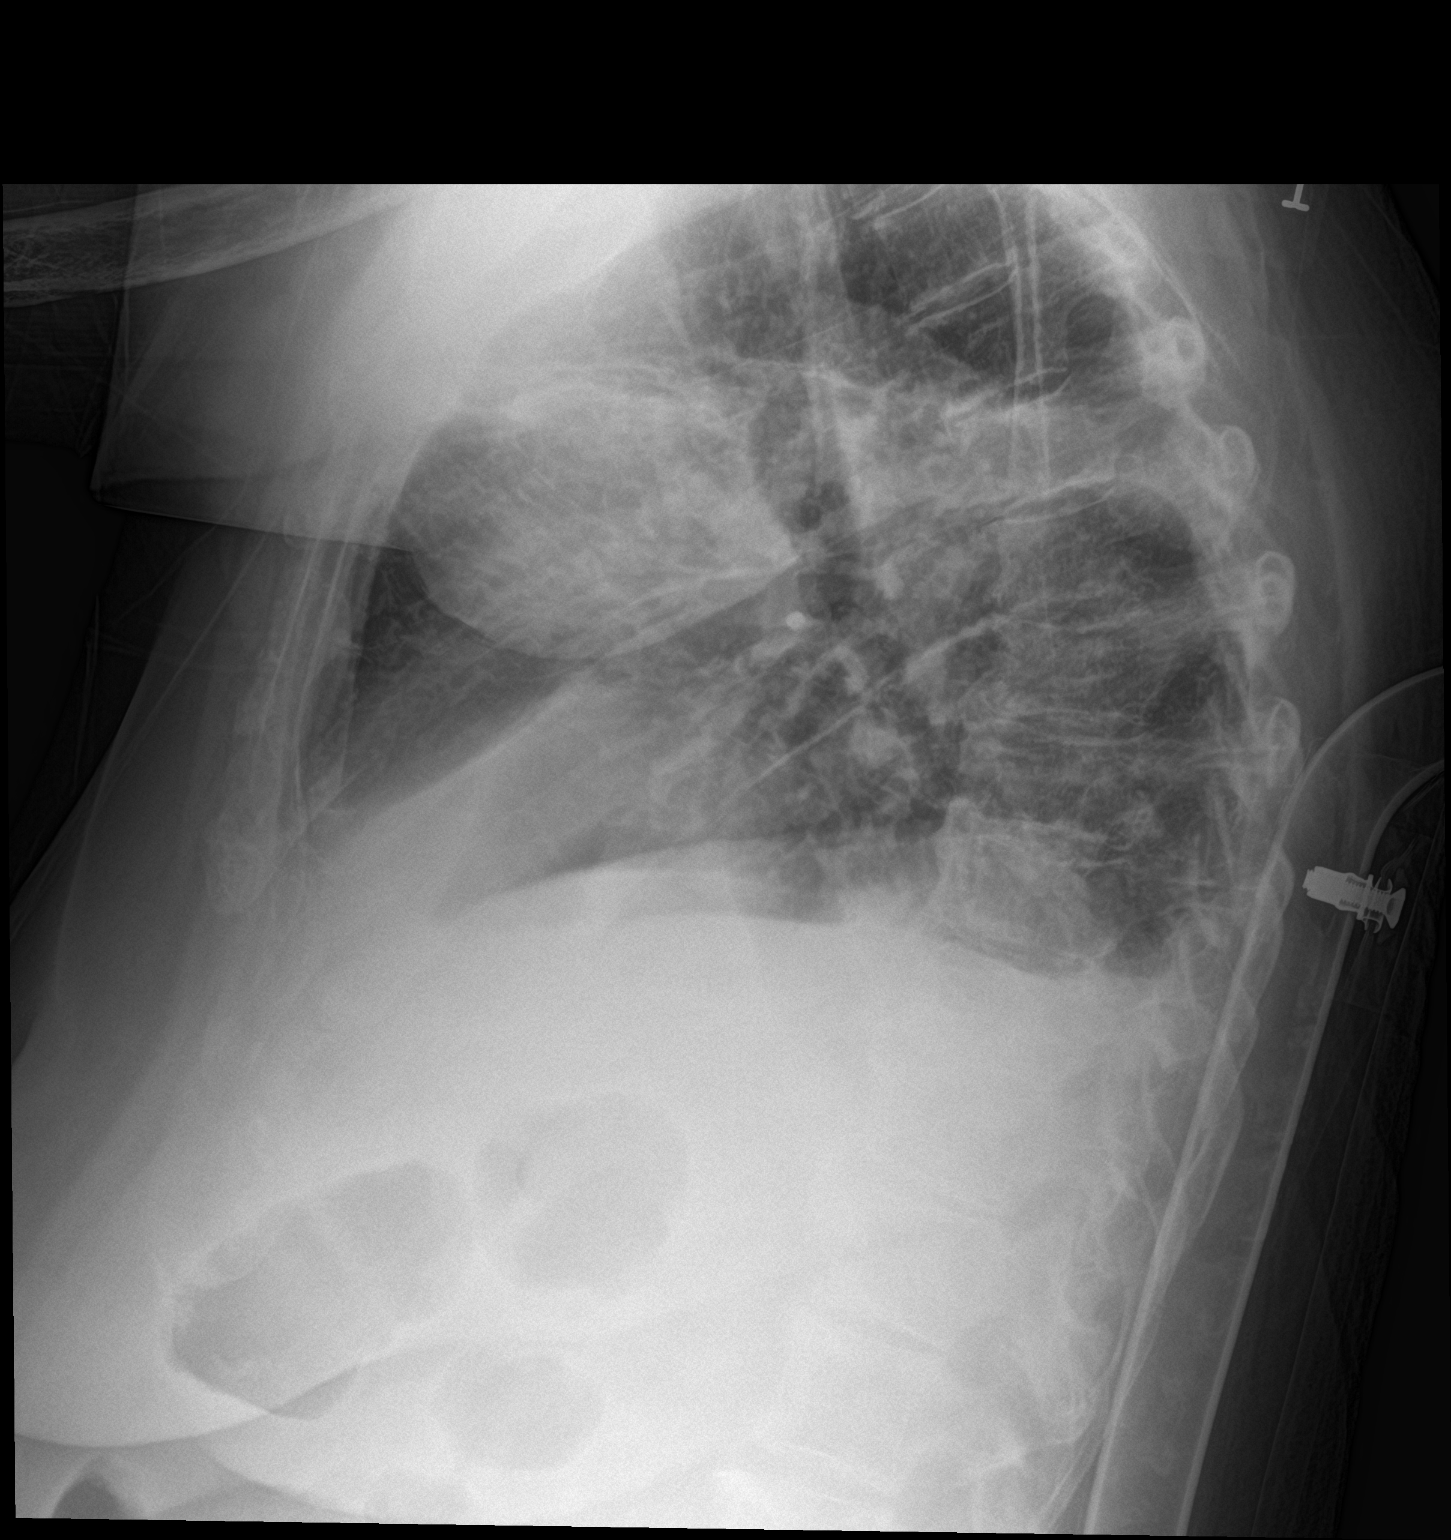

[chest ap]
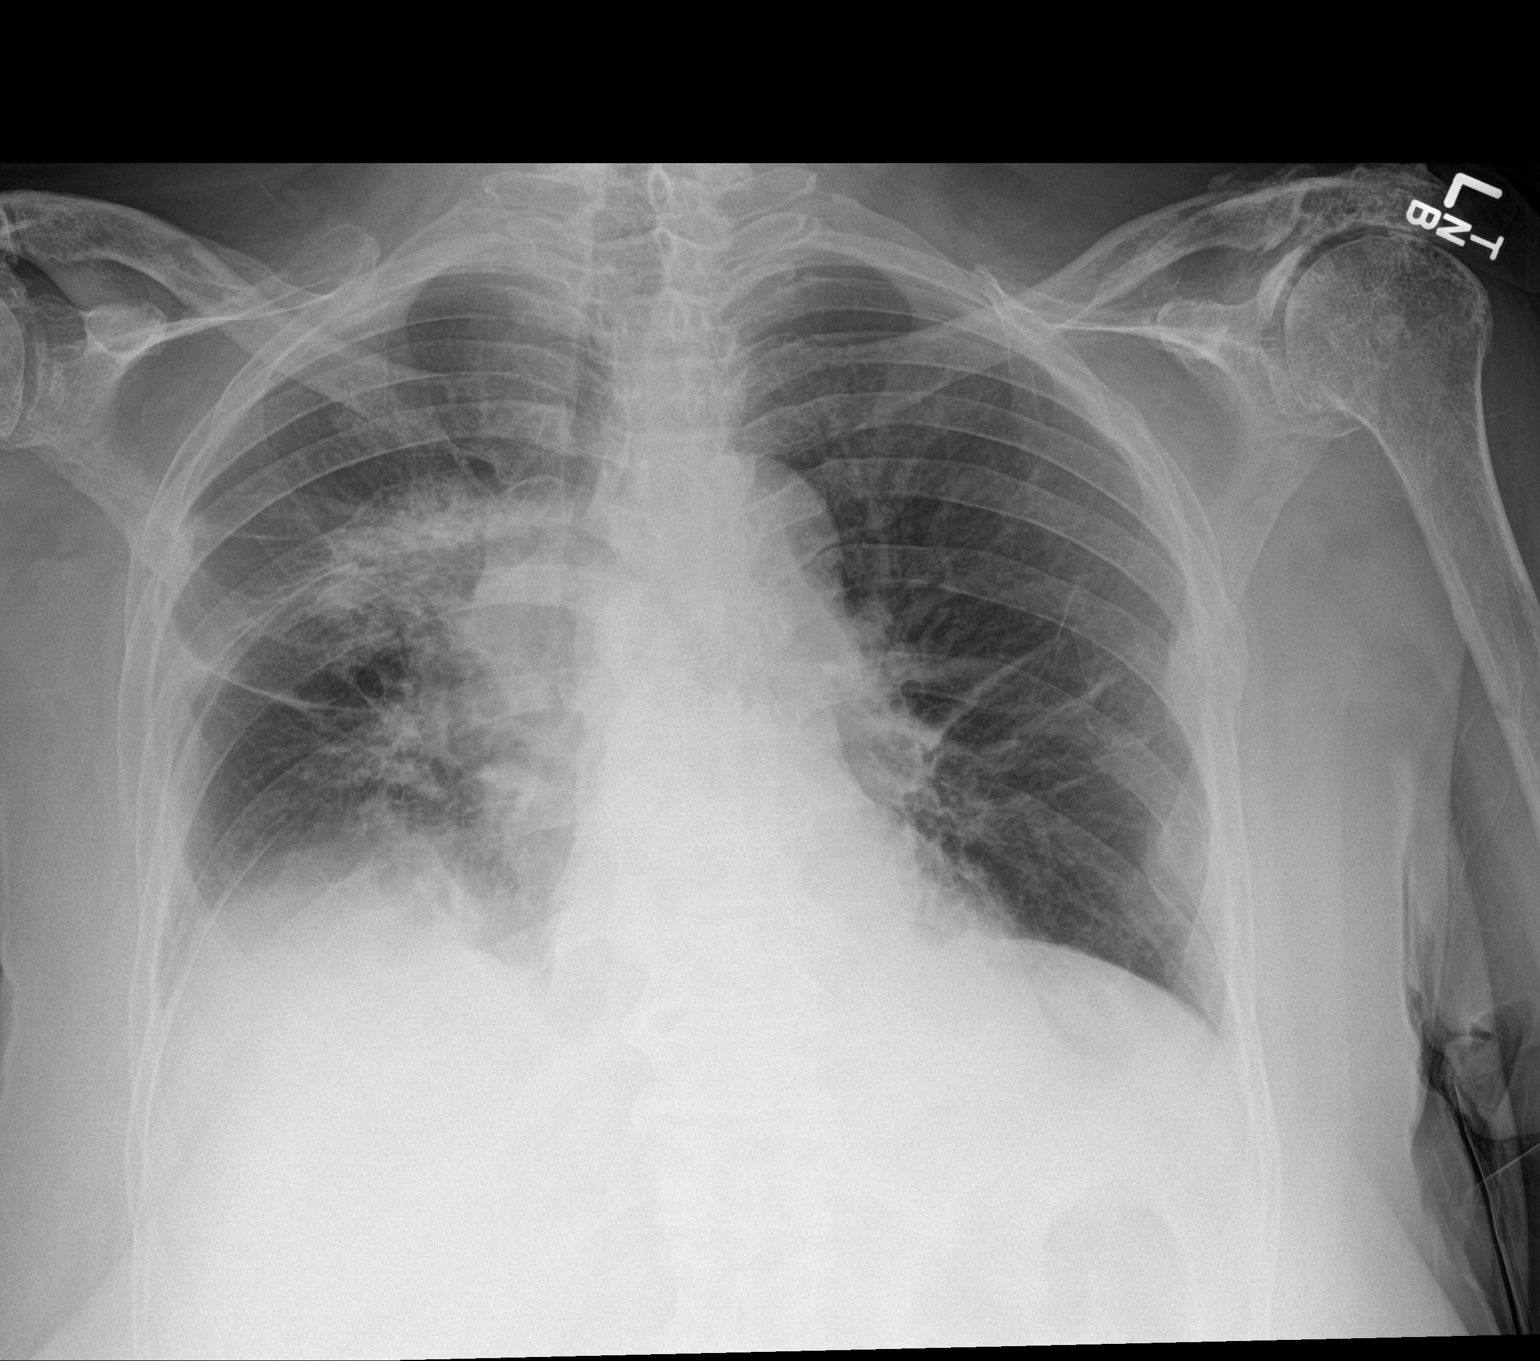

[2 of 2 positions shown; findings below may reference images not displayed]

FINDINGS: Cardiac shadow is stable. The lungs are well aerated bilaterally.
Old rib fractures are again seen bilaterally with interval healing.
Fullness is again noted in the right suprahilar region consistent
with focal infiltrate. No new mass lesion is seen.
IMPRESSION: Stable appearing right suprahilar infiltrate.

Chronic changes consistent with the known history of multiple
myeloma.

## 2020-03-05 ENCOUNTER — Other Ambulatory Visit: Payer: Self-pay | Admitting: Nurse Practitioner
